# Patient Record
Sex: Male | Born: 1940
Health system: Southern US, Community
[De-identification: ages and names within clinical notes are randomized; demographics above are authoritative.]

## PROBLEM LIST (undated history)

## (undated) DIAGNOSIS — T8859XA Other complications of anesthesia, initial encounter: Secondary | ICD-10-CM

## (undated) DIAGNOSIS — H269 Unspecified cataract: Secondary | ICD-10-CM

## (undated) DIAGNOSIS — H409 Unspecified glaucoma: Secondary | ICD-10-CM

## (undated) DIAGNOSIS — M2540 Effusion, unspecified joint: Secondary | ICD-10-CM

## (undated) DIAGNOSIS — M549 Dorsalgia, unspecified: Secondary | ICD-10-CM

## (undated) DIAGNOSIS — E785 Hyperlipidemia, unspecified: Secondary | ICD-10-CM

## (undated) DIAGNOSIS — M1990 Unspecified osteoarthritis, unspecified site: Secondary | ICD-10-CM

## (undated) DIAGNOSIS — Z860100 Personal history of colon polyps, unspecified: Secondary | ICD-10-CM

## (undated) DIAGNOSIS — Z8639 Personal history of other endocrine, nutritional and metabolic disease: Secondary | ICD-10-CM

## (undated) DIAGNOSIS — R3915 Urgency of urination: Secondary | ICD-10-CM

## (undated) DIAGNOSIS — R350 Frequency of micturition: Secondary | ICD-10-CM

## (undated) DIAGNOSIS — K219 Gastro-esophageal reflux disease without esophagitis: Secondary | ICD-10-CM

## (undated) DIAGNOSIS — Z8614 Personal history of Methicillin resistant Staphylococcus aureus infection: Secondary | ICD-10-CM

## (undated) DIAGNOSIS — I2699 Other pulmonary embolism without acute cor pulmonale: Secondary | ICD-10-CM

## (undated) DIAGNOSIS — G5600 Carpal tunnel syndrome, unspecified upper limb: Secondary | ICD-10-CM

## (undated) DIAGNOSIS — I499 Cardiac arrhythmia, unspecified: Secondary | ICD-10-CM

## (undated) DIAGNOSIS — R351 Nocturia: Secondary | ICD-10-CM

## (undated) DIAGNOSIS — I82409 Acute embolism and thrombosis of unspecified deep veins of unspecified lower extremity: Secondary | ICD-10-CM

## (undated) DIAGNOSIS — K5900 Constipation, unspecified: Secondary | ICD-10-CM

## (undated) DIAGNOSIS — I480 Paroxysmal atrial fibrillation: Secondary | ICD-10-CM

## (undated) DIAGNOSIS — G8929 Other chronic pain: Secondary | ICD-10-CM

## (undated) DIAGNOSIS — C801 Malignant (primary) neoplasm, unspecified: Secondary | ICD-10-CM

## (undated) DIAGNOSIS — I10 Essential (primary) hypertension: Secondary | ICD-10-CM

## (undated) DIAGNOSIS — T4145XA Adverse effect of unspecified anesthetic, initial encounter: Secondary | ICD-10-CM

## (undated) DIAGNOSIS — M2550 Pain in unspecified joint: Secondary | ICD-10-CM

## (undated) DIAGNOSIS — J189 Pneumonia, unspecified organism: Secondary | ICD-10-CM

## (undated) HISTORY — DX: Gastro-esophageal reflux disease without esophagitis: K21.9

## (undated) HISTORY — PX: BACK SURGERY: SHX140

## (undated) HISTORY — DX: Dorsalgia, unspecified: M54.9

## (undated) HISTORY — DX: Essential (primary) hypertension: I10

## (undated) HISTORY — DX: Other chronic pain: G89.29

## (undated) HISTORY — DX: Personal history of other endocrine, nutritional and metabolic disease: Z86.39

## (undated) HISTORY — DX: Paroxysmal atrial fibrillation: I48.0

## (undated) HISTORY — DX: Hyperlipidemia, unspecified: E78.5

## (undated) HISTORY — PX: CARPAL TUNNEL RELEASE: SHX101

## (undated) HISTORY — DX: Unspecified glaucoma: H40.9

## (undated) HISTORY — DX: Acute embolism and thrombosis of unspecified deep veins of unspecified lower extremity: I82.409

## (undated) HISTORY — DX: Carpal tunnel syndrome, unspecified upper limb: G56.00

## (undated) HISTORY — DX: Other pulmonary embolism without acute cor pulmonale: I26.99

## (undated) HISTORY — PX: JOINT REPLACEMENT: SHX530

## (undated) HISTORY — PX: HEMORRHOIDECTOMY WITH HEMORRHOID BANDING: SHX5633

## (undated) HISTORY — PX: LASIK: SHX215

## (undated) HISTORY — PX: KNEE SURGERY: SHX244

## (undated) HISTORY — PX: EYE SURGERY: SHX253

## (undated) HISTORY — PX: KNEE ARTHROSCOPY: SUR90

## (undated) SURGERY — EGD (ESOPHAGOGASTRODUODENOSCOPY)
Anesthesia: Moderate Sedation

---

## 1990-05-18 DIAGNOSIS — Z8614 Personal history of Methicillin resistant Staphylococcus aureus infection: Secondary | ICD-10-CM

## 1990-05-18 HISTORY — DX: Personal history of Methicillin resistant Staphylococcus aureus infection: Z86.14

## 1998-02-18 ENCOUNTER — Encounter: Payer: Self-pay | Admitting: *Deleted

## 1998-02-18 ENCOUNTER — Emergency Department (HOSPITAL_COMMUNITY): Admission: EM | Admit: 1998-02-18 | Discharge: 1998-02-18 | Payer: Self-pay | Admitting: Family Medicine

## 1998-02-20 ENCOUNTER — Ambulatory Visit (HOSPITAL_COMMUNITY): Admission: RE | Admit: 1998-02-20 | Discharge: 1998-02-20 | Payer: Self-pay | Admitting: Neurological Surgery

## 1998-03-06 ENCOUNTER — Encounter: Payer: Self-pay | Admitting: Neurological Surgery

## 1998-03-07 ENCOUNTER — Encounter: Payer: Self-pay | Admitting: Neurological Surgery

## 1998-03-07 ENCOUNTER — Inpatient Hospital Stay (HOSPITAL_COMMUNITY): Admission: RE | Admit: 1998-03-07 | Discharge: 1998-03-08 | Payer: Self-pay | Admitting: Neurological Surgery

## 1999-09-23 ENCOUNTER — Encounter: Payer: Self-pay | Admitting: Neurological Surgery

## 1999-09-25 ENCOUNTER — Encounter: Payer: Self-pay | Admitting: Neurological Surgery

## 1999-09-25 ENCOUNTER — Inpatient Hospital Stay (HOSPITAL_COMMUNITY): Admission: RE | Admit: 1999-09-25 | Discharge: 1999-09-26 | Payer: Self-pay | Admitting: Neurological Surgery

## 1999-10-17 ENCOUNTER — Encounter: Admission: RE | Admit: 1999-10-17 | Discharge: 1999-10-17 | Payer: Self-pay | Admitting: Neurological Surgery

## 1999-10-17 ENCOUNTER — Encounter: Payer: Self-pay | Admitting: Neurological Surgery

## 1999-11-26 ENCOUNTER — Encounter: Payer: Self-pay | Admitting: Neurological Surgery

## 1999-11-26 ENCOUNTER — Encounter: Admission: RE | Admit: 1999-11-26 | Discharge: 1999-11-26 | Payer: Self-pay | Admitting: Neurological Surgery

## 1999-12-31 ENCOUNTER — Encounter: Payer: Self-pay | Admitting: Neurological Surgery

## 1999-12-31 ENCOUNTER — Encounter: Admission: RE | Admit: 1999-12-31 | Discharge: 1999-12-31 | Payer: Self-pay | Admitting: Neurological Surgery

## 2000-07-07 ENCOUNTER — Encounter: Admission: RE | Admit: 2000-07-07 | Discharge: 2000-07-07 | Payer: Self-pay | Admitting: Neurological Surgery

## 2000-07-07 ENCOUNTER — Encounter: Payer: Self-pay | Admitting: Neurological Surgery

## 2000-10-01 ENCOUNTER — Ambulatory Visit (HOSPITAL_COMMUNITY): Admission: RE | Admit: 2000-10-01 | Discharge: 2000-10-01 | Payer: Self-pay | Admitting: Internal Medicine

## 2001-01-07 ENCOUNTER — Ambulatory Visit (HOSPITAL_COMMUNITY): Admission: RE | Admit: 2001-01-07 | Discharge: 2001-01-07 | Payer: Self-pay | Admitting: Family Medicine

## 2001-01-07 ENCOUNTER — Encounter: Payer: Self-pay | Admitting: Family Medicine

## 2001-01-21 ENCOUNTER — Encounter: Payer: Self-pay | Admitting: Family Medicine

## 2001-01-21 ENCOUNTER — Ambulatory Visit (HOSPITAL_COMMUNITY): Admission: RE | Admit: 2001-01-21 | Discharge: 2001-01-21 | Payer: Self-pay | Admitting: Family Medicine

## 2001-01-24 ENCOUNTER — Encounter: Payer: Self-pay | Admitting: Family Medicine

## 2001-01-24 ENCOUNTER — Ambulatory Visit (HOSPITAL_COMMUNITY): Admission: RE | Admit: 2001-01-24 | Discharge: 2001-01-24 | Payer: Self-pay | Admitting: Family Medicine

## 2001-02-17 ENCOUNTER — Encounter: Payer: Self-pay | Admitting: Neurological Surgery

## 2001-02-17 ENCOUNTER — Ambulatory Visit (HOSPITAL_COMMUNITY): Admission: RE | Admit: 2001-02-17 | Discharge: 2001-02-17 | Payer: Self-pay | Admitting: Neurological Surgery

## 2001-08-10 ENCOUNTER — Encounter: Payer: Self-pay | Admitting: Neurological Surgery

## 2001-08-10 ENCOUNTER — Ambulatory Visit (HOSPITAL_COMMUNITY): Admission: RE | Admit: 2001-08-10 | Discharge: 2001-08-10 | Payer: Self-pay | Admitting: Neurological Surgery

## 2001-08-16 ENCOUNTER — Encounter: Payer: Self-pay | Admitting: Neurological Surgery

## 2001-08-16 ENCOUNTER — Ambulatory Visit (HOSPITAL_COMMUNITY): Admission: RE | Admit: 2001-08-16 | Discharge: 2001-08-17 | Payer: Self-pay | Admitting: Neurological Surgery

## 2002-10-18 ENCOUNTER — Other Ambulatory Visit: Admission: RE | Admit: 2002-10-18 | Discharge: 2002-10-18 | Payer: Self-pay | Admitting: Dermatology

## 2003-03-27 ENCOUNTER — Encounter (HOSPITAL_COMMUNITY): Admission: RE | Admit: 2003-03-27 | Discharge: 2003-04-26 | Payer: Self-pay | Admitting: Neurological Surgery

## 2003-07-05 ENCOUNTER — Other Ambulatory Visit: Admission: RE | Admit: 2003-07-05 | Discharge: 2003-07-05 | Payer: Self-pay | Admitting: Dermatology

## 2003-12-20 ENCOUNTER — Other Ambulatory Visit: Admission: RE | Admit: 2003-12-20 | Discharge: 2003-12-20 | Payer: Self-pay | Admitting: Dermatology

## 2004-04-27 ENCOUNTER — Emergency Department (HOSPITAL_COMMUNITY): Admission: EM | Admit: 2004-04-27 | Discharge: 2004-04-28 | Payer: Self-pay | Admitting: Emergency Medicine

## 2004-05-07 ENCOUNTER — Ambulatory Visit: Payer: Self-pay | Admitting: Cardiology

## 2004-05-08 ENCOUNTER — Encounter: Payer: Self-pay | Admitting: Internal Medicine

## 2004-05-08 ENCOUNTER — Ambulatory Visit (HOSPITAL_COMMUNITY): Admission: RE | Admit: 2004-05-08 | Discharge: 2004-05-08 | Payer: Self-pay | Admitting: Cardiology

## 2004-05-08 ENCOUNTER — Ambulatory Visit: Payer: Self-pay | Admitting: Cardiology

## 2004-06-11 ENCOUNTER — Ambulatory Visit: Payer: Self-pay | Admitting: Cardiology

## 2004-10-18 ENCOUNTER — Observation Stay (HOSPITAL_COMMUNITY): Admission: EM | Admit: 2004-10-18 | Discharge: 2004-10-19 | Payer: Self-pay | Admitting: Emergency Medicine

## 2005-01-29 ENCOUNTER — Inpatient Hospital Stay (HOSPITAL_COMMUNITY): Admission: RE | Admit: 2005-01-29 | Discharge: 2005-01-30 | Payer: Self-pay | Admitting: Neurological Surgery

## 2005-05-22 ENCOUNTER — Ambulatory Visit (HOSPITAL_COMMUNITY): Admission: RE | Admit: 2005-05-22 | Discharge: 2005-05-22 | Payer: Self-pay | Admitting: Family Medicine

## 2005-09-01 ENCOUNTER — Inpatient Hospital Stay (HOSPITAL_COMMUNITY): Admission: RE | Admit: 2005-09-01 | Discharge: 2005-09-03 | Payer: Self-pay | Admitting: Neurological Surgery

## 2005-09-16 ENCOUNTER — Encounter: Payer: Self-pay | Admitting: Neurological Surgery

## 2006-04-16 ENCOUNTER — Ambulatory Visit (HOSPITAL_COMMUNITY): Admission: RE | Admit: 2006-04-16 | Discharge: 2006-04-16 | Payer: Self-pay | Admitting: Neurological Surgery

## 2006-05-18 DIAGNOSIS — I2699 Other pulmonary embolism without acute cor pulmonale: Secondary | ICD-10-CM

## 2006-05-18 HISTORY — PX: CARDIAC CATHETERIZATION: SHX172

## 2006-05-18 HISTORY — DX: Other pulmonary embolism without acute cor pulmonale: I26.99

## 2006-05-25 ENCOUNTER — Ambulatory Visit: Payer: Self-pay | Admitting: Cardiology

## 2006-05-25 ENCOUNTER — Inpatient Hospital Stay (HOSPITAL_COMMUNITY): Admission: RE | Admit: 2006-05-25 | Discharge: 2006-05-31 | Payer: Self-pay | Admitting: Neurological Surgery

## 2006-05-25 ENCOUNTER — Ambulatory Visit: Payer: Self-pay | Admitting: Emergency Medicine

## 2006-06-14 ENCOUNTER — Ambulatory Visit: Payer: Self-pay | Admitting: Cardiology

## 2006-09-17 ENCOUNTER — Ambulatory Visit: Payer: Self-pay | Admitting: Cardiology

## 2006-12-06 ENCOUNTER — Ambulatory Visit: Payer: Self-pay | Admitting: Cardiology

## 2006-12-14 ENCOUNTER — Ambulatory Visit: Payer: Self-pay | Admitting: Cardiology

## 2007-02-09 ENCOUNTER — Ambulatory Visit: Payer: Self-pay | Admitting: Cardiology

## 2007-03-19 DIAGNOSIS — I82409 Acute embolism and thrombosis of unspecified deep veins of unspecified lower extremity: Secondary | ICD-10-CM

## 2007-03-19 HISTORY — DX: Acute embolism and thrombosis of unspecified deep veins of unspecified lower extremity: I82.409

## 2007-04-05 ENCOUNTER — Inpatient Hospital Stay (HOSPITAL_COMMUNITY): Admission: EM | Admit: 2007-04-05 | Discharge: 2007-04-08 | Payer: Self-pay | Admitting: Emergency Medicine

## 2007-04-05 ENCOUNTER — Ambulatory Visit: Payer: Self-pay | Admitting: Cardiology

## 2007-04-06 ENCOUNTER — Encounter: Payer: Self-pay | Admitting: Internal Medicine

## 2007-05-25 ENCOUNTER — Encounter: Admission: RE | Admit: 2007-05-25 | Discharge: 2007-05-25 | Payer: Self-pay | Admitting: Neurological Surgery

## 2007-07-18 ENCOUNTER — Emergency Department (HOSPITAL_COMMUNITY): Admission: EM | Admit: 2007-07-18 | Discharge: 2007-07-18 | Payer: Self-pay | Admitting: Emergency Medicine

## 2007-10-20 ENCOUNTER — Ambulatory Visit (HOSPITAL_COMMUNITY): Admission: RE | Admit: 2007-10-20 | Discharge: 2007-10-20 | Payer: Self-pay | Admitting: Family Medicine

## 2009-05-29 ENCOUNTER — Encounter: Payer: Self-pay | Admitting: Internal Medicine

## 2009-06-18 ENCOUNTER — Encounter: Payer: Self-pay | Admitting: Internal Medicine

## 2009-06-20 ENCOUNTER — Encounter: Payer: Self-pay | Admitting: Internal Medicine

## 2009-06-28 ENCOUNTER — Encounter: Payer: Self-pay | Admitting: Internal Medicine

## 2009-11-01 ENCOUNTER — Encounter: Payer: Self-pay | Admitting: Internal Medicine

## 2009-11-26 ENCOUNTER — Ambulatory Visit (HOSPITAL_COMMUNITY): Admission: RE | Admit: 2009-11-26 | Discharge: 2009-11-26 | Payer: Self-pay | Admitting: Family Medicine

## 2009-11-28 ENCOUNTER — Encounter: Payer: Self-pay | Admitting: Internal Medicine

## 2009-12-06 ENCOUNTER — Ambulatory Visit: Payer: Self-pay | Admitting: Internal Medicine

## 2009-12-06 DIAGNOSIS — I4891 Unspecified atrial fibrillation: Secondary | ICD-10-CM | POA: Insufficient documentation

## 2009-12-06 DIAGNOSIS — I10 Essential (primary) hypertension: Secondary | ICD-10-CM | POA: Insufficient documentation

## 2009-12-06 DIAGNOSIS — I4819 Other persistent atrial fibrillation: Secondary | ICD-10-CM | POA: Insufficient documentation

## 2009-12-17 ENCOUNTER — Encounter: Payer: Self-pay | Admitting: Internal Medicine

## 2009-12-23 ENCOUNTER — Encounter: Payer: Self-pay | Admitting: Internal Medicine

## 2009-12-26 ENCOUNTER — Encounter: Payer: Self-pay | Admitting: Internal Medicine

## 2010-01-03 ENCOUNTER — Encounter: Payer: Self-pay | Admitting: Internal Medicine

## 2010-01-10 ENCOUNTER — Encounter: Payer: Self-pay | Admitting: Internal Medicine

## 2010-01-15 ENCOUNTER — Encounter: Payer: Self-pay | Admitting: Internal Medicine

## 2010-01-16 ENCOUNTER — Encounter: Payer: Self-pay | Admitting: Internal Medicine

## 2010-01-23 ENCOUNTER — Encounter: Payer: Self-pay | Admitting: Cardiology

## 2010-01-23 ENCOUNTER — Ambulatory Visit: Payer: Self-pay | Admitting: Cardiology

## 2010-01-23 ENCOUNTER — Ambulatory Visit (HOSPITAL_COMMUNITY): Admission: RE | Admit: 2010-01-23 | Discharge: 2010-01-23 | Payer: Self-pay | Admitting: Cardiology

## 2010-01-24 ENCOUNTER — Ambulatory Visit: Payer: Self-pay | Admitting: Internal Medicine

## 2010-01-24 ENCOUNTER — Ambulatory Visit (HOSPITAL_COMMUNITY): Admission: RE | Admit: 2010-01-24 | Discharge: 2010-01-25 | Payer: Self-pay | Admitting: Internal Medicine

## 2010-01-24 HISTORY — PX: OTHER SURGICAL HISTORY: SHX169

## 2010-03-27 ENCOUNTER — Ambulatory Visit: Payer: Self-pay | Admitting: Internal Medicine

## 2010-04-17 ENCOUNTER — Encounter: Payer: Self-pay | Admitting: Internal Medicine

## 2010-06-10 ENCOUNTER — Ambulatory Visit (HOSPITAL_COMMUNITY)
Admission: RE | Admit: 2010-06-10 | Discharge: 2010-06-10 | Payer: Self-pay | Source: Home / Self Care | Attending: Family Medicine | Admitting: Family Medicine

## 2010-06-17 ENCOUNTER — Ambulatory Visit (HOSPITAL_COMMUNITY)
Admission: RE | Admit: 2010-06-17 | Discharge: 2010-06-17 | Payer: Self-pay | Source: Home / Self Care | Attending: Family Medicine | Admitting: Family Medicine

## 2010-06-19 NOTE — Medication Information (Signed)
Summary: [facility] Medicine Physician Orders/INR Results   Physician Orders/INR Results   Imported By: Roderic Ovens 01/03/2010 10:41:11  _____________________________________________________________________  External Attachment:    Type:   Image     Comment:   External Document

## 2010-06-19 NOTE — Letter (Signed)
Summary: [facility]   [facility]   Imported By: Roderic Ovens 12/24/2009 15:33:30  _____________________________________________________________________  External Attachment:    Type:   Image     Comment:   External Document

## 2010-06-19 NOTE — Assessment & Plan Note (Signed)
Summary: eph/s/p rfca in 8 weeks/mt   Visit Type:  Follow-up Referring Provider:  Dr Caprice Kluver Primary Provider:  Dr Lilyan Punt   History of Present Illness: The patient presents today for routine electrophysiology followup. He reports doing very well since his afib ablation.  He denies any further episodes of symptomatic afib.  He is unaware of postprocedure complications.  The patient denies symptoms of palpitations, chest pain, shortness of breath, orthopnea, PND, lower extremity edema, dizziness, presyncope, syncope, or neurologic sequela.  He has occasional "nervousness" which he attributes to caffeine.  The patient is tolerating medications without difficulties and is otherwise without complaint today.   Current Medications (verified): 1)  Ramipril 10 Mg Caps (Ramipril) .... Take One Capsule By Mouth Two Times A Day 2)  Warfarin Sodium 5 Mg Tabs (Warfarin Sodium) .... Use As Directed 3)  Sotalol Hcl 80 Mg Tabs (Sotalol Hcl) .... Take 1  Tablet By Mouth Twice A Day 4)  Indapamide 1.25 Mg Tabs (Indapamide) .... Take One Tablet By Mouth Once Daily. 5)  Oxycodone Hcl 10 Mg Tabs (Oxycodone Hcl) .... Uad  Allergies (verified): 1)  ! Sulfa  Past History:  Past Medical History:  1. Paroxysmal atrial fibrillation s/p PVI 9/11  2. He also has a history of hyperglycemia.   3. Hyperlipidemia.   4. Hypertension.   5. Chronic back pain with numerous back surgeries.   6. GERD  7. Anxiety  8. DVT 03/2007 and PTE  Past Surgical History: cervical and lumbar surgery L knee surgery S/p PVI 9/11  Social History: Reviewed history from 12/06/2009 and no changes required. Pt lives in [place] with spouse.  He does not smoke; he quit over 30 years ago. He has a h/o heavy ETOH but now rarely drinks.  He is married.  He does have children.  He is retired.      Vital Signs:  Patient profile:   70 year old male Height:      66 inches Weight:      197 pounds BMI:     31.91 Pulse rate:    56 / minute BP sitting:   140 / 90  (left arm)  Vitals Entered By: Laurance Flatten CMA (March 27, 2010 12:02 PM)  Physical Exam  General:  Well developed, well nourished, in no acute distress. Head:  normocephalic and atraumatic Eyes:  PERRLA/EOM intact; conjunctiva and lids normal. Mouth:  Teeth, gums and palate normal. Oral mucosa normal. Neck:  Neck supple, no JVD. No masses, thyromegaly or abnormal cervical nodes. Lungs:  Clear bilaterally to auscultation and percussion. Heart:  Non-displaced PMI, chest non-tender; regular rate and rhythm, S1, S2 without murmurs, rubs or gallops. Carotid upstroke normal, no bruit. Normal abdominal aortic size, no bruits. Femorals normal pulses, no bruits. Pedals normal pulses. No edema, no varicosities. Abdomen:  Bowel sounds positive; abdomen soft and non-tender without masses, organomegaly, or hernias noted. No hepatosplenomegaly. Msk:  Back normal, normal gait. Muscle strength and tone normal. Pulses:  pulses normal in all 4 extremities Extremities:  No clubbing or cyanosis. Neurologic:  Alert and oriented x 3.   EKG  Procedure date:  03/27/2010  Findings:      sinus bradycardia 56 bpm, PR 172., QTc 424   Impression & Recommendations:  Problem # 1:  ATRIAL FIBRILLATION, PAROXYSMAL (ICD-427.31) doing very well s/p afib ablation without recurrence or procedure related problems.  He is pleased with results thus far. The importance of avoidance of ETOH and significant caffeine were stressed  today. Continue coumadin and sotalol for now.  We will consider stopping sotalol if he has no further afib upon return.  Problem # 2:  HYPERTENSION, BENIGN (ICD-401.1) stable no changes today  Patient Instructions: 1)  Your physician recommends that you schedule a follow-up appointment in: 3 months with Dr Johney Frame 2)  Your physician recommends that you continue on your current medications as directed. Please refer to the Current Medication list given to  you today.

## 2010-06-19 NOTE — Assessment & Plan Note (Signed)
Summary: nep/afib/jml   Visit Type:  Initial Consult Referring Provider:  Dr Caprice Kluver Primary Provider:  Dr Lilyan Punt  CC:  afib.  History of Present Illness: [name] is a pleasant 70 yo WM with a h/o paroxysmal atrial fibrillation who presents today for EP consultation.  He reports initially being diagnosed with atrial fibrillation 2008 after back surgery (but feels that he has likely had afib for months before).  He states at that time that he would have episodes of tachycardia related to ETOH use.  He has not quit drinking heavy ETOH.  Despite ETOH cessation, he reports increasing frequency and duration of atrial fibrillation.  He has been followed by Dr Clarene Duke and has been treated with coumadin.  He takes Sotalol 80mg  two times a day.  He has previously tried metoprolol.  Despite medical therapy, he reports episodes of afib  1-2times per week.  Episodes typically last less than 24 hours but may last several days.  He has not previously required cardioversion.  During afib, he reports tachy palpitations, head "pounding", fatigue, and decreased exercise tolerance. The patient denies symptoms of chest pain, shortness of breath, orthopnea, PND, lower extremity edema, dizziness, presyncope, syncope, or neurologic sequela. The patient is tolerating medications without difficulties and is otherwise without complaint today.    Allergies (verified): No Known Drug Allergies  Past History:  Family History: Last updated: 12/06/2009   Hypertension and heart disease, but not of  hypercoagulability  Social History: Last updated: 12/06/2009 Pt lives in [place] with spouse.  He does not smoke; he quit over 30 years ago. He has a h/o heavy ETOH but now rarely drinks.  He is married.  He does have children.  He is retired.      Past Medical History:  1. Paroxysmal atrial fibrillation.   2. He also has a history of hyperglycemia.   3. Hyperlipidemia.   4. Hypertension.   5. Chronic back pain  with numerous back surgeries.   6. GERD  7. Anxiety  8. DVT 03/2007 and PTE  Past Surgical History: cervical and lumbar surgery L knee surgery  Family History:   Hypertension and heart disease, but not of  hypercoagulability  Social History: Pt lives in [place] with spouse.  He does not smoke; he quit over 30 years ago. He has a h/o heavy ETOH but now rarely drinks.  He is married.  He does have children.  He is retired.      Review of Systems       All systems are reviewed and negative except as listed in the HPI.   Vital Signs:  Patient profile:   70 year old male Height:      66 inches Weight:      196 pounds BMI:     31.75 Pulse rate:   55 / minute BP sitting:   152 / 80  (left arm)  Vitals Entered By: Laurance Flatten CMA (December 06, 2009 11:40 AM)  Physical Exam  General:  Well developed, well nourished, in no acute distress. Head:  normocephalic and atraumatic Eyes:  PERRLA/EOM intact; conjunctiva and lids normal. Mouth:  Teeth, gums and palate normal. Oral mucosa normal. Neck:  Neck supple, no JVD. No masses, thyromegaly or abnormal cervical nodes. Lungs:  Clear bilaterally to auscultation and percussion. Heart:  Non-displaced PMI, chest non-tender; regular rate and rhythm, S1, S2 without murmurs, rubs or gallops. Carotid upstroke normal, no bruit. Normal abdominal aortic size, no bruits. Femorals normal pulses, no  bruits. Pedals normal pulses. No edema, no varicosities. Abdomen:  Bowel sounds positive; abdomen soft and non-tender without masses, organomegaly, or hernias noted. No hepatosplenomegaly. Msk:  Back normal, normal gait. Muscle strength and tone normal. Pulses:  pulses normal in all 4 extremities Extremities:  No clubbing or cyanosis. Neurologic:  Alert and oriented x 3. Skin:  Intact without lesions or rashes. Cervical Nodes:  no significant adenopathy Psych:  Normal affect.   EKG  Procedure date:  12/06/2009  Findings:      sinus bradycardia 55  bpm,k PR 178, Qtc 380, during rhythm strip, the patient has salvos of afib and nonsustained atria tachycardia as well as a more regular tachycardia which interestingly ends in an 'A' not 'V'.   Echocardiogram  Procedure date:  11/28/2009  Findings:      LVEF >55%, nl LV wall motion,  LA size 41mm, nl RA size, mild to moderate [name], mild TR  Event Monitor  Procedure date:  12/05/2009  Findings:      predominant rhythm is sinus nonsustained afib and atrial tachycardia are observed  Comments:      monitor placed by Dr Clarene Duke 7/14-7/21/11   Nuclear Study  Procedure date:  06/20/2009  Findings:      Persantine Myoview performed by Dr Mindi Junker Little  EF 62%,  diaphragmatic attenuation, low risk study  Impression & Recommendations:  Problem # 1:  ATRIAL FIBRILLATION, PAROXYSMAL (ICD-427.31) The patient has very symptomatic paroxysmal atrial fibrillation.  He is having salvos of atrial tachycardia and nonsustained afib in the office today.  He has failed medical therapy with both metoprolol and sotalol.  He is chronically anticoagulated with coumadin.  His CHADS2 score is 1, however, he also has a h/o DVT and PTE in 2008. Therapeutic strategies for afib including medicine and ablation were discussed in detail with the patient today. Risk, benefits, and alternatives to EP study and radiofrequency ablation for afib were also discussed in detail today. These risks include but are not limited to stroke, bleeding, vascular damage, tamponade, perforation, damage to the esophagus, lungs, and other structures, pulmonary vein stenosis, worsening renal function, and death. The patient understands these risk and wishes to proceed.  We will schedule afib ablation at the next available time.  No medicine changes are made today.  Problem # 2:  HYPERTENSION, BENIGN (ICD-401.1) stable no changes today

## 2010-06-19 NOTE — Letter (Signed)
Summary: ELectrophysiology/Ablation Procedure Instructions  [facility]  [address]   [place], [place] [address]   Phone: [phone]  Fax: [phone]     Electrophysiology/Ablation Procedure Instructions    You are scheduled for a(n) afib ablation on 01/24/10 at 7:30am with Dr. Johney Frame.  1.  Please come to the Short Stay Center at [facility] at 5:30am on the day of your procedure.  2.  Come prepared to stay overnight.   Please bring your insurance cards and a list of your medications.  3.  Come to the Dr Fletcher Anon office on 01/16/10 for lab work.    You do not have to be fasting.  4.  Do not have anything to eat or drink after midnight the night before your procedure.  5.    All of your remaining medications may be taken with a small amount of water.  6.  Educational material received:   Ablation   * Occasionally, EP studies and ablations can become lengthy.  Please make your family aware of this before your procedure starts.  Average time ranges from 2-8 hours for EP studies/ablations.  Your physician will locate your family after the procedure with the results.  * If you have any questions after you get home, please call the office at [phone]. [name]   TEE on 01/23/10 with Dr Jens Som Be at hospital at 10:00 at [facility] Nothing to eat or drink after midnight.  TEE is at 11:00

## 2010-06-19 NOTE — Progress Notes (Signed)
Summary: [facility] Note   [facility]   Imported By: Roderic Ovens 12/24/2009 15:28:09  _____________________________________________________________________  External Attachment:    Type:   Image     Comment:   External Document

## 2010-06-20 NOTE — Letter (Signed)
Summary: [facility] Note   [facility] Note   Imported By: Roderic Ovens 02/07/2010 14:36:27  _____________________________________________________________________  External Attachment:    Type:   Image     Comment:   External Document

## 2010-06-20 NOTE — Letter (Signed)
Summary: [facility] Note   [facility] Note   Imported By: Roderic Ovens 12/24/2009 15:29:58  _____________________________________________________________________  External Attachment:    Type:   Image     Comment:   External Document

## 2010-06-20 NOTE — Letter (Signed)
Summary: [facility] Note   [facility] Note   Imported By: Roderic Ovens 12/24/2009 15:30:17  _____________________________________________________________________  External Attachment:    Type:   Image     Comment:   External Document

## 2010-06-25 ENCOUNTER — Encounter: Payer: Self-pay | Admitting: Internal Medicine

## 2010-06-25 ENCOUNTER — Ambulatory Visit (INDEPENDENT_AMBULATORY_CARE_PROVIDER_SITE_OTHER): Payer: Medicare Other | Admitting: Internal Medicine

## 2010-06-25 DIAGNOSIS — I4891 Unspecified atrial fibrillation: Secondary | ICD-10-CM

## 2010-06-25 DIAGNOSIS — I10 Essential (primary) hypertension: Secondary | ICD-10-CM

## 2010-06-25 NOTE — Letter (Signed)
Summary: [facility] Office Note   [facility] Office Note   Imported By: Roderic Ovens 06/19/2010 12:07:15  _____________________________________________________________________  External Attachment:    Type:   Image     Comment:   External Document

## 2010-07-03 NOTE — Assessment & Plan Note (Signed)
Summary: f50m/rov./sl/jml   Visit Type:  Follow-up Referring Provider:  Dr Caprice Kluver Primary Provider:  Dr Lilyan Punt   History of Present Illness: The patient presents today for routine electrophysiology followup. He reports doing very well since his afib ablation.  He denies any further episodes of symptomatic afib.  The patient denies symptoms of palpitations, shortness of breath, orthopnea, PND, lower extremity edema, dizziness, presyncope, syncope, or neurologic sequela.   He reports that in mid December that he had a "forceful" heart beat with chest pain lasting only 20 seconds.  He has had no further episodes and has done very well since.   The patient is tolerating medications without difficulties and is otherwise without complaint today.   Current Medications (verified): 1)  Ramipril 10 Mg Caps (Ramipril) .... Take One Capsule By Mouth Two Times A Day 2)  Warfarin Sodium 5 Mg Tabs (Warfarin Sodium) .... Use As Directed 3)  Sotalol Hcl 80 Mg Tabs (Sotalol Hcl) .... Take 1  Tablet By Mouth Twice A Day 4)  Indapamide 1.25 Mg Tabs (Indapamide) .... Take One Tablet By Mouth Once Daily. 5)  Oxycodone Hcl 10 Mg Tabs (Oxycodone Hcl) .... Uad 6)  Pravastatin Sodium 20 Mg Tabs (Pravastatin Sodium) .... Take One Tablet By Mouth Daily At Bedtime  Allergies: 1)  ! Sulfa  Past History:  Past Medical History: Reviewed history from 03/27/2010 and no changes required.  1. Paroxysmal atrial fibrillation s/p PVI 9/11  2. He also has a history of hyperglycemia.   3. Hyperlipidemia.   4. Hypertension.   5. Chronic back pain with numerous back surgeries.   6. GERD  7. Anxiety  8. DVT 03/2007 and PTE  Past Surgical History: Reviewed history from 03/27/2010 and no changes required. cervical and lumbar surgery L knee surgery S/p PVI 9/11  Social History: Reviewed history from 12/06/2009 and no changes required. Pt lives in [place] with spouse.  He does not smoke; he quit over 30 years  ago. He has a h/o heavy ETOH but now rarely drinks.  He is married.  He does have children.  He is retired.      Review of Systems       All systems are reviewed and negative except as listed in the HPI.   Vital Signs:  Patient profile:   70 year old male Height:      66 inches Weight:      201 pounds BMI:     32.56 Pulse rate:   63 / minute BP sitting:   150 / 102  (left arm)  Vitals Entered By: Laurance Flatten CMA (June 25, 2010 11:36 AM)  Physical Exam  General:  Well developed, well nourished, in no acute distress. Head:  normocephalic and atraumatic Eyes:  PERRLA/EOM intact; conjunctiva and lids normal. Mouth:  Teeth, gums and palate normal. Oral mucosa normal. Neck:  supple Lungs:  Clear bilaterally to auscultation and percussion. Heart:  Non-displaced PMI, chest non-tender; regular rate and rhythm, S1, S2 without murmurs, rubs or gallops. Carotid upstroke normal, no bruit. Normal abdominal aortic size, no bruits. Femorals normal pulses, no bruits. Pedals normal pulses. No edema, no varicosities. Abdomen:  Bowel sounds positive; abdomen soft and non-tender without masses, organomegaly, or hernias noted. No hepatosplenomegaly. Msk:  Back normal, normal gait. Muscle strength and tone normal. Extremities:  No clubbing or cyanosis. Neurologic:  Alert and oriented x 3. Skin:  Intact without lesions or rashes. Psych:  Normal affect.   EKG  Procedure  date:  06/25/2010  Findings:      sinus rhythm 63 bpm, otherwise normal ekg  Impression & Recommendations:  Problem # 1:  ATRIAL FIBRILLATION, PAROXYSMAL (ICD-427.31) doing very well s/p ablation without recurrence or procedure related problems.   The importance of avoidance of ETOH and significant caffeine were stressed today. We will stop sotalol at this time. We also had a long discussion about anticoagulation.  His CHADS2 score is 1 (HTN) however he also has a h/o DVT/PTE.  I would therefore advised that he remain on  anticoagulation longterm with either coumadin or pradaxa.  He will investigate costs of pradaxa.  He could probably stop coumadin for back surgery/ procedures as needed without bridging, but should return to coumadin/ pradaxa afterwards.  Problem # 2:  HYPERTENSION, BENIGN (ICD-401.1) above goal salt restriction follow-up with Dr Clarene Duke  Patient Instructions: 1)  Your physician recommends that you schedule a follow-up appointment in: 3 months. 2)  Your physician has recommended you make the following change in your medication: stop  Sotalol Hcl 80 Mg Tabs.

## 2010-07-08 ENCOUNTER — Telehealth: Payer: Self-pay | Admitting: Internal Medicine

## 2010-07-15 NOTE — Progress Notes (Signed)
Summary: pharmacy having a hard time getting an answer- praxad  Phone Note Call from Patient Call back at Home Phone [phone]   Caller: [phone] Reason for Call: Talk to Nurse Summary of Call: per pt calling, was told to have pharmacy to called over the praxada. pharmacy is telling pt he/she is having a hard time getting an answer.  Initial call taken by: Lorne Skeens,  July 08, 2010 9:15 AM    New/Updated Medications: PRADAXA 150 MG CAPS (DABIGATRAN ETEXILATE MESYLATE) 1 capsule two times a day Prescriptions: PRADAXA 150 MG CAPS (DABIGATRAN ETEXILATE MESYLATE) 1 capsule two times a day  #60 x 6   Entered by:   Laurance Flatten CMA   Authorized by:   Hillis Range, MD   Signed by:   Laurance Flatten CMA on 07/08/2010   Method used:   Electronically to        [facility]. [phone]* (retail)       [address]       [place], [place]  [address]       Ph: [phone]       Fax: [phone]   RxID:   [id number]

## 2010-07-18 ENCOUNTER — Other Ambulatory Visit (HOSPITAL_COMMUNITY): Payer: Self-pay | Admitting: Neurological Surgery

## 2010-07-18 DIAGNOSIS — M545 Low back pain: Secondary | ICD-10-CM

## 2010-07-18 DIAGNOSIS — M546 Pain in thoracic spine: Secondary | ICD-10-CM

## 2010-07-18 DIAGNOSIS — M542 Cervicalgia: Secondary | ICD-10-CM

## 2010-07-28 ENCOUNTER — Telehealth: Payer: Self-pay | Admitting: Internal Medicine

## 2010-07-31 LAB — PROTIME-INR
INR: 2.46 — ABNORMAL HIGH (ref 0.00–1.49)
INR: 2.68 — ABNORMAL HIGH (ref 0.00–1.49)
INR: 2.81 — ABNORMAL HIGH (ref 0.00–1.49)
Prothrombin Time: 26.8 seconds — ABNORMAL HIGH (ref 11.6–15.2)
Prothrombin Time: 28.6 seconds — ABNORMAL HIGH (ref 11.6–15.2)
Prothrombin Time: 29.7 seconds — ABNORMAL HIGH (ref 11.6–15.2)

## 2010-07-31 LAB — APTT: aPTT: 38 seconds — ABNORMAL HIGH (ref 24–37)

## 2010-07-31 LAB — MRSA PCR SCREENING: MRSA by PCR: NEGATIVE

## 2010-08-06 ENCOUNTER — Telehealth: Payer: Self-pay | Admitting: Internal Medicine

## 2010-08-06 NOTE — Telephone Encounter (Signed)
Spoke with pt and pt's wife They have gotten a letter from Dr Danielle Dess  They want him to stop Pradaxa for 7 days prior to myelogram.  Dr Johney Frame had approved coming off for 5 days.  Is due to have the procedure done on 08/20/10 at 9:00am.  Suppose to stop Pradaxa 3/28 per Dr Verlee Rossetti request. Dr Johney Frame wanted him to take through 08/15/10 and restart on 08/21/10. I will call patient and let her know if Dr Ladona Ridgel thinks this will be ok.  Dr Johney Frame is not in the office

## 2010-08-12 NOTE — Telephone Encounter (Signed)
Discussed with Dr Johney Frame okay to hold for 7 days then restart 36 hours after procedure 08/21/10 in the pm  Pt has concerns because he does not want to have a stroke.  I told him he would have to make the decision and if he did not want to do it then he will have to cancel the procedure

## 2010-08-12 NOTE — Telephone Encounter (Signed)
Pt is calling re pradaxa pt would like to speak with a nurse. PT# [phone]

## 2010-08-14 NOTE — Progress Notes (Signed)
Summary: question re meds  Phone Note Call from Patient Call back at Home Phone [phone]   Caller: Patient Reason for Call: Talk to Nurse Summary of Call: pt is having procedure done on 08/20/10 pt has question re meds. Initial call taken by: Roe Coombs,  July 28, 2010 11:59 AM  Follow-up for Phone Call        lmom for pt to call me back Dennis Bast, RN, BSN  July 28, 2010 3:28 PM Dr Theophilus Bones is going to look into back on 08/20/10 using dye  Needs to be off Pradaxa for 4 days prior BP 175/95  141/80  75-90  He hurts in his back all the time.  What can we do to help his BP and is it okay to stop for 4 days prior/ Dennis Bast, RN, BSN  July 28, 2010 3:51 PM [phone] call back on call Follow-up by: Hillis Range, MD,  July 30, 2010 8:54 PM  Additional Follow-up for Phone Call Additional follow up Details #1::        stop pradaxa 4 days prior to the procedure and restart 36 hours after the procedure.  He should discuss BP with Dr Clarene Duke. Additional Follow-up by: Hillis Range, MD,  July 30, 2010 8:54 PM    Additional Follow-up for Phone Call Additional follow up Details #2::    pt aware about the Pradaxa and to call Dr Clarene Duke about his BP Dennis Bast, RN, BSN  August 04, 2010 4:52 PM  ..[Prescriptions]

## 2010-08-20 ENCOUNTER — Ambulatory Visit
Admission: RE | Admit: 2010-08-20 | Discharge: 2010-08-20 | Disposition: A | Discharge status: Home / Self Care | Payer: Medicare Other | Source: Ambulatory Visit | Attending: Neurological Surgery | Admitting: Neurological Surgery

## 2010-08-20 ENCOUNTER — Ambulatory Visit (HOSPITAL_COMMUNITY)
Admission: RE | Admit: 2010-08-20 | Discharge: 2010-08-20 | Disposition: A | Discharge status: Home / Self Care | Payer: Medicare Other | Source: Ambulatory Visit | Attending: Neurological Surgery | Admitting: Neurological Surgery

## 2010-08-20 ENCOUNTER — Ambulatory Visit (HOSPITAL_COMMUNITY): Admission: RE | Admit: 2010-08-20 | Payer: Medicare Other | Source: Ambulatory Visit

## 2010-08-20 ENCOUNTER — Other Ambulatory Visit (HOSPITAL_COMMUNITY): Payer: Medicare Other

## 2010-08-20 ENCOUNTER — Ambulatory Visit (HOSPITAL_COMMUNITY): Payer: Medicare Other

## 2010-08-20 ENCOUNTER — Other Ambulatory Visit (HOSPITAL_COMMUNITY): Payer: Self-pay | Admitting: Neurological Surgery

## 2010-08-20 DIAGNOSIS — M545 Low back pain, unspecified: Secondary | ICD-10-CM

## 2010-08-20 DIAGNOSIS — M542 Cervicalgia: Secondary | ICD-10-CM

## 2010-08-20 DIAGNOSIS — M48061 Spinal stenosis, lumbar region without neurogenic claudication: Secondary | ICD-10-CM | POA: Insufficient documentation

## 2010-08-20 DIAGNOSIS — M546 Pain in thoracic spine: Secondary | ICD-10-CM

## 2010-08-20 DIAGNOSIS — M4000 Postural kyphosis, site unspecified: Secondary | ICD-10-CM | POA: Insufficient documentation

## 2010-08-20 DIAGNOSIS — M4804 Spinal stenosis, thoracic region: Secondary | ICD-10-CM | POA: Insufficient documentation

## 2010-08-20 MED ORDER — IOHEXOL 300 MG/ML  SOLN
10.0000 mL | Freq: Once | INTRAMUSCULAR | Status: AC | PRN
  Administered 2010-08-20: 10 mL via INTRATHECAL

## 2010-09-02 ENCOUNTER — Telehealth: Payer: Self-pay | Admitting: Internal Medicine

## 2010-09-02 NOTE — Telephone Encounter (Signed)
Pt calling stating he is to have back surgery with dr Danielle Dess on 09/18/10--no one has called him about going off his meds pre-op in preparation for surgery,nor does he have a surgical clearance from us--pt has appoint with dr Danielle Dess on 4/26 for discussion of procedure, and this would be a good day to see him for us--pt also taking pradaxa and needs to be stopped for surgery--nt

## 2010-09-02 NOTE — Telephone Encounter (Signed)
Pt would to talk to [facility] re his back surgery Sep 18, 2010. Pt has question re his meds as well.

## 2010-09-04 NOTE — Telephone Encounter (Signed)
Once pt sees surgeon they will send [place] a note to be cleared and we will clear him  Wife aware

## 2010-09-11 ENCOUNTER — Encounter (HOSPITAL_COMMUNITY)
Admission: RE | Admit: 2010-09-11 | Discharge: 2010-09-11 | Disposition: A | Discharge status: Home / Self Care | Payer: Medicare Other | Source: Ambulatory Visit | Attending: Neurological Surgery | Admitting: Neurological Surgery

## 2010-09-11 LAB — CBC
HCT: 45.4 % (ref 39.0–52.0)
Hemoglobin: 15.3 g/dL (ref 13.0–17.0)
MCH: 30.5 pg (ref 26.0–34.0)
MCHC: 33.7 g/dL (ref 30.0–36.0)
MCV: 90.4 fL (ref 78.0–100.0)
Platelets: 235 10*3/uL (ref 150–400)
RBC: 5.02 MIL/uL (ref 4.22–5.81)
RDW: 13 % (ref 11.5–15.5)
WBC: 6.8 10*3/uL (ref 4.0–10.5)

## 2010-09-11 LAB — BASIC METABOLIC PANEL
BUN: 13 mg/dL (ref 6–23)
CO2: 30 mEq/L (ref 19–32)
Calcium: 10 mg/dL (ref 8.4–10.5)
Chloride: 101 mEq/L (ref 96–112)
Creatinine, Ser: 1.01 mg/dL (ref 0.4–1.5)
GFR calc Af Amer: >60 mL/min (ref >60)
GFR calc non Af Amer: >60 mL/min (ref >60)
Glucose, Bld: 89 mg/dL (ref 70–99)
Potassium: 4.5 mEq/L (ref 3.5–5.1)
Sodium: 138 mEq/L (ref 135–145)

## 2010-09-11 LAB — SURGICAL PCR SCREEN
MRSA, PCR: NEGATIVE
Staphylococcus aureus: POSITIVE — AB

## 2010-09-18 ENCOUNTER — Inpatient Hospital Stay (HOSPITAL_COMMUNITY)
Admission: RE | Admit: 2010-09-18 | Discharge: 2010-09-21 | DRG: 460 | Disposition: A | Discharge status: Home / Self Care | Payer: Medicare Other | Source: Ambulatory Visit | Attending: Neurological Surgery | Admitting: Neurological Surgery

## 2010-09-18 ENCOUNTER — Inpatient Hospital Stay (HOSPITAL_COMMUNITY): Payer: Medicare Other

## 2010-09-18 DIAGNOSIS — M5126 Other intervertebral disc displacement, lumbar region: Secondary | ICD-10-CM | POA: Diagnosis present

## 2010-09-18 DIAGNOSIS — M47817 Spondylosis without myelopathy or radiculopathy, lumbosacral region: Principal | ICD-10-CM | POA: Diagnosis present

## 2010-09-18 DIAGNOSIS — I10 Essential (primary) hypertension: Secondary | ICD-10-CM | POA: Diagnosis present

## 2010-09-18 DIAGNOSIS — Z01812 Encounter for preprocedural laboratory examination: Secondary | ICD-10-CM

## 2010-09-18 LAB — APTT: aPTT: 29 seconds (ref 24–37)

## 2010-09-18 LAB — PROTIME-INR
INR: 1.12 (ref 0.00–1.49)
Prothrombin Time: 14.6 seconds (ref 11.6–15.2)

## 2010-09-18 LAB — TYPE AND SCREEN
ABO/RH(D): A POS
Antibody Screen: NEGATIVE

## 2010-09-19 LAB — CBC
HCT: 37.9 % — ABNORMAL LOW (ref 39.0–52.0)
Hemoglobin: 12.7 g/dL — ABNORMAL LOW (ref 13.0–17.0)
MCH: 30.5 pg (ref 26.0–34.0)
MCHC: 33.5 g/dL (ref 30.0–36.0)
MCV: 90.9 fL (ref 78.0–100.0)
Platelets: 188 10*3/uL (ref 150–400)
RBC: 4.17 MIL/uL — ABNORMAL LOW (ref 4.22–5.81)
RDW: 13 % (ref 11.5–15.5)
WBC: 9.8 10*3/uL (ref 4.0–10.5)

## 2010-09-19 LAB — BASIC METABOLIC PANEL
BUN: 15 mg/dL (ref 6–23)
CO2: 34 mEq/L — ABNORMAL HIGH (ref 19–32)
Calcium: 8.9 mg/dL (ref 8.4–10.5)
Chloride: 100 mEq/L (ref 96–112)
Creatinine, Ser: 1.09 mg/dL (ref 0.4–1.5)
GFR calc Af Amer: >60 mL/min (ref >60)
GFR calc non Af Amer: >60 mL/min (ref >60)
Glucose, Bld: 156 mg/dL — ABNORMAL HIGH (ref 70–99)
Potassium: 4.2 mEq/L (ref 3.5–5.1)
Sodium: 138 mEq/L (ref 135–145)

## 2010-09-25 ENCOUNTER — Encounter: Payer: Self-pay | Admitting: Internal Medicine

## 2010-09-29 ENCOUNTER — Ambulatory Visit (INDEPENDENT_AMBULATORY_CARE_PROVIDER_SITE_OTHER): Payer: Medicare Other | Admitting: Internal Medicine

## 2010-09-29 ENCOUNTER — Encounter: Payer: Self-pay | Admitting: Internal Medicine

## 2010-09-29 DIAGNOSIS — I10 Essential (primary) hypertension: Secondary | ICD-10-CM

## 2010-09-29 DIAGNOSIS — I4891 Unspecified atrial fibrillation: Secondary | ICD-10-CM

## 2010-09-29 NOTE — Progress Notes (Signed)
The patient presents today for routine electrophysiology followup.  Since last being seen in our clinic, the patient reports doing very well.  He is unaware of any further atrial fibrillation.  He recently underwent back surgery and continues to make a slow recovery from this.  He is tolerating pradaxa without difficulty. Today, he denies symptoms of palpitations, chest pain, shortness of breath, orthopnea, PND, lower extremity edema, dizziness, presyncope, syncope, or neurologic sequela.  The patient feels that he is tolerating medications without difficulties and is otherwise without complaint today.   Past Medical History  Diagnosis Date  . Paroxysmal atrial fibrillation     s/p afib ablation 01/24/10  . History of hyperglycemia   . Hyperlipidemia   . HTN (hypertension)   . Chronic back pain     multiple back surgeries  . GERD (gastroesophageal reflux disease)   . Anxiety   . DVT (deep venous thrombosis) 03/2007    and PTE   Past Surgical History  Procedure Date  . Cervical and lumbar surgery   . Knee surgery     left  . Pvi 01/24/10    afib ablation    Current Outpatient Prescriptions  Medication Sig Dispense Refill  . dabigatran (PRADAXA) 150 MG CAPS Take 150 mg by mouth 2 (two) times daily.        . diazepam (VALIUM) 5 MG tablet Take 5 mg by mouth as directed.        . indapamide (LOZOL) 1.25 MG tablet Take 1.25 mg by mouth daily.        . Oxycodone HCl 10 MG TABS Take by mouth. UAD       . pravastatin (PRAVACHOL) 20 MG tablet Take 20 mg by mouth at bedtime.        . ramipril (ALTACE) 10 MG capsule Take 10 mg by mouth 2 (two) times daily.        Marland Kitchen DISCONTD: warfarin (COUMADIN) 5 MG tablet UAD         Allergies  Allergen Reactions  . Sulfonamide Derivatives     History   Social History  . Marital Status: Married    Spouse Name: N/A    Number of Children: N/A  . Years of Education: N/A   Occupational History  . retired    Social History Main Topics  . Smoking  status: Former Games developer  . Smokeless tobacco: Not on file   Comment: quit over 30 years ago  . Alcohol Use: Yes     has a h/o heavy ETOH but now rarely drinks  . Drug Use: No  . Sexually Active: Not on file   Other Topics Concern  . Not on file   Social History Narrative  . No narrative on file    Family History  Problem Relation Age of Onset  . Hypertension    . Heart disease     Physical Exam: Filed Vitals:   09/29/10 1118  BP: 130/80  Pulse: 77  Height: 5\' 6"  (1.676 m)  Weight: 195 lb (88.451 kg)    GEN- The patient is well appearing, alert and oriented x 3 today.   Head- normocephalic, atraumatic Eyes-  Sclera clear, conjunctiva pink Ears- hearing intact Oropharynx- clear Neck- supple, no JVP Lymph- no cervical lymphadenopathy Lungs- Clear to ausculation bilaterally, normal work of breathing Heart- Regular rate and rhythm, no murmurs, rubs or gallops, PMI not laterally displaced GI- soft, NT, ND, + BS Extremities- no clubbing, cyanosis, or edema MS- back brace in place,  walks slowly today with a cane Skin- no rash or lesion Psych- euthymic mood, full affect Neuro- strength and sensation are intact  Assessment and Plan:

## 2010-09-29 NOTE — Patient Instructions (Signed)
Your physician wants you to follow-up in:  6 months. You will receive a reminder letter in the mail two months in advance. If you don't receive a letter, please call our office to schedule the follow-up appointment.   

## 2010-09-29 NOTE — Assessment & Plan Note (Signed)
Stable No change required today  

## 2010-09-29 NOTE — Assessment & Plan Note (Signed)
Doing very since his afib ablation, without recurrence off of sotalol. Continue pradaxa at this time. He has a h/o DVT/PTE and therefore may require longterm anticoagulation.

## 2010-09-30 NOTE — Letter (Signed)
December 06, 2006    [name] A. Gerda Diss, MD  [address], [address]  [place], [place] [address]   RE:  [name], [name]  MRN:  [id number]  /  DOB:  09-16-1940   Dear [name]:   [name] was see in the office today at his request.  In recent months,  he has been experiencing fairly frequent episodes of palpitations  associated with increased blood pressure and headache.  Typically, his  heart rate has not been terribly high as determined by his blood  pressure monitor.  Systolic blood pressures have neared 200 with  diastolics around 100/120.  He took an extra metoprolol on one occasion  without benefit.  He typically rests and things improve over the course  of an hour or so.   He recently had lisinopril 20 mg daily substituted for Ramipril 10 mg  b.i.d.  He does not feel that this is controlling blood pressure as  well.  His other medications are unchanged.   PHYSICAL EXAMINATION:  GENERAL:  Pleasant gentleman who is mildly  agitated but in no real distress.  VITAL SIGNS:  The blood pressure is initially 200/100; subsequently  160/90, heart rate 75 and irregular, respirations 14.  NECK:  No jugular venous distention; normal carotid upstrokes without  bruits.  LUNGS:  Clear  CARDIAC:  Normal first and second heart sounds;  No fourth heart sound  present.  ABDOMEN:  Soft and nontender; no masses.  EXTREMITIES:  No edema.   STUDIES:  Rhythm strip:  Sinus rhythm with very frequent PACs.   IMPRESSION:  [name] is experiencing benign arrhythmia associated with  hypertension and headache.  This raises the question, albeit a very  unlikely one, a pheochromocytoma; a 24-hour urine collection will be  obtained.  We will try to substitute Sectral for metoprolol to determine  if this has a more salutary effect on his arrhythmia.  I suggested he  increase Lisinopril to 40 mg daily.  We will add chlorthalidone 12.5 mg  for better blood pressure control.  Chemistry profile will be checked in  one  month.  He will follow blood pressures at home and return to see me  in two months.    Sincerely,      Gerrit Friends. Dietrich Pates, MD, [facility]  Electronically Signed    RMR/MedQ  DD: 12/06/2006  DT: 12/07/2006  Job #: [id number]

## 2010-09-30 NOTE — H&P (Signed)
NAMECHANDLER, SWIDERSKI                ACCOUNT NO.:  [id number]   MEDICAL RECORD NO.:  [id number]          PATIENT TYPE:  INP   LOCATION:  A219                          FACILITY:  [facility]   PHYSICIAN:  [name], MD    DATE OF BIRTH:  12-31-1940   DATE OF ADMISSION:  04/05/2007  DATE OF DISCHARGE:  LH                              HISTORY & PHYSICAL   CHIEF COMPLAINT:  Chest pain, shortness of breath.   HISTORY OF PRESENT ILLNESS:  This is a 70 year old white male who  presents with a complaint of chest pain.  He states he was sleeping and  all of a sudden the pain just hit him in the middle of the night on the  right side.  There was no radiation.  There was no central pressure with  it.  It felt very pleuritic, hurt with a deep breath.  It was very sharp  and became significantly worse.  He found it worse to lie down and he  started finding himself feeling short of breath.  He states a slight  cough, but dry.  He denied any vomiting, diarrhea, abdominal discomfort,  sweats or chills.  He states he was not coughing up any blood and denies  any angina symptoms over the past several weeks with activity.  He also  relates that he had injured his right leg about a week ago and he also  takes some trips to the beach, but he does state he gets out and walks  around.   PAST MEDICAL HISTORY:  1. The patient has a history of intermittent atrial fibrillation.  2. He also has a history of hyperglycemia.  3. Hyperlipidemia.  4. Hypertension.  5. Chronic back pain with numerous back surgeries.   FAMILY HISTORY:  Hypertension and heart disease, but not of blood clots.   ALLERGIES:  SULFA.   SOCIAL HISTORY:  He does not smoke; he quit over 30 years ago.  He is  married.  He does have children.  He is retired.   CURRENT MEDICATION:  1. Lisinopril 10 mg three daily.  2. Acebutolol 200 mg one b.i.d.  3. Hydrochlorothiazide 25 mg one-half q.a.m.  4. Simvastatin 40 mg one daily.  5. Aspirin  81 mg daily.  6. Multivitamin once daily.   PHYSICAL EXAM:  HEENT: TMs NL, teeth NL.  NECK:  No masses.  CHEST:  CTA.  No crackles.  No rub.  HEART:  Regular.  No murmurs.  ABDOMEN:  Soft.  No guarding or rebound tenderness.  EXTREMITIES:  No edema.  MUSCULOSKELETAL:  Chest wall nontender.  He does have bruising of the  right thigh.   LABORATORY DATA:  Laboratory shows a normal white count and a normal  BMET with the exception of slight elevation of BUN to 28 and creatinine  of 1.47 and a D-dimer which was elevated at 2.24 and also a baseline PT,  which is 1, PTT, which is 34, CBC, which was 13.3 hemoglobin and  hematocrit 39.9.   IMAGING STUDY:  CT scan showed acute pulmonary emboli involving  interlobar  pulmonary artery on the right extending and several right  lower lobe pulmonary artery branches.   ASSESSMENT AND PLAN:  Acute pulmonary embolus -- needs to be on Lovenox.  Because of his age and severity of symptoms, needs to be admitted, also  on Coumadin as well and need to be monitored closely and go forward from  there.      [name], MD  Electronically Signed     SAL/MEDQ  D:  04/08/2007  T:  04/08/2007  Job:  [id number]

## 2010-09-30 NOTE — Letter (Signed)
February 09, 2007    [name], MD  [address]  [place], [place] [address]   RE:  ALEKS, NAWROT  MRN:  [id number]  /  DOB:  1940-06-27   Dear Lorin Picket:   Mr. Greulich returns to the office for continued assessment and treatment  of hypertension and paroxysmal atrial fibrillation. Since the last  visit, he has improved. He reports rare episodes of palpitations, but  some that last up to two hours. He has had no light headedness and not  much in the way of headaches. He took chlorthalidone for one month, but  stopped it when his prescription ran out. He is temporarily off of  Lipitor at the request of the dermatologist during a course of Cipro.   His other medications include:  1. Lisinopril 40 mg daily.  2. Septra 400 mg b.i.d.  3. Aspirin 81 mg daily.  4. Nexium 40 mg daily p.r.n.   Testing for pheochromocytoma was negative.   PHYSICAL EXAMINATION:  GENERAL:  Pleasant gentleman in no acute  distress.  VITAL SIGNS:  Weight 188, 4 pounds more than in May of this year. Blood  pressure 140/85, heart rate 58 and regular, respirations 16.  NECK:  No jugular distension; no carotid bruits.  LUNGS:  Clear.  CARDIAC:  Normal first and second heart sounds; fourth heart sound  present.  ABDOMEN:  Soft and nontender.  EXTREMITIES:  Trace edema.   Rhythm strip: Sinus bradycardia; IVCD.   IMPRESSION:  Mr. Burruss is doing well with his current therapy. He would  benefit from additional blood pressure lowering. Chlorthalidone will be  restarted with a chemistry profile to be obtained in one month. I will  see this nice gentleman again in six months.    Sincerely,      Gerrit Friends. Dietrich Pates, MD, [facility]  Electronically Signed    RMR/MedQ  DD: 02/09/2007  DT: 02/10/2007  Job #: [id number]

## 2010-09-30 NOTE — Procedures (Signed)
NAMEMCLEAN, MOYA                ACCOUNT NO.:  [id number]   MEDICAL RECORD NO.:  [id number]          PATIENT TYPE:  INP   LOCATION:  A219                          FACILITY:  [facility]   PHYSICIAN:  Gerrit Friends. Dietrich Pates, MD, FACCDATE OF BIRTH:  18-Dec-1940   DATE OF PROCEDURE:  04/06/2007  DATE OF DISCHARGE:                                ECHOCARDIOGRAM   REFERRING PHYSICIAN:  [name], M.D. and Gerrit Friends. Dietrich Pates,  M.D., [facility]   CLINICAL DATA:  A 70 year old gentleman with pulmonary embolism.   M-mode:  Aorta 3.3, left atrium 4.6, septum 1.2, posterior wall 1.4, LV  diastole 4.4, LV systole 3.2.   1. Technically adequate echocardiographic study.  2. Left atrial size at the upper limit of normal; normal right atrium      and right ventricle.  3. Mild-to-moderate sclerosis of the aortic valve with mild impairment      in leaflet separation; no stenosis nor insufficiency present.  4. The proximal ascending aorta is normal in diameter; moderate      calcification of the wall and annulus is present.  5. Normal mitral valve with mild annular calcification; normal      tricuspid valve.  6. Normal pulmonic valve and proximal pulmonary artery.  7. Normal left ventricular size; mild concentric hypertrophy; normal      regional and global LV systolic function; estimated ejection      fraction 0.65.      Gerrit Friends. Dietrich Pates, MD, [facility]  Electronically Signed     RMR/MEDQ  D:  04/07/2007  T:  04/07/2007  Job:  [id number]
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
## 2010-10-03 NOTE — Discharge Summary (Signed)
[name], [name]                ACCOUNT NO.:  [id number]   MEDICAL RECORD NO.:  [id number]          PATIENT TYPE:  INP   LOCATION:  A219                          FACILITY:  [facility]   PHYSICIAN:  [name], MD    DATE OF BIRTH:  Oct 31, 1940   DATE OF ADMISSION:  04/05/2007  DATE OF DISCHARGE:  11/21/2008LH                               DISCHARGE SUMMARY   DISCHARGE DIAGNOSES:  1. Pulmonary embolism.  2. Hypertension.  3. Anxiety.  4. Hyperlipidemia.  5. Chronic back pain.   HOSPITAL COURSE:  This patient was admitted in with onset of shortness  of breath, also started having pain in the middle and right side of his  chest, became progressive, hurt when he took a deep breath.  He  presented to the emergency department.  They did the appropriate workup  and a CT scan showed acute pulmonary emboli.  He was started on Lovenox  as well as Coumadin.  It should be noted while in the hospital he did  have atrial fibrillation with rapid ventricular response.  He was  treated with metoprolol 25 mg b.i.d. and Diltiazem orally.  The  patient's heart rate did well.  He did not have any arrhythmia problems.  He was actually stable for discharged on 21st on home Lovenox.   DISCHARGE MEDICATIONS:  He was discharged to home in good condition with  home Lovenox, Coumadin.  He had been given Coumadin orders as well and  he was also instructed on what to watch out for.  He is going to do the  Coumadin 5 mg, 0.5 tablets daily, Lovenox twice daily.  No aspirin,  Motrin or ibuprofen.  He will continue the metoprolol and then also will  be using Diltiazem if continues to have atrial fibrillation problems but  otherwise he is to followup in the office within about a week's time.  He was counseled on warns signs or bleeding and he seems to understand  these all very well.      [name], MD  Electronically Signed     SAL/MEDQ  D:  04/28/2007  T:  04/28/2007  Job:  [id number]

## 2010-10-03 NOTE — Discharge Summary (Signed)
[name], [name]                ACCOUNT NO.:  [id number]   MEDICAL RECORD NO.:  [id number]          PATIENT TYPE:  INP   LOCATION:  3018                         FACILITY:  [facility]   PHYSICIAN:  Stefani Dama, M.D.  DATE OF BIRTH:  04-09-1941   DATE OF ADMISSION:  09/01/2005  DATE OF DISCHARGE:  09/03/2005                                 DISCHARGE SUMMARY   ADMITTING DIAGNOSIS:  Cervical spondylosis with cervical radiculopathy C3-4  and C7-T1 status post arthrodesis C4-C7.   DISCHARGE AND FINAL DIAGNOSES:  Cervical spondylosis with cervical  radiculopathy C3-4 and C7-T1 status post arthrodesis C4-C7.   MAJOR OPERATION:  Posterior decompression C3-4 with segmental fixation C2-C5  and posterior arthrodesis C3-4 and C7-T1, decompression of C8 nerve root via  laminotomy and foraminotomy on the right.   CONDITION ON DISCHARGE:  Improving.   HOSPITAL COURSE:  [name] is a 70 year old individual who had  significant neck pain, shoulder pain, and weakness in the right arm. He has  evidence of spondylitic disease at C7-T1.  He underwent previous anterior  decompression arthrodesis in August of 2006.  This was at C6-7 and C7-T1  levels.  Patient has previously had surgical arthrodesis at C4-5 at C5-6 and  C6-7.  He had a pseudoarthrosis at the C6-C7 level.  The patient had severe  complaints of neck pain and was found to have severe spondylitic  degeneration at C3-4 and failed efforts at conservative management and  because the pain was severe and unrelenting he had been advised regarding  posterior decompression arthrodesis at C3-4 and decompression at C7-T1.  This was undertaken on September 01, 2005.  Postoperatively the patient  tolerated procedure well.  He had some moderate problems with controlled  neck pain initially.  This was brought under easy control with the use of  some oral analgesics and at the time of discharge is discharged home with  Percocet #60 without refills for  pain, Valium 5 mg #40 q.6-8h. as needed for  muscle spasms.  His incision is clean and dry.  He is ambulatory.  He is  voiding without difficulty and at the current time the patient will be  followed up in approximately three weeks' time.   CONDITION ON DISCHARGE:  Improved.      Stefani Dama, M.D.  Electronically Signed     HJE/MEDQ  D:  09/03/2005  T:  09/04/2005  Job:  [id number]

## 2010-10-03 NOTE — H&P (Signed)
NAMETARRIN, LEBOW                ACCOUNT NO.:  [id number]   MEDICAL RECORD NO.:  [id number]          PATIENT TYPE:  INP   LOCATION:  A323                          FACILITY:  [facility]   PHYSICIAN:  Donna Bernard, M.D.DATE OF BIRTH:  [birth year]   DATE OF ADMISSION:  10/18/2004  DATE OF DISCHARGE:  LH                                HISTORY & PHYSICAL   CHIEF COMPLAINT:  Abdominal pain, diarrhea, nausea, nearly passing out.   SUBJECTIVE:  This patient is a 70 year old white male with a history of  chronic back pain, hypertension, hyperlipidemia, and glucose intolerance,  and reflux who presents to the emergency room the day of admission with  acute complaints.  [place] lots of freshly caught clams and crabs prepared in various ways.  He  had some leftover crabs which he ate approximately three days after cooking  them.  This occurred one day prior to his symptoms.  The day of admission,  the patient began to develop difficulties with severe diarrhea.  This was  accompanied by a cramping sensation in the abdomen.  The patient felt light-  headed with profuse diarrhea.  He also had significant nausea.  He states  that he felt like he needed to throw up, but he could not.  The patient was  out in the shop walking back up to his house and experienced extreme  lightheadedness where he had to basically go down to the ground before  passing out.  He never completely passed out.  The patient had no  significant chest pain or shortness of breath.   FAMILY HISTORY:  Noncontributory.   PAST SURGICAL HISTORY:  See chart.   PAST MEDICAL HISTORY:  Chronic problems as noted above.   ALLERGIES:  None listed.   SOCIAL HISTORY:  The patient is married, works at [employer].  He does admit to  alcohol use a couple of beers a day.  He does not smoke.   REVIEW OF SYSTEMS:  Otherwise negative.   PHYSICAL EXAMINATION:  VITAL SIGNS:  Temperature 101.9,  pulse 107,  respiratory rate 20, blood pressure 105/60.  GENERAL:  The patient is alert, middle-aged.  HEENT:  Normal.  NECK:  Supple.  LUNGS:  Clear.  HEART:  Regular rate and rhythm.  ABDOMEN:  Bowel sounds present, mild tenderness diffusely.  EXTREMITIES:  Normal.  NEUROLOGIC:  Intact.   LABORATORY DATA:  CBC:  WBC 12,000, with 86% neutrophils.  Hemoglobin 15.3.  Urinalysis normal.  Met-7:  Glucose up slightly at 115, potassium 4.1.   IMPRESSION:  1.  Gastroenteritis with complicating factors including recent shellfish      ingestion and medications for hypertension.  I think that the patient's      Altace increase is likely to have his symptoms of near syncope secondary      to probably volume contraction along with blood pressure medications on      board.  With the recent shellfish ingestion, may have to be concerned      about bacteria  such as ____________, and we will certainly cover this.      He could just possibly have a stomach virus, but the severity of his      diarrhea and his other symptoms along with the recent shellfish      ingestion raises the probability of food poisoning.  2.  The patient had significant heel pain for the past month and would like      a x-ray.  His right heel is indeed tender, and we will press on doing a      x-ray per his request.  3.  Hyperlipidemia.  4.  Hypertension.  5.  Reflux.  6.  Chronic back pain.   PLAN:  Per orders.       WSL/MEDQ  D:  10/19/2004  T:  10/19/2004  Job:  [id number]

## 2010-10-03 NOTE — Letter (Signed)
Sep 17, 2006    [name], MD  [address], [address]  [place], [place] [address]   RE:  [name], [name]  MRN:  [id number]  /  DOB:  [birth year]   Dear [name]:   [name] is seen in the office today at his request due to recurrent  episodes of tachy palpitations.  Early in April, he noted the onset of  rapid heartbeat.  He experiences a sense of anxiety with this.  There is  no dyspnea, light-headedness, syncope, nor chest discomfort.  Symptoms  resolved after approximately 1 hour.  He had a recurrent episode  yesterday.  Blood pressure was markedly elevated, but his blood pressure  cuff was not following his heart rhythm very well.  Heart rates as low  as 37 were recorded, and blood pressures as high as 200/100.  Again,  symptoms resolved spontaneously after approximately 1 hour.   [name] drinks a fair amount of caffeinated coffee and other caffeine-  containing beverages.  He has experienced substantial anxiety of late.  He relates this to a beach house that his wife does not enjoy spending  time at and that he would like to sell, but the market does not permit  that.  It sounds as if he may be having some marital issues as well.  He  related these symptoms to Dr. Danielle Dess at his last visit, and was given a  prescription for Xanax, but he has rarely used this.  When he does, he  feels better.   Except for episodes of palpitations, blood pressure control has been  good, as assessed by the patient.  Systolics are typically less than 130  and diastolics less than 80.   CURRENT MEDICATIONS:  1. Ramipril 10 mg b.i.d.  2. Metoprolol 50 mg b.i.d.  3. Nexium 40 mg daily p.r.n.  4. Atorvastatin 40 mg daily.  5. Aspirin 81 mg daily.   EXAM:  Pleasant trim gentleman in no acute distress.  The weight is 184, 10 pounds more than in January.  Blood pressure  158/90, heart rate 65 and regular, respirations 16.  NECK:  No jugular venous distension; no carotid bruits.  LUNGS:  Clear.  CARDIAC:  Normal first and second heart sounds; fourth heart sound  present.  EXTREMITIES:  No edema.   EKG:  Normal sinus rhythm; Nondiagnostic inferior Q waves, and delayed R  wave progression, both of which are new since a prior tracing of  May 07, 2004.   IMPRESSION:  [name] is doing fairly well from a cardiac standpoint.  I am not inclined to start antiarrhythmic therapy for fairly infrequent  episodes that are not terribly aversive for him.  I have suggested  instead that he take an extra metoprolol with the onset of symptoms and  call the office that we can document his rhythm.   He does appear to have significant anxiety and would likely benefit from  some counseling.  I have suggested that he discuss this with you at his  next visit.  He will continue to monitor blood pressure.  He will  attempt to switch to drinks containing no or minimal caffeine.  I will  leave his followup as previously scheduled for early next year, but he  will call if he has additional problems in the interim.    Sincerely,      Gerrit Friends. Dietrich Pates, MD, [facility]  Electronically Signed    RMR/MedQ  DD: 09/17/2006  DT: 09/17/2006  Job #: [id number]

## 2010-10-03 NOTE — Procedures (Signed)
[name], [name]                ACCOUNT NO.:  [id number]   MEDICAL RECORD NO.:  [id number]          PATIENT TYPE:  OUT   LOCATION:  RAD                           FACILITY:  [facility]   PHYSICIAN:  [place] Bing, M.D.  DATE OF BIRTH:  [birth year]   DATE OF PROCEDURE:  05/08/2004  DATE OF DISCHARGE:                                  ECHOCARDIOGRAM   REFERRING PHYSICIAN:  Drs. Luking and Mali.   CLINICAL DATA:  A 70 year old gentleman with atrial fibrillation,   M-MODE TRACINGS:  1.  Aorta 2.6.  2.  Left atrium 4.1.  3.  Septum 1.6.  4.  Posterior wall 1.1.  5.  LV diastole 4.2.  6.  LV systole 2.6.   IMPRESSION:  1.  Technically adequate echocardiographic study.  2.  Left atrial size at the upper limit of normal; slight right atrial      enlargement.  Normal right ventricular size and function.  3.  Normal mitral valve; mild annular calcifications; trivial regurgitation.  4.  Very mild aortic sclerosis in a trileaflet valve that functions      normally.  5.  Normal tricuspid valve with physiologic regurgitation and normal      estimated RV systolic pressure; normal pulmonic valve.  6.  Normal left ventricle size; mild hypertrophy with prominent proximal      septal thickening.  Normal regional and global function.  7.  IVC not well seen -- probably not dilated.     [place]   RR/MEDQ  D:  05/09/2004  T:  05/09/2004  Job:  [id number]

## 2010-10-03 NOTE — Procedures (Signed)
NAMEMICHOEL, KUNIN                ACCOUNT NO.:  [id number]   MEDICAL RECORD NO.:  [id number]          PATIENT TYPE:  EMS   LOCATION:  ED                            FACILITY:  [facility]   PHYSICIAN:  Edward L. Juanetta Gosling, M.D.DATE OF BIRTH:  1940/11/18   DATE OF PROCEDURE:  04/02/2004  DATE OF DISCHARGE:  04/28/2004                                EKG INTERPRETATION   FINDINGS:  The rhythm is sinus rhythm with a rate in the 90's.  There are ST-  T wave abnormalities, most markedly T wave inversion inferiorly which is  nonspecific.   IMPRESSION:  Abnormal electrocardiogram.     [place]   ELH/MEDQ  D:  05/02/2004  T:  05/02/2004  Job:  [id number]

## 2010-10-03 NOTE — Discharge Summary (Signed)
[name], [name]                ACCOUNT NO.:  [id number]   MEDICAL RECORD NO.:  [id number]          PATIENT TYPE:  INP   LOCATION:  3103                         FACILITY:  [facility]   PHYSICIAN:  Stefani Dama, M.D.  DATE OF BIRTH:  Jul 02, 1940   DATE OF ADMISSION:  05/25/2006  DATE OF DISCHARGE:  05/31/2006                               DISCHARGE SUMMARY   ADMITTING DIAGNOSIS:  1. Lumbar spondylosis and stenosis L2-3, L3-4, and L4-5.  2. Previous diskectomy L4-5, L3-4.  3. Scoliosis, spondylolisthesis L3-4 with lumbar radiculopathy.   DISCHARGE AND FINAL DIAGNOSES:  1. Lumbar spondylosis and stenosis L2-3, L3-4, and L4-5.  2. Previous diskectomy L4-5, L3-4.  3. Scoliosis, spondylolisthesis L3-4 with lumbar radiculopathy.  4. Hypertension.  5. Hyperlipidemia.  6. Recurrent paroxysmal atrial fibrillation  7. Postoperative renal insufficiency.  8. Acute blood loss anemia, postoperatively.   CONDITION ON DISCHARGE:  Improving.   HOSPITAL COURSE:  [name] is a 70 year old individual who has had a  history of significant spondylitic disease in the cervical spine, having  undergone a fusion from C2-T1.  He also has a significant history of  lumbar spondylosis with stenosis, spondylolisthesis at the L3-4 level,  bilateral radiculopathies.  He has been putting of significant surgery  for some time.  He has been having weakness and his pain has become  intolerable.  He was taken to the operating room on the 8th, where he  underwent decompression arthrodesis from L1-L5, with arthrodesis  posteriorly.  The procedure was tolerated well.  The patient had acute  blood loss anemia initially postoperatively and this was treated with a  transfusion of 2 units packed red cells.  He tolerated this quite well.  However, during his second and third postoperative days, he developed  bouts of paroxysmal atrial fibrillation.  Initially, this responded to  some beta blockers, however, on the  fourth hospital day it recurred  substantially and it required an IV Cardizem drip.  He was seen by Dr.  Charlies Constable in cardiac consultation, and he was put on Cardizem 30 mg  q.6 h in addition to metoprolol.  This seemed to control his heart rate  nicely.  He was seen by the critical care service also as he had some  evidence of acute renal insufficiency during the postoperative phase,  which resolved spontaneously with some fluids.  At the current time, he  is doing quite well.   His medications have been adjusted, and he is currently only requiring  some Percocet for pain control.   He is discharged home at this time.   He will have followup with the cardiologist.   1. He is on Diltiazem 240 mg daily.  2. His Altace will be left off.  3. Digoxin has been discontinued.   He will be seen in followup by the cardiologist in two weeks' time.  He  was seen by Dr. Dietrich Pates here in office.   CONDITION ON DISCHARGE:  Improved.      Stefani Dama, M.D.  Electronically Signed     HJE/MEDQ  D:  05/31/2006  T:  05/31/2006  Job:  [id number]

## 2010-10-03 NOTE — H&P (Signed)
[place]  Patient:    [name], [name]                  MRN: [id number] Adm. Date:  09/25/99 Attending:  Stefani Dama, M.D.                         History and Physical  ADMITTING DIAGNOSIS: Cervical spondylosis with both left and right-sided radiculopathy at C4-5, C5-6, and C6-7.  HISTORY OF PRESENT ILLNESS: [name] is a 70 year old individual who has been seen and treated by me for what initially was noted to be an osteomyelitis of the C2 vertebra in 1990.  He resolved that and was able to return to normal work and function.  He has had some intermittent problems with some occasional low back pain but on March 02, 1999 the patient reported a work-related injury where he sustained pain at the base of his neck and in his left shoulder, with numbness and tingling in the right hand and weakness in the right arm, and pain in the right shoulder.  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  He felt sudden pain and a stinging sensation in the back of his neck, which he describes as like a hornet stink.  Subsequently he noted right arm pain and he started to have tingling and pain in the right arm and shoulder.  He was seen by the factory physician and ultimately an MRI of the cervical spine was performed on June 04, 1999.  He had had ongoing symptoms of numbness and tingling in the right upper extremity, particularly worse when he was using any tool or machine that caused vibration in the right upper arm.  He continued to have pain in the back of the neck and base of the neck, worse with any type of physical activity.  He described pain in the region of the right shoulder.  He had not had any work-up for the right shoulder discretely. He was seen by me in March for difficulties with the process and I reviewed his  films.  He is now being admitted to undergo surgical decompression at three levels in the cervical spine, C4-5, C5-6, and C6-7.  PAST MEDICAL HISTORY: His general health has been good.  He did have a history of osteomyelitis that involved both the neck and region of the ankle.  This was treated conservatively in 1990 and he gradually resolved and improved.  He has been seen intermittently for complaints of chronic back pain.  SOCIAL HISTORY: He is married.  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  ALLERGIES: No known drug allergies.  CURRENT MEDICATIONS: At the current time the patient is not taking any medications regularly.  REVIEW OF SYSTEMS: Negative except for the items noted in the History of Present Illness and Past Medical History, with history of osteomyelitis.  PHYSICAL EXAMINATION:  GENERAL: He is an alert and oriented, cooperative individual in no overt distress.  NEUROLOGIC: Range of motion of his neck reveals that he turns to the right about 70 degrees and turns to the left 60 degrees.  He extends and flexs normally.  Axial compression reproduces no pain at this time.  Motor strength in  the upper extremities reveals the deltoids are 5/5 bilaterally, biceps on the right side weak, 4/5.  Triceps strength is 5/5 bilaterally.  Grip strength is decreased on the right side, 4/5 compared to the left.  Tone and bulk are normal in all muscle groups tested.  Deep tendon reflexes are absent in the right biceps, 1+ in the left biceps, 1+ in both triceps, 2+ in the patella, 1+ in the Achilles.  Babinski reflexes are downgoing.  Sensation is diminished to vibration and pinprick in the right upper extremity, particularly in the C7 and C6 distribution; that is on the radial aspect of the hand.  No masses are palpable in the neck.  There is some modest tenderness to palpation over the shoulder area on the  right, particularly in the supraspinatus muscle region. There is also some tenderness in the bicipital muscle insertion area on the right side anteriorly.  There is tenderness to palpation over the posterior aspect of the neck at the base of the neck between C7 and T1.  Cranial nerve examination reveals the pupils are 3 mm and briskly reactive to light and accommodation.  Extraocular movements are full.  The face is symmetric to grimace.  Tongue and uvula are in the midline.  HEENT: Sclerae and conjunctivae clear.  NECK: Carotid has normal upstrokes and no bruits are heard.  HEART: Regular rate and rhythm, no murmurs heard.  LUNGS: Clear to auscultation.  ABDOMEN: Soft, bowel sounds positive.  No masses palpable.  EXTREMITIES: No clubbing, cyanosis, or edema.  IMPRESSION: The patient has spondylitic degeneration at C4-5, C5-6, and C6-7. There is a disk herniation at C5-6 eccentric to the right side causing marked foraminal stenosis and there is foraminal stenosis bilaterally at C6-7 with left-sided foraminal stenosis at C4-5.  Main symptoms at the current time are right C6 and C7; however, in advising surgical intervention I advised that the patient undergo three level anterior diskectomy and arthrodesis, and he is now being admitted to undergo this procedure. DD:  09/25/99 TD:  09/25/99 Job: [id number]

## 2010-10-03 NOTE — H&P (Signed)
[name], [name]                ACCOUNT NO.:  [id number]   MEDICAL RECORD NO.:  [id number]          PATIENT TYPE:  INP   LOCATION:  3018                         FACILITY:  [facility]   PHYSICIAN:  Stefani Dama, M.D.  DATE OF BIRTH:  [birth year]   DATE OF ADMISSION:  09/01/2005  DATE OF DISCHARGE:  09/03/2005                                HISTORY & PHYSICAL   ADMISSION DIAGNOSES:  Cervical spondylosis C3-4 with radiculopathy,  myelopathy, cervical spondylosis with radiculopathy C8 on the right  secondary to spondylosis C7-T1, ?Pseudoarthrosis C7-T1.   HISTORY OF PRESENT ILLNESS:  [name] is a 70 year old individual whom I  have known since early 81.  At that time, he suffered with a spontaneous  osteomyelitis of C2-3.  He underwent spontaneous arthrodesis of the C2-3  vertebra.  In the mid 90's he developed spondylitic degeneration of C4-5, C5-  6, and C6-7.  He underwent an anterior decompression arthrodesis.  He  tolerated this procedure well, but he subsequently had pseudoarthrosis of C6-  7.  In August of 2006, he underwent revision of the pseudoarthrosis with  anterior decompression at the C6-7 level and also at C7-T1 where he was  developing increasing spondylitic changes.  He was noted to have some  spondylosis at the C3-4 level, however, it was felt that this was likely  asymptomatic.  After his decompression, the patient gradually developed  weakness in the right upper extremity.  This was characterized in the C8  distribution with numbness and tingling down the ulnar aspect of his right  hand.  He did not respond well to conservative efforts.  Gradually his  strength seemed to improve.  This was in January and February, but he still  complained bitterly to pain in the right hand, pain in the shoulder on both  sides, worse on the left side than on the right.  A repeat MRI was performed  earlier this year and this demonstrated advancement of the spondylitic  process at  the C3-4 level, spondylitic changes in the right foramen at the  C7-T1 with some foraminal narrowing at that level.  After careful  consideration, he was advised regarding surgical decompression via posterior  route at C3-4 and at the C7-T1 level.  He is now being admitted to the  hospital for this procedure.   PAST MEDICAL HISTORY:  He has generally had fairly good health.  He has had  severe progressive spondylitic disease as noted.   PAST SURGICAL HISTORY:  Cervical spine surgeries noted.  He has also had  diskectomy in the lower lumbar spine.   CURRENT MEDICATIONS:  1.  Ibuprofen 800 mg t.i.d.  2.  Aspirin a day.  3.  Altace 10 mg b.i.d.  4.  Lipitor 20 mg daily.  5.  Nexium p.r.n.  6.  Doxazosin.  7.  Metoprolol 50 mg b.i.d.   SOCIAL HISTORY:  He is recently retired.  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   REVIEW OF SYSTEMS:  Notable for some previous  maxillofacial injury secondary  to being kicked by a cow. His other health has been good.  He reports no  other surgical interventions.   PHYSICAL EXAMINATION:  GENERAL:  He is an alert, oriented individual.  He  has weakness in the right C8 distribution characterized by decreased grip,  decreased intrinsic function.  There is no evidence of any overt atrophy.  His motor strength is weak also in the biceps and grip on the right side.  The triceps strength is also modestly weak to 4/5 on the right side compared  to the left.  Sensation appears diminished in that right C8 distribution.  His other strength in the left upper extremity is good with tone and bulk  being good in the upper extremity.  NEUROLOGY:  He has cranial nerve function and notes that his pupils are 4 mm  and briskly reactive to light and accommodation.  The extraocular movements  are full.  Face is symmetric to grimace.  Tongue and uvula are in the  midline.  Sclerae and conjunctivae are clear.   Facial sensation is intact  bilaterally.  No masses are noted in the neck.  He has a well-healed, left-  sided anterior neck scar.  LUNGS:  Clear to auscultation.  HEART:  Regular rate and rhythm with no murmurs heard.  ABDOMEN:  Soft and protuberant.  Bowel sounds are positive.  No masses are  palpable.  BACK:  Moderate tenderness in the mid lumbar spine to palpation.  There is a  well-healed midline scar.  His motor strength in the lower extremities  reveals the iliopsoas, quads, tibialis anterior, and gastrocnemius have 3/5  strength.  His sensation is intact in the distal lower extremities.   IMPRESSION:  The patient has evidence of spondylitic disease in the lower  lumbar spine.  He also has severe spondylosis in the cervical spine, worse  at the C3-4 and the C7-T1 levels.  He is now to be admitted to undergo  surgical decompression and arthrodesis.  This will be done via posterior  route.      Stefani Dama, M.D.  Electronically Signed     HJE/MEDQ  D:  09/03/2005  T:  09/03/2005  Job:  [id number]

## 2010-10-03 NOTE — Discharge Summary (Signed)
[place]. [facility]  Patient:    [name], [name]                       MRN: [id number] Adm. Date:  [id number] Disc. Date: [id number] Attending:  Jonne Ply                           Discharge Summary  ADMITTING DIAGNOSIS:  Cervical spondylosis with left cervical radiculopathy and herniated nucleus pulposus at C5-6, spondylosis C4-5 and C6-7.  DISCHARGE DIAGNOSIS:  Cervical spondylosis with left cervical radiculopathy and herniated nucleus pulposus at C5-6, spondylosis C4-5 and C6-7.  OPERATION:  Anterior cervical diskectomy and arthrodesis C4-5, C5-6, and C6-7.  CONDITION ON DISCHARGE:  Improving.  HOSPITAL COURSE:  The patient is a 70 year old individual who has had significant neck, shoulder, and arm pain, with a herniated nucleus pulposus and spondylitic disease at multiple levels.  He was advised regarding surgery, and underwent anterior diskectomy and arthrodesis at C4-5, C5-6, and C6-7. His incision has been clean and dry.  He is ambulatory.  He is having less numbness and pain in his upper extremities, and he is pleased with his result. At this time we will discharge him home with a prescription for Vicodin #40 without refills, Xanax 0.5 mg #20 without refills.  He has been advised that he will be seen in the office in three weeks time.  He has been advised also as to his postoperative activities. DD:  09/26/99 TD:  09/27/99 Job: [id number]

## 2010-10-03 NOTE — Op Note (Signed)
NAMEJERRET, [name]                ACCOUNT NO.:  [id number]   MEDICAL RECORD NO.:  [id number]          PATIENT TYPE:  INP   LOCATION:  3018                         FACILITY:  [facility]   PHYSICIAN:  Stefani Dama, M.D.  DATE OF BIRTH:  [birth year]   DATE OF PROCEDURE:  09/01/2005  DATE OF DISCHARGE:  04/23/2005                                 OPERATIVE REPORT   PREOPERATIVE DIAGNOSIS:  Cervical spondylosis with instability C3-C4,  cervical radiculopathy, right C8.   POSTOPERATIVE DIAGNOSIS:  Cervical spondylosis with instability C3-C4,  cervical radiculopathy, right C8.   PROCEDURE:  1.  Posterior decompression C3-C4 via bilateral laminotomies, foraminotomies      and decompression of right C8 nerve root via laminotomy and foraminotomy      with the use of operating microscope, microdissection technique.  2.  Segmental fixation C2, C3, C4, C5 with [employer] instrumentation.  3.  Posterolateral arthrodesis C3-C4 and C7-T1 with allograft and autograft.   SURGEON:  Dr. Danielle Dess.   FIRST ASSISTANT:  Dr. Maeola Harman   ANESTHESIA:  General endotracheal.   INDICATIONS:  [name] is a 70 year old individual who has had extensive  problems with his neck since 1990.  At that time he had osteomyelitis at C2-  C3 and was treated conservatively and he spontaneously fused that joint.  He  did well until the mid 90s when he started having problems with cervical  radiculopathy in the C5 and C6 distribution, underwent anterior  decompression of C4-5, C5-6 and C6-7.  He developed a pseudoarthrosis at C6  and C7 and then had increasing spondylitic disease at C7-T1 having some mild  symptoms of right cervical radiculopathy.  He was advised regarding surgical  decompression arthrodesis with revision of his anterior surgery.  This was  performed back in August 2006.  Postoperatively, the patient had continued  problems with severe, unrelenting neck pain.  He also had a severe right C8  radicular  syndrome characterized by a weakness in the right arm.  EMG nerve  conduction study showed minimal amounts of changes of any denervation.  His  MRI however demonstrated spondylitic degeneration with some modest foraminal  narrowing at C7-T1.  He was noted to have a severe cystic change causing  foraminal stenosis on the right side at C3-C4.  He also was noted to have  about 3 mm of anterolisthesis.  For that reason it was decided that he  should undergo posterior decompression arthrodesis at the C3-4 level and  decompression on the right side at C7-T1.   PROCEDURE:  The patient was brought to the operating room supine on the  stretcher.  After smooth induction of general endotracheal anesthesia he had  a Foley catheter placed and appropriate monitoring lines were placed and he  was then carefully placed in three-point headrest and turned to the prone  position.  Care was taken to make sure that alignment of cervical spine was  quite anatomic.  This was done with fluoroscopy.  The bony prominences were  then carefully padded and protected.  Care was taken to make sure that his  chin and his airway were in good position and once all was secured, the back  of his neck was then shaved, prepped with DuraPrep and draped in sterile  fashion.  A vertical incision was made in the neck after localizing the C3-  C4 space and dissection was carried down to the cervical dorsal fascia which  was opened on either side of midline and the interlaminar space at C3-C4 was  exposed.  Self-retaining retractors were placed deep into the wound and this  exposed the interlaminar space and the facette processes at the C3-C4 level.  They were noted to be significantly loose and then by taking up the soft  tissues in the anterior laminar space the common dural tube was exposed  first on the left side.  On the left side also there was noted be a severe  degenerative process within the facette joint characterized by  overgrowth of  the synovium and extrusion of the synovium both dorsally and ventrally.  Ventrally it was clearly compressing the C4 nerve root.  A. provisional  foraminotomy was created also on the right side.  The microscope was draped  and brought into the field and then using microdissection technique, the  left C4 nerve root was cleared of a significant synovial cyst that was  encasing the passage of the nerve root itself.  This was first done on the  left side and then a similar procedure was carried out on the right side.  At this point it was noted that arthrodesis with stabilization of C3-C4  would need to be undertaken and this would require placement of fixation  above and below; however, it was felt that due to the degree of motion two  levels of purchase would need to be obtained and that would involve  placement of hardware at the C2 level and the C5 level.  Therefore facette  screws were then placed using a standard Magerl technique at C2, C3, C4 and  C5 bilaterally.  14 mm screws were used at all levels except for C3 where 16  mm screws were placed.  Placement of individual screws was checked carefully  with fluoroscopic guidance.  No cutout of the screws was noted and no  significant bleeding from within the bone was noted when the pilot holes  were drilled to the appropriate depth.  Once the screws were placed, then  rods were fashioned to fit between the screw heads.  Rods were contoured and  fitted appropriately and provisionally tightened.  At this point  decompression of C7-T1 was undertaken by extending the incision inferiorly  exposing the C6 and C7 spaces and ultimately C7-T1 on both sides was  exposed.  There was noted be a very minimal amount of motion between C7 and  T1; however, this did suggest that there was not a solid arthrodesis at this  level from the anterior fusion that had been performed some months ago.  No gross mobility was noted and the laminotomy  and foraminotomy was created on  the right side using the operating microscope and microdissection technique  to decompress the upgoing and exiting C8 nerve root.  Once the decompression  was assured and hemostasis from the epidural bleeding veins was controlled  with bipolar cautery and some small pledgets of Gelfoam. Then the rest of  the interlaminar space in the interfacette joint was decorticated using a  high-speed drill and 2.8-mm dissecting tool.  [place] bone graft was then  placed over the site. OsteoCel  allograft bone matrix material was used over  the C7-T1 and C3-C4 complex along with two small pieces of Vitoss bone  matrix were placed in the interlaminar space on either side.  This was done  after it was assured that both the C4 nerve roots and the C8 nerve root were  well decompressed.  Hemostasis in the soft tissues was obtained nicely and  the grafting sponge was soaked with the patient's own blood from the wound  itself.  Once this was completed then the retractors were removed.  The  cervical dorsal fascia was closed primarily with #1 Vicryl, thus completing  the arthrodesis the internal fixation.  It should be noted that all the  screws were torqued down to the requisite 30 inch-pounds of torque using the  torque wrench.  This was from C2 down to C5.  The wound was then closed with  three layers of suture and surgical staples were used on the skin.  Dry  dressing was applied to the back of neck and the patient was then awoken in  the operating room after being released from the three-point headrest and  placed in the supine position.  The patient tolerated procedure well.  Blood  loss estimated 400 mL.      Stefani Dama, M.D.  Electronically Signed     HJE/MEDQ  D:  09/01/2005  T:  09/02/2005  Job:  [id number]

## 2010-10-03 NOTE — Op Note (Signed)
[place]  Patient:    [name], [name]                       MRN: [id number] Proc. Date: 09/25/99 Adm. Date:  [id number] Disc. Date: [id number] Attending:  Jonne Ply                           Operative Report  PREOPERATIVE DIAGNOSIS:  Cervical spondylosis and herniated nucleus pulposus with right cervical radiculopathy C4-5, C5-6 and C6-7.  POSTOPERATIVE DIAGNOSIS:  Cervical spondylosis and herniated nucleus pulposus with right cervical radiculopathy C4-5, C5-6 and C6-7.  PROCEDURE:  Anterior cervical diskectomy and arthrodesis with structural allograft and Synthes fixation C4-5, C5-6, and C6-7.  SURGEON:  Stefani Dama, M.D.  ASSISTANT:  Tanya Nones. Jeral Fruit, M.D.  ANESTHESIA:  General endotracheal.  INDICATIONS:  The patient is a 70 year old individual, who has had significant neck, shoulder and right arm pain.  He has a herniated nucleus pulposus of the C5-6 level on the left, in addition, to significant spondylitic disease at C6-7 with bilateral foraminal stenosis worse on the right than on the left and also foraminal stenosis on the right side at C4-5.  He has been advised regarding surgery and he was taken to the operating room now to undergo surgical decompression for significant symptoms of pain in the neck and left shoulder, tingling in the right hand and weakness in the right arm at C5-6 and C6-7.  PROCEDURE:  The patient was brought to the operating room, placed on table in supine position.  After smooth induction of general endotracheal anesthesia, he was placed in 5 pounds of Holter traction.  Neck was shaved, prepped with Duraprep and draped in sterile fashion.  A transverse incision was made in the neck and carried down through the platysma.  The plane between the sternocleidomastoid and the strap muscles was dissected bluntly until the prevertebral space was reached.  First identifiable disk space on a  radiograph was identified as C4-5.  Dissection was then carried through the longus coli muscle down to C5-6 and C6-7.  The ventral aspect of the disk space were cleared.  Ventral osteophytes were cleared, particularly off of C5-6 and then a diskectomy was first performed at C4-5.  Here, there was significant right-sided foraminal stenosis secondary to osteophytic overgrowth.  This was drilled down with Midas Rex and A2 bur.  Posterior longitudinal ligament was lifted and opened from side to side and a good decompression was encountered here.  Hemostasis from some epidural veins was easily maintained with some bipolar cautery and pledgets of Gelfoam, which were later removed.  The interspace was then filled with a round, 7 mm fibular graft, which had some of the resected bone packed into the cancellous cavity.  Attention was turned to C5-6, where a similar procedure was carried out here.  At the posterior longitudinal ligament level, there was a small paracentral disk herniation on the left side at C5-6 and the foramen was markedly stenotic secondary to spondylitic overgrowth.  Uncinate process hypertrophy was also encountered and long uncinate spur was drilled off on both sides at C5-6.  Once this area was decompressed in a similar fashion, a round 7 mm graft was placed into the interspace.  Again, packed with bone in the cancellous marrow cavity. Attention was then turned to C6-7, where here, spondylitic stenosis was encountered, particularly in the lateral recesses of the foramen. Uncinate  spurs were again drilled off at this level.  A 7 mm round fibular graft was placed into this interspace after the posterior longitudinal ligament was completely resected from side to side.  Traction was then removed from the neck and the neck was placed in slight flexion.  A 60 mm Synthes plate was then placed over the ventral aspect of the vertebral bodies and secured with eight locking 4 x 16 mm  screws.  Placement of the graft and the construct was checked radiographically and found to be quite adequate.  The area was copiously irrigated with antibiotic irrigating solution.  Hemostasis from the soft tissue was doubly checked and then the platysma was closed with 3-0 Vicryl interrupted fashion and 3-0 Vicryl was used subcuticularly to close the skin.  Clear plastic dressing was placed on the skin.  The patient tolerated the procedure well.  Blood loss was estimated at 150 cc. DD:  09/25/99 TD:  09/27/99 Job: [id number]

## 2010-10-03 NOTE — Op Note (Signed)
. [facility]  Patient:    [name], BOER Visit Number: [id number] MRN: [id number]          Service Type: OUT Location: RAD Attending Physician:  Jonne Ply Dictated by:   Stefani Dama, M.D. Proc. Date: 08/16/01 Admit Date:  08/10/2001 Discharge Date: 08/10/2001                             Operative Report  PREOPERATIVE DIAGNOSIS:  Herniated nucleus pulposus, L2-3 left with left lumbar radiculopathy.  POSTOPERATIVE DIAGNOSIS:  Herniated nucleus pulposus, L2-3 left with left lumbar radiculopathy.  OPERATION PERFORMED:  Lumbar microendoscopic diskectomy, L2-3 left with operating microscope and microdissection technique.  SURGEON:  Stefani Dama, M.D.  ASSISTANT:  Payton Doughty, M.D.  ANESTHESIA:  General endotracheal.  INDICATIONS FOR PROCEDURE:  The patient is a 70 year old individual who has had significant back and left lower extremity pain.  He has had a large herniated disk at the L2-3 level on the left side and he is advised regarding surgical decompression.  DESCRIPTION OF PROCEDURE:  The patient was brought to the operating room and placed on the table in supine position.  After smooth induction of general endotracheal anesthesia he was turned  prone.  The back was shaved, prepped with DuraPrep and draped in a sterile fashion.  Using guidance, the L2-3 interspace was localized and a K-wire was passed to this level on one side using a wanding technique and incision was increased to allow passage of an 18 mm diameter by 5 cm deep endoscopic cannula.  The cannula was then affixed to the operating table with a clamp.  Microscope was draped and brought into the field and then using microdissection technique, laminotomy of L2 and L3 was performed on the left side.  The inferior portion down to L3 was exposed and the area alongside the pedicle of L3 was exposed.  This yielded a significant quantity of disk on that left side  after dissecting in a microdissection fashion, the common dural tube, the epidural veins in this region.  With the dura retracted medially, the disk was resected from this region.  Epidural veins were controlled for hemostasis.  Caution was taken to gently retract the nerve root and make sure that its function would be preserved.  The disk was evacuated from this region in a piecemeal fashion. However, ultimately, the entire disk fragment was removed and the ligament was inspected.  There was noted to be a small rent on the inferior aspect of the ligament.  The disk space itself had a small bulge but this was not open.  In the end, the common dural tube and the take off of the L3 nerve root was noted to be free and clear.  Hemostasis was achieved in this area with bipolar cautery and some microdissection technique.  The area was copiously irrigated with antibiotic irrigating solution and the endoscopic cannula was removed. Fascia was closed with 3-0 Vicryl and the subcuticular tissue was closed with 3-0 Vicryl suture.  The patient tolerated the procedure well and was returned to recovery in stable condition. Dictated by:   Stefani Dama, M.D. Attending Physician:  Jonne Ply DD:  08/16/01 TD:  08/17/01 Job: [id number]

## 2010-10-03 NOTE — Op Note (Signed)
[name]                ACCOUNT NO.:  [id number]   MEDICAL RECORD NO.:  [id number]          PATIENT TYPE:  INP   LOCATION:  3027                         FACILITY:  [facility]   PHYSICIAN:  Stefani Dama, M.D.  DATE OF BIRTH:  07/31/1940   DATE OF PROCEDURE:  DATE OF DISCHARGE:                                 OPERATIVE REPORT   PREOPERATIVE DIAGNOSIS:  Cervical spondylosis pseudoarthrosis C4-5, C5-6,  cervical radiculopathy C6-C7.   POSTOPERATIVE DIAGNOSIS:  Cervical spondylosis pseudoarthrosis C4-5, C5-6,  cervical radiculopathy C6-C7.   PROCEDURES:  1.  Anterior cervical decompression and arthrodesis with structural      allograft plate fixation.  2.  Removal of previous plate from Z6-X0.   SURGEON:  Stefani Dama, M.D.   FIRST ASSISTANT:  Clydene Fake, M.D.   SECOND ASSISTANT:  Susie L. Burns, RNFA.   INDICATIONS:  [name] is a 70 year old individual who has had  significant neck, shoulder, and arm pain with a severe exacerbation of  chronic neck pain in the recent past.  He has had pain now for 3 weeks that  has been quite unrelenting and not responsive to conservative measures.  He  had a previous anterior decompression arthrodesis from C3-C6. He has had  previous decompression arthrodesis of C2-C3 secondary to an osteomyelitis in  early 1990s. He did have graft collapse at the level of C5-6 and there was  concern that he needed surgical decompression arthrodesis or revision of  such, now, because he was having substantial cervical radicular pain; and he  has been advised regarding decompression and revision of his neck surgery.   DESCRIPTION OF PROCEDURE:  The patient brought to the operating room, placed  on table in the supine position.  After the smooth induction of general  endotracheal anesthesia, he was placed in 5 pounds of open traction. The  neck was prepped with DuraPrep and draped in sterile fashion. A transverse  incision was created  through the old scar. Dissection was carried down  through the platysma and then the plane between the sternocleidomastoid and  the strap muscles were dissected bluntly until the prevertebral space was  reached.  This scar was noted to cover the previous plate; and this was  dissected from the midportion cephalad and inferiorly.  Gradually each of  the screws were uncovered and then locking screws, followed by the inserted  screws were removed.  In the lower two levels, the four screws were noted to  be rather loose in each of the holes placed.  However, there was noted be  substantial bony overgrowth over the front of the plate from the level of C5-  C6.  Counting in his neck was somewhat difficult as he already had a solid  arthrodesis at C2-3 with a very collapsed disk space; and, thus, the  assimilation of C2-3 was counted by most individuals a C2. Using this  counting technique the plate was removed and C3-C6 and then inspection of  the individual interspaces was undertaken.  At C3-4 fusion was noted be  solid.  At C4-5 there was  gross motion which was unexpected as it was felt  that most of the pseudoarthrosis was at the C5-C6 level. At C5-C6 no motion  could be instilled despite prodding with a number of devices including a tap  that was placed into the screws at C5. At C6-C7 there was noted be large  ventral osteophyte that had overgrowth the anterior portions; and this was  taken down using a combination of curettes and rongeurs.   The procedure was then continued by stripping back the longus coli muscle to  either side so as to allow exposure of the interspace, particularly at the  C4-C5 level. Here the drill was used to enter this disk space and a  significant quantity of severely degenerated material was removed. The area  was decompressed so as to allow exposure towards the posterior longitudinal  ligament.  The ligament with was actually a pseudoarthrotic capsule and this   was opened down to the level of the dura. Care was taken to decompress out  to either side so that the lateral recesses in the foramen were well  decompressed. The interspace was then shaved smooth and ultimately an 8-mm  transgraft was shaved to the appropriate size and configuration; filled with  the patient's autologous bone, that was harvested from the osteophyte  overgrowth, that had occurred at C5-C6; and this was admixed with some alpha  graft material that filled the void completely.  The interspace was then  packed with this bone graft.  At the C5-C6 level there was no motion that  was noted. Drilling of the ventral aspect to see if there was a crevice in  the disk space did not reveal any, however, a central portion was drilled  out with a channel being formed; and this was packed with the patient's  autologous bone and also some of the alpha graft.   At C6-C7 the diskectomy was then undertaken removing a large quantity of  severely degenerated disk material until the posterior longitudinal ligament  was reached. This was opened, the epidural space was then cleared out to  either side so as to decompress the foraminal areas at the C6-C7 level. Once  this was completed the similarly sized 8-mm transgraft was fashioned to fit  into the disk space. This was filled with the patient's autologous bone, in  addition to the alpha graft material.  This was tamped and countersunk. Then  a 60-mm standard size alpha tech plate was fitted with locking 4.5 x 16 mm  screws. These were fixed angle screws at C4; and at C7 these were variable  angle screws drilled into the vertebral body of C7.  At C5 and C6, 16 mm x 4  mm fixed angle screws were placed. Final localizing radiograph identified  good position of surgical construct.  Interbody grafts were well positioned.  The alignment was quite anatomic.  Then hemostasis was obtained in the prevertebral tissues; and care was taken, then, to  reapproximate the  platysma with 3-0 Vicryl suture; and 3-0 Vicryl was used in the subcuticular  tissues. Dermabond was placed on the skin. The patient tolerated procedure  well. Blood loss estimated 100 mL.      Stefani Dama, M.D.  Electronically Signed     HJE/MEDQ  D:  01/29/2005  T:  01/29/2005  Job:  [id number]

## 2010-10-03 NOTE — Discharge Summary (Signed)
[name], [name]                ACCOUNT NO.:  [id number]   MEDICAL RECORD NO.:  [id number]          PATIENT TYPE:  INP   LOCATION:  A323                          FACILITY:  [facility]   PHYSICIAN:  Donna Bernard, M.D.DATE OF BIRTH:  28-Jan-1941   DATE OF ADMISSION:  10/18/2004  DATE OF DISCHARGE:  06/04/2006LH                                 DISCHARGE SUMMARY   FINAL DIAGNOSES:  1.  Acute gastroenteritis with hypotension.  2.  Hypertension.  3.  Hyperlipidemia.  4.  Glucose intolerance.  5.  Reflux.   FINAL DISPOSITION:  1.  Patient discharged home.  2.  Discharge meds:  Cipro 500 mg one b.i.d. for 5 days.  3.  Maintain usual diet.  4.  Follow up with Dr. __________ with already scheduled visit.   INITIAL HISTORY AND PHYSICAL:  Please see H&P as dictated.   HOSPITAL COURSE:  Patient 69 year old white male with a number of chronic  medical problems as noted above who presented to the emergency room day of  admission with abdominal cramps associated with severe bouts of diarrhea.  He also had an episode of near syncope where he pretty much collapsed to the  ground and almost passed out.  On further history he had been eating a lot  of clams and crabs that he had caught down at [employer] and some of them he  ate a number of days after preparing.  Patient did notice fever and chills.  When he first came in he had a 101.9 temperature, his white blood count was  12,000, his abdomen was slightly tender.  We had to consider the possibility  of various marine species such as Vibrio so the patient was covered with  Cipro IV.  Today patient states he feels much better, he is raring to go  home, his white blood count is down to 11,000, his abdominal exam stable, he  has not had any diarrhea since yesterday.  We will cover him with Cipro due  to his potential exposure.  Patient is discharged home with diagnosis and  disposition as noted above.       WSL/MEDQ  D:  10/19/2004  T:   10/19/2004  Job:  [id number]

## 2010-10-03 NOTE — Cardiovascular Report (Signed)
[name], [name]                ACCOUNT NO.:  [id number]   MEDICAL RECORD NO.:  [id number]          PATIENT TYPE:  INP   LOCATION:  3103                         FACILITY:  [facility]   PHYSICIAN:  Everardo Beals. Juanda Chance, MD, FACCDATE OF BIRTH:  1940-08-03   DATE OF PROCEDURE:  DATE OF DISCHARGE:                            CARDIAC CATHETERIZATION   REFERRING PHYSICIAN:  Dr. Barnett Abu.   REASON FOR REFERAL:  Paroxysmal atrial fibrillation postoperatively.   CLINICAL HISTORY:  [name] is 70 years old and has a history of  hypertension and a history of paroxysmal atrial fibrillation.  He had an  episode of atrial fibrillation 2 years ago and was evaluated by Dr.  Dietrich Pates with an echocardiogram, which showed mild LVH.  He was treated  with aspirin.  His risk for thromboembolism was thought to be relatively  low, and it was treated with aspirin only.   He was admitted to the hospital on January 8th for a lumbosacral spine  surgery and had a laminectomy and fusion performed by Dr. Danielle Dess.  This  morning, he developed the onset of rapid atrial fibrillation, with  symptoms of palpitations but no chest pain or shortness of breath.  He  was treated with a Cardizem drip by Dr. Craige Cotta from [facility]  with improvement in his ventricular rate.  He is currently not having  any symptoms of palpitations.   PAST MEDICAL HISTORY:  Significant for:  1. Hypertension.  2. Hyperlipidemia.  3. GERD.  4. He had a history of a Myoview in 2000, which was negative for      ischemia.   HOME MEDICATIONS:  Include:  1. Altace 10 mg b.i.d.  2. Lipitor 40 mg daily.  3. Nexium 40 mg p.r.n.  4. Metoprolol 50 mg 2 tablets daily.  5. Ibuprofen 800 mg t.i.d.  6. Aspirin 81 mg daily.  7. Metamucil.   SOCIAL HISTORY:  Lives in [place] with his wife and his 2 children.  He is retired and does not smoke.   FAMILY HISTORY:  Positive in that his mother had a stroke in her 38s and  his father died of  a heart attack.   REVIEW OF SYSTEMS:  Positive for recent back pain related to his  lumbosacral spine disease.   PHYSICAL EXAMINATION:  VITAL SIGNS:  On examination today, his blood  pressure 115/65.  His pulse was 153 and improved to 102 with IV  Cardizem.  His O2 sat was 95%.  NECK:  There was no venous distension.  The carotid pulses were full,  without bruits.  CHEST:  Clear laterally.  CARDIAC:  Rhythm was irregular.  No murmurs or gallops.  Heart sounds  were normal.  ABDOMEN:  Soft, with normal bowel sounds.  No hepatosplenomegaly.  EXTREMITIES:  Peripheral pulse were full, and there was no peripheral  edema.  MUSCULOSKELETAL:  No deformities.  SKIN:  Warm and dry.  NEUROLOGIC:  No major neurological deficits.   His electrocardiogram showed atrial fibrillation with a rapid response  and minimal ST-T changes.   IMPRESSION:  1. Recurrent paroxysmal atrial  fibrillation, now better after IV      Cardizem, with conversion to sinus rhythm.  2. Hypertension.  3. Status post lumbar laminectomy and fusion.  4. Hyperlipidemia.  5. Renal insufficiency, resolved.   RECOMMENDATIONS:  [name] has now converted to sinus rhythm.  We will  switch his Cardizem to 30 mg p.o. q.6 hours in addition to his  metoprolol 50 mg twice a day.  If he remains stable, I do not think he  will need any further evaluation or treatment, but we will switch his  Cardizem to long-acting Cardizem and probably plan discharge on  Cardizem.  We will plan an outpatient echocardiogram and defer the  decision about long-term anticoagulation and consideration for long-term  rhythm control to Dr. Dietrich Pates.  Patient does indicate that he has had a  moderate amount of symptoms, with about 5 episodes of recurrence over  the last year, lasting about an hour each.      Bruce Elvera Lennox Juanda Chance, MD, [facility]  Electronically Signed     BRB/MEDQ  D:  05/28/2006  T:  05/28/2006  Job:  [id number]   cc:   Stefani Dama, M.D.   [name], MD  Gerrit Friends. Dietrich Pates, MD, [facility]

## 2010-10-03 NOTE — Letter (Signed)
June 14, 2006    [name], MD  [address]  [place], [place] [address]   RE:  [name], [name]  MRN:  [id number]  /  DOB:  [birth year]   Dear Lorin Picket:   [name] returns to the office after a recent hospital admission to  [facility] for extensive lumbosacral spine surgery. Postoperatively he  developed atrial fibrillation. This was a minor perioperative  complication that was easily managed with medication. He reverted to  sinus rhythm and has felt well since from a cardiac standpoint. He also  has achieved excellent relief of pain.   CURRENT MEDICATIONS:  1. Ramipril 10 mg daily.  2. Metoprolol 50 mg b.i.d.  3. Nexium 40 mg daily p.r.n.  4. Atorvastatin 40 mg daily.  5. Aspirin 81 mg daily.   PHYSICAL EXAMINATION:  GENERAL:  A pleasant gentleman in a corset in no  acute distress.  VITAL SIGNS:  Weight 174 18 pounds less than in January 2006, blood  pressure 135/75, heart rate 70 and regular, respirations 16.  NECK:  No jugular venous distention; normal carotid upstrokes without  bruits.  LUNGS:  Clear.  CARDIAC:  Normal first and second heart sounds.  ABDOMEN:  Soft and nontender.   Rhythm strip:  Normal sinus rhythm; normal intervals.   IMPRESSION:  [name] is doing generally well. Medical therapy is  appropriate. His dose of atorvastatin was increased in the hospital. You  may wish to obtain a followup lipid profile. I will plan to see this  nice gentleman again in one year or earlier should atrial fibrillation  recur.    Sincerely,      Gerrit Friends. Dietrich Pates, MD, [facility]  Electronically Signed    RMR/MedQ  DD: 06/14/2006  DT: 06/14/2006  Job #: [id number]

## 2010-10-03 NOTE — H&P (Signed)
[name], [name]                ACCOUNT NO.:  [id number]   MEDICAL RECORD NO.:  [id number]          PATIENT TYPE:  INP   LOCATION:  2550                         FACILITY:  [facility]   PHYSICIAN:  Stefani Dama, M.D.  DATE OF BIRTH:  12-23-1940   DATE OF ADMISSION:  05/25/2006  DATE OF DISCHARGE:                              HISTORY & PHYSICAL   PREOPERATIVE DIAGNOSES:  1. Spondylosis and stenosis at L2-3, L3-4, L4-5.  2. Previous diskectomy at L4-5 and L3-4.  3. Scoliosis, spondylolisthesis, L3-L4, with lumbar radiculopathy.   POSTOPERATIVE DIAGNOSES:  1. Spondylosis and stenosis at L2-3, L3-4, L4-5.  2. Previous diskectomy at L4-5 and L3-4.  3. Scoliosis, spondylolisthesis, L3-L4, with lumbar radiculopathy.   PROCEDURES:  1. L2, L3-L4 laminectomy.  2. Decompression of L2, L3, L4, and L5 nerve roots bilaterally.  3. Bilateral total diskectomy at L2-3, L3-4, and L4-5.  4. Posterior lumbar interbody arthrodesis with PEEK spacers, local      autograft, and allograft.  5. Segmental fixation L1-L5, with pedicle screws.  6. Posterolateral arthrodesis with local autograft and allograft.   SURGEON:  Stefani Dama, M.D.   FIRST ASSISTANT:  Danae Orleans. Venetia Maxon, M.D.   ANESTHESIA:  General endotracheal.   INDICATIONS:  [name] is a 70 year old individual who has had  significant problems with lumbar spondylosis in the past.  He underwent  two diskectomies for disc herniation at L3-L4 in the extraforaminal  space and at L4-L5 centrally. He has had progressive difficulties with  back pain and bilateral lower extremity pain, with severe radiculopathy,  including weakness in lower extremities.  He has had a history of severe  cervical spondylosis in the past also. After careful consideration of  his options, a recent myelogram demonstrated that he had a high-grade  stenosis, with features of spondylolisthesis at L3-4, along with  scoliosis in the lower lumbar spine. He was advised  regarding surgical  decompression and stabilization from L1-L5.  He is taken to the  operating room now for that procedure.   PROCEDURE:  The patient was brought to the operating room supine on the  stretcher. After smooth induction of general endotracheal anesthesia and  placement of appropriate arterial and venous monitoring lines and Foley  catheter, the back was prepped with DuraPrep and dressed sterilely. The  midline of the back was opened using an elliptical incision around his  previous scar in the lower lumbar spine.  This was excised. Dissection  was carried down to the lumbodorsal fascia, and the first spinous  processes identified were noted be that of L3. Further localization  identified from L1 down to the L4 spinous processes, and then a  subperiosteal dissection was performed and carried out over the facette  joints. Then, after decorticating much of the spinous processes, the  spinous processes and laminar arches were removed using a Luhr-Stille  rongeur.  The spinous processes and laminar bone was saved for later use  as autograft. Laminectomy was then uncovered from L4, and the superior  margin of L5 was also removed to expose the interspace at L4-L5. The  dissection was carried out laterally, and laminectomy was then performed  superiorly to remove the laminar arches of L4, L3, and ultimately L2 in  total.  Facette overgrowth was noted particularly on the left side at  the L3-L4 space.  This was taken up to decompress the common dural tube.  Markedly thickened redundant yellow ligament was also encountered in  this area contributing to the stenosis. Care was taken to do the  decompression very carefully. Adherence to the dura in the lateral  recesses was identified, and this was carefully released.  No spinal  fluid leaks occurred during this entire dissection of each of the nerve  roots.  Decompressed them sequentially from L2 down to L5. The disc  spaces were noted  be severely overgrown, and there was a  spondylolisthesis that needed to be released at the L3-L4 level.  This  was performed by performing radical diskectomies at L2, L3-4, and L4-5,  which facilitated the decompression and also allowed for decompression  of lateral recesses, particularly at L3-L4, where the spondylolisthesis  was aggravating this condition. PEEK spacers were then fashioned and  placed into the interspaces after the bony endplates were adequately  decorticated using a combination of curettes and rongeurs to perform a  total and complete diskectomy at each one of these spaces.  Once the end  plates were decorticated, 10 mm spacers were then fitted in L3-4 and L4-  5.  A combination of autograft and allograft was packed into the  interspaces.  Bone was tamped into the interspaces.  This was performed  at each of the levels as noted. Then, pedicle entry sites were chosen  from L1-L5.  These were sequentially placed with the use of fluoroscopic  guidance. Pedicle probes were placed, each into the pedicles, first on  the left side from L1-L5, and then on the right side from L5-L1.  Fluoroscopic guidance was used to place each of the probes, and then  each of the probes was tapped individually with a 6.5 mm tap.  The  cortex was checked for cutout, and no cutout was identified along any of  the pads of the nerve roots. Visual inspection of the cortical surfaces  was also undertaken from L2 down to L5.  L1 could not be visualized, as  a laminectomy at L1-2 had not been completed.  6.5 x 50 mm screws were  placed in L1.  L2 had a 6.5 x 45 mm screws all the way through L4.  L5  had 7.5 x 45 mm sacral screws placed in that bone.  With the screws  being placed, final localizing radiographs were performed, and then 120  mm pre-contoured rods were placed between each of the screws. This was  secured, and derotation was performed to reduce the scoliosis that the patient had. This  allowed for a straighter construct.  Ultimately, the  system was locked into position. Posterolateral bone graft was placed in  the lateral gutters, which were decorticated prior to placing all the  hardware. The facette joints at the L1-L2 space had been completely  decorticated, and they were packed with a combination of autograft and  allograft.  The posterolateral bone graft was placed. The interspaces  were packed with the graft and allograft, and hemostasis was obtained in  the soft tissues. The large Hemovac drain was left in the wounds, and  the lumbodorsal fascia was closed over this. At the end of the  procedure, entire blood loss was approximately  2500 cc; 1050 cc of Cell  Saver blood were returned to the patient. He received crystalloid and  colloid fluid support.  The patient will be maintained in the intensive  care unit during the postoperative phases. He seemed to tolerate the  procedure well and was returned to the recovery room in stable  condition.      Stefani Dama, M.D.  Electronically Signed     HJE/MEDQ  D:  05/25/2006  T:  05/26/2006  Job:  [id number]

## 2010-11-10 ENCOUNTER — Ambulatory Visit: Payer: Medicare Other | Admitting: Gastroenterology

## 2010-11-20 NOTE — Op Note (Signed)
[name], [name]                ACCOUNT NO.:  [id number]  MEDICAL RECORD NO.:  [id number]           PATIENT TYPE:  I  LOCATION:  3103                         FACILITY:  [facility]  PHYSICIAN:  Stefani Dama, M.D.  DATE OF BIRTH:  1940-12-27  DATE OF PROCEDURE:  09/18/2010 DATE OF DISCHARGE:                              OPERATIVE REPORT   PREOPERATIVE DIAGNOSES:  Lumbar spondylosis and herniated nucleus pulposus with left lumbar radiculopathy L5-S1 status post decompression and fusion L2-L5.  POSTOPERATIVE DIAGNOSES:  Lumbar spondylosis and herniated nucleus pulposus with left lumbar radiculopathy L5-S1 status post decompression and fusion L2-L5.  PROCEDURES:  Decompression of L5-S1 with bilateral laminotomies, foraminotomies and diskectomies with greater than necessary for simple diskectomy, posterior lumbar interbody arthrodesis using PEEK spacers, local autograft and allograft.  Pedicle screw fixation from previous arthrodesis from L2-L5 to include the sacrum.  Posterolateral arthrodesis with local autograft and allograft.  SURGEON:  Stefani Dama, MD  FIRST ASSISTANT:  Dr. Coletta Memos.  ANESTHESIA:  General endotracheal.  INDICATIONS:  [name] is a 70 year old individual who has had significant problems with back pain and bilateral lower extremity pain. He has had a previous decompression and fusion from L2-L5 and now has advanced degenerative changes at L5-S1 with a large extruded fragment of disk and vacuum disk phenomenon on the left side at L5-S1 in addition to advanced spondylosis in that lower lumbar segment.  The patient was advised regarding a need for surgical decompression and stabilization and is now taken to the operating room for this procedure.  PROCEDURE IN DETAILS:  The patient was brought to the operating room supine on the stretcher.  After smooth induction of general endotracheal anesthesia, he had a Foley catheter placed and was then  carefully turned to the prone position.  The back was prepped with alcohol and DuraPrep and draped in a sterile fashion.  Previously made lower portion lumbar incision was reopened and dissection was carried little bit inferiorly. Lumbodorsal fascia was then opened off either side of midline to expose the spinous process at L5 and dissection was carried out laterally to expose the hardware at L5 and L4.  Ultimately I was able to expose the laminar arch of L5 and this was then carefully dissected and the spinous process and lamina was removed in a piecemeal fashion with a high-speed bur.  As the bur was used to thin the bone above the dura, a small dural incursion occurred.  This was easily controlled with a singular Prolene stitch.  The dissection was then further carried out to expose and decompress the dura centrally and into the lateral recesses.  The L5 nerve root on the left side was particularly noted to be squashed by a significant mass underneath it.  This could be carefully dissected free from the dura and was found be a markedly degenerated disk with gaseous material within it.  This was carefully dissected and pieces of disk material in addition to this fibrous capsule was removed.  There was a little bit of fluid within the capsule suggesting that this was similar to a synovial cyst, however, came  from the region of the disk space itself.  Ultimately the L5 nerve root could be decompressed way out into the foramen.  Disk space was then entered and there was noted to be severely sclerotic series of disk shavers were used to remove endplate material from L5 and S1 both medially and laterally and ultimately the disk space was decompressed.  A sizer was used to size disk space to 8 mm size.  Interbody graft was then placed after decorticating first from the left side and then from the right side and also using a sizer to increase the size of the interspace; 9 mm PEEK pacers were  then filled with combination of Vitoss mixed with a stem cell prep and this was packed into the cages and then packed into the interspace.  Interspace was similarly filled with additional Vitoss.  Once the spacers were placed, fluoroscopy was brought into view so we could place pedicle screws into the sacrum.  These were 6.5 x 55 mm pedicle screws that were tapped through both cortices dorsal and ventral.  Once the screws were placed, cantilever extension from the rod between L4 and L5 was placed to allow fixation of a separate rod to the sacral screws.  These were placed and then secured and torqued to the appropriate torque.  The final x-rays were then obtained in AP and lateral projections.  Lateral gutters which had been previously decorticated were then packed with additional Vitoss and once this was accomplished, the wound was checked for hemostasis.  The pads of the exiting nerve roots both at L5 and S1 were checked for patency and when this was verified, hemostasis in the wound was obtained and the lumbodorsal fascia was closed with #1 Vicryl interrupted fashion, 2-0 Vicryl using subcutaneous tissues and 3-0 Vicryl subcuticularly.  Blood loss estimated 300 mL.  The patient tolerated the procedure well; 150 mL of Cell Saver blood was returned. The patient was then returned to recovery room in stable condition.     Stefani Dama, M.D.     [name]  D:  09/18/2010  T:  09/19/2010  Job:  [id number]  Electronically Signed by Barnett Abu M.D. on 11/20/2010 04:56:15 PM

## 2010-11-20 NOTE — Discharge Summary (Signed)
  [name], [name]                ACCOUNT NO.:  [id number]  MEDICAL RECORD NO.:  [id number]  LOCATION:  3017                         FACILITY:  [facility]  PHYSICIAN:  Stefani Dama, M.D.  DATE OF BIRTH:  1941/04/04  DATE OF ADMISSION:  09/18/2010 DATE OF DISCHARGE:  09/21/2010                              DISCHARGE SUMMARY   ADMITTING DIAGNOSES: 1. Lumbar spondylosis. 2. Herniated nucleus pulposus. 3. Left lumbar radiculopathy, L5-S1. 4. Status post decompression and fusion, L2-L5.  DISCHARGE DIAGNOSES: 1. Lumbar spondylosis. 2. Herniated nucleus pulposus. 3. Left lumbar radiculopathy, L5-S1. 4. Status post decompression and fusion, L2-L5.  OPERATIONS AND PROCEDURES:  Decompression L5-S1 bilateral laminotomies, foraminotomies, and revision pedicle screw fixation L2 to sacrum with posterolateral arthrodesis.  BRIEF HISTORY AND HOSPITAL COURSE:  [name] is a 70 year old gentleman, has significant problems of back pain, bilateral lower extremity pain, previous decompression L2-L5, and now has advanced degenerative changes at L5-S1 with a large extruded disk fragment, vacuum disk, left lower extremity radiculopathy, and centralized low back pain with advanced spondylosis.  The patient was advised on surgical decompression and stabilization, tolerated the procedure well on Sep 18, 2010.  Postoperatively, he was placed in the neurosurgical ICU.  He was placed on a PCA Dilaudid pump first day postoperatively. The patient was neurologically intact.  Motor sensation within normal limits.  This wound was benign.  Second day postoperatively, he was taking p.o. well.  Foley catheter was discontinued.  He was transferred to 3000.  PCA was discontinued.  IV fluids were hep-locked.  He is ready for discharge home on Saturday.  He was ambulating safely with Physical Therapy, worked with Physical Therapy, Occupational Therapy prior to discharge home.  He was afebrile, vital signs  stable, neurovascularly intact, eating well, voiding well, and ready for discharge home on Saturday, Sep 21, 2010.  Given prescription for Percocet 10/325, 1-2 p.o. q.4 hours p.r.n. pain.  Given prescription for Valium 5 mg 1 p.o. q.6 hours p.r.n. muscle spasm.  He is to follow up in our office in 3-4 weeks.  Continue back precautions.  Use Aspen QuickDraw brace for ambulation when he is up.  Contact our office prior to follow up if he has any question or concerns.  He may shower.  Regular diet.  HOME MEDICATIONS: 1. Pradaxa 150 mg b.i.d. 2. Ramipril 10 mg b.i.d. 3. Pravastatin 20 mg q.p.m. 4. Oxycodone 10 mg 4 times a day p.r.n. pain. 5. Indapamide 1.25 mg p.o. daily in the morning. 6. Nexium 40 mg p.o. daily. 7. Metamucil daily p.r.n. 8. Valium and Percocet prescription were given.  Follow up with Dr. Danielle Dess in 3 weeks.  All questions were encouraged, answered, and addressed.  DISCHARGE CONDITION:  Stable, improved.     Aura Fey [facility].   ______________________________ Stefani Dama, M.D.    SCI/MEDQ  D:  10/29/2010  T:  10/30/2010  Job:  [id number]  Electronically Signed by Orlin Hilding P.A. on 11/03/2010 02:48:43 PM Electronically Signed by Barnett Abu M.D. on 11/20/2010 04:56:29 PM

## 2010-11-27 ENCOUNTER — Ambulatory Visit: Payer: Self-pay | Admitting: Gastroenterology

## 2010-12-31 ENCOUNTER — Telehealth: Payer: Self-pay | Admitting: Internal Medicine

## 2010-12-31 NOTE — Telephone Encounter (Signed)
Pt has question regarding upcoming ct scan - medication coumadin /  praxada.

## 2010-12-31 NOTE — Telephone Encounter (Signed)
Called and lmom for pt to call me back on Fri  He should not be on both Pradaxa and Coumadin

## 2011-01-01 ENCOUNTER — Other Ambulatory Visit: Payer: Self-pay | Admitting: Neurological Surgery

## 2011-01-01 DIAGNOSIS — M5126 Other intervertebral disc displacement, lumbar region: Secondary | ICD-10-CM

## 2011-01-06 ENCOUNTER — Ambulatory Visit
Admission: RE | Admit: 2011-01-06 | Discharge: 2011-01-06 | Disposition: A | Discharge status: Home / Self Care | Payer: Medicare Other | Source: Ambulatory Visit | Attending: Neurological Surgery | Admitting: Neurological Surgery

## 2011-01-06 DIAGNOSIS — M5126 Other intervertebral disc displacement, lumbar region: Secondary | ICD-10-CM

## 2011-01-06 NOTE — Telephone Encounter (Signed)
lmom for pt to call me back will try on cell  Called pt on cell phone Called patient and let him know he does not have stop Pradaxa for CT scan  He is aware and had the CT scan done today

## 2011-02-02 ENCOUNTER — Telehealth: Payer: Self-pay | Admitting: Internal Medicine

## 2011-02-02 NOTE — Telephone Encounter (Signed)
Called requesting a Pradaxa card. We have one available. Advised pt to pick up but he needs to activate card so he can get RX for 1 yr.

## 2011-02-02 NOTE — Telephone Encounter (Signed)
Pt calling needing another card to help him order and/or pay for pradaxa. Please return pt call to discuss further.

## 2011-02-05 ENCOUNTER — Other Ambulatory Visit: Payer: Self-pay | Admitting: Internal Medicine

## 2011-02-09 LAB — DIFFERENTIAL
Band Neutrophils: 0
Basophils Relative: 1
Blasts: 0
Eosinophils Relative: 0
Lymphocytes Relative: 16
Metamyelocytes Relative: 0
Monocytes Relative: 4
Myelocytes: 0
Neutrophils Relative %: 79 — ABNORMAL HIGH
Promyelocytes Absolute: 0
Smear Review: ADEQUATE
nRBC: 0

## 2011-02-09 LAB — BASIC METABOLIC PANEL
BUN: 22
CO2: 27
Calcium: 8.7
Chloride: 100
Creatinine, Ser: 0.91
GFR calc Af Amer: >60
GFR calc non Af Amer: >60
Glucose, Bld: 135 — ABNORMAL HIGH
Potassium: 3.5
Sodium: 136

## 2011-02-09 LAB — CBC
HCT: 45.6
Hemoglobin: 15.5
MCHC: 34
MCV: 91
Platelets: ADEQUATE
RBC: 5.01
RDW: 13.6
WBC: 5.7

## 2011-02-09 LAB — AMYLASE: Amylase: 66

## 2011-02-09 LAB — LIPASE, BLOOD: Lipase: 27

## 2011-02-12 ENCOUNTER — Ambulatory Visit (HOSPITAL_COMMUNITY)
Admission: RE | Admit: 2011-02-12 | Discharge: 2011-02-12 | Disposition: A | Discharge status: Home / Self Care | Payer: Medicare Other | Source: Ambulatory Visit | Attending: Family Medicine | Admitting: Family Medicine

## 2011-02-12 ENCOUNTER — Other Ambulatory Visit: Payer: Self-pay | Admitting: Family Medicine

## 2011-02-12 DIAGNOSIS — M25549 Pain in joints of unspecified hand: Secondary | ICD-10-CM | POA: Insufficient documentation

## 2011-02-12 DIAGNOSIS — T1490XA Injury, unspecified, initial encounter: Secondary | ICD-10-CM

## 2011-02-12 DIAGNOSIS — W230XXA Caught, crushed, jammed, or pinched between moving objects, initial encounter: Secondary | ICD-10-CM | POA: Insufficient documentation

## 2011-02-12 DIAGNOSIS — S6990XA Unspecified injury of unspecified wrist, hand and finger(s), initial encounter: Secondary | ICD-10-CM | POA: Insufficient documentation

## 2011-02-24 LAB — PROTIME-INR
INR: 1
INR: 1.1
INR: 1.2
INR: 1.4
Prothrombin Time: 13.9
Prothrombin Time: 14.9
Prothrombin Time: 15.5 — ABNORMAL HIGH
Prothrombin Time: 17.7 — ABNORMAL HIGH

## 2011-02-24 LAB — BASIC METABOLIC PANEL
BUN: 28 — ABNORMAL HIGH
CO2: 28
Calcium: 9.1
Chloride: 105
Creatinine, Ser: 1.47
GFR calc Af Amer: 58 — ABNORMAL LOW
GFR calc non Af Amer: 48 — ABNORMAL LOW
Glucose, Bld: 106 — ABNORMAL HIGH
Potassium: 3.9
Sodium: 139

## 2011-02-24 LAB — DIFFERENTIAL
Basophils Absolute: 0
Basophils Absolute: 0.1
Basophils Absolute: 0.1
Basophils Absolute: 0.1
Basophils Relative: 0
Basophils Relative: 1
Basophils Relative: 1
Basophils Relative: 1
Eosinophils Absolute: 0.1 — ABNORMAL LOW
Eosinophils Absolute: 0.2
Eosinophils Absolute: 0.2
Eosinophils Absolute: 0.3
Eosinophils Relative: 1
Eosinophils Relative: 3
Eosinophils Relative: 3
Eosinophils Relative: 4
Lymphocytes Relative: 17
Lymphocytes Relative: 18
Lymphocytes Relative: 20
Lymphocytes Relative: 24
Lymphs Abs: 1.6
Lymphs Abs: 1.7
Lymphs Abs: 1.7
Lymphs Abs: 1.8
Monocytes Absolute: 0.8
Monocytes Absolute: 1.1 — ABNORMAL HIGH
Monocytes Absolute: 1.3 — ABNORMAL HIGH
Monocytes Absolute: 1.5 — ABNORMAL HIGH
Monocytes Relative: 12
Monocytes Relative: 13 — ABNORMAL HIGH
Monocytes Relative: 14 — ABNORMAL HIGH
Monocytes Relative: 15 — ABNORMAL HIGH
Neutro Abs: 4.1
Neutro Abs: 4.9
Neutro Abs: 6.5
Neutro Abs: 6.6
Neutrophils Relative %: 60
Neutrophils Relative %: 62
Neutrophils Relative %: 66
Neutrophils Relative %: 66

## 2011-02-24 LAB — D-DIMER, QUANTITATIVE: D-Dimer, Quant: 2.24 — ABNORMAL HIGH

## 2011-02-24 LAB — CBC
HCT: 37.3 — ABNORMAL LOW
HCT: 39.5
HCT: 39.5
HCT: 41.5
Hemoglobin: 12.7 — ABNORMAL LOW
Hemoglobin: 13.3
Hemoglobin: 13.4
Hemoglobin: 14
MCHC: 33.7
MCHC: 33.7
MCHC: 34
MCHC: 34.1
MCV: 89.5
MCV: 90
MCV: 90.1
MCV: 91.2
Platelets: 259
Platelets: 277
Platelets: 295
Platelets: 299
RBC: 4.17 — ABNORMAL LOW
RBC: 4.33
RBC: 4.38
RBC: 4.61
RDW: 13.3
RDW: 13.4
RDW: 13.5
RDW: 13.7
WBC: 10.1
WBC: 6.9
WBC: 8
WBC: 9.9

## 2011-02-24 LAB — APTT: aPTT: 34

## 2011-04-08 ENCOUNTER — Telehealth: Payer: Self-pay | Admitting: Internal Medicine

## 2011-04-08 NOTE — Telephone Encounter (Signed)
Will ask Dr Johney Frame what to change him too

## 2011-04-08 NOTE — Telephone Encounter (Signed)
New problem Pt said pradaxa is too expensive. He wants to change to another med. Please call

## 2011-04-09 NOTE — Telephone Encounter (Signed)
We could try Xarelto to see if that is cheaper. Given his h/o PTE, this would be a good option. Otherwise, he should probably take coumadin.

## 2011-04-13 ENCOUNTER — Other Ambulatory Visit (INDEPENDENT_AMBULATORY_CARE_PROVIDER_SITE_OTHER): Payer: Self-pay | Admitting: *Deleted

## 2011-04-13 ENCOUNTER — Ambulatory Visit (INDEPENDENT_AMBULATORY_CARE_PROVIDER_SITE_OTHER): Payer: Medicare Other | Admitting: Internal Medicine

## 2011-04-13 ENCOUNTER — Encounter (INDEPENDENT_AMBULATORY_CARE_PROVIDER_SITE_OTHER): Payer: Self-pay | Admitting: *Deleted

## 2011-04-13 ENCOUNTER — Encounter (INDEPENDENT_AMBULATORY_CARE_PROVIDER_SITE_OTHER): Payer: Self-pay | Admitting: Internal Medicine

## 2011-04-13 VITALS — BP 120/74 | HR 78 | Temp 98.6°F | Resp 16 | Ht 69.0 in | Wt 197.0 lb

## 2011-04-13 DIAGNOSIS — Z800 Family history of malignant neoplasm of digestive organs: Secondary | ICD-10-CM

## 2011-04-13 DIAGNOSIS — Z1211 Encounter for screening for malignant neoplasm of colon: Secondary | ICD-10-CM

## 2011-04-13 NOTE — Patient Instructions (Signed)
Colonoscopy to be scheduled. No Pradaxa day before and day of procedure.

## 2011-04-13 NOTE — H&P (Signed)
[id number]

## 2011-04-14 NOTE — Telephone Encounter (Signed)
Lmom for patient to call me back in regards to the medication and it's expense

## 2011-04-14 NOTE — H&P (Signed)
[name], [name]                ACCOUNT NO.:  [id number]  MEDICAL RECORD NO.:  [id number]  LOCATION:                                 FACILITY:  PHYSICIAN:  Lionel December, M.D.    DATE OF BIRTH:  [birth year]  DATE OF ADMISSION:  04/13/2011 DATE OF DISCHARGE:  LH                             HISTORY & PHYSICAL   PRESENTING COMPLAINT:  The patient is here to discuss the procedure with colonoscopy.  HISTORY OF PRESENT ILLNESS:  The patient is a 70 year old Caucasian male who was last seen in [place] office in April 2011 regarding colonoscopy. However at that time, he was anticoagulated and Lovenox bridge was advised by his specialist.  The patient could not afford the cost of Lovenox bridging, therefore this procedure was not schedule.  He is now on Pradaxa and he tells me that he can come off this medication for few days.  He recently had back surgery and was often for 7 days, had no problems.  He denies abdominal pain, melena, or rectal bleeding.  He is on Metamucil and he believes that regulates his bowels, he has a BM every day.  He also denies anorexia or weight loss.  He rarely experiences heartburn when eats [place] food, easily controlled with OTC PPI.  He may take it once a month or less frequently.  He denies dysphagia, nausea, or vomiting.  He has chronic low back pain.  He states since his surgery in May this year and fact that he is using support and spinal stimulator, he feels a lot better.  REVIEW OF THE SYSTEMS:  Positive for increased urinary frequency.  He is going to the bathroom every hour or so, and at least 5 times each night. He has an appointment with the urologist within the next 2 weeks.  CURRENT MEDICATIONS: 1. Lozol 1.25 mg p.o. q.a.m. 2. Oxycodone 10 mg p.o. q.i.d. p.r.n. 3. Pradaxa 150 mg p.o. b.i.d. 4. Pravastatin 40 mg p.o. q.a.m. 5. Ramipril 10 mg p.o. q.a.m. 6. Metamucil 1 tablespoonful daily.  PAST MEDICAL HISTORY: 1. History of paroxysmal  atrial fibrillation, status post ablation     therapy 3 or 4 years ago with good results. 2. History of left DVT complicated by pulmonary embolism in 2009. 3. Hypertension. 4. Hyperlipidemia. 5. He had lumbar spine surgery in May 2012 and he now has hardware in     the lumbar region.  All in all, he has had 5 surgeries on his low     back and he had neck surgery on 3 different occasions. 6. He had right knee arthroscopy over 20 years ago. 7. The patient had flexible sigmoidoscopy in 1995 and had proctitis. His last colonoscopy was in May 2002 and was a normal exam.  ALLERGIES:  SULFA.  FAMILY HISTORY:  Father died of MI in his 2s and mother died of CVA at age 60.  He had a brother age 88 who died last month, he was brittle diabetic.  One sister died of MI at age 1.  She also was treated for colon and breast carcinoma.  SOCIAL HISTORY:  He is married.  He has 2 daughters.  One  of his daughters had permanent pacemaker rhythm problems when she was in her 30s.  She is now in the mid 40s and doing fine.  The patient has never smoked cigarettes and he has not had any alcohol in the last 3 years and prior to that he used to smoke occasionally.  He worked at [employer] for 28 years prior to his retirement.  OBJECTIVE:  VITAL SIGNS:  Weight 197 pounds, he is 69 inches tall, pulse 78 per minute, blood pressure 120/74, temp is 98.6.  Repeat pulse 78 per minute, blood pressure 120/74, respirations 16, and temp is 98.6. HEENT:  Conjunctivae are pink.  Sclerae nonicteric.  Oropharyngeal mucosa is normal. NECK:  No neck masses or thyromegaly noted. CARDIAC:  Regular rhythm.  Normal S1 and S2.  No murmur or gallop noted. LUNGS:  Clear to auscultation. ABDOMEN:  Symmetrical.  Bowel sounds are normal.  No bruits noted.  On palpation, soft and nontender abdomen without organomegaly or masses. RECTAL:  Deferred. EXTREMITIES:  No peripheral edema or clubbing noted.  ASSESSMENT:  The patient  is a 70 year old Caucasian male who is interested in having colonoscopy primarily for screening purposes.  His last exam was in May 2002.  Family history is positive for colon carcinoma in his sister who died of MI at age 6.  Exact age at the time of diagnosis of CRC is unknown.  The patient's last exam was over 10 years ago and he is due for 1 now.  RECOMMENDATIONS:  We will schedule him for colonoscopy in near future. The patient will stop Pradaxa 1 day prior to the procedure.  I have reviewed the procedure risks with the patient and he is agreeable.                                           ______________________________ Lionel December, M.D.     NR/MEDQ  D:  04/13/2011  T:  04/14/2011  Job:  [id number]  cc:   Lorin Picket A. Gerda Diss, MD Fax: [phone]

## 2011-04-17 NOTE — Progress Notes (Signed)
PRESENTING COMPLAINT: The patient is here to discuss the procedure with  colonoscopy.  HISTORY OF PRESENT ILLNESS: The patient is a 70 year old Caucasian male  who was last seen in [place] office in April 2011 regarding colonoscopy.  However at that time, he was anticoagulated and Lovenox bridge was  advised by his specialist. The patient could not afford the cost of  Lovenox bridging, therefore this procedure was not schedule. He is now  on Pradaxa and he tells me that he can come off this medication for few  days. He recently had back surgery and was often for 7 days, had no  problems.  He denies abdominal pain, melena, or rectal bleeding. He is on  Metamucil and he believes that regulates his bowels, he has a BM every  day. He also denies anorexia or weight loss. He rarely experiences  heartburn when eats [place] food, easily controlled with OTC PPI. He  may take it once a month or less frequently. He denies dysphagia,  nausea, or vomiting. He has chronic low back pain. He states since his  surgery in May this year and fact that he is using support and spinal  stimulator, he feels a lot better.  REVIEW OF THE SYSTEMS: Positive for increased urinary frequency. He is  going to the bathroom every hour or so, and at least 5 times each night.  He has an appointment with the urologist within the next 2 weeks.  CURRENT MEDICATIONS:  1. Lozol 1.25 mg p.o. q.a.m.  2. Oxycodone 10 mg p.o. q.i.d. p.r.n.  3. Pradaxa 150 mg p.o. b.i.d.  4. Pravastatin 40 mg p.o. q.a.m.  5. Ramipril 10 mg p.o. q.a.m.  6. Metamucil 1 tablespoonful daily.  PAST MEDICAL HISTORY:  1. History of paroxysmal atrial fibrillation, status post ablation  therapy 3 or 4 years ago with good results.  2. History of left DVT complicated by pulmonary embolism in 2009.  3. Hypertension.  4. Hyperlipidemia.  5. He had lumbar spine surgery in May 2012 and he now has hardware in  the lumbar region. All in all, he has had 5  surgeries on his low  back and he had neck surgery on 3 different occasions.  6. He had right knee arthroscopy over 20 years ago.  7. The patient had flexible sigmoidoscopy in 1995 and had proctitis.  His last colonoscopy was in May 2002 and was a normal exam.  ALLERGIES: SULFA.  FAMILY HISTORY: Father died of MI in his 70s and mother died of CVA at  age 70. He had a brother age 43 who died last month, he was brittle  diabetic. One sister died of MI at age 91. She also was treated for  colon and breast carcinoma.  SOCIAL HISTORY: He is married. He has 2 daughters. One of his  daughters had permanent pacemaker rhythm problems when she was in her  30s. She is now in the mid 40s and doing fine. The patient has never  smoked cigarettes and he has not had any alcohol in the last 3 years and  prior to that he used to smoke occasionally. He worked at [employer] for 28 years prior to his retirement.  OBJECTIVE: VITAL SIGNS: Weight 197 pounds, he is 69 inches tall, pulse  78 per minute, blood pressure 120/74, temp is 98.6. Repeat pulse 78 per  minute, blood pressure 120/74, respirations 16, and temp is 98.6.  HEENT: Conjunctivae are pink. Sclerae nonicteric. Oropharyngeal  mucosa is normal.  NECK:  No neck masses or thyromegaly noted.  CARDIAC: Regular rhythm. Normal S1 and S2. No murmur or gallop noted.  LUNGS: Clear to auscultation.  ABDOMEN: Symmetrical. Bowel sounds are normal. No bruits noted. On  palpation, soft and nontender abdomen without organomegaly or masses.  RECTAL: Deferred.  EXTREMITIES: No peripheral edema or clubbing noted.  ASSESSMENT: The patient is a 70 year old Caucasian male who is  interested in having colonoscopy primarily for screening purposes. His  last exam was in May 2002. Family history is positive for colon  carcinoma in his sister who died of MI at age 79. Exact age at the time  of diagnosis of CRC is unknown. The patient's last exam was over 10  years  ago and he is due for 1 now.  RECOMMENDATIONS: We will schedule him for colonoscopy in near future.  The patient will stop Pradaxa 1 day prior to the procedure.  I have reviewed the procedure risks with the patient and he is  agreeable.

## 2011-04-21 NOTE — Telephone Encounter (Signed)
Spoke with patient and he is in the doughnut whole and got a little upset that he is having to pay full price for his medications  I have let him know I can get him some samples to get him over the hump until the first of year

## 2011-04-22 ENCOUNTER — Encounter (HOSPITAL_COMMUNITY): Payer: Self-pay | Admitting: Pharmacy Technician

## 2011-04-23 MED ORDER — SODIUM CHLORIDE 0.45 % IV SOLN
Freq: Once | INTRAVENOUS | Status: AC
  Administered 2011-04-24: 11:00:00 via INTRAVENOUS

## 2011-04-24 ENCOUNTER — Encounter (HOSPITAL_COMMUNITY)
Admission: RE | Disposition: A | Discharge status: Home / Self Care | Payer: Self-pay | Source: Ambulatory Visit | Attending: Internal Medicine

## 2011-04-24 ENCOUNTER — Other Ambulatory Visit (INDEPENDENT_AMBULATORY_CARE_PROVIDER_SITE_OTHER): Payer: Self-pay | Admitting: Internal Medicine

## 2011-04-24 ENCOUNTER — Encounter (HOSPITAL_COMMUNITY): Payer: Self-pay | Admitting: *Deleted

## 2011-04-24 ENCOUNTER — Ambulatory Visit (HOSPITAL_COMMUNITY)
Admission: RE | Admit: 2011-04-24 | Discharge: 2011-04-24 | Disposition: A | Discharge status: Home / Self Care | Payer: Medicare Other | Source: Ambulatory Visit | Attending: Internal Medicine | Admitting: Internal Medicine

## 2011-04-24 DIAGNOSIS — K5730 Diverticulosis of large intestine without perforation or abscess without bleeding: Secondary | ICD-10-CM | POA: Insufficient documentation

## 2011-04-24 DIAGNOSIS — D126 Benign neoplasm of colon, unspecified: Secondary | ICD-10-CM

## 2011-04-24 DIAGNOSIS — Z800 Family history of malignant neoplasm of digestive organs: Secondary | ICD-10-CM

## 2011-04-24 DIAGNOSIS — Z79899 Other long term (current) drug therapy: Secondary | ICD-10-CM | POA: Insufficient documentation

## 2011-04-24 DIAGNOSIS — Z1211 Encounter for screening for malignant neoplasm of colon: Secondary | ICD-10-CM

## 2011-04-24 DIAGNOSIS — I10 Essential (primary) hypertension: Secondary | ICD-10-CM | POA: Insufficient documentation

## 2011-04-24 DIAGNOSIS — E785 Hyperlipidemia, unspecified: Secondary | ICD-10-CM | POA: Insufficient documentation

## 2011-04-24 HISTORY — PX: COLONOSCOPY: SHX5424

## 2011-04-24 SURGERY — COLONOSCOPY
Anesthesia: Moderate Sedation

## 2011-04-24 MED ORDER — MIDAZOLAM HCL 5 MG/5ML IJ SOLN
INTRAMUSCULAR | Status: DC | PRN
  Administered 2011-04-24: 2 mg via INTRAVENOUS

## 2011-04-24 MED ORDER — MEPERIDINE HCL 50 MG/ML IJ SOLN
INTRAMUSCULAR | Status: DC | PRN
  Administered 2011-04-24: 25 mg via INTRAVENOUS

## 2011-04-24 MED ORDER — STERILE WATER FOR IRRIGATION IR SOLN
Status: DC | PRN
  Administered 2011-04-24: 12:00:00

## 2011-04-24 MED ORDER — SODIUM CHLORIDE 0.9 % IJ SOLN
INTRAMUSCULAR | Status: AC
  Filled 2011-04-24: qty 10

## 2011-04-24 MED ORDER — MEPERIDINE HCL 50 MG/ML IJ SOLN
INTRAMUSCULAR | Status: AC
  Filled 2011-04-24: qty 1

## 2011-04-24 MED ORDER — MIDAZOLAM HCL 5 MG/5ML IJ SOLN
INTRAMUSCULAR | Status: AC
  Filled 2011-04-24: qty 10

## 2011-04-24 NOTE — H&P (Signed)
This is an update to history and physical from 04/13/2011. Has been no change in patient's condition or medications. He is here for screening colonoscopy.

## 2011-04-24 NOTE — H&P (Signed)
COLONOSCOPY PROCEDURE REPORT  PATIENT:  [name]  MR#:  [id number] Birthdate:  [birth year], 70 y.o., male Endoscopist:  Dr. Malissa Hippo, MD Referred By:  Dr. Lilyan Punt, MD Procedure Date: 04/24/2011  Procedure:   Colonoscopy  Indications: Patient is 70 year old Caucasian male who is here for screening colonoscopy. Family  history is positive for colon carcinoma and a sister.  Informed Consent: Procedure and risks were reviewed with the patient and informed consent was obtained. Medications:  Demerol 25 mg IV Versed 2 mg IV  Description of procedure:  After a digital rectal exam was performed, that colonoscope was advanced from the anus through the rectum and colon to the area of the cecum, ileocecal valve and appendiceal orifice. The cecum was deeply intubated. These structures were well-seen and photographed for the record. From the level of the cecum and ileocecal valve, the scope was slowly and cautiously withdrawn. The mucosal surfaces were carefully surveyed utilizing scope tip to flexion to facilitate fold flattening as needed. The scope was pulled down into the rectum where a thorough exam including retroflexion was performed.  Findings:   Prep excellent. Small polyp ablated via cold biopsy from transverse colon. Two small polyps ablated via cold biopsy from splenic flexure and submitted in one container. Few small diverticula at sigmoid colon.  Therapeutic/Diagnostic Maneuvers Performed:  See above  Complications:  None  Cecal Withdrawal Time:  15 minutes  Impression:  Examination performed to cecum. Three small polyps ablated via cold biopsy and two of these were submitted in one container. Few small diverticula at sigmoid colon.  Recommendations:  Standard instructions given. I will be contacting patient with results of biopsy and further recommendations.  Tadao Emig U  04/24/2011 12:50 PM  CC: Dr. Lilyan Punt, MD, MD & Dr. Bonnetta Barry ref. provider  found

## 2011-05-01 ENCOUNTER — Ambulatory Visit (INDEPENDENT_AMBULATORY_CARE_PROVIDER_SITE_OTHER): Payer: Medicare Other | Admitting: Urology

## 2011-05-01 DIAGNOSIS — R351 Nocturia: Secondary | ICD-10-CM

## 2011-05-01 DIAGNOSIS — R339 Retention of urine, unspecified: Secondary | ICD-10-CM

## 2011-05-01 DIAGNOSIS — R3915 Urgency of urination: Secondary | ICD-10-CM

## 2011-05-01 DIAGNOSIS — R350 Frequency of micturition: Secondary | ICD-10-CM

## 2011-05-06 ENCOUNTER — Encounter (HOSPITAL_COMMUNITY): Payer: Self-pay | Admitting: Internal Medicine

## 2011-05-20 ENCOUNTER — Encounter (INDEPENDENT_AMBULATORY_CARE_PROVIDER_SITE_OTHER): Payer: Self-pay | Admitting: *Deleted

## 2011-06-03 DIAGNOSIS — I4891 Unspecified atrial fibrillation: Secondary | ICD-10-CM | POA: Diagnosis not present

## 2011-06-03 DIAGNOSIS — I10 Essential (primary) hypertension: Secondary | ICD-10-CM | POA: Diagnosis not present

## 2011-06-05 ENCOUNTER — Ambulatory Visit (INDEPENDENT_AMBULATORY_CARE_PROVIDER_SITE_OTHER): Payer: Medicare Other | Admitting: Urology

## 2011-06-05 DIAGNOSIS — R351 Nocturia: Secondary | ICD-10-CM

## 2011-06-05 DIAGNOSIS — R339 Retention of urine, unspecified: Secondary | ICD-10-CM

## 2011-06-05 DIAGNOSIS — N401 Enlarged prostate with lower urinary tract symptoms: Secondary | ICD-10-CM | POA: Diagnosis not present

## 2011-06-05 DIAGNOSIS — R3915 Urgency of urination: Secondary | ICD-10-CM | POA: Diagnosis not present

## 2011-06-15 DIAGNOSIS — K219 Gastro-esophageal reflux disease without esophagitis: Secondary | ICD-10-CM | POA: Diagnosis not present

## 2011-06-15 DIAGNOSIS — H539 Unspecified visual disturbance: Secondary | ICD-10-CM | POA: Diagnosis not present

## 2011-06-17 DIAGNOSIS — H25019 Cortical age-related cataract, unspecified eye: Secondary | ICD-10-CM | POA: Diagnosis not present

## 2011-06-17 DIAGNOSIS — H43399 Other vitreous opacities, unspecified eye: Secondary | ICD-10-CM | POA: Diagnosis not present

## 2011-06-17 DIAGNOSIS — H40019 Open angle with borderline findings, low risk, unspecified eye: Secondary | ICD-10-CM | POA: Diagnosis not present

## 2011-06-22 DIAGNOSIS — H25019 Cortical age-related cataract, unspecified eye: Secondary | ICD-10-CM | POA: Diagnosis not present

## 2011-06-22 DIAGNOSIS — H40019 Open angle with borderline findings, low risk, unspecified eye: Secondary | ICD-10-CM | POA: Diagnosis not present

## 2011-06-25 DIAGNOSIS — J322 Chronic ethmoidal sinusitis: Secondary | ICD-10-CM | POA: Diagnosis not present

## 2011-07-17 DIAGNOSIS — IMO0002 Reserved for concepts with insufficient information to code with codable children: Secondary | ICD-10-CM | POA: Diagnosis not present

## 2011-07-17 DIAGNOSIS — H2510 Age-related nuclear cataract, unspecified eye: Secondary | ICD-10-CM | POA: Diagnosis not present

## 2011-08-06 DIAGNOSIS — M5126 Other intervertebral disc displacement, lumbar region: Secondary | ICD-10-CM | POA: Diagnosis not present

## 2011-08-06 DIAGNOSIS — IMO0002 Reserved for concepts with insufficient information to code with codable children: Secondary | ICD-10-CM | POA: Diagnosis not present

## 2011-08-11 DIAGNOSIS — I10 Essential (primary) hypertension: Secondary | ICD-10-CM | POA: Diagnosis not present

## 2011-08-11 DIAGNOSIS — I4891 Unspecified atrial fibrillation: Secondary | ICD-10-CM | POA: Diagnosis not present

## 2011-08-24 DIAGNOSIS — G8929 Other chronic pain: Secondary | ICD-10-CM | POA: Diagnosis not present

## 2011-08-24 DIAGNOSIS — M129 Arthropathy, unspecified: Secondary | ICD-10-CM | POA: Diagnosis not present

## 2011-09-03 ENCOUNTER — Other Ambulatory Visit: Payer: Self-pay | Admitting: *Deleted

## 2011-09-03 MED ORDER — DABIGATRAN ETEXILATE MESYLATE 150 MG PO CAPS: 150.0000 mg | ORAL_CAPSULE | Freq: Two times a day (BID) | ORAL | Status: DC

## 2011-10-26 ENCOUNTER — Telehealth: Payer: Self-pay | Admitting: Internal Medicine

## 2011-10-26 NOTE — Telephone Encounter (Signed)
New Problem:    Patient called in wanting to know if we have any samples of dabigatran (PRADAXA) 150 MG CAPS available for him to have.  Please call back, patient understands that you are out of the office when this message was taken.

## 2011-10-27 NOTE — Telephone Encounter (Signed)
Samples out front.  Patient aware 

## 2011-11-03 ENCOUNTER — Other Ambulatory Visit: Payer: Self-pay | Admitting: Neurological Surgery

## 2011-11-03 DIAGNOSIS — R531 Weakness: Secondary | ICD-10-CM

## 2011-11-11 DIAGNOSIS — H179 Unspecified corneal scar and opacity: Secondary | ICD-10-CM | POA: Diagnosis not present

## 2011-11-11 DIAGNOSIS — H43399 Other vitreous opacities, unspecified eye: Secondary | ICD-10-CM | POA: Diagnosis not present

## 2011-12-03 DIAGNOSIS — H179 Unspecified corneal scar and opacity: Secondary | ICD-10-CM | POA: Diagnosis not present

## 2011-12-03 DIAGNOSIS — H4011X0 Primary open-angle glaucoma, stage unspecified: Secondary | ICD-10-CM | POA: Diagnosis not present

## 2011-12-03 DIAGNOSIS — H43399 Other vitreous opacities, unspecified eye: Secondary | ICD-10-CM | POA: Diagnosis not present

## 2011-12-15 ENCOUNTER — Other Ambulatory Visit (HOSPITAL_COMMUNITY): Payer: Self-pay

## 2011-12-15 ENCOUNTER — Other Ambulatory Visit: Payer: Self-pay

## 2011-12-15 ENCOUNTER — Other Ambulatory Visit: Payer: Self-pay | Admitting: *Deleted

## 2011-12-15 ENCOUNTER — Telehealth (HOSPITAL_COMMUNITY): Payer: Self-pay

## 2011-12-15 DIAGNOSIS — I4891 Unspecified atrial fibrillation: Secondary | ICD-10-CM

## 2011-12-15 MED ORDER — DABIGATRAN ETEXILATE MESYLATE 150 MG PO CAPS: 150.0000 mg | ORAL_CAPSULE | Freq: Two times a day (BID) | ORAL | Status: DC

## 2011-12-15 NOTE — Telephone Encounter (Signed)
..   Requested Prescriptions   Signed Prescriptions Disp Refills  . dabigatran (PRADAXA) 150 MG CAPS 60 capsule 2    Sig: Take 1 capsule (150 mg total) by mouth every 12 (twelve) hours.    Authorizing Provider: Hillis Range    Ordering User: Lacie Scotts   Pt is aware of appointment.

## 2011-12-15 NOTE — Telephone Encounter (Signed)
called [facility] to let them know not to refill Pradaxa 150 mg rx.

## 2011-12-15 NOTE — Telephone Encounter (Signed)
..   Requested Prescriptions   Signed Prescriptions Disp Refills  . dabigatran (PRADAXA) 150 MG CAPS 60 capsule 2    Sig: Take 1 capsule (150 mg total) by mouth every 12 (twelve) hours.    Authorizing Provider: Hillis Range    Ordering User: Christella Hartigan, Caelan Atchley Judie Petit

## 2011-12-15 NOTE — Telephone Encounter (Signed)
Called patient about making appointment

## 2011-12-15 NOTE — Telephone Encounter (Signed)
..   Requested Prescriptions   Signed Prescriptions Disp Refills  . dabigatran (PRADAXA) 150 MG CAPS 60 capsule 2    Sig: Take 1 capsule (150 mg total) by mouth every 12 (twelve) hours.    Authorizing Provider: Rollene Rotunda    Ordering User: Christella Hartigan, Aanya Haynes Loran Senters patient about making appointment

## 2011-12-15 NOTE — Telephone Encounter (Signed)
Called patient to pick up samples

## 2011-12-23 DIAGNOSIS — M129 Arthropathy, unspecified: Secondary | ICD-10-CM | POA: Diagnosis not present

## 2011-12-23 DIAGNOSIS — E785 Hyperlipidemia, unspecified: Secondary | ICD-10-CM | POA: Diagnosis not present

## 2011-12-23 DIAGNOSIS — IMO0002 Reserved for concepts with insufficient information to code with codable children: Secondary | ICD-10-CM | POA: Diagnosis not present

## 2011-12-23 DIAGNOSIS — I10 Essential (primary) hypertension: Secondary | ICD-10-CM | POA: Diagnosis not present

## 2011-12-24 ENCOUNTER — Encounter: Payer: Self-pay | Admitting: Internal Medicine

## 2011-12-24 DIAGNOSIS — M2550 Pain in unspecified joint: Secondary | ICD-10-CM | POA: Diagnosis not present

## 2011-12-24 DIAGNOSIS — R5383 Other fatigue: Secondary | ICD-10-CM | POA: Diagnosis not present

## 2011-12-24 DIAGNOSIS — Z79899 Other long term (current) drug therapy: Secondary | ICD-10-CM | POA: Diagnosis not present

## 2011-12-24 DIAGNOSIS — R5381 Other malaise: Secondary | ICD-10-CM | POA: Diagnosis not present

## 2011-12-24 DIAGNOSIS — E782 Mixed hyperlipidemia: Secondary | ICD-10-CM | POA: Diagnosis not present

## 2011-12-24 DIAGNOSIS — Z125 Encounter for screening for malignant neoplasm of prostate: Secondary | ICD-10-CM | POA: Diagnosis not present

## 2012-01-06 DIAGNOSIS — H2510 Age-related nuclear cataract, unspecified eye: Secondary | ICD-10-CM | POA: Diagnosis not present

## 2012-01-06 DIAGNOSIS — H4011X0 Primary open-angle glaucoma, stage unspecified: Secondary | ICD-10-CM | POA: Diagnosis not present

## 2012-01-21 ENCOUNTER — Ambulatory Visit (INDEPENDENT_AMBULATORY_CARE_PROVIDER_SITE_OTHER): Payer: Medicare Other | Admitting: Internal Medicine

## 2012-01-21 ENCOUNTER — Encounter: Payer: Self-pay | Admitting: Internal Medicine

## 2012-01-21 VITALS — BP 126/76 | HR 68 | Ht 66.0 in | Wt 198.0 lb

## 2012-01-21 DIAGNOSIS — I4891 Unspecified atrial fibrillation: Secondary | ICD-10-CM | POA: Diagnosis not present

## 2012-01-21 DIAGNOSIS — E785 Hyperlipidemia, unspecified: Secondary | ICD-10-CM | POA: Insufficient documentation

## 2012-01-21 DIAGNOSIS — I10 Essential (primary) hypertension: Secondary | ICD-10-CM

## 2012-01-21 NOTE — Assessment & Plan Note (Signed)
Stable No change required today  

## 2012-01-21 NOTE — Progress Notes (Signed)
[name]:GEXBMW,UXLKG, MD Primary Cardiologist:  Formerly Dr Caprice Kluver  The patient presents today for routine electrophysiology followup.  Since last being seen in our clinic, the patient reports doing very well.  He is unaware of any further atrial fibrillation. His primary concern is with arthritis and tingling in his hands.  He also reports diffuse arthralgias which improved off of pravastatin in the past.  He is now trying pravastatin every other day.  Today, he denies symptoms of palpitations, chest pain, shortness of breath, orthopnea, PND, lower extremity edema, dizziness, presyncope, syncope, or neurologic sequela.    Past Medical History  Diagnosis Date  . Paroxysmal atrial fibrillation     s/p afib ablation 01/24/10  . History of hyperglycemia   . Hyperlipidemia   . HTN (hypertension)   . Chronic back pain     multiple back surgeries  . GERD (gastroesophageal reflux disease)   . Anxiety   . DVT (deep venous thrombosis) 03/2007    and PTE   Past Surgical History  Procedure Date  . Cervical and lumbar surgery   . Knee surgery     left  . Pvi 01/24/10    afib ablation  . Colonoscopy 04/24/2011    Procedure: COLONOSCOPY;  Surgeon: Malissa Hippo, MD;  Location: [facility];  Service: Endoscopy;  Laterality: N/A;  1200    Current Outpatient Prescriptions  Medication Sig Dispense Refill  . dabigatran (PRADAXA) 150 MG CAPS Take 1 capsule (150 mg total) by mouth every 12 (twelve) hours.  60 capsule  2  . HYDROcodone-acetaminophen (NORCO) 10-325 MG per tablet Take 1 tablet by mouth every 6 (six) hours as needed. For pain       . indapamide (LOZOL) 1.25 MG tablet Take 1.25 mg by mouth daily.        . Oxycodone HCl 10 MG TABS Twice daily.      . pravastatin (PRAVACHOL) 20 MG tablet Take 40 mg by mouth at bedtime. Taking every other night      . ramipril (ALTACE) 10 MG capsule Take 10 mg by mouth 2 (two) times daily.          Allergies  Allergen Reactions  . Sulfonamide Derivatives  Other (See Comments)    Runs blood pressure up    History   Social History  . Marital Status: Married    Spouse Name: N/A    Number of Children: N/A  . Years of Education: N/A   Occupational History  . retired    Social History Main Topics  . Smoking status: Never Smoker   . Smokeless tobacco: Never Used   Comment: quit over 30 years ago  . Alcohol Use: Yes     has a h/o heavy ETOH but now rarely drinks  . Drug Use: No  . Sexually Active: Not on file   Other Topics Concern  . Not on file   Social History Narrative  . No narrative on file    Family History  Problem Relation Age of Onset  . Hypertension    . Heart disease    . Hypertension Mother   . Transient ischemic attack Mother   . Heart disease Father   . Diabetes Brother   . Healthy Daughter   . Colon cancer Sister    Physical Exam: Filed Vitals:   01/21/12 1036  BP: 126/76  Pulse: 68  Height: 5\' 6"  (1.676 m)  Weight: 198 lb (89.812 kg)    GEN- The patient is well  appearing, alert and oriented x 3 today.   Head- normocephalic, atraumatic Eyes-  Sclera clear, conjunctiva pink Ears- hearing intact Oropharynx- clear Neck- supple, no JVP Lymph- no cervical lymphadenopathy Lungs- Clear to ausculation bilaterally, normal work of breathing Heart- Regular rate and rhythm, no murmurs, rubs or gallops, PMI not laterally displaced GI- soft, NT, ND, + BS Extremities- no clubbing, cyanosis, or edema MS- mild sweling in the MCP/PCP joints of both hands with tenderness Skin- no rash or lesion Psych- euthymic mood, full affect Neuro- strength and sensation are intact  ekg today reveals sinus rhythm 68 bpm, otherwise normal ekg  Assessment and Plan:

## 2012-01-21 NOTE — Patient Instructions (Addendum)
Your physician wants you to follow-up in: 6 months with Lori Gerhardt, NP and 12 months with Dr Allred You will receive a reminder letter in the mail two months in advance. If you don't receive a letter, please call our office to schedule the follow-up appointment.  

## 2012-01-21 NOTE — Assessment & Plan Note (Signed)
Presently trying pravachol every other day He will follow up with Dr Gerda Diss for repeat lipids in 4-6 weeks An alternative would be zetia or even red yeast rice.  I will ask Dr Gerda Diss to forward labs to my office for review.   As Dr Clarene Duke has retired, the patient asks that I follow his afib going forward.  I am happy to do so. I will have him see Norma Fredrickson in 6 months and I will see him again in 1 year

## 2012-01-21 NOTE — Assessment & Plan Note (Signed)
Doing well s/p ablation without recurrence off of AAD Continue long term pradaxa given afib + h/o DVT/PTE

## 2012-02-01 DIAGNOSIS — G8929 Other chronic pain: Secondary | ICD-10-CM | POA: Diagnosis not present

## 2012-02-01 DIAGNOSIS — Z23 Encounter for immunization: Secondary | ICD-10-CM | POA: Diagnosis not present

## 2012-02-01 DIAGNOSIS — E785 Hyperlipidemia, unspecified: Secondary | ICD-10-CM | POA: Diagnosis not present

## 2012-02-01 DIAGNOSIS — G589 Mononeuropathy, unspecified: Secondary | ICD-10-CM | POA: Diagnosis not present

## 2012-02-04 DIAGNOSIS — H4011X0 Primary open-angle glaucoma, stage unspecified: Secondary | ICD-10-CM | POA: Diagnosis not present

## 2012-02-15 DIAGNOSIS — G5600 Carpal tunnel syndrome, unspecified upper limb: Secondary | ICD-10-CM | POA: Diagnosis not present

## 2012-02-22 ENCOUNTER — Telehealth: Payer: Self-pay | Admitting: Internal Medicine

## 2012-02-22 ENCOUNTER — Encounter (INDEPENDENT_AMBULATORY_CARE_PROVIDER_SITE_OTHER): Payer: Self-pay | Admitting: *Deleted

## 2012-02-22 ENCOUNTER — Ambulatory Visit (INDEPENDENT_AMBULATORY_CARE_PROVIDER_SITE_OTHER): Payer: Medicare Other | Admitting: Internal Medicine

## 2012-02-22 ENCOUNTER — Encounter (INDEPENDENT_AMBULATORY_CARE_PROVIDER_SITE_OTHER): Payer: Self-pay | Admitting: Internal Medicine

## 2012-02-22 ENCOUNTER — Other Ambulatory Visit (INDEPENDENT_AMBULATORY_CARE_PROVIDER_SITE_OTHER): Payer: Self-pay | Admitting: *Deleted

## 2012-02-22 VITALS — BP 126/58 | HR 72 | Temp 98.1°F | Ht 66.0 in | Wt 198.9 lb

## 2012-02-22 DIAGNOSIS — J329 Chronic sinusitis, unspecified: Secondary | ICD-10-CM | POA: Insufficient documentation

## 2012-02-22 DIAGNOSIS — K219 Gastro-esophageal reflux disease without esophagitis: Secondary | ICD-10-CM | POA: Diagnosis not present

## 2012-02-22 MED ORDER — AZITHROMYCIN 250 MG PO TABS: ORAL_TABLET | ORAL | Status: DC

## 2012-02-22 NOTE — Patient Instructions (Addendum)
EGD with Dr. Rehman. The risks and benefits such as perforation, bleeding, and infection were reviewed with the patient and is agreeable. 

## 2012-02-22 NOTE — Telephone Encounter (Signed)
Hold pradaxa for 48 hours prior to the procedure and restart 24 hours after the procedure

## 2012-02-22 NOTE — Progress Notes (Signed)
Subjective:     Patient ID: [name], male   DOB: [birth year], 71 y.o.   MRN: [id number]  HPI Referred to our office by Dr. Gerda Diss for bad acid reflux, belching for over a year. He was on Nexium which helped.  Presently on Pepcid and Mylanta. He has a bad taste in his mouth.  He allso says he has been hoarse.   Appetite is good. No weight loss. Epigastric burning/discomfort. No melena or bright red rectal bleeding.  C/o sinus drainage.  Denies fever. States he looses his voice with the sinus drainage.  Review of Systems   see hpi    Family Status  Relation Status Death Age  . Mother Deceased 52  . Father Deceased   . Brother Deceased 46  . Daughter Alive   . Sister Alive     multiple health problems  . Daughter Alive     Visual merchandiser in place   Past Surgical History  Procedure Date  . Cervical and lumbar surgery   . Knee surgery     left  . Pvi 01/24/10    afib ablation  . Colonoscopy 04/24/2011    Procedure: COLONOSCOPY;  Surgeon: Malissa Hippo, MD;  Location: [facility];  Service: Endoscopy;  Laterality: N/A;  1200   History   Social History  . Marital Status: Married    Spouse Name: N/A    Number of Children: N/A  . Years of Education: N/A   Occupational History  . retired    Social History Main Topics  . Smoking status: Never Smoker   . Smokeless tobacco: Never Used   Comment: quit over 30 years ago  . Alcohol Use: Yes     has a h/o heavy ETOH but now rarely drinks  . Drug Use: No  . Sexually Active: Not on file   Other Topics Concern  . Not on file   Social History Narrative  . No narrative on file   Allergies  Allergen Reactions  . Sulfonamide Derivatives Other (See Comments)    Runs blood pressure up    Objective:   Physical Exam   Filed Vitals:   02/22/12 1458  BP: 126/58  Pulse: 72  Temp: 98.1 F (36.7 C)  Height: 5\' 6"  (1.676 m)  Weight: 198 lb 14.4 oz (90.22 kg)   Alert and oriented. Skin warm and dry. Oral mucosa is moist.    . Sclera anicteric, conjunctivae is pink. Thyroid not enlarged. No cervical lymphadenopathy. Lungs clear. Heart regular rate and rhythm.  Abdomen is soft. Bowel sounds are positive. No hepatomegaly. No abdominal masses felt. No tenderness.  No edema to lower extremities.       Assessment:    GERD, uncontrolled at this time.  SSinusitis with drainage.     Plan:    EGD with DR. Rehman.    The risks and benefits such as perforation, bleeding, and infection were reviewed with the patient and is agreeable.  Will eprescribe an Rx for Zpac to [facility] in [place].

## 2012-02-22 NOTE — Telephone Encounter (Signed)
plz return call to [facility]- Dr. Karilyn Cota  [phone] regarding Pradaxa hold, surgery scheduled for 03/10/12

## 2012-02-29 ENCOUNTER — Encounter (INDEPENDENT_AMBULATORY_CARE_PROVIDER_SITE_OTHER): Payer: Self-pay

## 2012-03-03 DIAGNOSIS — H2510 Age-related nuclear cataract, unspecified eye: Secondary | ICD-10-CM | POA: Diagnosis not present

## 2012-03-03 DIAGNOSIS — H4011X0 Primary open-angle glaucoma, stage unspecified: Secondary | ICD-10-CM | POA: Diagnosis not present

## 2012-03-03 DIAGNOSIS — H43399 Other vitreous opacities, unspecified eye: Secondary | ICD-10-CM | POA: Diagnosis not present

## 2012-03-07 ENCOUNTER — Encounter (HOSPITAL_COMMUNITY): Payer: Self-pay

## 2012-03-09 MED ORDER — SODIUM CHLORIDE 0.45 % IV SOLN
INTRAVENOUS | Status: DC
  Administered 2012-03-10: 14:00:00 via INTRAVENOUS

## 2012-03-10 ENCOUNTER — Encounter (HOSPITAL_COMMUNITY)
Admission: RE | Disposition: A | Discharge status: Home Health | Payer: Self-pay | Source: Ambulatory Visit | Attending: Internal Medicine

## 2012-03-10 ENCOUNTER — Encounter (HOSPITAL_COMMUNITY): Payer: Self-pay | Admitting: *Deleted

## 2012-03-10 ENCOUNTER — Ambulatory Visit (HOSPITAL_COMMUNITY)
Admission: RE | Admit: 2012-03-10 | Discharge: 2012-03-10 | Disposition: A | Discharge status: Home Health | Payer: Medicare Other | Source: Ambulatory Visit | Attending: Internal Medicine | Admitting: Internal Medicine

## 2012-03-10 DIAGNOSIS — K449 Diaphragmatic hernia without obstruction or gangrene: Secondary | ICD-10-CM

## 2012-03-10 DIAGNOSIS — R6889 Other general symptoms and signs: Secondary | ICD-10-CM

## 2012-03-10 DIAGNOSIS — K219 Gastro-esophageal reflux disease without esophagitis: Secondary | ICD-10-CM | POA: Diagnosis not present

## 2012-03-10 DIAGNOSIS — I10 Essential (primary) hypertension: Secondary | ICD-10-CM | POA: Diagnosis not present

## 2012-03-10 DIAGNOSIS — R141 Gas pain: Secondary | ICD-10-CM | POA: Diagnosis not present

## 2012-03-10 DIAGNOSIS — K228 Other specified diseases of esophagus: Secondary | ICD-10-CM | POA: Diagnosis not present

## 2012-03-10 DIAGNOSIS — K208 Other esophagitis: Secondary | ICD-10-CM

## 2012-03-10 DIAGNOSIS — R143 Flatulence: Secondary | ICD-10-CM

## 2012-03-10 DIAGNOSIS — R12 Heartburn: Secondary | ICD-10-CM

## 2012-03-10 DIAGNOSIS — R142 Eructation: Secondary | ICD-10-CM

## 2012-03-10 HISTORY — PX: ESOPHAGOGASTRODUODENOSCOPY: SHX5428

## 2012-03-10 SURGERY — EGD (ESOPHAGOGASTRODUODENOSCOPY)
Anesthesia: Moderate Sedation

## 2012-03-10 MED ORDER — STERILE WATER FOR IRRIGATION IR SOLN
Status: DC | PRN
  Administered 2012-03-10: 14:00:00

## 2012-03-10 MED ORDER — MEPERIDINE HCL 50 MG/ML IJ SOLN
INTRAMUSCULAR | Status: AC
  Filled 2012-03-10: qty 1

## 2012-03-10 MED ORDER — MIDAZOLAM HCL 5 MG/5ML IJ SOLN
INTRAMUSCULAR | Status: DC | PRN
  Administered 2012-03-10 (×3): 2 mg via INTRAVENOUS

## 2012-03-10 MED ORDER — PANTOPRAZOLE SODIUM 40 MG PO TBEC: 40.0000 mg | DELAYED_RELEASE_TABLET | Freq: Two times a day (BID) | ORAL | Status: DC

## 2012-03-10 MED ORDER — MEPERIDINE HCL 25 MG/ML IJ SOLN
INTRAMUSCULAR | Status: DC | PRN
  Administered 2012-03-10 (×2): 25 mg via INTRAVENOUS

## 2012-03-10 MED ORDER — BUTAMBEN-TETRACAINE-BENZOCAINE 2-2-14 % EX AERO
INHALATION_SPRAY | CUTANEOUS | Status: DC | PRN
  Administered 2012-03-10: 2 via TOPICAL

## 2012-03-10 MED ORDER — MIDAZOLAM HCL 5 MG/5ML IJ SOLN
INTRAMUSCULAR | Status: AC
  Filled 2012-03-10: qty 10

## 2012-03-10 NOTE — H&P (Addendum)
[name] is an 71 y.o. male.   Chief Complaint: Patient is here for diagnostic EGD. HPI: Patient is 72 year old Caucasian male who presents with recurrent belching and what he patches his odor to it. He also gives history of nocturnal regurgitation which has improved since he's been eating early. He is having intermittent heartburn despite taking medications. He denies nausea vomiting melena or rectal bleeding. He wants to swallow saliva all the time. He states he eats too much and he also eats the foods that he should not be eating. He also gives history of post nasal discharge and was recently treated for sinusitis and feels better.  Past Medical History  Diagnosis Date  . Paroxysmal atrial fibrillation     s/p afib ablation 01/24/10  . History of hyperglycemia   . Hyperlipidemia   . HTN (hypertension)   . Chronic back pain     multiple back surgeries  . GERD (gastroesophageal reflux disease)   . Anxiety   . DVT (deep venous thrombosis) 03/2007    and PTE    Past Surgical History  Procedure Date  . Cervical and lumbar surgery   . Knee surgery     left  . Pvi 01/24/10    afib ablation  . Colonoscopy 04/24/2011    Procedure: COLONOSCOPY;  Surgeon: Malissa Hippo, MD;  Location: [facility];  Service: Endoscopy;  Laterality: N/A;  1200    Family History  Problem Relation Age of Onset  . Hypertension    . Heart disease    . Hypertension Mother   . Transient ischemic attack Mother   . Heart disease Father   . Diabetes Brother   . Healthy Daughter   . Colon cancer Sister    Social History:  reports that he has never smoked. He has never used smokeless tobacco. He reports that he drinks alcohol. He reports that he does not use illicit drugs.  Allergies:  Allergies  Allergen Reactions  . Sulfonamide Derivatives Other (See Comments)    Runs blood pressure up    Medications Prior to Admission  Medication Sig Dispense Refill  . alum & mag hydroxide-simeth  (MAALOX/MYLANTA) 200-200-20 MG/5ML suspension Take 30 mLs by mouth every 6 (six) hours as needed. For indigestion      . cimetidine (TAGAMET) 200 MG tablet Take 200 mg by mouth 2 (two) times daily as needed. For indigestion      . esomeprazole (NEXIUM) 40 MG capsule Take 40 mg by mouth daily before breakfast.      . famotidine (PEPCID AC) 10 MG chewable tablet Chew 10 mg by mouth 2 (two) times daily as needed. For indigestion      . indapamide (LOZOL) 1.25 MG tablet Take 1.25 mg by mouth daily.        . Oxycodone HCl 10 MG TABS Take 10 mg by mouth every 6 (six) hours as needed. For pain      . pravastatin (PRAVACHOL) 40 MG tablet Take 40 mg by mouth every Monday, Wednesday, and Friday.      . ramipril (ALTACE) 10 MG capsule Take 10 mg by mouth 2 (two) times daily.        . dabigatran (PRADAXA) 150 MG CAPS Take 1 capsule (150 mg total) by mouth every 12 (twelve) hours.  60 capsule  2    No results found for this or any previous visit (from the past 48 hour(s)). No results found.  ROS  Blood pressure 184/98, pulse 66, temperature  98 F (36.7 C), temperature source Oral, resp. rate 18, height 5\' 6"  (1.676 m), weight 198 lb (89.812 kg), SpO2 97.00%. Physical Exam  Constitutional: He appears well-developed and well-nourished.  HENT:  Mouth/Throat: Oropharynx is clear and moist.  Eyes: Conjunctivae normal are normal. No scleral icterus.  Neck: No thyromegaly present.  Cardiovascular: Normal rate, regular rhythm and normal heart sounds.   No murmur heard. Respiratory: Effort normal and breath sounds normal.  GI: Soft. He exhibits no distension and no mass. There is no tenderness.  Musculoskeletal: He exhibits no edema.  Lymphadenopathy:    He has no cervical adenopathy.  Neurological: He is alert.  Skin: Skin is warm and dry.     Assessment/Plan Typical and atypical symptoms of GERD not responding to therapy. Diagnostic EGD.  REHMAN,NAJEEB U 03/10/2012, 2:24 PM

## 2012-03-10 NOTE — Progress Notes (Signed)
Called Dr. Karilyn Cota at [phone]-Asked if patient could start Pradaxa back. Dr. Karilyn Cota stated patient could start Pradaxa back.

## 2012-03-10 NOTE — Op Note (Signed)
EGD PROCEDURE REPORT  PATIENT:  [name]  MR#:  [id number] Birthdate:  [birth year], 71 y.o., male Endoscopist:  Dr. Malissa Hippo, MD Referred By:  Dr. Lilyan Punt, MD Procedure Date: 03/10/2012  Procedure:   EGD  Indications:  Patient is 71 year old Caucasian male who presents with pyrosis belching with odor, intermittent heartburn frequent need to swallow, hoarseness regurgitation. He is felt to have GERD but not responding to therapy.            Informed Consent:  The risks, benefits, alternatives & imponderables which include, but are not limited to, bleeding, infection, perforation, drug reaction and potential missed lesion have been reviewed.  The potential for biopsy, lesion removal, esophageal dilation, etc. have also been discussed.  Questions have been answered.  All parties agreeable.  Please see history & physical in medical record for more information.  Medications:  Demerol 50 mg IV Versed 6 mg IV Cetacaine spray topically for oropharyngeal anesthesia  Description of procedure:  The endoscope was introduced through the mouth and advanced to the second portion of the duodenum without difficulty or limitations. The mucosal surfaces were surveyed very carefully during advancement of the scope and upon withdrawal.  Findings:  Esophagus:  Mucosa of the proximal and middle third was normal. There was focal erythema to mucosa at GE junction which was serrated. Biopsy taken to rule out short segment Barretts. GEJ:  38 cm Hiatus:  40 cm Stomach:  Stomach was empty and distended very well with insufflation. Folds in the proximal stomach were normal. Examination of mucosa at body and antrum was normal. There was  erythema and edema to mucosa at pyloric channel  But it was patent. Angularis, fundus and cardia were examined by retroflexing the scope and were normal. Duodenum:  Normal bulbar and post bulbar mucosa.  Therapeutic/Diagnostic Maneuvers Performed:  Biopsy taken from GE  junction as above.  Complications:  None  Impression: Mild changes of reflux esophagitis limited to GE junction which is serrated. Biopsy taken to rule out short segment Barrett's esophagus. Small sliding hiatal hernia. Pyloric  channel inflammation without stricture or ulcer.  Recommendations:  Anti-reflux measures reinforced. Discontinue Nexium and Pepcid. Pantoprazole 40 mg by mouth twice a day. H. Pylori serology. I would be contacting patient with results of biopsy and blood test and further recommendations.  REHMAN,NAJEEB U  03/10/2012  2:54 PM  CC: Dr. Lilyan Punt, MD & Dr. Bonnetta Barry ref. provider found

## 2012-03-11 LAB — H. PYLORI ANTIBODY, IGG: H Pylori IgG: <0.4 {ISR}

## 2012-03-14 DIAGNOSIS — J019 Acute sinusitis, unspecified: Secondary | ICD-10-CM | POA: Diagnosis not present

## 2012-03-15 ENCOUNTER — Emergency Department (HOSPITAL_COMMUNITY): Payer: Medicare Other

## 2012-03-15 ENCOUNTER — Encounter (HOSPITAL_COMMUNITY): Payer: Self-pay | Admitting: *Deleted

## 2012-03-15 ENCOUNTER — Emergency Department (HOSPITAL_COMMUNITY)
Admission: EM | Admit: 2012-03-15 | Discharge: 2012-03-15 | Disposition: A | Discharge status: Home / Self Care | Payer: Medicare Other | Attending: Emergency Medicine | Admitting: Emergency Medicine

## 2012-03-15 DIAGNOSIS — R7309 Other abnormal glucose: Secondary | ICD-10-CM | POA: Diagnosis not present

## 2012-03-15 DIAGNOSIS — Z79899 Other long term (current) drug therapy: Secondary | ICD-10-CM | POA: Insufficient documentation

## 2012-03-15 DIAGNOSIS — M549 Dorsalgia, unspecified: Secondary | ICD-10-CM | POA: Insufficient documentation

## 2012-03-15 DIAGNOSIS — G8929 Other chronic pain: Secondary | ICD-10-CM | POA: Diagnosis not present

## 2012-03-15 DIAGNOSIS — I82409 Acute embolism and thrombosis of unspecified deep veins of unspecified lower extremity: Secondary | ICD-10-CM | POA: Diagnosis not present

## 2012-03-15 DIAGNOSIS — I10 Essential (primary) hypertension: Secondary | ICD-10-CM | POA: Insufficient documentation

## 2012-03-15 DIAGNOSIS — E785 Hyperlipidemia, unspecified: Secondary | ICD-10-CM | POA: Insufficient documentation

## 2012-03-15 DIAGNOSIS — M25559 Pain in unspecified hip: Secondary | ICD-10-CM | POA: Diagnosis not present

## 2012-03-15 DIAGNOSIS — Z8659 Personal history of other mental and behavioral disorders: Secondary | ICD-10-CM | POA: Insufficient documentation

## 2012-03-15 DIAGNOSIS — I4891 Unspecified atrial fibrillation: Secondary | ICD-10-CM | POA: Diagnosis not present

## 2012-03-15 DIAGNOSIS — K219 Gastro-esophageal reflux disease without esophagitis: Secondary | ICD-10-CM | POA: Insufficient documentation

## 2012-03-15 MED ORDER — DIAZEPAM 5 MG PO TABS
5.0000 mg | ORAL_TABLET | Freq: Once | ORAL | Status: AC
  Administered 2012-03-15: 5 mg via ORAL
  Filled 2012-03-15: qty 1

## 2012-03-15 MED ORDER — HYDROMORPHONE HCL PF 1 MG/ML IJ SOLN
1.0000 mg | Freq: Once | INTRAMUSCULAR | Status: AC
  Administered 2012-03-15: 1 mg via INTRAVENOUS

## 2012-03-15 MED ORDER — ONDANSETRON HCL 4 MG/2ML IJ SOLN
4.0000 mg | Freq: Once | INTRAMUSCULAR | Status: AC
  Administered 2012-03-15: 4 mg via INTRAVENOUS
  Filled 2012-03-15: qty 2

## 2012-03-15 MED ORDER — HYDROMORPHONE HCL PF 1 MG/ML IJ SOLN
1.0000 mg | Freq: Once | INTRAMUSCULAR | Status: AC
  Administered 2012-03-15: 1 mg via INTRAVENOUS
  Filled 2012-03-15: qty 1

## 2012-03-15 MED ORDER — ACETAMINOPHEN 500 MG PO TABS
1000.0000 mg | ORAL_TABLET | Freq: Once | ORAL | Status: AC
  Administered 2012-03-15: 1000 mg via ORAL
  Filled 2012-03-15: qty 2

## 2012-03-15 MED ORDER — HYDROMORPHONE HCL PF 1 MG/ML IJ SOLN
INTRAMUSCULAR | Status: AC
  Administered 2012-03-15: 1 mg via INTRAVENOUS
  Filled 2012-03-15: qty 1

## 2012-03-15 MED ORDER — SODIUM CHLORIDE 0.9 % IV SOLN
Freq: Once | INTRAVENOUS | Status: AC
  Administered 2012-03-15: 01:00:00 via INTRAVENOUS

## 2012-03-15 MED ORDER — HYDROMORPHONE HCL PF 1 MG/ML IJ SOLN
0.5000 mg | Freq: Once | INTRAMUSCULAR | Status: AC
  Administered 2012-03-15: 0.5 mg via INTRAVENOUS
  Filled 2012-03-15: qty 1

## 2012-03-15 NOTE — ED Notes (Signed)
Pt c/o left hip pain. Pt states he has been seen by his md and treated for this and has had no relief.

## 2012-03-15 NOTE — ED Provider Notes (Signed)
History     CSN: [id number]  Arrival date & time 03/15/12  0048   First MD Initiated Contact with Patient 03/15/12 0050      Chief Complaint  Patient presents with  . Hip Pain    (Consider location/radiation/quality/duration/timing/severity/associated sxs/prior treatment) HPI   [name] is a 71 y.o. male with a h/o back surgery with hardware in place, who presents to the Emergency Department complaining of sudden onset severe left hip pain at 2230 that began while getting ready for bed. No known injury. Sudden grabbing pain that would not relieve with walking or rubbing. He had taken an percocet without relief. He is under treatment for a sinus infection with antibiotics. He received a levaquin injection today in the other hip.  PCP Luking    Past Medical History  Diagnosis Date  . Paroxysmal atrial fibrillation     s/p afib ablation 01/24/10  . History of hyperglycemia   . Hyperlipidemia   . HTN (hypertension)   . Chronic back pain     multiple back surgeries  . GERD (gastroesophageal reflux disease)   . Anxiety   . DVT (deep venous thrombosis) 03/2007    and PTE    Past Surgical History  Procedure Date  . Cervical and lumbar surgery   . Knee surgery     left  . Pvi 01/24/10    afib ablation  . Colonoscopy 04/24/2011    Procedure: COLONOSCOPY;  Surgeon: Malissa Hippo, MD;  Location: [facility];  Service: Endoscopy;  Laterality: N/A;  1200    Family History  Problem Relation Age of Onset  . Hypertension    . Heart disease    . Hypertension Mother   . Transient ischemic attack Mother   . Heart disease Father   . Diabetes Brother   . Healthy Daughter   . Colon cancer Sister     History  Substance Use Topics  . Smoking status: Never Smoker   . Smokeless tobacco: Never Used  . Alcohol Use: Yes     has a h/o heavy ETOH but now rarely drinks      Review of Systems  Constitutional: Negative for fever.       10 Systems reviewed and are negative  for acute change except as noted in the HPI.  HENT: Negative for congestion.   Eyes: Negative for discharge and redness.  Respiratory: Negative for cough and shortness of breath.   Cardiovascular: Negative for chest pain.  Gastrointestinal: Negative for vomiting and abdominal pain.  Musculoskeletal: Negative for back pain.       Left hip pain  Skin: Negative for rash.  Neurological: Negative for syncope, numbness and headaches.  Psychiatric/Behavioral:       No behavior change.    Allergies  Sulfonamide derivatives  Home Medications   Current Outpatient Rx  Name Route Sig Dispense Refill  . ALUM & MAG HYDROXIDE-SIMETH 200-200-20 MG/5ML PO SUSP Oral Take 30 mLs by mouth every 6 (six) hours as needed. For indigestion    . CIMETIDINE 200 MG PO TABS Oral Take 200 mg by mouth 2 (two) times daily as needed. For indigestion    . DABIGATRAN ETEXILATE MESYLATE 150 MG PO CAPS Oral Take 1 capsule (150 mg total) by mouth every 12 (twelve) hours. 60 capsule 2    Patient has appointment Sept 5 @ 1030  . INDAPAMIDE 1.25 MG PO TABS Oral Take 1.25 mg by mouth daily.      . OXYCODONE  HCL 10 MG PO TABS Oral Take 10 mg by mouth every 6 (six) hours as needed. For pain    . PANTOPRAZOLE SODIUM 40 MG PO TBEC Oral Take 1 tablet (40 mg total) by mouth 2 (two) times daily before a meal. 60 tablet 5  . PRAVASTATIN SODIUM 40 MG PO TABS Oral Take 40 mg by mouth every Monday, Wednesday, and Friday.    Marland Kitchen RAMIPRIL 10 MG PO CAPS Oral Take 10 mg by mouth 2 (two) times daily.        BP 204/94  Pulse 74  Temp 98 F (36.7 C) (Oral)  Resp 22  Ht 5\' 6"  (1.676 m)  Wt 197 lb (89.359 kg)  BMI 31.80 kg/m2  SpO2 100%  Physical Exam  Nursing note and vitals reviewed. Constitutional: He is oriented to person, place, and time. He appears well-developed and well-nourished.       Awake, alert, nontoxic appearance.  HENT:  Head: Atraumatic.  Eyes: Right eye exhibits no discharge. Left eye exhibits no discharge.    Neck: Neck supple.  Cardiovascular: Normal heart sounds.   Pulmonary/Chest: Effort normal and breath sounds normal. He exhibits no tenderness.  Abdominal: Soft. There is no tenderness. There is no rebound.  Musculoskeletal: He exhibits no tenderness.       Baseline ROM, no obvious new focal weakness. Left hip with FROM, pain is located at the iliac crest just superior to the hip.   Neurological: He is alert and oriented to person, place, and time.       Mental status and motor strength appears baseline for patient and situation.  Skin: No rash noted.  Psychiatric: He has a normal mood and affect.    ED Course  Procedures (including critical care time)  Dg Hip Complete Left  03/15/2012  *RADIOLOGY REPORT*  Clinical Data: Left hip pain.  No known injury.  LEFT HIP - COMPLETE 2+ VIEW  Comparison: Plain films 11/26/2009.  Findings: Both hips are located with only mild degenerative change present.  Symphysis pubis and sacroiliac joints are unremarkable. Postoperative change lower lumbar spine is noted.  IMPRESSION: No acute finding.  Mild bilateral hip degenerative change.  Stable compared to prior exam.   Original Report Authenticated By: Bernadene Bell. D'ALESSIO, M.D.     [phone] Pain has eased some. Still grabbing. Ordered additional analgesic. 0315 Patient feeling better. Able to sit up without pain. He remembers that he was moving a refrigerator on Saturday and felt a twinge in his back.States his pain was so severe earlier he forgot about Saturday. 9604 Patient ambulated without difficulty. He remains tentative. He has received additional analgesic and valium. He feels able to go home and follow up with his doctor..  MDM  Patient with h/o back surgery here with sudden onset pain to left iliac crest/hip. Xrays without acute findings. Given several doses of analgesics before affecting pain. Pain improved and patient able to ambulate. He will follow up with his doctor. Pt feels improved after  observation and/or treatment in ED.Pt stable in ED with no significant deterioration in condition.The patient appears reasonably screened and/or stabilized for discharge and I doubt any other medical condition or other [facility] requiring further screening, evaluation, or treatment in the ED at this time prior to discharge.  MDM Reviewed: nursing note and vitals Interpretation: x-ray           Nicoletta Dress. Colon Branch, MD 03/15/12 [phone]

## 2012-03-16 ENCOUNTER — Encounter (HOSPITAL_COMMUNITY): Payer: Self-pay | Admitting: Internal Medicine

## 2012-03-21 ENCOUNTER — Telehealth (INDEPENDENT_AMBULATORY_CARE_PROVIDER_SITE_OTHER): Payer: Self-pay | Admitting: *Deleted

## 2012-03-21 ENCOUNTER — Encounter (INDEPENDENT_AMBULATORY_CARE_PROVIDER_SITE_OTHER): Payer: Self-pay | Admitting: *Deleted

## 2012-03-21 DIAGNOSIS — IMO0002 Reserved for concepts with insufficient information to code with codable children: Secondary | ICD-10-CM | POA: Diagnosis not present

## 2012-03-21 NOTE — Telephone Encounter (Signed)
Still having problems with losing voice, seen his PCP last week and was given antibiotic for sinus infection, patient states taking TUMS or Rolaids or something cold sooths his throat and voice, states meds you prescribed at time of his EGD is helping his acid reflux, patient states he had been drinking limeade for the last year but has stopped...could this be the culprit...please advise as to next step.. Patient cell# [phone]

## 2012-03-22 ENCOUNTER — Other Ambulatory Visit (INDEPENDENT_AMBULATORY_CARE_PROVIDER_SITE_OTHER): Payer: Self-pay | Admitting: Internal Medicine

## 2012-03-22 ENCOUNTER — Other Ambulatory Visit: Payer: Self-pay | Admitting: Neurological Surgery

## 2012-03-22 ENCOUNTER — Other Ambulatory Visit (HOSPITAL_COMMUNITY): Payer: Self-pay | Admitting: Neurological Surgery

## 2012-03-22 DIAGNOSIS — R12 Heartburn: Secondary | ICD-10-CM

## 2012-03-22 DIAGNOSIS — IMO0002 Reserved for concepts with insufficient information to code with codable children: Secondary | ICD-10-CM

## 2012-03-22 DIAGNOSIS — R142 Eructation: Secondary | ICD-10-CM

## 2012-03-22 NOTE — Telephone Encounter (Signed)
Per Dr Karilyn Cota patient need GES, this is sch'd 03/29/12 @ 800 (745), npo after midnight, patient aware

## 2012-03-23 ENCOUNTER — Encounter (HOSPITAL_COMMUNITY): Payer: Self-pay | Admitting: Pharmacy Technician

## 2012-03-29 ENCOUNTER — Encounter (HOSPITAL_COMMUNITY)
Admission: RE | Admit: 2012-03-29 | Discharge: 2012-03-29 | Disposition: A | Discharge status: Home / Self Care | Payer: Medicare Other | Source: Ambulatory Visit | Attending: Internal Medicine | Admitting: Internal Medicine

## 2012-03-29 ENCOUNTER — Encounter (HOSPITAL_COMMUNITY): Payer: Self-pay

## 2012-03-29 DIAGNOSIS — R109 Unspecified abdominal pain: Secondary | ICD-10-CM | POA: Diagnosis not present

## 2012-03-29 DIAGNOSIS — R12 Heartburn: Secondary | ICD-10-CM | POA: Insufficient documentation

## 2012-03-29 DIAGNOSIS — R142 Eructation: Secondary | ICD-10-CM | POA: Insufficient documentation

## 2012-03-29 DIAGNOSIS — R141 Gas pain: Secondary | ICD-10-CM | POA: Diagnosis not present

## 2012-03-29 HISTORY — DX: Malignant (primary) neoplasm, unspecified: C80.1

## 2012-03-29 MED ORDER — TECHNETIUM TC 99M SULFUR COLLOID
2.0000 | Freq: Once | INTRAVENOUS | Status: AC | PRN
  Administered 2012-03-29: 2.1 via ORAL

## 2012-03-31 ENCOUNTER — Ambulatory Visit (HOSPITAL_COMMUNITY)
Admission: RE | Admit: 2012-03-31 | Discharge: 2012-03-31 | Disposition: A | Discharge status: Home / Self Care | Payer: Medicare Other | Source: Ambulatory Visit | Attending: Neurological Surgery | Admitting: Neurological Surgery

## 2012-03-31 DIAGNOSIS — M47817 Spondylosis without myelopathy or radiculopathy, lumbosacral region: Secondary | ICD-10-CM | POA: Diagnosis not present

## 2012-03-31 DIAGNOSIS — M5126 Other intervertebral disc displacement, lumbar region: Secondary | ICD-10-CM | POA: Diagnosis not present

## 2012-03-31 DIAGNOSIS — IMO0002 Reserved for concepts with insufficient information to code with codable children: Secondary | ICD-10-CM

## 2012-03-31 DIAGNOSIS — Z981 Arthrodesis status: Secondary | ICD-10-CM | POA: Insufficient documentation

## 2012-03-31 MED ORDER — DIAZEPAM 5 MG PO TABS
ORAL_TABLET | ORAL | Status: AC
  Administered 2012-03-31: 10 mg via ORAL
  Filled 2012-03-31: qty 2

## 2012-03-31 MED ORDER — ONDANSETRON HCL 4 MG/2ML IJ SOLN: 4.0000 mg | Freq: Four times a day (QID) | INTRAMUSCULAR | Status: DC | PRN

## 2012-03-31 MED ORDER — DIAZEPAM 5 MG PO TABS
10.0000 mg | ORAL_TABLET | Freq: Once | ORAL | Status: AC
  Administered 2012-03-31: 10 mg via ORAL

## 2012-03-31 MED ORDER — OXYCODONE-ACETAMINOPHEN 5-325 MG PO TABS
1.0000 | ORAL_TABLET | ORAL | Status: DC | PRN
Start: 2012-03-31 — End: 2012-04-01

## 2012-03-31 MED ORDER — IOHEXOL 180 MG/ML  SOLN
20.0000 mL | Freq: Once | INTRAMUSCULAR | Status: AC | PRN
  Administered 2012-03-31: 20 mL via INTRATHECAL

## 2012-03-31 NOTE — Progress Notes (Signed)
1200 myelogram discharge and medication discharge instructions given to patient and family.  All voice understanding

## 2012-03-31 NOTE — Procedures (Signed)
Selwyn returns to the office today.  It seems that he had an exacerbation of left hip pain that was quite acute and severe.  He was seen in the emergency department.  They did some x-rays of his hip, which did not show any acute injuries to the hip itself.  Kashius had called the office last week and in my absence he was given a steroid dosepak.  He notes that this has made a positive impact, but he is still having pain in the region of the left hip.  He finds that when he first gets up in the morning, if he attempts to stretch, he will get the acute reproduction of pain in the left hip.  It does not radiate into the left leg.  He has not had any weakness in the leg or foot.   Today in the office we obtained an AP and lateral radiograph of the lumbar spine.  It appears that his alignment is quite anatomic.  He does have the slight degenerative scoliosis.  However, what is new is that there is a fracture of the left S1 screw that was not present previously.  A year ago we did a CT scan, which demonstrated that [facility] had halos around the screws, and I was concerned that he was evolving a pseudoarthrosis and, at that time, we placed him in an external fusion stimulator.  This has since run its course and he notes that he feels that the external stimulator had improved his pain considerably.  This new occurrence of pain is quite bothersome to him.  I noted to Bland that in an effort to see whether he has a solid arthrodesis and whether there is any complicating features that could be giving him this pain in the hip related to his lumbar spine, a myelogram would need to be performed.  Jewel's case is, indeed, complicated having had a number of surgeries in the lumbar spine with an extensive fusion from L1 down to the sacrum.  In an effort to get the best information, I believe a myelogram and postmyelogram CAT scan would be most beneficial.    Pre op Dx: Status post arthrodesis L1-S1, pseudoarthrosis Post op Dx:  Status post arthrodesis L1-S1, pseudoarthrosis Procedure: Lumbar myelogram Surgeon: Avondre Richens Puncture level: L3-L4 Fluid color: Clear colorless Injection: 16 cc iohexol 180 Findings: Questionable disc T10-T11

## 2012-04-06 DIAGNOSIS — IMO0002 Reserved for concepts with insufficient information to code with codable children: Secondary | ICD-10-CM | POA: Diagnosis not present

## 2012-04-08 ENCOUNTER — Telehealth: Payer: Self-pay | Admitting: Internal Medicine

## 2012-04-18 DIAGNOSIS — I10 Essential (primary) hypertension: Secondary | ICD-10-CM | POA: Diagnosis not present

## 2012-04-18 DIAGNOSIS — G5600 Carpal tunnel syndrome, unspecified upper limb: Secondary | ICD-10-CM | POA: Diagnosis not present

## 2012-04-18 DIAGNOSIS — E785 Hyperlipidemia, unspecified: Secondary | ICD-10-CM | POA: Diagnosis not present

## 2012-04-18 NOTE — Telephone Encounter (Signed)
New Problem:    Patient called in wanting to see if we have any samples of her dabigatran (PRADAXA) 150 MG CAPS available for him to have.  Also wanted to speak with Tresa Endo when she comes back into the office about something non-medical related.  Please call back.

## 2012-04-18 NOTE — Telephone Encounter (Signed)
Pt was notified that samples were left at the front desk for him.  Will forward this message to Tresa Endo, Dr Allred's nurse so that she can call him per his request.

## 2012-05-09 DIAGNOSIS — R1084 Generalized abdominal pain: Secondary | ICD-10-CM | POA: Diagnosis not present

## 2012-05-20 DIAGNOSIS — G5600 Carpal tunnel syndrome, unspecified upper limb: Secondary | ICD-10-CM | POA: Diagnosis not present

## 2012-05-26 DIAGNOSIS — E782 Mixed hyperlipidemia: Secondary | ICD-10-CM | POA: Diagnosis not present

## 2012-05-26 DIAGNOSIS — R5383 Other fatigue: Secondary | ICD-10-CM | POA: Diagnosis not present

## 2012-05-26 DIAGNOSIS — Z79899 Other long term (current) drug therapy: Secondary | ICD-10-CM | POA: Diagnosis not present

## 2012-05-26 DIAGNOSIS — R5381 Other malaise: Secondary | ICD-10-CM | POA: Diagnosis not present

## 2012-07-13 DIAGNOSIS — H4011X0 Primary open-angle glaucoma, stage unspecified: Secondary | ICD-10-CM | POA: Diagnosis not present

## 2012-07-13 DIAGNOSIS — H2510 Age-related nuclear cataract, unspecified eye: Secondary | ICD-10-CM | POA: Diagnosis not present

## 2012-07-28 ENCOUNTER — Other Ambulatory Visit: Payer: Self-pay | Admitting: Physician Assistant

## 2012-08-18 DIAGNOSIS — L821 Other seborrheic keratosis: Secondary | ICD-10-CM | POA: Diagnosis not present

## 2012-08-18 DIAGNOSIS — C44319 Basal cell carcinoma of skin of other parts of face: Secondary | ICD-10-CM | POA: Diagnosis not present

## 2012-08-18 DIAGNOSIS — D235 Other benign neoplasm of skin of trunk: Secondary | ICD-10-CM | POA: Diagnosis not present

## 2012-08-18 DIAGNOSIS — L570 Actinic keratosis: Secondary | ICD-10-CM | POA: Diagnosis not present

## 2012-08-18 DIAGNOSIS — L819 Disorder of pigmentation, unspecified: Secondary | ICD-10-CM | POA: Diagnosis not present

## 2012-08-18 DIAGNOSIS — D2360 Other benign neoplasm of skin of unspecified upper limb, including shoulder: Secondary | ICD-10-CM | POA: Diagnosis not present

## 2012-08-19 ENCOUNTER — Telehealth: Payer: Self-pay | Admitting: Internal Medicine

## 2012-08-19 ENCOUNTER — Telehealth: Payer: Self-pay | Admitting: *Deleted

## 2012-08-19 MED ORDER — DABIGATRAN ETEXILATE MESYLATE 150 MG PO CAPS: 150.0000 mg | ORAL_CAPSULE | Freq: Two times a day (BID) | ORAL | Status: DC

## 2012-08-19 NOTE — Telephone Encounter (Signed)
New problem   Pt want to get some free samples of Pradaxa 150mg . Please call pt concerning this matter.

## 2012-08-19 NOTE — Telephone Encounter (Signed)
Patient aware will pick up samples this afternoon

## 2012-08-19 NOTE — Telephone Encounter (Signed)
Will get samples and ut out front.  Patient aware

## 2012-08-31 ENCOUNTER — Other Ambulatory Visit (HOSPITAL_COMMUNITY): Payer: Self-pay | Admitting: Internal Medicine

## 2012-09-08 ENCOUNTER — Telehealth: Payer: Self-pay | Admitting: Family Medicine

## 2012-09-08 NOTE — Telephone Encounter (Signed)
Prescription for oxycodone 10 mg. 1 4 times a day when necessary. #120.this was written. Patient does need to schedule followup office visit.

## 2012-09-08 NOTE — Telephone Encounter (Signed)
Nurse: Pharm: [facility] Medication: Prescription request for pain/refill ** per patient he only has 4 pills left

## 2012-09-09 NOTE — Telephone Encounter (Signed)
Notified patient that RX is ready for pickup at front desk. Patient verbalized understanding.

## 2012-09-14 ENCOUNTER — Encounter: Payer: Self-pay | Admitting: *Deleted

## 2012-09-15 ENCOUNTER — Ambulatory Visit (INDEPENDENT_AMBULATORY_CARE_PROVIDER_SITE_OTHER): Payer: Medicare Other | Admitting: Family Medicine

## 2012-09-15 ENCOUNTER — Encounter: Payer: Self-pay | Admitting: Family Medicine

## 2012-09-15 VITALS — BP 130/86 | Temp 98.6°F | Ht 68.0 in | Wt 204.5 lb

## 2012-09-15 DIAGNOSIS — I82402 Acute embolism and thrombosis of unspecified deep veins of left lower extremity: Secondary | ICD-10-CM

## 2012-09-15 DIAGNOSIS — I82409 Acute embolism and thrombosis of unspecified deep veins of unspecified lower extremity: Secondary | ICD-10-CM

## 2012-09-15 DIAGNOSIS — M1990 Unspecified osteoarthritis, unspecified site: Secondary | ICD-10-CM

## 2012-09-15 DIAGNOSIS — M129 Arthropathy, unspecified: Secondary | ICD-10-CM

## 2012-09-15 DIAGNOSIS — G609 Hereditary and idiopathic neuropathy, unspecified: Secondary | ICD-10-CM

## 2012-09-15 MED ORDER — GABAPENTIN 100 MG PO CAPS: 100.0000 mg | ORAL_CAPSULE | Freq: Three times a day (TID) | ORAL | Status: DC

## 2012-09-15 NOTE — Progress Notes (Signed)
  Subjective:    Patient ID: [name], male    DOB: 1940/09/25, 72 y.o.   MRN: [id number]  Arthritis Presents for initial visit. The disease course has been worsening. He complains of pain and joint warmth. Affected locations include the left foot and right foot (hands).  he relates that the oxycodone he takes in the morning and in the evening does seem to help but he does not want to take more than twice a day. He states recently he figured out that her neighbor was coming to his house to visit his wife been stealing his medicine he is now secured the medicine.  This patient is frustrated in actually feeling down because of having to take blood thinner on a regular basis. He would like to be able to take Naprosyn for his arthritis. But he cannot because he is on a blood thinner. He states the cardiologist is told him that he no longer has atrial fib. He had an ablation done several years back and since then has not had any atrial fib. He states that occasional palpitation but he states he feels his heart is never out of rhythm. He relates that the cardiologist told him the reason he needs to take a blood thinner is because he had a blood clot in his leg that resulted in a pulmonary embolism. This was back in 2008.  Patient also has a history of hypertension sinusitis reflux hyperlipidemia. He tries keep this under control by good diet. He does not smoke or drink. Denies being depressed.    Review of Systems  Musculoskeletal: Positive for arthritis.  Negative for chest pressure swelling in the legs rash rectal bleeding. Negative for sore throat or any bleeding issues.     Objective:   Physical Exam Eardrums normal neck no masses. Lungs are clear no crackles respiratory rate normal heart regular. No murmurs heard abdomen soft no guarding rebound or tenderness extremities no edema arthritis noted in the hands and in the legs.       Assessment & Plan:  #1 arthritis-continue oxycodone  twice a day he does not want to go up on this 10 mg twice a day we cannot use anti-inflammatories because the risk of bleeding.  #2 hyperlipidemia recent lab work looked good I don't recommend increasing medication  #3 patient does complain of burning in his feet that I think is related to his severe back issues I would recommend Neurontin he will gradually titrate up to 13 times a day where see him back in 6 weeks to see how this is doing  #4 I sympathize with the patient regarding blood thinner. I think it is reasonable to see the hematologist to have evaluation on whether or not lifelong anticoagulation as necessary.

## 2012-09-15 NOTE — Patient Instructions (Addendum)
Gabapentin 100 mg  - start with one at night for 7 days then one 2 times a day for 2 weeks. If not better by end of month then increase to 3 times a day  - this is safe to use but if not feeling well with this then notify us  We will set up appointment with Dr.Njiestrom regarding blood clots and whether or not long term use of Pradaxa is needed  Recheck here in 6 weeks

## 2012-09-26 ENCOUNTER — Ambulatory Visit (HOSPITAL_COMMUNITY): Payer: Medicare Other | Admitting: Oncology

## 2012-09-26 NOTE — Progress Notes (Signed)
No show,  Letter sent

## 2012-09-28 ENCOUNTER — Encounter (INDEPENDENT_AMBULATORY_CARE_PROVIDER_SITE_OTHER): Payer: Self-pay | Admitting: *Deleted

## 2012-09-30 ENCOUNTER — Telehealth: Payer: Self-pay | Admitting: Family Medicine

## 2012-09-30 NOTE — Telephone Encounter (Signed)
Dr. Thornton Papas office called to say that patient missed his appointment with them 09/26/12.  They were able to get him rescheduled for 11/07/12.  They wanted you to be aware in case you wanted patient to reschedule his 10/27/12 appointment here with you until after he sees Dr. Mariel Sleet.  If his appointment here needs to be rescheduled please forward to administrative pool so someone can handle that

## 2012-10-06 ENCOUNTER — Ambulatory Visit (INDEPENDENT_AMBULATORY_CARE_PROVIDER_SITE_OTHER): Payer: Medicare Other | Admitting: Internal Medicine

## 2012-10-06 ENCOUNTER — Encounter (INDEPENDENT_AMBULATORY_CARE_PROVIDER_SITE_OTHER): Payer: Self-pay | Admitting: Internal Medicine

## 2012-10-06 VITALS — BP 124/62 | HR 64 | Ht 66.0 in | Wt 202.5 lb

## 2012-10-06 DIAGNOSIS — K219 Gastro-esophageal reflux disease without esophagitis: Secondary | ICD-10-CM

## 2012-10-06 NOTE — Progress Notes (Signed)
Subjective:     Patient ID: [name], male   DOB: Apr 30, 1941, 72 y.o.   MRN: [id number]  HPI   Here today for f/u of his GERD. He is taking the Protonix on a prn basis. He tells me he is not having any acid reflux.  Appetite is good. No weight loss. No abdominal pain.  His BMs are normal. He is not exercising. He has trouble with his back and neck.  He is also concerned about his Pradaxa. It is expensive. Hx of DVT and paroxysmal atrial fib.   03/10/2012 EGD: Impression:  Mild changes of reflux esophagitis limited to GE junction which is serrated. Biopsy taken to rule out short segment Barrett's esophagus.  Small sliding hiatal hernia.  Pyloric channel inflammation without stricture or ulcer.  Biopsy:  Notes Recorded by Malissa Hippo, MD on 03/21/2012 at 2:04 PM H. Pylori serology is negative. Patient not doing any better with pantoprazole twice a day according to his wife. Will proceed with solid phase GES Report to PCP  04/07/2012: Gastric emptying normal.    Review of Systems see hpi Current Outpatient Prescriptions  Medication Sig Dispense Refill  . benazepril (LOTENSIN) 20 MG tablet Take 20 mg by mouth daily.      . dabigatran (PRADAXA) 150 MG CAPS Take 1 capsule (150 mg total) by mouth every 12 (twelve) hours.  60 capsule  11  . gabapentin (NEURONTIN) 100 MG capsule Take 1 capsule (100 mg total) by mouth 3 (three) times daily.  90 capsule  3  . indapamide (LOZOL) 1.25 MG tablet Take 1.25 mg by mouth daily.        . pantoprazole (PROTONIX) 40 MG tablet Take 40 mg by mouth daily.      . pravastatin (PRAVACHOL) 40 MG tablet Take 40 mg by mouth every Monday, Wednesday, and Friday.      . Oxycodone HCl 10 MG TABS Take 10 mg by mouth every 6 (six) hours as needed. For pain       No current facility-administered medications for this visit.   Past Medical History  Diagnosis Date  . Paroxysmal atrial fibrillation     s/p afib ablation 01/24/10  . History of hyperglycemia   .  Hyperlipidemia   . HTN (hypertension)   . Chronic back pain     multiple back surgeries  . GERD (gastroesophageal reflux disease)   . Anxiety   . DVT (deep venous thrombosis) 03/2007    and PTE  . Cancer   . Glaucoma   . Carpal tunnel syndrome   . Pulmonary embolism 2008   Past Surgical History  Procedure Laterality Date  . Cervical and lumbar surgery    . Knee surgery      left  . Pvi  01/24/10    afib ablation  . Colonoscopy  04/24/2011    Procedure: COLONOSCOPY;  Surgeon: Malissa Hippo, MD;  Location: [facility];  Service: Endoscopy;  Laterality: N/A;  1200  . Esophagogastroduodenoscopy  03/10/2012    Procedure: ESOPHAGOGASTRODUODENOSCOPY (EGD);  Surgeon: Malissa Hippo, MD;  Location: [facility];  Service: Endoscopy;  Laterality: N/A;  325   Allergies  Allergen Reactions  . Lipitor (Atorvastatin)   . Sectral (Acebutolol Hcl)     Side effects  . Sulfonamide Derivatives Other (See Comments)    Runs blood pressure up  . Zocor (Simvastatin)     Joint pain        Objective:   Physical Exam  Filed Vitals:   10/06/12 1410  BP: 124/62  Pulse: 64  Height: 5\' 6"  (1.676 m)  Weight: 202 lb 8 oz (91.853 kg)        Assessment:   GERD. No problem at this time. No acid reflux. He is taking Protonix on a prn basis.    Plan:    OV in one year unless he has a problem.

## 2012-10-06 NOTE — Patient Instructions (Addendum)
OV in one year. If any problem, call our office.

## 2012-10-18 ENCOUNTER — Other Ambulatory Visit: Payer: Self-pay | Admitting: Physician Assistant

## 2012-10-27 ENCOUNTER — Ambulatory Visit: Payer: Medicare Other | Admitting: Family Medicine

## 2012-10-31 ENCOUNTER — Ambulatory Visit (INDEPENDENT_AMBULATORY_CARE_PROVIDER_SITE_OTHER): Payer: Medicare Other | Admitting: Family Medicine

## 2012-10-31 ENCOUNTER — Encounter: Payer: Self-pay | Admitting: Family Medicine

## 2012-10-31 VITALS — BP 148/88 | HR 70 | Wt 205.0 lb

## 2012-10-31 DIAGNOSIS — M549 Dorsalgia, unspecified: Secondary | ICD-10-CM | POA: Diagnosis not present

## 2012-10-31 DIAGNOSIS — G8929 Other chronic pain: Secondary | ICD-10-CM | POA: Diagnosis not present

## 2012-10-31 DIAGNOSIS — I10 Essential (primary) hypertension: Secondary | ICD-10-CM

## 2012-10-31 MED ORDER — RAMIPRIL 10 MG PO CAPS: 10.0000 mg | ORAL_CAPSULE | Freq: Every day | ORAL | Status: DC

## 2012-10-31 NOTE — Progress Notes (Signed)
  Subjective:    Patient ID: [name], male    DOB: 1940/11/04, 72 y.o.   MRN: [id number]  HPI Pt here for a follow up on new meds neurotin and benazapril. Pt states he wants to switch back to ramapril bc of side effects from the new bp med. Patient relates arthralgias in the hands. He states any times he grips things he did has moderate pain and discomfort no numbness into the hands. No weakness. In addition to this takes his pain medicine on regular basis denies abusing it. Denies any side effects with it. Bowel movements okay. Patient feels his blood pressure medicine is not where it needs to be he would like to change this. PMH chronic back pain, hypertension, heart disease, chronic blood thinner Family history noncontributory social does not smoke Review of Systems  Constitutional: Negative for fever, activity change and appetite change.  HENT: Negative for congestion, rhinorrhea and neck pain.   Eyes: Negative for discharge.  Respiratory: Negative for cough and wheezing.   Cardiovascular: Negative for chest pain.  Gastrointestinal: Negative for vomiting, abdominal pain and blood in stool.  Genitourinary: Negative for frequency and difficulty urinating.  Skin: Negative for rash.  Allergic/Immunologic: Negative for environmental allergies and food allergies.  Neurological: Negative for weakness and headaches.  Psychiatric/Behavioral: Negative for agitation.      Objective:   Physical Exam  Constitutional: He appears well-developed and well-nourished.  HENT:  Head: Normocephalic and atraumatic.  Eyes: EOM are normal. Pupils are equal, round, and reactive to light.  Neck: Normal range of motion. Neck supple. No thyromegaly present.  Cardiovascular: Normal rate, regular rhythm and normal heart sounds.   No murmur heard. Pulmonary/Chest: Effort normal and breath sounds normal. No respiratory distress. He has no wheezes.  Abdominal: Bowel sounds are normal.  Musculoskeletal:  Normal range of motion. He exhibits no edema.  Lymphadenopathy:    He has no cervical adenopathy.  Neurological: He is alert. He exhibits normal muscle tone.  Skin: Skin is warm and dry. No erythema.  Psychiatric: He has a normal mood and affect. His behavior is normal. Judgment normal.          Assessment & Plan:  Chronic back pain-I discussed with the patient house medication maybe use anywhere from 2-4 times a day. 2 separate prescriptions for oxycodone 10 mg were given. #60 on each prescription.  Hypertension changed back to ran a drill per patient's request no need for lab work currently Hyperlipidemia continue current medications. Tolerating Pravachol well. Check lab work in the fall time with followup visit in 3-4 months Patient states he will be following up with cardiology somewhere in the next 6 months. 25 minutes spent with patient Arthralgias in the hands Tylenol when necessary no NSAIDS

## 2012-11-07 ENCOUNTER — Ambulatory Visit (HOSPITAL_COMMUNITY): Payer: Medicare Other | Admitting: Oncology

## 2012-11-17 ENCOUNTER — Encounter (HOSPITAL_COMMUNITY): Payer: Self-pay | Admitting: Oncology

## 2012-11-28 ENCOUNTER — Telehealth: Payer: Self-pay | Admitting: Family Medicine

## 2012-11-28 ENCOUNTER — Other Ambulatory Visit: Payer: Self-pay | Admitting: *Deleted

## 2012-11-28 MED ORDER — RAMIPRIL 10 MG PO CAPS: ORAL_CAPSULE | ORAL | Status: DC

## 2012-11-28 NOTE — Telephone Encounter (Signed)
Pt's BP pills were issued for daily not BID, pt needs this called into [facility] for a 30 supply at BID and then a hand written script for 90 days BID to be sent off to mail order. Please and Thank You

## 2012-11-28 NOTE — Telephone Encounter (Signed)
[facility] day supply [facility] and 90 day script with 3 refills ready for pick up for mailorder. Pt notified on his voicemail

## 2012-12-07 DIAGNOSIS — H2510 Age-related nuclear cataract, unspecified eye: Secondary | ICD-10-CM | POA: Diagnosis not present

## 2012-12-07 DIAGNOSIS — H26499 Other secondary cataract, unspecified eye: Secondary | ICD-10-CM | POA: Diagnosis not present

## 2012-12-07 DIAGNOSIS — H4011X0 Primary open-angle glaucoma, stage unspecified: Secondary | ICD-10-CM | POA: Diagnosis not present

## 2012-12-20 ENCOUNTER — Encounter: Payer: Self-pay | Admitting: *Deleted

## 2012-12-21 ENCOUNTER — Ambulatory Visit (INDEPENDENT_AMBULATORY_CARE_PROVIDER_SITE_OTHER): Payer: Medicare Other | Admitting: Internal Medicine

## 2012-12-21 ENCOUNTER — Encounter: Payer: Self-pay | Admitting: Internal Medicine

## 2012-12-21 VITALS — BP 142/80 | HR 68 | Ht 66.0 in | Wt 203.2 lb

## 2012-12-21 DIAGNOSIS — R5381 Other malaise: Secondary | ICD-10-CM

## 2012-12-21 DIAGNOSIS — R0989 Other specified symptoms and signs involving the circulatory and respiratory systems: Secondary | ICD-10-CM

## 2012-12-21 DIAGNOSIS — I4891 Unspecified atrial fibrillation: Secondary | ICD-10-CM | POA: Diagnosis not present

## 2012-12-21 DIAGNOSIS — R0609 Other forms of dyspnea: Secondary | ICD-10-CM

## 2012-12-21 DIAGNOSIS — R5383 Other fatigue: Secondary | ICD-10-CM | POA: Insufficient documentation

## 2012-12-21 DIAGNOSIS — R0683 Snoring: Secondary | ICD-10-CM | POA: Insufficient documentation

## 2012-12-21 NOTE — Patient Instructions (Signed)
Your physician wants you to follow-up in: 6 months with Dr Jacquiline Doe will receive a reminder letter in the mail two months in advance. If you don't receive a letter, please call our office to schedule the follow-up appointment.    Your physician has requested that you have an echocardiogram. Echocardiography is a painless test that uses sound waves to create images of your heart. It provides your doctor with information about the size and shape of your heart and how well your heart's chambers and valves are working. This procedure takes approximately one hour. There are no restrictions for this procedure.   Your physician has recommended that you have a sleep study. This test records several body functions during sleep, including: brain activity, eye movement, oxygen and carbon dioxide blood levels, heart rate and rhythm, breathing rate and rhythm, the flow of air through your mouth and nose, snoring, body muscle movements, and chest and belly movement.

## 2012-12-21 NOTE — Progress Notes (Signed)
[name],MWUXL, MD  The patient presents today for routine electrophysiology followup.  Since last being seen in our clinic, the patient reports doing very well.  He is unaware of any atrial fibrillation. His primary concern remains with arthritis and tingling in his hands.  He has frequent fatigue, snores, and does not feel well rested in the morning.  Today, he denies symptoms of palpitations, chest pain, shortness of breath, orthopnea, PND, lower extremity edema, dizziness, presyncope, syncope, or neurologic sequela.    Past Medical History  Diagnosis Date  . Paroxysmal atrial fibrillation     s/p afib ablation 01/24/10  . History of hyperglycemia   . Hyperlipidemia   . HTN (hypertension)   . Chronic back pain     multiple back surgeries  . GERD (gastroesophageal reflux disease)   . Anxiety   . DVT (deep venous thrombosis) 03/2007    and PTE  . Cancer   . Glaucoma   . Carpal tunnel syndrome   . Pulmonary embolism 2008   Past Surgical History  Procedure Laterality Date  . Cervical and lumbar surgery    . Knee surgery      left  . Pvi  01/24/10    afib ablation  . Colonoscopy  04/24/2011    Procedure: COLONOSCOPY;  Surgeon: Malissa Hippo, MD;  Location: [facility];  Service: Endoscopy;  Laterality: N/A;  1200  . Esophagogastroduodenoscopy  03/10/2012    Procedure: ESOPHAGOGASTRODUODENOSCOPY (EGD);  Surgeon: Malissa Hippo, MD;  Location: [facility];  Service: Endoscopy;  Laterality: N/A;  325    Current Outpatient Prescriptions  Medication Sig Dispense Refill  . dabigatran (PRADAXA) 150 MG CAPS Take 1 capsule (150 mg total) by mouth every 12 (twelve) hours.  60 capsule  11  . indapamide (LOZOL) 1.25 MG tablet Take 1.25 mg by mouth daily.        . Oxycodone HCl 10 MG TABS Take 10 mg by mouth every 6 (six) hours as needed. For pain      . pantoprazole (PROTONIX) 40 MG tablet Take 40 mg by mouth daily.      . pravastatin (PRAVACHOL) 40 MG tablet Take 40 mg by mouth every  Monday, Wednesday, and Friday.      . ramipril (ALTACE) 10 MG capsule Take on tablet BID  60 capsule  0   No current facility-administered medications for this visit.    Allergies  Allergen Reactions  . Lipitor (Atorvastatin)   . Sectral (Acebutolol Hcl)     Side effects  . Sulfonamide Derivatives Other (See Comments)    Runs blood pressure up  . Zocor (Simvastatin)     Joint pain    History   Social History  . Marital Status: Married    Spouse Name: N/A    Number of Children: N/A  . Years of Education: N/A   Occupational History  . retired    Social History Main Topics  . Smoking status: Never Smoker   . Smokeless tobacco: Never Used  . Alcohol Use: Yes     Comment: has a h/o heavy ETOH but now rarely drinks  . Drug Use: No  . Sexually Active: Not on file   Other Topics Concern  . Not on file   Social History Narrative  . No narrative on file    Family History  Problem Relation Age of Onset  . Hypertension    . Heart disease    . Hypertension Mother   . Transient ischemic  attack Mother   . Heart disease Father   . Diabetes Brother   . Healthy Daughter   . Colon cancer Sister    Physical Exam: Filed Vitals:   12/21/12 0901  BP: 142/80  Pulse: 68  Height: 5\' 6"  (1.676 m)  Weight: 203 lb 3.2 oz (92.171 kg)    GEN- The patient is well appearing, alert and oriented x 3 today.   Head- normocephalic, atraumatic Eyes-  Sclera clear, conjunctiva pink Ears- hearing intact Oropharynx- clear Neck- supple, no JVP Lymph- no cervical lymphadenopathy Lungs- Clear to ausculation bilaterally, normal work of breathing Heart- Regular rate and rhythm, no murmurs, rubs or gallops, PMI not laterally displaced GI- soft, NT, ND, + BS Extremities- no clubbing, cyanosis, or edema MS- mild sweling in the MCP/PCP joints of both hands with tenderness Skin- no rash or lesion Psych- euthymic mood, full affect Neuro- strength and sensation are intact  ekg today reveals  sinus rhythm 63 bpm, otherwise normal ekg  Assessment and Plan:   1. afib Well controlled Continue lifelong anticoagulation  2. Fatigue/ decreased exercise tolerance/ snoring Sleep study Reports TFTs and testosterone have been checked by Dr Gerda Diss and were OK Echo to evaluate for structural changes  Return in 6 months

## 2012-12-29 ENCOUNTER — Ambulatory Visit (HOSPITAL_COMMUNITY): Payer: Medicare Other | Attending: Cardiovascular Disease

## 2012-12-29 DIAGNOSIS — I4891 Unspecified atrial fibrillation: Secondary | ICD-10-CM | POA: Diagnosis not present

## 2012-12-29 DIAGNOSIS — R5383 Other fatigue: Secondary | ICD-10-CM | POA: Insufficient documentation

## 2012-12-29 DIAGNOSIS — E785 Hyperlipidemia, unspecified: Secondary | ICD-10-CM | POA: Diagnosis not present

## 2012-12-29 DIAGNOSIS — Z86718 Personal history of other venous thrombosis and embolism: Secondary | ICD-10-CM | POA: Insufficient documentation

## 2012-12-29 DIAGNOSIS — R5381 Other malaise: Secondary | ICD-10-CM | POA: Diagnosis not present

## 2012-12-29 DIAGNOSIS — I10 Essential (primary) hypertension: Secondary | ICD-10-CM | POA: Insufficient documentation

## 2012-12-29 NOTE — Progress Notes (Signed)
Echocardiogram performed.  

## 2013-01-22 ENCOUNTER — Other Ambulatory Visit: Payer: Self-pay | Admitting: Family Medicine

## 2013-01-23 ENCOUNTER — Other Ambulatory Visit: Payer: Self-pay | Admitting: *Deleted

## 2013-01-23 ENCOUNTER — Encounter (HOSPITAL_BASED_OUTPATIENT_CLINIC_OR_DEPARTMENT_OTHER): Payer: Medicare Other

## 2013-01-23 MED ORDER — RAMIPRIL 10 MG PO CAPS: ORAL_CAPSULE | ORAL | Status: DC

## 2013-01-26 DIAGNOSIS — L570 Actinic keratosis: Secondary | ICD-10-CM | POA: Diagnosis not present

## 2013-01-26 DIAGNOSIS — C44319 Basal cell carcinoma of skin of other parts of face: Secondary | ICD-10-CM | POA: Diagnosis not present

## 2013-02-13 ENCOUNTER — Telehealth: Payer: Self-pay | Admitting: Family Medicine

## 2013-02-13 DIAGNOSIS — E782 Mixed hyperlipidemia: Secondary | ICD-10-CM

## 2013-02-13 DIAGNOSIS — Z125 Encounter for screening for malignant neoplasm of prostate: Secondary | ICD-10-CM

## 2013-02-13 DIAGNOSIS — Z79899 Other long term (current) drug therapy: Secondary | ICD-10-CM

## 2013-02-13 NOTE — Telephone Encounter (Signed)
Per protocol- lipid, liver, met 7 and psa any other labs rec

## 2013-02-13 NOTE — Telephone Encounter (Signed)
What is written is good

## 2013-02-13 NOTE — Telephone Encounter (Signed)
Patient needs BW paperwork-He would like to have this within the next few days.

## 2013-02-13 NOTE — Telephone Encounter (Signed)
Blood work ordered in Epic. Patient notified. 

## 2013-02-16 DIAGNOSIS — L259 Unspecified contact dermatitis, unspecified cause: Secondary | ICD-10-CM | POA: Diagnosis not present

## 2013-02-16 DIAGNOSIS — D485 Neoplasm of uncertain behavior of skin: Secondary | ICD-10-CM | POA: Diagnosis not present

## 2013-02-16 DIAGNOSIS — L905 Scar conditions and fibrosis of skin: Secondary | ICD-10-CM | POA: Diagnosis not present

## 2013-02-16 DIAGNOSIS — L570 Actinic keratosis: Secondary | ICD-10-CM | POA: Diagnosis not present

## 2013-02-17 DIAGNOSIS — E782 Mixed hyperlipidemia: Secondary | ICD-10-CM | POA: Diagnosis not present

## 2013-02-17 DIAGNOSIS — Z125 Encounter for screening for malignant neoplasm of prostate: Secondary | ICD-10-CM | POA: Diagnosis not present

## 2013-02-17 DIAGNOSIS — Z79899 Other long term (current) drug therapy: Secondary | ICD-10-CM | POA: Diagnosis not present

## 2013-02-17 LAB — BASIC METABOLIC PANEL WITH GFR
BUN: 22 mg/dL (ref 6–23)
CO2: 29 meq/L (ref 19–32)
Calcium: 9.5 mg/dL (ref 8.4–10.5)
Chloride: 104 meq/L (ref 96–112)
Creat: 1.02 mg/dL (ref 0.50–1.35)
Glucose, Bld: 108 mg/dL — ABNORMAL HIGH (ref 70–99)
Potassium: 4.2 meq/L (ref 3.5–5.3)
Sodium: 141 meq/L (ref 135–145)

## 2013-02-17 LAB — HEPATIC FUNCTION PANEL
ALT: 25 U/L (ref 0–53)
AST: 21 U/L (ref 0–37)
Albumin: 4.3 g/dL (ref 3.5–5.2)
Alkaline Phosphatase: 65 U/L (ref 39–117)
Bilirubin, Direct: 0.1 mg/dL (ref 0.0–0.3)
Indirect Bilirubin: 0.3 mg/dL (ref 0.0–0.9)
Total Bilirubin: 0.4 mg/dL (ref 0.3–1.2)
Total Protein: 6.7 g/dL (ref 6.0–8.3)

## 2013-02-17 LAB — LIPID PANEL
Cholesterol: 170 mg/dL (ref 0–200)
HDL: 39 mg/dL — ABNORMAL LOW (ref >39)
LDL Cholesterol: 111 mg/dL — ABNORMAL HIGH (ref 0–99)
Total CHOL/HDL Ratio: 4.4 Ratio
Triglycerides: 98 mg/dL (ref <150)
VLDL: 20 mg/dL (ref 0–40)

## 2013-02-17 LAB — PSA, MEDICARE: PSA: 2.29 ng/mL (ref <=4.00)

## 2013-02-20 ENCOUNTER — Ambulatory Visit (INDEPENDENT_AMBULATORY_CARE_PROVIDER_SITE_OTHER): Payer: Medicare Other | Admitting: Family Medicine

## 2013-02-20 ENCOUNTER — Encounter: Payer: Self-pay | Admitting: Family Medicine

## 2013-02-20 VITALS — BP 132/82 | Ht 65.0 in | Wt 207.0 lb

## 2013-02-20 DIAGNOSIS — I10 Essential (primary) hypertension: Secondary | ICD-10-CM

## 2013-02-20 DIAGNOSIS — R739 Hyperglycemia, unspecified: Secondary | ICD-10-CM

## 2013-02-20 DIAGNOSIS — G8929 Other chronic pain: Secondary | ICD-10-CM | POA: Diagnosis not present

## 2013-02-20 DIAGNOSIS — R7309 Other abnormal glucose: Secondary | ICD-10-CM | POA: Diagnosis not present

## 2013-02-20 DIAGNOSIS — J019 Acute sinusitis, unspecified: Secondary | ICD-10-CM

## 2013-02-20 DIAGNOSIS — M549 Dorsalgia, unspecified: Secondary | ICD-10-CM

## 2013-02-20 DIAGNOSIS — E785 Hyperlipidemia, unspecified: Secondary | ICD-10-CM

## 2013-02-20 LAB — POCT GLYCOSYLATED HEMOGLOBIN (HGB A1C): Hemoglobin A1C: 5.8

## 2013-02-20 MED ORDER — LEVOFLOXACIN 500 MG PO TABS: 500.0000 mg | ORAL_TABLET | Freq: Every day | ORAL | Status: AC

## 2013-02-20 MED ORDER — OXYCODONE HCL 10 MG PO TABS: 10.0000 mg | ORAL_TABLET | Freq: Four times a day (QID) | ORAL | Status: DC | PRN

## 2013-02-20 NOTE — Patient Instructions (Addendum)
DASH Diet  The DASH diet stands for "Dietary Approaches to Stop Hypertension." It is a healthy eating plan that has been shown to reduce high blood pressure (hypertension) in as little as 14 days, while also possibly providing other significant health benefits. These other health benefits include reducing the risk of breast cancer after menopause and reducing the risk of type 2 diabetes, heart disease, colon cancer, and stroke. Health benefits also include weight loss and slowing kidney failure in patients with chronic kidney disease.   DIET GUIDELINES  · Limit salt (sodium). Your diet should contain less than 1500 mg of sodium daily.  · Limit refined or processed carbohydrates. Your diet should include mostly whole grains. Desserts and added sugars should be used sparingly.  · Include small amounts of heart-healthy fats. These types of fats include nuts, oils, and tub margarine. Limit saturated and trans fats. These fats have been shown to be harmful in the body.  CHOOSING FOODS   The following food groups are based on a 2000 calorie diet. See your Registered Dietitian for individual calorie needs.  Grains and Grain Products (6 to 8 servings daily)  · Eat More Often: Whole-wheat bread, brown rice, whole-grain or wheat pasta, quinoa, popcorn without added fat or salt (air popped).  · Eat Less Often: White bread, white pasta, white rice, cornbread.  Vegetables (4 to 5 servings daily)  · Eat More Often: Fresh, frozen, and canned vegetables. Vegetables may be raw, steamed, roasted, or grilled with a minimal amount of fat.  · Eat Less Often/Avoid: Creamed or fried vegetables. Vegetables in a cheese sauce.  Fruit (4 to 5 servings daily)  · Eat More Often: All fresh, canned (in natural juice), or frozen fruits. Dried fruits without added sugar. One hundred percent fruit juice (½ cup [237 mL] daily).  · Eat Less Often: Dried fruits with added sugar. Canned fruit in light or heavy syrup.  Lean Meats, Fish, and Poultry (2  servings or less daily. One serving is 3 to 4 oz [85-114 g]).  · Eat More Often: Ninety percent or leaner ground beef, tenderloin, sirloin. Round cuts of beef, chicken breast, turkey breast. All fish. Grill, bake, or broil your meat. Nothing should be fried.  · Eat Less Often/Avoid: Fatty cuts of meat, turkey, or chicken leg, thigh, or wing. Fried cuts of meat or fish.  Dairy (2 to 3 servings)  · Eat More Often: Low-fat or fat-free milk, low-fat plain or light yogurt, reduced-fat or part-skim cheese.  · Eat Less Often/Avoid: Milk (whole, 2%). Whole milk yogurt. Full-fat cheeses.  Nuts, Seeds, and Legumes (4 to 5 servings per week)  · Eat More Often: All without added salt.  · Eat Less Often/Avoid: Salted nuts and seeds, canned beans with added salt.  Fats and Sweets (limited)  · Eat More Often: Vegetable oils, tub margarines without trans fats, sugar-free gelatin. Mayonnaise and salad dressings.  · Eat Less Often/Avoid: Coconut oils, palm oils, butter, stick margarine, cream, half and half, cookies, candy, pie.  FOR MORE INFORMATION  The Dash Diet Eating Plan: www.dashdiet.org  Document Released: 04/23/2011 Document Revised: 07/27/2011 Document Reviewed: 04/23/2011  [employer]® Patient Information ©2014 [employer].

## 2013-02-20 NOTE — Progress Notes (Signed)
  Subjective:    Patient ID: [name], male    DOB: [birth year], 72 y.o.   MRN: [id number]  HPI Patient arrives to follow up on chronic pain and discuss recent lab results. Uses oxycodone This patient was seen today for chronic pain  Eating healthy diet, trying to stay active  The medication list was reviewed and updated.  Discussion was held with the patient regarding compliance with pain medication. The patient was advised the importance of maintaining medication and not using illegal substances with these. The patient was educated that we can provide 3 monthly scripts for their medication, it is their responsibility to follow the instructions. Discussion was held with the patient to make sure they're not having significant side effects. Patient is aware that pain medications are meant to minimize the severity of the pain to allow their pain levels to improve to allow for better function. They are aware of that pain medications cannot totally remove their pain.  This patient also wanted to go over his lab work including a PSA lipid profile metabolic 7. All this was discussed in detail. His glucose slightly elevated. We did check a hemoglobin A1c that came back at 5.8 looked good. I talked with him at length about prediabetes and the importance of watching his sugars. He does relate chronic back pain and discomfort. He does not want to have anymore surgery. He does his best to try to manage with all of this. Past medical history hypertension atrial fib chronic back pain hyperlipidemia hypertension Has history chronic back pain  Pradaxa- cost, no bleeding issues except with scratches Review of Systems Denies any chest tightness pressure pain shortness breath nausea vomiting diarrhea. Relates significant lower back pain and joint pains    Objective:   Physical Exam Lungs are clear heart irregular rate controlled abdomen soft extremities no edema osteoarthritis of the knees and arthritis  of the lower back noted Prostate exam normal      Assessment & Plan:  Pain- oxycodone max use 2 to 4 per day, prescriptions written. Followup in 3 months. Hyperlipidemia decent control continue current measures Hypertension good control continue current measures DASH diet followup if problems Hyperglycemia A1c looks good low starch diet

## 2013-03-22 ENCOUNTER — Ambulatory Visit (INDEPENDENT_AMBULATORY_CARE_PROVIDER_SITE_OTHER): Payer: Medicare Other | Admitting: *Deleted

## 2013-03-22 DIAGNOSIS — Z23 Encounter for immunization: Secondary | ICD-10-CM

## 2013-06-12 DIAGNOSIS — H26499 Other secondary cataract, unspecified eye: Secondary | ICD-10-CM | POA: Diagnosis not present

## 2013-06-12 DIAGNOSIS — H2510 Age-related nuclear cataract, unspecified eye: Secondary | ICD-10-CM | POA: Diagnosis not present

## 2013-06-12 DIAGNOSIS — H4011X0 Primary open-angle glaucoma, stage unspecified: Secondary | ICD-10-CM | POA: Diagnosis not present

## 2013-06-29 ENCOUNTER — Encounter: Payer: Self-pay | Admitting: Internal Medicine

## 2013-06-29 ENCOUNTER — Ambulatory Visit (INDEPENDENT_AMBULATORY_CARE_PROVIDER_SITE_OTHER): Payer: Medicare Other | Admitting: Internal Medicine

## 2013-06-29 VITALS — BP 138/84 | HR 76 | Ht 65.0 in | Wt 195.0 lb

## 2013-06-29 DIAGNOSIS — R5381 Other malaise: Secondary | ICD-10-CM

## 2013-06-29 DIAGNOSIS — I4891 Unspecified atrial fibrillation: Secondary | ICD-10-CM

## 2013-06-29 DIAGNOSIS — R5383 Other fatigue: Secondary | ICD-10-CM

## 2013-06-29 DIAGNOSIS — E785 Hyperlipidemia, unspecified: Secondary | ICD-10-CM

## 2013-06-29 DIAGNOSIS — I10 Essential (primary) hypertension: Secondary | ICD-10-CM | POA: Diagnosis not present

## 2013-06-29 MED ORDER — DABIGATRAN ETEXILATE MESYLATE 150 MG PO CAPS: 150.0000 mg | ORAL_CAPSULE | Freq: Two times a day (BID) | ORAL | Status: DC

## 2013-06-29 NOTE — Progress Notes (Signed)
NGE:XBMWUX,LKGMW, MD  The patient presents today for routine electrophysiology followup.  Since last being seen in our clinic, the patient reports doing very well.  He is unaware of any atrial fibrillation. His primary concern remains back pain.  He has some fatigue but did not have his sleep study as I previously recommended.  Today, he denies symptoms of palpitations, chest pain, shortness of breath, orthopnea, PND, lower extremity edema, dizziness, presyncope, syncope, or neurologic sequela.    Past Medical History  Diagnosis Date  . Paroxysmal atrial fibrillation     s/p afib ablation 01/24/10  . History of hyperglycemia   . Hyperlipidemia   . HTN (hypertension)   . Chronic back pain     multiple back surgeries  . GERD (gastroesophageal reflux disease)   . Anxiety   . DVT (deep venous thrombosis) 03/2007    and PTE  . Cancer   . Glaucoma   . Carpal tunnel syndrome   . Pulmonary embolism 2008   Past Surgical History  Procedure Laterality Date  . Cervical and lumbar surgery    . Knee surgery      left  . Pvi  01/24/10    afib ablation  . Colonoscopy  04/24/2011    Procedure: COLONOSCOPY;  Surgeon: Rogene Houston, MD;  Location: [facility];  Service: Endoscopy;  Laterality: N/A;  1200  . Esophagogastroduodenoscopy  03/10/2012    Procedure: ESOPHAGOGASTRODUODENOSCOPY (EGD);  Surgeon: Rogene Houston, MD;  Location: [facility];  Service: Endoscopy;  Laterality: N/A;  325    Current Outpatient Prescriptions  Medication Sig Dispense Refill  . dabigatran (PRADAXA) 150 MG CAPS Take 1 capsule (150 mg total) by mouth every 12 (twelve) hours.  60 capsule  11  . indapamide (LOZOL) 1.25 MG tablet Take 1.25 mg by mouth daily.        . Oxycodone HCl 10 MG TABS Take 1 tablet (10 mg total) by mouth every 6 (six) hours as needed. For pain  120 tablet  0  . pantoprazole (PROTONIX) 40 MG tablet Take 40 mg by mouth daily.      . pravastatin (PRAVACHOL) 40 MG tablet Take 40 mg by mouth every  Monday, Wednesday, and Friday.      . ramipril (ALTACE) 10 MG capsule Take 10 mg by mouth 2 (two) times daily.       No current facility-administered medications for this visit.    Allergies  Allergen Reactions  . Lipitor [Atorvastatin]   . Sectral [Acebutolol Hcl]     Side effects  . Sulfonamide Derivatives Other (See Comments)    Runs blood pressure up  . Zocor [Simvastatin]     Joint pain    History   Social History  . Marital Status: Married    Spouse Name: N/A    Number of Children: N/A  . Years of Education: N/A   Occupational History  . retired    Social History Main Topics  . Smoking status: Never Smoker   . Smokeless tobacco: Never Used  . Alcohol Use: Yes     Comment: has a h/o heavy ETOH but now rarely drinks  . Drug Use: No  . Sexual Activity: Not on file   Other Topics Concern  . Not on file   Social History Narrative  . No narrative on file    Family History  Problem Relation Age of Onset  . Hypertension    . Heart disease    . Hypertension Mother   .  Transient ischemic attack Mother   . Heart disease Father   . Diabetes Brother   . Healthy Daughter   . Colon cancer Sister    Physical Exam: Filed Vitals:   06/29/13 1103  BP: 138/84  Pulse: 76  Height: 5\' 5"  (1.651 m)  Weight: 195 lb (88.451 kg)    GEN- The patient is well appearing, alert and oriented x 3 today.   Head- normocephalic, atraumatic Eyes-  Sclera clear, conjunctiva pink Ears- hearing intact Oropharynx- clear Neck- supple, no JVP Lymph- no cervical lymphadenopathy Lungs- Clear to ausculation bilaterally, normal work of breathing Heart- Regular rate and rhythm, no murmurs, rubs or gallops, PMI not laterally displaced GI- soft, NT, ND, + BS Extremities- no clubbing, cyanosis, or edema MS- mild sweling in the MCP/PCP joints of both hands with tenderness Skin- no rash or lesion Psych- euthymic mood, full affect Neuro- strength and sensation are intact  ekg today  reveals sinus rhythm, otherwise normal ekg Echo 2014 reviewed with patient  Assessment and Plan:   1. afib Well controlled chads2vasc score is at least 2 Continue lifelong anticoagulation  10/14 labs are reviewed  2. Fatigue/ decreased exercise tolerance/ snoring He declines sleep study  3. HL Stable No change required today  4. HTN Stable No change required today    Return in 12 months

## 2013-06-29 NOTE — Patient Instructions (Signed)
Your physician wants you to follow-up in: 12 months with Dr Allred You will receive a reminder letter in the mail two months in advance. If you don't receive a letter, please call our office to schedule the follow-up appointment.  

## 2013-07-02 ENCOUNTER — Other Ambulatory Visit (HOSPITAL_COMMUNITY): Payer: Self-pay | Admitting: Internal Medicine

## 2013-07-18 ENCOUNTER — Ambulatory Visit (INDEPENDENT_AMBULATORY_CARE_PROVIDER_SITE_OTHER): Payer: Medicare Other | Admitting: Family Medicine

## 2013-07-18 ENCOUNTER — Encounter: Payer: Self-pay | Admitting: Family Medicine

## 2013-07-18 VITALS — BP 122/72 | Ht 66.0 in | Wt 201.0 lb

## 2013-07-18 DIAGNOSIS — I10 Essential (primary) hypertension: Secondary | ICD-10-CM

## 2013-07-18 DIAGNOSIS — R358 Other polyuria: Secondary | ICD-10-CM

## 2013-07-18 DIAGNOSIS — M549 Dorsalgia, unspecified: Secondary | ICD-10-CM | POA: Diagnosis not present

## 2013-07-18 DIAGNOSIS — R3589 Other polyuria: Secondary | ICD-10-CM | POA: Diagnosis not present

## 2013-07-18 DIAGNOSIS — G8929 Other chronic pain: Secondary | ICD-10-CM | POA: Diagnosis not present

## 2013-07-18 LAB — POCT URINALYSIS DIPSTICK
Spec Grav, UA: <=1.005
pH, UA: 5

## 2013-07-18 MED ORDER — OXYCODONE HCL 10 MG PO TABS: 10.0000 mg | ORAL_TABLET | Freq: Four times a day (QID) | ORAL | Status: DC | PRN

## 2013-07-18 NOTE — Progress Notes (Signed)
   Subjective:    Patient ID: [name], male    DOB: 1940/09/28, 73 y.o.   MRN: [id number]  Hyperlipidemia This is a chronic problem. The current episode started more than 1 year ago.   he does state he takes his medicine on a regular basis. Tries to eat somewhat healthy.  He denies any reflux problems takes his medicine denies heartburn. Patient states no concerns today. Needs refills on meds.  Patient does state that he has chronic pain worse in his hands worse in his back and his neck. He doesn't not want to take pain medicine more than 2-4 times per day he states it helps what the function. He denies misusing it. In addition to this he does relate he would like to take arthritis medicine I explained to him that increases her risk for accidental bleeding that could be deadly.  Review of Systems He denies chest pain shortness of breath rectal bleeding hematuria denies headaches numbness tingling    Objective:   Physical Exam Heart is irregular rate is controlled lungs no crackles he does have significant subjective discomfort in his back his neck his hands. Abdomen soft      Assessment & Plan:  #1 chronic pain-refill on medications given he may use oxycodone up for times per day typically he takes anywhere between 2 and 4 per day it does help him function he denies abusing it Stay off of all NSAIDs.   #2 atrial fibrillation under good control continue current measures he is on a blood thinner he  #3 arthritis in his hands elbows and back offered referral to rheumatology but I really don't think there is anything they can offer him patient defers currently he cannot take NSAIDs because of the blood thinner he is on.  #4 HTN under good control  Followup in 3-4 months  Because of his back pain he was worried about kidney cancer he wanted to give a urine specimen it looked good there was no signs of blood cells in it I do not feel that this patient needs ultrasound of the  kidneys. Followup again in 3-4 months.

## 2013-07-25 ENCOUNTER — Encounter (INDEPENDENT_AMBULATORY_CARE_PROVIDER_SITE_OTHER): Payer: Self-pay | Admitting: *Deleted

## 2013-07-26 DIAGNOSIS — Z6832 Body mass index (BMI) 32.0-32.9, adult: Secondary | ICD-10-CM | POA: Diagnosis not present

## 2013-07-26 DIAGNOSIS — M479 Spondylosis, unspecified: Secondary | ICD-10-CM | POA: Diagnosis not present

## 2013-07-26 DIAGNOSIS — I10 Essential (primary) hypertension: Secondary | ICD-10-CM | POA: Diagnosis not present

## 2013-07-26 DIAGNOSIS — R0789 Other chest pain: Secondary | ICD-10-CM | POA: Diagnosis not present

## 2013-09-22 ENCOUNTER — Telehealth: Payer: Self-pay | Admitting: *Deleted

## 2013-09-22 NOTE — Telephone Encounter (Signed)
1 mo Pradaxa 150mg  samples left at front desk for pt pick up (per pt request)

## 2013-10-05 ENCOUNTER — Telehealth: Payer: Self-pay | Admitting: Family Medicine

## 2013-10-05 DIAGNOSIS — R7301 Impaired fasting glucose: Secondary | ICD-10-CM

## 2013-10-05 DIAGNOSIS — I10 Essential (primary) hypertension: Secondary | ICD-10-CM | POA: Diagnosis not present

## 2013-10-05 DIAGNOSIS — Z6832 Body mass index (BMI) 32.0-32.9, adult: Secondary | ICD-10-CM | POA: Diagnosis not present

## 2013-10-05 DIAGNOSIS — Z79899 Other long term (current) drug therapy: Secondary | ICD-10-CM

## 2013-10-05 DIAGNOSIS — E782 Mixed hyperlipidemia: Secondary | ICD-10-CM

## 2013-10-05 DIAGNOSIS — Z981 Arthrodesis status: Secondary | ICD-10-CM | POA: Diagnosis not present

## 2013-10-05 NOTE — Telephone Encounter (Signed)
Last bloodwork 02/17/2013 lipid, liver, bmo, psa and last A1C 02/20/13

## 2013-10-05 NOTE — Telephone Encounter (Signed)
Patient needs order for blood work. °

## 2013-10-06 ENCOUNTER — Other Ambulatory Visit (HOSPITAL_COMMUNITY): Payer: Self-pay | Admitting: Neurological Surgery

## 2013-10-06 DIAGNOSIS — M479 Spondylosis, unspecified: Secondary | ICD-10-CM

## 2013-10-06 NOTE — Telephone Encounter (Signed)
Bloodwork ordered. Left message on voicemail notifying patient.  

## 2013-10-06 NOTE — Telephone Encounter (Signed)
Lipid, liver, metabolic 7, hemoglobin A1c 

## 2013-10-10 DIAGNOSIS — E782 Mixed hyperlipidemia: Secondary | ICD-10-CM | POA: Diagnosis not present

## 2013-10-10 DIAGNOSIS — R7301 Impaired fasting glucose: Secondary | ICD-10-CM | POA: Diagnosis not present

## 2013-10-10 DIAGNOSIS — Z79899 Other long term (current) drug therapy: Secondary | ICD-10-CM | POA: Diagnosis not present

## 2013-10-10 LAB — BASIC METABOLIC PANEL
BUN: 19 mg/dL (ref 6–23)
CO2: 30 mEq/L (ref 19–32)
Calcium: 9.4 mg/dL (ref 8.4–10.5)
Chloride: 102 mEq/L (ref 96–112)
Creat: 1.06 mg/dL (ref 0.50–1.35)
Glucose, Bld: 120 mg/dL — ABNORMAL HIGH (ref 70–99)
Potassium: 4.5 mEq/L (ref 3.5–5.3)
Sodium: 138 mEq/L (ref 135–145)

## 2013-10-10 LAB — LIPID PANEL
Cholesterol: 156 mg/dL (ref 0–200)
HDL: 40 mg/dL (ref >39)
LDL Cholesterol: 95 mg/dL (ref 0–99)
Total CHOL/HDL Ratio: 3.9 Ratio
Triglycerides: 105 mg/dL (ref <150)
VLDL: 21 mg/dL (ref 0–40)

## 2013-10-10 LAB — HEMOGLOBIN A1C
Hgb A1c MFr Bld: 6.3 % — ABNORMAL HIGH (ref <5.7)
Mean Plasma Glucose: 134 mg/dL — ABNORMAL HIGH (ref <117)

## 2013-10-10 LAB — HEPATIC FUNCTION PANEL
ALT: 23 U/L (ref 0–53)
AST: 22 U/L (ref 0–37)
Albumin: 4.5 g/dL (ref 3.5–5.2)
Alkaline Phosphatase: 62 U/L (ref 39–117)
Bilirubin, Direct: 0.1 mg/dL (ref 0.0–0.3)
Indirect Bilirubin: 0.5 mg/dL (ref 0.2–1.2)
Total Bilirubin: 0.6 mg/dL (ref 0.2–1.2)
Total Protein: 6.7 g/dL (ref 6.0–8.3)

## 2013-10-11 ENCOUNTER — Ambulatory Visit (HOSPITAL_COMMUNITY)
Admission: RE | Admit: 2013-10-11 | Discharge: 2013-10-11 | Disposition: A | Discharge status: Home / Self Care | Payer: Medicare Other | Source: Ambulatory Visit | Attending: Neurological Surgery | Admitting: Neurological Surgery

## 2013-10-11 DIAGNOSIS — M5124 Other intervertebral disc displacement, thoracic region: Secondary | ICD-10-CM | POA: Diagnosis not present

## 2013-10-11 DIAGNOSIS — M545 Low back pain, unspecified: Secondary | ICD-10-CM | POA: Diagnosis not present

## 2013-10-11 DIAGNOSIS — R5383 Other fatigue: Secondary | ICD-10-CM

## 2013-10-11 DIAGNOSIS — R5381 Other malaise: Secondary | ICD-10-CM | POA: Diagnosis not present

## 2013-10-11 DIAGNOSIS — T84498A Other mechanical complication of other internal orthopedic devices, implants and grafts, initial encounter: Secondary | ICD-10-CM | POA: Diagnosis not present

## 2013-10-11 DIAGNOSIS — M5137 Other intervertebral disc degeneration, lumbosacral region: Secondary | ICD-10-CM | POA: Diagnosis not present

## 2013-10-11 DIAGNOSIS — M47817 Spondylosis without myelopathy or radiculopathy, lumbosacral region: Secondary | ICD-10-CM | POA: Diagnosis not present

## 2013-10-11 DIAGNOSIS — Z981 Arthrodesis status: Secondary | ICD-10-CM | POA: Insufficient documentation

## 2013-10-11 DIAGNOSIS — I700 Atherosclerosis of aorta: Secondary | ICD-10-CM | POA: Diagnosis not present

## 2013-10-11 DIAGNOSIS — M479 Spondylosis, unspecified: Secondary | ICD-10-CM

## 2013-10-11 DIAGNOSIS — Z9181 History of falling: Secondary | ICD-10-CM | POA: Insufficient documentation

## 2013-10-11 DIAGNOSIS — M4120 Other idiopathic scoliosis, site unspecified: Secondary | ICD-10-CM | POA: Insufficient documentation

## 2013-10-11 DIAGNOSIS — Y831 Surgical operation with implant of artificial internal device as the cause of abnormal reaction of the patient, or of later complication, without mention of misadventure at the time of the procedure: Secondary | ICD-10-CM | POA: Insufficient documentation

## 2013-10-11 DIAGNOSIS — I708 Atherosclerosis of other arteries: Secondary | ICD-10-CM | POA: Diagnosis not present

## 2013-10-11 DIAGNOSIS — Q762 Congenital spondylolisthesis: Secondary | ICD-10-CM | POA: Insufficient documentation

## 2013-10-11 NOTE — Telephone Encounter (Signed)
Patient notified and verbalized understanding of the test results. No further questions. 

## 2013-10-12 ENCOUNTER — Ambulatory Visit (INDEPENDENT_AMBULATORY_CARE_PROVIDER_SITE_OTHER): Payer: Medicare Other | Admitting: Family Medicine

## 2013-10-12 ENCOUNTER — Encounter: Payer: Self-pay | Admitting: Family Medicine

## 2013-10-12 VITALS — BP 148/80 | Ht 66.0 in | Wt 202.0 lb

## 2013-10-12 DIAGNOSIS — M79604 Pain in right leg: Secondary | ICD-10-CM

## 2013-10-12 DIAGNOSIS — I10 Essential (primary) hypertension: Secondary | ICD-10-CM | POA: Diagnosis not present

## 2013-10-12 DIAGNOSIS — G8929 Other chronic pain: Secondary | ICD-10-CM

## 2013-10-12 DIAGNOSIS — R7303 Prediabetes: Secondary | ICD-10-CM

## 2013-10-12 DIAGNOSIS — R7309 Other abnormal glucose: Secondary | ICD-10-CM

## 2013-10-12 DIAGNOSIS — M549 Dorsalgia, unspecified: Secondary | ICD-10-CM | POA: Diagnosis not present

## 2013-10-12 DIAGNOSIS — M79605 Pain in left leg: Secondary | ICD-10-CM

## 2013-10-12 DIAGNOSIS — M79609 Pain in unspecified limb: Secondary | ICD-10-CM | POA: Diagnosis not present

## 2013-10-12 MED ORDER — RAMIPRIL 10 MG PO CAPS: 10.0000 mg | ORAL_CAPSULE | Freq: Two times a day (BID) | ORAL | Status: DC

## 2013-10-12 MED ORDER — OXYCODONE HCL 10 MG PO TABS: 10.0000 mg | ORAL_TABLET | Freq: Four times a day (QID) | ORAL | Status: DC | PRN

## 2013-10-12 MED ORDER — INDAPAMIDE 1.25 MG PO TABS: 1.2500 mg | ORAL_TABLET | Freq: Every day | ORAL | Status: DC

## 2013-10-12 MED ORDER — PRAVASTATIN SODIUM 40 MG PO TABS: 40.0000 mg | ORAL_TABLET | Freq: Every day | ORAL | Status: DC

## 2013-10-12 MED ORDER — PRAVASTATIN SODIUM 40 MG PO TABS: 40.0000 mg | ORAL_TABLET | ORAL | Status: DC

## 2013-10-12 NOTE — Progress Notes (Signed)
   Subjective:    Patient ID: [name], male    DOB: Jul 28, 1940, 73 y.o.   MRN: [id number]  HPIHypertension check up. Blood pressure has been running high. Had blood work on 5/26.  Back pain reviewed over his CAT scan he sees a specialist next week pain medicine as necessary  Glucose/diet long discussion regarding healthy diet  fatigue-he relates this to not being as active as he used to be-we had a long discussion regarding healthy diet.  Review of Systems  Constitutional: Negative for activity change, appetite change and fatigue.  HENT: Negative for congestion.   Respiratory: Negative for cough.   Cardiovascular: Negative for chest pain.  Gastrointestinal: Negative for abdominal pain.  Endocrine: Negative for polydipsia and polyphagia.  Neurological: Negative for weakness.  Psychiatric/Behavioral: Negative for confusion.       Objective:   Physical Exam  Vitals reviewed. Constitutional: He appears well-nourished. No distress.  Cardiovascular: Normal rate, regular rhythm and normal heart sounds.   No murmur heard. Pulmonary/Chest: Effort normal and breath sounds normal. No respiratory distress.  Musculoskeletal: He exhibits no edema.  Lymphadenopathy:    He has no cervical adenopathy.  Neurological: He is alert.  Psychiatric: His behavior is normal.          Assessment & Plan:  1. Prediabetes He is to work hard on his diet try to get this corrected without adding more medicines recheck A1c in approximately 3-4 months  2. Leg pain, bilateral Patient complains of bilateral leg pain worse when he stands, walks he has risk factors for PVD and decreased pulses in his feet we will go ahead and check study. Blood pressure very good - US Arterial Seg Multiple  3. HYPERTENSION, BENIGN Blood pressure very good today continue current measures  4. Chronic back pain greater than 3 months duration Continue pain medicine up to 4 times a day as needed  25 minutes spent  with patient

## 2013-10-12 NOTE — Patient Instructions (Addendum)
Diabetes Meal Planning Guide The diabetes meal planning guide is a tool to help you plan your meals and snacks. It is important for people with diabetes to manage their blood glucose (sugar) levels. Choosing the right foods and the right amounts throughout your day will help control your blood glucose. Eating right can even help you improve your blood pressure and reach or maintain a healthy weight. CARBOHYDRATE COUNTING MADE EASY When you eat carbohydrates, they turn to sugar. This raises your blood glucose level. Counting carbohydrates can help you control this level so you feel better. When you plan your meals by counting carbohydrates, you can have more flexibility in what you eat and balance your medicine with your food intake. Carbohydrate counting simply means adding up the total amount of carbohydrate grams in your meals and snacks. Try to eat about the same amount at each meal. Foods with carbohydrates are listed below. Each portion below is 1 carbohydrate serving or 15 grams of carbohydrates. Ask your dietician how many grams of carbohydrates you should eat at each meal or snack. Grains and Starches  1 slice bread.   English muffin or hotdog/hamburger bun.   cup cold cereal (unsweetened).   cup cooked pasta or rice.   cup starchy vegetables (corn, potatoes, peas, beans, winter squash).  1 tortilla (6 inches).   bagel.  1 waffle or pancake (size of a CD).   cup cooked cereal.  4 to 6 small crackers. *Whole grain is recommended. Fruit  1 cup fresh unsweetened berries, melon, papaya, pineapple.  1 small fresh fruit.   banana or mango.   cup fruit juice (4 oz unsweetened).   cup canned fruit in natural juice or water.  2 tbs dried fruit.  12 to 15 grapes or cherries. Milk and Yogurt  1 cup fat-free or 1% milk.  1 cup soy milk.  6 oz light yogurt with sugar-free sweetener.  6 oz low-fat soy yogurt.  6 oz plain yogurt. Vegetables  1 cup raw or  cup  cooked is counted as 0 carbohydrates or a "free" food.  If you eat 3 or more servings at 1 meal, count them as 1 carbohydrate serving. Other Carbohydrates   oz chips or pretzels.   cup ice cream or frozen yogurt.   cup sherbet or sorbet.  2 inch square cake, no frosting.  1 tbs honey, sugar, jam, jelly, or syrup.  2 small cookies.  3 squares of graham crackers.  3 cups popcorn.  6 crackers.  1 cup broth-based soup.  Count 1 cup casserole or other mixed foods as 2 carbohydrate servings.  Foods with less than 20 calories in a serving may be counted as 0 carbohydrates or a "free" food. You may want to purchase a book or computer software that lists the carbohydrate gram counts of different foods. In addition, the nutrition facts panel on the labels of the foods you eat are a good source of this information. The label will tell you how big the serving size is and the total number of carbohydrate grams you will be eating per serving. Divide this number by 15 to obtain the number of carbohydrate servings in a portion. Remember, 1 carbohydrate serving equals 15 grams of carbohydrate. SERVING SIZES Measuring foods and serving sizes helps you make sure you are getting the right amount of food. The list below tells how big or small some common serving sizes are.  1 oz.........4 stacked dice.  3 oz.........Deck of cards.  1 tsp........Tip   of little finger.  1 tbs........Thumb.  2 tbs........Golf ball.   cup.......Half of a fist.  1 cup........A fist. SAMPLE DIABETES MEAL PLAN Below is a sample meal plan that includes foods from the grain and starches, dairy, vegetable, fruit, and meat groups. A dietician can individualize a meal plan to fit your calorie needs and tell you the number of servings needed from each food group. However, controlling the total amount of carbohydrates in your meal or snack is more important than making sure you include all of the food groups at every  meal. You may interchange carbohydrate containing foods (dairy, starches, and fruits). The meal plan below is an example of a 2000 calorie diet using carbohydrate counting. This meal plan has 17 carbohydrate servings. Breakfast  1 cup oatmeal (2 carb servings).   cup light yogurt (1 carb serving).  1 cup blueberries (1 carb serving).   cup almonds. Snack  1 large apple (2 carb servings).  1 low-fat string cheese stick. Lunch  Chicken breast salad.  1 cup spinach.   cup chopped tomatoes.  2 oz chicken breast, sliced.  2 tbs low-fat Italian dressing.  12 whole-wheat crackers (2 carb servings).  12 to 15 grapes (1 carb serving).  1 cup low-fat milk (1 carb serving). Snack  1 cup carrots.   cup hummus (1 carb serving). Dinner  3 oz broiled salmon.  1 cup brown rice (3 carb servings). Snack  1  cups steamed broccoli (1 carb serving) drizzled with 1 tsp olive oil and lemon juice.  1 cup light pudding (2 carb servings). DIABETES MEAL PLANNING WORKSHEET Your dietician can use this worksheet to help you decide how many servings of foods and what types of foods are right for you.  BREAKFAST Food Group and Servings / Carb Servings Grain/Starches __________________________________ Dairy __________________________________________ Vegetable ______________________________________ Fruit ___________________________________________ Meat __________________________________________ Fat ____________________________________________ LUNCH Food Group and Servings / Carb Servings Grain/Starches ___________________________________ Dairy ___________________________________________ Fruit ____________________________________________ Meat ___________________________________________ Fat _____________________________________________ DINNER Food Group and Servings / Carb Servings Grain/Starches ___________________________________ Dairy  ___________________________________________ Fruit ____________________________________________ Meat ___________________________________________ Fat _____________________________________________ SNACKS Food Group and Servings / Carb Servings Grain/Starches ___________________________________ Dairy ___________________________________________ Vegetable _______________________________________ Fruit ____________________________________________ Meat ___________________________________________ Fat _____________________________________________ DAILY TOTALS Starches _________________________ Vegetable ________________________ Fruit ____________________________ Dairy ____________________________ Meat ____________________________ Fat ______________________________ Document Released: 01/29/2005 Document Revised: 07/27/2011 Document Reviewed: 12/10/2008 Exit[employer] Patient Information 2014 [employer] DASH Diet The DASH diet stands for "Dietary Approaches to Stop Hypertension." It is a healthy eating plan that has been shown to reduce high blood pressure (hypertension) in as little as 14 days, while also possibly providing other significant health benefits. These other health benefits include reducing the risk of breast cancer after menopause and reducing the risk of type 2 diabetes, heart disease, colon cancer, and stroke. Health benefits also include weight loss and slowing kidney failure in patients with chronic kidney disease.  DIET GUIDELINES  Limit salt (sodium). Your diet should contain less than 1500 mg of sodium daily.  Limit refined or processed carbohydrates. Your diet should include mostly whole grains. Desserts and added sugars should be used sparingly.  Include small amounts of heart-healthy fats. These types of fats include nuts, oils, and tub margarine. Limit saturated and trans fats. These fats have been shown to be harmful in the body. CHOOSING FOODS  The following food groups  are based on a 2000 calorie diet. See your Registered Dietitian for individual calorie needs. Grains and Grain Products (6 to 8 servings daily)  Eat More Often:   Whole-wheat bread, brown rice, whole-grain or wheat pasta, quinoa, popcorn without added fat or salt (air popped).  Eat Less Often: White bread, white pasta, white rice, cornbread. Vegetables (4 to 5 servings daily)  Eat More Often: Fresh, frozen, and canned vegetables. Vegetables may be raw, steamed, roasted, or grilled with a minimal amount of fat.  Eat Less Often/Avoid: Creamed or fried vegetables. Vegetables in a cheese sauce. Fruit (4 to 5 servings daily)  Eat More Often: All fresh, canned (in natural juice), or frozen fruits. Dried fruits without added sugar. One hundred percent fruit juice ( cup [237 mL] daily).  Eat Less Often: Dried fruits with added sugar. Canned fruit in light or heavy syrup. Lean Meats, Fish, and Poultry (2 servings or less daily. One serving is 3 to 4 oz [85-114 g]).  Eat More Often: Ninety percent or leaner ground beef, tenderloin, sirloin. Round cuts of beef, chicken breast, turkey breast. All fish. Grill, bake, or broil your meat. Nothing should be fried.  Eat Less Often/Avoid: Fatty cuts of meat, turkey, or chicken leg, thigh, or wing. Fried cuts of meat or fish. Dairy (2 to 3 servings)  Eat More Often: Low-fat or fat-free milk, low-fat plain or light yogurt, reduced-fat or part-skim cheese.  Eat Less Often/Avoid: Milk (whole, 2%).Whole milk yogurt. Full-fat cheeses. Nuts, Seeds, and Legumes (4 to 5 servings per week)  Eat More Often: All without added salt.  Eat Less Often/Avoid: Salted nuts and seeds, canned beans with added salt. Fats and Sweets (limited)  Eat More Often: Vegetable oils, tub margarines without trans fats, sugar-free gelatin. Mayonnaise and salad dressings.  Eat Less Often/Avoid: Coconut oils, palm oils, butter, stick margarine, cream, half and half, cookies, candy,  pie. FOR MORE INFORMATION The Dash Diet Eating Plan: www.dashdiet.org Document Released: 04/23/2011 Document Revised: 07/27/2011 Document Reviewed: 04/23/2011 Exit[employer] Patient Information 2014 [employer].  

## 2013-10-17 ENCOUNTER — Ambulatory Visit (HOSPITAL_COMMUNITY)
Admission: RE | Admit: 2013-10-17 | Discharge: 2013-10-17 | Disposition: A | Discharge status: Home / Self Care | Payer: Medicare Other | Source: Ambulatory Visit | Attending: Family Medicine | Admitting: Family Medicine

## 2013-10-17 DIAGNOSIS — R209 Unspecified disturbances of skin sensation: Secondary | ICD-10-CM | POA: Insufficient documentation

## 2013-10-17 DIAGNOSIS — E7800 Pure hypercholesterolemia, unspecified: Secondary | ICD-10-CM | POA: Insufficient documentation

## 2013-10-17 DIAGNOSIS — Z87891 Personal history of nicotine dependence: Secondary | ICD-10-CM | POA: Insufficient documentation

## 2013-10-17 DIAGNOSIS — I10 Essential (primary) hypertension: Secondary | ICD-10-CM | POA: Diagnosis not present

## 2013-10-18 NOTE — Progress Notes (Signed)
Pt notified and verbalized understanding.

## 2013-11-21 ENCOUNTER — Ambulatory Visit (INDEPENDENT_AMBULATORY_CARE_PROVIDER_SITE_OTHER): Payer: Medicare Other | Admitting: Internal Medicine

## 2013-11-21 ENCOUNTER — Encounter (INDEPENDENT_AMBULATORY_CARE_PROVIDER_SITE_OTHER): Payer: Self-pay | Admitting: Internal Medicine

## 2013-11-21 VITALS — BP 118/74 | HR 66 | Temp 98.1°F | Resp 18 | Ht 66.0 in | Wt 188.8 lb

## 2013-11-21 DIAGNOSIS — K219 Gastro-esophageal reflux disease without esophagitis: Secondary | ICD-10-CM | POA: Diagnosis not present

## 2013-11-21 NOTE — Progress Notes (Signed)
Presenting complaint;  Followup for GERD.  Subjective:  Patient is 73 year old Caucasian male who has chronic GERD and is in for yearly visit. He states he is doing well. He has lost 14 pounds since his last visit 14 months ago. He states he lost this weight this year when he found out he was prediabetic. He has changed his eating habits. He is not taking pantoprazole on an as-needed basis. He denies dysphagia nausea vomiting abdominal pain melena or rectal bleeding.  Last EGD was in October 2013 revealing mild changes of reflux esophagitis limited to GE junction which was corrected. Biopsy was negative for Barrett's. He also small sliding hernia and pyloric channel inflammation without ulcer. H. pylori serology was negative.       Current Medications: Outpatient Encounter Prescriptions as of 11/21/2013  Medication Sig  . acetaminophen (TYLENOL) 500 MG tablet Take 500 mg by mouth every 8 (eight) hours as needed.  . dabigatran (PRADAXA) 150 MG CAPS capsule Take 1 capsule (150 mg total) by mouth every 12 (twelve) hours.  . indapamide (LOZOL) 1.25 MG tablet Take 1 tablet (1.25 mg total) by mouth daily.  . Oxycodone HCl 10 MG TABS Take 1 tablet (10 mg total) by mouth every 6 (six) hours as needed. For pain  . pantoprazole (PROTONIX) 40 MG tablet Take 1 tablet (40 mg total) by mouth daily.  . pravastatin (PRAVACHOL) 40 MG tablet Take 1 tablet (40 mg total) by mouth daily.  . ramipril (ALTACE) 10 MG capsule Take 1 capsule (10 mg total) by mouth 2 (two) times daily.     Objective: Blood pressure 118/74, pulse 66, temperature 98.1 F (36.7 C), temperature source Oral, resp. rate 18, height 5\' 6"  (1.676 m), weight 188 lb 12.8 oz (85.639 kg). Patient is alert and in no acute distress. Conjunctiva is pink. Sclera is nonicteric Oropharyngeal mucosa is normal. No neck masses or thyromegaly noted. Abdomen is full but soft and nontender without organomegaly or masses. No LE edema or clubbing  noted.   Assessment:  #1. Chronic GERD. Symptom control appears to be satisfactory with dietary changes and when necessary use of PPI. EGD in October 2013 was negative for Barrett's. He therefore can use PPI on demand. #2. History of colonic adenoma and family history of colon carcinoma and one sister. Last colonoscopy was in December 2012.   Plan:  Continue antireflux measures and pantoprazole 40 mg by mouth on an as-needed basis. If symptoms progress patient will call otherwise return for office visit in one year. Next colonoscopy in December, 2017.

## 2013-11-21 NOTE — Patient Instructions (Signed)
Call if medication quirts working

## 2013-11-22 DIAGNOSIS — Z86010 Personal history of colonic polyps: Secondary | ICD-10-CM | POA: Insufficient documentation

## 2013-11-30 DIAGNOSIS — Z981 Arthrodesis status: Secondary | ICD-10-CM | POA: Diagnosis not present

## 2013-11-30 DIAGNOSIS — I10 Essential (primary) hypertension: Secondary | ICD-10-CM | POA: Diagnosis not present

## 2013-11-30 DIAGNOSIS — M479 Spondylosis, unspecified: Secondary | ICD-10-CM | POA: Diagnosis not present

## 2013-11-30 DIAGNOSIS — Z6829 Body mass index (BMI) 29.0-29.9, adult: Secondary | ICD-10-CM | POA: Diagnosis not present

## 2013-12-11 DIAGNOSIS — H4011X0 Primary open-angle glaucoma, stage unspecified: Secondary | ICD-10-CM | POA: Diagnosis not present

## 2013-12-11 DIAGNOSIS — H26499 Other secondary cataract, unspecified eye: Secondary | ICD-10-CM | POA: Diagnosis not present

## 2013-12-11 DIAGNOSIS — H43399 Other vitreous opacities, unspecified eye: Secondary | ICD-10-CM | POA: Diagnosis not present

## 2013-12-11 DIAGNOSIS — H2510 Age-related nuclear cataract, unspecified eye: Secondary | ICD-10-CM | POA: Diagnosis not present

## 2013-12-27 ENCOUNTER — Telehealth: Payer: Self-pay | Admitting: *Deleted

## 2013-12-27 DIAGNOSIS — M479 Spondylosis, unspecified: Secondary | ICD-10-CM | POA: Diagnosis not present

## 2013-12-27 NOTE — Telephone Encounter (Signed)
Patient walked into the office for pradaxa samples. Samples provided.

## 2014-01-25 DIAGNOSIS — Z6828 Body mass index (BMI) 28.0-28.9, adult: Secondary | ICD-10-CM | POA: Diagnosis not present

## 2014-01-25 DIAGNOSIS — I10 Essential (primary) hypertension: Secondary | ICD-10-CM | POA: Diagnosis not present

## 2014-01-25 DIAGNOSIS — M479 Spondylosis, unspecified: Secondary | ICD-10-CM | POA: Diagnosis not present

## 2014-02-05 DIAGNOSIS — T1500XA Foreign body in cornea, unspecified eye, initial encounter: Secondary | ICD-10-CM | POA: Diagnosis not present

## 2014-02-14 DIAGNOSIS — T1590XA Foreign body on external eye, part unspecified, unspecified eye, initial encounter: Secondary | ICD-10-CM | POA: Diagnosis not present

## 2014-02-14 DIAGNOSIS — H179 Unspecified corneal scar and opacity: Secondary | ICD-10-CM | POA: Diagnosis not present

## 2014-02-14 DIAGNOSIS — H18419 Arcus senilis, unspecified eye: Secondary | ICD-10-CM | POA: Diagnosis not present

## 2014-02-28 DIAGNOSIS — H2512 Age-related nuclear cataract, left eye: Secondary | ICD-10-CM | POA: Diagnosis not present

## 2014-03-14 ENCOUNTER — Telehealth: Payer: Self-pay | Admitting: Family Medicine

## 2014-03-14 DIAGNOSIS — R7303 Prediabetes: Secondary | ICD-10-CM

## 2014-03-14 DIAGNOSIS — R5383 Other fatigue: Secondary | ICD-10-CM

## 2014-03-14 DIAGNOSIS — E785 Hyperlipidemia, unspecified: Secondary | ICD-10-CM

## 2014-03-14 DIAGNOSIS — Z79899 Other long term (current) drug therapy: Secondary | ICD-10-CM

## 2014-03-14 DIAGNOSIS — Z125 Encounter for screening for malignant neoplasm of prostate: Secondary | ICD-10-CM

## 2014-03-14 NOTE — Telephone Encounter (Signed)
Last labs 10/10/13. Lipid, liver, bmp, a1c

## 2014-03-14 NOTE — Telephone Encounter (Signed)
Patient needs order for BW. °

## 2014-03-15 NOTE — Telephone Encounter (Signed)
Lipid, liver, metabolic 7, hemoglobin M9P, PSA, CBC

## 2014-03-16 ENCOUNTER — Encounter: Payer: Self-pay | Admitting: Family Medicine

## 2014-03-16 ENCOUNTER — Ambulatory Visit (INDEPENDENT_AMBULATORY_CARE_PROVIDER_SITE_OTHER): Payer: Medicare Other | Admitting: Family Medicine

## 2014-03-16 VITALS — BP 128/82 | Ht 66.0 in | Wt 180.0 lb

## 2014-03-16 DIAGNOSIS — G8929 Other chronic pain: Secondary | ICD-10-CM

## 2014-03-16 DIAGNOSIS — M549 Dorsalgia, unspecified: Secondary | ICD-10-CM

## 2014-03-16 DIAGNOSIS — Z23 Encounter for immunization: Secondary | ICD-10-CM | POA: Diagnosis not present

## 2014-03-16 DIAGNOSIS — Z125 Encounter for screening for malignant neoplasm of prostate: Secondary | ICD-10-CM | POA: Diagnosis not present

## 2014-03-16 DIAGNOSIS — I10 Essential (primary) hypertension: Secondary | ICD-10-CM

## 2014-03-16 DIAGNOSIS — Z79899 Other long term (current) drug therapy: Secondary | ICD-10-CM | POA: Diagnosis not present

## 2014-03-16 DIAGNOSIS — R5383 Other fatigue: Secondary | ICD-10-CM | POA: Diagnosis not present

## 2014-03-16 DIAGNOSIS — E785 Hyperlipidemia, unspecified: Secondary | ICD-10-CM

## 2014-03-16 DIAGNOSIS — R7309 Other abnormal glucose: Secondary | ICD-10-CM | POA: Diagnosis not present

## 2014-03-16 LAB — LIPID PANEL
Cholesterol: 150 mg/dL (ref 0–200)
HDL: 52 mg/dL (ref >39)
LDL Cholesterol: 86 mg/dL (ref 0–99)
Total CHOL/HDL Ratio: 2.9 Ratio
Triglycerides: 59 mg/dL (ref <150)
VLDL: 12 mg/dL (ref 0–40)

## 2014-03-16 LAB — HEPATIC FUNCTION PANEL
ALT: 20 U/L (ref 0–53)
AST: 23 U/L (ref 0–37)
Albumin: 4.3 g/dL (ref 3.5–5.2)
Alkaline Phosphatase: 65 U/L (ref 39–117)
Bilirubin, Direct: 0.1 mg/dL (ref 0.0–0.3)
Indirect Bilirubin: 0.5 mg/dL (ref 0.2–1.2)
Total Bilirubin: 0.6 mg/dL (ref 0.2–1.2)
Total Protein: 6.7 g/dL (ref 6.0–8.3)

## 2014-03-16 LAB — BASIC METABOLIC PANEL
BUN: 20 mg/dL (ref 6–23)
CO2: 28 mEq/L (ref 19–32)
Calcium: 9.1 mg/dL (ref 8.4–10.5)
Chloride: 102 mEq/L (ref 96–112)
Creat: 0.95 mg/dL (ref 0.50–1.35)
Glucose, Bld: 96 mg/dL (ref 70–99)
Potassium: 4.2 mEq/L (ref 3.5–5.3)
Sodium: 136 mEq/L (ref 135–145)

## 2014-03-16 MED ORDER — OXYCODONE HCL 10 MG PO TABS: 10.0000 mg | ORAL_TABLET | Freq: Four times a day (QID) | ORAL | Status: DC | PRN

## 2014-03-16 NOTE — Progress Notes (Signed)
   Subjective:    Patient ID: [name], male    DOB: 01-02-1941, 73 y.o.   MRN: [id number]  Hypertension This is a chronic problem. The current episode started more than 1 year ago. Pertinent negatives include no chest pain. Risk factors for coronary artery disease include dyslipidemia and male gender. Treatments tried: lozol, indapamide. There are no compliance problems.    Patient going to have labs done after his office visit. Chronic pain-patient having severe chronic low back pain uses oxycodone up to 4 times per day as necessary needs a refill denies any drowsiness or side effects of medicine  Patient also with sugar issues he's tried to lose weight through exercise watching his diet. He is getting his lab work checked since  Has hyperlipidemia does not tolerate statins getting lab work later today await results  Review of Systems  Constitutional: Negative for activity change, appetite change and fatigue.  HENT: Negative for congestion.   Respiratory: Negative for cough.   Cardiovascular: Negative for chest pain.  Gastrointestinal: Negative for abdominal pain.  Endocrine: Negative for polydipsia and polyphagia.  Neurological: Negative for weakness.  Psychiatric/Behavioral: Negative for confusion.       Objective:   Physical Exam  Vitals reviewed. Constitutional: He appears well-nourished. No distress.  Cardiovascular: Normal rate, regular rhythm and normal heart sounds.   No murmur heard. Pulmonary/Chest: Effort normal and breath sounds normal. No respiratory distress.  Musculoskeletal: He exhibits no edema.  Lymphadenopathy:    He has no cervical adenopathy.  Neurological: He is alert.  Psychiatric: His behavior is normal.          Assessment & Plan:  Hyperlipidemia-check lab work cannot tolerate statins Hyperglycemia check A1c await results watch diet stay physically active keep weight down HTN good control today continue current measures Chronic pain  refill her medication was given. Follow-up again in 4 months.

## 2014-03-16 NOTE — Telephone Encounter (Signed)
bloodwork orders ready. Pt notified.  

## 2014-03-17 ENCOUNTER — Encounter: Payer: Self-pay | Admitting: Family Medicine

## 2014-03-17 LAB — CBC WITH DIFFERENTIAL/PLATELET
Basophils Absolute: 0 10*3/uL (ref 0.0–0.1)
Basophils Relative: 1 % (ref 0–1)
Eosinophils Absolute: 0.1 10*3/uL (ref 0.0–0.7)
Eosinophils Relative: 3 % (ref 0–5)
HCT: 44 % (ref 39.0–52.0)
Hemoglobin: 14.5 g/dL (ref 13.0–17.0)
Lymphocytes Relative: 34 % (ref 12–46)
Lymphs Abs: 1.5 10*3/uL (ref 0.7–4.0)
MCH: 30.5 pg (ref 26.0–34.0)
MCHC: 33 g/dL (ref 30.0–36.0)
MCV: 92.6 fL (ref 78.0–100.0)
Monocytes Absolute: 0.5 10*3/uL (ref 0.1–1.0)
Monocytes Relative: 11 % (ref 3–12)
Neutro Abs: 2.2 10*3/uL (ref 1.7–7.7)
Neutrophils Relative %: 51 % (ref 43–77)
Platelets: 230 10*3/uL (ref 150–400)
RBC: 4.75 MIL/uL (ref 4.22–5.81)
RDW: 13.3 % (ref 11.5–15.5)
WBC: 4.4 10*3/uL (ref 4.0–10.5)

## 2014-03-17 LAB — PSA, MEDICARE: PSA: 1.9 ng/mL (ref <=4.00)

## 2014-03-17 LAB — HEMOGLOBIN A1C
Hgb A1c MFr Bld: 6.2 % — ABNORMAL HIGH (ref <5.7)
Mean Plasma Glucose: 131 mg/dL — ABNORMAL HIGH (ref <117)

## 2014-03-29 ENCOUNTER — Other Ambulatory Visit: Payer: Self-pay | Admitting: Neurological Surgery

## 2014-03-29 DIAGNOSIS — I10 Essential (primary) hypertension: Secondary | ICD-10-CM | POA: Diagnosis not present

## 2014-03-29 DIAGNOSIS — S32009K Unspecified fracture of unspecified lumbar vertebra, subsequent encounter for fracture with nonunion: Secondary | ICD-10-CM | POA: Diagnosis not present

## 2014-03-29 DIAGNOSIS — Z981 Arthrodesis status: Secondary | ICD-10-CM | POA: Diagnosis not present

## 2014-03-29 DIAGNOSIS — Z6828 Body mass index (BMI) 28.0-28.9, adult: Secondary | ICD-10-CM | POA: Diagnosis not present

## 2014-04-03 ENCOUNTER — Telehealth: Payer: Self-pay | Admitting: Internal Medicine

## 2014-04-04 NOTE — Telephone Encounter (Signed)
Received request from Nurse fax box:   To: [facility] Fax number: [phone]

## 2014-04-16 ENCOUNTER — Other Ambulatory Visit: Payer: Self-pay | Admitting: Family Medicine

## 2014-04-16 MED ORDER — OXYCODONE HCL 10 MG PO TABS: 10.0000 mg | ORAL_TABLET | Freq: Four times a day (QID) | ORAL | Status: DC | PRN

## 2014-04-16 NOTE — Progress Notes (Signed)
Wife requests his scripts. Due for f/u in Feb

## 2014-04-19 ENCOUNTER — Encounter (HOSPITAL_COMMUNITY)
Admission: RE | Admit: 2014-04-19 | Discharge: 2014-04-19 | Disposition: A | Discharge status: Home / Self Care | Payer: Medicare Other | Source: Ambulatory Visit | Attending: Neurological Surgery | Admitting: Neurological Surgery

## 2014-04-19 ENCOUNTER — Ambulatory Visit (HOSPITAL_COMMUNITY)
Admission: RE | Admit: 2014-04-19 | Discharge: 2014-04-19 | Disposition: A | Discharge status: Home / Self Care | Payer: Medicare Other | Source: Ambulatory Visit | Attending: Anesthesiology | Admitting: Anesthesiology

## 2014-04-19 ENCOUNTER — Encounter (HOSPITAL_COMMUNITY): Payer: Self-pay

## 2014-04-19 DIAGNOSIS — Y838 Other surgical procedures as the cause of abnormal reaction of the patient, or of later complication, without mention of misadventure at the time of the procedure: Secondary | ICD-10-CM | POA: Diagnosis not present

## 2014-04-19 DIAGNOSIS — Z882 Allergy status to sulfonamides status: Secondary | ICD-10-CM | POA: Diagnosis not present

## 2014-04-19 DIAGNOSIS — K219 Gastro-esophageal reflux disease without esophagitis: Secondary | ICD-10-CM | POA: Diagnosis not present

## 2014-04-19 DIAGNOSIS — I10 Essential (primary) hypertension: Secondary | ICD-10-CM | POA: Diagnosis not present

## 2014-04-19 DIAGNOSIS — Z01818 Encounter for other preprocedural examination: Secondary | ICD-10-CM | POA: Diagnosis not present

## 2014-04-19 DIAGNOSIS — I480 Paroxysmal atrial fibrillation: Secondary | ICD-10-CM | POA: Diagnosis not present

## 2014-04-19 DIAGNOSIS — Z79899 Other long term (current) drug therapy: Secondary | ICD-10-CM | POA: Diagnosis not present

## 2014-04-19 DIAGNOSIS — R05 Cough: Secondary | ICD-10-CM

## 2014-04-19 DIAGNOSIS — Z981 Arthrodesis status: Secondary | ICD-10-CM | POA: Diagnosis not present

## 2014-04-19 DIAGNOSIS — Z888 Allergy status to other drugs, medicaments and biological substances status: Secondary | ICD-10-CM | POA: Diagnosis not present

## 2014-04-19 DIAGNOSIS — Z79891 Long term (current) use of opiate analgesic: Secondary | ICD-10-CM | POA: Diagnosis not present

## 2014-04-19 DIAGNOSIS — T84216A Breakdown (mechanical) of internal fixation device of vertebrae, initial encounter: Secondary | ICD-10-CM | POA: Diagnosis not present

## 2014-04-19 DIAGNOSIS — R059 Cough, unspecified: Secondary | ICD-10-CM

## 2014-04-19 DIAGNOSIS — Z86718 Personal history of other venous thrombosis and embolism: Secondary | ICD-10-CM | POA: Diagnosis not present

## 2014-04-19 DIAGNOSIS — Y793 Surgical instruments, materials and orthopedic devices (including sutures) associated with adverse incidents: Secondary | ICD-10-CM | POA: Diagnosis not present

## 2014-04-19 DIAGNOSIS — M47896 Other spondylosis, lumbar region: Secondary | ICD-10-CM | POA: Diagnosis not present

## 2014-04-19 DIAGNOSIS — M479 Spondylosis, unspecified: Secondary | ICD-10-CM | POA: Diagnosis not present

## 2014-04-19 DIAGNOSIS — M960 Pseudarthrosis after fusion or arthrodesis: Secondary | ICD-10-CM | POA: Diagnosis not present

## 2014-04-19 DIAGNOSIS — R351 Nocturia: Secondary | ICD-10-CM | POA: Diagnosis not present

## 2014-04-19 DIAGNOSIS — Z8614 Personal history of Methicillin resistant Staphylococcus aureus infection: Secondary | ICD-10-CM | POA: Diagnosis not present

## 2014-04-19 DIAGNOSIS — Z7901 Long term (current) use of anticoagulants: Secondary | ICD-10-CM | POA: Diagnosis not present

## 2014-04-19 DIAGNOSIS — E785 Hyperlipidemia, unspecified: Secondary | ICD-10-CM | POA: Diagnosis not present

## 2014-04-19 DIAGNOSIS — Z86010 Personal history of colonic polyps: Secondary | ICD-10-CM | POA: Diagnosis not present

## 2014-04-19 HISTORY — DX: Pneumonia, unspecified organism: J18.9

## 2014-04-19 HISTORY — DX: Constipation, unspecified: K59.00

## 2014-04-19 HISTORY — DX: Other complications of anesthesia, initial encounter: T88.59XA

## 2014-04-19 HISTORY — DX: Unspecified cataract: H26.9

## 2014-04-19 HISTORY — DX: Urgency of urination: R39.15

## 2014-04-19 HISTORY — DX: Nocturia: R35.1

## 2014-04-19 HISTORY — DX: Personal history of Methicillin resistant Staphylococcus aureus infection: Z86.14

## 2014-04-19 HISTORY — DX: Unspecified osteoarthritis, unspecified site: M19.90

## 2014-04-19 HISTORY — DX: Personal history of colon polyps, unspecified: Z86.0100

## 2014-04-19 HISTORY — DX: Frequency of micturition: R35.0

## 2014-04-19 HISTORY — DX: Pain in unspecified joint: M25.50

## 2014-04-19 HISTORY — DX: Personal history of colonic polyps: Z86.010

## 2014-04-19 HISTORY — DX: Adverse effect of unspecified anesthetic, initial encounter: T41.45XA

## 2014-04-19 HISTORY — DX: Effusion, unspecified joint: M25.40

## 2014-04-19 LAB — SURGICAL PCR SCREEN
MRSA, PCR: NEGATIVE
Staphylococcus aureus: NEGATIVE

## 2014-04-19 LAB — CBC
HCT: 43.1 % (ref 39.0–52.0)
Hemoglobin: 14.5 g/dL (ref 13.0–17.0)
MCH: 31 pg (ref 26.0–34.0)
MCHC: 33.6 g/dL (ref 30.0–36.0)
MCV: 92.3 fL (ref 78.0–100.0)
Platelets: 256 10*3/uL (ref 150–400)
RBC: 4.67 MIL/uL (ref 4.22–5.81)
RDW: 13.3 % (ref 11.5–15.5)
WBC: 5.5 10*3/uL (ref 4.0–10.5)

## 2014-04-19 LAB — BASIC METABOLIC PANEL
Anion gap: 13 (ref 5–15)
BUN: 27 mg/dL — ABNORMAL HIGH (ref 6–23)
CO2: 25 mEq/L (ref 19–32)
Calcium: 9.3 mg/dL (ref 8.4–10.5)
Chloride: 100 mEq/L (ref 96–112)
Creatinine, Ser: 1.18 mg/dL (ref 0.50–1.35)
GFR calc Af Amer: 69 mL/min — ABNORMAL LOW (ref >90)
GFR calc non Af Amer: 59 mL/min — ABNORMAL LOW (ref >90)
Glucose, Bld: 97 mg/dL (ref 70–99)
Potassium: 4.6 mEq/L (ref 3.7–5.3)
Sodium: 138 mEq/L (ref 137–147)

## 2014-04-19 NOTE — Progress Notes (Addendum)
Medical MD is Dr.Scott Luking  Multiple echo reports in epic with most recent in epic from 2014  Heart cath in epic from 2008  EKG in epic from 06-29-13  Cardiologist is Dr.Allred with last visit from 06-2013 in epic and will see again Feb 2016  Stress test done 2008   Denies CXR in past yr

## 2014-04-19 NOTE — Pre-Procedure Instructions (Signed)
[name]  04/19/2014   Your procedure is scheduled on:  Tues, Dec 8 @ 11:35 AM  Report to [facility]  at 8:30 AM.  Call this number if you have problems the morning of surgery: [phone]   Remember:   Do not eat food or drink liquids after midnight.   Take these medicines the morning of surgery with A SIP OF WATER: Pain Pill(if needed) and Protonix(Pantoprazole)               Stop taking your Pradaxa as you have been instructed. No Goody's,BC's,Aleve,Aspirin,Ibuprofen,Fish Oil,or any Herbal Medications   Do not wear jewelry  Do not wear lotions, powders, or colognes. You may wear deodorant.  Men may shave face and neck.  Do not bring valuables to the hospital.  [facility] is not responsible                  for any belongings or valuables.               Contacts, dentures or bridgework may not be worn into surgery.  Leave suitcase in the car. After surgery it may be brought to your room.  For patients admitted to the hospital, discharge time is determined by your                treatment team.               Patients discharged the day of surgery will not be allowed to drive  home.    Special Instructions:  [facility] - Preparing for Surgery  Before surgery, you can play an important role.  Because skin is not sterile, your skin needs to be as free of germs as possible.  You can reduce the number of germs on you skin by washing with CHG (chlorahexidine gluconate) soap before surgery.  CHG is an antiseptic cleaner which kills germs and bonds with the skin to continue killing germs even after washing.  Please DO NOT use if you have an allergy to CHG or antibacterial soaps.  If your skin becomes reddened/irritated stop using the CHG and inform your nurse when you arrive at Short Stay.  Do not shave (including legs and underarms) for at least 48 hours prior to the first CHG shower.  You may shave your face.  Please follow these instructions carefully:   1.  Shower  with CHG Soap the night before surgery and the                                morning of Surgery.  2.  If you choose to wash your hair, wash your hair first as usual with your       normal shampoo.  3.  After you shampoo, rinse your hair and body thoroughly to remove the                      Shampoo.  4.  Use CHG as you would any other liquid soap.  You can apply chg directly       to the skin and wash gently with scrungie or a clean washcloth.  5.  Apply the CHG Soap to your body ONLY FROM THE NECK DOWN.        Do not use on open wounds or open sores.  Avoid contact with your eyes,       ears,  mouth and genitals (private parts).  Wash genitals (private parts)       with your normal soap.  6.  Wash thoroughly, paying special attention to the area where your surgery        will be performed.  7.  Thoroughly rinse your body with warm water from the neck down.  8.  DO NOT shower/wash with your normal soap after using and rinsing off       the CHG Soap.  9.  Pat yourself dry with a clean towel.            10.  Wear clean pajamas.            11.  Place clean sheets on your bed the night of your first shower and do not        sleep with pets.  Day of Surgery  Do not apply any lotions/deoderants the morning of surgery.  Please wear clean clothes to the hospital/surgery center.     Please read over the following fact sheets that you were given: Pain Booklet, Coughing and Deep Breathing, MRSA Information and Surgical Site Infection Prevention

## 2014-04-23 MED ORDER — CEFAZOLIN SODIUM-DEXTROSE 2-3 GM-% IV SOLR
2.0000 g | INTRAVENOUS | Status: AC
  Administered 2014-04-24: 2 g via INTRAVENOUS
  Filled 2014-04-23: qty 50

## 2014-04-24 ENCOUNTER — Inpatient Hospital Stay (HOSPITAL_COMMUNITY): Payer: Medicare Other | Admitting: Anesthesiology

## 2014-04-24 ENCOUNTER — Encounter (HOSPITAL_COMMUNITY): Payer: Self-pay | Admitting: Anesthesiology

## 2014-04-24 ENCOUNTER — Inpatient Hospital Stay (HOSPITAL_COMMUNITY): Payer: Medicare Other

## 2014-04-24 ENCOUNTER — Encounter (HOSPITAL_COMMUNITY)
Admission: RE | Disposition: A | Discharge status: Home / Self Care | Payer: Self-pay | Source: Ambulatory Visit | Attending: Neurological Surgery

## 2014-04-24 ENCOUNTER — Inpatient Hospital Stay (HOSPITAL_COMMUNITY): Payer: Medicare Other | Admitting: Vascular Surgery

## 2014-04-24 ENCOUNTER — Ambulatory Visit (HOSPITAL_COMMUNITY)
Admission: RE | Admit: 2014-04-24 | Discharge: 2014-04-24 | Disposition: A | Discharge status: Home / Self Care | Payer: Medicare Other | Source: Ambulatory Visit | Attending: Neurological Surgery | Admitting: Neurological Surgery

## 2014-04-24 DIAGNOSIS — M47896 Other spondylosis, lumbar region: Secondary | ICD-10-CM | POA: Insufficient documentation

## 2014-04-24 DIAGNOSIS — E785 Hyperlipidemia, unspecified: Secondary | ICD-10-CM | POA: Insufficient documentation

## 2014-04-24 DIAGNOSIS — I480 Paroxysmal atrial fibrillation: Secondary | ICD-10-CM | POA: Diagnosis not present

## 2014-04-24 DIAGNOSIS — Y793 Surgical instruments, materials and orthopedic devices (including sutures) associated with adverse incidents: Secondary | ICD-10-CM | POA: Insufficient documentation

## 2014-04-24 DIAGNOSIS — S32009K Unspecified fracture of unspecified lumbar vertebra, subsequent encounter for fracture with nonunion: Secondary | ICD-10-CM

## 2014-04-24 DIAGNOSIS — Z79891 Long term (current) use of opiate analgesic: Secondary | ICD-10-CM | POA: Insufficient documentation

## 2014-04-24 DIAGNOSIS — M1990 Unspecified osteoarthritis, unspecified site: Secondary | ICD-10-CM | POA: Diagnosis not present

## 2014-04-24 DIAGNOSIS — Z7901 Long term (current) use of anticoagulants: Secondary | ICD-10-CM | POA: Insufficient documentation

## 2014-04-24 DIAGNOSIS — R351 Nocturia: Secondary | ICD-10-CM | POA: Insufficient documentation

## 2014-04-24 DIAGNOSIS — M479 Spondylosis, unspecified: Secondary | ICD-10-CM | POA: Insufficient documentation

## 2014-04-24 DIAGNOSIS — Y838 Other surgical procedures as the cause of abnormal reaction of the patient, or of later complication, without mention of misadventure at the time of the procedure: Secondary | ICD-10-CM | POA: Insufficient documentation

## 2014-04-24 DIAGNOSIS — I10 Essential (primary) hypertension: Secondary | ICD-10-CM | POA: Insufficient documentation

## 2014-04-24 DIAGNOSIS — K219 Gastro-esophageal reflux disease without esophagitis: Secondary | ICD-10-CM | POA: Insufficient documentation

## 2014-04-24 DIAGNOSIS — Z888 Allergy status to other drugs, medicaments and biological substances status: Secondary | ICD-10-CM | POA: Insufficient documentation

## 2014-04-24 DIAGNOSIS — Z882 Allergy status to sulfonamides status: Secondary | ICD-10-CM | POA: Insufficient documentation

## 2014-04-24 DIAGNOSIS — M960 Pseudarthrosis after fusion or arthrodesis: Principal | ICD-10-CM | POA: Insufficient documentation

## 2014-04-24 DIAGNOSIS — Z981 Arthrodesis status: Secondary | ICD-10-CM | POA: Insufficient documentation

## 2014-04-24 DIAGNOSIS — Z79899 Other long term (current) drug therapy: Secondary | ICD-10-CM | POA: Insufficient documentation

## 2014-04-24 DIAGNOSIS — Z4789 Encounter for other orthopedic aftercare: Secondary | ICD-10-CM | POA: Diagnosis not present

## 2014-04-24 DIAGNOSIS — T84216A Breakdown (mechanical) of internal fixation device of vertebrae, initial encounter: Secondary | ICD-10-CM | POA: Insufficient documentation

## 2014-04-24 DIAGNOSIS — Z8614 Personal history of Methicillin resistant Staphylococcus aureus infection: Secondary | ICD-10-CM | POA: Insufficient documentation

## 2014-04-24 DIAGNOSIS — Z86010 Personal history of colonic polyps: Secondary | ICD-10-CM | POA: Insufficient documentation

## 2014-04-24 DIAGNOSIS — Z86718 Personal history of other venous thrombosis and embolism: Secondary | ICD-10-CM | POA: Insufficient documentation

## 2014-04-24 HISTORY — PX: HARDWARE REMOVAL: SHX979

## 2014-04-24 SURGERY — REMOVAL, HARDWARE
Anesthesia: General | Site: Spine Lumbar

## 2014-04-24 MED ORDER — SODIUM CHLORIDE 0.9 % IJ SOLN
INTRAMUSCULAR | Status: AC
Start: 2014-04-24 — End: 2014-04-24
  Filled 2014-04-24: qty 10

## 2014-04-24 MED ORDER — MIDAZOLAM HCL 2 MG/2ML IJ SOLN
INTRAMUSCULAR | Status: AC
  Filled 2014-04-24: qty 2

## 2014-04-24 MED ORDER — FENTANYL CITRATE 0.05 MG/ML IJ SOLN
INTRAMUSCULAR | Status: DC | PRN
  Administered 2014-04-24: 100 ug via INTRAVENOUS
  Administered 2014-04-24: 50 ug via INTRAVENOUS

## 2014-04-24 MED ORDER — PANTOPRAZOLE SODIUM 40 MG PO TBEC: 40.0000 mg | DELAYED_RELEASE_TABLET | Freq: Every day | ORAL | Status: DC | PRN

## 2014-04-24 MED ORDER — BUPIVACAINE HCL (PF) 0.5 % IJ SOLN
INTRAMUSCULAR | Status: DC | PRN
  Administered 2014-04-24: 25 mL
  Administered 2014-04-24: 5 mL

## 2014-04-24 MED ORDER — PHENOL 1.4 % MT LIQD: 1.0000 | OROMUCOSAL | Status: DC | PRN

## 2014-04-24 MED ORDER — INDAPAMIDE 1.25 MG PO TABS
1.2500 mg | ORAL_TABLET | Freq: Every day | ORAL | Status: DC
  Filled 2014-04-24: qty 1

## 2014-04-24 MED ORDER — 0.9 % SODIUM CHLORIDE (POUR BTL) OPTIME
TOPICAL | Status: DC | PRN
  Administered 2014-04-24: 1000 mL

## 2014-04-24 MED ORDER — OXYCODONE HCL 5 MG PO TABS: 10.0000 mg | ORAL_TABLET | Freq: Four times a day (QID) | ORAL | Status: DC | PRN

## 2014-04-24 MED ORDER — LIDOCAINE HCL (CARDIAC) 20 MG/ML IV SOLN
INTRAVENOUS | Status: AC
  Filled 2014-04-24: qty 5

## 2014-04-24 MED ORDER — HYDROMORPHONE HCL 1 MG/ML IJ SOLN: 0.2500 mg | INTRAMUSCULAR | Status: DC | PRN

## 2014-04-24 MED ORDER — METHOCARBAMOL 500 MG PO TABS: 500.0000 mg | ORAL_TABLET | Freq: Four times a day (QID) | ORAL | Status: DC | PRN

## 2014-04-24 MED ORDER — DEXTROSE 5 % IV SOLN: 500.0000 mg | Freq: Four times a day (QID) | INTRAVENOUS | Status: DC | PRN

## 2014-04-24 MED ORDER — ACETAMINOPHEN 325 MG PO TABS
650.0000 mg | ORAL_TABLET | ORAL | Status: DC | PRN
  Administered 2014-04-24: 650 mg via ORAL
  Filled 2014-04-24: qty 2

## 2014-04-24 MED ORDER — DEXAMETHASONE SODIUM PHOSPHATE 10 MG/ML IJ SOLN
INTRAMUSCULAR | Status: AC
  Filled 2014-04-24: qty 1

## 2014-04-24 MED ORDER — MENTHOL 3 MG MT LOZG: 1.0000 | LOZENGE | OROMUCOSAL | Status: DC | PRN

## 2014-04-24 MED ORDER — LIDOCAINE-EPINEPHRINE 1 %-1:100000 IJ SOLN
INTRAMUSCULAR | Status: DC | PRN
Start: 2014-04-24 — End: 2014-04-24
  Administered 2014-04-24: 5 mL

## 2014-04-24 MED ORDER — PRAVASTATIN SODIUM 40 MG PO TABS
40.0000 mg | ORAL_TABLET | Freq: Every day | ORAL | Status: DC
  Filled 2014-04-24: qty 1

## 2014-04-24 MED ORDER — GLYCOPYRROLATE 0.2 MG/ML IJ SOLN
INTRAMUSCULAR | Status: DC | PRN
  Administered 2014-04-24: .5 mg via INTRAVENOUS

## 2014-04-24 MED ORDER — SODIUM CHLORIDE 0.9 % IJ SOLN
3.0000 mL | Freq: Two times a day (BID) | INTRAMUSCULAR | Status: DC
  Administered 2014-04-24: 3 mL via INTRAVENOUS

## 2014-04-24 MED ORDER — ONDANSETRON HCL 4 MG/2ML IJ SOLN
INTRAMUSCULAR | Status: DC | PRN
  Administered 2014-04-24: 4 mg via INTRAVENOUS

## 2014-04-24 MED ORDER — PHENYLEPHRINE 40 MCG/ML (10ML) SYRINGE FOR IV PUSH (FOR BLOOD PRESSURE SUPPORT)
PREFILLED_SYRINGE | INTRAVENOUS | Status: AC
  Filled 2014-04-24: qty 10

## 2014-04-24 MED ORDER — NEOSTIGMINE METHYLSULFATE 10 MG/10ML IV SOLN
INTRAVENOUS | Status: AC
  Filled 2014-04-24: qty 1

## 2014-04-24 MED ORDER — FENTANYL CITRATE 0.05 MG/ML IJ SOLN
INTRAMUSCULAR | Status: AC
  Filled 2014-04-24: qty 5

## 2014-04-24 MED ORDER — HEMOSTATIC AGENTS (NO CHARGE) OPTIME
TOPICAL | Status: DC | PRN
  Administered 2014-04-24: 1 via TOPICAL

## 2014-04-24 MED ORDER — ACETAMINOPHEN 650 MG RE SUPP: 650.0000 mg | RECTAL | Status: DC | PRN

## 2014-04-24 MED ORDER — LACTATED RINGERS IV SOLN
INTRAVENOUS | Status: DC | PRN
  Administered 2014-04-24 (×2): via INTRAVENOUS

## 2014-04-24 MED ORDER — ARTIFICIAL TEARS OP OINT
TOPICAL_OINTMENT | OPHTHALMIC | Status: DC | PRN
  Administered 2014-04-24: 1 via OPHTHALMIC

## 2014-04-24 MED ORDER — PROPOFOL 10 MG/ML IV BOLUS
INTRAVENOUS | Status: DC | PRN
  Administered 2014-04-24: 190 mg via INTRAVENOUS

## 2014-04-24 MED ORDER — DEXAMETHASONE SODIUM PHOSPHATE 10 MG/ML IJ SOLN
INTRAMUSCULAR | Status: DC | PRN
  Administered 2014-04-24: 10 mg via INTRAVENOUS

## 2014-04-24 MED ORDER — RAMIPRIL 10 MG PO CAPS
10.0000 mg | ORAL_CAPSULE | Freq: Two times a day (BID) | ORAL | Status: DC
  Administered 2014-04-24: 10 mg via ORAL
  Filled 2014-04-24: qty 1

## 2014-04-24 MED ORDER — OXYCODONE-ACETAMINOPHEN 5-325 MG PO TABS: 1.0000 | ORAL_TABLET | ORAL | Status: DC | PRN

## 2014-04-24 MED ORDER — GLYCOPYRROLATE 0.2 MG/ML IJ SOLN
INTRAMUSCULAR | Status: AC
  Filled 2014-04-24: qty 3

## 2014-04-24 MED ORDER — LACTATED RINGERS IV SOLN
INTRAVENOUS | Status: DC
  Administered 2014-04-24: 10:00:00 via INTRAVENOUS

## 2014-04-24 MED ORDER — ROCURONIUM BROMIDE 100 MG/10ML IV SOLN
INTRAVENOUS | Status: DC | PRN
  Administered 2014-04-24: 10 mg via INTRAVENOUS
  Administered 2014-04-24: 20 mg via INTRAVENOUS

## 2014-04-24 MED ORDER — SUCCINYLCHOLINE CHLORIDE 20 MG/ML IJ SOLN
INTRAMUSCULAR | Status: AC
  Filled 2014-04-24: qty 1

## 2014-04-24 MED ORDER — ROCURONIUM BROMIDE 50 MG/5ML IV SOLN
INTRAVENOUS | Status: AC
  Filled 2014-04-24: qty 1

## 2014-04-24 MED ORDER — ONDANSETRON HCL 4 MG/2ML IJ SOLN
INTRAMUSCULAR | Status: AC
  Filled 2014-04-24: qty 2

## 2014-04-24 MED ORDER — GLYCOPYRROLATE 0.2 MG/ML IJ SOLN
INTRAMUSCULAR | Status: AC
  Filled 2014-04-24: qty 1

## 2014-04-24 MED ORDER — ALUM & MAG HYDROXIDE-SIMETH 200-200-20 MG/5ML PO SUSP: 30.0000 mL | Freq: Four times a day (QID) | ORAL | Status: DC | PRN

## 2014-04-24 MED ORDER — NEOSTIGMINE METHYLSULFATE 10 MG/10ML IV SOLN
INTRAVENOUS | Status: DC | PRN
  Administered 2014-04-24: 4 mg via INTRAVENOUS

## 2014-04-24 MED ORDER — EPHEDRINE SULFATE 50 MG/ML IJ SOLN
INTRAMUSCULAR | Status: AC
  Filled 2014-04-24: qty 1

## 2014-04-24 MED ORDER — DIAZEPAM 5 MG PO TABS: 5.0000 mg | ORAL_TABLET | Freq: Four times a day (QID) | ORAL | Status: DC | PRN

## 2014-04-24 MED ORDER — OXYCODONE HCL 10 MG PO TABS: 10.0000 mg | ORAL_TABLET | Freq: Four times a day (QID) | ORAL | Status: DC | PRN

## 2014-04-24 MED ORDER — SODIUM CHLORIDE 0.9 % IJ SOLN: 3.0000 mL | INTRAMUSCULAR | Status: DC | PRN

## 2014-04-24 MED ORDER — THROMBIN 5000 UNITS EX SOLR
CUTANEOUS | Status: DC | PRN
  Administered 2014-04-24 (×2): 5000 [IU] via TOPICAL

## 2014-04-24 MED ORDER — SODIUM CHLORIDE 0.9 % IR SOLN
Status: DC | PRN
  Administered 2014-04-24: 500 mL

## 2014-04-24 MED ORDER — MORPHINE SULFATE 2 MG/ML IJ SOLN: 1.0000 mg | INTRAMUSCULAR | Status: DC | PRN

## 2014-04-24 MED ORDER — ARTIFICIAL TEARS OP OINT
TOPICAL_OINTMENT | OPHTHALMIC | Status: AC
  Filled 2014-04-24: qty 3.5

## 2014-04-24 MED ORDER — ONDANSETRON HCL 4 MG/2ML IJ SOLN: 4.0000 mg | INTRAMUSCULAR | Status: DC | PRN

## 2014-04-24 MED ORDER — PROPOFOL 10 MG/ML IV BOLUS
INTRAVENOUS | Status: AC
  Filled 2014-04-24: qty 20

## 2014-04-24 MED ORDER — KETOROLAC TROMETHAMINE 15 MG/ML IJ SOLN
INTRAMUSCULAR | Status: DC | PRN
  Administered 2014-04-24: 15 mg via INTRAVENOUS

## 2014-04-24 MED ORDER — SUCCINYLCHOLINE CHLORIDE 20 MG/ML IJ SOLN
INTRAMUSCULAR | Status: DC | PRN
  Administered 2014-04-24: 120 mg via INTRAVENOUS

## 2014-04-24 MED ORDER — MIDAZOLAM HCL 5 MG/5ML IJ SOLN
INTRAMUSCULAR | Status: DC | PRN
  Administered 2014-04-24: 1 mg via INTRAVENOUS

## 2014-04-24 MED ORDER — CEFAZOLIN SODIUM 1-5 GM-% IV SOLN
1.0000 g | Freq: Three times a day (TID) | INTRAVENOUS | Status: DC
  Administered 2014-04-24: 1 g via INTRAVENOUS
  Filled 2014-04-24 (×2): qty 50

## 2014-04-24 MED ORDER — LIDOCAINE HCL (CARDIAC) 20 MG/ML IV SOLN
INTRAVENOUS | Status: DC | PRN
  Administered 2014-04-24: 40 mg via INTRAVENOUS

## 2014-04-24 SURGICAL SUPPLY — 61 items
3.2MMX18.3MM HELIOCOIDAL RASP LONG ×2 IMPLANT
APL SKNCLS STERI-STRIP NONHPOA (GAUZE/BANDAGES/DRESSINGS)
BAG DECANTER FOR FLEXI CONT (MISCELLANEOUS) ×2 IMPLANT
BENZOIN TINCTURE PRP APPL 2/3 (GAUZE/BANDAGES/DRESSINGS) IMPLANT
BLADE CLIPPER SURG (BLADE) ×1 IMPLANT
BLADE SURG 10 STRL SS (BLADE) ×2 IMPLANT
BUR 2.5 MTCH HD 16 (BUR) ×1 IMPLANT
BUR MATCHSTICK NEURO 3.0X3.8 (BURR) ×1 IMPLANT
CANISTER SUCT 3000ML (MISCELLANEOUS) ×2 IMPLANT
CONT SPEC 4OZ CLIKSEAL STRL BL (MISCELLANEOUS) ×2 IMPLANT
CORDS BIPOLAR (ELECTRODE) ×1 IMPLANT
DECANTER SPIKE VIAL GLASS SM (MISCELLANEOUS) ×2 IMPLANT
DRAPE LAPAROTOMY 100X72 PEDS (DRAPES) ×1 IMPLANT
DRAPE LAPAROTOMY 100X72X124 (DRAPES) IMPLANT
DRAPE POUCH INSTRU U-SHP 10X18 (DRAPES) ×2 IMPLANT
DRSG TELFA 3X8 NADH (GAUZE/BANDAGES/DRESSINGS) IMPLANT
DURAPREP 26ML APPLICATOR (WOUND CARE) ×2 IMPLANT
ELECT BLADE 6.5 EXT (BLADE) ×1 IMPLANT
ELECT REM PT RETURN 9FT ADLT (ELECTROSURGICAL) ×2
ELECTRODE REM PT RTRN 9FT ADLT (ELECTROSURGICAL) ×1 IMPLANT
GAUZE SPONGE 4X4 12PLY STRL (GAUZE/BANDAGES/DRESSINGS) ×1 IMPLANT
GAUZE SPONGE 4X4 16PLY XRAY LF (GAUZE/BANDAGES/DRESSINGS) IMPLANT
GLOVE BIOGEL PI IND STRL 7.5 (GLOVE) IMPLANT
GLOVE BIOGEL PI IND STRL 8.5 (GLOVE) ×1 IMPLANT
GLOVE BIOGEL PI INDICATOR 7.5 (GLOVE) ×2
GLOVE BIOGEL PI INDICATOR 8.5 (GLOVE) ×1
GLOVE ECLIPSE 8.5 STRL (GLOVE) ×2 IMPLANT
GLOVE EXAM NITRILE LRG STRL (GLOVE) IMPLANT
GLOVE EXAM NITRILE MD LF STRL (GLOVE) IMPLANT
GLOVE EXAM NITRILE XL STR (GLOVE) IMPLANT
GLOVE EXAM NITRILE XS STR PU (GLOVE) IMPLANT
GLOVE SURG SS PI 7.5 STRL IVOR (GLOVE) ×1 IMPLANT
GOWN STRL REUS W/ TWL LRG LVL3 (GOWN DISPOSABLE) ×1 IMPLANT
GOWN STRL REUS W/ TWL XL LVL3 (GOWN DISPOSABLE) ×1 IMPLANT
GOWN STRL REUS W/TWL 2XL LVL3 (GOWN DISPOSABLE) ×2 IMPLANT
GOWN STRL REUS W/TWL LRG LVL3 (GOWN DISPOSABLE)
GOWN STRL REUS W/TWL XL LVL3 (GOWN DISPOSABLE) ×2
KIT BASIN OR (CUSTOM PROCEDURE TRAY) ×2 IMPLANT
KIT ROOM TURNOVER OR (KITS) ×2 IMPLANT
LIQUID BAND (GAUZE/BANDAGES/DRESSINGS) ×1 IMPLANT
MARKER SKIN DUAL TIP RULER LAB (MISCELLANEOUS) ×1 IMPLANT
NEEDLE HYPO 22GX1.5 SAFETY (NEEDLE) ×2 IMPLANT
NS IRRIG 1000ML POUR BTL (IV SOLUTION) ×2 IMPLANT
PACK LAMINECTOMY NEURO (CUSTOM PROCEDURE TRAY) ×2 IMPLANT
PAD ARMBOARD 7.5X6 YLW CONV (MISCELLANEOUS) ×6 IMPLANT
PAD DRESSING TELFA 3X8 NADH (GAUZE/BANDAGES/DRESSINGS) ×1 IMPLANT
SPONGE LAP 4X18 X RAY DECT (DISPOSABLE) IMPLANT
SPONGE SURGIFOAM ABS GEL SZ50 (HEMOSTASIS) ×2 IMPLANT
STAPLER SKIN PROX WIDE 3.9 (STAPLE) IMPLANT
STRIP CLOSURE SKIN 1/2X4 (GAUZE/BANDAGES/DRESSINGS) IMPLANT
SUT VIC AB 0 CT1 18XCR BRD8 (SUTURE) ×1 IMPLANT
SUT VIC AB 0 CT1 8-18 (SUTURE) ×2
SUT VIC AB 2-0 CP2 18 (SUTURE) ×2 IMPLANT
SUT VIC AB 3-0 SH 8-18 (SUTURE) ×4 IMPLANT
SWAB COLLECTION DEVICE MRSA (MISCELLANEOUS) IMPLANT
SYR 20ML ECCENTRIC (SYRINGE) ×2 IMPLANT
SYR CONTROL 10ML LL (SYRINGE) ×1 IMPLANT
TOWEL OR 17X24 6PK STRL BLUE (TOWEL DISPOSABLE) ×2 IMPLANT
TOWEL OR 17X26 10 PK STRL BLUE (TOWEL DISPOSABLE) ×2 IMPLANT
TUBE ANAEROBIC SPECIMEN COL (MISCELLANEOUS) IMPLANT
WATER STERILE IRR 1000ML POUR (IV SOLUTION) ×2 IMPLANT

## 2014-04-24 NOTE — Transfer of Care (Signed)
Immediate Anesthesia Transfer of Care Note  Patient: [name]  Procedure(s) Performed: Procedure(s): REMOVAL OF SACRAL INSTRUMENTATION WITH [place] (N/A)  Patient Location: PACU  Anesthesia Type:General  Level of Consciousness: awake, alert , oriented and patient cooperative  Airway & Oxygen Therapy: Patient Spontanous Breathing and Patient connected to nasal cannula oxygen  Post-op Assessment: Report given to PACU RN and Post -op Vital signs reviewed and stable  Post vital signs: Reviewed  Complications: No apparent anesthesia complications

## 2014-04-24 NOTE — Progress Notes (Signed)
PT. UP AD LIB, TOLERATING DIET AND PO MEDICATION, AND VOIDING WITHOUT DIFFICULTIES.  V/S STABLE AND PT. AND FAMILY VERBALIZING UNDERSTANDING OF DISCHARGE ORDERS.  PT. NOW BEING D/C TO HOME WITH STAFF AND FAMILY ASSISTING PT TO CAR.  Starr RN

## 2014-04-24 NOTE — Anesthesia Procedure Notes (Signed)
Procedure Name: Intubation Date/Time: 04/24/2014 11:28 AM Performed by: Jenne Campus Pre-anesthesia Checklist: Patient identified, Emergency Drugs available, Suction available, Patient being monitored and Timeout performed Patient Re-evaluated:Patient Re-evaluated prior to inductionOxygen Delivery Method: Circle system utilized Preoxygenation: Pre-oxygenation with 100% oxygen Intubation Type: IV induction Ventilation: Mask ventilation without difficulty and Oral airway inserted - appropriate to patient size Grade View: Grade I Tube type: Oral Tube size: 7.5 mm Number of attempts: 1 Airway Equipment and Method: Stylet and Video-laryngoscopy Placement Confirmation: positive ETCO2,  CO2 detector and breath sounds checked- equal and bilateral Secured at: 21 cm Tube secured with: Tape Dental Injury: Teeth and Oropharynx as per pre-operative assessment  Difficulty Due To: Difficulty was anticipated, Difficult Airway- due to limited oral opening and Difficult Airway- due to reduced neck mobility Comments: Elective video-glide. Patient with neck hardware from previous neck surgery. Minimal neck mobility. Permanent upper right bridge loose. Small oral aperture .Smooth IV induction. EZ mask with oral airway. DL x 1 video-glide. Grade I view. Atraum OI. +ETCO2. BBSe.

## 2014-04-24 NOTE — Plan of Care (Signed)
Problem: Consults Goal: Spinal Surgery Patient Education See Patient Education Module for education specifics. Outcome: Completed/Met Date Met:  04/24/14     

## 2014-04-24 NOTE — Anesthesia Postprocedure Evaluation (Signed)
  Anesthesia Post-op Note  Patient: [name]  Procedure(s) Performed: Procedure(s): REMOVAL OF SACRAL INSTRUMENTATION WITH METREX (N/A)  Patient Location: PACU  Anesthesia Type:General  Level of Consciousness: awake  Airway and Oxygen Therapy: Patient Spontanous Breathing  Post-op Pain: mild  Post-op Assessment: Post-op Vital signs reviewed  Post-op Vital Signs: Reviewed  Last Vitals:  Filed Vitals:   04/24/14 1725  BP: 159/68  Pulse: 74  Temp: 36.8 C  Resp: 20    Complications: No apparent anesthesia complications

## 2014-04-24 NOTE — Op Note (Signed)
Date of surgery: 04/24/2014 Preoperative diagnosis: Pseudoarthrosis L5-S1 with broken hardware and loose screw in S1. Postoperative diagnosis: Pseudoarthrosis L5-S1 with broken hardware and loose screw in S1 Procedure: Removal of fixation from L2 to sacrum fusion with removal of S1 screws and transverse rod connectors using Metrix retractor. Surgeon: Kristeen Miss Anesthesia: Gen. endotracheal Indications: The patient is a 73 year old individual who had undergone fixation and fusion from L5 to the sacrum. Unfortunately is developed a pseudoarthrosis with loose hardware and broken screw in S1. Is advised regarding the need for removal of the hardware to see if this will lessen the degree of pain that he is experiencing in his low back.  Procedure: The patient was brought to the operating room supine on a stretcher. After the smooth induction of general endotracheal anesthesia, he was turned prone. Back was prepped with alcohol DuraPrep. Fluoroscopic guidance was then used to localize the AP projection the entry sites of the screws at S1 and the transverse connectors used to attach to a previously placed fusion from L2-L5. Then the skin was infiltrated with 10 mL of lidocaine 1% mixed 50-50 with half percent Marcaine 1-100,000 epinephrine.  Using lateral fluoroscopic guidance then the K wire was passed to the top of the hardware in the first sacral screw on the left side. A series of dilators was used after small incision was made in this region and is was dilated to 22 mm diameter. This grew Was identified with cautery being used to separate the soft tissues above the screw. The locking screw was removed. Then through a separate incision transverse connector screws were removed. The rod was loosened and was removed through the inferior opening over the S1 screw. Then the sacral screw was removed. This was the broken screw and only superior portion of the screw was removed. Then an 18 mm diameter dilator was  used over the channel of the screw to allow placement ultimately of a 22 mm 2 in the direction of the screw. Through this aperture the soft tissue was cleared. A high-speed drill with a 2 mm matchstick was then used to enlarge the hole down the broken screw shaft. The outer area of the screw head was then enlarged with a met take bur. An extraction tool was then used to extract broken study of the screw. Attention was then turned to the right-sided hardware were the same procedure was repeated. Here there was no broken screw in the sacrum. The screw was removed without too much difficulty. The rod was removed also however the transverse connector was found to have a set screw that was stripped and this remained clean to the original hardware. This piece was left intact. The wounds were irrigated with antibiotic irrigating solution. An additional 20 mL of half percent Marcaine was injected into these areas. The fascia was then closed with 2-0 Vicryl then the subcuticular tissue was closed with 3-0 Vicryl. Dermabond was placed on the skin. The patient tolerated the procedure well.

## 2014-04-24 NOTE — Anesthesia Preprocedure Evaluation (Addendum)
Anesthesia Evaluation  Patient identified by MRN, date of birth, ID band Patient awake    Reviewed: Allergy & Precautions, H&P , NPO status , Patient's Chart, lab work & pertinent test results  History of Anesthesia Complications Negative for: history of anesthetic complications  Airway Mallampati: III  TM Distance: >3 FB Neck ROM: Limited    Dental  (+) Teeth Intact, Caps, Dental Advisory Given,    Pulmonary pneumonia -,  breath sounds clear to auscultation        Cardiovascular hypertension, Pt. on medications + dysrhythmias (Afib ablation ~4 yrs ago) Rhythm:Regular Rate:Normal     Neuro/Psych    GI/Hepatic Neg liver ROS, GERD-  Medicated and Controlled,  Endo/Other  negative endocrine ROS  Renal/GU negative Renal ROS     Musculoskeletal   Abdominal   Peds  Hematology   Anesthesia Other Findings   Reproductive/Obstetrics negative OB ROS                           Anesthesia Physical Anesthesia Plan  ASA: III  Anesthesia Plan: General   Post-op Pain Management:    Induction: Intravenous  Airway Management Planned: Oral ETT  Additional Equipment:   Intra-op Plan:   Post-operative Plan: Extubation in OR  Informed Consent: I have reviewed the patients History and Physical, chart, labs and discussed the procedure including the risks, benefits and alternatives for the proposed anesthesia with the patient or authorized representative who has indicated his/her understanding and acceptance.     Plan Discussed with: CRNA, Anesthesiologist and Surgeon  Anesthesia Plan Comments:         Anesthesia Quick Evaluation

## 2014-04-24 NOTE — H&P (Signed)
[name] is an 73 y.o. male.   Chief Complaint: Intractable back pain HPI: [name] is a 73 year old individual who over the years has had a number of problems with spondylosis in the lower lumbar spine. He ultimately had undergone fusion at various stages including L3-4 and L4-5 then L2-3 then ultimately L5-S1. It seems that the lowest level of fusion was augmented by posterior supplemental fixation however he is failed diffuse at the L5-S1 level. He has evidence of loosening of the S1 screws. His been advised of the screws be removed to see if this lessens the severity of his localized back pain. His motor function lower extremities has been intact. [name] also has extensive cervical spondylosis with a fusion from C3 down to T1.  Past Medical History  Diagnosis Date  . Paroxysmal atrial fibrillation     takes Pradaxa daily  . History of hyperglycemia   . Hyperlipidemia     takes Pravastatin 3 times a week  . Chronic back pain     multiple back surgeries  . DVT (deep venous thrombosis) 03/2007    left leg   . Cancer   . Glaucoma   . Carpal tunnel syndrome   . Pulmonary embolism 2008  . Complication of anesthesia     pt states woke up during hemorroid surgery  . HTN (hypertension)     takes  Ramipril daily  . Pneumonia     many yrs ago  . Arthritis   . Joint pain   . Joint swelling   . Constipation     Metamucil every mornind  . GERD (gastroesophageal reflux disease)     takes Protonix daily  . History of colon polyps   . Urinary frequency   . Urinary urgency   . Nocturia   . Cataract     left eye  . History of MRSA infection 1992    Past Surgical History  Procedure Laterality Date  . Cervical and lumbar surgery    . Knee surgery      left  . Pvi  01/24/10    afib ablation  . Colonoscopy  04/24/2011    Procedure: COLONOSCOPY;  Surgeon: Rogene Houston, MD;  Location: [facility];  Service: Endoscopy;  Laterality: N/A;  1200  . Esophagogastroduodenoscopy   03/10/2012    Procedure: ESOPHAGOGASTRODUODENOSCOPY (EGD);  Surgeon: Rogene Houston, MD;  Location: [facility];  Service: Endoscopy;  Laterality: N/A;  325  . Cardiac catheterization  2008  . Cataract surgery Right   . Knee arthroscopy Right   . Hemorrhoidectomy with hemorrhoid banding    . Carpal tunnel release Right   . Lasik      Family History  Problem Relation Age of Onset  . Hypertension    . Heart disease    . Hypertension Mother   . Transient ischemic attack Mother   . Heart disease Father   . Diabetes Brother   . Healthy Daughter   . Colon cancer Sister    Social History:  reports that he has never smoked. He has never used smokeless tobacco. He reports that he drinks alcohol. He reports that he does not use illicit drugs.  Allergies:  Allergies  Allergen Reactions  . Lipitor [Atorvastatin]   . Sectral [Acebutolol Hcl]     Side effects  . Sulfonamide Derivatives Other (See Comments)    Runs blood pressure up  . Zocor [Simvastatin]     Joint pain    Medications  Prior to Admission  Medication Sig Dispense Refill  . acetaminophen (TYLENOL) 500 MG tablet Take 500 mg by mouth every 8 (eight) hours as needed for headache (pain).     . dabigatran (PRADAXA) 150 MG CAPS capsule Take 1 capsule (150 mg total) by mouth every 12 (twelve) hours. 60 capsule 11  . indapamide (LOZOL) 1.25 MG tablet Take 1 tablet (1.25 mg total) by mouth daily. 90 tablet 1  . Oxycodone HCl 10 MG TABS Take 1 tablet (10 mg total) by mouth every 6 (six) hours as needed. For pain (Patient taking differently: Take 10 mg by mouth every 6 (six) hours as needed (pain). ) 120 tablet 0  . pantoprazole (PROTONIX) 40 MG tablet Take 1 tablet (40 mg total) by mouth daily. (Patient taking differently: Take 40 mg by mouth daily as needed (gastric ulcer). ) 90 tablet 3  . pravastatin (PRAVACHOL) 40 MG tablet Take 1 tablet (40 mg total) by mouth daily. 90 tablet 1  . ramipril (ALTACE) 10 MG capsule Take 1 capsule  (10 mg total) by mouth 2 (two) times daily. 180 capsule 1    No results found for this or any previous visit (from the past 48 hour(s)). No results found.  Review of Systems  HENT: Negative.   Eyes: Negative.   Respiratory: Negative.   Cardiovascular: Negative.   Gastrointestinal: Negative.   Genitourinary: Negative.   Musculoskeletal: Positive for back pain.  Skin: Negative.   Neurological: Positive for weakness.  Endo/Heme/Allergies: Negative.   Psychiatric/Behavioral: Negative.     Blood pressure 159/83, pulse 59, temperature 98.3 F (36.8 C), resp. rate 20, height 5\' 6"  (1.676 m), weight 82.101 kg (181 lb), SpO2 99 %. Physical Exam  Constitutional: He is oriented to person, place, and time. He appears well-developed and well-nourished.  HENT:  Head: Normocephalic and atraumatic.  Eyes: Conjunctivae and EOM are normal. Pupils are equal, round, and reactive to light.  Neck: Normal range of motion. Neck supple.  Cardiovascular: Normal rate, regular rhythm and normal heart sounds.   Respiratory: Breath sounds normal.  GI: Soft. Bowel sounds are normal.  Musculoskeletal:  Markedly forward stoop posture. Patient notes accentuation of pain when he attempts to straighten into the erect position. Motor function lower extremities reveals good strength in the iliopsoas the quads tibialis anterior and gastrocs. Deep tendon reflexes are absent in the patellae and Achilles both.  Neurological: He is alert and oriented to person, place, and time. He displays abnormal reflex. No cranial nerve deficit. He exhibits normal muscle tone. Coordination normal.  Skin: Skin is warm and dry.  Psychiatric: He has a normal mood and affect. His behavior is normal. Judgment and thought content normal.     Assessment/Plan Spondylosis and pseudoarthrosis L5-S1 with loosening of hardware at S1.  Plan removal of hardware from S1 sacral screws which are loose.  Tallula Grindle J 04/24/2014, 10:54 AM

## 2014-04-24 NOTE — Discharge Summary (Signed)
Physician Discharge Summary  Patient ID: [name] MRN: [id number] DOB/AGE: [birth year] 73 y.o.  Admit date: 04/24/2014 Discharge date: 04/24/2014  Admission Diagnoses: Pseudoarthrosis L5-S1 with loose hardware  Discharge Diagnoses: Pseudoarthrosis L5-S1 with loose hardware Active Problems:   Pseudoarthrosis of lumbar spine   Discharged Condition: good  Hospital Course: Patient tolerated surgery well  Consults: None  Significant Diagnostic Studies: None  Treatments: surgery: Removal of hardware  S1 with Metrix technique.  Discharge Exam: Blood pressure 159/68, pulse 74, temperature 98.2 F (36.8 C), resp. rate 20, height 5\' 6"  (1.676 m), weight 82.101 kg (181 lb), SpO2 96 %. Incisions are clean dry. Motor function is intact in lower extremities.  Disposition: 01-Home or Self Care  Discharge Instructions    Call MD for:  redness, tenderness, or signs of infection (pain, swelling, redness, odor or green/yellow discharge around incision site)    Complete by:  As directed      Call MD for:  severe uncontrolled pain    Complete by:  As directed      Call MD for:  temperature >100.4    Complete by:  As directed      Diet - low sodium heart healthy    Complete by:  As directed      Discharge instructions    Complete by:  As directed   Okay to shower. Do not apply salves or appointments to incision. No heavy lifting with the upper extremities greater than 15 pounds. May resume driving when not requiring pain medication and patient feels comfortable with doing so.     Increase activity slowly    Complete by:  As directed             Medication List    TAKE these medications        acetaminophen 500 MG tablet  Commonly known as:  TYLENOL  Take 500 mg by mouth every 8 (eight) hours as needed for headache (pain).     dabigatran 150 MG Caps capsule  Commonly known as:  PRADAXA  Take 1 capsule (150 mg total) by mouth every 12 (twelve) hours.     diazepam 5 MG tablet   Commonly known as:  VALIUM  Take 1 tablet (5 mg total) by mouth every 6 (six) hours as needed for muscle spasms.     indapamide 1.25 MG tablet  Commonly known as:  LOZOL  Take 1 tablet (1.25 mg total) by mouth daily.     Oxycodone HCl 10 MG Tabs  Take 1 tablet (10 mg total) by mouth every 6 (six) hours as needed. For pain     oxyCODONE-acetaminophen 5-325 MG per tablet  Commonly known as:  PERCOCET/ROXICET  Take 1-2 tablets by mouth every 4 (four) hours as needed for moderate pain.     pantoprazole 40 MG tablet  Commonly known as:  PROTONIX  Take 1 tablet (40 mg total) by mouth daily.     pravastatin 40 MG tablet  Commonly known as:  PRAVACHOL  Take 1 tablet (40 mg total) by mouth daily.     ramipril 10 MG capsule  Commonly known as:  ALTACE  Take 1 capsule (10 mg total) by mouth 2 (two) times daily.         SignedEarleen Newport 04/24/2014, 8:11 PM

## 2014-04-24 NOTE — Plan of Care (Signed)
Problem: Consults Goal: Diagnosis - Spinal Surgery Outcome: Completed/Met Date Met:  04/24/14 REMOVAL OF SACRAL INSTRUMENTATION WITH METREX

## 2014-04-27 ENCOUNTER — Telehealth: Payer: Self-pay | Admitting: Family Medicine

## 2014-04-27 MED ORDER — CEFPROZIL 500 MG PO TABS: 500.0000 mg | ORAL_TABLET | Freq: Two times a day (BID) | ORAL | Status: DC

## 2014-04-27 NOTE — Telephone Encounter (Signed)
Patient cannot come in the office due to recent back surgery. He is having sinus drainage, coughing up phlegm. He describes it as a bad cold or sinus infection. Since he cannot come in the office to be seen, he wants to know if we can call in medicine for him? [facility]

## 2014-04-27 NOTE — Telephone Encounter (Signed)
Pt does not have any red flags ( dyspnea, high fever), just cough with congestion. Told him to f/u if ongoing. Pt notified and verbalized understanding.

## 2014-04-27 NOTE — Telephone Encounter (Signed)
1- nurses call pt 2- verify and document Sx make sure no red flags ( dyspnea, high fever) 3- may send in Cefzil 500 one bid 10 days, f/u if ongoing

## 2014-04-30 ENCOUNTER — Encounter (HOSPITAL_COMMUNITY): Payer: Self-pay | Admitting: Neurological Surgery

## 2014-06-21 DIAGNOSIS — Z981 Arthrodesis status: Secondary | ICD-10-CM | POA: Diagnosis not present

## 2014-06-21 DIAGNOSIS — Z6829 Body mass index (BMI) 29.0-29.9, adult: Secondary | ICD-10-CM | POA: Diagnosis not present

## 2014-06-21 DIAGNOSIS — S32110A Nondisplaced Zone I fracture of sacrum, initial encounter for closed fracture: Secondary | ICD-10-CM | POA: Diagnosis not present

## 2014-07-11 ENCOUNTER — Encounter: Payer: Self-pay | Admitting: Internal Medicine

## 2014-07-11 ENCOUNTER — Ambulatory Visit (INDEPENDENT_AMBULATORY_CARE_PROVIDER_SITE_OTHER): Payer: Medicare Other | Admitting: Internal Medicine

## 2014-07-11 VITALS — BP 160/80 | HR 62 | Ht 66.0 in | Wt 184.4 lb

## 2014-07-11 DIAGNOSIS — I10 Essential (primary) hypertension: Secondary | ICD-10-CM

## 2014-07-11 DIAGNOSIS — I4891 Unspecified atrial fibrillation: Secondary | ICD-10-CM | POA: Diagnosis not present

## 2014-07-11 NOTE — Patient Instructions (Signed)
Your physician wants you to follow-up in: 12 months with [name] Palau, NP You will receive a reminder letter in the mail two months in advance. If you don't receive a letter, please call our office to schedule the follow-up appointment.

## 2014-07-11 NOTE — Progress Notes (Signed)
TSV:[id number],ZESPQ, MD  The patient presents today for routine electrophysiology followup.  Since last being seen in our clinic, the patient reports doing very well from a heart standpoint.Marland Kitchen  He is unaware of any atrial fibrillation. His primary concern remains back pain, he had back surgery in January after being hit by a 1000 lb hay bale and then a few weeks ago fell on the ice and broke his tail bone, still in pain from this and he contributes his BP being up today due to the pain. Continues on xarelto for a chads2vasc score of 2 and h/o PE/DVT, without bleeding issues.   Today, he denies symptoms of palpitations, chest pain, shortness of breath, orthopnea, PND, lower extremity edema, dizziness, presyncope, syncope, or neurologic sequela.    Past Medical History  Diagnosis Date  . Paroxysmal atrial fibrillation     takes Pradaxa daily  . History of hyperglycemia   . Hyperlipidemia     takes Pravastatin 3 times a week  . Chronic back pain     multiple back surgeries  . DVT (deep venous thrombosis) 03/2007    left leg   . Cancer   . Glaucoma   . Carpal tunnel syndrome   . Pulmonary embolism 2008  . Complication of anesthesia     pt states woke up during hemorroid surgery  . HTN (hypertension)     takes  Ramipril daily  . Pneumonia     many yrs ago  . Arthritis   . Joint pain   . Joint swelling   . Constipation     Metamucil every mornind  . GERD (gastroesophageal reflux disease)     takes Protonix daily  . History of colon polyps   . Urinary frequency   . Urinary urgency   . Nocturia   . Cataract     left eye  . History of MRSA infection 1992   Past Surgical History  Procedure Laterality Date  . Cervical and lumbar surgery    . Knee surgery      left  . Pvi  01/24/10    afib ablation  . Colonoscopy  04/24/2011    Procedure: COLONOSCOPY;  Surgeon: Rogene Houston, MD;  Location: [facility];  Service: Endoscopy;  Laterality: N/A;  1200  . Esophagogastroduodenoscopy   03/10/2012    Procedure: ESOPHAGOGASTRODUODENOSCOPY (EGD);  Surgeon: Rogene Houston, MD;  Location: [facility];  Service: Endoscopy;  Laterality: N/A;  325  . Cardiac catheterization  2008  . Cataract surgery Right   . Knee arthroscopy Right   . Hemorrhoidectomy with hemorrhoid banding    . Carpal tunnel release Right   . Lasik    . Hardware removal N/A 04/24/2014    Procedure: REMOVAL OF SACRAL INSTRUMENTATION WITH METREX;  Surgeon: Kristeen Miss, MD;  Location: [place] [facility];  Service: Neurosurgery;  Laterality: N/A;    Current Outpatient Prescriptions  Medication Sig Dispense Refill  . acetaminophen (TYLENOL) 500 MG tablet Take 500 mg by mouth every 8 (eight) hours as needed for headache (pain).     . dabigatran (PRADAXA) 150 MG CAPS capsule Take 1 capsule (150 mg total) by mouth every 12 (twelve) hours. 60 capsule 11  . diazepam (VALIUM) 5 MG tablet Take 1 tablet (5 mg total) by mouth every 6 (six) hours as needed for muscle spasms. 30 tablet 0  . indapamide (LOZOL) 1.25 MG tablet Take 1 tablet (1.25 mg total) by mouth daily. 90 tablet 1  . Oxycodone HCl  10 MG TABS Take 1 tablet (10 mg total) by mouth every 6 (six) hours as needed. For pain (Patient taking differently: Take 10 mg by mouth every 6 (six) hours as needed (pain). ) 120 tablet 0  . pantoprazole (PROTONIX) 40 MG tablet Take 1 tablet (40 mg total) by mouth daily. (Patient taking differently: Take 40 mg by mouth daily as needed (gastric ulcer). ) 90 tablet 3  . pravastatin (PRAVACHOL) 40 MG tablet Take 1 tablet (40 mg total) by mouth daily. 90 tablet 1  . ramipril (ALTACE) 10 MG capsule Take 1 capsule (10 mg total) by mouth 2 (two) times daily. 180 capsule 1   No current facility-administered medications for this visit.    Allergies  Allergen Reactions  . Lipitor [Atorvastatin]     Severe joint pain  . Sectral [Acebutolol Hcl]     Side effects  . Sulfonamide Derivatives Other (See Comments)    Runs blood pressure up  .  Zocor [Simvastatin]     Joint pain    History   Social History  . Marital Status: Married    Spouse Name: N/A  . Number of Children: N/A  . Years of Education: N/A   Occupational History  . retired    Social History Main Topics  . Smoking status: Never Smoker   . Smokeless tobacco: Never Used  . Alcohol Use: Yes     Comment: wine nightly  . Drug Use: No  . Sexual Activity: Yes   Other Topics Concern  . Not on file   Social History Narrative    Family History  Problem Relation Age of Onset  . Hypertension    . Heart disease    . Hypertension Mother   . Transient ischemic attack Mother   . Heart disease Father   . Diabetes Brother   . Healthy Daughter   . Colon cancer Sister    Physical Exam: Filed Vitals:   07/11/14 1234 07/11/14 1302  BP: 160/84 160/80  Pulse: 62   Height: 5\' 6"  (1.676 m)   Weight: 184 lb 6.4 oz (83.643 kg)     GEN- The patient is well appearing, alert and oriented x 3 today.   Head- normocephalic, atraumatic Eyes-  Sclera clear, conjunctiva pink Ears- hearing intact Oropharynx- clear Neck- supple, no JVP Lymph- no cervical lymphadenopathy Lungs- Clear to ausculation bilaterally, normal work of breathing Heart- Regular rate and rhythm, no murmurs, rubs or gallops, PMI not laterally displaced GI- soft, NT, ND, + BS Extremities- no clubbing, cyanosis, or edema MS- mild sweling in the MCP/PCP joints of both hands with tenderness Skin- no rash or lesion Psych- euthymic mood, full affect Neuro- strength and sensation are intact  ekg today reveals sinus rhythm, otherwise normal ekg Echo 2014 reviewed with patient  Assessment and Plan:   1. afib Well controlled chads2vasc score is at least 2 Continue lifelong anticoagulation 12/3 Bmet reviewed with Creat 1.18   2. Fatigue/ decreased exercise tolerance/ snoring He declines sleep study  3. HL Stable No change required today  4. HTN Elevated today, pt contributes to pain  from fx of coccyx.  Not sure if took BP pill today Not avoiding salt. Pt asked to take med on regular basis and avoid salt.  F/u in afib clinic in one year.    Return in 12 months

## 2014-07-12 ENCOUNTER — Other Ambulatory Visit (INDEPENDENT_AMBULATORY_CARE_PROVIDER_SITE_OTHER): Payer: Self-pay | Admitting: Internal Medicine

## 2014-07-19 ENCOUNTER — Other Ambulatory Visit: Payer: Self-pay | Admitting: Internal Medicine

## 2014-09-05 ENCOUNTER — Encounter (INDEPENDENT_AMBULATORY_CARE_PROVIDER_SITE_OTHER): Payer: Self-pay | Admitting: *Deleted

## 2014-11-16 LAB — HM DIABETES EYE EXAM

## 2014-11-22 ENCOUNTER — Encounter (INDEPENDENT_AMBULATORY_CARE_PROVIDER_SITE_OTHER): Payer: Self-pay | Admitting: Internal Medicine

## 2014-11-22 ENCOUNTER — Ambulatory Visit (INDEPENDENT_AMBULATORY_CARE_PROVIDER_SITE_OTHER): Payer: Medicare Other | Admitting: Internal Medicine

## 2014-11-22 VITALS — BP 112/60 | HR 64 | Temp 97.0°F | Ht 66.0 in | Wt 184.2 lb

## 2014-11-22 DIAGNOSIS — K219 Gastro-esophageal reflux disease without esophagitis: Secondary | ICD-10-CM

## 2014-11-22 NOTE — Progress Notes (Signed)
   Subjective:    Patient ID: [name], male    DOB: 1940-12-06, 74 y.o.   MRN: [id number]  HPI Here today for f/u of his GERD. He is doing good. He has lost weight. He has changed his diet. Last weight in May was 202. Today his weight 184. He stopped eating chocolates, sweets, and breads. He is doing good. He hurts all the times from his arthritis. He takes Tylenol twice a day for pain. His appetite has remained good.  Usually has BM daily. No melena or BRRB.       03/10/2012 EGD: Impression:  Mild changes of reflux esophagitis limited to GE junction which is serrated. Biopsy taken to rule out short segment Barrett's esophagus.  Small sliding hiatal hernia.  Pyloric channel inflammation without stricture or ulcer.  Biopsy:  Notes Recorded by Rogene Houston, MD on 03/21/2012 at 2:04 PM H. Pylori serology is negative. Patient not doing any better with pantoprazole twice a day according to his wife. Will proceed with solid phase GES Report to PCP  04/07/2012: Gastric emptying normal.   Review of Systems Alert and oriented. Skin warm and dry. Oral mucosa is moist.   . Sclera anicteric, conjunctivae is pink. Thyroid not enlarged. No cervical lymphadenopathy. Lungs clear. Heart regular rate and rhythm.  Abdomen is soft. Bowel sounds are positive. No hepatomegaly. No abdominal masses felt. No tenderness.  No edema to lower extremities. Patient is alert and oriented.    Objective:   Physical ExamBlood pressure 112/60, pulse 64, temperature 97 F (36.1 C), height 5\' 6"  (1.676 m), weight 184 lb 3.2 oz (83.553 kg).  Alert and oriented. Skin warm and dry. Oral mucosa is moist.   . Sclera anicteric, conjunctivae is pink. Thyroid not enlarged. No cervical lymphadenopathy. Lungs clear. Heart regular rate and rhythm.  Abdomen is soft. Bowel sounds are positive. No hepatomegaly. No abdominal masses felt. No tenderness.  No edema to lower extremities.        Assessment & Plan:  GERD  controlled for the most part with Protonix. If he knows if he eats something spicy, he will have acid reflux. OV in 1 year.

## 2014-11-22 NOTE — Patient Instructions (Signed)
OV in 1 year.  

## 2014-11-30 ENCOUNTER — Ambulatory Visit (INDEPENDENT_AMBULATORY_CARE_PROVIDER_SITE_OTHER): Payer: Medicare Other | Admitting: Family Medicine

## 2014-11-30 ENCOUNTER — Encounter: Payer: Self-pay | Admitting: Family Medicine

## 2014-11-30 VITALS — BP 122/82 | Ht 66.0 in | Wt 185.0 lb

## 2014-11-30 DIAGNOSIS — R05 Cough: Secondary | ICD-10-CM

## 2014-11-30 DIAGNOSIS — R059 Cough, unspecified: Secondary | ICD-10-CM

## 2014-11-30 MED ORDER — OXYCODONE HCL 10 MG PO TABS: 10.0000 mg | ORAL_TABLET | Freq: Four times a day (QID) | ORAL | Status: DC | PRN

## 2014-11-30 NOTE — Progress Notes (Signed)
   Subjective:    Patient ID: [name], male    DOB: 1941/03/09, 74 y.o.   MRN: [id number]  HPI  Patient arrives to discuss problems with congestion in the am. Patient states he has woke up every morning for 6 months with a major amount of phlegm and would like a chest xray. Denies fever, no wheeze, clear phlegm in the am   he denies any chest tightness pressure pain he denies shortness of breath he denies wheezing.  Patient also had a bump on chest that itches. He was done go see the dermatologist but then he put it off  Review of Systems  Constitutional: Negative for fever and fatigue.  HENT: Positive for congestion and rhinorrhea. Negative for sneezing.   Respiratory: Positive for cough. Negative for shortness of breath and wheezing.   Cardiovascular: Negative for chest pain.       Objective:   Physical Exam  Constitutional: He appears well-developed.  HENT:  Head: Normocephalic.  Mouth/Throat: Oropharynx is clear and moist. No oropharyngeal exudate.  Neck: Normal range of motion.  Cardiovascular: Normal rate, regular rhythm and normal heart sounds.   No murmur heard. Pulmonary/Chest: Effort normal and breath sounds normal. He has no wheezes.  Lymphadenopathy:    He has no cervical adenopathy.  Neurological: He exhibits normal muscle tone.  Skin: Skin is warm and dry.  Nursing note and vitals reviewed.         Assessment & Plan:   scar tissue versus what appears to be a fibroma on the chest wall I don't find any evidence of any type underlying issue. I would not recommend any type of intervention currently but should see the dermatologist by September  Chronic pain discomfort in the lower back uses oxycodone intermittently prescription given    increased a.m. Congestion I recommend chest x-ray. I believe is probably related to postnasal drainage. If progressive troubles or worse follow-up

## 2014-11-30 NOTE — Patient Instructions (Addendum)
Can try generic Allegra ( fexofenadine 180 mg) when allergies/ sinuses bother you  Do the chest xray, we will call you with the result

## 2014-12-03 ENCOUNTER — Ambulatory Visit (HOSPITAL_COMMUNITY)
Admission: RE | Admit: 2014-12-03 | Discharge: 2014-12-03 | Disposition: A | Discharge status: Home / Self Care | Payer: Medicare Other | Source: Ambulatory Visit | Attending: Family Medicine | Admitting: Family Medicine

## 2014-12-03 DIAGNOSIS — R05 Cough: Secondary | ICD-10-CM | POA: Diagnosis not present

## 2014-12-18 ENCOUNTER — Emergency Department (HOSPITAL_COMMUNITY)
Admission: EM | Admit: 2014-12-18 | Discharge: 2014-12-18 | Disposition: A | Discharge status: Home / Self Care | Payer: Medicare Other | Attending: Emergency Medicine | Admitting: Emergency Medicine

## 2014-12-18 ENCOUNTER — Encounter (HOSPITAL_COMMUNITY): Payer: Self-pay | Admitting: Emergency Medicine

## 2014-12-18 ENCOUNTER — Telehealth: Payer: Self-pay | Admitting: *Deleted

## 2014-12-18 ENCOUNTER — Emergency Department (HOSPITAL_COMMUNITY): Payer: Medicare Other

## 2014-12-18 DIAGNOSIS — Y998 Other external cause status: Secondary | ICD-10-CM | POA: Insufficient documentation

## 2014-12-18 DIAGNOSIS — Y9389 Activity, other specified: Secondary | ICD-10-CM | POA: Diagnosis not present

## 2014-12-18 DIAGNOSIS — Z79899 Other long term (current) drug therapy: Secondary | ICD-10-CM | POA: Diagnosis not present

## 2014-12-18 DIAGNOSIS — Z8701 Personal history of pneumonia (recurrent): Secondary | ICD-10-CM | POA: Insufficient documentation

## 2014-12-18 DIAGNOSIS — Z86711 Personal history of pulmonary embolism: Secondary | ICD-10-CM | POA: Insufficient documentation

## 2014-12-18 DIAGNOSIS — W2209XA Striking against other stationary object, initial encounter: Secondary | ICD-10-CM | POA: Insufficient documentation

## 2014-12-18 DIAGNOSIS — Z8614 Personal history of Methicillin resistant Staphylococcus aureus infection: Secondary | ICD-10-CM | POA: Insufficient documentation

## 2014-12-18 DIAGNOSIS — K5900 Constipation, unspecified: Secondary | ICD-10-CM | POA: Insufficient documentation

## 2014-12-18 DIAGNOSIS — I10 Essential (primary) hypertension: Secondary | ICD-10-CM | POA: Diagnosis not present

## 2014-12-18 DIAGNOSIS — Z9889 Other specified postprocedural states: Secondary | ICD-10-CM | POA: Diagnosis not present

## 2014-12-18 DIAGNOSIS — Z859 Personal history of malignant neoplasm, unspecified: Secondary | ICD-10-CM | POA: Diagnosis not present

## 2014-12-18 DIAGNOSIS — S0502XA Injury of conjunctiva and corneal abrasion without foreign body, left eye, initial encounter: Secondary | ICD-10-CM | POA: Insufficient documentation

## 2014-12-18 DIAGNOSIS — H43812 Vitreous degeneration, left eye: Secondary | ICD-10-CM | POA: Diagnosis not present

## 2014-12-18 DIAGNOSIS — S0990XA Unspecified injury of head, initial encounter: Secondary | ICD-10-CM | POA: Insufficient documentation

## 2014-12-18 DIAGNOSIS — G8929 Other chronic pain: Secondary | ICD-10-CM | POA: Diagnosis not present

## 2014-12-18 DIAGNOSIS — Z86010 Personal history of colonic polyps: Secondary | ICD-10-CM | POA: Insufficient documentation

## 2014-12-18 DIAGNOSIS — K219 Gastro-esophageal reflux disease without esophagitis: Secondary | ICD-10-CM | POA: Diagnosis not present

## 2014-12-18 DIAGNOSIS — I480 Paroxysmal atrial fibrillation: Secondary | ICD-10-CM | POA: Insufficient documentation

## 2014-12-18 DIAGNOSIS — Z86718 Personal history of other venous thrombosis and embolism: Secondary | ICD-10-CM | POA: Insufficient documentation

## 2014-12-18 DIAGNOSIS — H3322 Serous retinal detachment, left eye: Secondary | ICD-10-CM | POA: Diagnosis not present

## 2014-12-18 DIAGNOSIS — E785 Hyperlipidemia, unspecified: Secondary | ICD-10-CM | POA: Insufficient documentation

## 2014-12-18 DIAGNOSIS — Y9289 Other specified places as the place of occurrence of the external cause: Secondary | ICD-10-CM | POA: Insufficient documentation

## 2014-12-18 LAB — CBC WITH DIFFERENTIAL/PLATELET
Basophils Absolute: 0 10*3/uL (ref 0.0–0.1)
Basophils Relative: 1 % (ref 0–1)
Eosinophils Absolute: 0.2 10*3/uL (ref 0.0–0.7)
Eosinophils Relative: 3 % (ref 0–5)
HCT: 42.3 % (ref 39.0–52.0)
Hemoglobin: 14.4 g/dL (ref 13.0–17.0)
Lymphocytes Relative: 26 % (ref 12–46)
Lymphs Abs: 1.2 10*3/uL (ref 0.7–4.0)
MCH: 30.8 pg (ref 26.0–34.0)
MCHC: 34 g/dL (ref 30.0–36.0)
MCV: 90.6 fL (ref 78.0–100.0)
Monocytes Absolute: 0.5 10*3/uL (ref 0.1–1.0)
Monocytes Relative: 10 % (ref 3–12)
Neutro Abs: 2.9 10*3/uL (ref 1.7–7.7)
Neutrophils Relative %: 60 % (ref 43–77)
Platelets: 231 10*3/uL (ref 150–400)
RBC: 4.67 MIL/uL (ref 4.22–5.81)
RDW: 13.2 % (ref 11.5–15.5)
WBC: 4.8 10*3/uL (ref 4.0–10.5)

## 2014-12-18 LAB — COMPREHENSIVE METABOLIC PANEL
ALT: 21 U/L (ref 17–63)
AST: 26 U/L (ref 15–41)
Albumin: 4 g/dL (ref 3.5–5.0)
Alkaline Phosphatase: 65 U/L (ref 38–126)
Anion gap: 9 (ref 5–15)
BUN: 24 mg/dL — ABNORMAL HIGH (ref 6–20)
CO2: 26 mmol/L (ref 22–32)
Calcium: 9.2 mg/dL (ref 8.9–10.3)
Chloride: 102 mmol/L (ref 101–111)
Creatinine, Ser: 0.95 mg/dL (ref 0.61–1.24)
GFR calc Af Amer: >60 mL/min (ref >60)
GFR calc non Af Amer: >60 mL/min (ref >60)
Glucose, Bld: 128 mg/dL — ABNORMAL HIGH (ref 65–99)
Potassium: 3.8 mmol/L (ref 3.5–5.1)
Sodium: 137 mmol/L (ref 135–145)
Total Bilirubin: 0.7 mg/dL (ref 0.3–1.2)
Total Protein: 6.9 g/dL (ref 6.5–8.1)

## 2014-12-18 MED ORDER — ERYTHROMYCIN 5 MG/GM OP OINT: TOPICAL_OINTMENT | OPHTHALMIC | Status: DC

## 2014-12-18 MED ORDER — FLUORESCEIN SODIUM 1 MG OP STRP
1.0000 | ORAL_STRIP | Freq: Once | OPHTHALMIC | Status: AC
  Administered 2014-12-18: 1 via OPHTHALMIC
  Filled 2014-12-18: qty 1

## 2014-12-18 NOTE — Telephone Encounter (Signed)
Pt called hit his eye on Saturday and has been seeing flashing lights since then. Called eye dr and he was out of office. Consult with Dr.Scott pt advised to have someone take him directly to [facility]. Pt advised he could have possible detached retina.  Pt states his wife will take him to [facility]. Triage nurse at [facility] called and notified he is on his way.

## 2014-12-18 NOTE — ED Provider Notes (Signed)
CSN: [id number]     Arrival date & time 12/18/14  1050 History   First MD Initiated Contact with Patient 12/18/14 1056     Chief Complaint  Patient presents with  . Head Injury  . Eye Pain     (Consider location/radiation/quality/duration/timing/severity/associated sxs/prior Treatment) The history is provided by the patient.  [name] is a 74 y.o. male hx of paroxysmal afib, HL, DVT on pradaxa here with spots in left eye, headaches, head injury. 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 Had some bleeding that stopped. That night, he noticed seeing spots that appears like bugs flying around his L eye. Noticed that spots go away during the day but he sees them in the evening. No blurry vision. Has some headaches but no vomiting. Called ophtho today, and his ophthalmologist is unavailable so he was sent for eval.    Past Medical History  Diagnosis Date  . Paroxysmal atrial fibrillation     takes Pradaxa daily, Chads2vasc score 2(07/11/14)  . History of hyperglycemia   . Hyperlipidemia     takes Pravastatin 3 times a week  . Chronic back pain     multiple back surgeries  . DVT (deep venous thrombosis) 03/2007    left leg   . Cancer   . Glaucoma   . Carpal tunnel syndrome   . Pulmonary embolism 2008  . Complication of anesthesia     pt states woke up during hemorroid surgery  . HTN (hypertension)     takes  Ramipril daily  . Pneumonia     many yrs ago  . Arthritis   . Joint pain   . Joint swelling   . Constipation     Metamucil every mornind  . GERD (gastroesophageal reflux disease)     takes Protonix daily  . History of colon polyps   . Urinary frequency   . Urinary urgency   . Nocturia   . Cataract     left eye  . History of MRSA infection 1992   Past Surgical History  Procedure Laterality Date  . Cervical and lumbar surgery    . Knee surgery      left  . Pvi  01/24/10    afib ablation  . Colonoscopy   04/24/2011    Procedure: COLONOSCOPY;  Surgeon: Rogene Houston, MD;  Location: [facility];  Service: Endoscopy;  Laterality: N/A;  1200  . Esophagogastroduodenoscopy  03/10/2012    Procedure: ESOPHAGOGASTRODUODENOSCOPY (EGD);  Surgeon: Rogene Houston, MD;  Location: [facility];  Service: Endoscopy;  Laterality: N/A;  325  . Cardiac catheterization  2008  . Cataract surgery Right   . Knee arthroscopy Right   . Hemorrhoidectomy with hemorrhoid banding    . Carpal tunnel release Right   . Lasik    . Hardware removal N/A 04/24/2014    Procedure: REMOVAL OF SACRAL INSTRUMENTATION WITH METREX;  Surgeon: Kristeen Miss, MD;  Location: [facility];  Service: Neurosurgery;  Laterality: N/A;   Family History  Problem Relation Age of Onset  . Hypertension    . Heart disease    . Hypertension Mother   . Transient ischemic attack Mother   . Heart disease Father   . Diabetes Brother   . Healthy Daughter   . Colon cancer Sister    History  Substance Use Topics  . Smoking status: Never Smoker   . Smokeless tobacco: Never  Used  . Alcohol Use: Yes     Comment: wine nightly    Review of Systems  Eyes: Positive for pain.  Neurological: Positive for headaches.  All other systems reviewed and are negative.     Allergies  Lipitor; Sectral; Sulfonamide derivatives; and Zocor  Home Medications   Prior to Admission medications   Medication Sig Start Date End Date Taking? Authorizing Provider  acetaminophen (TYLENOL) 500 MG tablet Take 500 mg by mouth every 8 (eight) hours as needed for headache (pain).     Historical Provider, MD  diazepam (VALIUM) 5 MG tablet Take 1 tablet (5 mg total) by mouth every 6 (six) hours as needed for muscle spasms. Patient not taking: Reported on 11/22/2014 04/24/14   Kristeen Miss, MD  indapamide (LOZOL) 1.25 MG tablet Take 1 tablet (1.25 mg total) by mouth daily. 10/12/13   Kathyrn Drown, MD  Oxycodone HCl 10 MG TABS Take 1 tablet (10 mg total) by mouth every  6 (six) hours as needed. For pain 11/30/14   Kathyrn Drown, MD  pantoprazole (PROTONIX) 40 MG tablet TAKE 1 TABLET (40 MG TOTAL) BY MOUTH DAILY. Patient not taking: Reported on 11/22/2014 07/12/14   Rogene Houston, MD  PRADAXA 150 MG CAPS capsule TAKE ONE CAPSULE BY MOUTH EVERY 12 HOURS 07/20/14   Thompson Grayer, MD  pravastatin (PRAVACHOL) 40 MG tablet Take 1 tablet (40 mg total) by mouth daily. 10/12/13   Kathyrn Drown, MD  ramipril (ALTACE) 10 MG capsule Take 1 capsule (10 mg total) by mouth 2 (two) times daily. 10/12/13   Kathyrn Drown, MD   BP 125/60 mmHg  Pulse 56  Temp(Src) 98.2 F (36.8 C) (Oral)  Resp 20  Ht 5\' 6"  (1.676 m)  Wt 185 lb (83.915 kg)  BMI 29.87 kg/m2  SpO2 94% Physical Exam  Constitutional: He is oriented to person, place, and time. He appears well-developed and well-nourished.  HENT:  Head: Normocephalic.  Mouth/Throat: Oropharynx is clear and moist.  L forehead with laceration that was healed over, no active bleeding   Eyes: Conjunctivae are normal. Pupils are equal, round, and reactive to light.  Minimal redness L conjunctiva. No obvious hyphema or subconjunctival hemorrhage. Under flourescein, small corneal abrasions medial aspect. No foreign body under the eyelid   Neck: Normal range of motion. Neck supple.  Cardiovascular: Normal rate, regular rhythm and normal heart sounds.   Pulmonary/Chest: Effort normal and breath sounds normal. No respiratory distress. He has no wheezes. He has no rales.  Abdominal: Soft. Bowel sounds are normal. He exhibits no distension. There is no tenderness. There is no rebound.  Musculoskeletal: Normal range of motion. He exhibits no edema or tenderness.  Neurological: He is alert and oriented to person, place, and time. No cranial nerve deficit. Coordination normal.  CN 2-12 intact. Nl strength throughout   Skin: Skin is warm and dry.  Psychiatric: He has a normal mood and affect. His behavior is normal. Judgment and thought content  normal.  Nursing note and vitals reviewed.   ED Course  Procedures (including critical care time)  EMERGENCY DEPARTMENT [place] OCULAR EXAM "Study: Limited Ultrasound of Orbit "  INDICATIONS: Floaters/Flashes  Linear probe utilized to obtain images in both long and short axis of the orbit having the patient look left and right if possible.  PERFORMED BY: Myself  IMAGES ARCHIVED?: Yes  LIMITATIONS: none  VIEWS USED: Left orbit  INTERPRETATION: Retinal detachment present. Small retinal detachment, no obvious vitreous hemorrhage.  Labs Review Labs Reviewed  COMPREHENSIVE METABOLIC PANEL - Abnormal; Notable for the following:    Glucose, Bld 128 (*)    BUN 24 (*)    All other components within normal limits  CBC WITH DIFFERENTIAL/PLATELET    Imaging Review Ct Head Wo Contrast  12/18/2014   CLINICAL DATA:  74 year old male hit left eye 3 days ago. Flashing light sensation since. History of glaucoma, left cataract and hypertension. Initial encounter.  EXAM: CT HEAD WITHOUT CONTRAST  TECHNIQUE: Contiguous axial images were obtained from the base of the skull through the vertex without intravenous contrast.  COMPARISON:  05/22/2005 MR.  FINDINGS: Left globe appears to be grossly intact with dense lens in normal position. Post right lens replacement.  No skull fracture or intracranial hemorrhage.  Mucosal thickening ethmoid sinus air cells and right maxillary sinus.  Small vessel disease type changes without CT evidence of large acute infarct.  No intracranial mass lesion noted on this unenhanced exam.  No hydrocephalus.  Vascular calcifications.  IMPRESSION: Left globe appears to be grossly intact with dense lens in normal position. Post right lens replacement.  No skull fracture or intracranial hemorrhage.  Mucosal thickening ethmoid sinus air cells and right maxillary sinus.  Small vessel disease type changes without CT evidence of large acute infarct.   Electronically Signed   By: Genia Del M.D.   On: 12/18/2014 12:43     EKG Interpretation None      MDM   Final diagnoses:  None    [name] is a 74 y.o. male here with head injury, spots L eye. Consider head bleed vs vitreous hemorrhage. No signs of ruptured globe. Will get labs, CT head. Will stain the eye with flourescein and will do eye [place].   1:14 PM CT head showed no bleed. There is corneal abrasion on L eye so will prescribe erythromycin ointment. There is small retinal detachment. His ophthalmologist is out for the week. Will refer him to see Dr. Ellie Lunch, on call eye doctor, this week.     Wandra Arthurs, MD 12/18/14 [phone]

## 2014-12-18 NOTE — ED Notes (Signed)
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   This was 3 days ago, is on pradaxa.  Grip strengths equal.

## 2014-12-18 NOTE — Discharge Instructions (Signed)
Use erythromycin ointment to left eye twice daily for a week.   Use eye protection in the future.   Please see Dr. Ellie Lunch in 1-2 days if your symptoms are not better or are getting worse.   Return to ER if you have blurry vision, severe eye pain, worse floaters.

## 2014-12-26 DIAGNOSIS — H18413 Arcus senilis, bilateral: Secondary | ICD-10-CM | POA: Diagnosis not present

## 2014-12-26 DIAGNOSIS — H2512 Age-related nuclear cataract, left eye: Secondary | ICD-10-CM | POA: Diagnosis not present

## 2014-12-26 DIAGNOSIS — H179 Unspecified corneal scar and opacity: Secondary | ICD-10-CM | POA: Diagnosis not present

## 2014-12-26 DIAGNOSIS — T1590XA Foreign body on external eye, part unspecified, unspecified eye, initial encounter: Secondary | ICD-10-CM | POA: Diagnosis not present

## 2014-12-31 DIAGNOSIS — Z1283 Encounter for screening for malignant neoplasm of skin: Secondary | ICD-10-CM | POA: Diagnosis not present

## 2014-12-31 DIAGNOSIS — X32XXXD Exposure to sunlight, subsequent encounter: Secondary | ICD-10-CM | POA: Diagnosis not present

## 2014-12-31 DIAGNOSIS — L570 Actinic keratosis: Secondary | ICD-10-CM | POA: Diagnosis not present

## 2014-12-31 DIAGNOSIS — D225 Melanocytic nevi of trunk: Secondary | ICD-10-CM | POA: Diagnosis not present

## 2014-12-31 DIAGNOSIS — C44719 Basal cell carcinoma of skin of left lower limb, including hip: Secondary | ICD-10-CM | POA: Diagnosis not present

## 2015-01-14 DIAGNOSIS — H4010X0 Unspecified open-angle glaucoma, stage unspecified: Secondary | ICD-10-CM | POA: Diagnosis not present

## 2015-01-14 DIAGNOSIS — Z9889 Other specified postprocedural states: Secondary | ICD-10-CM | POA: Diagnosis not present

## 2015-01-14 DIAGNOSIS — H25012 Cortical age-related cataract, left eye: Secondary | ICD-10-CM | POA: Diagnosis not present

## 2015-01-14 DIAGNOSIS — H179 Unspecified corneal scar and opacity: Secondary | ICD-10-CM | POA: Diagnosis not present

## 2015-01-14 DIAGNOSIS — H2512 Age-related nuclear cataract, left eye: Secondary | ICD-10-CM | POA: Diagnosis not present

## 2015-01-19 IMAGING — RF DG LUMBAR SPINE 2-3V
1 series · 2 of 2 positions shown · non-contrast
Comparison: Correlation with recently performed lumbar spine CT

CLINICAL DATA: 73-year-old evaluate sacral hardware removal

EXAM:
LUMBAR SPINE - 2-3 VIEW

[Series 1: run · 2 of 2 slices shown]
[im 1/2]
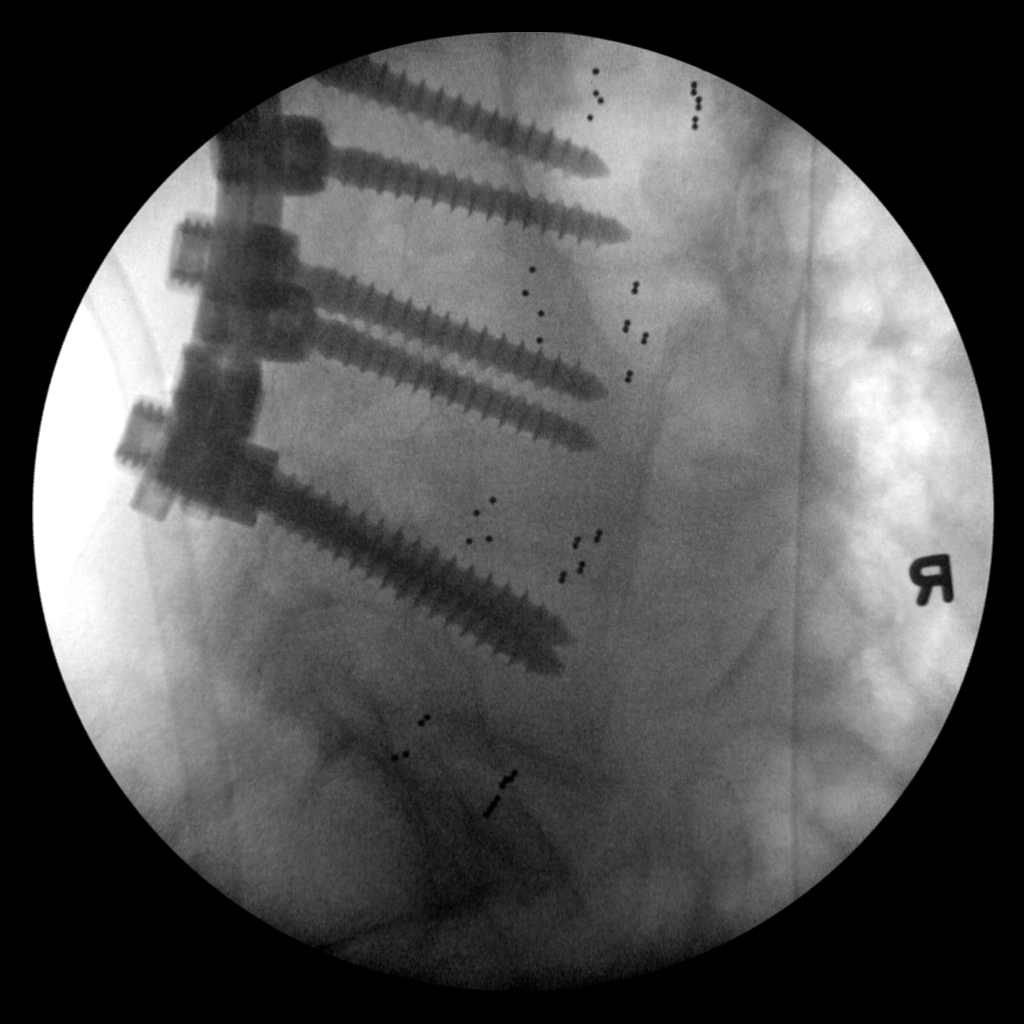
[im 2/2]
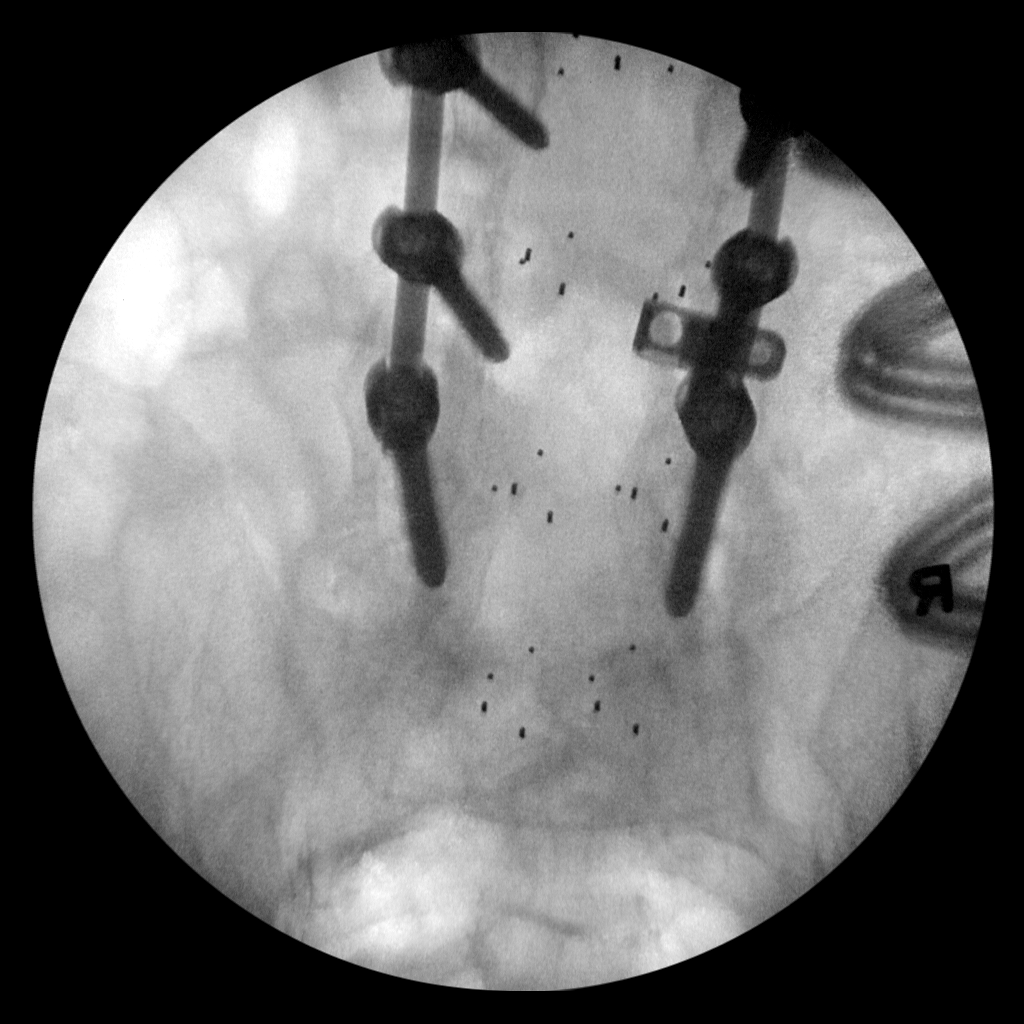

[2 of 2 positions shown; findings below may reference images not displayed]

FINDINGS: Spot fluoroscopic images demonstrate the presence of screw and rod
lumbar sacral hardware. Associated laminectomy defects and
degenerative changes are present.
IMPRESSION: Fluoroscopic images from sacral hardware removal.

## 2015-01-28 DIAGNOSIS — Z08 Encounter for follow-up examination after completed treatment for malignant neoplasm: Secondary | ICD-10-CM | POA: Diagnosis not present

## 2015-01-28 DIAGNOSIS — Z85828 Personal history of other malignant neoplasm of skin: Secondary | ICD-10-CM | POA: Diagnosis not present

## 2015-01-28 DIAGNOSIS — X32XXXD Exposure to sunlight, subsequent encounter: Secondary | ICD-10-CM | POA: Diagnosis not present

## 2015-01-28 DIAGNOSIS — L570 Actinic keratosis: Secondary | ICD-10-CM | POA: Diagnosis not present

## 2015-02-14 DIAGNOSIS — L0100 Impetigo, unspecified: Secondary | ICD-10-CM | POA: Diagnosis not present

## 2015-02-14 DIAGNOSIS — X32XXXD Exposure to sunlight, subsequent encounter: Secondary | ICD-10-CM | POA: Diagnosis not present

## 2015-02-14 DIAGNOSIS — L570 Actinic keratosis: Secondary | ICD-10-CM | POA: Diagnosis not present

## 2015-02-19 ENCOUNTER — Ambulatory Visit (INDEPENDENT_AMBULATORY_CARE_PROVIDER_SITE_OTHER): Payer: Medicare Other | Admitting: Family Medicine

## 2015-02-19 ENCOUNTER — Encounter: Payer: Self-pay | Admitting: Family Medicine

## 2015-02-19 VITALS — BP 128/82 | Ht 66.0 in | Wt 190.0 lb

## 2015-02-19 DIAGNOSIS — I10 Essential (primary) hypertension: Secondary | ICD-10-CM | POA: Diagnosis not present

## 2015-02-19 DIAGNOSIS — N400 Enlarged prostate without lower urinary tract symptoms: Secondary | ICD-10-CM

## 2015-02-19 DIAGNOSIS — M549 Dorsalgia, unspecified: Secondary | ICD-10-CM | POA: Diagnosis not present

## 2015-02-19 DIAGNOSIS — I480 Paroxysmal atrial fibrillation: Secondary | ICD-10-CM

## 2015-02-19 DIAGNOSIS — Z23 Encounter for immunization: Secondary | ICD-10-CM

## 2015-02-19 DIAGNOSIS — E785 Hyperlipidemia, unspecified: Secondary | ICD-10-CM | POA: Diagnosis not present

## 2015-02-19 DIAGNOSIS — R7303 Prediabetes: Secondary | ICD-10-CM

## 2015-02-19 DIAGNOSIS — G8929 Other chronic pain: Secondary | ICD-10-CM

## 2015-02-19 MED ORDER — INDAPAMIDE 2.5 MG PO TABS: 1.2500 mg | ORAL_TABLET | Freq: Every day | ORAL | Status: DC

## 2015-02-19 MED ORDER — OXYCODONE HCL 10 MG PO TABS: 10.0000 mg | ORAL_TABLET | Freq: Four times a day (QID) | ORAL | Status: DC | PRN

## 2015-02-19 MED ORDER — TAMSULOSIN HCL 0.4 MG PO CAPS: 0.4000 mg | ORAL_CAPSULE | Freq: Every day | ORAL | Status: DC

## 2015-02-19 MED ORDER — INDAPAMIDE 2.5 MG PO TABS
2.5000 mg | ORAL_TABLET | Freq: Every day | ORAL | Status: DC
Start: 2015-02-19 — End: 2015-06-07

## 2015-02-19 NOTE — Progress Notes (Signed)
   Subjective:    Patient ID: [name], male    DOB: 06/28/1940, 74 y.o.   MRN: [id number]  Hypertension This is a chronic problem. The current episode started more than 1 year ago. Pertinent negatives include no chest pain. Risk factors for coronary artery disease include dyslipidemia and male gender. Treatments tried: altace. There are no compliance problems.    Patient does have intermittent atrial fibrillation denies any tachycardia with it. Takes his blood thinner. Does not have any stroke symptoms. No bleeding issues. Patient with history of prediabetes he does try to watch starches in his diet is able to stay somewhat physically active denies excessive thirst urination Chronic low back pain radiates into both legs inoperable. His doctor numerous surgeries. Pain medicine does help but he denies abusing it. Hyperlipidemia does try to watch his diet. He does take his medicine on a regular basis. Denies any problems with this. Does well. Has history of enlarged prostate. Now P frequently at night he states he saw urologist few years ago they recommended a medication similar to Flomax I went ahead and gave her prescription for Flomax today. This should help him with his urination if any problems he is let us know. I don't feel the patient needs have any type of x-rays lab work on this currently.   Review of Systems  Constitutional: Negative for activity change, appetite change and fatigue.  HENT: Negative for congestion.   Respiratory: Negative for cough.   Cardiovascular: Negative for chest pain.  Gastrointestinal: Negative for abdominal pain.  Endocrine: Negative for polydipsia and polyphagia.  Musculoskeletal: Positive for back pain and arthralgias.  Neurological: Negative for weakness.  Psychiatric/Behavioral: Negative for confusion.       Objective:   Physical Exam  Constitutional: He appears well-nourished. No distress.  Cardiovascular: Normal rate, regular rhythm and  normal heart sounds.   No murmur heard. Pulmonary/Chest: Effort normal and breath sounds normal. No respiratory distress.  Musculoskeletal: He exhibits no edema.  Lymphadenopathy:    He has no cervical adenopathy.  Neurological: He is alert.  Psychiatric: His behavior is normal.  Vitals reviewed.   Diabetic foot exam completed today no problems. No neuropathy.      Assessment & Plan:  1. HYPERTENSION, BENIGN Blood pressure subpar control I recommend the importance of increasing Lozol. Used to 0.5 every morning. Watch diet stay physically active. - Hepatic function panel  2. Paroxysmal atrial fibrillation (HCC) Continue pradaxa, if any bleeding problems let us know  3. Prediabetes Check A1c watch starches in diet stay physically active - Basic metabolic panel - Hemoglobin A1c - Microalbumin / creatinine urine ratio  4. Chronic back pain greater than 3 months duration Pain medication was prescribed. He may use up to 4 times per day caution drowsiness if any problems with the medication he is to report to [place] to prescriptions were given. Patient states this medicine does not cause drowsiness he denies abusing it  5. Hyperlipidemia History hyperlipidemia check lipid profile watch diet closely - Lipid panel  6. BPH (benign prostatic hyperplasia) Urinating frequently at night we will try Flomax each night see if that helps if any problems he will notify us. - PSA  7. Encounter for immunization Flu shot today

## 2015-03-20 DIAGNOSIS — R7303 Prediabetes: Secondary | ICD-10-CM | POA: Diagnosis not present

## 2015-03-20 DIAGNOSIS — E785 Hyperlipidemia, unspecified: Secondary | ICD-10-CM | POA: Diagnosis not present

## 2015-03-20 DIAGNOSIS — N400 Enlarged prostate without lower urinary tract symptoms: Secondary | ICD-10-CM | POA: Diagnosis not present

## 2015-03-20 DIAGNOSIS — I10 Essential (primary) hypertension: Secondary | ICD-10-CM | POA: Diagnosis not present

## 2015-03-20 DIAGNOSIS — R7301 Impaired fasting glucose: Secondary | ICD-10-CM | POA: Diagnosis not present

## 2015-03-21 ENCOUNTER — Encounter: Payer: Self-pay | Admitting: Family Medicine

## 2015-03-21 LAB — BASIC METABOLIC PANEL
BUN/Creatinine Ratio: 23 — ABNORMAL HIGH (ref 10–22)
BUN: 24 mg/dL (ref 8–27)
CO2: 25 mmol/L (ref 18–29)
Calcium: 9.7 mg/dL (ref 8.6–10.2)
Chloride: 100 mmol/L (ref 97–106)
Creatinine, Ser: 1.04 mg/dL (ref 0.76–1.27)
GFR calc Af Amer: 81 mL/min/{1.73_m2} (ref >59)
GFR calc non Af Amer: 70 mL/min/{1.73_m2} (ref >59)
Glucose: 115 mg/dL — ABNORMAL HIGH (ref 65–99)
Potassium: 4.5 mmol/L (ref 3.5–5.2)
Sodium: 141 mmol/L (ref 136–144)

## 2015-03-21 LAB — LIPID PANEL
Chol/HDL Ratio: 3.8 ratio units (ref 0.0–5.0)
Cholesterol, Total: 165 mg/dL (ref 100–199)
HDL: 44 mg/dL (ref >39)
LDL Calculated: 101 mg/dL — ABNORMAL HIGH (ref 0–99)
Triglycerides: 100 mg/dL (ref 0–149)
VLDL Cholesterol Cal: 20 mg/dL (ref 5–40)

## 2015-03-21 LAB — HEPATIC FUNCTION PANEL
ALT: 23 IU/L (ref 0–44)
AST: 29 IU/L (ref 0–40)
Albumin: 4.4 g/dL (ref 3.5–4.8)
Alkaline Phosphatase: 70 IU/L (ref 39–117)
Bilirubin Total: 0.5 mg/dL (ref 0.0–1.2)
Bilirubin, Direct: 0.14 mg/dL (ref 0.00–0.40)
Total Protein: 7 g/dL (ref 6.0–8.5)

## 2015-03-21 LAB — PSA: Prostate Specific Ag, Serum: 2.2 ng/mL (ref 0.0–4.0)

## 2015-03-21 LAB — MICROALBUMIN / CREATININE URINE RATIO
Creatinine, Urine: 91.1 mg/dL
MICROALB/CREAT RATIO: 15 mg/g creat (ref 0.0–30.0)
Microalbumin, Urine: 13.7 ug/mL

## 2015-03-21 LAB — HEMOGLOBIN A1C
Est. average glucose Bld gHb Est-mCnc: 128 mg/dL
Hgb A1c MFr Bld: 6.1 % — ABNORMAL HIGH (ref 4.8–5.6)

## 2015-05-16 ENCOUNTER — Encounter: Payer: Self-pay | Admitting: Family Medicine

## 2015-05-16 ENCOUNTER — Ambulatory Visit (INDEPENDENT_AMBULATORY_CARE_PROVIDER_SITE_OTHER): Payer: Medicare Other | Admitting: Family Medicine

## 2015-05-16 VITALS — BP 152/90 | Ht 66.0 in | Wt 195.8 lb

## 2015-05-16 DIAGNOSIS — I10 Essential (primary) hypertension: Secondary | ICD-10-CM

## 2015-05-16 MED ORDER — AMLODIPINE BESYLATE 5 MG PO TABS: 5.0000 mg | ORAL_TABLET | Freq: Every day | ORAL | Status: DC

## 2015-05-16 MED ORDER — OXYCODONE HCL 10 MG PO TABS: 10.0000 mg | ORAL_TABLET | Freq: Four times a day (QID) | ORAL | Status: DC | PRN

## 2015-05-16 NOTE — Patient Instructions (Addendum)
Andoni-   Amlodipine 5 mg, start 1/2 daily for the first week, the goal is 130/80 , if blood pressure not coming down enough with 1/2 tablet then increase to 1 tablet daily  Follow up here in 3 to 4 weeks to recheck blood pressure

## 2015-05-16 NOTE — Progress Notes (Signed)
   Subjective:    Patient ID: [name], male    DOB: [birth year], 74 y.o.   MRN: [id number]  HPI Patient arrives with c/o elevated blood pressure for a few weeks. Patient states that he's had some intermittent headaches over the past few days is also noticed blood pressure 150/90 range. He denies any chest tightness pressure pain or shortness of breath he relates moderate back pain joint pains also having some stress because his sister passed away weeks ago. Patient states that he uses oxycodone intermittently will need a refill.  Review of Systems  Constitutional: Negative for activity change, appetite change and fatigue.  HENT: Negative for congestion.   Respiratory: Negative for cough.   Cardiovascular: Negative for chest pain.  Gastrointestinal: Negative for abdominal pain.  Endocrine: Negative for polydipsia and polyphagia.  Neurological: Negative for weakness.  Psychiatric/Behavioral: Negative for confusion.       Objective:   Physical Exam  Constitutional: He appears well-nourished. No distress.  Cardiovascular: Normal rate, regular rhythm and normal heart sounds.   No murmur heard. Pulmonary/Chest: Effort normal and breath sounds normal. No respiratory distress.  Musculoskeletal: He exhibits no edema.  Lymphadenopathy:    He has no cervical adenopathy.  Neurological: He is alert.  Psychiatric: His behavior is normal.  Vitals reviewed.         Assessment & Plan:  Elevated blood pressure start amlodipine. 5 mg tablet. Use half tablet daily for the first 1-2 weeks if blood pressure is not at the 130/80 range increase it to a whole tablet. Follow-up in 3-4 weeks to recheck blood pressure follow-up sooner problems  Chronic back pain discomfort refill of oxycodone given patient uses it sparingly.

## 2015-05-30 DIAGNOSIS — R0602 Shortness of breath: Secondary | ICD-10-CM | POA: Diagnosis not present

## 2015-05-30 DIAGNOSIS — S32009K Unspecified fracture of unspecified lumbar vertebra, subsequent encounter for fracture with nonunion: Secondary | ICD-10-CM | POA: Diagnosis not present

## 2015-05-30 DIAGNOSIS — Z981 Arthrodesis status: Secondary | ICD-10-CM | POA: Diagnosis not present

## 2015-05-30 DIAGNOSIS — Z6831 Body mass index (BMI) 31.0-31.9, adult: Secondary | ICD-10-CM | POA: Diagnosis not present

## 2015-06-07 ENCOUNTER — Encounter: Payer: Self-pay | Admitting: Family Medicine

## 2015-06-07 ENCOUNTER — Ambulatory Visit (INDEPENDENT_AMBULATORY_CARE_PROVIDER_SITE_OTHER): Payer: PPO | Admitting: Family Medicine

## 2015-06-07 VITALS — BP 138/78 | Ht 66.0 in | Wt 197.5 lb

## 2015-06-07 DIAGNOSIS — I10 Essential (primary) hypertension: Secondary | ICD-10-CM

## 2015-06-07 MED ORDER — RAMIPRIL 10 MG PO CAPS: 10.0000 mg | ORAL_CAPSULE | Freq: Two times a day (BID) | ORAL | Status: DC

## 2015-06-07 MED ORDER — INDAPAMIDE 2.5 MG PO TABS: 2.5000 mg | ORAL_TABLET | Freq: Every day | ORAL | Status: DC

## 2015-06-07 MED ORDER — PRAVASTATIN SODIUM 40 MG PO TABS: 40.0000 mg | ORAL_TABLET | Freq: Every day | ORAL | Status: DC

## 2015-06-07 MED ORDER — PANTOPRAZOLE SODIUM 40 MG PO TBEC: DELAYED_RELEASE_TABLET | ORAL | Status: DC

## 2015-06-07 MED ORDER — DIAZEPAM 5 MG PO TABS
5.0000 mg | ORAL_TABLET | Freq: Two times a day (BID) | ORAL | Status: DC | PRN
Start: 2015-06-07 — End: 2016-08-13

## 2015-06-07 NOTE — Progress Notes (Signed)
   Subjective:    Patient ID: [name], male    DOB: [birth year], 75 y.o.   MRN: [id number]  Hypertension This is a chronic problem. The current episode started more than 1 year ago. Pertinent negatives include no chest pain. There are no compliance problems.    he tries to eat healthy and denies any chest pressure tightness or pain he states he's been under a tremendous amount of stress especially during the holidays his sister passed away plus also had some other stressors. He uses Valium occasionally to help him in the evening time when he is stressed he states he does not take that with the oxycodone he states he is taking the oxycodone occasionally  Patient in today for a 3 week follow up for hypertension. Patient states no concerns this visit. Stressed  Review of Systems  Constitutional: Negative for activity change, appetite change and fatigue.  HENT: Negative for congestion.   Respiratory: Negative for cough.   Cardiovascular: Negative for chest pain.  Gastrointestinal: Negative for abdominal pain.  Endocrine: Negative for polydipsia and polyphagia.  Neurological: Negative for weakness.  Psychiatric/Behavioral: Negative for confusion.       Objective:   Physical Exam  Constitutional: He appears well-nourished. No distress.  Cardiovascular: Normal rate, regular rhythm and normal heart sounds.   No murmur heard. Pulmonary/Chest: Effort normal and breath sounds normal. No respiratory distress.  Musculoskeletal: He exhibits no edema.  Lymphadenopathy:    He has no cervical adenopathy.  Neurological: He is alert.  Psychiatric: His behavior is normal.  Vitals reviewed.         Assessment & Plan:  Blood pressure-borderline control continue medications. Importance of the patient to walk as much as he can tolerate as well as eating healthy on entering his weight if high fevers chest congestion or other symptoms occur let us know. Continue current medications follow-up in 3  months

## 2015-06-10 DIAGNOSIS — H401130 Primary open-angle glaucoma, bilateral, stage unspecified: Secondary | ICD-10-CM | POA: Diagnosis not present

## 2015-06-10 DIAGNOSIS — H25012 Cortical age-related cataract, left eye: Secondary | ICD-10-CM | POA: Diagnosis not present

## 2015-06-10 DIAGNOSIS — H2512 Age-related nuclear cataract, left eye: Secondary | ICD-10-CM | POA: Diagnosis not present

## 2015-07-15 ENCOUNTER — Ambulatory Visit (HOSPITAL_COMMUNITY)
Admission: RE | Admit: 2015-07-15 | Discharge: 2015-07-15 | Disposition: A | Discharge status: Home / Self Care | Payer: PPO | Source: Ambulatory Visit | Attending: Nurse Practitioner | Admitting: Nurse Practitioner

## 2015-07-15 ENCOUNTER — Encounter (HOSPITAL_COMMUNITY): Payer: Self-pay | Admitting: Nurse Practitioner

## 2015-07-15 VITALS — BP 142/84 | HR 65 | Ht 66.0 in | Wt 194.4 lb

## 2015-07-15 DIAGNOSIS — Z7902 Long term (current) use of antithrombotics/antiplatelets: Secondary | ICD-10-CM | POA: Diagnosis not present

## 2015-07-15 DIAGNOSIS — Z86718 Personal history of other venous thrombosis and embolism: Secondary | ICD-10-CM | POA: Insufficient documentation

## 2015-07-15 DIAGNOSIS — K219 Gastro-esophageal reflux disease without esophagitis: Secondary | ICD-10-CM | POA: Insufficient documentation

## 2015-07-15 DIAGNOSIS — M549 Dorsalgia, unspecified: Secondary | ICD-10-CM | POA: Insufficient documentation

## 2015-07-15 DIAGNOSIS — I10 Essential (primary) hypertension: Secondary | ICD-10-CM | POA: Diagnosis not present

## 2015-07-15 DIAGNOSIS — G8929 Other chronic pain: Secondary | ICD-10-CM | POA: Insufficient documentation

## 2015-07-15 DIAGNOSIS — Z888 Allergy status to other drugs, medicaments and biological substances status: Secondary | ICD-10-CM | POA: Diagnosis not present

## 2015-07-15 DIAGNOSIS — Z86711 Personal history of pulmonary embolism: Secondary | ICD-10-CM | POA: Diagnosis not present

## 2015-07-15 DIAGNOSIS — Z833 Family history of diabetes mellitus: Secondary | ICD-10-CM | POA: Diagnosis not present

## 2015-07-15 DIAGNOSIS — I4891 Unspecified atrial fibrillation: Secondary | ICD-10-CM | POA: Diagnosis not present

## 2015-07-15 DIAGNOSIS — I480 Paroxysmal atrial fibrillation: Secondary | ICD-10-CM | POA: Insufficient documentation

## 2015-07-15 DIAGNOSIS — Z8249 Family history of ischemic heart disease and other diseases of the circulatory system: Secondary | ICD-10-CM | POA: Diagnosis not present

## 2015-07-15 DIAGNOSIS — Z79899 Other long term (current) drug therapy: Secondary | ICD-10-CM | POA: Diagnosis not present

## 2015-07-15 DIAGNOSIS — Z882 Allergy status to sulfonamides status: Secondary | ICD-10-CM | POA: Insufficient documentation

## 2015-07-15 DIAGNOSIS — E785 Hyperlipidemia, unspecified: Secondary | ICD-10-CM | POA: Insufficient documentation

## 2015-07-15 MED ORDER — DABIGATRAN ETEXILATE MESYLATE 150 MG PO CAPS: ORAL_CAPSULE | ORAL | Status: DC

## 2015-07-15 MED ORDER — METOPROLOL TARTRATE 25 MG PO TABS: ORAL_TABLET | ORAL | Status: DC

## 2015-07-15 NOTE — Progress Notes (Signed)
Patient ID: [name], male   DOB: 1940-06-28, 75 y.o.   MRN: [id number]     Primary Care Physician: [name], MD Referring Physician: [name] [name] is a 75 y.o. male with a h/o PAF. He is in the afib clinc for f/u. No afib other than around 12 hours on Christmas Day after hearing about the death of his sister. He continues to take pradaxa on a  regular basis.  Today, he denies symptoms of palpitations, chest pain, shortness of breath, orthopnea, PND, lower extremity edema, dizziness, presyncope, syncope, or neurologic sequela. The patient is tolerating medications without difficulties and is otherwise without complaint today.   Past Medical History  Diagnosis Date  . Paroxysmal atrial fibrillation (HCC)     takes Pradaxa daily, Chads2vasc score 2(07/11/14)  . History of hyperglycemia   . Hyperlipidemia     takes Pravastatin 3 times a week  . Chronic back pain     multiple back surgeries  . DVT (deep venous thrombosis) ([place]) 03/2007    left leg   . Cancer ([place])   . Glaucoma   . Carpal tunnel syndrome   . Pulmonary embolism ([place]) 2008  . Complication of anesthesia     pt states woke up during hemorroid surgery  . HTN (hypertension)     takes  Ramipril daily  . Pneumonia     many yrs ago  . Arthritis   . Joint pain   . Joint swelling   . Constipation     Metamucil every mornind  . GERD (gastroesophageal reflux disease)     takes Protonix daily  . History of colon polyps   . Urinary frequency   . Urinary urgency   . Nocturia   . Cataract     left eye  . History of MRSA infection 1992   Past Surgical History  Procedure Laterality Date  . Cervical and lumbar surgery    . Knee surgery      left  . Pvi  01/24/10    afib ablation  . Colonoscopy  04/24/2011    Procedure: COLONOSCOPY;  Surgeon: Rogene Houston, MD;  [facility];  Service: Endoscopy;  Laterality: N/A;  1200  . Esophagogastroduodenoscopy  03/10/2012    Procedure:  ESOPHAGOGASTRODUODENOSCOPY (EGD);  Surgeon: Rogene Houston, MD;  [facility]: [facility];  Service: Endoscopy;  Laterality: N/A;  325  . Cardiac catheterization  2008  . Cataract surgery Right   . Knee arthroscopy Right   . Hemorrhoidectomy with hemorrhoid banding    . Carpal tunnel release Right   . Lasik    . Hardware removal N/A 04/24/2014    Procedure: REMOVAL OF SACRAL INSTRUMENTATION WITH METREX;  Surgeon: Kristeen Miss, MD;  Location: [facility];  Service: Neurosurgery;  Laterality: N/A;    Current Outpatient Prescriptions  Medication Sig Dispense Refill  . acetaminophen (TYLENOL) 500 MG tablet Take 500 mg by mouth every 8 (eight) hours as needed for headache (pain). Reported on 06/07/2015    . amLODipine (NORVASC) 5 MG tablet Take 1 tablet (5 mg total) by mouth daily. 30 tablet 11  . dabigatran (PRADAXA) 150 MG CAPS capsule TAKE ONE CAPSULE BY MOUTH EVERY 12 HOURS 60 capsule 11  . indapamide (LOZOL) 2.5 MG tablet Take 1 tablet (2.5 mg total) by mouth daily. 90 tablet 3  . pantoprazole (PROTONIX) 40 MG tablet TAKE 1 TABLET (40 MG TOTAL) BY MOUTH DAILY. 90 tablet 5  .  pravastatin (PRAVACHOL) 40 MG tablet Take 1 tablet (40 mg total) by mouth daily. 90 tablet 3  . ramipril (ALTACE) 10 MG capsule Take 1 capsule (10 mg total) by mouth 2 (two) times daily. 180 capsule 2  . diazepam (VALIUM) 5 MG tablet Take 1 tablet (5 mg total) by mouth every 12 (twelve) hours as needed for anxiety. (Patient not taking: Reported on 07/15/2015) 30 tablet 1  . erythromycin ophthalmic ointment Place a 1/2 inch ribbon of ointment into the lower eyelid twice daily (Patient not taking: Reported on 07/15/2015) 3.5 g 0  . metoprolol tartrate (LOPRESSOR) 25 MG tablet Take 1 tablet as needed for onset of atrial fibrillation 6 tablet 1  . Oxycodone HCl 10 MG TABS Take 1 tablet (10 mg total) by mouth every 6 (six) hours as needed. For pain (Patient not taking: Reported on 07/15/2015) 120 tablet 0  . tamsulosin (FLOMAX)  0.4 MG CAPS capsule Take 1 capsule (0.4 mg total) by mouth daily. (Patient not taking: Reported on 06/07/2015) 90 capsule 1   No current facility-administered medications for this encounter.    Allergies  Allergen Reactions  . Lipitor [Atorvastatin]     Severe joint pain  . Sectral [Acebutolol Hcl]     Side effects  . Sulfonamide Derivatives Other (See Comments)    Runs blood pressure up  . Zocor [Simvastatin]     Joint pain    Social History   Social History  . Marital Status: Married    Spouse Name: N/A  . Number of Children: N/A  . Years of Education: N/A   Occupational History  . retired    Social History Main Topics  . Smoking status: Never Smoker   . Smokeless tobacco: Never Used  . Alcohol Use: Yes     Comment: wine nightly  . Drug Use: No  . Sexual Activity: Yes   Other Topics Concern  . Not on file   Social History Narrative    Family History  Problem Relation Age of Onset  . Hypertension    . Heart disease    . Hypertension Mother   . Transient ischemic attack Mother   . Heart disease Father   . Diabetes Brother   . Healthy Daughter   . Colon cancer Sister     ROS- All systems are reviewed and negative except as per the HPI above  Physical Exam: Filed Vitals:   07/15/15 1113  BP: 142/84  Pulse: 65  Height: 5\' 6"  (1.676 m)  Weight: 194 lb 6.4 oz (88.179 kg)    GEN- The patient is well appearing, alert and oriented x 3 today.   Head- normocephalic, atraumatic Eyes-  Sclera clear, conjunctiva pink Ears- hearing intact Oropharynx- clear Neck- supple, no JVP Lymph- no cervical lymphadenopathy Lungs- Clear to ausculation bilaterally, normal work of breathing Heart- Regular rate and rhythm, no murmurs, rubs or gallops, PMI not laterally displaced GI- soft, NT, ND, + BS Extremities- no clubbing, cyanosis, or edema MS- no significant deformity or atrophy Skin- no rash or lesion Psych- euthymic mood, full affect Neuro- strength and  sensation are intact  EKG- NSR with normal EKG, pr int 172 bpm, qrs int 90 ms, qtc 410 426 ms. Epic records reviewed  Assessment and Plan: 1. Afib  Very low burden  Will give rx for metoprolol tartrate 25 mg as needed for elevated heart rate Continue pradaxa 150 mg bid.   F/u in one year, sooner if needed  Butch Penny C. Kayleen Memos, ANP-C Afib  [facility] [address] [place], [place] [address] [phone]

## 2015-07-15 NOTE — Patient Instructions (Signed)
Your physician has recommended you make the following change in your medication:  1)Metoprolol 25mg  -- take 1 tablet as needed for afib

## 2015-07-29 ENCOUNTER — Encounter: Payer: Self-pay | Admitting: *Deleted

## 2015-09-10 ENCOUNTER — Ambulatory Visit (INDEPENDENT_AMBULATORY_CARE_PROVIDER_SITE_OTHER): Payer: PPO | Admitting: Family Medicine

## 2015-09-10 ENCOUNTER — Encounter: Payer: Self-pay | Admitting: Family Medicine

## 2015-09-10 VITALS — BP 134/74 | Ht 66.0 in | Wt 195.0 lb

## 2015-09-10 DIAGNOSIS — G8929 Other chronic pain: Secondary | ICD-10-CM | POA: Diagnosis not present

## 2015-09-10 DIAGNOSIS — M549 Dorsalgia, unspecified: Secondary | ICD-10-CM | POA: Diagnosis not present

## 2015-09-10 DIAGNOSIS — I480 Paroxysmal atrial fibrillation: Secondary | ICD-10-CM

## 2015-09-10 MED ORDER — OXYCODONE HCL 10 MG PO TABS: 10.0000 mg | ORAL_TABLET | Freq: Four times a day (QID) | ORAL | Status: DC | PRN

## 2015-09-10 NOTE — Progress Notes (Signed)
   Subjective:    Patient ID: [name], male    DOB: 01-Mar-1941, 75 y.o.   MRN: [id number]  HPIBAck pain and right shoulder pain. Ongoing. Taking oxycodone one bid and about 4 tylenol a day. Wants to know if something can be done for pain besides med.  Patient once to be on anti-inflammatories but he is also on a blood thinner because of history of atrial fibrillation. No high fevers no vomiting no wheezing or difficulty breathing recently PMH chronic back troubles in arthritis with severe pain does not abuse pain medication. Patient relates his atrial fib flared up right at the start of the year but his been in rhythm lately  Review of Systems Denies any constipation issues no chest tightness pressure pain shortness breath    Objective:   Physical Exam  Subjective discomfort in the neck and upper back lungs clear hearts regular no murmurs heard pulse normal      Assessment & Plan:  Comprehensive lab work when the patient follows up for wellness exam and follow-up on pain management in 3-4 months  Chronic pain-patient cannot take anti-inflammatories because of being on anticoagulant Oxycodone new use no greater than 4 per day most of time he only uses 2 per day 2 prescriptions were given this should last him a long time he is to keep medication and safe spot He is to follow-up if ongoing troubles otherwise see him in the summer

## 2015-09-16 ENCOUNTER — Encounter (INDEPENDENT_AMBULATORY_CARE_PROVIDER_SITE_OTHER): Payer: Self-pay | Admitting: *Deleted

## 2015-10-01 ENCOUNTER — Ambulatory Visit: Payer: PPO | Admitting: Family Medicine

## 2015-10-16 ENCOUNTER — Ambulatory Visit: Payer: PPO | Admitting: Family Medicine

## 2015-10-23 ENCOUNTER — Ambulatory Visit: Payer: PPO | Admitting: Family Medicine

## 2015-11-06 DIAGNOSIS — D485 Neoplasm of uncertain behavior of skin: Secondary | ICD-10-CM | POA: Diagnosis not present

## 2015-11-06 DIAGNOSIS — D225 Melanocytic nevi of trunk: Secondary | ICD-10-CM | POA: Diagnosis not present

## 2015-11-06 DIAGNOSIS — L814 Other melanin hyperpigmentation: Secondary | ICD-10-CM | POA: Diagnosis not present

## 2015-11-06 DIAGNOSIS — C44519 Basal cell carcinoma of skin of other part of trunk: Secondary | ICD-10-CM | POA: Diagnosis not present

## 2015-11-06 DIAGNOSIS — X32XXXD Exposure to sunlight, subsequent encounter: Secondary | ICD-10-CM | POA: Diagnosis not present

## 2015-11-06 DIAGNOSIS — L570 Actinic keratosis: Secondary | ICD-10-CM | POA: Diagnosis not present

## 2015-12-05 ENCOUNTER — Ambulatory Visit (INDEPENDENT_AMBULATORY_CARE_PROVIDER_SITE_OTHER): Payer: PPO | Admitting: Internal Medicine

## 2015-12-05 ENCOUNTER — Encounter (INDEPENDENT_AMBULATORY_CARE_PROVIDER_SITE_OTHER): Payer: Self-pay | Admitting: Internal Medicine

## 2015-12-05 VITALS — BP 120/65 | HR 64 | Temp 98.1°F | Ht 66.0 in | Wt 193.1 lb

## 2015-12-05 DIAGNOSIS — K2100 Gastro-esophageal reflux disease with esophagitis, without bleeding: Secondary | ICD-10-CM

## 2015-12-05 MED ORDER — PANTOPRAZOLE SODIUM 40 MG PO TBEC
DELAYED_RELEASE_TABLET | ORAL | Status: DC
Start: 2015-12-05 — End: 2017-02-05

## 2015-12-05 NOTE — Patient Instructions (Addendum)
Continue Protonix daily. GERD diet given to diet.

## 2015-12-05 NOTE — Progress Notes (Signed)
Subjective:    Patient ID: [name], male    DOB: 05/17/1941, 75 y.o.   MRN: [id number]  HPI  HPI Here today for f/u of his GERD. Weight in July of 2016 was 184. Today his weight is 193.9. He has gained 10 pounds. He tells me if he eats fried food it will wake him up at night. He has been taking the Protonix as needed. His appetite has remained good. [place]  Hx of atrial fib and maintained on Pradaxa. He c/o left ankle pain. Hx of osteomyelitis.  Family hx of colon cancer in a sister. Due for colonoscopy in December.    04/24/2011 Colonoscopy  Indications: Patient is 75 year old Caucasian male who is here for screening colonoscopy. Family history is positive for colon carcinoma and a sister.     Findings:  Prep excellent. Small polyp ablated via cold biopsy from transverse colon. Two small polyps ablated via cold biopsy from splenic flexure and submitted in one container. Few small diverticula at sigmoid colon.  One polyp was tubular adenoma, 2 others non-adenomatous. Next colonoscopy in 5 years.   03/10/2012 EGD: Impression:  Mild changes of reflux esophagitis limited to GE junction which is serrated. Biopsy taken to rule out short segment Barrett's esophagus.  Small sliding hiatal hernia.  Pyloric channel inflammation without stricture or ulcer.  Biopsy:  Notes Recorded by Rogene Houston, MD on 03/21/2012 at 2:04 PM H. Pylori serology is negative. Patient not doing any better with pantoprazole twice a day according to his wife. Will proceed with solid phase GES Report to PCP  04/07/2012: Gastric emptying normal.  Conclusion: No evidence suggesting gastric paresis or gastric outlet obstruction.       Review of Systems Past Medical History  Diagnosis Date  . Paroxysmal atrial fibrillation (HCC)     takes Pradaxa daily, Chads2vasc score 2(07/11/14)  . History of hyperglycemia   . Hyperlipidemia     takes  Pravastatin 3 times a week  . Chronic back pain     multiple back surgeries  . DVT (deep venous thrombosis) ([place]) 03/2007    left leg   . Cancer ([place])   . Glaucoma   . Carpal tunnel syndrome   . Pulmonary embolism ([place]) 2008  . Complication of anesthesia     pt states woke up during hemorroid surgery  . HTN (hypertension)     takes  Ramipril daily  . Pneumonia     many yrs ago  . Arthritis   . Joint pain   . Joint swelling   . Constipation     Metamucil every mornind  . GERD (gastroesophageal reflux disease)     takes Protonix daily  . History of colon polyps   . Urinary frequency   . Urinary urgency   . Nocturia   . Cataract     left eye  . History of MRSA infection 1992    Past Surgical History  Procedure Laterality Date  . Cervical and lumbar surgery    . Knee surgery      left  . Pvi  01/24/10    afib ablation  . Colonoscopy  04/24/2011    Procedure: COLONOSCOPY;  Surgeon: Rogene Houston, MD;  [facility];  Service: Endoscopy;  Laterality: N/A;  1200  . Esophagogastroduodenoscopy  03/10/2012    Procedure: ESOPHAGOGASTRODUODENOSCOPY (EGD);  Surgeon: Rogene Houston, MD;  [facility];  Service: Endoscopy;  Laterality:  N/A;  325  . Cardiac catheterization  2008  . Cataract surgery Right   . Knee arthroscopy Right   . Hemorrhoidectomy with hemorrhoid banding    . Carpal tunnel release Right   . Lasik    . Hardware removal N/A 04/24/2014    Procedure: REMOVAL OF SACRAL INSTRUMENTATION WITH METREX;  Surgeon: Kristeen Miss, MD;  Location: [facility];  Service: Neurosurgery;  Laterality: N/A;    Allergies  Allergen Reactions  . Lipitor [Atorvastatin]     Severe joint pain  . Sectral [Acebutolol Hcl]     Side effects  . Sulfonamide Derivatives Other (See Comments)    Runs blood pressure up  . Zocor [Simvastatin]     Joint pain    Current Outpatient Prescriptions on File Prior to Visit  Medication Sig Dispense Refill  . acetaminophen  (TYLENOL) 500 MG tablet Take 500 mg by mouth every 8 (eight) hours as needed for headache (pain). Reported on 06/07/2015    . amLODipine (NORVASC) 5 MG tablet Take 1 tablet (5 mg total) by mouth daily. 30 tablet 11  . dabigatran (PRADAXA) 150 MG CAPS capsule TAKE ONE CAPSULE BY MOUTH EVERY 12 HOURS 60 capsule 11  . indapamide (LOZOL) 2.5 MG tablet Take 1 tablet (2.5 mg total) by mouth daily. 90 tablet 3  . Oxycodone HCl 10 MG TABS Take 1 tablet (10 mg total) by mouth every 6 (six) hours as needed. For pain 120 tablet 0  . pantoprazole (PROTONIX) 40 MG tablet TAKE 1 TABLET (40 MG TOTAL) BY MOUTH DAILY. 90 tablet 5  . pravastatin (PRAVACHOL) 40 MG tablet Take 1 tablet (40 mg total) by mouth daily. 90 tablet 3  . ramipril (ALTACE) 10 MG capsule Take 1 capsule (10 mg total) by mouth 2 (two) times daily. 180 capsule 2  . diazepam (VALIUM) 5 MG tablet Take 1 tablet (5 mg total) by mouth every 12 (twelve) hours as needed for anxiety. (Patient not taking: Reported on 12/05/2015) 30 tablet 1  . erythromycin ophthalmic ointment Place a 1/2 inch ribbon of ointment into the lower eyelid twice daily (Patient not taking: Reported on 12/05/2015) 3.5 g 0  . metoprolol tartrate (LOPRESSOR) 25 MG tablet Take 1 tablet as needed for onset of atrial fibrillation (Patient not taking: Reported on 12/05/2015) 6 tablet 1   No current facility-administered medications on file prior to visit.        Objective:   Physical ExamBlood pressure 120/65, pulse 64, temperature 98.1 F (36.7 C), height 5\' 6"  (1.676 m), weight 193 lb 1.6 oz (87.59 kg).  Alert and oriented. Skin warm and dry. Oral mucosa is moist.   . Sclera anicteric, conjunctivae is pink. Thyroid not enlarged. No cervical lymphadenopathy. Lungs clear. Heart regular rate and rhythm.  Abdomen is soft. Bowel sounds are positive. No hepatomegaly. No abdominal masses felt. No tenderness.  No edema to lower extremities. Patient is alert and oriented.       Assessment &  Plan:  GERD. He will continue the Protonix daily.  OV in 1 year. Family hx of colon cancer in a sister. Recall colonoscopy in December.

## 2015-12-09 DIAGNOSIS — L814 Other melanin hyperpigmentation: Secondary | ICD-10-CM | POA: Diagnosis not present

## 2015-12-09 DIAGNOSIS — D485 Neoplasm of uncertain behavior of skin: Secondary | ICD-10-CM | POA: Diagnosis not present

## 2015-12-09 DIAGNOSIS — Z08 Encounter for follow-up examination after completed treatment for malignant neoplasm: Secondary | ICD-10-CM | POA: Diagnosis not present

## 2015-12-09 DIAGNOSIS — Z85828 Personal history of other malignant neoplasm of skin: Secondary | ICD-10-CM | POA: Diagnosis not present

## 2015-12-12 DIAGNOSIS — H179 Unspecified corneal scar and opacity: Secondary | ICD-10-CM | POA: Diagnosis not present

## 2015-12-12 DIAGNOSIS — H02839 Dermatochalasis of unspecified eye, unspecified eyelid: Secondary | ICD-10-CM | POA: Diagnosis not present

## 2015-12-12 DIAGNOSIS — H401130 Primary open-angle glaucoma, bilateral, stage unspecified: Secondary | ICD-10-CM | POA: Diagnosis not present

## 2015-12-12 DIAGNOSIS — H25012 Cortical age-related cataract, left eye: Secondary | ICD-10-CM | POA: Diagnosis not present

## 2015-12-26 DIAGNOSIS — C44319 Basal cell carcinoma of skin of other parts of face: Secondary | ICD-10-CM | POA: Diagnosis not present

## 2015-12-26 DIAGNOSIS — L111 Transient acantholytic dermatosis [Grover]: Secondary | ICD-10-CM | POA: Diagnosis not present

## 2016-01-06 ENCOUNTER — Telehealth: Payer: Self-pay | Admitting: Family Medicine

## 2016-01-06 NOTE — Telephone Encounter (Signed)
Pt is requesting a refill on his Oxycodone HCl 10 MG TABS

## 2016-01-07 ENCOUNTER — Other Ambulatory Visit: Payer: Self-pay | Admitting: Family Medicine

## 2016-01-07 MED ORDER — OXYCODONE HCL 10 MG PO TABS: 10.0000 mg | ORAL_TABLET | Freq: Four times a day (QID) | ORAL | 0 refills | Status: DC | PRN

## 2016-01-07 NOTE — Progress Notes (Signed)
Pt needed refill- partial rx given needs follow up ov- wife will inform him

## 2016-02-11 ENCOUNTER — Telehealth: Payer: Self-pay | Admitting: Family Medicine

## 2016-02-11 NOTE — Telephone Encounter (Signed)
I can address the arthritic discomfort he is having Thursday 4 PM

## 2016-02-11 NOTE — Telephone Encounter (Signed)
Appt made, patient notified

## 2016-02-11 NOTE — Telephone Encounter (Signed)
Patient is having terrible arthritis in his joints.  [place] the [place], but he is hoping he could be worked in to Dr. Bary Leriche schedule on Thursday this week after 3 pm.

## 2016-02-13 ENCOUNTER — Encounter: Payer: Self-pay | Admitting: Family Medicine

## 2016-02-13 ENCOUNTER — Ambulatory Visit (INDEPENDENT_AMBULATORY_CARE_PROVIDER_SITE_OTHER): Payer: PPO | Admitting: Family Medicine

## 2016-02-13 VITALS — BP 120/70 | Ht 66.0 in | Wt 193.4 lb

## 2016-02-13 DIAGNOSIS — M1990 Unspecified osteoarthritis, unspecified site: Secondary | ICD-10-CM

## 2016-02-13 DIAGNOSIS — Z23 Encounter for immunization: Secondary | ICD-10-CM | POA: Diagnosis not present

## 2016-02-13 DIAGNOSIS — R7303 Prediabetes: Secondary | ICD-10-CM

## 2016-02-13 MED ORDER — OXYCODONE HCL 10 MG PO TABS: ORAL_TABLET | ORAL | 0 refills | Status: DC

## 2016-02-13 NOTE — Progress Notes (Signed)
   Subjective:    Patient ID: [name], male    DOB: [birth year], 75 y.o.   MRN: [id number]  HPI Patient in today for arthritic pain to back.  Significant arthritis in the hands knees elbows back causes a lot of pain uses oxycodone twice a day but states he could use it 3 or more times per day does not want to get hooked on it but at the same time he does not have any symptoms of feeling affected by other than helping with his pain and function he is using Tylenol some States no other concerns this visit.    Review of Systems See above no redness no swelling. No fever chills    Objective:   Physical Exam Lungs clear hearts regular subjected discomfort in the elbows wrists hands knees and ankles low back       Assessment & Plan:  Significant osteoarthritis do lab tests to rule out the possibility of inflammatory arthritis.  Chronic pain oxycodone may use up to 4 times per day more than likely patient will use 2 or 3 times per day 3 prescriptions given follow-up in approximately 3-4 months sooner problems

## 2016-02-14 LAB — ANA: Anti Nuclear Antibody(ANA): POSITIVE — AB

## 2016-02-14 LAB — RHEUMATOID FACTOR: Rhuematoid fact SerPl-aCnc: <10 IU/mL (ref 0.0–13.9)

## 2016-02-14 LAB — HEMOGLOBIN A1C
Est. average glucose Bld gHb Est-mCnc: 126 mg/dL
Hgb A1c MFr Bld: 6 % — ABNORMAL HIGH (ref 4.8–5.6)

## 2016-02-14 LAB — C-REACTIVE PROTEIN: CRP: 0.9 mg/L (ref 0.0–4.9)

## 2016-02-14 LAB — SEDIMENTATION RATE: Sed Rate: 2 mm/hr (ref 0–30)

## 2016-02-26 ENCOUNTER — Other Ambulatory Visit: Payer: Self-pay | Admitting: Family Medicine

## 2016-03-30 DIAGNOSIS — L905 Scar conditions and fibrosis of skin: Secondary | ICD-10-CM | POA: Diagnosis not present

## 2016-03-30 DIAGNOSIS — Z08 Encounter for follow-up examination after completed treatment for malignant neoplasm: Secondary | ICD-10-CM | POA: Diagnosis not present

## 2016-03-30 DIAGNOSIS — X32XXXD Exposure to sunlight, subsequent encounter: Secondary | ICD-10-CM | POA: Diagnosis not present

## 2016-03-30 DIAGNOSIS — Z85828 Personal history of other malignant neoplasm of skin: Secondary | ICD-10-CM | POA: Diagnosis not present

## 2016-03-30 DIAGNOSIS — L570 Actinic keratosis: Secondary | ICD-10-CM | POA: Diagnosis not present

## 2016-04-14 ENCOUNTER — Ambulatory Visit (INDEPENDENT_AMBULATORY_CARE_PROVIDER_SITE_OTHER): Payer: PPO | Admitting: Family Medicine

## 2016-04-14 ENCOUNTER — Encounter: Payer: Self-pay | Admitting: Family Medicine

## 2016-04-14 VITALS — BP 142/92 | Ht 66.0 in | Wt 190.4 lb

## 2016-04-14 DIAGNOSIS — S46211A Strain of muscle, fascia and tendon of other parts of biceps, right arm, initial encounter: Secondary | ICD-10-CM | POA: Diagnosis not present

## 2016-04-14 MED ORDER — PRAVASTATIN SODIUM 40 MG PO TABS: 40.0000 mg | ORAL_TABLET | Freq: Every day | ORAL | 3 refills | Status: DC

## 2016-04-14 MED ORDER — AMLODIPINE BESYLATE 5 MG PO TABS: 5.0000 mg | ORAL_TABLET | Freq: Every day | ORAL | 3 refills | Status: DC

## 2016-04-14 MED ORDER — AMLODIPINE BESYLATE 5 MG PO TABS: 5.0000 mg | ORAL_TABLET | Freq: Every day | ORAL | 1 refills | Status: DC

## 2016-04-14 MED ORDER — RAMIPRIL 10 MG PO CAPS: 10.0000 mg | ORAL_CAPSULE | Freq: Two times a day (BID) | ORAL | 1 refills | Status: DC

## 2016-04-14 MED ORDER — INDAPAMIDE 2.5 MG PO TABS: 2.5000 mg | ORAL_TABLET | Freq: Every day | ORAL | 3 refills | Status: DC

## 2016-04-14 NOTE — Progress Notes (Signed)
   Subjective:    Patient ID: [name], male    DOB: [birth year], 75 y.o.   MRN: [id number]  HPI Patient with c/o right shoulder pain for a few weeks. Patient states it was getting better but he threw a rock and it felt like something ripped-got large knot in deltoid area. Patient relates that he is lifted several different items over the past few weeks it caused shoulder pain then some arm pain and stiffness and bruising in the biceps region denies any hand numbness tingling or weakness.  Review of Systems See above.    Objective:   Physical Exam  On exam neck is normal clavicle normal trapezius normal shoulder mild rotator strain noted I doubt rotator rupture patient does have a large bicep toward the distal end of which indicates a proximal biceps tendon partial tear/rupture I believe one head of the biceps is ruptured the other is intact. Patient able to flex okay. We did discuss in detail how at a much younger age surgery would be a recommended pathway but at his current age physical therapy and strengthening exercises would be best.      Assessment & Plan:  Biceps rupture-conservative measures stretching exercises strengthening excises if progressive troubles referral to orthopedics I doubt that they would do surgery at his age. If he starts having trouble he will let us know we'll set up referral.

## 2016-05-22 ENCOUNTER — Encounter (INDEPENDENT_AMBULATORY_CARE_PROVIDER_SITE_OTHER): Payer: Self-pay | Admitting: *Deleted

## 2016-05-25 ENCOUNTER — Telehealth: Payer: Self-pay | Admitting: Family Medicine

## 2016-05-25 NOTE — Telephone Encounter (Signed)
Patient has had shoulder pain for a month and now has a knot and painful wanting something this week if possible but not on thursday or Friday.

## 2016-05-25 NOTE — Telephone Encounter (Signed)
Patient c/o right shoulder injury approx 1 month ago.  He states he still has "baseball size" knot, no worse, just not better.  He states he can lift coffee cup if bending arm, but no more.  He states he can lift 5 gal bucket if arm is straight.  Patient reports pain under control with APAP.  He states if you plan on sending to specialist he would like an appointment with you before that is done.  I advised to con't APAP and use ice/heat for 10 minutes Q2H.  He voiced understanding and agreed with plan.  Please advise further recommendations for patient.

## 2016-05-25 NOTE — Telephone Encounter (Signed)
Discussed with pt. Pt verbalized understanding. Transferred to front to schedule office visit.

## 2016-05-25 NOTE — Telephone Encounter (Signed)
Please have the patient follow-up if he feels like it is terrible I can see him later this week otherwise within the next 10 days there is a strong possibility we will be referring him to a specialist

## 2016-06-02 ENCOUNTER — Ambulatory Visit (INDEPENDENT_AMBULATORY_CARE_PROVIDER_SITE_OTHER): Payer: PPO | Admitting: Family Medicine

## 2016-06-02 VITALS — BP 122/82 | Wt 189.2 lb

## 2016-06-02 DIAGNOSIS — G8929 Other chronic pain: Secondary | ICD-10-CM | POA: Diagnosis not present

## 2016-06-02 DIAGNOSIS — M549 Dorsalgia, unspecified: Secondary | ICD-10-CM | POA: Diagnosis not present

## 2016-06-02 DIAGNOSIS — I10 Essential (primary) hypertension: Secondary | ICD-10-CM

## 2016-06-02 DIAGNOSIS — M7581 Other shoulder lesions, right shoulder: Secondary | ICD-10-CM

## 2016-06-02 DIAGNOSIS — R7303 Prediabetes: Secondary | ICD-10-CM

## 2016-06-02 DIAGNOSIS — E784 Other hyperlipidemia: Secondary | ICD-10-CM

## 2016-06-02 DIAGNOSIS — E7849 Other hyperlipidemia: Secondary | ICD-10-CM

## 2016-06-02 DIAGNOSIS — S46211D Strain of muscle, fascia and tendon of other parts of biceps, right arm, subsequent encounter: Secondary | ICD-10-CM | POA: Diagnosis not present

## 2016-06-02 MED ORDER — OXYCODONE HCL 10 MG PO TABS: ORAL_TABLET | ORAL | 0 refills | Status: DC

## 2016-06-02 NOTE — Progress Notes (Signed)
   Subjective:    Patient ID: [name], male    DOB: 15-Jul-1940, 76 y.o.   MRN: [id number]  HPI  Patient in office today c/o right shoulder pain.  He states he is having pain with raising arm and trouble driving. He c/o pain at night, position change and paine medication help some.  He states he is concerned that "this" will turn to something more serious. Blood pressure medicine takes on a regular basis does good job with watching diet Cholesterol medicine takes on a regular basis as a good job with this Chronic pain takes pain medicine anywhere from 3-4 times per day sometimes twice a day needs prescriptions on his pain medicine. He relates a lot of pain in the shoulder region decreased range of motion of it. Denies any new injury. Headed biceps tendon rupture he has had ongoing shoulder pain though for years and it's got worse over the past several months. Chronic low back pain pain medicine does good job for this Review of Systems    denies any chest pressure tightness pain shortness breath Objective:   Physical Exam  Limited range of motion of the right shoulder lungs are clear hearts the rate is controlled patient has atrial fibrillation blood pressure good  Patient is due blood work because of HTN hyperlipidemia and screening. Because of his low back pain we will check PSA to make sure that that is not going up    Assessment & Plan:  HTN good control Atrial fib on Pradaxa via cardiology Biceps ruptured tendon along with probable rotator cuff in the right shoulder referral to orthopedics  Chronic pain and discomfort 3 prescriptions are given he takes pain medicine anywhere from 2-3 times a day sometimes four denies abusing it.

## 2016-06-03 LAB — BASIC METABOLIC PANEL
BUN/Creatinine Ratio: 16 (ref 10–24)
BUN: 18 mg/dL (ref 8–27)
CO2: 23 mmol/L (ref 18–29)
Calcium: 10 mg/dL (ref 8.6–10.2)
Chloride: 98 mmol/L (ref 96–106)
Creatinine, Ser: 1.1 mg/dL (ref 0.76–1.27)
GFR calc Af Amer: 76 mL/min/{1.73_m2} (ref >59)
GFR calc non Af Amer: 65 mL/min/{1.73_m2} (ref >59)
Glucose: 111 mg/dL — ABNORMAL HIGH (ref 65–99)
Potassium: 4.2 mmol/L (ref 3.5–5.2)
Sodium: 142 mmol/L (ref 134–144)

## 2016-06-03 LAB — LIPID PANEL
Chol/HDL Ratio: 4.2 ratio units (ref 0.0–5.0)
Cholesterol, Total: 180 mg/dL (ref 100–199)
HDL: 43 mg/dL (ref >39)
LDL Calculated: 104 mg/dL — ABNORMAL HIGH (ref 0–99)
Triglycerides: 167 mg/dL — ABNORMAL HIGH (ref 0–149)
VLDL Cholesterol Cal: 33 mg/dL (ref 5–40)

## 2016-06-03 LAB — HEPATIC FUNCTION PANEL
ALT: 27 IU/L (ref 0–44)
AST: 42 IU/L — ABNORMAL HIGH (ref 0–40)
Albumin: 5 g/dL — ABNORMAL HIGH (ref 3.5–4.8)
Alkaline Phosphatase: 71 IU/L (ref 39–117)
Bilirubin Total: 0.6 mg/dL (ref 0.0–1.2)
Bilirubin, Direct: 0.16 mg/dL (ref 0.00–0.40)
Total Protein: 7.9 g/dL (ref 6.0–8.5)

## 2016-06-03 LAB — PSA: Prostate Specific Ag, Serum: 2.3 ng/mL (ref 0.0–4.0)

## 2016-06-05 ENCOUNTER — Telehealth: Payer: Self-pay | Admitting: Pediatrics

## 2016-06-05 DIAGNOSIS — E7849 Other hyperlipidemia: Secondary | ICD-10-CM

## 2016-06-05 MED ORDER — PRAVASTATIN SODIUM 80 MG PO TABS: 80.0000 mg | ORAL_TABLET | Freq: Every day | ORAL | 2 refills | Status: DC

## 2016-06-05 NOTE — Telephone Encounter (Signed)
-----   Message from Kathyrn Drown, MD sent at 06/03/2016  5:06 PM EST ----- Lab work shows good kidney functions. Also shows slight elevation of sugar watch diet closely stay active Bad cholesterol is mildly elevated ideally we would like to see the LDL near 70. The patient currently is taking pravastatin 40 mg daily. He seems to be tolerating it well. I recommend bumping up the dose to 80 mg daily he may have 90 day supply with 2 refills repeat lipid liver profile in 8-10 weeks. Let the patient know that if he does not tolerate the higher dose of pravastatin he can simply let us know and we will go back to the previous dose.

## 2016-06-05 NOTE — Telephone Encounter (Signed)
See result note. Increased dose of pravastatin sent to pharmacy. I advised patient ok to take 2 40 mg tabs until gone.  I entered labs. Pt aware to have drawn in 8-10 weeks. He voiced understanding of instructions and recommendations.

## 2016-06-15 ENCOUNTER — Encounter (INDEPENDENT_AMBULATORY_CARE_PROVIDER_SITE_OTHER): Payer: Self-pay | Admitting: *Deleted

## 2016-06-15 ENCOUNTER — Telehealth (INDEPENDENT_AMBULATORY_CARE_PROVIDER_SITE_OTHER): Payer: Self-pay | Admitting: *Deleted

## 2016-06-15 DIAGNOSIS — S46211A Strain of muscle, fascia and tendon of other parts of biceps, right arm, initial encounter: Secondary | ICD-10-CM | POA: Diagnosis not present

## 2016-06-15 MED ORDER — PEG 3350-KCL-NA BICARB-NACL 420 G PO SOLR: 4000.0000 mL | Freq: Once | ORAL | 0 refills | Status: AC

## 2016-06-15 NOTE — Telephone Encounter (Signed)
Patient needs trilyte 

## 2016-06-16 ENCOUNTER — Other Ambulatory Visit (INDEPENDENT_AMBULATORY_CARE_PROVIDER_SITE_OTHER): Payer: Self-pay | Admitting: *Deleted

## 2016-06-16 DIAGNOSIS — Z86010 Personal history of colonic polyps: Secondary | ICD-10-CM

## 2016-06-22 ENCOUNTER — Telehealth: Payer: Self-pay | Admitting: Internal Medicine

## 2016-06-22 NOTE — Telephone Encounter (Signed)
New Message  Request for surgical clearance:  1. What type of surgery is being performed? colonoscopy  2. When is this surgery scheduled? March 29  3. Are there any medications that need to be held prior to surgery and how long? Pradaxa 150mg  2 days Prior   4. Name of physician performing surgery? Dr. Laural Golden  5. What is your office phone and fax number? [phone] fax [phone]

## 2016-06-23 NOTE — Telephone Encounter (Signed)
Per protocol ok to hold Pradaxa for 2 days prior to procedure for Afib with Crcl >42mL/min, CHADS <4, and no hx stroke/DVT.

## 2016-06-23 NOTE — Telephone Encounter (Signed)
Faxed to [facility] @ [phone].

## 2016-07-08 DIAGNOSIS — L905 Scar conditions and fibrosis of skin: Secondary | ICD-10-CM | POA: Diagnosis not present

## 2016-07-08 DIAGNOSIS — X32XXXD Exposure to sunlight, subsequent encounter: Secondary | ICD-10-CM | POA: Diagnosis not present

## 2016-07-08 DIAGNOSIS — L570 Actinic keratosis: Secondary | ICD-10-CM | POA: Diagnosis not present

## 2016-07-13 DIAGNOSIS — S46211D Strain of muscle, fascia and tendon of other parts of biceps, right arm, subsequent encounter: Secondary | ICD-10-CM | POA: Diagnosis not present

## 2016-07-15 ENCOUNTER — Telehealth (INDEPENDENT_AMBULATORY_CARE_PROVIDER_SITE_OTHER): Payer: Self-pay | Admitting: *Deleted

## 2016-07-15 ENCOUNTER — Encounter (HOSPITAL_COMMUNITY): Payer: Self-pay | Admitting: Nurse Practitioner

## 2016-07-15 ENCOUNTER — Ambulatory Visit (HOSPITAL_COMMUNITY)
Admission: RE | Admit: 2016-07-15 | Discharge: 2016-07-15 | Disposition: A | Discharge status: Home / Self Care | Payer: PPO | Source: Ambulatory Visit | Attending: Nurse Practitioner | Admitting: Nurse Practitioner

## 2016-07-15 VITALS — BP 132/70 | HR 63 | Ht 66.0 in | Wt 191.6 lb

## 2016-07-15 DIAGNOSIS — I480 Paroxysmal atrial fibrillation: Secondary | ICD-10-CM | POA: Diagnosis not present

## 2016-07-15 DIAGNOSIS — Z8249 Family history of ischemic heart disease and other diseases of the circulatory system: Secondary | ICD-10-CM | POA: Diagnosis not present

## 2016-07-15 DIAGNOSIS — Z882 Allergy status to sulfonamides status: Secondary | ICD-10-CM | POA: Insufficient documentation

## 2016-07-15 DIAGNOSIS — Z7901 Long term (current) use of anticoagulants: Secondary | ICD-10-CM | POA: Insufficient documentation

## 2016-07-15 DIAGNOSIS — I10 Essential (primary) hypertension: Secondary | ICD-10-CM | POA: Insufficient documentation

## 2016-07-15 DIAGNOSIS — Z833 Family history of diabetes mellitus: Secondary | ICD-10-CM | POA: Diagnosis not present

## 2016-07-15 DIAGNOSIS — Z79899 Other long term (current) drug therapy: Secondary | ICD-10-CM | POA: Diagnosis not present

## 2016-07-15 DIAGNOSIS — Z9889 Other specified postprocedural states: Secondary | ICD-10-CM | POA: Insufficient documentation

## 2016-07-15 DIAGNOSIS — Z8614 Personal history of Methicillin resistant Staphylococcus aureus infection: Secondary | ICD-10-CM | POA: Diagnosis not present

## 2016-07-15 DIAGNOSIS — I4891 Unspecified atrial fibrillation: Secondary | ICD-10-CM | POA: Diagnosis not present

## 2016-07-15 MED ORDER — DABIGATRAN ETEXILATE MESYLATE 150 MG PO CAPS: ORAL_CAPSULE | ORAL | 11 refills | Status: DC

## 2016-07-15 NOTE — Telephone Encounter (Signed)
agree

## 2016-07-15 NOTE — Progress Notes (Signed)
Patient ID: [name], male   DOB: Apr 20, 1941, 76 y.o.   MRN: [id number]     Primary Care Physician: [name] Lange, MD Referring Physician: [name] [name] is a 76 y.o. male with a h/o PAF. He is in the afib clinc for f/u. No afib other than around 12 hours on Christmas Day after hearing about the death of his sister. He continues to take pradaxa on a  regular basis.  F/u 07/15/16. He reports just a few flutters now and then. He remains very active and actually had a rupture of his bicep tendon last September after digging up and pitching a rock about the size of a "coconut.". Surgery was not recommended due to his age and it has repaired to some degree with time. He continues on pradaxa with last bmet drawn 06/02/16 with creatinine of 1.10. No bleeding issues.   Today, he denies symptoms of palpitations, chest pain, shortness of breath, orthopnea, PND, lower extremity edema, dizziness, presyncope, syncope, or neurologic sequela. The patient is tolerating medications without difficulties and is otherwise without complaint today.   Past Medical History:  Diagnosis Date  . Arthritis   . Cancer ([place])   . Carpal tunnel syndrome   . Cataract    left eye  . Chronic back pain    multiple back surgeries  . Complication of anesthesia    pt states woke up during hemorroid surgery  . Constipation    Metamucil every mornind  . DVT (deep venous thrombosis) ([place]) 03/2007   left leg   . GERD (gastroesophageal reflux disease)    takes Protonix daily  . Glaucoma   . History of colon polyps   . History of hyperglycemia   . History of MRSA infection 1992  . HTN (hypertension)    takes  Ramipril daily  . Hyperlipidemia    takes Pravastatin 3 times a week  . Joint pain   . Joint swelling   . Nocturia   . Paroxysmal atrial fibrillation (H[facility])    takes Pradaxa daily, Chads2vasc score 2(07/11/14)  . Pneumonia    many yrs ago  . Pulmonary embolism ([place]) 2008  . Urinary frequency   .  Urinary urgency    Past Surgical History:  Procedure Laterality Date  . CARDIAC CATHETERIZATION  2008  . CARPAL TUNNEL RELEASE Right   . cataract surgery Right   . cervical and lumbar surgery    . COLONOSCOPY  04/24/2011   Procedure: COLONOSCOPY;  Surgeon: [name] Houston, MD;  [facility];  Service: Endoscopy;  Laterality: [name];  1200  . ESOPHAGOGASTRODUODENOSCOPY  03/10/2012   Procedure: ESOPHAGOGASTRODUODENOSCOPY (EGD);  Surgeon: [name] Houston, MD;  [facility];  Service: Endoscopy;  Laterality: [name];  325  . HARDWARE REMOVAL [name] 04/24/2014   Procedure: REMOVAL OF SACRAL INSTRUMENTATION WITH [place];  Surgeon: [name] Miss, MD;  Location: [facility];  Service: Neurosurgery;  Laterality: [name];  . HEMORRHOIDECTOMY WITH HEMORRHOID BANDING    . KNEE ARTHROSCOPY Right   . KNEE SURGERY     left  . LASIK    . PVI  01/24/10   afib ablation    Current Outpatient Prescriptions  Medication Sig Dispense Refill  . acetaminophen (TYLENOL) 500 MG tablet Take 500 mg by mouth every 8 (eight) hours as needed for headache (pain). Reported on 06/07/2015    . amLODipine (NORVASC) 5 MG tablet Take 1 tablet (5 mg total) by mouth daily. 90 tablet 1  .  dabigatran (PRADAXA) 150 MG CAPS capsule TAKE ONE CAPSULE BY MOUTH EVERY 12 HOURS 60 capsule 11  . diazepam (VALIUM) 5 MG tablet Take 1 tablet (5 mg total) by mouth every 12 (twelve) hours as needed for anxiety. 30 tablet 1  . indapamide (LOZOL) 2.5 MG tablet Take 1 tablet (2.5 mg total) by mouth daily. 90 tablet 3  . latanoprost (XALATAN) 0.005 % ophthalmic solution Place 1 drop into both eyes at bedtime.    . Oxycodone HCl 10 MG TABS One 4 times a day prn pain 120 tablet 0  . pantoprazole (PROTONIX) 40 MG tablet TAKE 1 TABLET (40 MG TOTAL) BY MOUTH DAILY. 90 tablet 5  . pravastatin (PRAVACHOL) 80 MG tablet Take 1 tablet (80 mg total) by mouth daily. 90 tablet 2  . ramipril (ALTACE) 10 MG capsule Take 1 capsule (10 mg total) by mouth 2  (two) times daily. 180 capsule 1  . metoprolol tartrate (LOPRESSOR) 25 MG tablet Take 1 tablet as needed for onset of atrial fibrillation (Patient not taking: Reported on 07/15/2016) 6 tablet 1   No current facility-administered medications for this encounter.     Allergies  Allergen Reactions  . Lipitor [Atorvastatin]     Severe joint pain  . Sectral [Acebutolol Hcl]     Side effects  . Sulfonamide Derivatives Other (See Comments)    Runs blood pressure up  . Zocor [Simvastatin]     Joint pain    Social History   Social History  . Marital status: Married    Spouse name: [name]  . Number of children: [name]  . Years of education: [name]   Occupational History  . retired    Social History Main Topics  . Smoking status: Never Smoker  . Smokeless tobacco: Never Used  . Alcohol use Yes     Comment: wine nightly  . Drug use: No  . Sexual activity: Yes   Other Topics Concern  . Not on file   Social History Narrative  . No narrative on file    Family History  Problem Relation Age of Onset  . Hypertension    . Heart disease    . Hypertension Mother   . Transient ischemic attack Mother   . Heart disease Father   . Diabetes Brother   . Healthy Daughter   . Colon cancer Sister     ROS- All systems are reviewed and negative except as per the HPI above  Physical Exam: Vitals:   07/15/16 1320  BP: 132/70  Pulse: 63  Weight: 191 lb 9.6 oz (86.9 kg)  Height: 5\' 6"  (1.676 m)    GEN- The patient is well appearing, alert and oriented x 3 today.   Head- normocephalic, atraumatic Eyes-  Sclera clear, conjunctiva pink Ears- hearing intact Oropharynx- clear Neck- supple, no JVP Lymph- no cervical lymphadenopathy Lungs- Clear to ausculation bilaterally, normal work of breathing Heart- Regular rate and rhythm, no murmurs, rubs or gallops, PMI not laterally displaced GI- soft, NT, ND, + BS Extremities- no clubbing, cyanosis, or edema MS- no significant deformity or  atrophy Skin- no rash or lesion Psych- euthymic mood, full affect Neuro- strength and sensation are intact  EKG- NSR with normal EKG, v rate 63 bpm, pr int 182  bpm, qrs int 88 ms, qtc 427  ms. Epic records reviewed  Assessment and Plan: 1. Afib  No specific instances of prolonged afib reported, a few momentary flutters Has metoprolol tartrate 25 mg as needed for elevated heart  rate Continue pradaxa 150 mg bid, creatinine recently checked and stable   F/u in one year, sooner if needed  Butch Penny C. Tawona Filsinger, [facility] [address] [name], [place] [address] [phone]

## 2016-07-15 NOTE — Telephone Encounter (Signed)
Referring MD/PCP: scott luking   Procedure: tcs  Reason/Indication:  Hx polyps  Has patient had this procedure before?  Yes, 2012  If so, when, by whom and where?    Is there a family history of colon cancer?  no  Who?  What age when diagnosed?    Is patient diabetic?   no      Does patient have prosthetic heart valve or mechanical valve?  no  Do you have a pacemaker?  no  Has patient ever had endocarditis? no  Has patient had joint replacement within last 12 months?  no  Does patient tend to be constipated or take laxatives? Don't have to but he does  Does patient have a history of alcohol/drug use?  no  Is patient on Coumadin, Plavix and/or Aspirin? tes  Medications: see epic  Allergies: nkda  Medication Adjustment per Dr Laural Golden: pradaxa 2 days   Procedure date & time: 08/13/16 at 1200

## 2016-07-20 ENCOUNTER — Other Ambulatory Visit (HOSPITAL_COMMUNITY): Payer: Self-pay | Admitting: Nurse Practitioner

## 2016-08-11 DIAGNOSIS — H401121 Primary open-angle glaucoma, left eye, mild stage: Secondary | ICD-10-CM | POA: Diagnosis not present

## 2016-08-11 DIAGNOSIS — H401112 Primary open-angle glaucoma, right eye, moderate stage: Secondary | ICD-10-CM | POA: Diagnosis not present

## 2016-08-13 ENCOUNTER — Encounter (HOSPITAL_COMMUNITY)
Admission: RE | Disposition: A | Discharge status: Home / Self Care | Payer: Self-pay | Source: Ambulatory Visit | Attending: Internal Medicine

## 2016-08-13 ENCOUNTER — Ambulatory Visit (HOSPITAL_COMMUNITY)
Admission: RE | Admit: 2016-08-13 | Discharge: 2016-08-13 | Disposition: A | Discharge status: Home / Self Care | Payer: PPO | Source: Ambulatory Visit | Attending: Internal Medicine | Admitting: Internal Medicine

## 2016-08-13 ENCOUNTER — Encounter (HOSPITAL_COMMUNITY): Payer: Self-pay | Admitting: *Deleted

## 2016-08-13 DIAGNOSIS — Z860100 Personal history of colon polyps, unspecified: Secondary | ICD-10-CM | POA: Insufficient documentation

## 2016-08-13 DIAGNOSIS — Z09 Encounter for follow-up examination after completed treatment for conditions other than malignant neoplasm: Secondary | ICD-10-CM | POA: Diagnosis not present

## 2016-08-13 DIAGNOSIS — Z8249 Family history of ischemic heart disease and other diseases of the circulatory system: Secondary | ICD-10-CM | POA: Insufficient documentation

## 2016-08-13 DIAGNOSIS — G8929 Other chronic pain: Secondary | ICD-10-CM | POA: Insufficient documentation

## 2016-08-13 DIAGNOSIS — D123 Benign neoplasm of transverse colon: Secondary | ICD-10-CM | POA: Insufficient documentation

## 2016-08-13 DIAGNOSIS — I480 Paroxysmal atrial fibrillation: Secondary | ICD-10-CM | POA: Insufficient documentation

## 2016-08-13 DIAGNOSIS — K5730 Diverticulosis of large intestine without perforation or abscess without bleeding: Secondary | ICD-10-CM | POA: Insufficient documentation

## 2016-08-13 DIAGNOSIS — Z85828 Personal history of other malignant neoplasm of skin: Secondary | ICD-10-CM | POA: Diagnosis not present

## 2016-08-13 DIAGNOSIS — Z9842 Cataract extraction status, left eye: Secondary | ICD-10-CM | POA: Insufficient documentation

## 2016-08-13 DIAGNOSIS — K648 Other hemorrhoids: Secondary | ICD-10-CM | POA: Insufficient documentation

## 2016-08-13 DIAGNOSIS — Z8614 Personal history of Methicillin resistant Staphylococcus aureus infection: Secondary | ICD-10-CM | POA: Diagnosis not present

## 2016-08-13 DIAGNOSIS — Z86718 Personal history of other venous thrombosis and embolism: Secondary | ICD-10-CM | POA: Insufficient documentation

## 2016-08-13 DIAGNOSIS — I10 Essential (primary) hypertension: Secondary | ICD-10-CM | POA: Insufficient documentation

## 2016-08-13 DIAGNOSIS — M549 Dorsalgia, unspecified: Secondary | ICD-10-CM | POA: Insufficient documentation

## 2016-08-13 DIAGNOSIS — Z79899 Other long term (current) drug therapy: Secondary | ICD-10-CM | POA: Diagnosis not present

## 2016-08-13 DIAGNOSIS — Z882 Allergy status to sulfonamides status: Secondary | ICD-10-CM | POA: Diagnosis not present

## 2016-08-13 DIAGNOSIS — Z1211 Encounter for screening for malignant neoplasm of colon: Secondary | ICD-10-CM | POA: Insufficient documentation

## 2016-08-13 DIAGNOSIS — H409 Unspecified glaucoma: Secondary | ICD-10-CM | POA: Diagnosis not present

## 2016-08-13 DIAGNOSIS — K219 Gastro-esophageal reflux disease without esophagitis: Secondary | ICD-10-CM | POA: Insufficient documentation

## 2016-08-13 DIAGNOSIS — M1990 Unspecified osteoarthritis, unspecified site: Secondary | ICD-10-CM | POA: Diagnosis not present

## 2016-08-13 DIAGNOSIS — E785 Hyperlipidemia, unspecified: Secondary | ICD-10-CM | POA: Diagnosis not present

## 2016-08-13 DIAGNOSIS — Z7901 Long term (current) use of anticoagulants: Secondary | ICD-10-CM | POA: Insufficient documentation

## 2016-08-13 HISTORY — PX: POLYPECTOMY: SHX5525

## 2016-08-13 HISTORY — PX: COLONOSCOPY: SHX5424

## 2016-08-13 SURGERY — COLONOSCOPY
Anesthesia: Moderate Sedation

## 2016-08-13 MED ORDER — MEPERIDINE HCL 50 MG/ML IJ SOLN
INTRAMUSCULAR | Status: DC | PRN
  Administered 2016-08-13 (×2): 25 mg via INTRAVENOUS

## 2016-08-13 MED ORDER — SODIUM CHLORIDE 0.9 % IV SOLN
INTRAVENOUS | Status: DC
  Administered 2016-08-13: 12:00:00 via INTRAVENOUS

## 2016-08-13 MED ORDER — MEPERIDINE HCL 50 MG/ML IJ SOLN
INTRAMUSCULAR | Status: AC
  Filled 2016-08-13: qty 1

## 2016-08-13 MED ORDER — MIDAZOLAM HCL 5 MG/5ML IJ SOLN
INTRAMUSCULAR | Status: AC
  Filled 2016-08-13: qty 10

## 2016-08-13 MED ORDER — STERILE WATER FOR IRRIGATION IR SOLN
Status: DC | PRN
  Administered 2016-08-13: 13:00:00

## 2016-08-13 MED ORDER — MIDAZOLAM HCL 5 MG/5ML IJ SOLN
INTRAMUSCULAR | Status: DC | PRN
  Administered 2016-08-13: 2 mg via INTRAVENOUS
  Administered 2016-08-13: 1 mg via INTRAVENOUS
  Administered 2016-08-13: 2 mg via INTRAVENOUS
  Administered 2016-08-13 (×2): 1 mg via INTRAVENOUS

## 2016-08-13 NOTE — Discharge Instructions (Addendum)
High-Fiber Diet Fiber, also called dietary fiber, is a type of carbohydrate found in fruits, vegetables, whole grains, and beans. A high-fiber diet can have many health benefits. Your health care provider may recommend a high-fiber diet to help:  Prevent constipation. Fiber can make your bowel movements more regular.  Lower your cholesterol.  Relieve hemorrhoids, uncomplicated diverticulosis, or irritable bowel syndrome.  Prevent overeating as part of a weight-loss plan.  Prevent heart disease, type 2 diabetes, and certain cancers. What is my plan? The recommended daily intake of fiber includes:  38 grams for men under age 6.  64 grams for men over age 17.  69 grams for women under age 52.  31 grams for women over age 10. You can get the recommended daily intake of dietary fiber by eating a variety of fruits, vegetables, grains, and beans. Your health care provider may also recommend a fiber supplement if it is not possible to get enough fiber through your diet. What do I need to know about a high-fiber diet?  Fiber supplements have not been widely studied for their effectiveness, so it is better to get fiber through food sources.  Always check the fiber content on thenutrition facts label of any prepackaged food. Look for foods that contain at least 5 grams of fiber per serving.  Ask your dietitian if you have questions about specific foods that are related to your condition, especially if those foods are not listed in the following section.  Increase your daily fiber consumption gradually. Increasing your intake of dietary fiber too quickly may cause bloating, cramping, or gas.  Drink plenty of water. Water helps you to digest fiber. What foods can I eat? Grains  Whole-grain breads. Multigrain cereal. Oats and oatmeal. Brown rice. Barley. Bulgur wheat. [place]. Bran muffins. Popcorn. Rye wafer crackers. Vegetables  Sweet potatoes. Spinach. Kale. Artichokes. Cabbage. Broccoli.  Green peas. Carrots. Squash. Fruits  Berries. Pears. Apples. Oranges. Avocados. Prunes and raisins. Dried figs. Meats and Other Protein Sources  [place], kidney, pinto, and soy beans. Split peas. Lentils. Nuts and seeds. Dairy  Fiber-fortified yogurt. Beverages  Fiber-fortified soy milk. Fiber-fortified orange juice. Other  Fiber bars. The items listed above may not be a complete list of recommended foods or beverages. Contact your dietitian for more options.  What foods are not recommended? Grains  White bread. Pasta made with refined flour. White rice. Vegetables  Fried potatoes. Canned vegetables. Well-cooked vegetables. Fruits  Fruit juice. Cooked, strained fruit. Meats and Other Protein Sources  Fatty cuts of meat. Fried Sales executive or fried fish. Dairy  Milk. Yogurt. Cream cheese. Sour cream. Beverages  Soft drinks. Other  Cakes and pastries. Butter and oils. The items listed above may not be a complete list of foods and beverages to avoid. Contact your dietitian for more information.  What are some tips for including high-fiber foods in my diet?  Eat a wide variety of high-fiber foods.  Make sure that half of all grains consumed each day are whole grains.  Replace breads and cereals made from refined flour or white flour with whole-grain breads and cereals.  Replace white rice with brown rice, bulgur wheat, or millet.  Start the day with a breakfast that is high in fiber, such as a cereal that contains at least 5 grams of fiber per serving.  Use beans in place of meat in soups, salads, or pasta.  Eat high-fiber snacks, such as berries, raw vegetables, nuts, or popcorn. This information is not intended to replace  advice given to you by your health care provider. Make sure you discuss any questions you have with your health care provider. Document Released: 05/04/2005 Document Revised: 10/10/2015 Document Reviewed: 10/17/2013 Elsevier Interactive Patient Education  2017  [place].   Colonoscopy, Adult, Care After This sheet gives you information about how to care for yourself after your procedure. Your health care provider may also give you more specific instructions. If you have problems or questions, contact your health care provider. What can I expect after the procedure? After the procedure, it is common to have:  A small amount of blood in your stool for 24 hours after the procedure.  Some gas.  Mild abdominal cramping or bloating. Follow these instructions at home: General instructions    For the first 24 hours after the procedure:  Do not drive or use machinery.  Do not sign important documents.  Do not drink alcohol.  Do your regular daily activities at a slower pace than normal.  Eat soft, easy-to-digest foods.  Rest often.  Take over-the-counter or prescription medicines only as told by your health care provider.  It is up to you to get the results of your procedure. Ask your health care provider, or the department performing the procedure, when your results will be ready. Relieving cramping and bloating   Try walking around when you have cramps or feel bloated.  Apply heat to your abdomen as told by your health care provider. Use a heat source that your health care provider recommends, such as a moist heat pack or a heating pad.  Place a towel between your skin and the heat source.  Leave the heat on for 20-30 minutes.  Remove the heat if your skin turns bright red. This is especially important if you are unable to feel pain, heat, or cold. You may have a greater risk of getting burned. Eating and drinking   Drink enough fluid to keep your urine clear or pale yellow.  Resume your normal diet as instructed by your health care provider. Avoid heavy or fried foods that are hard to digest.  Avoid drinking alcohol for as long as instructed by your health care provider. Contact a health care provider if:  You have blood  in your stool 2-3 days after the procedure. Get help right away if:  You have more than a small spotting of blood in your stool.  You pass large blood clots in your stool.  Your abdomen is swollen.  You have nausea or vomiting.  You have a fever.  You have increasing abdominal pain that is not relieved with medicine. This information is not intended to replace advice given to you by your health care provider. Make sure you discuss any questions you have with your health care provider. Document Released: 12/17/2003 Document Revised: 01/27/2016 Document Reviewed: 07/16/2015 Elsevier Interactive Patient Education  2017 Elsevier Inc. Resume Pradaxa on 08/14/2016.     Resume other medications as before. High fiber diet. No driving for 24 hours. Physician will call with biopsy results.

## 2016-08-13 NOTE — H&P (Signed)
[name] is an 76 y.o. male.   Chief Complaint: Patient is here for colonoscopy. HPI: Patient is 76 year old Caucasian male who was history of colonic polyps and is here for surveillance colonoscopy. Last exam was over 5 years ago. He denies abdominal pain change in bowel habits or rectal bleeding. Family history significant for CRC in a sister at late onset. She was in her 27s at the time of diagnosis. Patient has been off Pradaxa for 2 days.  Past Medical History:  Diagnosis Date  . Arthritis   . Skin cancer.    . Carpal tunnel syndrome   . Cataract    left eye  . Chronic back pain    multiple back surgeries  .       Marland Kitchen Constipation    Metamucil every mornind  . DVT (deep venous thrombosis) ([place]) 03/2007   left leg   . GERD (gastroesophageal reflux disease)    takes Protonix daily  . Glaucoma   . History of colon polyps       . History of MRSA infection 1992  . HTN (hypertension)    takes  Ramipril daily  . Hyperlipidemia    takes Pravastatin 3 times a week  . Joint pain   . Joint swelling   . Nocturia   . Paroxysmal atrial fibrillation ([facility])    takes Pradaxa daily, Chads2vasc score 2(07/11/14)  . Pneumonia    many yrs ago  .  2008  .    Marland Kitchen      Past Surgical History:  Procedure Laterality Date  . CARDIAC CATHETERIZATION  2008  . CARPAL TUNNEL RELEASE Right   . cataract surgery Right   . cervical and lumbar surgery    . COLONOSCOPY  04/24/2011   Procedure: COLONOSCOPY;  Surgeon: Rogene Houston, MD;  [facility];  Service: Endoscopy;  Laterality: N/A;  1200  . ESOPHAGOGASTRODUODENOSCOPY  03/10/2012   Procedure: ESOPHAGOGASTRODUODENOSCOPY (EGD);  Surgeon: Rogene Houston, MD;  [facility];  Service: Endoscopy;  Laterality: N/A;  325  . HARDWARE REMOVAL N/A 04/24/2014   Procedure: REMOVAL OF SACRAL INSTRUMENTATION WITH [place];  Surgeon: Kristeen Miss, MD;  Location: [facility];  Service: Neurosurgery;  Laterality: N/A;  .  HEMORRHOIDECTOMY WITH HEMORRHOID BANDING    . KNEE ARTHROSCOPY Right   . KNEE SURGERY     left  . LASIK    . PVI  01/24/10   afib ablation    Family History  Problem Relation Age of Onset  . Hypertension Mother   . Transient ischemic attack Mother   . Heart disease Father   . Diabetes Brother   . Healthy Daughter   . Colon cancer Sister   . Hypertension    . Heart disease     Social History:  reports that he has never smoked. He has never used smokeless tobacco. He reports that he drinks alcohol. He reports that he does not use drugs.  Allergies:  Allergies  Allergen Reactions  . Sectral [Acebutolol Hcl]     Side effects  . Sulfonamide Derivatives Other (See Comments)    Runs blood pressure up  . Zocor [Simvastatin]     Joint pain    Medications Prior to Admission  Medication Sig Dispense Refill  . acetaminophen (TYLENOL) 500 MG tablet Take 1,000 mg by mouth every 8 (eight) hours as needed for headache (pain). Reported on 06/07/2015    . amLODipine (NORVASC) 5 MG tablet Take 1 tablet (  5 mg total) by mouth daily. 90 tablet 1  . dabigatran (PRADAXA) 150 MG CAPS capsule TAKE ONE CAPSULE BY MOUTH EVERY 12 HOURS 60 capsule 11  . indapamide (LOZOL) 2.5 MG tablet Take 1 tablet (2.5 mg total) by mouth daily. 90 tablet 3  . latanoprost (XALATAN) 0.005 % ophthalmic solution Place 1 drop into both eyes at bedtime.    . metoprolol tartrate (LOPRESSOR) 25 MG tablet Take 1 tablet as needed for onset of atrial fibrillation 6 tablet 1  . Oxycodone HCl 10 MG TABS One 4 times a day prn pain (Patient taking differently: Take 10 mg by mouth 4 (four) times daily as needed (pain). ) 120 tablet 0  . pantoprazole (PROTONIX) 40 MG tablet TAKE 1 TABLET (40 MG TOTAL) BY MOUTH DAILY. (Patient taking differently: Take 40 mg by mouth daily as needed (indigestion). ) 90 tablet 5  . pravastatin (PRAVACHOL) 80 MG tablet Take 1 tablet (80 mg total) by mouth daily. (Patient taking differently: Take 80 mg by  mouth every Monday, Wednesday, and Friday. ) 90 tablet 2  . ramipril (ALTACE) 10 MG capsule Take 1 capsule (10 mg total) by mouth 2 (two) times daily. 180 capsule 1  . diazepam (VALIUM) 5 MG tablet Take 1 tablet (5 mg total) by mouth every 12 (twelve) hours as needed for anxiety. (Patient not taking: Reported on 08/10/2016) 30 tablet 1    No results found for this or any previous visit (from the past 48 hour(s)). No results found.  ROS  Blood pressure (!) 163/83, pulse 68, temperature 98.1 F (36.7 C), temperature source Oral, resp. rate 18, height 5\' 6"  (1.676 m), weight 190 lb (86.2 kg), SpO2 95 %. Physical Exam  Constitutional: He appears well-developed and well-nourished.  HENT:  Mouth/Throat: Oropharynx is clear and moist.  Eyes: Conjunctivae are normal.  Neck: No thyromegaly present.  Cardiovascular: Normal rate, regular rhythm and normal heart sounds.   No murmur heard. Respiratory: Effort normal and breath sounds normal.  GI: Soft. He exhibits no distension and no mass. There is no tenderness.  Musculoskeletal: He exhibits no edema.  Lymphadenopathy:    He has no cervical adenopathy.  Neurological: He is alert.  Skin: Skin is warm and dry.     Assessment/Plan History of colonic adenomas. Family history of CRC in sister at late onset. Surveillance colonoscopy.  Hildred Laser, MD 08/13/2016, 1:07 PM

## 2016-08-13 NOTE — Op Note (Signed)
[facility] Patient Name: [name] Procedure Date: 08/13/2016 12:48 PM MRN: [id number] Date of Birth: 1940-07-31 Attending MD: Hildred Laser , MD CSN: [id number] Age: 76 Admit Type: Outpatient Procedure:                Colonoscopy Indications:              High risk colon cancer surveillance: Personal                            history of colonic polyps Providers:                Hildred Laser, MD, Otis Peak B. Sharon Seller, RN, Aram Candela Referring MD:             Elayne Snare. Wolfgang Phoenix, MD Medicines:                Meperidine 50 mg IV, Midazolam 7 mg IV Complications:            No immediate complications. Estimated Blood Loss:     Estimated blood loss was minimal. Procedure:                Pre-Anesthesia Assessment:                           - Prior to the procedure, a History and Physical                            was performed, and patient medications and                            allergies were reviewed. The patient's tolerance of                            previous anesthesia was also reviewed. The risks                            and benefits of the procedure and the sedation                            options and risks were discussed with the patient.                            All questions were answered, and informed consent                            was obtained. Prior Anticoagulants: The patient                            last took Pradaxa (dabigatran) 2 days prior to the                            procedure. ASA Grade Assessment: III - A patient  with severe systemic disease. After reviewing the                            risks and benefits, the patient was deemed in                            satisfactory condition to undergo the procedure.                           After obtaining informed consent, the colonoscope                            was passed under direct vision. Throughout the   procedure, the patient's blood pressure, pulse, and                            oxygen saturations were monitored continuously. The                            EC-[id number] ([id number]) scope was introduced through                            the anus and advanced to the the cecum, identified                            by appendiceal orifice and ileocecal valve. The                            colonoscopy was performed without difficulty. The                            patient tolerated the procedure well. The quality                            of the bowel preparation was good. The ileocecal                            valve, appendiceal orifice, and rectum were                            photographed. Scope In: 1:22:26 PM Scope Out: 1:53:25 PM Scope Withdrawal Time: 0 hours 23 minutes 24 seconds  Total Procedure Duration: 0 hours 30 minutes 59 seconds  Findings:      The perianal and digital rectal examinations were normal.      A 5 mm polyp was found in the hepatic flexure. The polyp was sessile.       The polyp was removed with a cold snare. Resection and retrieval were       complete. The pathology specimen was placed into Bottle Number 1.      Two sessile polyps were found in the transverse colon. The polyps were       small in size. These polyps were removed with a cold snare. Resection       and retrieval were complete. The pathology specimen was placed into  Bottle Number 1.      Two sessile polyps were found in the transverse colon. The polyps were       small in size. These were biopsied with a cold forceps for histology.       The pathology specimen was placed into Bottle Number 1.      Scattered medium-mouthed diverticula were found in the sigmoid colon.      Internal hemorrhoids were found during retroflexion. The hemorrhoids       were medium-sized. Impression:               - One 5 mm polyp at the hepatic flexure, removed                            with a cold snare. Resected  and retrieved.                           - Two small polyps in the transverse colon, removed                            with a cold snare. Resected and retrieved.                           - Two small polyps in the transverse colon.                            Biopsied.                           - Diverticulosis in the sigmoid colon.                           - Internal hemorrhoids. Moderate Sedation:      Moderate (conscious) sedation was administered by the endoscopy nurse       and supervised by the endoscopist. The following parameters were       monitored: oxygen saturation, heart rate, blood pressure, CO2       capnography and response to care. Total physician intraservice time was       39 minutes. Recommendation:           - Patient has a contact number available for                            emergencies. The signs and symptoms of potential                            delayed complications were discussed with the                            patient. Return to normal activities tomorrow.                            Written discharge instructions were provided to the                            patient.                           -  High fiber diet today.                           - Continue present medications.                           - Resume Pradaxa (dabigatran) at prior dose                            tomorrow.                           - Await pathology results.                           - Repeat colonoscopy in 5 years for surveillance. Procedure Code(s):        --- Professional ---                           [phone], Colonoscopy, flexible; with removal of                            tumor(s), polyp(s), or other lesion(s) by snare                            technique                           45380, 59, Colonoscopy, flexible; with biopsy,                            single or multiple                           99152, Moderate sedation services provided by the                             same physician or other qualified health care                            professional performing the diagnostic or                            therapeutic service that the sedation supports,                            requiring the presence of an independent trained                            observer to assist in the monitoring of the                            patient's level of consciousness and physiological                            status; initial 15 minutes of intraservice time,  patient age 86 years or older                           [phone], Moderate sedation services; each additional                            15 minutes intraservice time                           [phone], Moderate sedation services; each additional                            15 minutes intraservice time Diagnosis Code(s):        --- Professional ---                           D12.3, Benign neoplasm of transverse colon (hepatic                            flexure or splenic flexure)                           Z86.010, Personal history of colonic polyps                           K64.8, Other hemorrhoids                           K57.30, Diverticulosis of large intestine without                            perforation or abscess without bleeding CPT copyright 2016 [employer]. All rights reserved. The codes documented in this report are preliminary and upon coder review may  be revised to meet current compliance requirements. Hildred Laser, MD Hildred Laser, MD 08/13/2016 2:01:35 PM This report has been signed electronically. Number of Addenda: 0

## 2016-08-20 ENCOUNTER — Encounter (HOSPITAL_COMMUNITY): Payer: Self-pay | Admitting: Internal Medicine

## 2016-09-10 DIAGNOSIS — X32XXXD Exposure to sunlight, subsequent encounter: Secondary | ICD-10-CM | POA: Diagnosis not present

## 2016-09-10 DIAGNOSIS — L905 Scar conditions and fibrosis of skin: Secondary | ICD-10-CM | POA: Diagnosis not present

## 2016-09-10 DIAGNOSIS — L570 Actinic keratosis: Secondary | ICD-10-CM | POA: Diagnosis not present

## 2016-10-08 DIAGNOSIS — Z961 Presence of intraocular lens: Secondary | ICD-10-CM | POA: Diagnosis not present

## 2016-10-08 DIAGNOSIS — H401131 Primary open-angle glaucoma, bilateral, mild stage: Secondary | ICD-10-CM | POA: Diagnosis not present

## 2016-10-08 DIAGNOSIS — H02839 Dermatochalasis of unspecified eye, unspecified eyelid: Secondary | ICD-10-CM | POA: Diagnosis not present

## 2016-10-08 DIAGNOSIS — H2512 Age-related nuclear cataract, left eye: Secondary | ICD-10-CM | POA: Diagnosis not present

## 2016-10-14 DIAGNOSIS — S46211D Strain of muscle, fascia and tendon of other parts of biceps, right arm, subsequent encounter: Secondary | ICD-10-CM | POA: Diagnosis not present

## 2016-10-27 ENCOUNTER — Ambulatory Visit (INDEPENDENT_AMBULATORY_CARE_PROVIDER_SITE_OTHER): Payer: PPO | Admitting: Family Medicine

## 2016-10-27 ENCOUNTER — Encounter: Payer: Self-pay | Admitting: Family Medicine

## 2016-10-27 VITALS — BP 132/68 | Temp 98.1°F | Ht 66.0 in | Wt 188.1 lb

## 2016-10-27 DIAGNOSIS — E784 Other hyperlipidemia: Secondary | ICD-10-CM

## 2016-10-27 DIAGNOSIS — I10 Essential (primary) hypertension: Secondary | ICD-10-CM | POA: Diagnosis not present

## 2016-10-27 DIAGNOSIS — E7849 Other hyperlipidemia: Secondary | ICD-10-CM

## 2016-10-27 DIAGNOSIS — R319 Hematuria, unspecified: Secondary | ICD-10-CM

## 2016-10-27 LAB — POCT URINALYSIS DIPSTICK
Blood, UA: NEGATIVE
Leukocytes, UA: NEGATIVE
Spec Grav, UA: 1.025 (ref 1.010–1.025)
pH, UA: 5 (ref 5.0–8.0)

## 2016-10-27 MED ORDER — OXYCODONE HCL 10 MG PO TABS: ORAL_TABLET | ORAL | 0 refills | Status: DC

## 2016-10-27 NOTE — Progress Notes (Signed)
   Subjective:    Patient ID: [name], male    DOB: 08/14/1940, 76 y.o.   MRN: [id number]  Flank Pain  This is a new problem. The current episode started in the past 7 days. The quality of the pain is described as burning and aching. (Blood in urine)   Patient states that he has had blood in urine a few weeks ago. Blood has resolved.  Patient relates he was urinating blood intermittently a couple weeks ago he is also noticed left flank pain over the past few weeks does not radiate into the abdomen denies vomiting diarrhea denies fever chills sweats denies dysuria  This patient was seen today for chronic pain  The medication list was reviewed and updated.   -Compliance with medication: Patient relates he takes anywhere between 2 and 4 tablets a day  - Number patient states they take daily: 2-4 per day  -when was the last dose patient took? Earlier today, denies any side effects  The patient was advised the importance of maintaining medication and not using illegal substances with these.  Refills needed: Needs refills on medicine  The patient was educated that we can provide 3 monthly scripts for their medication, it is their responsibility to follow the instructions.  Side effects or complications from medications: Denies any side effects denies drowsiness  Patient is aware that pain medications are meant to minimize the severity of the pain to allow their pain levels to improve to allow for better function. They are aware of that pain medications cannot totally remove their pain.  Due for UDT ( at least once per year) : On next visit       Results for orders placed or performed in visit on 10/27/16  POCT urinalysis dipstick  Result Value Ref Range   Color, UA Yellow    Clarity, UA Clear    Glucose, UA     Bilirubin, UA     Ketones, UA     Spec Grav, UA 1.025 1.010 - 1.025   Blood, UA Negative    pH, UA 5.0 5.0 - 8.0   Protein, UA     Urobilinogen, UA  0.2 or 1.0  E.U./dL   Nitrite, UA     Leukocytes, UA Negative Negative    Review of Systems  Genitourinary: Positive for flank pain.  Positive for hematuria negative for abdominal pain negative for nausea vomiting diarrhea fever chills sweats negative for chest pain     Objective:   Physical Exam  Lungs clear heart rate is controlled has subjective discomfort left flank nontender noticed percussion abdomen soft extremities no edema      Assessment & Plan:  Flank pain with hematuria and possibly kidney stone cannot rule out the possibility of something more serious will go ahead with CT scan and also lab work.  The patient was seen today as part of a comprehensive visit regarding pain control. Patient's compliance with the medication as well as discussion regarding effectiveness was completed. Prescriptions were written. Patient was advised to follow-up in 3 months. The patient was assessed for any signs of severe side effects. The patient was advised to take the medicine as directed and to report to [place] if any side effect issues. Patient was also seen for pain medication 3 prescriptions are given drug registry was checked pain medicine helps him function better  -Blood pressure good control continue current measures  Hyperlipidemia watch diet previous labs reviewed

## 2016-10-28 ENCOUNTER — Telehealth: Payer: Self-pay | Admitting: Family Medicine

## 2016-10-28 LAB — CBC WITH DIFFERENTIAL/PLATELET
Basophils Absolute: 0.1 10*3/uL (ref 0.0–0.2)
Basos: 1 %
EOS (ABSOLUTE): 0.2 10*3/uL (ref 0.0–0.4)
Eos: 4 %
Hematocrit: 45.2 % (ref 37.5–51.0)
Hemoglobin: 15.1 g/dL (ref 13.0–17.7)
Immature Grans (Abs): 0 10*3/uL (ref 0.0–0.1)
Immature Granulocytes: 0 %
Lymphocytes Absolute: 1.7 10*3/uL (ref 0.7–3.1)
Lymphs: 31 %
MCH: 31 pg (ref 26.6–33.0)
MCHC: 33.4 g/dL (ref 31.5–35.7)
MCV: 93 fL (ref 79–97)
Monocytes Absolute: 0.8 10*3/uL (ref 0.1–0.9)
Monocytes: 15 %
Neutrophils Absolute: 2.7 10*3/uL (ref 1.4–7.0)
Neutrophils: 49 %
Platelets: 256 10*3/uL (ref 150–379)
RBC: 4.87 x10E6/uL (ref 4.14–5.80)
RDW: 13.6 % (ref 12.3–15.4)
WBC: 5.5 10*3/uL (ref 3.4–10.8)

## 2016-10-28 LAB — BASIC METABOLIC PANEL
BUN/Creatinine Ratio: 23 (ref 10–24)
BUN: 22 mg/dL (ref 8–27)
CO2: 26 mmol/L (ref 20–29)
Calcium: 9.5 mg/dL (ref 8.6–10.2)
Chloride: 98 mmol/L (ref 96–106)
Creatinine, Ser: 0.97 mg/dL (ref 0.76–1.27)
GFR calc Af Amer: 88 mL/min/{1.73_m2} (ref >59)
GFR calc non Af Amer: 76 mL/min/{1.73_m2} (ref >59)
Glucose: 74 mg/dL (ref 65–99)
Potassium: 4.4 mmol/L (ref 3.5–5.2)
Sodium: 139 mmol/L (ref 134–144)

## 2016-10-28 LAB — HEPATIC FUNCTION PANEL
ALT: 20 IU/L (ref 0–44)
AST: 29 IU/L (ref 0–40)
Albumin: 4.8 g/dL (ref 3.5–4.8)
Alkaline Phosphatase: 73 IU/L (ref 39–117)
Bilirubin Total: 0.5 mg/dL (ref 0.0–1.2)
Bilirubin, Direct: 0.15 mg/dL (ref 0.00–0.40)
Total Protein: 7.2 g/dL (ref 6.0–8.5)

## 2016-10-28 NOTE — Telephone Encounter (Signed)
CT prior auth submitted through [employer] due to pt's [employer] coverage  Await decision

## 2016-10-29 DIAGNOSIS — L570 Actinic keratosis: Secondary | ICD-10-CM | POA: Diagnosis not present

## 2016-10-29 DIAGNOSIS — X32XXXD Exposure to sunlight, subsequent encounter: Secondary | ICD-10-CM | POA: Diagnosis not present

## 2016-10-29 NOTE — Telephone Encounter (Signed)
Checked status on [employer] - still suspended, no decision yet

## 2016-11-02 NOTE — Telephone Encounter (Signed)
Called [facility] to check status, was told it would be looked in to & someone would call me back  Waited a couple hours with no return call, so checked [employer] online to find it was approved  Auth# [id number], valid 11/04/16-02/02/17 for CT abd/pelv at [facility]  Send approval to be scanned in to the chart & noted in referral in Epic

## 2016-11-04 ENCOUNTER — Ambulatory Visit (HOSPITAL_COMMUNITY)
Admission: RE | Admit: 2016-11-04 | Discharge: 2016-11-04 | Disposition: A | Discharge status: Home / Self Care | Payer: PPO | Source: Ambulatory Visit | Attending: Family Medicine | Admitting: Family Medicine

## 2016-11-04 DIAGNOSIS — R319 Hematuria, unspecified: Secondary | ICD-10-CM | POA: Insufficient documentation

## 2016-11-04 DIAGNOSIS — K5730 Diverticulosis of large intestine without perforation or abscess without bleeding: Secondary | ICD-10-CM | POA: Insufficient documentation

## 2016-11-04 DIAGNOSIS — I700 Atherosclerosis of aorta: Secondary | ICD-10-CM | POA: Insufficient documentation

## 2016-11-05 ENCOUNTER — Encounter: Payer: Self-pay | Admitting: Family Medicine

## 2016-11-05 DIAGNOSIS — I700 Atherosclerosis of aorta: Secondary | ICD-10-CM | POA: Insufficient documentation

## 2016-11-06 NOTE — Addendum Note (Signed)
Addended by: Launa Grill on: 11/06/2016 04:28 PM   Modules accepted: Orders

## 2016-11-10 ENCOUNTER — Encounter (INDEPENDENT_AMBULATORY_CARE_PROVIDER_SITE_OTHER): Payer: Self-pay

## 2016-11-10 ENCOUNTER — Encounter (INDEPENDENT_AMBULATORY_CARE_PROVIDER_SITE_OTHER): Payer: Self-pay | Admitting: Internal Medicine

## 2016-11-10 ENCOUNTER — Encounter: Payer: Self-pay | Admitting: Family Medicine

## 2016-11-30 ENCOUNTER — Ambulatory Visit: Payer: PPO | Admitting: Family Medicine

## 2016-12-04 ENCOUNTER — Ambulatory Visit (INDEPENDENT_AMBULATORY_CARE_PROVIDER_SITE_OTHER): Payer: PPO | Admitting: Internal Medicine

## 2016-12-07 ENCOUNTER — Ambulatory Visit (INDEPENDENT_AMBULATORY_CARE_PROVIDER_SITE_OTHER): Payer: PPO | Admitting: Internal Medicine

## 2016-12-07 ENCOUNTER — Encounter (INDEPENDENT_AMBULATORY_CARE_PROVIDER_SITE_OTHER): Payer: Self-pay | Admitting: Internal Medicine

## 2016-12-07 VITALS — BP 140/66 | HR 64 | Temp 97.8°F | Ht 66.0 in | Wt 185.2 lb

## 2016-12-07 DIAGNOSIS — K219 Gastro-esophageal reflux disease without esophagitis: Secondary | ICD-10-CM | POA: Diagnosis not present

## 2016-12-07 NOTE — Patient Instructions (Signed)
Continue the Protonix. OV in 1 year.  

## 2016-12-07 NOTE — Progress Notes (Signed)
Subjective:    Patient ID: [name], male    DOB: October 16, 1940, 76 y.o.   MRN: [id number]  HPI Here today for f/u. Last seen in July of 2017. Hx of chronic GERD.  States he is doing good. His appetite is good. No weight loss. States his acid reflux is good. Has lost about 8 pounds since his last visit. States weight loss due to the weather. Has a BM day. No melena or BRRB.  Injured his rt upper arm from throwing a rock.  He ruptured a ligament in his upper arm.   Last colonoscopy in March of this year.     08/13/2016 Colonoscopy: person hx of colon polyps.  Impression:               - One 5 mm polyp at the hepatic flexure, removed                            with a cold snare. Resected and retrieved.                           - Two small polyps in the transverse colon, removed                            with a cold snare. Resected and retrieved.                           - Two small polyps in the transverse colon.                            Biopsied.                           - Diverticulosis in the sigmoid colon.                           - Internal hemorrhoids. Biopsy: all polyps are tubular adenoma. Next colonoscopy in 5 yrs.   03/10/2012 EGD: Impression:  Mild changes of reflux esophagitis limited to GE junction which is serrated. Biopsy taken to rule out short segment Barrett's esophagus.  Small sliding hiatal hernia.  Pyloric channel inflammation without stricture or ulcer.  Biopsy:  Notes Recorded by Rogene Houston, MD on 03/21/2012 at 2:04 PM H. Pylori serology is negative. Patient not doing any better with pantoprazole twice a day according to his wife. Will proceed with solid phase GES Report to PCP  04/07/2012: Gastric emptying normal.  Conclusion: No evidence suggesting gastric paresis or gastric outlet obstruction.   Review of Systems Past Medical History:  Diagnosis Date  . Arthritis   . Cancer ([place])   . Carpal tunnel syndrome   . Cataract    left eye  . Chronic back pain    multiple back surgeries  . Complication of anesthesia    pt states woke up during hemorroid surgery  . Constipation    Metamucil every mornind  . DVT (deep venous thrombosis) ([place]) 03/2007   left leg   . GERD (gastroesophageal reflux disease)    takes Protonix daily  . Glaucoma   . History of colon polyps   . History of hyperglycemia   . History of MRSA infection 1992  .  HTN (hypertension)    takes  Ramipril daily  . Hyperlipidemia    takes Pravastatin 3 times a week  . Joint pain   . Joint swelling   . Nocturia   . Paroxysmal atrial fibrillation ([facility])    takes Pradaxa daily, Chads2vasc score 2(07/11/14)  . Pneumonia    many yrs ago  . Pulmonary embolism ([place]) 2008  . Urinary frequency   . Urinary urgency     Past Surgical History:  Procedure Laterality Date  . CARDIAC CATHETERIZATION  2008  . CARPAL TUNNEL RELEASE Right   . cataract surgery Right   . cervical and lumbar surgery    . COLONOSCOPY  04/24/2011   Procedure: COLONOSCOPY;  Surgeon: Rogene Houston, MD;  [facility];  Service: Endoscopy;  Laterality: N/A;  1200  . COLONOSCOPY N/A 08/13/2016   Procedure: COLONOSCOPY;  Surgeon: Rogene Houston, MD;  Location: [facility];  Service: Endoscopy;  Laterality: N/A;  1200  . ESOPHAGOGASTRODUODENOSCOPY  03/10/2012   Procedure: ESOPHAGOGASTRODUODENOSCOPY (EGD);  Surgeon: Rogene Houston, MD;  Location: [facility];  Service: Endoscopy;  Laterality: N/A;  325  . HARDWARE REMOVAL N/A 04/24/2014   Procedure: REMOVAL OF SACRAL INSTRUMENTATION WITH [place];  Surgeon: Kristeen Miss, MD;  Location: [place] [facility];  Service: Neurosurgery;  Laterality: N/A;  . HEMORRHOIDECTOMY WITH HEMORRHOID BANDING    . KNEE ARTHROSCOPY Right   . KNEE SURGERY     left  . LASIK    . POLYPECTOMY  08/13/2016   Procedure: POLYPECTOMY;  Surgeon: Rogene Houston, MD;  Location: [facility];  Service: Endoscopy;;  colon  . PVI  01/24/10   afib ablation     Allergies  Allergen Reactions  . Sectral [Acebutolol Hcl]     Side effects  . Sulfonamide Derivatives Other (See Comments)    Runs blood pressure up  . Zocor [Simvastatin]     Joint pain    Current Outpatient Prescriptions on File Prior to Visit  Medication Sig Dispense Refill  . acetaminophen (TYLENOL) 500 MG tablet Take 1,000 mg by mouth every 8 (eight) hours as needed for headache (pain). Reported on 06/07/2015    . amLODipine (NORVASC) 5 MG tablet Take 1 tablet (5 mg total) by mouth daily. 90 tablet 1  . dabigatran (PRADAXA) 150 MG CAPS capsule TAKE ONE CAPSULE BY MOUTH EVERY 12 HOURS 60 capsule 11  . indapamide (LOZOL) 2.5 MG tablet Take 1 tablet (2.5 mg total) by mouth daily. 90 tablet 3  . latanoprost (XALATAN) 0.005 % ophthalmic solution Place 1 drop into both eyes at bedtime.    . metoprolol tartrate (LOPRESSOR) 25 MG tablet Take 1 tablet as needed for onset of atrial fibrillation (Patient not taking: Reported on 10/27/2016) 6 tablet 1  . Oxycodone HCl 10 MG TABS One 4 times a day prn pain 120 tablet 0  . pantoprazole (PROTONIX) 40 MG tablet TAKE 1 TABLET (40 MG TOTAL) BY MOUTH DAILY. (Patient taking differently: Take 40 mg by mouth daily as needed (indigestion). ) 90 tablet 5  . pravastatin (PRAVACHOL) 80 MG tablet Take 1 tablet (80 mg total) by mouth daily. (Patient taking differently: Take 80 mg by mouth every Monday, Wednesday, and Friday. ) 90 tablet 2  . ramipril (ALTACE) 10 MG capsule Take 1 capsule (10 mg total) by mouth 2 (two) times daily. 180 capsule 1   No current facility-administered medications on file prior to visit.  Objective:   Physical Exam Blood pressure 140/66, pulse 64, temperature 97.8 F (36.6 C), height 5\' 6"  (1.676 m), weight 185 lb 3.2 oz (84 kg). Alert and oriented. Skin warm and dry. Oral mucosa is moist.   . Sclera anicteric, conjunctivae is pink. Thyroid not enlarged. No cervical lymphadenopathy. Lungs clear. Heart regular rate and  rhythm.  Abdomen is soft. Bowel sounds are positive. No hepatomegaly. No abdominal masses felt. No tenderness.  No edema to lower extremities.  Gross deformity to rt upper arm from injury last year.        Assessment & Plan:  Jerrye Bushy. He will continue the Protonix. OV in 1 year.

## 2016-12-21 ENCOUNTER — Emergency Department (HOSPITAL_COMMUNITY)
Admission: EM | Admit: 2016-12-21 | Discharge: 2016-12-21 | Disposition: A | Discharge status: Home / Self Care | Payer: PPO | Attending: Emergency Medicine | Admitting: Emergency Medicine

## 2016-12-21 ENCOUNTER — Encounter (HOSPITAL_COMMUNITY): Payer: Self-pay | Admitting: Emergency Medicine

## 2016-12-21 DIAGNOSIS — Z79899 Other long term (current) drug therapy: Secondary | ICD-10-CM | POA: Insufficient documentation

## 2016-12-21 DIAGNOSIS — I10 Essential (primary) hypertension: Secondary | ICD-10-CM | POA: Diagnosis not present

## 2016-12-21 DIAGNOSIS — M545 Low back pain, unspecified: Secondary | ICD-10-CM

## 2016-12-21 DIAGNOSIS — Z859 Personal history of malignant neoplasm, unspecified: Secondary | ICD-10-CM | POA: Diagnosis not present

## 2016-12-21 LAB — COMPREHENSIVE METABOLIC PANEL
ALT: 22 U/L (ref 17–63)
AST: 27 U/L (ref 15–41)
Albumin: 4.6 g/dL (ref 3.5–5.0)
Alkaline Phosphatase: 67 U/L (ref 38–126)
Anion gap: 9 (ref 5–15)
BUN: 17 mg/dL (ref 6–20)
CO2: 29 mmol/L (ref 22–32)
Calcium: 9.5 mg/dL (ref 8.9–10.3)
Chloride: 99 mmol/L — ABNORMAL LOW (ref 101–111)
Creatinine, Ser: 1.04 mg/dL (ref 0.61–1.24)
GFR calc Af Amer: >60 mL/min (ref >60)
GFR calc non Af Amer: >60 mL/min (ref >60)
Glucose, Bld: 130 mg/dL — ABNORMAL HIGH (ref 65–99)
Potassium: 3.9 mmol/L (ref 3.5–5.1)
Sodium: 137 mmol/L (ref 135–145)
Total Bilirubin: 1 mg/dL (ref 0.3–1.2)
Total Protein: 8 g/dL (ref 6.5–8.1)

## 2016-12-21 LAB — CBC WITH DIFFERENTIAL/PLATELET
Basophils Absolute: 0 10*3/uL (ref 0.0–0.1)
Basophils Relative: 1 %
Eosinophils Absolute: 0.1 10*3/uL (ref 0.0–0.7)
Eosinophils Relative: 2 %
HCT: 46.9 % (ref 39.0–52.0)
Hemoglobin: 15.9 g/dL (ref 13.0–17.0)
Lymphocytes Relative: 22 %
Lymphs Abs: 1.1 10*3/uL (ref 0.7–4.0)
MCH: 31.3 pg (ref 26.0–34.0)
MCHC: 33.9 g/dL (ref 30.0–36.0)
MCV: 92.3 fL (ref 78.0–100.0)
Monocytes Absolute: 0.5 10*3/uL (ref 0.1–1.0)
Monocytes Relative: 9 %
Neutro Abs: 3.2 10*3/uL (ref 1.7–7.7)
Neutrophils Relative %: 66 %
Platelets: 247 10*3/uL (ref 150–400)
RBC: 5.08 MIL/uL (ref 4.22–5.81)
RDW: 13 % (ref 11.5–15.5)
WBC: 4.9 10*3/uL (ref 4.0–10.5)

## 2016-12-21 LAB — URINALYSIS, ROUTINE W REFLEX MICROSCOPIC
Bilirubin Urine: NEGATIVE
Glucose, UA: NEGATIVE mg/dL
Hgb urine dipstick: NEGATIVE
Ketones, ur: NEGATIVE mg/dL
Leukocytes, UA: NEGATIVE
Nitrite: NEGATIVE
Protein, ur: NEGATIVE mg/dL
Specific Gravity, Urine: 1.016 (ref 1.005–1.030)
pH: 7 (ref 5.0–8.0)

## 2016-12-21 MED ORDER — ONDANSETRON HCL 4 MG/2ML IJ SOLN
4.0000 mg | Freq: Once | INTRAMUSCULAR | Status: AC
  Administered 2016-12-21: 4 mg via INTRAVENOUS
  Filled 2016-12-21: qty 2

## 2016-12-21 MED ORDER — LORAZEPAM 2 MG/ML IJ SOLN
2.0000 mg | Freq: Once | INTRAMUSCULAR | Status: AC
  Administered 2016-12-21: 2 mg via INTRAVENOUS
  Filled 2016-12-21: qty 1

## 2016-12-21 MED ORDER — KETOROLAC TROMETHAMINE 30 MG/ML IJ SOLN
30.0000 mg | Freq: Once | INTRAMUSCULAR | Status: AC
  Administered 2016-12-21: 30 mg via INTRAVENOUS
  Filled 2016-12-21: qty 1

## 2016-12-21 MED ORDER — HYDROMORPHONE HCL 1 MG/ML IJ SOLN
1.0000 mg | Freq: Once | INTRAMUSCULAR | Status: AC
  Administered 2016-12-21: 1 mg via INTRAVENOUS
  Filled 2016-12-21: qty 1

## 2016-12-21 MED ORDER — MORPHINE SULFATE (PF) 4 MG/ML IV SOLN
4.0000 mg | Freq: Once | INTRAVENOUS | Status: AC
  Administered 2016-12-21: 4 mg via INTRAVENOUS
  Filled 2016-12-21: qty 1

## 2016-12-21 MED ORDER — DIAZEPAM 5 MG PO TABS: 5.0000 mg | ORAL_TABLET | Freq: Two times a day (BID) | ORAL | 0 refills | Status: DC

## 2016-12-21 MED ORDER — HYDROMORPHONE HCL 2 MG/ML IJ SOLN
2.0000 mg | Freq: Once | INTRAMUSCULAR | Status: AC
  Administered 2016-12-21: 2 mg via INTRAVENOUS
  Filled 2016-12-21: qty 1

## 2016-12-21 MED ORDER — DEXAMETHASONE SODIUM PHOSPHATE 4 MG/ML IJ SOLN
10.0000 mg | Freq: Once | INTRAMUSCULAR | Status: AC
  Administered 2016-12-21: 10 mg via INTRAVENOUS
  Filled 2016-12-21: qty 3

## 2016-12-21 MED ORDER — LORAZEPAM 2 MG/ML IJ SOLN
1.0000 mg | Freq: Once | INTRAMUSCULAR | Status: AC
  Administered 2016-12-21: 1 mg via INTRAVENOUS
  Filled 2016-12-21: qty 1

## 2016-12-21 MED ORDER — PREDNISONE 10 MG (21) PO TBPK: ORAL_TABLET | ORAL | 10 refills | Status: DC

## 2016-12-21 NOTE — ED Provider Notes (Signed)
[facility] DEPT Provider Note   CSN: [id number] Arrival date & time: 12/21/16  1034     History   Chief Complaint Chief Complaint  Patient presents with  . Back Pain    HPI [name] is a 76 y.o. male.  Pt presents to the ED today with back pain.  He said it started yesterday.  He is unable to lie flat or sit.  He does have a hx of chronic back pain for which he takes oxycodone 10 mg.  That did not help.  The pt denies any pain going down his leg or into his abdomen.  No new trauma.      Past Medical History:  Diagnosis Date  . Arthritis   . Cancer ([place])   . Carpal tunnel syndrome   . Cataract    left eye  . Chronic back pain    multiple back surgeries  . Complication of anesthesia    pt states woke up during hemorroid surgery  . Constipation    Metamucil every mornind  . DVT (deep venous thrombosis) ([place]) 03/2007   left leg   . GERD (gastroesophageal reflux disease)    takes Protonix daily  . Glaucoma   . History of colon polyps   . History of hyperglycemia   . History of MRSA infection 1992  . HTN (hypertension)    takes  Ramipril daily  . Hyperlipidemia    takes Pravastatin 3 times a week  . Joint pain   . Joint swelling   . Nocturia   . Paroxysmal atrial fibrillation ([facility])    takes Pradaxa daily, Chads2vasc score 2(07/11/14)  . Pneumonia    many yrs ago  . Pulmonary embolism ([place]) 2008  . Urinary frequency   . Urinary urgency     Patient Active Problem List   Diagnosis Date Noted  . Aortic atherosclerosis ([place]) 11/05/2016  . History of colonic polyps 06/16/2016  . Pseudoarthrosis of lumbar spine 04/24/2014  . Hx of adenomatous colonic polyps 11/22/2013  . Prediabetes 10/12/2013  . Fatigue 12/21/2012  . Snoring 12/21/2012  . Chronic back pain greater than 3 months duration 10/31/2012  . Sinusitis 02/22/2012  . GERD (gastroesophageal reflux disease) 02/22/2012  . Hyperlipidemia 01/21/2012  . HYPERTENSION, BENIGN 12/06/2009  . ATRIAL  FIBRILLATION, PAROXYSMAL 12/06/2009    Past Surgical History:  Procedure Laterality Date  . CARDIAC CATHETERIZATION  2008  . CARPAL TUNNEL RELEASE Right   . cataract surgery Right   . cervical and lumbar surgery    . COLONOSCOPY  04/24/2011   Procedure: COLONOSCOPY;  Surgeon: Rogene Houston, MD;  Location: [facility];  Service: Endoscopy;  Laterality: N/A;  1200  . COLONOSCOPY N/A 08/13/2016   Procedure: COLONOSCOPY;  Surgeon: Rogene Houston, MD;  Location: [facility];  Service: Endoscopy;  Laterality: N/A;  1200  . ESOPHAGOGASTRODUODENOSCOPY  03/10/2012   Procedure: ESOPHAGOGASTRODUODENOSCOPY (EGD);  Surgeon: Rogene Houston, MD;  Location: [facility];  Service: Endoscopy;  Laterality: N/A;  325  . HARDWARE REMOVAL N/A 04/24/2014   Procedure: REMOVAL OF SACRAL INSTRUMENTATION WITH [place];  Surgeon: Kristeen Miss, MD;  Location: [facility];  Service: Neurosurgery;  Laterality: N/A;  . HEMORRHOIDECTOMY WITH HEMORRHOID BANDING    . KNEE ARTHROSCOPY Right   . KNEE SURGERY     left  . LASIK    . POLYPECTOMY  08/13/2016   Procedure: POLYPECTOMY;  Surgeon: Rogene Houston, MD;  Location: [facility];  Service: Endoscopy;;  colon  . PVI  01/24/10   afib ablation       Home Medications    Prior to Admission medications   Medication Sig Start Date End Date Taking? Authorizing Provider  acetaminophen (TYLENOL) 500 MG tablet Take 1,000 mg by mouth every 8 (eight) hours as needed for headache (pain). Reported on 06/07/2015   Yes [provider]  amLODipine (NORVASC) 5 MG tablet Take 1 tablet (5 mg total) by mouth daily. 04/14/16  Yes Kathyrn Drown, MD  dabigatran (PRADAXA) 150 MG CAPS capsule TAKE ONE CAPSULE BY MOUTH EVERY 12 HOURS 08/14/16  Yes Rehman, Najeeb U, MD  indapamide (LOZOL) 2.5 MG tablet Take 1 tablet (2.5 mg total) by mouth daily. 04/14/16  Yes Luking, Scott A, MD  latanoprost (XALATAN) 0.005 % ophthalmic solution Place 1 drop into both eyes at bedtime.  03/24/16  Yes [provider]  Oxycodone HCl 10 MG TABS One 4 times a day prn pain Patient taking differently: Twice a day 10/27/16  Yes Luking, Scott A, MD  pantoprazole (PROTONIX) 40 MG tablet TAKE 1 TABLET (40 MG TOTAL) BY MOUTH DAILY. Patient taking differently: Take 40 mg by mouth daily.  12/05/15  Yes Setzer, Terri L, NP  pravastatin (PRAVACHOL) 80 MG tablet Take 1 tablet (80 mg total) by mouth daily. Patient taking differently: Take 80 mg by mouth every evening.  06/05/16  Yes Kathyrn Drown, MD  ramipril (ALTACE) 10 MG capsule Take 1 capsule (10 mg total) by mouth 2 (two) times daily. 04/14/16  Yes Luking, Elayne Snare, MD  diazepam (VALIUM) 5 MG tablet Take 1 tablet (5 mg total) by mouth 2 (two) times daily. 12/21/16   Isla Pence, MD  predniSONE (STERAPRED UNI-PAK 21 TAB) 10 MG (21) TBPK tablet Take 6 tabs by mouth daily  for 2 days, then 5 tabs for 2 days, then 4 tabs for 2 days, then 3 tabs for 2 days, 2 tabs for 2 days, then 1 tab by mouth daily for 2 days 12/21/16   Isla Pence, MD    Family History Family History  Problem Relation Age of Onset  . Hypertension Mother   . Transient ischemic attack Mother   . Heart disease Father   . Diabetes Brother   . Healthy Daughter   . Colon cancer Sister   . Hypertension Unknown   . Heart disease Unknown     Social History Social History  Substance Use Topics  . Smoking status: Never Smoker  . Smokeless tobacco: Never Used  . Alcohol use Yes     Comment: wine nightly     Allergies   Sectral [acebutolol hcl]; Sulfonamide derivatives; and Zocor [simvastatin]   Review of Systems Review of Systems  Musculoskeletal: Positive for back pain.  All other systems reviewed and are negative.    Physical Exam Updated Vital Signs Pulse 60   Temp 97.8 F (36.6 C) (Oral)   Resp 16   Ht 5\' 6"  (1.676 m)   Wt 83.9 kg (185 lb)   SpO2 100%   BMI 29.86 kg/m   Physical Exam  Constitutional: He is oriented to person, place,  and time. He appears well-developed. He appears distressed.  HENT:  Head: Normocephalic and atraumatic.  Right Ear: External ear normal.  Left Ear: External ear normal.  Nose: Nose normal.  Mouth/Throat: Oropharynx is clear and moist.  Eyes: Pupils are equal, round, and reactive to light. Conjunctivae and EOM are normal.  Neck: Normal range of motion. Neck  supple.  Cardiovascular: Normal rate, regular rhythm, normal heart sounds and intact distal pulses.   Pulmonary/Chest: Effort normal and breath sounds normal.  Abdominal: Soft. Bowel sounds are normal.  Musculoskeletal:       Lumbar back: He exhibits tenderness.  Neurological: He is alert and oriented to person, place, and time.  Skin: Skin is warm.  Psychiatric: He has a normal mood and affect. His behavior is normal. Judgment and thought content normal.  Nursing note and vitals reviewed.    ED Treatments / Results  Labs (all labs ordered are listed, but only abnormal results are displayed) Labs Reviewed  COMPREHENSIVE METABOLIC PANEL - Abnormal; Notable for the following:       Result Value   Chloride 99 (*)    Glucose, Bld 130 (*)    All other components within normal limits  CBC WITH DIFFERENTIAL/PLATELET  URINALYSIS, ROUTINE W REFLEX MICROSCOPIC    EKG  EKG Interpretation None       Radiology No results found.  Procedures Procedures (including critical care time)  Medications Ordered in ED Medications  morphine 4 MG/ML injection 4 mg (4 mg Intravenous Given 12/21/16 1115)  ondansetron (ZOFRAN) injection 4 mg (4 mg Intravenous Given 12/21/16 1116)  HYDROmorphone (DILAUDID) injection 1 mg (1 mg Intravenous Given 12/21/16 1205)  ketorolac (TORADOL) 30 MG/ML injection 30 mg (30 mg Intravenous Given 12/21/16 1302)  LORazepam (ATIVAN) injection 1 mg (1 mg Intravenous Given 12/21/16 1302)  dexamethasone (DECADRON) injection 10 mg (10 mg Intravenous Given 12/21/16 1302)  LORazepam (ATIVAN) injection 2 mg (2 mg Intravenous  Given 12/21/16 1432)  HYDROmorphone (DILAUDID) injection 2 mg (2 mg Intravenous Given 12/21/16 1505)     Initial Impression / Assessment and Plan / ED Course  I have reviewed the triage vital signs and the nursing notes.  Pertinent labs & imaging results that were available during my care of the patient were reviewed by me and considered in my medical decision making (see chart for details).     Pt's back is still hurting quite a bit despite the IV pain meds and ativan.  The pt is offered admission, but does not want to stay.  He said he would feel better at home.  The pt said he's had this same problem in the past and it gets better after rest.  The pt knows to return at any time if sx worsen.  Final Clinical Impressions(s) / ED Diagnoses   Final diagnoses:  Acute left-sided low back pain without sciatica    New Prescriptions New Prescriptions   DIAZEPAM (VALIUM) 5 MG TABLET    Take 1 tablet (5 mg total) by mouth 2 (two) times daily.   PREDNISONE (STERAPRED UNI-PAK 21 TAB) 10 MG (21) TBPK TABLET    Take 6 tabs by mouth daily  for 2 days, then 5 tabs for 2 days, then 4 tabs for 2 days, then 3 tabs for 2 days, 2 tabs for 2 days, then 1 tab by mouth daily for 2 days     Isla Pence, MD 12/21/16 1554

## 2016-12-21 NOTE — ED Triage Notes (Signed)
Patient complains of lower back pain starting yesterday. States unable to lie flat or sit.

## 2016-12-30 DIAGNOSIS — L570 Actinic keratosis: Secondary | ICD-10-CM | POA: Diagnosis not present

## 2016-12-30 DIAGNOSIS — X32XXXD Exposure to sunlight, subsequent encounter: Secondary | ICD-10-CM | POA: Diagnosis not present

## 2017-01-14 DIAGNOSIS — I10 Essential (primary) hypertension: Secondary | ICD-10-CM | POA: Diagnosis not present

## 2017-01-14 DIAGNOSIS — R109 Unspecified abdominal pain: Secondary | ICD-10-CM | POA: Diagnosis not present

## 2017-01-14 DIAGNOSIS — Z6830 Body mass index (BMI) 30.0-30.9, adult: Secondary | ICD-10-CM | POA: Diagnosis not present

## 2017-01-14 DIAGNOSIS — Z981 Arthrodesis status: Secondary | ICD-10-CM | POA: Diagnosis not present

## 2017-01-22 ENCOUNTER — Ambulatory Visit (INDEPENDENT_AMBULATORY_CARE_PROVIDER_SITE_OTHER): Payer: PPO | Admitting: Urology

## 2017-01-22 DIAGNOSIS — R310 Gross hematuria: Secondary | ICD-10-CM

## 2017-01-22 DIAGNOSIS — R3915 Urgency of urination: Secondary | ICD-10-CM

## 2017-01-22 DIAGNOSIS — N401 Enlarged prostate with lower urinary tract symptoms: Secondary | ICD-10-CM

## 2017-02-05 ENCOUNTER — Other Ambulatory Visit (INDEPENDENT_AMBULATORY_CARE_PROVIDER_SITE_OTHER): Payer: Self-pay | Admitting: Internal Medicine

## 2017-02-16 ENCOUNTER — Ambulatory Visit (HOSPITAL_COMMUNITY)
Admission: RE | Admit: 2017-02-16 | Discharge: 2017-02-16 | Disposition: A | Discharge status: Home / Self Care | Payer: PPO | Source: Ambulatory Visit | Attending: Family Medicine | Admitting: Family Medicine

## 2017-02-16 ENCOUNTER — Ambulatory Visit (INDEPENDENT_AMBULATORY_CARE_PROVIDER_SITE_OTHER): Payer: PPO | Admitting: Family Medicine

## 2017-02-16 VITALS — BP 122/86 | Ht 66.0 in | Wt 187.8 lb

## 2017-02-16 DIAGNOSIS — I10 Essential (primary) hypertension: Secondary | ICD-10-CM | POA: Diagnosis not present

## 2017-02-16 DIAGNOSIS — E7849 Other hyperlipidemia: Secondary | ICD-10-CM | POA: Diagnosis not present

## 2017-02-16 DIAGNOSIS — R0781 Pleurodynia: Secondary | ICD-10-CM | POA: Diagnosis not present

## 2017-02-16 DIAGNOSIS — Z23 Encounter for immunization: Secondary | ICD-10-CM

## 2017-02-16 MED ORDER — OXYCODONE HCL 10 MG PO TABS: ORAL_TABLET | ORAL | 0 refills | Status: DC

## 2017-02-16 NOTE — Progress Notes (Signed)
   Subjective:    Patient ID: [name], male    DOB: Feb 28, 1941, 76 y.o.   MRN: [id number]  HPI This patient states he hurt his left side while working on top he felt a sharp pain with a and made him worried that he developed a blood clot he states that he had pain like this before when he had a blood clot he is currently on anticoagulant is heard you can still have a blood clot while on anticoagulant. Also states recently his pain has been getting better also states the pain was worse because of his tenderness certain spot certain movements of hurt. Patient hurt his left side while on top of a roof installing a light last Wednesday. Review of Systems Denies using shortness of breath denies nausea vomiting denies hemoptysis no chest tightness pressure pain denies rectal bleeding or abnormal bruising    Objective:   Physical Exam Neck no abnormal masses are felt lungs are clear no crackles respiratory rate normal heart rate is controlled no murmurs extremities no edema patient no distress       Assessment & Plan:  Left rib pain discomfort x-rays ordered I doubt blood clot based on symptomatologyGiven is sharp pain and pain with movement If not better by next week notify us may need CT scan Pain registry was checked 2 prescriptions given Lab work ordered Follow-up in 8 weeks for standard medical visit chronic pain visit. If worse follow-up sooner

## 2017-02-24 ENCOUNTER — Other Ambulatory Visit: Payer: Self-pay | Admitting: Family Medicine

## 2017-02-26 DIAGNOSIS — E7849 Other hyperlipidemia: Secondary | ICD-10-CM | POA: Diagnosis not present

## 2017-02-26 DIAGNOSIS — I10 Essential (primary) hypertension: Secondary | ICD-10-CM | POA: Diagnosis not present

## 2017-02-28 ENCOUNTER — Encounter: Payer: Self-pay | Admitting: Family Medicine

## 2017-02-28 LAB — LIPID PANEL
Chol/HDL Ratio: 3.7 ratio (ref 0.0–5.0)
Cholesterol, Total: 167 mg/dL (ref 100–199)
HDL: 45 mg/dL (ref >39)
LDL Calculated: 99 mg/dL (ref 0–99)
Triglycerides: 115 mg/dL (ref 0–149)
VLDL Cholesterol Cal: 23 mg/dL (ref 5–40)

## 2017-02-28 LAB — HEPATIC FUNCTION PANEL
ALT: 17 IU/L (ref 0–44)
AST: 23 IU/L (ref 0–40)
Albumin: 4.8 g/dL (ref 3.5–4.8)
Alkaline Phosphatase: 72 IU/L (ref 39–117)
Bilirubin Total: 0.6 mg/dL (ref 0.0–1.2)
Bilirubin, Direct: 0.18 mg/dL (ref 0.00–0.40)
Total Protein: 6.6 g/dL (ref 6.0–8.5)

## 2017-03-03 ENCOUNTER — Other Ambulatory Visit (HOSPITAL_COMMUNITY): Payer: Self-pay | Admitting: *Deleted

## 2017-03-03 MED ORDER — DABIGATRAN ETEXILATE MESYLATE 150 MG PO CAPS: ORAL_CAPSULE | ORAL | 2 refills | Status: DC

## 2017-04-15 ENCOUNTER — Other Ambulatory Visit: Payer: Self-pay

## 2017-04-15 MED ORDER — AMLODIPINE BESYLATE 5 MG PO TABS: 5.0000 mg | ORAL_TABLET | Freq: Every day | ORAL | 1 refills | Status: DC

## 2017-04-16 DIAGNOSIS — H401132 Primary open-angle glaucoma, bilateral, moderate stage: Secondary | ICD-10-CM | POA: Diagnosis not present

## 2017-04-28 ENCOUNTER — Ambulatory Visit: Payer: PPO | Admitting: Family Medicine

## 2017-04-30 ENCOUNTER — Ambulatory Visit: Payer: PPO | Admitting: Urology

## 2017-04-30 DIAGNOSIS — R310 Gross hematuria: Secondary | ICD-10-CM

## 2017-04-30 DIAGNOSIS — R3915 Urgency of urination: Secondary | ICD-10-CM

## 2017-04-30 DIAGNOSIS — R351 Nocturia: Secondary | ICD-10-CM

## 2017-04-30 DIAGNOSIS — N401 Enlarged prostate with lower urinary tract symptoms: Secondary | ICD-10-CM

## 2017-05-06 ENCOUNTER — Other Ambulatory Visit: Payer: Self-pay | Admitting: Family Medicine

## 2017-05-17 ENCOUNTER — Other Ambulatory Visit: Payer: Self-pay | Admitting: Family Medicine

## 2017-05-17 ENCOUNTER — Ambulatory Visit: Payer: PPO | Admitting: Family Medicine

## 2017-05-17 ENCOUNTER — Encounter: Payer: Self-pay | Admitting: Family Medicine

## 2017-05-17 VITALS — BP 128/86 | Ht 66.0 in | Wt 188.2 lb

## 2017-05-17 DIAGNOSIS — Z125 Encounter for screening for malignant neoplasm of prostate: Secondary | ICD-10-CM | POA: Diagnosis not present

## 2017-05-17 DIAGNOSIS — M549 Dorsalgia, unspecified: Secondary | ICD-10-CM | POA: Diagnosis not present

## 2017-05-17 DIAGNOSIS — R7303 Prediabetes: Secondary | ICD-10-CM

## 2017-05-17 DIAGNOSIS — E7849 Other hyperlipidemia: Secondary | ICD-10-CM

## 2017-05-17 DIAGNOSIS — I10 Essential (primary) hypertension: Secondary | ICD-10-CM

## 2017-05-17 DIAGNOSIS — G8929 Other chronic pain: Secondary | ICD-10-CM | POA: Diagnosis not present

## 2017-05-17 MED ORDER — OXYCODONE HCL 10 MG PO TABS: ORAL_TABLET | ORAL | 0 refills | Status: DC

## 2017-05-17 NOTE — Progress Notes (Signed)
Subjective:    Patient ID: [name], male    DOB: Feb 01, 1941, 76 y.o.   MRN: [id number]   Hypertension   This is a chronic problem. The current episode started more than 1 year ago. Pertinent negatives include no chest pain, headaches or shortness of breath. Risk factors for coronary artery disease include male gender and dyslipidemia. Treatments tried: altace, norvasc, metoprolol.  There are no compliance problems.     Patient with c/o pain and numbness in hands-worse in the left hand-affects the index middle and thumb  Patient had an episode of itching on hands Friday but now better- lasted a couple days Got better after a shower  Severe sx for past few months  Worse at night and with activity  This patient was seen today for chronic pain  The medication list was reviewed and updated.   -Compliance with medication: Patient takes anywhere between 2 and 4/day  - Number patient states they take daily: 2-4/day  -when was the last dose patient took?  Earlier today  The patient was advised the importance of maintaining medication and not using illegal substances with these.  Refills needed: Does need refills  The patient was educated that we can provide 3 monthly scripts for their medication, it is their responsibility to follow the instructions.  Side effects or complications from medications: Denies side effects or problems  Patient is aware that pain medications are meant to minimize the severity of the pain to allow their pain levels to improve to allow for better function. They are aware of that pain medications cannot totally remove their pain.  Due for UDT ( at least once per year) : On a yearly basis  Pain medications allows his back pain and arthralgia to minimize to the point where he is more functional denies abuse or addiction  Patient states he occasionally share some with his wife who has severe back pain I encouraged him not to do this and that it is  necessary for her to come and be seen for her own issues and prescribed medicine if indicated      Review of Systems  Constitutional: Negative for activity change, fatigue and fever.  Respiratory: Negative for cough and shortness of breath.   Cardiovascular: Negative for chest pain and leg swelling.  Musculoskeletal: Positive for arthralgias.  Neurological: Negative for headaches.       Objective:   Physical Exam  Constitutional: He appears well-nourished.  Cardiovascular: Normal rate, regular rhythm and normal heart sounds.  No murmur heard. Pulmonary/Chest: Effort normal and breath sounds normal.  Musculoskeletal: He exhibits no edema.  Lymphadenopathy:    He has no cervical adenopathy.  Neurological: He is alert.  Psychiatric: His behavior is normal.  Vitals reviewed.         Assessment & Plan:  Possible carpal tunnel he is Artie had surgery on the right side but his symptomatology is worse on the left versus right he will use braces at nighttime in addition to this the patient does need nerve conduction studies we will send this information to his neurosurgeon who he has an appointment with on January 9-hopefully patient would benefit from having nerve conduction studies I believe the specialist already has someone within their group who can do this  HTN- Patient was seen today as part of a visit regarding hypertension. The importance of healthy diet and regular physical activity was discussed. The importance of compliance with medications discussed. Ideal goal is to keep blood  pressure low elevated levels certainly below 426/83 when possible. The patient was counseled that keeping blood pressure under control lessen his risk of heart attack, stroke, kidney failure, and early death. The importance of regular follow-ups was discussed with the patient. Low-salt diet such as DASH recommended. Regular physical activity was recommended as well. Patient was advised to keep regular  follow-ups.  The patient was seen today as part of an evaluation regarding hyperlipidemia. Recent lab work has been reviewed with the patient as well as the goals for good cholesterol care. In addition to this medications have been discussed the importance of compliance with diet and medications discussed as well. Patient has been informed of potential side effects of medications in the importance to notify us should any problems occur. Finally the patient is aware that poor control of cholesterol, noncompliance can dramatically increase her risk of heart attack strokes and premature death. The patient will keep regular office visits and the patient does agreed to periodic lab work.  The patient was seen today as part of a comprehensive visit regarding pain control. Patient's compliance with the medication as well as discussion regarding effectiveness was completed. Prescriptions were written. Patient was advised to follow-up in 3 months. The patient was assessed for any signs of severe side effects. The patient was advised to take the medicine as directed and to report to [place] if any side effect issues.  Prediabetes the importance of watching diet and exercising we will check A1c all this discussed  25 minutes was spent with the patient. Greater than half the time was spent in discussion and answering questions and counseling regarding the issues that the patient came in for today.

## 2017-05-24 DIAGNOSIS — Z125 Encounter for screening for malignant neoplasm of prostate: Secondary | ICD-10-CM | POA: Diagnosis not present

## 2017-05-24 DIAGNOSIS — R7303 Prediabetes: Secondary | ICD-10-CM | POA: Diagnosis not present

## 2017-05-24 DIAGNOSIS — I10 Essential (primary) hypertension: Secondary | ICD-10-CM | POA: Diagnosis not present

## 2017-05-25 ENCOUNTER — Encounter: Payer: Self-pay | Admitting: Family Medicine

## 2017-05-25 LAB — BASIC METABOLIC PANEL
BUN/Creatinine Ratio: 24 (ref 10–24)
BUN: 23 mg/dL (ref 8–27)
CO2: 24 mmol/L (ref 20–29)
Calcium: 9.7 mg/dL (ref 8.6–10.2)
Chloride: 102 mmol/L (ref 96–106)
Creatinine, Ser: 0.96 mg/dL (ref 0.76–1.27)
GFR calc Af Amer: 88 mL/min/{1.73_m2} (ref >59)
GFR calc non Af Amer: 76 mL/min/{1.73_m2} (ref >59)
Glucose: 120 mg/dL — ABNORMAL HIGH (ref 65–99)
Potassium: 4.5 mmol/L (ref 3.5–5.2)
Sodium: 143 mmol/L (ref 134–144)

## 2017-05-25 LAB — HEMOGLOBIN A1C
Est. average glucose Bld gHb Est-mCnc: 128 mg/dL
Hgb A1c MFr Bld: 6.1 % — ABNORMAL HIGH (ref 4.8–5.6)

## 2017-05-25 LAB — PSA: Prostate Specific Ag, Serum: 2 ng/mL (ref 0.0–4.0)

## 2017-05-26 DIAGNOSIS — G5602 Carpal tunnel syndrome, left upper limb: Secondary | ICD-10-CM | POA: Diagnosis not present

## 2017-06-05 ENCOUNTER — Other Ambulatory Visit: Payer: Self-pay | Admitting: Family Medicine

## 2017-08-13 ENCOUNTER — Ambulatory Visit: Payer: PPO | Admitting: Family Medicine

## 2017-08-13 ENCOUNTER — Ambulatory Visit (INDEPENDENT_AMBULATORY_CARE_PROVIDER_SITE_OTHER): Payer: PPO | Admitting: Family Medicine

## 2017-08-13 ENCOUNTER — Encounter: Payer: Self-pay | Admitting: Family Medicine

## 2017-08-13 VITALS — BP 136/76 | Ht 66.0 in | Wt 191.8 lb

## 2017-08-13 DIAGNOSIS — I10 Essential (primary) hypertension: Secondary | ICD-10-CM

## 2017-08-13 DIAGNOSIS — N23 Unspecified renal colic: Secondary | ICD-10-CM | POA: Diagnosis not present

## 2017-08-13 DIAGNOSIS — Z79891 Long term (current) use of opiate analgesic: Secondary | ICD-10-CM | POA: Diagnosis not present

## 2017-08-13 DIAGNOSIS — E7849 Other hyperlipidemia: Secondary | ICD-10-CM

## 2017-08-13 LAB — POCT URINALYSIS DIPSTICK
Spec Grav, UA: 1.01 (ref 1.010–1.025)
pH, UA: 7 (ref 5.0–8.0)

## 2017-08-13 MED ORDER — OXYCODONE HCL 10 MG PO TABS: ORAL_TABLET | ORAL | 0 refills | Status: DC

## 2017-08-13 MED ORDER — AMLODIPINE BESYLATE 5 MG PO TABS: 5.0000 mg | ORAL_TABLET | Freq: Every day | ORAL | 1 refills | Status: DC

## 2017-08-13 MED ORDER — PRAVASTATIN SODIUM 80 MG PO TABS: 80.0000 mg | ORAL_TABLET | Freq: Every day | ORAL | 1 refills | Status: DC

## 2017-08-13 MED ORDER — RAMIPRIL 10 MG PO CAPS: 10.0000 mg | ORAL_CAPSULE | Freq: Two times a day (BID) | ORAL | 1 refills | Status: DC

## 2017-08-13 NOTE — Progress Notes (Signed)
Subjective:    Patient ID: [name], male    DOB: July 13, 1940, 77 y.o.   MRN: [id number]  HPI This patient was seen today for chronic pain. Takes for arthritis pain.  The patient is tolerating the pain medicine well takes it several times per day denies feeling drowsy with it denies feeling drug.  Denies abusing it.  Keeps it in a safe spot.  Pain contract reviewed. The medication list was reviewed and updated.   -Compliance with medication: yes  - Number patient states they take daily: one four times a day  -when was the last dose patient took? today  The patient was advised the importance of maintaining medication and not using illegal substances with these.  Here for refills and follow up  The patient was educated that we can provide 3 monthly scripts for their medication, it is their responsibility to follow the instructions.  Side effects or complications from medications: none  Patient is aware that pain medications are meant to minimize the severity of the pain to allow their pain levels to improve to allow for better function. They are aware of that pain medications cannot totally remove their pain.  Due for UDT ( at least once per year) : due today  Was having pain in kidney area about one week ago. Pain went away but pt wants to check for infection since he is getting a urine sample for Urine drug screen.    Results for orders placed or performed in visit on 08/13/17  POCT urinalysis dipstick  Result Value Ref Range   Color, UA     Clarity, UA     Glucose, UA     Bilirubin, UA     Ketones, UA     Spec Grav, UA 1.010 1.010 - 1.025   Blood, UA     pH, UA 7.0 5.0 - 8.0   Protein, UA     Urobilinogen, UA  0.2 or 1.0 E.U./dL   Nitrite, UA     Leukocytes, UA  Negative   Appearance     Odor       Review of Systems  Constitutional: Negative for activity change.  HENT: Negative for congestion and rhinorrhea.   Respiratory: Negative for cough and shortness of  breath.   Cardiovascular: Negative for chest pain.  Gastrointestinal: Negative for abdominal pain, diarrhea, nausea and vomiting.  Genitourinary: Negative for dysuria and hematuria.  Musculoskeletal: Positive for arthralgias and back pain.  Neurological: Negative for weakness and headaches.  Psychiatric/Behavioral: Negative for confusion.       Objective:   Physical Exam  Constitutional: He appears well-nourished.  Cardiovascular: Normal rate, regular rhythm and normal heart sounds.  No murmur heard. Pulmonary/Chest: Effort normal and breath sounds normal.  Musculoskeletal: He exhibits no edema.  Lymphadenopathy:    He has no cervical adenopathy.  Neurological: He is alert.  Psychiatric: His behavior is normal.  Vitals reviewed.  His urinalysis was negative his abdominal exam was benign flank nontender I think this was more musculoskeletal should gradually get better if ongoing troubles or if worse follow-up       Assessment & Plan:  Significant arthritis issues Comprehensive lab work for cholesterol and metabolic 7 before next visit Continue current medication Because of anticoagulant for his A. fib he cannot be on any type of NSAIDs A. fib stable The patient was seen today as part of a comprehensive visit regarding pain control. Patient's compliance with the medication as well as discussion  regarding effectiveness was completed. Prescriptions were written. Patient was advised to follow-up in 3 months. The patient was assessed for any signs of severe side effects. The patient was advised to take the medicine as directed and to report to [place] if any side effect issues. Prescriptions were given drug registry checked follow-up 3 months

## 2017-08-19 LAB — TOXASSURE SELECT 13 (MW), URINE

## 2017-08-23 DIAGNOSIS — T1511XA Foreign body in conjunctival sac, right eye, initial encounter: Secondary | ICD-10-CM | POA: Diagnosis not present

## 2017-09-08 ENCOUNTER — Ambulatory Visit (HOSPITAL_COMMUNITY)
Admission: RE | Admit: 2017-09-08 | Discharge: 2017-09-08 | Disposition: A | Discharge status: Home / Self Care | Payer: PPO | Source: Ambulatory Visit | Attending: Nurse Practitioner | Admitting: Nurse Practitioner

## 2017-09-08 ENCOUNTER — Encounter (HOSPITAL_COMMUNITY): Payer: Self-pay | Admitting: Nurse Practitioner

## 2017-09-08 VITALS — BP 140/72 | HR 61 | Ht 66.0 in | Wt 192.0 lb

## 2017-09-08 DIAGNOSIS — Z7901 Long term (current) use of anticoagulants: Secondary | ICD-10-CM | POA: Diagnosis not present

## 2017-09-08 DIAGNOSIS — Z86718 Personal history of other venous thrombosis and embolism: Secondary | ICD-10-CM | POA: Diagnosis not present

## 2017-09-08 DIAGNOSIS — Z833 Family history of diabetes mellitus: Secondary | ICD-10-CM | POA: Insufficient documentation

## 2017-09-08 DIAGNOSIS — Z882 Allergy status to sulfonamides status: Secondary | ICD-10-CM | POA: Diagnosis not present

## 2017-09-08 DIAGNOSIS — M1990 Unspecified osteoarthritis, unspecified site: Secondary | ICD-10-CM | POA: Diagnosis not present

## 2017-09-08 DIAGNOSIS — Z8249 Family history of ischemic heart disease and other diseases of the circulatory system: Secondary | ICD-10-CM | POA: Diagnosis not present

## 2017-09-08 DIAGNOSIS — Z8614 Personal history of Methicillin resistant Staphylococcus aureus infection: Secondary | ICD-10-CM | POA: Insufficient documentation

## 2017-09-08 DIAGNOSIS — H409 Unspecified glaucoma: Secondary | ICD-10-CM | POA: Insufficient documentation

## 2017-09-08 DIAGNOSIS — Z86711 Personal history of pulmonary embolism: Secondary | ICD-10-CM | POA: Diagnosis not present

## 2017-09-08 DIAGNOSIS — Z9889 Other specified postprocedural states: Secondary | ICD-10-CM | POA: Diagnosis not present

## 2017-09-08 DIAGNOSIS — R5383 Other fatigue: Secondary | ICD-10-CM | POA: Insufficient documentation

## 2017-09-08 DIAGNOSIS — Z888 Allergy status to other drugs, medicaments and biological substances status: Secondary | ICD-10-CM | POA: Insufficient documentation

## 2017-09-08 DIAGNOSIS — E785 Hyperlipidemia, unspecified: Secondary | ICD-10-CM | POA: Insufficient documentation

## 2017-09-08 DIAGNOSIS — I10 Essential (primary) hypertension: Secondary | ICD-10-CM | POA: Insufficient documentation

## 2017-09-08 DIAGNOSIS — K219 Gastro-esophageal reflux disease without esophagitis: Secondary | ICD-10-CM | POA: Diagnosis not present

## 2017-09-08 DIAGNOSIS — G5600 Carpal tunnel syndrome, unspecified upper limb: Secondary | ICD-10-CM | POA: Diagnosis not present

## 2017-09-08 DIAGNOSIS — Z79899 Other long term (current) drug therapy: Secondary | ICD-10-CM | POA: Diagnosis not present

## 2017-09-08 DIAGNOSIS — Z86010 Personal history of colonic polyps: Secondary | ICD-10-CM | POA: Insufficient documentation

## 2017-09-08 DIAGNOSIS — I480 Paroxysmal atrial fibrillation: Secondary | ICD-10-CM | POA: Diagnosis not present

## 2017-09-08 NOTE — Progress Notes (Signed)
Patient ID: [name], male   DOB: [birth year], 77 y.o.   MRN: [id number]     Primary Care Physician: Kathyrn Drown, MD Referring Physician: [name] LORON [name] is a 77 y.o. male with a h/o PAF. Marland Kitchen No afib other than around 12 hours on Christmas Day, 2017, after hearing about the death of his sister. He continues to take pradaxa on a  regular basis.  F/u 07/15/16. He reports just a few flutters now and then. He remains very active and actually had a rupture of his bicep tendon last September after digging up and pitching a rock about the size of a "coconut.". Surgery was not recommended due to his age and it has repaired to some degree with time. He continues on pradaxa with last bmet drawn 06/02/16 with creatinine of 1.10. No bleeding issues.   F/u afib clinic, 09/08/17. No further afib since December 2017. No issues with anticoagulation. Last bmet with stable creatinine with at 0.96.his only complaint is that he can not do as much work outside as he was doing last Spring." I just give out and have to stop." No chest pain or shortness of breath. Has a freind that felt the same way and he was found to need a stent. His energy improved a lot after the procedure.  Today, he denies symptoms of palpitations, chest pain, shortness of breath, orthopnea, PND, lower extremity edema, dizziness, presyncope, syncope, or neurologic sequela. + for exertional fatigue. The patient is tolerating medications without difficulties and is otherwise without complaint today.   Past Medical History:  Diagnosis Date  . Arthritis   . Cancer ([place])   . Carpal tunnel syndrome   . Cataract    left eye  . Chronic back pain    multiple back surgeries  . Complication of anesthesia    pt states woke up during hemorroid surgery  . Constipation    Metamucil every mornind  . DVT (deep venous thrombosis) ([place]) 03/2007   left leg   . GERD (gastroesophageal reflux disease)    takes Protonix daily  . Glaucoma   .  History of colon polyps   . History of hyperglycemia   . History of MRSA infection 1992  . HTN (hypertension)    takes  Ramipril daily  . Hyperlipidemia    takes Pravastatin 3 times a week  . Joint pain   . Joint swelling   . Nocturia   . Paroxysmal atrial fibrillation (HCC)    takes Pradaxa daily, Chads2vasc score 2(07/11/14)  . Pneumonia    many yrs ago  . Pulmonary embolism ([place]) 2008  . Urinary frequency   . Urinary urgency    Past Surgical History:  Procedure Laterality Date  . CARDIAC CATHETERIZATION  2008  . CARPAL TUNNEL RELEASE Right   . cataract surgery Right   . cervical and lumbar surgery    . COLONOSCOPY  04/24/2011   Procedure: COLONOSCOPY;  Surgeon: Rogene Houston, MD;  [facility];  Service: Endoscopy;  Laterality: N/A;  1200  . COLONOSCOPY N/A 08/13/2016   Procedure: COLONOSCOPY;  Surgeon: Rogene Houston, MD;  [facility];  Service: Endoscopy;  Laterality: N/A;  1200  . ESOPHAGOGASTRODUODENOSCOPY  03/10/2012   Procedure: ESOPHAGOGASTRODUODENOSCOPY (EGD);  Surgeon: Rogene Houston, MD;  [facility];  Service: Endoscopy;  Laterality: N/A;  325  . HARDWARE REMOVAL N/A 04/24/2014   Procedure: REMOVAL OF SACRAL INSTRUMENTATION WITH [place];  Surgeon:  Kristeen Miss, MD;  Location: [facility];  Service: Neurosurgery;  Laterality: N/A;  . HEMORRHOIDECTOMY WITH HEMORRHOID BANDING    . KNEE ARTHROSCOPY Right   . KNEE SURGERY     left  . LASIK    . POLYPECTOMY  08/13/2016   Procedure: POLYPECTOMY;  Surgeon: Rogene Houston, MD;  [facility];  Service: Endoscopy;;  colon  . PVI  01/24/10   afib ablation    Current Outpatient Medications  Medication Sig Dispense Refill  . alfuzosin (UROXATRAL) 10 MG 24 hr tablet Take 10 mg by mouth at bedtime.    Marland Kitchen amLODipine (NORVASC) 5 MG tablet Take 1 tablet (5 mg total) by mouth daily. 90 tablet 1  . dabigatran (PRADAXA) 150 MG CAPS capsule TAKE ONE CAPSULE BY MOUTH EVERY 12 HOURS 180  capsule 2  . diphenhydrAMINE-APAP, sleep, (TYLENOL PM EXTRA STRENGTH PO) Take by mouth. One at night    . indapamide (LOZOL) 2.5 MG tablet TAKE 1 TABLET (2.5 MG TOTAL) BY MOUTH DAILY. 90 tablet 1  . latanoprost (XALATAN) 0.005 % ophthalmic solution Place 1 drop into both eyes at bedtime.    . Oxycodone HCl 10 MG TABS One 4 times a day prn pain 120 tablet 0  . pantoprazole (PROTONIX) 40 MG tablet TAKE 1 TABLET EVERY DAY 90 tablet 4  . pravastatin (PRAVACHOL) 80 MG tablet Take 1 tablet (80 mg total) by mouth daily. 90 tablet 1  . ramipril (ALTACE) 10 MG capsule Take 1 capsule (10 mg total) by mouth 2 (two) times daily. 180 capsule 1  . diazepam (VALIUM) 5 MG tablet Take 1 tablet (5 mg total) by mouth 2 (two) times daily. (Patient not taking: Reported on 09/08/2017) 10 tablet 0   No current facility-administered medications for this encounter.     Allergies  Allergen Reactions  . Sectral [Acebutolol Hcl]     Side effects  . Sulfonamide Derivatives Other (See Comments)    Runs blood pressure up  . Zocor [Simvastatin]     Joint pain    Social History   Socioeconomic History  . Marital status: Married    Spouse name: Not on file  . Number of children: Not on file  . Years of education: Not on file  . Highest education level: Not on file  Occupational History  . Occupation: retired  Scientific laboratory technician  . Financial resource strain: Not on file  . Food insecurity:    Worry: Not on file    Inability: Not on file  . Transportation needs:    Medical: Not on file    Non-medical: Not on file  Tobacco Use  . Smoking status: Never Smoker  . Smokeless tobacco: Never Used  Substance and Sexual Activity  . Alcohol use: Yes    Comment: wine nightly  . Drug use: No  . Sexual activity: Yes  Lifestyle  . Physical activity:    Days per week: Not on file    Minutes per session: Not on file  . Stress: Not on file  Relationships  . Social connections:    Talks on phone: Not on file    Gets  together: Not on file    Attends religious service: Not on file    Active member of club or organization: Not on file    Attends meetings of clubs or organizations: Not on file    Relationship status: Not on file  . Intimate partner violence:    Fear of current or ex  partner: Not on file    Emotionally abused: Not on file    Physically abused: Not on file    Forced sexual activity: Not on file  Other Topics Concern  . Not on file  Social History Narrative  . Not on file    Family History  Problem Relation Age of Onset  . Hypertension Mother   . Transient ischemic attack Mother   . Heart disease Father   . Diabetes Brother   . Healthy Daughter   . Colon cancer Sister   . Hypertension Unknown   . Heart disease Unknown     ROS- All systems are reviewed and negative except as per the HPI above  Physical Exam: Vitals:   09/08/17 1333  BP: 140/72  Pulse: 61  Weight: 192 lb (87.1 kg)  Height: 5\' 6"  (1.676 m)    GEN- The patient is well appearing, alert and oriented x 3 today.   Head- normocephalic, atraumatic Eyes-  Sclera clear, conjunctiva pink Ears- hearing intact Oropharynx- clear Neck- supple, no JVP Lymph- no cervical lymphadenopathy Lungs- Clear to ausculation bilaterally, normal work of breathing Heart- Regular rate and rhythm, no murmurs, rubs or gallops, PMI not laterally displaced GI- soft, NT, ND, + BS Extremities- no clubbing, cyanosis, or edema MS- no significant deformity or atrophy Skin- no rash or lesion Psych- euthymic mood, full affect Neuro- strength and sensation are intact  EKG- NSR with normal EKG  Epic records reviewed  Assessment and Plan: 1. Afib  No specific instances of  afib reported  Has metoprolol tartrate 25 mg as needed for elevated heart rate Continue pradaxa 150 mg bid, creatinine recently checked and stable, chadsvasc score of at least 3  2. Exertional fatigue Quite a change for pt Possible anginal equivalent Will order a  lexi myoview   F/u in one year, sooner if needed  Butch Penny C. Kamillah Didonato, [facility] [address] [place],  [address] [phone]

## 2017-09-09 ENCOUNTER — Telehealth (HOSPITAL_COMMUNITY): Payer: Self-pay | Admitting: Nurse Practitioner

## 2017-09-09 ENCOUNTER — Other Ambulatory Visit (HOSPITAL_COMMUNITY): Payer: Self-pay | Admitting: *Deleted

## 2017-09-09 DIAGNOSIS — X32XXXD Exposure to sunlight, subsequent encounter: Secondary | ICD-10-CM | POA: Diagnosis not present

## 2017-09-09 DIAGNOSIS — D225 Melanocytic nevi of trunk: Secondary | ICD-10-CM | POA: Diagnosis not present

## 2017-09-09 DIAGNOSIS — R0609 Other forms of dyspnea: Principal | ICD-10-CM

## 2017-09-09 DIAGNOSIS — L570 Actinic keratosis: Secondary | ICD-10-CM | POA: Diagnosis not present

## 2017-09-09 DIAGNOSIS — L218 Other seborrheic dermatitis: Secondary | ICD-10-CM | POA: Diagnosis not present

## 2017-09-13 ENCOUNTER — Telehealth (HOSPITAL_COMMUNITY): Payer: Self-pay | Admitting: *Deleted

## 2017-09-13 ENCOUNTER — Telehealth (HOSPITAL_COMMUNITY): Payer: Self-pay | Admitting: Nurse Practitioner

## 2017-09-13 NOTE — Telephone Encounter (Signed)
User: Cherie Dark A Date/time: 09/09/17 9:05 AM  Comment: Called pt and lmsg for him to CB to get scheduled for a myoview.   Context:  Outcome: Left Message  Phone number: [phone] Phone Type: Mobile  Comm. type: Telephone Call type: Outgoing  Contact: Theone Stanley T Relation to patient: Self

## 2017-09-13 NOTE — Telephone Encounter (Signed)
User: Cherie Dark A Date/time: 09/09/17 9:05 AM  Comment: Called pt and lmsg for him to CB to get scheduled for a myoview.   Context:  Outcome: Left Message  Phone number: [phone] Phone Type: Mobile  Comm. type: Telephone Call type: Outgoing  Contact: Theone Stanley T Relation to patient: Self

## 2017-09-13 NOTE — Telephone Encounter (Signed)
Patient given detailed instructions per Myocardial Perfusion Study Information Sheet for the test on 09/15/17 at 0730. Patient notified to arrive 15 minutes early and that it is imperative to arrive on time for appointment to keep from having the test rescheduled.  If you need to cancel or reschedule your appointment, please call the office within 24 hours of your appointment. . Patient verbalized understanding.Spruha Weight, Ranae Palms

## 2017-09-15 ENCOUNTER — Ambulatory Visit (HOSPITAL_COMMUNITY): Payer: PPO | Attending: Cardiology

## 2017-09-15 DIAGNOSIS — R0609 Other forms of dyspnea: Secondary | ICD-10-CM | POA: Diagnosis not present

## 2017-09-15 DIAGNOSIS — E785 Hyperlipidemia, unspecified: Secondary | ICD-10-CM | POA: Diagnosis not present

## 2017-09-15 DIAGNOSIS — I10 Essential (primary) hypertension: Secondary | ICD-10-CM | POA: Insufficient documentation

## 2017-09-15 DIAGNOSIS — I4891 Unspecified atrial fibrillation: Secondary | ICD-10-CM | POA: Insufficient documentation

## 2017-09-15 LAB — MYOCARDIAL PERFUSION IMAGING
LV dias vol: 133 mL (ref 62–150)
LV sys vol: 48 mL
Peak HR: 77 {beats}/min
RATE: 0.32
Rest HR: 56 {beats}/min
SDS: 0
SRS: 2
SSS: 2
TID: 1.02

## 2017-09-15 MED ORDER — REGADENOSON 0.4 MG/5ML IV SOLN
0.4000 mg | Freq: Once | INTRAVENOUS | Status: AC
  Administered 2017-09-15: 0.4 mg via INTRAVENOUS

## 2017-09-15 MED ORDER — TECHNETIUM TC 99M TETROFOSMIN IV KIT
10.5000 | PACK | Freq: Once | INTRAVENOUS | Status: AC | PRN
  Administered 2017-09-15: 10.5 via INTRAVENOUS
  Filled 2017-09-15: qty 11

## 2017-09-15 MED ORDER — TECHNETIUM TC 99M TETROFOSMIN IV KIT
30.2000 | PACK | Freq: Once | INTRAVENOUS | Status: AC | PRN
  Administered 2017-09-15: 30.2 via INTRAVENOUS
  Filled 2017-09-15: qty 31

## 2017-11-08 ENCOUNTER — Other Ambulatory Visit (HOSPITAL_COMMUNITY): Payer: Self-pay | Admitting: Nurse Practitioner

## 2017-11-10 DIAGNOSIS — E7849 Other hyperlipidemia: Secondary | ICD-10-CM | POA: Diagnosis not present

## 2017-11-10 DIAGNOSIS — I10 Essential (primary) hypertension: Secondary | ICD-10-CM | POA: Diagnosis not present

## 2017-11-10 DIAGNOSIS — Z79891 Long term (current) use of opiate analgesic: Secondary | ICD-10-CM | POA: Diagnosis not present

## 2017-11-11 LAB — BASIC METABOLIC PANEL
BUN/Creatinine Ratio: 21 (ref 10–24)
BUN: 20 mg/dL (ref 8–27)
CO2: 25 mmol/L (ref 20–29)
Calcium: 9.6 mg/dL (ref 8.6–10.2)
Chloride: 102 mmol/L (ref 96–106)
Creatinine, Ser: 0.96 mg/dL (ref 0.76–1.27)
GFR calc Af Amer: 88 mL/min/{1.73_m2} (ref >59)
GFR calc non Af Amer: 76 mL/min/{1.73_m2} (ref >59)
Glucose: 119 mg/dL — ABNORMAL HIGH (ref 65–99)
Potassium: 4.7 mmol/L (ref 3.5–5.2)
Sodium: 140 mmol/L (ref 134–144)

## 2017-11-11 LAB — LIPID PANEL
Chol/HDL Ratio: 3 ratio (ref 0.0–5.0)
Cholesterol, Total: 151 mg/dL (ref 100–199)
HDL: 51 mg/dL (ref >39)
LDL Calculated: 81 mg/dL (ref 0–99)
Triglycerides: 95 mg/dL (ref 0–149)
VLDL Cholesterol Cal: 19 mg/dL (ref 5–40)

## 2017-11-11 LAB — HEPATIC FUNCTION PANEL
ALT: 17 IU/L (ref 0–44)
AST: 22 IU/L (ref 0–40)
Albumin: 4.7 g/dL (ref 3.5–4.8)
Alkaline Phosphatase: 74 IU/L (ref 39–117)
Bilirubin Total: 0.4 mg/dL (ref 0.0–1.2)
Bilirubin, Direct: 0.15 mg/dL (ref 0.00–0.40)
Total Protein: 7.1 g/dL (ref 6.0–8.5)

## 2017-11-12 ENCOUNTER — Ambulatory Visit: Payer: PPO | Admitting: Family Medicine

## 2017-11-16 ENCOUNTER — Ambulatory Visit (INDEPENDENT_AMBULATORY_CARE_PROVIDER_SITE_OTHER): Payer: PPO | Admitting: Family Medicine

## 2017-11-16 ENCOUNTER — Encounter: Payer: Self-pay | Admitting: Family Medicine

## 2017-11-16 VITALS — BP 132/82 | Ht 66.0 in | Wt 189.6 lb

## 2017-11-16 DIAGNOSIS — I10 Essential (primary) hypertension: Secondary | ICD-10-CM

## 2017-11-16 DIAGNOSIS — E7849 Other hyperlipidemia: Secondary | ICD-10-CM | POA: Diagnosis not present

## 2017-11-16 DIAGNOSIS — Z79891 Long term (current) use of opiate analgesic: Secondary | ICD-10-CM | POA: Diagnosis not present

## 2017-11-16 DIAGNOSIS — R7303 Prediabetes: Secondary | ICD-10-CM | POA: Diagnosis not present

## 2017-11-16 MED ORDER — PANTOPRAZOLE SODIUM 40 MG PO TBEC: 40.0000 mg | DELAYED_RELEASE_TABLET | Freq: Every day | ORAL | 4 refills | Status: DC

## 2017-11-16 MED ORDER — AMLODIPINE BESYLATE 5 MG PO TABS: 5.0000 mg | ORAL_TABLET | Freq: Every day | ORAL | 1 refills | Status: DC

## 2017-11-16 MED ORDER — RAMIPRIL 10 MG PO CAPS: 10.0000 mg | ORAL_CAPSULE | Freq: Two times a day (BID) | ORAL | 1 refills | Status: DC

## 2017-11-16 MED ORDER — OXYCODONE HCL 10 MG PO TABS: ORAL_TABLET | ORAL | 0 refills | Status: DC

## 2017-11-16 MED ORDER — PRAVASTATIN SODIUM 80 MG PO TABS
80.0000 mg | ORAL_TABLET | Freq: Every day | ORAL | 1 refills | Status: DC
Start: 2017-11-16 — End: 2018-05-25

## 2017-11-16 MED ORDER — INDAPAMIDE 2.5 MG PO TABS: 2.5000 mg | ORAL_TABLET | Freq: Every day | ORAL | 1 refills | Status: DC

## 2017-11-16 NOTE — Progress Notes (Signed)
Subjective:    Patient ID: [name], male    DOB: Aug 23, 1940, 77 y.o.   MRN: [id number]  Hypertension  This is a chronic problem. The current episode started more than 1 year ago. Pertinent negatives include no chest pain, headaches or shortness of breath. Risk factors for coronary artery disease include dyslipidemia and male gender. Treatments tried: norvasc.    Patient here for follow-up regarding cholesterol.  The patient does have hyperlipidemia.  Patient does try to maintain a reasonable diet.  Patient does take the medication on a regular basis.  Denies missing a dose.  The patient denies any obvious side effects.  Prior blood work results reviewed with the patient.  The patient is aware of his cholesterol goals and the need to keep it under good control to lessen the risk of disease.  Prediabetes watch his diet tries to stay physically active does state he could do much better job of cutting back on starches  Patient does have chronic pain.  Patient does relate compliance with taking the medication as directed.  Patient denies negative side effects.  Patient states pain medicine does help with function.  Drug registry was checked.  Patient understands the importance of never driving if feeling sedated.  Patient understands the importance of notifying us if any problems with pain medicine.  Patient denies medication being used by anyone else.  Patient relates medication is kept in a safe spot.  Severe arthritis in hands and there is nothing he can take due to meds  relates a lot of stiffness is been going on for months spring getting worse had previous lab work done about a year ago overall it was not not a sign of any underlying issue other than osteoarthritis Review of Systems  Constitutional: Negative for activity change, appetite change and fatigue.  HENT: Negative for congestion and rhinorrhea.   Respiratory: Negative for cough, chest tightness and shortness of breath.     Cardiovascular: Negative for chest pain and leg swelling.  Gastrointestinal: Negative for abdominal pain, diarrhea and nausea.  Endocrine: Negative for polydipsia and polyphagia.  Genitourinary: Negative for dysuria and hematuria.  Musculoskeletal: Positive for arthralgias and back pain.  Neurological: Negative for weakness and headaches.  Psychiatric/Behavioral: Negative for confusion and dysphoric mood.       Objective:   Physical Exam  Constitutional: He appears well-nourished. No distress.  HENT:  Head: Normocephalic and atraumatic.  Eyes: Right eye exhibits no discharge. Left eye exhibits no discharge.  Neck: No tracheal deviation present.  Cardiovascular: Normal rate and normal heart sounds.  No murmur heard. Pulmonary/Chest: Effort normal and breath sounds normal. No respiratory distress.  Abdominal: Soft. He exhibits no distension.  Musculoskeletal: He exhibits no edema.  Lymphadenopathy:    He has no cervical adenopathy.  Neurological: He is alert.  Psychiatric: His behavior is normal.  Vitals reviewed.         Assessment & Plan:  The patient was seen in followup for chronic pain. A review over at their current pain status was discussed. Drug registry was checked. Prescriptions were given. Discussion was held regarding the importance of compliance with medication as well as pain medication contract.  Time for questions regarding pain management plan occurred. Importance of regular followup visits was discussed. Patient was informed that medication may cause drowsiness and should not be combined  with other medications/alcohol or street drugs. Patient was cautioned that medication could cause drowsiness. If the patient feels medication is causing altered alertness  then do not drive or operate dangerous equipment.  Atrial fib-heart rate under good control continue current measures  Arthritis of the hands debilitating recent sed rate and rheumatoid factors were  negative I do not recommend repeating these again use Tylenol when necessary  Hyperlipidemia previous labs reviewed continue current medications  Hypertension good control continue current measures  Reflux under good control medication continue current measures  Prediabetes the importance of minimizing starches in the diet were discussed in detail the importance of exercise bring weight down discussed  25 minutes was spent with the patient.  This statement verifies that 25 minutes was indeed spent with the patient.  More than 50% of this visit-total duration of the visit-was spent in counseling and coordination of care. The issues that the patient came in for today as reflected in the diagnosis (s) please refer to documentation for further details.

## 2017-12-07 ENCOUNTER — Ambulatory Visit (INDEPENDENT_AMBULATORY_CARE_PROVIDER_SITE_OTHER): Payer: PPO | Admitting: Internal Medicine

## 2017-12-07 ENCOUNTER — Encounter (INDEPENDENT_AMBULATORY_CARE_PROVIDER_SITE_OTHER): Payer: Self-pay | Admitting: Internal Medicine

## 2017-12-07 VITALS — BP 162/82 | HR 64 | Temp 97.9°F | Ht 66.0 in | Wt 192.2 lb

## 2017-12-07 DIAGNOSIS — K219 Gastro-esophageal reflux disease without esophagitis: Secondary | ICD-10-CM

## 2017-12-07 NOTE — Patient Instructions (Signed)
OV in 1 year.  

## 2017-12-07 NOTE — Progress Notes (Signed)
Subjective:    Patient ID: [name], male    DOB: 03-03-1941, 77 y.o.   MRN: [id number] PCP Dr. Rudi Heap.  HPI Here today for f/u. Last seen in July of 2017. Wt in July 2018 185.  States he has arthritis really bad in his hands. He runs cold water over his hands.  He says he does not have any problems with GERD. Maintained on Protonix.  His last colonoscopy was in 2018.  He says her stools are loose.  He is having a BM x 2 a day. His appetite is good. He has gained from 185 to 192.2lb.   Hx of atrial fib and maintained on Pradaxa.    Review of Systems Past Medical History:  Diagnosis Date  . Arthritis   . Cancer ([place])   . Carpal tunnel syndrome   . Cataract    left eye  . Chronic back pain    multiple back surgeries  . Complication of anesthesia    pt states woke up during hemorroid surgery  . Constipation    Metamucil every mornind  . DVT (deep venous thrombosis) ([place]) 03/2007   left leg   . GERD (gastroesophageal reflux disease)    takes Protonix daily  . Glaucoma   . History of colon polyps   . History of hyperglycemia   . History of MRSA infection 1992  . HTN (hypertension)    takes  Ramipril daily  . Hyperlipidemia    takes Pravastatin 3 times a week  . Joint pain   . Joint swelling   . Nocturia   . Paroxysmal atrial fibrillation (H[facility])    takes Pradaxa daily, Chads2vasc score 2(07/11/14)  . Pneumonia    many yrs ago  . Pulmonary embolism ([place]) 2008  . Urinary frequency   . Urinary urgency     Past Surgical History:  Procedure Laterality Date  . CARDIAC CATHETERIZATION  2008  . CARPAL TUNNEL RELEASE Right   . cataract surgery Right   . cervical and lumbar surgery    . COLONOSCOPY  04/24/2011   Procedure: COLONOSCOPY;  Surgeon: Rogene Houston, MD;  [facility];  Service: Endoscopy;  Laterality: N/A;  1200  . COLONOSCOPY N/A 08/13/2016   Procedure: COLONOSCOPY;  Surgeon: Rogene Houston, MD;  [facility];  Service:  Endoscopy;  Laterality: N/A;  1200  . ESOPHAGOGASTRODUODENOSCOPY  03/10/2012   Procedure: ESOPHAGOGASTRODUODENOSCOPY (EGD);  Surgeon: Rogene Houston, MD;  [facility];  Service: Endoscopy;  Laterality: N/A;  325  . HARDWARE REMOVAL N/A 04/24/2014   Procedure: REMOVAL OF SACRAL INSTRUMENTATION WITH [place];  Surgeon: Kristeen Miss, MD;  Location: [facility];  Service: Neurosurgery;  Laterality: N/A;  . HEMORRHOIDECTOMY WITH HEMORRHOID BANDING    . KNEE ARTHROSCOPY Right   . KNEE SURGERY     left  . LASIK    . POLYPECTOMY  08/13/2016   Procedure: POLYPECTOMY;  Surgeon: Rogene Houston, MD;  [facility];  Service: Endoscopy;;  colon  . PVI  01/24/10   afib ablation    Allergies  Allergen Reactions  . Sectral [Acebutolol Hcl]     Side effects  . Sulfonamide Derivatives Other (See Comments)    Runs blood pressure up  . Zocor [Simvastatin]     Joint pain    Current Outpatient Medications on File Prior to Visit  Medication Sig Dispense Refill  . amLODipine (NORVASC) 5 MG tablet Take 1 tablet (5  mg total) by mouth daily. 90 tablet 1  . dabigatran (PRADAXA) 150 MG CAPS capsule TAKE ONE CAPSULE BY MOUTH EVERY 12 HOURS 180 capsule 2  . diazepam (VALIUM) 5 MG tablet Take 1 tablet (5 mg total) by mouth 2 (two) times daily. 10 tablet 0  . diphenhydrAMINE-APAP, sleep, (TYLENOL PM EXTRA STRENGTH PO) Take by mouth. One at night    . indapamide (LOZOL) 2.5 MG tablet Take 1 tablet (2.5 mg total) by mouth daily. 90 tablet 1  . latanoprost (XALATAN) 0.005 % ophthalmic solution Place 1 drop into both eyes at bedtime.    . Oxycodone HCl 10 MG TABS One 4 times a day prn pain 120 tablet 0  . pantoprazole (PROTONIX) 40 MG tablet Take 1 tablet (40 mg total) by mouth daily. 90 tablet 4  . pravastatin (PRAVACHOL) 80 MG tablet Take 1 tablet (80 mg total) by mouth daily. 90 tablet 1  . ramipril (ALTACE) 10 MG capsule Take 1 capsule (10 mg total) by mouth 2 (two) times daily. 180 capsule 1     No current facility-administered medications on file prior to visit.         Objective:   Physical Exam Blood pressure (!) 162/82, pulse 64, temperature 97.9 F (36.6 C), height 5\' 6"  (1.676 m), weight 192 lb 3.2 oz (87.2 kg). Alert and oriented. Skin warm and dry. Oral mucosa is moist.   . Sclera anicteric, conjunctivae is pink. Thyroid not enlarged. No cervical lymphadenopathy. Lungs clear. Heart regular rate and rhythm.  Abdomen is soft. Bowel sounds are positive. No hepatomegaly. No abdominal masses felt. No tenderness.  No edema to lower extremities.          Assessment & Plan:  GERD well controlled with Protonix.  Continue the Protonix. OV in 1 year.

## 2018-01-03 ENCOUNTER — Encounter: Payer: Self-pay | Admitting: Family Medicine

## 2018-01-03 ENCOUNTER — Ambulatory Visit (INDEPENDENT_AMBULATORY_CARE_PROVIDER_SITE_OTHER): Payer: PPO | Admitting: Family Medicine

## 2018-01-03 VITALS — BP 120/72 | Temp 98.5°F | Ht 66.0 in | Wt 189.1 lb

## 2018-01-03 DIAGNOSIS — R1013 Epigastric pain: Secondary | ICD-10-CM | POA: Diagnosis not present

## 2018-01-03 MED ORDER — SUCRALFATE 1 G PO TABS: 1.0000 g | ORAL_TABLET | Freq: Three times a day (TID) | ORAL | 4 refills | Status: DC

## 2018-01-03 NOTE — Progress Notes (Signed)
   Subjective:    Patient ID: [name], male    DOB: 1940/06/20, 77 y.o.   MRN: [id number]  HPI  Patient is here today with complaints of stomach pain for three weeks. He thinks it is possibly an ulcer.He takes Protonix 40 mg daily. He picked up pecid ac and mylanta he states it has calmed down some with taking those. Significant gastritis symptoms with epigastric pain discomfort intermittent sometimes at night no weight loss with it no change in bowel habits no bleeding took Pepcid and Mylanta is doing much better now it is gone he does take Protonix on a regular basis.  He did see gastroenterology regarding all of this as well Review of Systems  Constitutional: Negative for activity change.  HENT: Negative for congestion and rhinorrhea.   Respiratory: Negative for cough and shortness of breath.   Cardiovascular: Negative for chest pain.  Gastrointestinal: Positive for abdominal pain. Negative for constipation, diarrhea, nausea and vomiting.  Genitourinary: Negative for dysuria and hematuria.  Neurological: Negative for weakness and headaches.  Psychiatric/Behavioral: Negative for behavioral problems and confusion.       Objective:   Physical Exam  Constitutional: He appears well-developed and well-nourished.  HENT:  Head: Normocephalic and atraumatic.  Cardiovascular: Normal rate, regular rhythm and normal heart sounds.  No murmur heard. Pulmonary/Chest: Effort normal and breath sounds normal.  Abdominal: Soft. There is no tenderness. There is no rebound.  Musculoskeletal: He exhibits no edema.  Lymphadenopathy:    He has no cervical adenopathy.  Neurological: He is alert.  Psychiatric: His behavior is normal.  Vitals reviewed.         Assessment & Plan:  Gastritis Use Carafate in place some Mylanta Diarrhea should go away if does not go away with stopping Mylanta then let us know If pain returns he will need further work-up including possible labs and  scan Continue other chronic medicines but stay away from all anti-inflammatories Follow-up for regular pain visits

## 2018-01-31 ENCOUNTER — Encounter: Payer: Self-pay | Admitting: Family Medicine

## 2018-01-31 ENCOUNTER — Ambulatory Visit (INDEPENDENT_AMBULATORY_CARE_PROVIDER_SITE_OTHER): Payer: PPO | Admitting: Family Medicine

## 2018-01-31 ENCOUNTER — Ambulatory Visit (HOSPITAL_COMMUNITY)
Admission: RE | Admit: 2018-01-31 | Discharge: 2018-01-31 | Disposition: A | Discharge status: Home / Self Care | Payer: PPO | Source: Ambulatory Visit | Attending: Family Medicine | Admitting: Family Medicine

## 2018-01-31 VITALS — BP 130/70 | Temp 98.2°F | Ht 66.0 in | Wt 191.0 lb

## 2018-01-31 DIAGNOSIS — M25512 Pain in left shoulder: Secondary | ICD-10-CM | POA: Insufficient documentation

## 2018-01-31 DIAGNOSIS — Z23 Encounter for immunization: Secondary | ICD-10-CM

## 2018-01-31 DIAGNOSIS — S4992XA Unspecified injury of left shoulder and upper arm, initial encounter: Secondary | ICD-10-CM | POA: Diagnosis not present

## 2018-01-31 NOTE — Progress Notes (Signed)
   Subjective:    Patient ID: [name], male    DOB: 1940/09/27, 77 y.o.   MRN: [id number]  HPI Significant pain discomfort in the left shoulder hurts with movement.  Decreased range of motion.  Fell from a truck.  Denies any other trouble. Patient is here today with left shoulder pain. Thinks he tore rotator cuff [place]. He states if it is torn he will not have surgery on it. He states he can move it. He states he has been taking his oxycodone 10 mg. Review of Systems Please see above relates neck pain shoulder pain denies chest tightness pressure pain shortness breath nausea vomiting denies unilateral weakness except for difficulty with moving the shoulder and arm    Objective:   Physical Exam Lungs clear respiratory rate normal heart rate is controlled irregular skin warm dry neurologic grossly normal no unilateral numbness patient does have decreased range of motion of the left shoulder with some tenderness in the left shoulder clavicle nontender       Assessment & Plan:  Shoulder injury X-ray indicated Exercises shown Hold off on any type of surgery Follow-up if progressive troubles Oxycodone as needed pain  Patient also relates intermittent abdominal pain relates black stool but states it does not seem as dark right now he is taking his Protonix on a daily basis but not taking his sucralfate   Rectal exam heme-negative abdomen soft he is to take his pantoprazole on a daily basis avoid all anti-inflammatories if it reoccurs he is let us know you saw Carafate 4 times daily for the next month

## 2018-01-31 NOTE — Patient Instructions (Signed)
Use pantoprazole one every day  Also use Sucralfate tablet one 4 times a day for the next 4 weeks

## 2018-02-16 ENCOUNTER — Encounter: Payer: Self-pay | Admitting: Family Medicine

## 2018-02-16 ENCOUNTER — Ambulatory Visit (INDEPENDENT_AMBULATORY_CARE_PROVIDER_SITE_OTHER): Payer: PPO | Admitting: Family Medicine

## 2018-02-16 VITALS — BP 138/78 | Ht 66.0 in | Wt 193.8 lb

## 2018-02-16 DIAGNOSIS — G8929 Other chronic pain: Secondary | ICD-10-CM | POA: Diagnosis not present

## 2018-02-16 DIAGNOSIS — M25512 Pain in left shoulder: Secondary | ICD-10-CM | POA: Diagnosis not present

## 2018-02-16 DIAGNOSIS — M549 Dorsalgia, unspecified: Secondary | ICD-10-CM

## 2018-02-16 DIAGNOSIS — M25542 Pain in joints of left hand: Secondary | ICD-10-CM | POA: Diagnosis not present

## 2018-02-16 DIAGNOSIS — K921 Melena: Secondary | ICD-10-CM | POA: Diagnosis not present

## 2018-02-16 MED ORDER — OXYCODONE HCL 10 MG PO TABS: ORAL_TABLET | ORAL | 0 refills | Status: DC

## 2018-02-16 NOTE — Progress Notes (Signed)
   Subjective:    Patient ID: [name], male    DOB: Oct 17, 1940, 77 y.o.   MRN: [id number]  HPI This patient was seen today for chronic pain  The medication list was reviewed and updated.   -Compliance with medication: yes  - Number patient states they take daily: 2-3  -when was the last dose patient took? 7:30 this morning  The patient was advised the importance of maintaining medication and not using illegal substances with these.  Here for refills and follow up  The patient was educated that we can provide 3 monthly scripts for their medication, it is their responsibility to follow the instructions.  Side effects or complications from medications: none  Patient is aware that pain medications are meant to minimize the severity of the pain to allow their pain levels to improve to allow for better function. They are aware of that pain medications cannot totally remove their pain.  Due for UDT ( at least once per year) : 08/13/17  Patient relates arthralgias in the hands relates worse in the left hand relates left shoulder pain discomfort stiffness  Patient takes pain medicine for his back he does not abuse it follows directions   Review of Systems  Constitutional: Negative for activity change.  HENT: Negative for congestion and rhinorrhea.   Respiratory: Negative for cough and shortness of breath.   Cardiovascular: Negative for chest pain.  Gastrointestinal: Negative for abdominal pain, diarrhea, nausea and vomiting.  Genitourinary: Negative for dysuria and hematuria.  Neurological: Negative for weakness and headaches.  Psychiatric/Behavioral: Negative for behavioral problems and confusion.       Objective:   Physical Exam  Constitutional: He appears well-nourished.  Cardiovascular: Normal rate, regular rhythm and normal heart sounds.  No murmur heard. Pulmonary/Chest: Effort normal and breath sounds normal.  Musculoskeletal: He exhibits no edema.    Lymphadenopathy:    He has no cervical adenopathy.  Neurological: He is alert.  Psychiatric: His behavior is normal.  Vitals reviewed.  Decreased range of motion in the left shoulder probable rotator cuff involvement       Assessment & Plan:  Recommend referral to orthopedics/rheumatology for further evaluation [facility] Patient on blood thinner cannot be on NSAIDs Patient thinks he had blood in his stool back in August we will check a stool test to make sure he is not having ongoing bleeding  The patient was seen in followup for chronic pain. A review over at their current pain status was discussed. Drug registry was checked. Prescriptions were given. Discussion was held regarding the importance of compliance with medication as well as pain medication contract.  Time for questions regarding pain management plan occurred. Importance of regular followup visits was discussed. Patient was informed that medication may cause drowsiness and should not be combined  with other medications/alcohol or street drugs. Patient was cautioned that medication could cause drowsiness. If the patient feels medication is causing altered alertness then do not drive or operate dangerous equipment.

## 2018-02-21 ENCOUNTER — Ambulatory Visit (INDEPENDENT_AMBULATORY_CARE_PROVIDER_SITE_OTHER): Payer: PPO | Admitting: Family Medicine

## 2018-02-21 ENCOUNTER — Ambulatory Visit (HOSPITAL_COMMUNITY)
Admission: RE | Admit: 2018-02-21 | Discharge: 2018-02-21 | Disposition: A | Discharge status: Home / Self Care | Payer: PPO | Source: Ambulatory Visit | Attending: Family Medicine | Admitting: Family Medicine

## 2018-02-21 ENCOUNTER — Encounter: Payer: Self-pay | Admitting: Family Medicine

## 2018-02-21 VITALS — BP 130/76 | Ht 66.0 in | Wt 194.0 lb

## 2018-02-21 DIAGNOSIS — S8002XA Contusion of left knee, initial encounter: Secondary | ICD-10-CM | POA: Diagnosis not present

## 2018-02-21 DIAGNOSIS — S82102A Unspecified fracture of upper end of left tibia, initial encounter for closed fracture: Secondary | ICD-10-CM | POA: Diagnosis not present

## 2018-02-21 DIAGNOSIS — M79605 Pain in left leg: Secondary | ICD-10-CM

## 2018-02-21 DIAGNOSIS — M25462 Effusion, left knee: Secondary | ICD-10-CM | POA: Insufficient documentation

## 2018-02-21 DIAGNOSIS — S82142A Displaced bicondylar fracture of left tibia, initial encounter for closed fracture: Secondary | ICD-10-CM | POA: Diagnosis not present

## 2018-02-21 DIAGNOSIS — W11XXXA Fall on and from ladder, initial encounter: Secondary | ICD-10-CM | POA: Diagnosis not present

## 2018-02-21 DIAGNOSIS — M25562 Pain in left knee: Secondary | ICD-10-CM | POA: Diagnosis not present

## 2018-02-21 NOTE — Progress Notes (Signed)
   Subjective:    Patient ID: [name], male    DOB: 1940/07/23, 77 y.o.   MRN: [id number]  HPIFell off of a ladder Oct 4th and hurt left knee. Takes oxycodone and tylenol for pain.   Patient fell sustaining a significant injury to his left knee because swelling and pain and discomfort took Tylenol oxycodone he is on blood thinners he denies any other injury.    Review of Systems  Constitutional: Negative for activity change.  HENT: Negative for congestion and rhinorrhea.   Respiratory: Negative for cough and shortness of breath.   Cardiovascular: Negative for chest pain.  Gastrointestinal: Negative for abdominal pain, diarrhea, nausea and vomiting.  Genitourinary: Negative for dysuria and hematuria.  Neurological: Negative for weakness and headaches.  Psychiatric/Behavioral: Negative for behavioral problems and confusion.       Objective:   Physical Exam  Constitutional: He appears well-nourished. No distress.  HENT:  Head: Normocephalic and atraumatic.  Eyes: Right eye exhibits no discharge. Left eye exhibits no discharge.  Neck: No tracheal deviation present.  Cardiovascular: Normal rate, regular rhythm and normal heart sounds.  No murmur heard. Pulmonary/Chest: Effort normal and breath sounds normal. No respiratory distress.  Musculoskeletal: He exhibits no edema.  Lymphadenopathy:    He has no cervical adenopathy.  Neurological: He is alert. Coordination normal.  Skin: Skin is warm and dry.  Psychiatric: He has a normal mood and affect. His behavior is normal.  Vitals reviewed.  Left knee swollen tender painful with movement       Assessment & Plan:  X-ray left knee shows a mild depression fracture of the tibia needs referral to orthopedics Patient was encouraged to stay off of it as much as possible until seen by orthopedics He is to stay off of ladders

## 2018-02-22 NOTE — Progress Notes (Signed)
Referral placed and pt is aware someone will be in contact with him to set up an appt.

## 2018-02-22 NOTE — Addendum Note (Signed)
Addended by: Karle Barr on: 02/22/2018 08:54 AM   Modules accepted: Orders

## 2018-02-23 ENCOUNTER — Ambulatory Visit (INDEPENDENT_AMBULATORY_CARE_PROVIDER_SITE_OTHER): Payer: PPO | Admitting: Orthopaedic Surgery

## 2018-02-23 ENCOUNTER — Encounter (INDEPENDENT_AMBULATORY_CARE_PROVIDER_SITE_OTHER): Payer: Self-pay | Admitting: Orthopaedic Surgery

## 2018-02-23 DIAGNOSIS — M25512 Pain in left shoulder: Secondary | ICD-10-CM

## 2018-02-23 DIAGNOSIS — G8929 Other chronic pain: Secondary | ICD-10-CM | POA: Diagnosis not present

## 2018-02-23 DIAGNOSIS — S82122A Displaced fracture of lateral condyle of left tibia, initial encounter for closed fracture: Secondary | ICD-10-CM | POA: Diagnosis not present

## 2018-02-23 MED ORDER — METHYLPREDNISOLONE ACETATE 40 MG/ML IJ SUSP
40.0000 mg | INTRAMUSCULAR | Status: AC | PRN
  Administered 2018-02-23: 40 mg via INTRA_ARTICULAR

## 2018-02-23 MED ORDER — LIDOCAINE HCL 1 % IJ SOLN
3.0000 mL | INTRAMUSCULAR | Status: AC | PRN
  Administered 2018-02-23: 3 mL

## 2018-02-23 MED ORDER — BUPIVACAINE HCL 0.5 % IJ SOLN
3.0000 mL | INTRAMUSCULAR | Status: AC | PRN
  Administered 2018-02-23: 3 mL via INTRA_ARTICULAR

## 2018-02-23 NOTE — Progress Notes (Signed)
Office Visit Note   Patient: [name]           Date of Birth: [birth year]           MRN: [id number] Visit Date: 02/23/2018              Requested by: Kathyrn Drown, MD [address] [place], [place] [address] PCP: Kathyrn Drown, MD   Assessment & Plan: Visit Diagnoses:  1. Closed fracture of lateral portion of left tibial plateau, initial encounter   2. Chronic left shoulder pain     Plan: Impression is #1 minimally depressed left tibial plateau fracture.  This should be amenable to nonoperative treatment.  I want him to offload the leg with a walker for a month.  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  In terms of left shoulder this seems to be bothering him quite a bit he has been doing home exercises.  I recommend a cortisone injection which we performed today.  Patient tolerates well.  Recheck in 2 weeks to see how he does from the shot and 2 view x-rays of left knee.  Follow-Up Instructions: Return in about 2 weeks (around 03/09/2018).   Orders:  Orders Placed This Encounter  Procedures  . Large Joint Inj: L subacromial bursa   No orders of the defined types were placed in this encounter.     Procedures: Large Joint Inj: L subacromial bursa on 02/23/2018 4:02 PM Indications: pain Details: 22 G needle  Arthrogram: No  Medications: 3 mL bupivacaine 0.5 %; 3 mL lidocaine 1 %; 40 mg methylPREDNISolone acetate 40 MG/ML Outcome: tolerated well, no immediate complications Patient was prepped and draped in the usual sterile fashion.       Clinical Data: No additional findings.   Subjective: Chief Complaint  Patient presents with  . Left Knee - Pain    [name] is a 77 year old gentleman who comes in with acute lateral tibial plateau fracture that he sustained last Friday from falling off of a ladder.  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  He is overall feeling better.  He is also complaining of left shoulder pain that has gotten  worse recently from a fall.  He originally was unable to raise his arm but now he is able to.  Denies any numbness and tingling.   Review of Systems  Constitutional: Negative.   All other systems reviewed and are negative.    Objective: Vital Signs: There were no vitals taken for this visit.  Physical Exam  Constitutional: He is oriented to person, place, and time. He appears well-developed and well-nourished.  HENT:  Head: Normocephalic and atraumatic.  Eyes: Pupils are equal, round, and reactive to light.  Neck: Neck supple.  Pulmonary/Chest: Effort normal.  Abdominal: Soft.  Musculoskeletal: Normal range of motion.  Neurological: He is alert and oriented to person, place, and time.  Skin: Skin is warm.  Psychiatric: He has a normal mood and affect. His behavior is normal. Judgment and thought content normal.  Nursing note and vitals reviewed.   Ortho Exam Left knee exam shows a small joint effusion.  Collaterals and cruciates are stable.  Lateral tibial plateau is mildly tender.  No malalignments of the leg.  Left shoulder exam shows mild weakness and discomfort with rotator cuff testing.  Mildly positive impingement signs. Specialty Comments:  No specialty comments available.  Imaging: No results found.   PMFS History: Patient Active Problem  List   Diagnosis Date Noted  . Aortic atherosclerosis ([place]) 11/05/2016  . History of colonic polyps 06/16/2016  . Pseudoarthrosis of lumbar spine 04/24/2014  . Hx of adenomatous colonic polyps 11/22/2013  . Prediabetes 10/12/2013  . Fatigue 12/21/2012  . Snoring 12/21/2012  . Chronic back pain greater than 3 months duration 10/31/2012  . Sinusitis 02/22/2012  . GERD (gastroesophageal reflux disease) 02/22/2012  . Hyperlipidemia 01/21/2012  . HYPERTENSION, BENIGN 12/06/2009  . ATRIAL FIBRILLATION, PAROXYSMAL 12/06/2009   Past Medical History:  Diagnosis Date  . Arthritis   . Cancer ([place])   . Carpal tunnel syndrome     . Cataract    left eye  . Chronic back pain    multiple back surgeries  . Complication of anesthesia    pt states woke up during hemorroid surgery  . Constipation    Metamucil every mornind  . DVT (deep venous thrombosis) ([place]) 03/2007   left leg   . GERD (gastroesophageal reflux disease)    takes Protonix daily  . Glaucoma   . History of colon polyps   . History of hyperglycemia   . History of MRSA infection 1992  . HTN (hypertension)    takes  Ramipril daily  . Hyperlipidemia    takes Pravastatin 3 times a week  . Joint pain   . Joint swelling   . Nocturia   . Paroxysmal atrial fibrillation (HCC)    takes Pradaxa daily, Chads2vasc score 2(07/11/14)  . Pneumonia    many yrs ago  . Pulmonary embolism ([place]) 2008  . Urinary frequency   . Urinary urgency     Family History  Problem Relation Age of Onset  . Hypertension Mother   . Transient ischemic attack Mother   . Heart disease Father   . Diabetes Brother   . Healthy Daughter   . Colon cancer Sister   . Hypertension Unknown   . Heart disease Unknown     Past Surgical History:  Procedure Laterality Date  . CARDIAC CATHETERIZATION  2008  . CARPAL TUNNEL RELEASE Right   . cataract surgery Right   . cervical and lumbar surgery    . COLONOSCOPY  04/24/2011   Procedure: COLONOSCOPY;  Surgeon: Rogene Houston, MD;  [facility];  Service: Endoscopy;  Laterality: N/A;  1200  . COLONOSCOPY N/A 08/13/2016   Procedure: COLONOSCOPY;  Surgeon: Rogene Houston, MD;  [facility];  Service: Endoscopy;  Laterality: N/A;  1200  . ESOPHAGOGASTRODUODENOSCOPY  03/10/2012   Procedure: ESOPHAGOGASTRODUODENOSCOPY (EGD);  Surgeon: Rogene Houston, MD;  [facility];  Service: Endoscopy;  Laterality: N/A;  325  . HARDWARE REMOVAL N/A 04/24/2014   Procedure: REMOVAL OF SACRAL INSTRUMENTATION WITH [place];  Surgeon: Kristeen Miss, MD;  Location: [facility];  Service: Neurosurgery;  Laterality: N/A;  .  HEMORRHOIDECTOMY WITH HEMORRHOID BANDING    . KNEE ARTHROSCOPY Right   . KNEE SURGERY     left  . LASIK    . POLYPECTOMY  08/13/2016   Procedure: POLYPECTOMY;  Surgeon: Rogene Houston, MD;  [facility];  Service: Endoscopy;;  colon  . PVI  01/24/10   afib ablation   Social History   Occupational History  . Occupation: retired  Tobacco Use  . Smoking status: Never Smoker  . Smokeless tobacco: Never Used  Substance and Sexual Activity  . Alcohol use: Yes    Comment: wine nightly  . Drug use: No  . Sexual  activity: Yes

## 2018-03-09 ENCOUNTER — Ambulatory Visit (INDEPENDENT_AMBULATORY_CARE_PROVIDER_SITE_OTHER): Payer: PPO

## 2018-03-09 ENCOUNTER — Encounter (INDEPENDENT_AMBULATORY_CARE_PROVIDER_SITE_OTHER): Payer: Self-pay | Admitting: Orthopaedic Surgery

## 2018-03-09 ENCOUNTER — Ambulatory Visit (INDEPENDENT_AMBULATORY_CARE_PROVIDER_SITE_OTHER): Payer: PPO | Admitting: Orthopaedic Surgery

## 2018-03-09 DIAGNOSIS — S82122A Displaced fracture of lateral condyle of left tibia, initial encounter for closed fracture: Secondary | ICD-10-CM

## 2018-03-09 NOTE — Progress Notes (Signed)
Office Visit Note   Patient: [name]           Date of Birth: 1940/09/12           MRN: [id number] Visit Date: 03/09/2018              Requested by: Kathyrn Drown, MD [address] [place], [place] [address] PCP: Kathyrn Drown, MD   Assessment & Plan: Visit Diagnoses:  1. Closed fracture of lateral portion of left tibial plateau, initial encounter     Plan: Impression is left knee depressed lateral tibial plateau fracture.  At this point, we will have the patient continue using a cane/walker for the next 2 weeks to protect his weightbearing.  He will follow-up with [place] at that point for repeat evaluation and x-ray.  Follow-Up Instructions: Return in about 2 weeks (around 03/23/2018).   Orders:  Orders Placed This Encounter  Procedures  . XR KNEE 3 VIEW LEFT   No orders of the defined types were placed in this encounter.     Procedures: No procedures performed   Clinical Data: No additional findings.   Subjective: Chief Complaint  Patient presents with  . Left Knee - Pain, Follow-up  . Left Shoulder - Pain, Follow-up    HPI patient is a pleasant 77 year old gentleman who presents to our clinic today approximately 2-1/2 weeks out depressed fracture lateral tibial plateau left knee, date of injury 02/18/2018.  He has been doing well.  His pain has markedly improved.  He has been using a cane with ambulation.  Overall, feeling much better.  He did have a subacromial cortisone injection to the left shoulder 2 weeks ago which has significantly improved his pain there.  Review of Systems as detailed in HPI.  All others reviewed and are negative.   Objective: Vital Signs: There were no vitals taken for this visit.  Physical Exam well-developed well-nourished gentleman no acute distress.  Alert and oriented x3.  Ortho Exam examination of the left knee reveals range of motion from 0 to 110 degrees.  Mild to moderate tenderness lateral joint line.   Neurovascularly intact distally.  Specialty Comments:  No specialty comments available.  Imaging: Xr Knee 3 View Left  Result Date: 03/09/2018 X-rays of the left knee demonstrate a stable depressed lateral tibial plateau fracture with healing    PMFS History: Patient Active Problem List   Diagnosis Date Noted  . Closed fracture of lateral portion of left tibial plateau 03/09/2018  . Aortic atherosclerosis ([place]) 11/05/2016  . History of colonic polyps 06/16/2016  . Pseudoarthrosis of lumbar spine 04/24/2014  . Hx of adenomatous colonic polyps 11/22/2013  . Prediabetes 10/12/2013  . Fatigue 12/21/2012  . Snoring 12/21/2012  . Chronic back pain greater than 3 months duration 10/31/2012  . Sinusitis 02/22/2012  . GERD (gastroesophageal reflux disease) 02/22/2012  . Hyperlipidemia 01/21/2012  . HYPERTENSION, BENIGN 12/06/2009  . ATRIAL FIBRILLATION, PAROXYSMAL 12/06/2009   Past Medical History:  Diagnosis Date  . Arthritis   . Cancer ([place])   . Carpal tunnel syndrome   . Cataract    left eye  . Chronic back pain    multiple back surgeries  . Complication of anesthesia    pt states woke up during hemorroid surgery  . Constipation    Metamucil every mornind  . DVT (deep venous thrombosis) ([place]) 03/2007   left leg   . GERD (gastroesophageal reflux disease)    takes Protonix daily  .  Glaucoma   . History of colon polyps   . History of hyperglycemia   . History of MRSA infection 1992  . HTN (hypertension)    takes  Ramipril daily  . Hyperlipidemia    takes Pravastatin 3 times a week  . Joint pain   . Joint swelling   . Nocturia   . Paroxysmal atrial fibrillation (HCC)    takes Pradaxa daily, Chads2vasc score 2(07/11/14)  . Pneumonia    many yrs ago  . Pulmonary embolism ([place]) 2008  . Urinary frequency   . Urinary urgency     Family History  Problem Relation Age of Onset  . Hypertension Mother   . Transient ischemic attack Mother   . Heart disease Father   .  Diabetes Brother   . Healthy Daughter   . Colon cancer Sister   . Hypertension Unknown   . Heart disease Unknown     Past Surgical History:  Procedure Laterality Date  . CARDIAC CATHETERIZATION  2008  . CARPAL TUNNEL RELEASE Right   . cataract surgery Right   . cervical and lumbar surgery    . COLONOSCOPY  04/24/2011   Procedure: COLONOSCOPY;  Surgeon: Rogene Houston, MD;  [facility];  Service: Endoscopy;  Laterality: N/A;  1200  . COLONOSCOPY N/A 08/13/2016   Procedure: COLONOSCOPY;  Surgeon: Rogene Houston, MD;  [facility];  Service: Endoscopy;  Laterality: N/A;  1200  . ESOPHAGOGASTRODUODENOSCOPY  03/10/2012   Procedure: ESOPHAGOGASTRODUODENOSCOPY (EGD);  Surgeon: Rogene Houston, MD;  [facility];  Service: Endoscopy;  Laterality: N/A;  325  . HARDWARE REMOVAL N/A 04/24/2014   Procedure: REMOVAL OF SACRAL INSTRUMENTATION WITH [place];  Surgeon: Kristeen Miss, MD;  Location: [facility];  Service: Neurosurgery;  Laterality: N/A;  . HEMORRHOIDECTOMY WITH HEMORRHOID BANDING    . KNEE ARTHROSCOPY Right   . KNEE SURGERY     left  . LASIK    . POLYPECTOMY  08/13/2016   Procedure: POLYPECTOMY;  Surgeon: Rogene Houston, MD;  [facility];  Service: Endoscopy;;  colon  . PVI  01/24/10   afib ablation   Social History   Occupational History  . Occupation: retired  Tobacco Use  . Smoking status: Never Smoker  . Smokeless tobacco: Never Used  Substance and Sexual Activity  . Alcohol use: Yes    Comment: wine nightly  . Drug use: No  . Sexual activity: Yes

## 2018-03-18 ENCOUNTER — Other Ambulatory Visit (INDEPENDENT_AMBULATORY_CARE_PROVIDER_SITE_OTHER): Payer: PPO

## 2018-03-18 DIAGNOSIS — M25542 Pain in joints of left hand: Secondary | ICD-10-CM | POA: Diagnosis not present

## 2018-03-18 DIAGNOSIS — K921 Melena: Secondary | ICD-10-CM

## 2018-03-18 LAB — IFOBT (OCCULT BLOOD): IFOBT: POSITIVE

## 2018-03-21 ENCOUNTER — Encounter: Payer: Self-pay | Admitting: Family Medicine

## 2018-03-23 ENCOUNTER — Ambulatory Visit (INDEPENDENT_AMBULATORY_CARE_PROVIDER_SITE_OTHER): Payer: PPO | Admitting: Orthopaedic Surgery

## 2018-03-23 ENCOUNTER — Encounter (INDEPENDENT_AMBULATORY_CARE_PROVIDER_SITE_OTHER): Payer: Self-pay | Admitting: Orthopaedic Surgery

## 2018-03-23 ENCOUNTER — Ambulatory Visit (INDEPENDENT_AMBULATORY_CARE_PROVIDER_SITE_OTHER): Payer: PPO

## 2018-03-23 DIAGNOSIS — S82122A Displaced fracture of lateral condyle of left tibia, initial encounter for closed fracture: Secondary | ICD-10-CM

## 2018-03-23 NOTE — Progress Notes (Signed)
Office Visit Note   Patient: [name]           Date of Birth: 26-Jan-1941           MRN: [id number] Visit Date: 03/23/2018              Requested by: Kathyrn Drown, MD [address] [place], [place] [address] PCP: Kathyrn Drown, MD   Assessment & Plan: Visit Diagnoses:  1. Closed fracture of lateral portion of left tibial plateau, initial encounter     Plan: Impression is 5 weeks status post slightly depressed lateral tibial plateau fracture, left knee.  Patient is doing well.  I have encouraged him to start physical therapy but he has politely declined.  He will continue to use his cane as needed but cannot fully bear weight.  He will work on range of motion exercises and strengthening exercises at home.  He will follow-up with [place] in 4 weeks time for repeat evaluation and x-ray.  Follow-Up Instructions: Return in about 4 weeks (around 04/20/2018).   Orders:  Orders Placed This Encounter  Procedures  . XR Knee 1-2 Views Left   No orders of the defined types were placed in this encounter.     Procedures: No procedures performed   Clinical Data: No additional findings.   Subjective: Chief Complaint  Patient presents with  . Left Knee - Pain    HPI patient is a pleasant 77 year old gentleman who presents to our clinic today approximately 5 weeks out slightly depressed lateral tibial plateau fracture, date of injury 02-18-18.  He has been doing well.  He occasionally has a sharp shooting pain when he pivots.  For this reason, he has been using his cane.  Otherwise no complaints.  He has been trying to regain strength going up and down the stairs at home.  Review of Systems as detailed in HPI.  All others reviewed and are negative.   Objective: Vital Signs: There were no vitals taken for this visit.  Physical Exam well-developed well-nourished gentleman in no acute distress.  Alert and oriented x3.  Ortho Exam examination of the left knee shows range  of motion from 0 to 120 degrees.  No joint line tenderness.  4 out of 5 strength with resisted straight leg raise.  Is neurovascular intact distally.  Specialty Comments:  No specialty comments available.  Imaging: Xr Knee 1-2 Views Left  Result Date: 03/23/2018 X-rays show stable alignment of the fracture with good callus formation    PMFS History: Patient Active Problem List   Diagnosis Date Noted  . Closed fracture of lateral portion of left tibial plateau 03/09/2018  . Aortic atherosclerosis ([place]) 11/05/2016  . History of colonic polyps 06/16/2016  . Pseudoarthrosis of lumbar spine 04/24/2014  . Hx of adenomatous colonic polyps 11/22/2013  . Prediabetes 10/12/2013  . Fatigue 12/21/2012  . Snoring 12/21/2012  . Chronic back pain greater than 3 months duration 10/31/2012  . Sinusitis 02/22/2012  . GERD (gastroesophageal reflux disease) 02/22/2012  . Hyperlipidemia 01/21/2012  . HYPERTENSION, BENIGN 12/06/2009  . ATRIAL FIBRILLATION, PAROXYSMAL 12/06/2009   Past Medical History:  Diagnosis Date  . Arthritis   . Cancer ([place])   . Carpal tunnel syndrome   . Cataract    left eye  . Chronic back pain    multiple back surgeries  . Complication of anesthesia    pt states woke up during hemorroid surgery  . Constipation    Metamucil every  mornind  . DVT (deep venous thrombosis) ([place]) 03/2007   left leg   . GERD (gastroesophageal reflux disease)    takes Protonix daily  . Glaucoma   . History of colon polyps   . History of hyperglycemia   . History of MRSA infection 1992  . HTN (hypertension)    takes  Ramipril daily  . Hyperlipidemia    takes Pravastatin 3 times a week  . Joint pain   . Joint swelling   . Nocturia   . Paroxysmal atrial fibrillation (HCC)    takes Pradaxa daily, Chads2vasc score 2(07/11/14)  . Pneumonia    many yrs ago  . Pulmonary embolism ([place]) 2008  . Urinary frequency   . Urinary urgency     Family History  Problem Relation Age of Onset    . Hypertension Mother   . Transient ischemic attack Mother   . Heart disease Father   . Diabetes Brother   . Healthy Daughter   . Colon cancer Sister   . Hypertension Unknown   . Heart disease Unknown     Past Surgical History:  Procedure Laterality Date  . CARDIAC CATHETERIZATION  2008  . CARPAL TUNNEL RELEASE Right   . cataract surgery Right   . cervical and lumbar surgery    . COLONOSCOPY  04/24/2011   Procedure: COLONOSCOPY;  Surgeon: Rogene Houston, MD;  [facility];  Service: Endoscopy;  Laterality: N/A;  1200  . COLONOSCOPY N/A 08/13/2016   Procedure: COLONOSCOPY;  Surgeon: Rogene Houston, MD;  [facility];  Service: Endoscopy;  Laterality: N/A;  1200  . ESOPHAGOGASTRODUODENOSCOPY  03/10/2012   Procedure: ESOPHAGOGASTRODUODENOSCOPY (EGD);  Surgeon: Rogene Houston, MD;  [facility];  Service: Endoscopy;  Laterality: N/A;  325  . HARDWARE REMOVAL N/A 04/24/2014   Procedure: REMOVAL OF SACRAL INSTRUMENTATION WITH [place];  Surgeon: Kristeen Miss, MD;  Location: [facility];  Service: Neurosurgery;  Laterality: N/A;  . HEMORRHOIDECTOMY WITH HEMORRHOID BANDING    . KNEE ARTHROSCOPY Right   . KNEE SURGERY     left  . LASIK    . POLYPECTOMY  08/13/2016   Procedure: POLYPECTOMY;  Surgeon: Rogene Houston, MD;  [facility];  Service: Endoscopy;;  colon  . PVI  01/24/10   afib ablation   Social History   Occupational History  . Occupation: retired  Tobacco Use  . Smoking status: Never Smoker  . Smokeless tobacco: Never Used  Substance and Sexual Activity  . Alcohol use: Yes    Comment: wine nightly  . Drug use: No  . Sexual activity: Yes

## 2018-03-28 ENCOUNTER — Ambulatory Visit (INDEPENDENT_AMBULATORY_CARE_PROVIDER_SITE_OTHER): Payer: PPO | Admitting: Internal Medicine

## 2018-03-28 ENCOUNTER — Encounter (INDEPENDENT_AMBULATORY_CARE_PROVIDER_SITE_OTHER): Payer: Self-pay | Admitting: *Deleted

## 2018-03-28 ENCOUNTER — Other Ambulatory Visit (INDEPENDENT_AMBULATORY_CARE_PROVIDER_SITE_OTHER): Payer: Self-pay | Admitting: Internal Medicine

## 2018-03-28 ENCOUNTER — Encounter (INDEPENDENT_AMBULATORY_CARE_PROVIDER_SITE_OTHER): Payer: Self-pay | Admitting: Internal Medicine

## 2018-03-28 VITALS — BP 170/80 | HR 64 | Temp 98.1°F | Ht 66.0 in | Wt 188.0 lb

## 2018-03-28 DIAGNOSIS — K921 Melena: Secondary | ICD-10-CM | POA: Insufficient documentation

## 2018-03-28 LAB — CBC
HCT: 41.7 % (ref 38.5–50.0)
Hemoglobin: 14.3 g/dL (ref 13.2–17.1)
MCH: 31.5 pg (ref 27.0–33.0)
MCHC: 34.3 g/dL (ref 32.0–36.0)
MCV: 91.9 fL (ref 80.0–100.0)
MPV: 9.7 fL (ref 7.5–12.5)
Platelets: 299 10*3/uL (ref 140–400)
RBC: 4.54 10*6/uL (ref 4.20–5.80)
RDW: 12.6 % (ref 11.0–15.0)
WBC: 6.4 10*3/uL (ref 3.8–10.8)

## 2018-03-28 NOTE — Patient Instructions (Signed)
EGD with propofol. The risks of bleeding, perforation and infection were reviewed with patient.

## 2018-03-28 NOTE — Progress Notes (Signed)
Subjective:    Patient ID: [name], male    DOB: 1941-05-16, 77 y.o.   MRN: [id number]  HPI Presents today with c/o dark stools.Last seen in July of this year. Hx of chronic GERD and maintained on Protonix. States he has had black stools 4 weeks ago. Black stools x 1 week. He says he stopped the Pradaxa x 5 days and is now back on.  Today he says his stools are back to normal. Stools are brown in color. Saw Dr. Wolfgang Phoenix and had a fecal occult blood test which was positive.  His last colonoscopy was in March of 2018 with polyps. All polyps were tubular adenomas.  GERD controlled with Protonix. Takes Pepcid at night. Appetite is good. Wt loss of 4 pounds since his last OV  Hx of atrial fib. Maintained on  Pradaxa   Review of Systems Past Medical History:  Diagnosis Date  . Arthritis   . Cancer ([place])   . Carpal tunnel syndrome   . Cataract    left eye  . Chronic back pain    multiple back surgeries  . Complication of anesthesia    pt states woke up during hemorroid surgery  . Constipation    Metamucil every mornind  . DVT (deep venous thrombosis) ([place]) 03/2007   left leg   . GERD (gastroesophageal reflux disease)    takes Protonix daily  . Glaucoma   . History of colon polyps   . History of hyperglycemia   . History of MRSA infection 1992  . HTN (hypertension)    takes  Ramipril daily  . Hyperlipidemia    takes Pravastatin 3 times a week  . Joint pain   . Joint swelling   . Nocturia   . Paroxysmal atrial fibrillation ([facility])    takes Pradaxa daily, Chads2vasc score 2(07/11/14)  . Pneumonia    many yrs ago  . Pulmonary embolism ([place]) 2008  . Urinary frequency   . Urinary urgency     Past Surgical History:  Procedure Laterality Date  . CARDIAC CATHETERIZATION  2008  . CARPAL TUNNEL RELEASE Right   . cataract surgery Right   . cervical and lumbar surgery    . COLONOSCOPY  04/24/2011   Procedure: COLONOSCOPY;  Surgeon: Rogene Houston, MD;  [facility];   Service: Endoscopy;  Laterality: N/A;  1200  . COLONOSCOPY N/A 08/13/2016   Procedure: COLONOSCOPY;  Surgeon: Rogene Houston, MD;  Location: [facility];  Service: Endoscopy;  Laterality: N/A;  1200  . ESOPHAGOGASTRODUODENOSCOPY  03/10/2012   Procedure: ESOPHAGOGASTRODUODENOSCOPY (EGD);  Surgeon: Rogene Houston, MD;  [facility];  Service: Endoscopy;  Laterality: N/A;  325  . HARDWARE REMOVAL N/A 04/24/2014   Procedure: REMOVAL OF SACRAL INSTRUMENTATION WITH [place];  Surgeon: Kristeen Miss, MD;  Location: [facility];  Service: Neurosurgery;  Laterality: N/A;  . HEMORRHOIDECTOMY WITH HEMORRHOID BANDING    . KNEE ARTHROSCOPY Right   . KNEE SURGERY     left  . LASIK    . POLYPECTOMY  08/13/2016   Procedure: POLYPECTOMY;  Surgeon: Rogene Houston, MD;  [facility]: [facility];  Service: Endoscopy;;  colon  . PVI  01/24/10   afib ablation    Allergies  Allergen Reactions  . Sectral [Acebutolol Hcl]     Side effects  . Sulfonamide Derivatives Other (See Comments)    Runs blood pressure up  . Zocor [Simvastatin]     Joint pain  Current Outpatient Medications on File Prior to Visit  Medication Sig Dispense Refill  . amLODipine (NORVASC) 5 MG tablet Take 1 tablet (5 mg total) by mouth daily. 90 tablet 1  . dabigatran (PRADAXA) 150 MG CAPS capsule TAKE ONE CAPSULE BY MOUTH EVERY 12 HOURS 180 capsule 2  . diazepam (VALIUM) 5 MG tablet Take 1 tablet (5 mg total) by mouth 2 (two) times daily. 10 tablet 0  . diphenhydrAMINE-APAP, sleep, (TYLENOL PM EXTRA STRENGTH PO) Take by mouth. One at night    . indapamide (LOZOL) 2.5 MG tablet Take 1 tablet (2.5 mg total) by mouth daily. 90 tablet 1  . latanoprost (XALATAN) 0.005 % ophthalmic solution Place 1 drop into both eyes at bedtime.    . Oxycodone HCl 10 MG TABS One 4 times a day prn pain 120 tablet 0  . Oxycodone HCl 10 MG TABS One qid prn pain 120 tablet 0  . Oxycodone HCl 10 MG TABS One qid prn pain 120 tablet 0  . pantoprazole  (PROTONIX) 40 MG tablet Take 1 tablet (40 mg total) by mouth daily. 90 tablet 4  . pravastatin (PRAVACHOL) 80 MG tablet Take 1 tablet (80 mg total) by mouth daily. 90 tablet 1  . ramipril (ALTACE) 10 MG capsule Take 1 capsule (10 mg total) by mouth 2 (two) times daily. 180 capsule 1   No current facility-administered medications on file prior to visit.         Objective:   Physical Exam Blood pressure (!) 170/80, pulse 64, temperature 98.1 F (36.7 C), height 5\' 6"  (1.676 m), weight 188 lb (85.3 kg). Alert and oriented. Skin warm and dry. Oral mucosa is moist.   . Sclera anicteric, conjunctivae is pink. Thyroid not enlarged. No cervical lymphadenopathy. Lungs clear. Heart regular rate and rhythm.  Abdomen is soft. Bowel sounds are positive. No hepatomegaly. No abdominal masses felt. No tenderness.  No edema to lower extremities.  Rectal exam:Stool brown and guaiac negative.       Assessment & Plan:  Dark stools.? Melena.Will set up for an EGD.Stool brown and guaiac negative.Marland Kitchen Hx of atrial fib. PUD needs to be ruled out.

## 2018-03-29 ENCOUNTER — Telehealth (INDEPENDENT_AMBULATORY_CARE_PROVIDER_SITE_OTHER): Payer: Self-pay | Admitting: *Deleted

## 2018-03-29 NOTE — Telephone Encounter (Signed)
Per Ezekiel Ina, NP - it is ok for patient to stp Pradaxa 2 days prior to procedure -- patient aware

## 2018-03-30 ENCOUNTER — Other Ambulatory Visit: Payer: Self-pay

## 2018-03-30 ENCOUNTER — Encounter (HOSPITAL_COMMUNITY)
Admission: RE | Admit: 2018-03-30 | Discharge: 2018-03-30 | Disposition: A | Discharge status: Home / Self Care | Payer: PPO | Source: Ambulatory Visit | Attending: Internal Medicine | Admitting: Internal Medicine

## 2018-03-30 ENCOUNTER — Encounter (HOSPITAL_COMMUNITY): Payer: Self-pay

## 2018-03-30 DIAGNOSIS — H409 Unspecified glaucoma: Secondary | ICD-10-CM | POA: Diagnosis not present

## 2018-03-30 DIAGNOSIS — K449 Diaphragmatic hernia without obstruction or gangrene: Secondary | ICD-10-CM | POA: Diagnosis not present

## 2018-03-30 DIAGNOSIS — E785 Hyperlipidemia, unspecified: Secondary | ICD-10-CM | POA: Diagnosis not present

## 2018-03-30 DIAGNOSIS — K317 Polyp of stomach and duodenum: Secondary | ICD-10-CM | POA: Diagnosis not present

## 2018-03-30 DIAGNOSIS — Z01812 Encounter for preprocedural laboratory examination: Secondary | ICD-10-CM | POA: Insufficient documentation

## 2018-03-30 DIAGNOSIS — Z7901 Long term (current) use of anticoagulants: Secondary | ICD-10-CM | POA: Diagnosis not present

## 2018-03-30 DIAGNOSIS — K921 Melena: Secondary | ICD-10-CM | POA: Diagnosis not present

## 2018-03-30 DIAGNOSIS — I480 Paroxysmal atrial fibrillation: Secondary | ICD-10-CM | POA: Diagnosis not present

## 2018-03-30 DIAGNOSIS — K5900 Constipation, unspecified: Secondary | ICD-10-CM | POA: Diagnosis not present

## 2018-03-30 DIAGNOSIS — M1990 Unspecified osteoarthritis, unspecified site: Secondary | ICD-10-CM | POA: Diagnosis not present

## 2018-03-30 DIAGNOSIS — Z8614 Personal history of Methicillin resistant Staphylococcus aureus infection: Secondary | ICD-10-CM | POA: Diagnosis not present

## 2018-03-30 DIAGNOSIS — Z86711 Personal history of pulmonary embolism: Secondary | ICD-10-CM | POA: Diagnosis not present

## 2018-03-30 DIAGNOSIS — Z87891 Personal history of nicotine dependence: Secondary | ICD-10-CM | POA: Diagnosis not present

## 2018-03-30 DIAGNOSIS — Z86718 Personal history of other venous thrombosis and embolism: Secondary | ICD-10-CM | POA: Diagnosis not present

## 2018-03-30 DIAGNOSIS — K219 Gastro-esophageal reflux disease without esophagitis: Secondary | ICD-10-CM | POA: Diagnosis not present

## 2018-03-30 DIAGNOSIS — K222 Esophageal obstruction: Secondary | ICD-10-CM | POA: Diagnosis not present

## 2018-03-30 DIAGNOSIS — Z79899 Other long term (current) drug therapy: Secondary | ICD-10-CM | POA: Diagnosis not present

## 2018-03-30 DIAGNOSIS — I10 Essential (primary) hypertension: Secondary | ICD-10-CM | POA: Diagnosis not present

## 2018-03-30 LAB — BASIC METABOLIC PANEL
Anion gap: 9 (ref 5–15)
BUN: 20 mg/dL (ref 8–23)
CO2: 25 mmol/L (ref 22–32)
Calcium: 8.9 mg/dL (ref 8.9–10.3)
Chloride: 106 mmol/L (ref 98–111)
Creatinine, Ser: 1.02 mg/dL (ref 0.61–1.24)
GFR calc Af Amer: >60 mL/min (ref >60)
GFR calc non Af Amer: >60 mL/min (ref >60)
Glucose, Bld: 97 mg/dL (ref 70–99)
Potassium: 3.6 mmol/L (ref 3.5–5.1)
Sodium: 140 mmol/L (ref 135–145)

## 2018-03-30 NOTE — Patient Instructions (Signed)
[name]  03/30/2018     @PREFPERIOPPHARMACY @   Your procedure is scheduled on  04/01/2018 .  Report to [facility] at  845   A.M.  Call this number if you have problems the morning of surgery:  [phone]   Remember:  Follow the diet and prep instructions given to you by Dr Olevia Perches office.                      Take these medicines the morning of surgery with A SIP OF WATER Norvasc, oxycodone( if needed), protonix, ramipril.    Do not wear jewelry, make-up or nail polish.  Do not wear lotions, powders, or perfumes, or deodorant.  Do not shave 48 hours prior to surgery.  Men may shave face and neck.  Do not bring valuables to the hospital.  [facility] is not responsible for any belongings or valuables.  Contacts, dentures or bridgework may not be worn into surgery.  Leave your suitcase in the car.  After surgery it may be brought to your room.  For patients admitted to the hospital, discharge time will be determined by your treatment team.  Patients discharged the day of surgery will not be allowed to drive home.   Name and phone number of your driver:   family Special instructions:  None  Please read over the following fact sheets that you were given. Anesthesia Post-op Instructions and Care and Recovery After Surgery       Esophagogastroduodenoscopy Esophagogastroduodenoscopy (EGD) is a procedure to examine the lining of the esophagus, stomach, and first part of the small intestine (duodenum). This procedure is done to check for problems such as inflammation, bleeding, ulcers, or growths. During this procedure, a long, flexible, lighted tube with a camera attached (endoscope) is inserted down the throat. Tell a health care provider about:  Any allergies you have.  All medicines you are taking, including vitamins, herbs, eye drops, creams, and over-the-counter medicines.  Any problems you or family members have had with anesthetic  medicines.  Any blood disorders you have.  Any surgeries you have had.  Any medical conditions you have.  Whether you are pregnant or may be pregnant. What are the risks? Generally, this is a safe procedure. However, problems may occur, including:  Infection.  Bleeding.  A tear (perforation) in the esophagus, stomach, or duodenum.  Trouble breathing.  Excessive sweating.  Spasms of the larynx.  A slowed heartbeat.  Low blood pressure.  What happens before the procedure?  Follow instructions from your health care provider about eating or drinking restrictions.  Ask your health care provider about: ? Changing or stopping your regular medicines. This is especially important if you are taking diabetes medicines or blood thinners. ? Taking medicines such as aspirin and ibuprofen. These medicines can thin your blood. Do not take these medicines before your procedure if your health care provider instructs you not to.  Plan to have someone take you home after the procedure.  If you wear dentures, be ready to remove them before the procedure. What happens during the procedure?  To reduce your risk of infection, your health care team will wash or sanitize their hands.  An IV tube will be put in a vein in your hand or arm. You will get medicines and fluids through this tube.  You will be given one or more of the following: ? A medicine to help  you relax (sedative). ? A medicine to numb the area (local anesthetic). This medicine may be sprayed into your throat. It will make you feel more comfortable and keep you from gagging or coughing during the procedure. ? A medicine for pain.  A mouth guard may be placed in your mouth to protect your teeth and to keep you from biting on the endoscope.  You will be asked to lie on your left side.  The endoscope will be lowered down your throat into your esophagus, stomach, and duodenum.  Air will be put into the endoscope. This will  help your health care provider see better.  The lining of your esophagus, stomach, and duodenum will be examined.  Your health care provider may: ? Take a tissue sample so it can be looked at in a lab (biopsy). ? Remove growths. ? Remove objects (foreign bodies) that are stuck. ? Treat any bleeding with medicines or other devices that stop tissue from bleeding. ? Widen (dilate) or stretch narrowed areas of your esophagus and stomach.  The endoscope will be taken out. The procedure may vary among health care providers and hospitals. What happens after the procedure?  Your blood pressure, heart rate, breathing rate, and blood oxygen level will be monitored often until the medicines you were given have worn off.  Do not eat or drink anything until the numbing medicine has worn off and your gag reflex has returned. This information is not intended to replace advice given to you by your health care provider. Make sure you discuss any questions you have with your health care provider. Document Released: 09/04/2004 Document Revised: 10/10/2015 Document Reviewed: 03/28/2015 Elsevier Interactive Patient Education  2018 [employer]. Esophagogastroduodenoscopy, Care After Refer to this sheet in the next few weeks. These instructions provide you with information about caring for yourself after your procedure. Your health care provider may also give you more specific instructions. Your treatment has been planned according to current medical practices, but problems sometimes occur. Call your health care provider if you have any problems or questions after your procedure. What can I expect after the procedure? After the procedure, it is common to have:  A sore throat.  Nausea.  Bloating.  Dizziness.  Fatigue.  Follow these instructions at home:  Do not eat or drink anything until the numbing medicine (local anesthetic) has worn off and your gag reflex has returned. You will know that the  local anesthetic has worn off when you can swallow comfortably.  Do not drive for 24 hours if you received a medicine to help you relax (sedative).  If your health care provider took a tissue sample for testing during the procedure, make sure to get your test results. This is your responsibility. Ask your health care provider or the department performing the test when your results will be ready.  Keep all follow-up visits as told by your health care provider. This is important. Contact a health care provider if:  You cannot stop coughing.  You are not urinating.  You are urinating less than usual. Get help right away if:  You have trouble swallowing.  You cannot eat or drink.  You have throat or chest pain that gets worse.  You are dizzy or light-headed.  You faint.  You have nausea or vomiting.  You have chills.  You have a fever.  You have severe abdominal pain.  You have black, tarry, or bloody stools. This information is not intended to replace advice given to  you by your health care provider. Make sure you discuss any questions you have with your health care provider. Document Released: 04/20/2012 Document Revised: 10/10/2015 Document Reviewed: 03/28/2015 Elsevier Interactive Patient Education  2018 [place] Anesthesia is a term that refers to techniques, procedures, and medicines that help a person stay safe and comfortable during a medical procedure. Monitored anesthesia care, or sedation, is one type of anesthesia. Your anesthesia specialist may recommend sedation if you will be having a procedure that does not require you to be unconscious, such as:  Cataract surgery.  A dental procedure.  A biopsy.  A colonoscopy.  During the procedure, you may receive a medicine to help you relax (sedative). There are three levels of sedation:  Mild sedation. At this level, you may feel awake and relaxed. You will be able to follow  directions.  Moderate sedation. At this level, you will be sleepy. You may not remember the procedure.  Deep sedation. At this level, you will be asleep. You will not remember the procedure.  The more medicine you are given, the deeper your level of sedation will be. Depending on how you respond to the procedure, the anesthesia specialist may change your level of sedation or the type of anesthesia to fit your needs. An anesthesia specialist will monitor you closely during the procedure. Let your health care provider know about:  Any allergies you have.  All medicines you are taking, including vitamins, herbs, eye drops, creams, and over-the-counter medicines.  Any use of steroids (by mouth or as a cream).  Any problems you or family members have had with sedatives and anesthetic medicines.  Any blood disorders you have.  Any surgeries you have had.  Any medical conditions you have, such as sleep apnea.  Whether you are pregnant or may be pregnant.  Any use of cigarettes, alcohol, or street drugs. What are the risks? Generally, this is a safe procedure. However, problems may occur, including:  Getting too much medicine (oversedation).  Nausea.  Allergic reaction to medicines.  Trouble breathing. If this happens, a breathing tube may be used to help with breathing. It will be removed when you are awake and breathing on your own.  Heart trouble.  Lung trouble.  Before the procedure Staying hydrated Follow instructions from your health care provider about hydration, which may include:  Up to 2 hours before the procedure - you may continue to drink clear liquids, such as water, clear fruit juice, black coffee, and plain tea.  Eating and drinking restrictions Follow instructions from your health care provider about eating and drinking, which may include:  8 hours before the procedure - stop eating heavy meals or foods such as meat, fried foods, or fatty foods.  6 hours  before the procedure - stop eating light meals or foods, such as toast or cereal.  6 hours before the procedure - stop drinking milk or drinks that contain milk.  2 hours before the procedure - stop drinking clear liquids.  Medicines Ask your health care provider about:  Changing or stopping your regular medicines. This is especially important if you are taking diabetes medicines or blood thinners.  Taking medicines such as aspirin and ibuprofen. These medicines can thin your blood. Do not take these medicines before your procedure if your health care provider instructs you not to.  Tests and exams  You will have a physical exam.  You may have blood tests done to show: ? How well your  kidneys and liver are working. ? How well your blood can clot.  General instructions  Plan to have someone take you home from the hospital or clinic.  If you will be going home right after the procedure, plan to have someone with you for 24 hours.  What happens during the procedure?  Your blood pressure, heart rate, breathing, level of pain and overall condition will be monitored.  An IV tube will be inserted into one of your veins.  Your anesthesia specialist will give you medicines as needed to keep you comfortable during the procedure. This may mean changing the level of sedation.  The procedure will be performed. After the procedure  Your blood pressure, heart rate, breathing rate, and blood oxygen level will be monitored until the medicines you were given have worn off.  Do not drive for 24 hours if you received a sedative.  You may: ? Feel sleepy, clumsy, or nauseous. ? Feel forgetful about what happened after the procedure. ? Have a sore throat if you had a breathing tube during the procedure. ? Vomit. This information is not intended to replace advice given to you by your health care provider. Make sure you discuss any questions you have with your health care provider. Document  Released: 01/28/2005 Document Revised: 10/11/2015 Document Reviewed: 08/25/2015 Elsevier Interactive Patient Education  2018 [place], Care After These instructions provide you with information about caring for yourself after your procedure. Your health care provider may also give you more specific instructions. Your treatment has been planned according to current medical practices, but problems sometimes occur. Call your health care provider if you have any problems or questions after your procedure. What can I expect after the procedure? After your procedure, it is common to:  Feel sleepy for several hours.  Feel clumsy and have poor balance for several hours.  Feel forgetful about what happened after the procedure.  Have poor judgment for several hours.  Feel nauseous or vomit.  Have a sore throat if you had a breathing tube during the procedure.  Follow these instructions at home: For at least 24 hours after the procedure:   Do not: ? Participate in activities in which you could fall or become injured. ? Drive. ? Use heavy machinery. ? Drink alcohol. ? Take sleeping pills or medicines that cause drowsiness. ? Make important decisions or sign legal documents. ? Take care of children on your own.  Rest. Eating and drinking  Follow the diet that is recommended by your health care provider.  If you vomit, drink water, juice, or soup when you can drink without vomiting.  Make sure you have little or no nausea before eating solid foods. General instructions  Have a responsible adult stay with you until you are awake and alert.  Take over-the-counter and prescription medicines only as told by your health care provider.  If you smoke, do not smoke without supervision.  Keep all follow-up visits as told by your health care provider. This is important. Contact a health care provider if:  You keep feeling nauseous or you keep  vomiting.  You feel light-headed.  You develop a rash.  You have a fever. Get help right away if:  You have trouble breathing. This information is not intended to replace advice given to you by your health care provider. Make sure you discuss any questions you have with your health care provider. Document Released: 08/25/2015 Document Revised: 12/25/2015 Document Reviewed: 08/25/2015 Elsevier Interactive Patient  Education  2018 [employer]  

## 2018-04-01 ENCOUNTER — Ambulatory Visit (HOSPITAL_COMMUNITY): Payer: PPO | Admitting: Anesthesiology

## 2018-04-01 ENCOUNTER — Ambulatory Visit (HOSPITAL_COMMUNITY)
Admission: RE | Admit: 2018-04-01 | Discharge: 2018-04-01 | Disposition: A | Discharge status: Home / Self Care | Payer: PPO | Source: Ambulatory Visit | Attending: Internal Medicine | Admitting: Internal Medicine

## 2018-04-01 ENCOUNTER — Encounter (HOSPITAL_COMMUNITY)
Admission: RE | Disposition: A | Discharge status: Home / Self Care | Payer: Self-pay | Source: Ambulatory Visit | Attending: Internal Medicine

## 2018-04-01 DIAGNOSIS — Z79899 Other long term (current) drug therapy: Secondary | ICD-10-CM | POA: Insufficient documentation

## 2018-04-01 DIAGNOSIS — Z86711 Personal history of pulmonary embolism: Secondary | ICD-10-CM | POA: Insufficient documentation

## 2018-04-01 DIAGNOSIS — K5900 Constipation, unspecified: Secondary | ICD-10-CM | POA: Insufficient documentation

## 2018-04-01 DIAGNOSIS — K219 Gastro-esophageal reflux disease without esophagitis: Secondary | ICD-10-CM | POA: Diagnosis not present

## 2018-04-01 DIAGNOSIS — I480 Paroxysmal atrial fibrillation: Secondary | ICD-10-CM | POA: Insufficient documentation

## 2018-04-01 DIAGNOSIS — H409 Unspecified glaucoma: Secondary | ICD-10-CM | POA: Diagnosis not present

## 2018-04-01 DIAGNOSIS — K317 Polyp of stomach and duodenum: Secondary | ICD-10-CM | POA: Diagnosis not present

## 2018-04-01 DIAGNOSIS — K222 Esophageal obstruction: Secondary | ICD-10-CM | POA: Insufficient documentation

## 2018-04-01 DIAGNOSIS — Z8614 Personal history of Methicillin resistant Staphylococcus aureus infection: Secondary | ICD-10-CM | POA: Diagnosis not present

## 2018-04-01 DIAGNOSIS — Z86718 Personal history of other venous thrombosis and embolism: Secondary | ICD-10-CM | POA: Insufficient documentation

## 2018-04-01 DIAGNOSIS — K921 Melena: Secondary | ICD-10-CM | POA: Insufficient documentation

## 2018-04-01 DIAGNOSIS — Z7901 Long term (current) use of anticoagulants: Secondary | ICD-10-CM | POA: Insufficient documentation

## 2018-04-01 DIAGNOSIS — K228 Other specified diseases of esophagus: Secondary | ICD-10-CM | POA: Diagnosis not present

## 2018-04-01 DIAGNOSIS — M1990 Unspecified osteoarthritis, unspecified site: Secondary | ICD-10-CM | POA: Insufficient documentation

## 2018-04-01 DIAGNOSIS — I10 Essential (primary) hypertension: Secondary | ICD-10-CM | POA: Diagnosis not present

## 2018-04-01 DIAGNOSIS — Z87891 Personal history of nicotine dependence: Secondary | ICD-10-CM | POA: Insufficient documentation

## 2018-04-01 DIAGNOSIS — E785 Hyperlipidemia, unspecified: Secondary | ICD-10-CM | POA: Insufficient documentation

## 2018-04-01 DIAGNOSIS — K449 Diaphragmatic hernia without obstruction or gangrene: Secondary | ICD-10-CM | POA: Insufficient documentation

## 2018-04-01 HISTORY — PX: BIOPSY: SHX5522

## 2018-04-01 HISTORY — PX: ESOPHAGOGASTRODUODENOSCOPY (EGD) WITH PROPOFOL: SHX5813

## 2018-04-01 SURGERY — ESOPHAGOGASTRODUODENOSCOPY (EGD) WITH PROPOFOL
Anesthesia: Monitor Anesthesia Care

## 2018-04-01 MED ORDER — PROPOFOL 500 MG/50ML IV EMUL
INTRAVENOUS | Status: DC | PRN
  Administered 2018-04-01: 150 ug/kg/min via INTRAVENOUS

## 2018-04-01 MED ORDER — LACTATED RINGERS IV SOLN
INTRAVENOUS | Status: DC
  Administered 2018-04-01: 09:00:00 via INTRAVENOUS

## 2018-04-01 MED ORDER — STERILE WATER FOR IRRIGATION IR SOLN
Status: DC | PRN
  Administered 2018-04-01: 1.5 mL

## 2018-04-01 MED ORDER — ESOMEPRAZOLE MAGNESIUM 40 MG PO CPDR: 40.0000 mg | DELAYED_RELEASE_CAPSULE | Freq: Every day | ORAL | 5 refills | Status: DC

## 2018-04-01 MED ORDER — PROPOFOL 10 MG/ML IV BOLUS
INTRAVENOUS | Status: DC | PRN
  Administered 2018-04-01: 80 mg via INTRAVENOUS
  Administered 2018-04-01: 20 mg via INTRAVENOUS
  Administered 2018-04-01: 10 mg via INTRAVENOUS

## 2018-04-01 NOTE — Op Note (Signed)
[facility] Patient Name: [name] Procedure Date: 04/01/2018 10:12 AM MRN: [id number] Date of Birth: [name] 29, 1940 Attending MD: Hildred Laser , MD CSN: [id number] Age: 77 Admit Type: Outpatient Procedure:                Upper GI endoscopy Indications:              Melena Providers:                Hildred Laser, MD, Lurline Del, RN, Gerome Sam,                            RN, Nelma Rothman, Technician Referring MD:             Elayne Snare Luking, MD Medicines:                Propofol per Anesthesia Complications:            No immediate complications. Estimated Blood Loss:     Estimated blood loss was minimal. Procedure:                Pre-Anesthesia Assessment:                           - Prior to the procedure, a History and Physical                            was performed, and patient medications and                            allergies were reviewed. The patient's tolerance of                            previous anesthesia was also reviewed. The risks                            and benefits of the procedure and the sedation                            options and risks were discussed with the patient.                            All questions were answered, and informed consent                            was obtained. Prior Anticoagulants: The patient                            last took Pradaxa (dabigatran) 3 days prior to the                            procedure. ASA Grade Assessment: III - A patient                            with severe systemic disease. After reviewing the  risks and benefits, the patient was deemed in                            satisfactory condition to undergo the procedure.                           After obtaining informed consent, the endoscope was                            passed under direct vision. Throughout the                            procedure, the patient's blood pressure, pulse, and   oxygen saturations were monitored continuously. The                            [id number]-[id number] ([id number]) was introduced through the and                            advanced to the second part of duodenum. The upper                            GI endoscopy was accomplished without difficulty.                            The patient tolerated the procedure well. Scope In: 10:28:37 AM Scope Out: 10:38:32 AM Total Procedure Duration: 0 hours 9 minutes 55 seconds  Findings:      The proximal esophagus was normal.      Diffuse mild mucosal changes characterized by longitudinal markings and       circumferential folds were found in the mid esophagus and in the distal       esophagus. Biopsies were taken with a cold forceps for histology. The       pathology specimen was placed into Bottle Number 2.      The Z-line was irregular and was found 38 cm from the incisors.      A 2 cm hiatal hernia was present.      Multiple small pedunculated and sessile polyps with no bleeding and no       stigmata of recent bleeding were found in the gastric fundus and in the       gastric body. Biopsies were taken with a cold forceps for histology. The       pathology specimen was placed into Bottle Number 1.      The cardia, gastric antrum and pylorus were normal.      The duodenal bulb and second portion of the duodenum were normal. Impression:               - Normal proximal esophagus.                           - Longitudinal lines and circumferential rings in                            mid and distal esophagus. Biopsied.                           -  Z-line irregular, 38 cm from the incisors.                           - 2 cm hiatal hernia.                           - Multiple gastric polyps. Biopsied.                           - Normal cardia, antrum and pylorus.                           - Normal duodenal bulb and second portion of the                            duodenum.                           Comment: no bleeding  lesion identified. Moderate Sedation:      Per Anesthesia Care Recommendation:           - Patient has a contact number available for                            emergencies. The signs and symptoms of potential                            delayed complications were discussed with the                            patient. Return to normal activities tomorrow.                            Written discharge instructions were provided to the                            patient.                           - Resume previous diet today.                           - Continue present medications but discontinue                            Pantoprazole.                           - Await pathology results.                           - Resume Pradaxa (dabigatran) at prior dose                            tomorrow.                           - Nexium (esomeprazole) 40 mg  PO daily. Procedure Code(s):        --- Professional ---                           [id number], Esophagogastroduodenoscopy, flexible,                            transoral; with biopsy, single or multiple Diagnosis Code(s):        --- Professional ---                           K22.8, Other specified diseases of esophagus                           K44.9, Diaphragmatic hernia without obstruction or                            gangrene                           K31.7, Polyp of stomach and duodenum                           K92.1, Melena (includes Hematochezia) CPT copyright 2018 American Medical Association. All rights reserved. The codes documented in this report are preliminary and upon coder review may  be revised to meet current compliance requirements. Hildred Laser, MD Hildred Laser, MD 04/01/2018 10:50:24 AM This report has been signed electronically. Number of Addenda: 0

## 2018-04-01 NOTE — Discharge Instructions (Signed)
Resume Pradaxa tomorrow afternoon. Discontinue pantoprazole. Resume other medications as before. Resume usual diet. No driving for 24 hours. Physician will call with biopsy results and further recommendation   Upper Endoscopy, Care After Refer to this sheet in the next few weeks. These instructions provide you with information about caring for yourself after your procedure. Your health care provider may also give you more specific instructions. Your treatment has been planned according to current medical practices, but problems sometimes occur. Call your health care provider if you have any problems or questions after your procedure. What can I expect after the procedure? After the procedure, it is common to have:  A sore throat.  Bloating.  Nausea.  Follow these instructions at home:  Follow instructions from your health care provider about what to eat or drink after your procedure.  Return to your normal activities as told by your health care provider. Ask your health care provider what activities are safe for you.  Take over-the-counter and prescription medicines only as told by your health care provider.  Do not drive for 24 hours if you received a sedative.  Keep all follow-up visits as told by your health care provider. This is important. Contact a health care provider if:  You have a sore throat that lasts longer than one day.  You have trouble swallowing. Get help right away if:  You have a fever.  You vomit blood or your vomit looks like coffee grounds.  You have bloody, black, or tarry stools.  You have a severe sore throat or you cannot swallow.  You have difficulty breathing.  You have severe pain in your chest or belly. This information is not intended to replace advice given to you by your health care provider. Make sure you discuss any questions you have with your health care provider. Document Released: 11/03/2011 Document Revised: 10/10/2015 Document  Reviewed: 02/14/2015 Elsevier Interactive Patient Education  2018 [place] Anesthesia is a term that refers to techniques, procedures, and medicines that help a person stay safe and comfortable during a medical procedure. Monitored anesthesia care, or sedation, is one type of anesthesia. Your anesthesia specialist may recommend sedation if you will be having a procedure that does not require you to be unconscious, such as: Cataract surgery. A dental procedure. A biopsy. A colonoscopy.  During the procedure, you may receive a medicine to help you relax (sedative). There are three levels of sedation: Mild sedation. At this level, you may feel awake and relaxed. You will be able to follow directions. Moderate sedation. At this level, you will be sleepy. You may not remember the procedure. Deep sedation. At this level, you will be asleep. You will not remember the procedure.  The more medicine you are given, the deeper your level of sedation will be. Depending on how you respond to the procedure, the anesthesia specialist may change your level of sedation or the type of anesthesia to fit your needs. An anesthesia specialist will monitor you closely during the procedure. Let your health care provider know about: Any allergies you have. All medicines you are taking, including vitamins, herbs, eye drops, creams, and over-the-counter medicines. Any use of steroids (by mouth or as a cream). Any problems you or family members have had with sedatives and anesthetic medicines. Any blood disorders you have. Any surgeries you have had. Any medical conditions you have, such as sleep apnea. Whether you are pregnant or may be pregnant. Any use of cigarettes,  alcohol, or street drugs. What are the risks? Generally, this is a safe procedure. However, problems may occur, including: Getting too much medicine (oversedation). Nausea. Allergic reaction to medicines. Trouble  breathing. If this happens, a breathing tube may be used to help with breathing. It will be removed when you are awake and breathing on your own. Heart trouble. Lung trouble.  Before the procedure Staying hydrated Follow instructions from your health care provider about hydration, which may include: Up to 2 hours before the procedure - you may continue to drink clear liquids, such as water, clear fruit juice, black coffee, and plain tea.  Eating and drinking restrictions Follow instructions from your health care provider about eating and drinking, which may include: 8 hours before the procedure - stop eating heavy meals or foods such as meat, fried foods, or fatty foods. 6 hours before the procedure - stop eating light meals or foods, such as toast or cereal. 6 hours before the procedure - stop drinking milk or drinks that contain milk. 2 hours before the procedure - stop drinking clear liquids.  Medicines Ask your health care provider about: Changing or stopping your regular medicines. This is especially important if you are taking diabetes medicines or blood thinners. Taking medicines such as aspirin and ibuprofen. These medicines can thin your blood. Do not take these medicines before your procedure if your health care provider instructs you not to.  Tests and exams You will have a physical exam. You may have blood tests done to show: How well your kidneys and liver are working. How well your blood can clot.  General instructions Plan to have someone take you home from the hospital or clinic. If you will be going home right after the procedure, plan to have someone with you for 24 hours.  What happens during the procedure? Your blood pressure, heart rate, breathing, level of pain and overall condition will be monitored. An IV tube will be inserted into one of your veins. Your anesthesia specialist will give you medicines as needed to keep you comfortable during the procedure.  This may mean changing the level of sedation. The procedure will be performed. After the procedure Your blood pressure, heart rate, breathing rate, and blood oxygen level will be monitored until the medicines you were given have worn off. Do not drive for 24 hours if you received a sedative. You may: Feel sleepy, clumsy, or nauseous. Feel forgetful about what happened after the procedure. Have a sore throat if you had a breathing tube during the procedure. Vomit. This information is not intended to replace advice given to you by your health care provider. Make sure you discuss any questions you have with your health care provider. Document Released: 01/28/2005 Document Revised: 10/11/2015 Document Reviewed: 08/25/2015 Elsevier Interactive Patient Education  [employer].

## 2018-04-01 NOTE — Anesthesia Procedure Notes (Signed)
Procedure Name: MAC Date/Time: 04/01/2018 10:22 AM Performed by: Vista Deck, CRNA Pre-anesthesia Checklist: Patient identified, Emergency Drugs available, Suction available, Timeout performed and Patient being monitored Patient Re-evaluated:Patient Re-evaluated prior to induction Oxygen Delivery Method: Nasal Cannula

## 2018-04-01 NOTE — H&P (Signed)
[name] is an 77 y.o. male.   Chief Complaint: Patient is here for EGD. HPI: Patient is 77 year old Caucasian male who experienced melena about 2 weeks ago.  He stopped Pradaxa.  He follow with Dr. [name].  His stool was heme positive.  His H&H was normal.  Patient is off Pradaxa for about 4 to 5 days and then is gone back on it.  He stopped it 3 days ago for this procedure. He denies nausea or vomiting but lately he has had frequent heartburn.  He feels pantoprazole is not working.  He is taking Pepcid AC at bedtime.  He states he ate to apples the day before melena started and he felt his esophagus was raw.  He also has been taking Mylanta on as-needed basis.  He denies dysphagia anorexia or weight loss.  He is up-to-date on CRC screening.  Colonoscopy was in March 2018 because he has history of colonic polyps. Does not take aspirin or other NSAIDs.  Past Medical History:  Diagnosis Date  . Arthritis   . Cancer ([place])    skin  . Carpal tunnel syndrome   . Cataract    left eye  . Chronic back pain    multiple back surgeries  . Complication of anesthesia    pt states woke up during hemorroid surgery  . Constipation    Metamucil every mornind  . DVT (deep venous thrombosis) ([place]) 03/2007   left leg   . GERD (gastroesophageal reflux disease)    takes Protonix daily  . Glaucoma   . History of colon polyps   . History of hyperglycemia   . History of MRSA infection 1992  . HTN (hypertension)    takes  Ramipril daily  . Hyperlipidemia    takes Pravastatin 3 times a week  . Joint pain   . Joint swelling   . Nocturia   . Paroxysmal atrial fibrillation ([facility])    takes Pradaxa daily, Chads2vasc score 2(07/11/14)  . Pneumonia    many yrs ago  . Pulmonary embolism ([place]) 2008  . Urinary frequency   . Urinary urgency     Past Surgical History:  Procedure Laterality Date  . CARDIAC CATHETERIZATION  2008  . CARPAL TUNNEL RELEASE Right   . cataract surgery Right   . cervical  and lumbar surgery    . COLONOSCOPY  04/24/2011   Procedure: COLONOSCOPY;  Surgeon: Rogene Houston, MD;  [facility];  Service: Endoscopy;  Laterality: N/A;  1200  . COLONOSCOPY N/A 08/13/2016   Procedure: COLONOSCOPY;  Surgeon: Rogene Houston, MD;  [facility];  Service: Endoscopy;  Laterality: N/A;  1200  . ESOPHAGOGASTRODUODENOSCOPY  03/10/2012   Procedure: ESOPHAGOGASTRODUODENOSCOPY (EGD);  Surgeon: Rogene Houston, MD;  [facility];  Service: Endoscopy;  Laterality: N/A;  325  . HARDWARE REMOVAL N/A 04/24/2014   Procedure: REMOVAL OF SACRAL INSTRUMENTATION WITH [place];  Surgeon: Kristeen Miss, MD;  Location: [facility];  Service: Neurosurgery;  Laterality: N/A;  . HEMORRHOIDECTOMY WITH HEMORRHOID BANDING    . KNEE ARTHROSCOPY Right   . KNEE SURGERY     left  . LASIK    . POLYPECTOMY  08/13/2016   Procedure: POLYPECTOMY;  Surgeon: Rogene Houston, MD;  [facility];  Service: Endoscopy;;  colon  . PVI  01/24/10   afib ablation    Family History  Problem Relation Age of Onset  . Hypertension Mother   . Transient  ischemic attack Mother   . Heart disease Father   . Diabetes Brother   . Healthy Daughter   . Colon cancer Sister   . Hypertension Unknown   . Heart disease Unknown    Social History:  reports that he quit smoking about 49 years ago. His smoking use included cigarettes. He has a 0.75 pack-year smoking history. He has never used smokeless tobacco. He reports that he drinks about 7.0 standard drinks of alcohol per week. He reports that he does not use drugs.  Allergies:  Allergies  Allergen Reactions  . Sectral [Acebutolol Hcl] Other (See Comments)    Side effects  . Sulfonamide Derivatives Other (See Comments)    Runs blood pressure up  . Zocor [Simvastatin] Other (See Comments)    Joint pain    Medications Prior to Admission  Medication Sig Dispense Refill  . amLODipine (NORVASC) 5 MG tablet Take 1 tablet (5 mg  total) by mouth daily. 90 tablet 1  . dabigatran (PRADAXA) 150 MG CAPS capsule TAKE ONE CAPSULE BY MOUTH EVERY 12 HOURS (Patient taking differently: Take 150 mg by mouth every 12 (twelve) hours. TAKE ONE CAPSULE BY MOUTH EVERY 12 HOURS) 180 capsule 2  . diphenhydrAMINE-APAP, sleep, (TYLENOL PM EXTRA STRENGTH PO) Take 1 tablet by mouth at bedtime as needed (sleep).     . indapamide (LOZOL) 2.5 MG tablet Take 1 tablet (2.5 mg total) by mouth daily. 90 tablet 1  . latanoprost (XALATAN) 0.005 % ophthalmic solution Place 1 drop into both eyes at bedtime.    . Oxycodone HCl 10 MG TABS One 4 times a day prn pain (Patient taking differently: Take 10 mg by mouth 2 (two) times daily. Twice a day) 120 tablet 0  . pantoprazole (PROTONIX) 40 MG tablet Take 1 tablet (40 mg total) by mouth daily. 90 tablet 4  . pravastatin (PRAVACHOL) 80 MG tablet Take 1 tablet (80 mg total) by mouth daily. 90 tablet 1  . ramipril (ALTACE) 10 MG capsule Take 1 capsule (10 mg total) by mouth 2 (two) times daily. 180 capsule 1  . diazepam (VALIUM) 5 MG tablet Take 1 tablet (5 mg total) by mouth 2 (two) times daily. (Patient not taking: Reported on 03/28/2018) 10 tablet 0    Results for orders placed or performed during the hospital encounter of 03/30/18 (from the past 48 hour(s))  Basic metabolic panel     Status: None   Collection Time: 03/30/18  2:44 PM  Result Value Ref Range   Sodium 140 135 - 145 mmol/L   Potassium 3.6 3.5 - 5.1 mmol/L   Chloride 106 98 - 111 mmol/L   CO2 25 22 - 32 mmol/L   Glucose, Bld 97 70 - 99 mg/dL   BUN 20 8 - 23 mg/dL   Creatinine, Ser 1.02 0.61 - 1.24 mg/dL   Calcium 8.9 8.9 - 10.3 mg/dL   GFR calc non Af Amer >60 >60 mL/min   GFR calc Af Amer >60 >60 mL/min    Comment: (NOTE) The eGFR has been calculated using the CKD EPI equation. This calculation has not been validated in all clinical situations. eGFR's persistently <60 mL/min signify possible Chronic Kidney Disease.    Anion gap 9 5  - 15    Comment: Performed at [facility], [address], [place], [place] [address]   No results found.  ROS  Blood pressure (!) 175/84, pulse 64, temperature 98.3 F (36.8 C), resp. rate 18, SpO2 95 %. Physical Exam  Constitutional: He appears well-developed and well-nourished.  HENT:  Mouth/Throat: Oropharynx is clear and moist.  Eyes: Conjunctivae are normal. No scleral icterus.  Neck: No thyromegaly present.  Cardiovascular: Normal rate, regular rhythm and normal heart sounds.  No murmur heard. Respiratory: Effort normal and breath sounds normal.  GI: Soft. He exhibits no distension and no mass. There is no tenderness.  Musculoskeletal: He exhibits no edema.  Neurological: He is alert.  Skin: Skin is warm.     Assessment/Plan History of melena in the setting of anticoagulation.  Chronic GERD.  Symptom control not satisfactory with current therapy. Diagnostic EGD.  Hildred Laser, MD 04/01/2018, 10:18 AM

## 2018-04-01 NOTE — Transfer of Care (Signed)
Immediate Anesthesia Transfer of Care Note  Patient: [name]  Procedure(s) Performed: ESOPHAGOGASTRODUODENOSCOPY (EGD) WITH PROPOFOL (N/A ) BIOPSY  Patient Location: Short Stay  Anesthesia Type:MAC  Level of Consciousness: awake, alert  and patient cooperative  Airway & Oxygen Therapy: Patient Spontanous Breathing  Post-op Assessment: Report given to RN and Post -op Vital signs reviewed and stable  Post vital signs: Reviewed and stable  Last Vitals:  Vitals Value Taken Time  BP    Temp    Pulse    Resp    SpO2      Last Pain:  Vitals:   04/01/18 0900  PainSc: 0-No pain         Complications: No apparent anesthesia complications

## 2018-04-01 NOTE — Anesthesia Preprocedure Evaluation (Signed)
Anesthesia Evaluation    History of Anesthesia Complications (+) history of anesthetic complications  Airway Mallampati: II       Dental  (+) Teeth Intact, Partial Upper   Pulmonary pneumonia, resolved, former smoker,    breath sounds clear to auscultation       Cardiovascular hypertension, On Medications  Rhythm:regular     Neuro/Psych  Neuromuscular disease    GI/Hepatic GERD  ,  Endo/Other    Renal/GU      Musculoskeletal   Abdominal   Peds  Hematology   Anesthesia Other Findings   Reproductive/Obstetrics                             Anesthesia Physical Anesthesia Plan  ASA: III  Anesthesia Plan: MAC   Post-op Pain Management:    Induction:   PONV Risk Score and Plan:   Airway Management Planned:   Additional Equipment:   Intra-op Plan:   Post-operative Plan:   Informed Consent:   Plan Discussed with: Anesthesiologist  Anesthesia Plan Comments:         Anesthesia Quick Evaluation

## 2018-04-01 NOTE — Anesthesia Postprocedure Evaluation (Signed)
Anesthesia Post Note  Patient: [name]  Procedure(s) Performed: ESOPHAGOGASTRODUODENOSCOPY (EGD) WITH PROPOFOL (N/A ) BIOPSY  Patient location during evaluation: Short Stay Anesthesia Type: MAC Level of consciousness: awake and alert and patient cooperative Pain management: satisfactory to patient Vital Signs Assessment: post-procedure vital signs reviewed and stable Respiratory status: spontaneous breathing Cardiovascular status: stable Postop Assessment: no apparent nausea or vomiting Anesthetic complications: no     Last Vitals:  Vitals:   04/01/18 0900  BP: (!) 175/84  Pulse: 64  Resp: 18  Temp: 36.8 C  SpO2: 95%    Last Pain:  Vitals:   04/01/18 0900  PainSc: 0-No pain                 Dreyson Mishkin

## 2018-04-06 ENCOUNTER — Other Ambulatory Visit (INDEPENDENT_AMBULATORY_CARE_PROVIDER_SITE_OTHER): Payer: Self-pay | Admitting: Internal Medicine

## 2018-04-06 MED ORDER — PANTOPRAZOLE SODIUM 40 MG PO TBEC: 40.0000 mg | DELAYED_RELEASE_TABLET | Freq: Every day | ORAL | 5 refills | Status: DC

## 2018-04-06 MED ORDER — FAMOTIDINE 10 MG PO TABS: 10.0000 mg | ORAL_TABLET | Freq: Every day | ORAL | Status: DC

## 2018-04-07 ENCOUNTER — Encounter (HOSPITAL_COMMUNITY): Payer: Self-pay | Admitting: Internal Medicine

## 2018-04-20 ENCOUNTER — Ambulatory Visit (INDEPENDENT_AMBULATORY_CARE_PROVIDER_SITE_OTHER): Payer: PPO | Admitting: Orthopaedic Surgery

## 2018-04-20 ENCOUNTER — Ambulatory Visit (INDEPENDENT_AMBULATORY_CARE_PROVIDER_SITE_OTHER): Payer: PPO

## 2018-04-20 DIAGNOSIS — S82122A Displaced fracture of lateral condyle of left tibia, initial encounter for closed fracture: Secondary | ICD-10-CM

## 2018-04-20 NOTE — Progress Notes (Signed)
Patient: [name]           Date of Birth: 1941-01-04           MRN: [id number] Visit Date: 04/20/2018 PCP: Kathyrn Drown, MD   Assessment & Plan:  Chief Complaint:  Chief Complaint  Patient presents with  . Left Knee - Pain, Follow-up   Visit Diagnoses:  1. Closed fracture of lateral portion of left tibial plateau, initial encounter     Plan: Valentine is 2 months status post minimally depressed lateral tibial plateau fracture.  He comes in today for follow-up.  He reports no pain at rest or with activity.  He has no swelling on exam.  He has no tenderness to palpation.  At this point he has demonstrated healing of the fracture.  X-rays were reviewed with the patient today.  At this point we will see him back as needed.  Activity as tolerated.  Follow-Up Instructions: Return if symptoms worsen or fail to improve.   Orders:  Orders Placed This Encounter  Procedures  . XR KNEE 3 VIEW LEFT   No orders of the defined types were placed in this encounter.   Imaging: Xr Knee 3 View Left  Result Date: 04/20/2018 Healed tibial plateau fracture   PMFS History: Patient Active Problem List   Diagnosis Date Noted  . Melena 03/28/2018  . Closed fracture of lateral portion of left tibial plateau 03/09/2018  . Aortic atherosclerosis ([place]) 11/05/2016  . History of colonic polyps 06/16/2016  . Pseudoarthrosis of lumbar spine 04/24/2014  . Hx of adenomatous colonic polyps 11/22/2013  . Prediabetes 10/12/2013  . Fatigue 12/21/2012  . Snoring 12/21/2012  . Chronic back pain greater than 3 months duration 10/31/2012  . Sinusitis 02/22/2012  . GERD (gastroesophageal reflux disease) 02/22/2012  . Hyperlipidemia 01/21/2012  . HYPERTENSION, BENIGN 12/06/2009  . ATRIAL FIBRILLATION, PAROXYSMAL 12/06/2009   Past Medical History:  Diagnosis Date  . Arthritis   . Cancer ([place])    skin  . Carpal tunnel syndrome   . Cataract    left eye  . Chronic back pain    multiple back  surgeries  . Complication of anesthesia    pt states woke up during hemorroid surgery  . Constipation    Metamucil every mornind  . DVT (deep venous thrombosis) ([place]) 03/2007   left leg   . GERD (gastroesophageal reflux disease)    takes Protonix daily  . Glaucoma   . History of colon polyps   . History of hyperglycemia   . History of MRSA infection 1992  . HTN (hypertension)    takes  Ramipril daily  . Hyperlipidemia    takes Pravastatin 3 times a week  . Joint pain   . Joint swelling   . Nocturia   . Paroxysmal atrial fibrillation (HCC)    takes Pradaxa daily, Chads2vasc score 2(07/11/14)  . Pneumonia    many yrs ago  . Pulmonary embolism ([place]) 2008  . Urinary frequency   . Urinary urgency     Family History  Problem Relation Age of Onset  . Hypertension Mother   . Transient ischemic attack Mother   . Heart disease Father   . Diabetes Brother   . Healthy Daughter   . Colon cancer Sister   . Hypertension Unknown   . Heart disease Unknown     Past Surgical History:  Procedure Laterality Date  . BIOPSY  04/01/2018   Procedure: BIOPSY;  Surgeon: Rogene Houston,  MD;  [facility];  Service: Endoscopy;;  gastric  . CARDIAC CATHETERIZATION  2008  . CARPAL TUNNEL RELEASE Right   . cataract surgery Right   . cervical and lumbar surgery    . COLONOSCOPY  04/24/2011   Procedure: COLONOSCOPY;  Surgeon: Rogene Houston, MD;  [facility];  Service: Endoscopy;  Laterality: N/A;  1200  . COLONOSCOPY N/A 08/13/2016   Procedure: COLONOSCOPY;  Surgeon: Rogene Houston, MD;  [facility];  Service: Endoscopy;  Laterality: N/A;  1200  . ESOPHAGOGASTRODUODENOSCOPY  03/10/2012   Procedure: ESOPHAGOGASTRODUODENOSCOPY (EGD);  Surgeon: Rogene Houston, MD;  [facility];  Service: Endoscopy;  Laterality: N/A;  325  . ESOPHAGOGASTRODUODENOSCOPY (EGD) WITH PROPOFOL N/A 04/01/2018   Procedure: ESOPHAGOGASTRODUODENOSCOPY (EGD) WITH PROPOFOL;   Surgeon: Rogene Houston, MD;  [facility];  Service: Endoscopy;  Laterality: N/A;  . HARDWARE REMOVAL N/A 04/24/2014   Procedure: REMOVAL OF SACRAL INSTRUMENTATION WITH [place];  Surgeon: Kristeen Miss, MD;  Location: [facility];  Service: Neurosurgery;  Laterality: N/A;  . HEMORRHOIDECTOMY WITH HEMORRHOID BANDING    . KNEE ARTHROSCOPY Right   . KNEE SURGERY     left  . LASIK    . POLYPECTOMY  08/13/2016   Procedure: POLYPECTOMY;  Surgeon: Rogene Houston, MD;  [facility];  Service: Endoscopy;;  colon  . PVI  01/24/10   afib ablation   Social History   Occupational History  . Occupation: retired  Tobacco Use  . Smoking status: Former Smoker    Packs/day: 0.25    Years: 3.00    Pack years: 0.75    Types: Cigarettes    Last attempt to quit: 03/30/1969    Years since quitting: 49.0  . Smokeless tobacco: Never Used  Substance and Sexual Activity  . Alcohol use: Yes    Alcohol/week: 7.0 standard drinks    Types: 7 Glasses of wine per week    Comment: wine nightly  . Drug use: No  . Sexual activity: Yes

## 2018-04-21 DIAGNOSIS — C4339 Malignant melanoma of other parts of face: Secondary | ICD-10-CM | POA: Diagnosis not present

## 2018-04-21 DIAGNOSIS — L570 Actinic keratosis: Secondary | ICD-10-CM | POA: Diagnosis not present

## 2018-04-21 DIAGNOSIS — L905 Scar conditions and fibrosis of skin: Secondary | ICD-10-CM | POA: Diagnosis not present

## 2018-04-21 DIAGNOSIS — X32XXXD Exposure to sunlight, subsequent encounter: Secondary | ICD-10-CM | POA: Diagnosis not present

## 2018-04-25 DIAGNOSIS — H401112 Primary open-angle glaucoma, right eye, moderate stage: Secondary | ICD-10-CM | POA: Diagnosis not present

## 2018-05-19 ENCOUNTER — Ambulatory Visit: Payer: PPO | Admitting: Family Medicine

## 2018-05-23 DIAGNOSIS — Z08 Encounter for follow-up examination after completed treatment for malignant neoplasm: Secondary | ICD-10-CM | POA: Diagnosis not present

## 2018-05-23 DIAGNOSIS — L570 Actinic keratosis: Secondary | ICD-10-CM | POA: Diagnosis not present

## 2018-05-23 DIAGNOSIS — X32XXXD Exposure to sunlight, subsequent encounter: Secondary | ICD-10-CM | POA: Diagnosis not present

## 2018-05-23 DIAGNOSIS — L905 Scar conditions and fibrosis of skin: Secondary | ICD-10-CM | POA: Diagnosis not present

## 2018-05-23 DIAGNOSIS — Z85828 Personal history of other malignant neoplasm of skin: Secondary | ICD-10-CM | POA: Diagnosis not present

## 2018-05-25 ENCOUNTER — Ambulatory Visit (INDEPENDENT_AMBULATORY_CARE_PROVIDER_SITE_OTHER): Payer: PPO | Admitting: Family Medicine

## 2018-05-25 ENCOUNTER — Other Ambulatory Visit: Payer: Self-pay | Admitting: Family Medicine

## 2018-05-25 ENCOUNTER — Encounter: Payer: Self-pay | Admitting: Family Medicine

## 2018-05-25 VITALS — BP 128/70 | Ht 66.0 in | Wt 195.0 lb

## 2018-05-25 DIAGNOSIS — M549 Dorsalgia, unspecified: Secondary | ICD-10-CM | POA: Diagnosis not present

## 2018-05-25 DIAGNOSIS — E7849 Other hyperlipidemia: Secondary | ICD-10-CM | POA: Diagnosis not present

## 2018-05-25 DIAGNOSIS — G8929 Other chronic pain: Secondary | ICD-10-CM

## 2018-05-25 DIAGNOSIS — I700 Atherosclerosis of aorta: Secondary | ICD-10-CM | POA: Diagnosis not present

## 2018-05-25 DIAGNOSIS — I480 Paroxysmal atrial fibrillation: Secondary | ICD-10-CM | POA: Diagnosis not present

## 2018-05-25 DIAGNOSIS — I10 Essential (primary) hypertension: Secondary | ICD-10-CM

## 2018-05-25 MED ORDER — DABIGATRAN ETEXILATE MESYLATE 150 MG PO CAPS: ORAL_CAPSULE | ORAL | 2 refills | Status: DC

## 2018-05-25 MED ORDER — PRAVASTATIN SODIUM 80 MG PO TABS: 80.0000 mg | ORAL_TABLET | Freq: Every day | ORAL | 1 refills | Status: DC

## 2018-05-25 MED ORDER — INDAPAMIDE 2.5 MG PO TABS: 2.5000 mg | ORAL_TABLET | Freq: Every day | ORAL | 1 refills | Status: DC

## 2018-05-25 MED ORDER — OXYCODONE HCL 10 MG PO TABS: ORAL_TABLET | ORAL | 0 refills | Status: DC

## 2018-05-25 MED ORDER — AMLODIPINE BESYLATE 5 MG PO TABS: 5.0000 mg | ORAL_TABLET | Freq: Every day | ORAL | 1 refills | Status: DC

## 2018-05-25 MED ORDER — RAMIPRIL 10 MG PO CAPS: 10.0000 mg | ORAL_CAPSULE | Freq: Two times a day (BID) | ORAL | 1 refills | Status: DC

## 2018-05-25 NOTE — Progress Notes (Signed)
Subjective:e    Patient ID: [name], male    DOB: Apr 19, 1941, 78 y.o.   MRN: [id number]  HPI  Patient is here today to follow up on his chronic health issues.  This patient was seen today for chronic pain  The medication list was reviewed and updated.   -Compliance with medication: Good compliance  - Number patient states they take daily: Takes 3 or 4/day  -when was the last dose patient took?  Earlier today  The patient was advised the importance of maintaining medication and not using illegal substances with these.  Here for refills and follow up  The patient was educated that we can provide 3 monthly scripts for their medication, it is their responsibility to follow the instructions.  Side effects or complications from medications: No side effects or complication it does help minimize the pain makes it more tolerable to be able to function  Patient is aware that pain medications are meant to minimize the severity of the pain to allow their pain levels to improve to allow for better function. They are aware of that pain medications cannot totally remove their pain.  Due for UDT ( at least once per year) : We do this on a yearly basis not due today drug registry was checked      He has a history of Hypertension ad takes Norvasc 5 mg one day, Ramipril 10 mg one bid  Patient for blood pressure check up.  The patient does have hypertension.  The patient is on medication.  Patient relates compliance with meds. Todays BP reviewed with the patient. Patient denies issues with medication. Patient relates reasonable diet. Patient tries to minimize salt. Patient aware of BP goals.   History of Jerrye Bushy and takes pantoprazole 40 mg, and famotidine 10 mg Qhs. Patient does have ongoing trouble with reflux.  Takes medication on a regular basis.  Tries to minimize foods as best they can.  They understand the importance of dietary compliance.  May also try to avoid eating a large meal  close to bedtime.  Patient denies any dysphagia denies hematochezia.  States medicine does a good job keeping the problem under good control.  Without the medication may certainly have issues.They desire to continue taking their medication.  He takes Pravastatin 80 mg one per day for Hyperlipidemia  Patient here for follow-up regarding cholesterol.  The patient does have hyperlipidemia.  Patient does try to maintain a reasonable diet.  Patient does take the medication on a regular basis.  Denies missing a dose.  The patient denies any obvious side effects.  Prior blood work results reviewed with the patient.  The patient is aware of his cholesterol goals and the need to keep it under good control to lessen the risk of disease.  Patient states he fell off a ladder,injuried knees.  He states last week he stepped out of his bobcat and thinks he tore something.  He eats healthy, and he exercises on the farm.   He sees Dr. Jeani Sow for his Knees next appt 05/31/2018.He see's Dr Corbin Ade and recently had endoscopy.  Review of Systems  Constitutional: Negative for activity change.  HENT: Negative for congestion and rhinorrhea.   Respiratory: Negative for cough and shortness of breath.   Cardiovascular: Negative for chest pain.  Gastrointestinal: Negative for abdominal pain, diarrhea, nausea and vomiting.  Genitourinary: Negative for dysuria and hematuria.  Musculoskeletal: Positive for arthralgias and back pain.  Neurological: Negative for weakness and headaches.  Psychiatric/Behavioral: Negative for behavioral problems and confusion.       Objective:   Physical Exam Vitals signs reviewed.  Constitutional:      General: He is not in acute distress. HENT:     Head: Normocephalic and atraumatic.  Eyes:     General:        Right eye: No discharge.        Left eye: No discharge.  Neck:     Trachea: No tracheal deviation.  Cardiovascular:     Rate and Rhythm: Normal rate and regular rhythm.      Heart sounds: Normal heart sounds. No murmur.  Pulmonary:     Effort: Pulmonary effort is normal. No respiratory distress.     Breath sounds: Normal breath sounds.  Lymphadenopathy:     Cervical: No cervical adenopathy.  Skin:    General: Skin is warm and dry.  Neurological:     Mental Status: He is alert.     Coordination: Coordination normal.  Psychiatric:        Behavior: Behavior normal.           Assessment & Plan:  HTN- Patient was seen today as part of a visit regarding hypertension. The importance of healthy diet and regular physical activity was discussed. The importance of compliance with medications discussed.  Ideal goal is to keep blood pressure low elevated levels certainly below 301/60 when possible.  The patient was counseled that keeping blood pressure under control lessen his risk of complications.  The importance of regular follow-ups was discussed with the patient.  Low-salt diet such as DASH recommended.  Regular physical activity was recommended as well.  Patient was advised to keep regular follow-ups.  Aortic atherosclerosis previous scan showed this best approach is to keep cholesterol blood pressure under good control  Atrial fib not in atrial fib currently but gets this frequently is on Pradaxa tolerating well continue this  The patient was seen today as part of an evaluation regarding hyperlipidemia.  Recent lab work has been reviewed with the patient as well as the goals for good cholesterol care.  In addition to this medications have been discussed the importance of compliance with diet and medications discussed as well.  Finally the patient is aware that poor control of cholesterol, noncompliance can dramatically increase the risk of complications. The patient will keep regular office visits and the patient does agreed to periodic lab work.  Chronic pain and discomfort The patient was seen in followup for chronic pain. A review over at their  current pain status was discussed. Drug registry was checked. Prescriptions were given. Discussion was held regarding the importance of compliance with medication as well as pain medication contract.  Time for questions regarding pain management plan occurred. Importance of regular followup visits was discussed. Patient was informed that medication may cause drowsiness and should not be combined  with other medications/alcohol or street drugs. Patient was cautioned that medication could cause drowsiness. If the patient feels medication is causing altered alertness then do not drive or operate dangerous equipment.  Drug registry checked 3 prescription sent in follow-up 3 months Previous labs reviewed no additional labs today 25 minutes was spent with the patient.  This statement verifies that 25 minutes was indeed spent with the patient.  More than 50% of this visit-total duration of the visit-was spent in counseling and coordination of care. The issues that the patient came in for today as reflected in the diagnosis (s) please refer to  documentation for further details.

## 2018-05-27 ENCOUNTER — Telehealth: Payer: Self-pay | Admitting: Family Medicine

## 2018-05-27 NOTE — Telephone Encounter (Signed)
Fax from pharmacy stating that a PA needs to be done to cover Pradaxa 150 mg capsule or change to a different medication. Fax states that Pradaxa is not on formulary. I have placed the fax in your office. Please advise. Thank you

## 2018-05-29 NOTE — Telephone Encounter (Signed)
It would be helpful to know what other medications is covered under his formulary other options would include Xarelto or Eliquis thank you  Please let me know which ones are covered

## 2018-05-30 NOTE — Telephone Encounter (Signed)
Pharmacy stated that eliquis is covered by patient's insurance.

## 2018-05-31 ENCOUNTER — Ambulatory Visit (INDEPENDENT_AMBULATORY_CARE_PROVIDER_SITE_OTHER): Payer: PPO | Admitting: Orthopaedic Surgery

## 2018-05-31 ENCOUNTER — Encounter (INDEPENDENT_AMBULATORY_CARE_PROVIDER_SITE_OTHER): Payer: Self-pay | Admitting: Orthopaedic Surgery

## 2018-05-31 ENCOUNTER — Ambulatory Visit (INDEPENDENT_AMBULATORY_CARE_PROVIDER_SITE_OTHER): Payer: PPO

## 2018-05-31 DIAGNOSIS — M25512 Pain in left shoulder: Secondary | ICD-10-CM

## 2018-05-31 DIAGNOSIS — M25561 Pain in right knee: Secondary | ICD-10-CM | POA: Diagnosis not present

## 2018-05-31 DIAGNOSIS — G8929 Other chronic pain: Secondary | ICD-10-CM

## 2018-05-31 MED ORDER — BUPIVACAINE HCL 0.5 % IJ SOLN
3.0000 mL | INTRAMUSCULAR | Status: AC | PRN
  Administered 2018-05-31: 3 mL via INTRA_ARTICULAR

## 2018-05-31 MED ORDER — LIDOCAINE HCL 1 % IJ SOLN
3.0000 mL | INTRAMUSCULAR | Status: AC | PRN
  Administered 2018-05-31: 3 mL

## 2018-05-31 MED ORDER — METHYLPREDNISOLONE ACETATE 40 MG/ML IJ SUSP
40.0000 mg | INTRAMUSCULAR | Status: AC | PRN
  Administered 2018-05-31: 40 mg via INTRA_ARTICULAR

## 2018-05-31 NOTE — Progress Notes (Signed)
Office Visit Note   Patient: [name]           Date of Birth: 11/16/1940           MRN: [id number] Visit Date: 05/31/2018              Requested by: Kathyrn Drown, MD [address], [place] [id number] PCP: Kathyrn Drown, MD   Assessment & Plan: Visit Diagnoses:  1. Right knee pain, unspecified chronicity   2. Chronic left shoulder pain     Plan: Impression is continued left shoulder pain and right knee probable degenerative medial meniscal tear.  For the left shoulder we repeated the subacromial injection today.  For the right knee patient feels that this is getting better and will continue to treat this with rest and some home exercises.  He will let us know if he continues to have problems at which point we would likely obtain MRI for those problems.  Follow-Up Instructions: Return if symptoms worsen or fail to improve.   Orders:  Orders Placed This Encounter  Procedures  . XR KNEE 3 VIEW RIGHT   No orders of the defined types were placed in this encounter.     Procedures: Large Joint Inj: L subacromial bursa on 05/31/2018 7:36 PM Indications: pain Details: 22 G needle  Arthrogram: No  Medications: 3 mL lidocaine 1 %; 3 mL bupivacaine 0.5 %; 40 mg methylPREDNISolone acetate 40 MG/ML Outcome: tolerated well, no immediate complications Patient was prepped and draped in the usual sterile fashion.       Clinical Data: No additional findings.   Subjective: Chief Complaint  Patient presents with  . Right Knee - Injury, Pain, Edema    Mr. Divita is a very pleasant 78 year old gentleman who comes in with 3 to 4-week history of right knee pain worse on the medial side.  He is also complaining of left shoulder pain that we have injected in the past with relief.  He states that the right knee is actually getting better but he was given a symptoms of popping catching that was worse with walking.  He felt like he may have twisted his right knee.   In terms of her left shoulder this continues to bother him with use and elevation of the arm.  Denies any numbness and tingling.   Review of Systems  Constitutional: Negative.   All other systems reviewed and are negative.    Objective: Vital Signs: There were no vitals taken for this visit.  Physical Exam Vitals signs and nursing note reviewed.  Constitutional:      Appearance: He is well-developed.  Pulmonary:     Effort: Pulmonary effort is normal.  Abdominal:     Palpations: Abdomen is soft.  Skin:    General: Skin is warm.  Neurological:     Mental Status: He is alert and oriented to person, place, and time.  Psychiatric:        Behavior: Behavior normal.        Thought Content: Thought content normal.        Judgment: Judgment normal.     Ortho Exam Left shoulder exam shows positive impingement and pain with empty can testing.  Overall strength is normal to manual muscle testing. Right knee exam shows a small joint effusion.  Medial joint line tenderness.  Negative [place]. Specialty Comments:  No specialty comments available.  Imaging: Xr Knee 3 View Right  Result Date: 05/31/2018 Mild to  moderate medial joint space narrowing.    PMFS History: Patient Active Problem List   Diagnosis Date Noted  . Melena 03/28/2018  . Closed fracture of lateral portion of left tibial plateau 03/09/2018  . Aortic atherosclerosis ([place]) 11/05/2016  . History of colonic polyps 06/16/2016  . Pseudoarthrosis of lumbar spine 04/24/2014  . Hx of adenomatous colonic polyps 11/22/2013  . Prediabetes 10/12/2013  . Fatigue 12/21/2012  . Snoring 12/21/2012  . Chronic back pain greater than 3 months duration 10/31/2012  . Sinusitis 02/22/2012  . GERD (gastroesophageal reflux disease) 02/22/2012  . Hyperlipidemia 01/21/2012  . HYPERTENSION, BENIGN 12/06/2009  . ATRIAL FIBRILLATION, PAROXYSMAL 12/06/2009   Past Medical History:  Diagnosis Date  . Arthritis   . Cancer ([place])     skin  . Carpal tunnel syndrome   . Cataract    left eye  . Chronic back pain    multiple back surgeries  . Complication of anesthesia    pt states woke up during hemorroid surgery  . Constipation    Metamucil every mornind  . DVT (deep venous thrombosis) ([place]) 03/2007   left leg   . GERD (gastroesophageal reflux disease)    takes Protonix daily  . Glaucoma   . History of colon polyps   . History of hyperglycemia   . History of MRSA infection 1992  . HTN (hypertension)    takes  Ramipril daily  . Hyperlipidemia    takes Pravastatin 3 times a week  . Joint pain   . Joint swelling   . Nocturia   . Paroxysmal atrial fibrillation (HCC)    takes Pradaxa daily, Chads2vasc score 2(07/11/14)  . Pneumonia    many yrs ago  . Pulmonary embolism ([place]) 2008  . Urinary frequency   . Urinary urgency     Family History  Problem Relation Age of Onset  . Hypertension Mother   . Transient ischemic attack Mother   . Heart disease Father   . Diabetes Brother   . Healthy Daughter   . Colon cancer Sister   . Hypertension Unknown   . Heart disease Unknown     Past Surgical History:  Procedure Laterality Date  . BIOPSY  04/01/2018   Procedure: BIOPSY;  Surgeon: Rogene Houston, MD;  [facility];  Service: Endoscopy;;  gastric  . CARDIAC CATHETERIZATION  2008  . CARPAL TUNNEL RELEASE Right   . cataract surgery Right   . cervical and lumbar surgery    . COLONOSCOPY  04/24/2011   Procedure: COLONOSCOPY;  Surgeon: Rogene Houston, MD;  [facility];  Service: Endoscopy;  Laterality: N/A;  1200  . COLONOSCOPY N/A 08/13/2016   Procedure: COLONOSCOPY;  Surgeon: Rogene Houston, MD;  [facility];  Service: Endoscopy;  Laterality: N/A;  1200  . ESOPHAGOGASTRODUODENOSCOPY  03/10/2012   Procedure: ESOPHAGOGASTRODUODENOSCOPY (EGD);  Surgeon: Rogene Houston, MD;  [facility];  Service: Endoscopy;  Laterality: N/A;  325  . ESOPHAGOGASTRODUODENOSCOPY  (EGD) WITH PROPOFOL N/A 04/01/2018   Procedure: ESOPHAGOGASTRODUODENOSCOPY (EGD) WITH PROPOFOL;  Surgeon: Rogene Houston, MD;  [facility];  Service: Endoscopy;  Laterality: N/A;  . HARDWARE REMOVAL N/A 04/24/2014   Procedure: REMOVAL OF SACRAL INSTRUMENTATION WITH [place];  Surgeon: Kristeen Miss, MD;  Location: [facility];  Service: Neurosurgery;  Laterality: N/A;  . HEMORRHOIDECTOMY WITH HEMORRHOID BANDING    . KNEE ARTHROSCOPY Right   . KNEE SURGERY     left  .  LASIK    . POLYPECTOMY  08/13/2016   Procedure: POLYPECTOMY;  Surgeon: Rogene Houston, MD;  [facility];  Service: Endoscopy;;  colon  . PVI  01/24/10   afib ablation   Social History   Occupational History  . Occupation: retired  Tobacco Use  . Smoking status: Former Smoker    Packs/day: 0.25    Years: 3.00    Pack years: 0.75    Types: Cigarettes    Last attempt to quit: 03/30/1969    Years since quitting: 49.2  . Smokeless tobacco: Never Used  Substance and Sexual Activity  . Alcohol use: Yes    Alcohol/week: 7.0 standard drinks    Types: 7 Glasses of wine per week    Comment: wine nightly  . Drug use: No  . Sexual activity: Yes

## 2018-06-01 ENCOUNTER — Other Ambulatory Visit: Payer: Self-pay | Admitting: Family Medicine

## 2018-06-01 MED ORDER — APIXABAN 5 MG PO TABS: ORAL_TABLET | ORAL | 1 refills | Status: DC

## 2018-06-01 NOTE — Telephone Encounter (Signed)
According to his insurance his Pradaxa will no longer be covered  According to the message Eliquis is covered  It is okay for the patient to finish up the Pradaxa he has But the next prescription to be sent and should be Eliquis 5 mg twice daily Talk with the patient make sure he understands that he is being switched over to Eliquis This medication does do a good job of preventing strokes It is very similar to what he is currently on We can send them 90-day supply with a refill In the note to the pharmacy please indicate that he is to stop the Pradaxa and start the Eliquis It is important for the patient to know and voiced understanding that he should not be taking both medicines Essentially when he finishes the Pradaxa he will start Eliquis twice daily If there is any questions regarding this please clarify with me

## 2018-06-01 NOTE — Telephone Encounter (Signed)
Pt wife returned call. Pt had to be called back due to phone had hung up for unknown reason. Nurse called wife back. Wife not on DPR but patient was beside of her and nurse received verbal consent. Informed wife to sign DPR in near future to be able to release information. Pt wife verbalized understanding. Informed patient wife of change of medication from Pradaxa to Eliquis. Informed wife for patient to take 2 eliquis twice daily after pradaxa is done. Made sure that wife verbally understood; wife verbally told me the instructions back. Eliquis was sent in with message to start when Pradaxa is done. Also informed patient wife that Pradaxa could be finished up but afterwards start Eliquis.

## 2018-06-01 NOTE — Telephone Encounter (Signed)
Contacted patient. Started speaking with patient and he stated that I needed to speak with wife. Wife came to phone. Wife states they were driving and were unable to hear me. Pt wife states she will call the office back when they get home so they can understand what nurse is saying.

## 2018-06-14 ENCOUNTER — Other Ambulatory Visit (INDEPENDENT_AMBULATORY_CARE_PROVIDER_SITE_OTHER): Payer: Self-pay

## 2018-06-14 ENCOUNTER — Telehealth (INDEPENDENT_AMBULATORY_CARE_PROVIDER_SITE_OTHER): Payer: Self-pay | Admitting: Orthopaedic Surgery

## 2018-06-14 DIAGNOSIS — G8929 Other chronic pain: Secondary | ICD-10-CM

## 2018-06-14 DIAGNOSIS — M25561 Pain in right knee: Principal | ICD-10-CM

## 2018-06-14 NOTE — Telephone Encounter (Signed)
Ok that's fine.  Thanks.

## 2018-06-14 NOTE — Telephone Encounter (Signed)
MRI RIGHT KNEE- WOULD PREFER MRI IN [place].

## 2018-06-14 NOTE — Telephone Encounter (Signed)
Just to confirm that it's MRI of the shoulder?

## 2018-06-14 NOTE — Telephone Encounter (Signed)
Patient called asked if he can be set up for an MRI. Patient said the brace is helping his knee. The number to contact patient is [phone]

## 2018-06-14 NOTE — Telephone Encounter (Signed)
Please advise 

## 2018-06-14 NOTE — Telephone Encounter (Signed)
Order made

## 2018-06-20 ENCOUNTER — Telehealth (INDEPENDENT_AMBULATORY_CARE_PROVIDER_SITE_OTHER): Payer: Self-pay | Admitting: *Deleted

## 2018-06-20 NOTE — Telephone Encounter (Signed)
Pt called and left vm stating he has not heard about getting scheduled for his MRI, I tried calling pt vm is full. Order has been sent to [facility] imaging and they should be giving him a call to schedule sometime this week.

## 2018-06-23 NOTE — Telephone Encounter (Signed)
Pt called back and states wants to go to [facility] for imaging because it is closer.

## 2018-06-23 NOTE — Telephone Encounter (Signed)
Order has been changed to [facility]

## 2018-06-24 ENCOUNTER — Other Ambulatory Visit: Payer: Self-pay | Admitting: Family Medicine

## 2018-06-30 ENCOUNTER — Other Ambulatory Visit: Payer: Self-pay

## 2018-06-30 NOTE — Telephone Encounter (Signed)
Tried to contact patient. Pt voicemail is full; unable to leave message 

## 2018-06-30 NOTE — Telephone Encounter (Signed)
My understanding was he was switching from Pradaxa over to Eliquis due to switching meds Please verify that with the patient If that is so then he may have 6 months refill on Eliquis 5 mg twice daily And discontinue Pradaxa from his epic medication list  If there is any confusion please let me know obviously the patient should not be on both Pradaxa and Eliquis

## 2018-07-01 ENCOUNTER — Ambulatory Visit (HOSPITAL_COMMUNITY): Payer: PPO

## 2018-07-01 ENCOUNTER — Encounter: Payer: Self-pay | Admitting: Family Medicine

## 2018-07-01 ENCOUNTER — Ambulatory Visit (INDEPENDENT_AMBULATORY_CARE_PROVIDER_SITE_OTHER): Payer: PPO | Admitting: Family Medicine

## 2018-07-01 VITALS — Ht 66.0 in | Wt 195.0 lb

## 2018-07-01 DIAGNOSIS — M79622 Pain in left upper arm: Secondary | ICD-10-CM

## 2018-07-01 DIAGNOSIS — S46212A Strain of muscle, fascia and tendon of other parts of biceps, left arm, initial encounter: Secondary | ICD-10-CM

## 2018-07-01 MED ORDER — PREDNISONE 20 MG PO TABS: ORAL_TABLET | ORAL | 0 refills | Status: DC

## 2018-07-01 NOTE — Progress Notes (Signed)
   Subjective:    Patient ID: [name], male    DOB: October 19, 1940, 78 y.o.   MRN: [id number]  HPI  Patient states he hurt his left arm today. Patient states he went to pull a palate that was stuck on bobcat and he felt the pop and burn in his arm when he pulled and now knotted up. He was doing some work outside felt a pop in the arm and felt a bulge in the arm he has had this before on the right side and now it is on the left side this does concern him but he denies any other particular troubles. Review of Systems  Constitutional: Negative for activity change, appetite change and fatigue.  HENT: Negative for congestion and rhinorrhea.   Respiratory: Negative for cough and shortness of breath.   Cardiovascular: Negative for chest pain and leg swelling.  Gastrointestinal: Negative for abdominal pain, nausea and vomiting.  Neurological: Negative for dizziness and headaches.  Psychiatric/Behavioral: Negative for agitation and behavioral problems.       Objective:   Physical Exam Vitals signs reviewed.  Cardiovascular:     Rate and Rhythm: Normal rate. Rhythm irregular.     Heart sounds: Normal heart sounds. No murmur.  Pulmonary:     Effort: Pulmonary effort is normal.     Breath sounds: Normal breath sounds.  Lymphadenopathy:     Cervical: No cervical adenopathy.  Neurological:     Mental Status: He is alert.  Psychiatric:        Behavior: Behavior normal.    Has classic appearance of a bicep tendon rupture.  Unfortunately not much 1 can do for this he has had this on the other arm and orthopedics told him he was too old to get it fixed       Assessment & Plan:  Patient has bicep tendon rupture-unfortunately not much can be done for this.  May use short course of prednisone to help with the discomfort Tylenol pain medicine otherwise No anti-inflammatories Warning signs regarding progressive troubles discussed No other intervention necessary currently.

## 2018-07-11 ENCOUNTER — Ambulatory Visit (HOSPITAL_COMMUNITY)
Admission: RE | Admit: 2018-07-11 | Discharge: 2018-07-11 | Disposition: A | Discharge status: Home / Self Care | Payer: PPO | Source: Ambulatory Visit | Attending: Orthopaedic Surgery | Admitting: Orthopaedic Surgery

## 2018-07-11 DIAGNOSIS — G8929 Other chronic pain: Secondary | ICD-10-CM | POA: Diagnosis not present

## 2018-07-11 DIAGNOSIS — M25561 Pain in right knee: Secondary | ICD-10-CM | POA: Diagnosis not present

## 2018-07-11 DIAGNOSIS — M1711 Unilateral primary osteoarthritis, right knee: Secondary | ICD-10-CM | POA: Diagnosis not present

## 2018-07-12 NOTE — Progress Notes (Signed)
Needs f/u appt 

## 2018-07-13 ENCOUNTER — Encounter (INDEPENDENT_AMBULATORY_CARE_PROVIDER_SITE_OTHER): Payer: Self-pay | Admitting: Orthopaedic Surgery

## 2018-07-13 ENCOUNTER — Ambulatory Visit (INDEPENDENT_AMBULATORY_CARE_PROVIDER_SITE_OTHER): Payer: PPO | Admitting: Orthopaedic Surgery

## 2018-07-13 DIAGNOSIS — S83241D Other tear of medial meniscus, current injury, right knee, subsequent encounter: Secondary | ICD-10-CM | POA: Diagnosis not present

## 2018-07-13 NOTE — Progress Notes (Signed)
Office Visit Note   Patient: [name]           Date of Birth: [birth year]           MRN: [id number] Visit Date: 07/13/2018              Requested by: Kathyrn Drown, MD [place], [place] [address] PCP: Kathyrn Drown, MD   Assessment & Plan: Visit Diagnoses:  1. Acute medial meniscus tear, right, subsequent encounter     Plan: Impression is symptomatic medial meniscal tear as well as degenerative joint disease.  Overall his symptoms are consistent with the meniscus tear rather than the degenerative arthritis.  We discussed cortisone injection versus knee arthroscopy and partial medial meniscectomy.  After discussion of risks and benefits patient would like to move forward with arthroscopic partial medial meniscectomy.  He understands that this may temporarily make his arthritis symptoms worse.  Questions encouraged and answered.  We will obtain preoperative medical clearance from his PCP Dr. Wolfgang Phoenix because he has a history of DVT and PE and he is on Eliquis.  Follow-Up Instructions: Return if symptoms worsen or fail to improve.   Orders:  No orders of the defined types were placed in this encounter.  No orders of the defined types were placed in this encounter.     Procedures: No procedures performed   Clinical Data: No additional findings.   Subjective: Chief Complaint  Patient presents with  . Right Knee - Follow-up    Mr. Beckmann is following up today for his right knee MRI.  He continues to endorse significant mechanical symptoms.  He states that he is never had any trouble with chronic arthritic pain.  He feels like the right knee has something stuck inside and it causes him to give way.   Review of Systems   Objective: Vital Signs: There were no vitals taken for this visit.  Physical Exam  Ortho Exam Right knee exam shows a joint effusion.  Medial joint line tenderness.  Positive [place]. Specialty Comments:  No specialty  comments available.  Imaging: No results found.   PMFS History: Patient Active Problem List   Diagnosis Date Noted  . Acute medial meniscus tear, right, subsequent encounter 07/13/2018  . Melena 03/28/2018  . Closed fracture of lateral portion of left tibial plateau 03/09/2018  . Aortic atherosclerosis ([place]) 11/05/2016  . History of colonic polyps 06/16/2016  . Pseudoarthrosis of lumbar spine 04/24/2014  . Hx of adenomatous colonic polyps 11/22/2013  . Prediabetes 10/12/2013  . Fatigue 12/21/2012  . Snoring 12/21/2012  . Chronic back pain greater than 3 months duration 10/31/2012  . Sinusitis 02/22/2012  . GERD (gastroesophageal reflux disease) 02/22/2012  . Hyperlipidemia 01/21/2012  . HYPERTENSION, BENIGN 12/06/2009  . ATRIAL FIBRILLATION, PAROXYSMAL 12/06/2009   Past Medical History:  Diagnosis Date  . Arthritis   . Cancer ([place])    skin  . Carpal tunnel syndrome   . Cataract    left eye  . Chronic back pain    multiple back surgeries  . Complication of anesthesia    pt states woke up during hemorroid surgery  . Constipation    Metamucil every mornind  . DVT (deep venous thrombosis) ([place]) 03/2007   left leg   . GERD (gastroesophageal reflux disease)    takes Protonix daily  . Glaucoma   . History of colon polyps   . History of hyperglycemia   . History of MRSA infection 1992  .  HTN (hypertension)    takes  Ramipril daily  . Hyperlipidemia    takes Pravastatin 3 times a week  . Joint pain   . Joint swelling   . Nocturia   . Paroxysmal atrial fibrillation (HCC)    takes Pradaxa daily, Chads2vasc score 2(07/11/14)  . Pneumonia    many yrs ago  . Pulmonary embolism ([place]) 2008  . Urinary frequency   . Urinary urgency     Family History  Problem Relation Age of Onset  . Hypertension Mother   . Transient ischemic attack Mother   . Heart disease Father   . Diabetes Brother   . Healthy Daughter   . Colon cancer Sister   . Hypertension Unknown   . Heart  disease Unknown     Past Surgical History:  Procedure Laterality Date  . BIOPSY  04/01/2018   Procedure: BIOPSY;  Surgeon: Rogene Houston, MD;  [facility];  Service: Endoscopy;;  gastric  . CARDIAC CATHETERIZATION  2008  . CARPAL TUNNEL RELEASE Right   . cataract surgery Right   . cervical and lumbar surgery    . COLONOSCOPY  04/24/2011   Procedure: COLONOSCOPY;  Surgeon: Rogene Houston, MD;  [facility];  Service: Endoscopy;  Laterality: N/A;  1200  . COLONOSCOPY N/A 08/13/2016   Procedure: COLONOSCOPY;  Surgeon: Rogene Houston, MD;  [facility];  Service: Endoscopy;  Laterality: N/A;  1200  . ESOPHAGOGASTRODUODENOSCOPY  03/10/2012   Procedure: ESOPHAGOGASTRODUODENOSCOPY (EGD);  Surgeon: Rogene Houston, MD;  [facility];  Service: Endoscopy;  Laterality: N/A;  325  . ESOPHAGOGASTRODUODENOSCOPY (EGD) WITH PROPOFOL N/A 04/01/2018   Procedure: ESOPHAGOGASTRODUODENOSCOPY (EGD) WITH PROPOFOL;  Surgeon: Rogene Houston, MD;  [facility];  Service: Endoscopy;  Laterality: N/A;  . HARDWARE REMOVAL N/A 04/24/2014   Procedure: REMOVAL OF SACRAL INSTRUMENTATION WITH [place];  Surgeon: Kristeen Miss, MD;  Location: [facility];  Service: Neurosurgery;  Laterality: N/A;  . HEMORRHOIDECTOMY WITH HEMORRHOID BANDING    . KNEE ARTHROSCOPY Right   . KNEE SURGERY     left  . LASIK    . POLYPECTOMY  08/13/2016   Procedure: POLYPECTOMY;  Surgeon: Rogene Houston, MD;  [facility];  Service: Endoscopy;;  colon  . PVI  01/24/10   afib ablation   Social History   Occupational History  . Occupation: retired  Tobacco Use  . Smoking status: Former Smoker    Packs/day: 0.25    Years: 3.00    Pack years: 0.75    Types: Cigarettes    Last attempt to quit: 03/30/1969    Years since quitting: 49.3  . Smokeless tobacco: Never Used  Substance and Sexual Activity  . Alcohol use: Yes    Alcohol/week: 7.0 standard drinks    Types: 7  Glasses of wine per week    Comment: wine nightly  . Drug use: No  . Sexual activity: Yes

## 2018-07-14 DIAGNOSIS — M549 Dorsalgia, unspecified: Secondary | ICD-10-CM | POA: Diagnosis not present

## 2018-07-14 DIAGNOSIS — Z981 Arthrodesis status: Secondary | ICD-10-CM | POA: Diagnosis not present

## 2018-07-24 ENCOUNTER — Telehealth: Payer: Self-pay | Admitting: Family Medicine

## 2018-07-24 NOTE — Telephone Encounter (Signed)
Nurses Please talk with patient We received a request from his orthopedist Dr.Xu  They are planning to do a knee arthroscopy. They needed guidance regarding his Eliquis  Eliquis is typically stopped 2 days before the procedure.  And it is restarted 1 day after the procedure.  #1 please find out from patient when is his procedure 2.  Please make sure patient is aware that he would have to stop his Eliquis and make sure that he is okay with that #3 we can send the preoperative consultation to his specialist once he confirms he is aware of the above  Please let me know

## 2018-07-25 NOTE — Telephone Encounter (Signed)
Contacted patient. Pt states he is unaware of when the surgery is. Pt states he is OK with stopping the Eliquis 2 days before procedure and starting back one day after procedure. Contacted [facility] to see if they knew when surgery was scheduled, receptionist states that she does she in chart where surgery has been ordered. Transferred to Dr.Xu surgery scheduler to see what was going on with surgery, left message for her to return call

## 2018-07-27 ENCOUNTER — Other Ambulatory Visit: Payer: Self-pay

## 2018-07-27 ENCOUNTER — Encounter (HOSPITAL_BASED_OUTPATIENT_CLINIC_OR_DEPARTMENT_OTHER): Payer: Self-pay | Admitting: *Deleted

## 2018-07-27 ENCOUNTER — Telehealth (INDEPENDENT_AMBULATORY_CARE_PROVIDER_SITE_OTHER): Payer: Self-pay | Admitting: Orthopaedic Surgery

## 2018-07-27 NOTE — Telephone Encounter (Signed)
Pt called asking about when he is going to have surgery on his knee.

## 2018-07-27 NOTE — Telephone Encounter (Signed)
Spoke with patient and scheduled surgery. °

## 2018-07-31 NOTE — Telephone Encounter (Signed)
Medical clearance was given for this patient to stop the Eliquis 2 days beforehand may resume 1 day later if no bleeding issues this was filled out on [facility] and will be faxed to them

## 2018-08-01 ENCOUNTER — Encounter (HOSPITAL_BASED_OUTPATIENT_CLINIC_OR_DEPARTMENT_OTHER): Payer: PPO

## 2018-08-01 ENCOUNTER — Encounter (HOSPITAL_BASED_OUTPATIENT_CLINIC_OR_DEPARTMENT_OTHER): Payer: Self-pay

## 2018-08-01 DIAGNOSIS — M94261 Chondromalacia, right knee: Secondary | ICD-10-CM | POA: Diagnosis not present

## 2018-08-01 DIAGNOSIS — M1990 Unspecified osteoarthritis, unspecified site: Secondary | ICD-10-CM | POA: Diagnosis not present

## 2018-08-01 DIAGNOSIS — Z79899 Other long term (current) drug therapy: Secondary | ICD-10-CM | POA: Diagnosis not present

## 2018-08-01 DIAGNOSIS — S83241A Other tear of medial meniscus, current injury, right knee, initial encounter: Secondary | ICD-10-CM | POA: Diagnosis not present

## 2018-08-01 DIAGNOSIS — Z888 Allergy status to other drugs, medicaments and biological substances status: Secondary | ICD-10-CM | POA: Diagnosis not present

## 2018-08-01 DIAGNOSIS — K219 Gastro-esophageal reflux disease without esophagitis: Secondary | ICD-10-CM | POA: Diagnosis not present

## 2018-08-01 DIAGNOSIS — Z882 Allergy status to sulfonamides status: Secondary | ICD-10-CM | POA: Diagnosis not present

## 2018-08-01 DIAGNOSIS — S83281A Other tear of lateral meniscus, current injury, right knee, initial encounter: Secondary | ICD-10-CM | POA: Diagnosis not present

## 2018-08-01 DIAGNOSIS — M659 Synovitis and tenosynovitis, unspecified: Secondary | ICD-10-CM | POA: Diagnosis not present

## 2018-08-01 DIAGNOSIS — X58XXXA Exposure to other specified factors, initial encounter: Secondary | ICD-10-CM | POA: Diagnosis not present

## 2018-08-01 DIAGNOSIS — I480 Paroxysmal atrial fibrillation: Secondary | ICD-10-CM | POA: Diagnosis not present

## 2018-08-01 DIAGNOSIS — I10 Essential (primary) hypertension: Secondary | ICD-10-CM | POA: Diagnosis not present

## 2018-08-01 LAB — BASIC METABOLIC PANEL
Anion gap: 11 (ref 5–15)
BUN: 27 mg/dL — ABNORMAL HIGH (ref 8–23)
CO2: 26 mmol/L (ref 22–32)
Calcium: 9.6 mg/dL (ref 8.9–10.3)
Chloride: 101 mmol/L (ref 98–111)
Creatinine, Ser: 1.1 mg/dL (ref 0.61–1.24)
GFR calc Af Amer: >60 mL/min (ref >60)
GFR calc non Af Amer: >60 mL/min (ref >60)
Glucose, Bld: 125 mg/dL — ABNORMAL HIGH (ref 70–99)
Potassium: 4.3 mmol/L (ref 3.5–5.1)
Sodium: 138 mmol/L (ref 135–145)

## 2018-08-01 NOTE — Progress Notes (Addendum)
Reviewed labs and history with Dr. Roanna Banning , Glucose 125 and BUN 27 - ok for surgery.

## 2018-08-03 ENCOUNTER — Ambulatory Visit (HOSPITAL_BASED_OUTPATIENT_CLINIC_OR_DEPARTMENT_OTHER): Payer: PPO | Admitting: Anesthesiology

## 2018-08-03 ENCOUNTER — Ambulatory Visit (HOSPITAL_BASED_OUTPATIENT_CLINIC_OR_DEPARTMENT_OTHER)
Admission: RE | Admit: 2018-08-03 | Discharge: 2018-08-03 | Disposition: A | Discharge status: Home / Self Care | Payer: PPO | Attending: Orthopaedic Surgery | Admitting: Orthopaedic Surgery

## 2018-08-03 ENCOUNTER — Other Ambulatory Visit: Payer: Self-pay

## 2018-08-03 ENCOUNTER — Telehealth (INDEPENDENT_AMBULATORY_CARE_PROVIDER_SITE_OTHER): Payer: Self-pay

## 2018-08-03 ENCOUNTER — Encounter (HOSPITAL_BASED_OUTPATIENT_CLINIC_OR_DEPARTMENT_OTHER): Payer: Self-pay | Admitting: *Deleted

## 2018-08-03 ENCOUNTER — Encounter (HOSPITAL_BASED_OUTPATIENT_CLINIC_OR_DEPARTMENT_OTHER)
Admission: RE | Disposition: A | Discharge status: Home / Self Care | Payer: Self-pay | Source: Home / Self Care | Attending: Orthopaedic Surgery

## 2018-08-03 ENCOUNTER — Encounter: Payer: Self-pay | Admitting: Orthopaedic Surgery

## 2018-08-03 DIAGNOSIS — I480 Paroxysmal atrial fibrillation: Secondary | ICD-10-CM | POA: Insufficient documentation

## 2018-08-03 DIAGNOSIS — S83241A Other tear of medial meniscus, current injury, right knee, initial encounter: Secondary | ICD-10-CM | POA: Insufficient documentation

## 2018-08-03 DIAGNOSIS — M659 Synovitis and tenosynovitis, unspecified: Secondary | ICD-10-CM | POA: Diagnosis not present

## 2018-08-03 DIAGNOSIS — I10 Essential (primary) hypertension: Secondary | ICD-10-CM | POA: Diagnosis not present

## 2018-08-03 DIAGNOSIS — M1990 Unspecified osteoarthritis, unspecified site: Secondary | ICD-10-CM | POA: Insufficient documentation

## 2018-08-03 DIAGNOSIS — S83241D Other tear of medial meniscus, current injury, right knee, subsequent encounter: Secondary | ICD-10-CM

## 2018-08-03 DIAGNOSIS — M94261 Chondromalacia, right knee: Secondary | ICD-10-CM | POA: Insufficient documentation

## 2018-08-03 DIAGNOSIS — S83281A Other tear of lateral meniscus, current injury, right knee, initial encounter: Secondary | ICD-10-CM

## 2018-08-03 DIAGNOSIS — K219 Gastro-esophageal reflux disease without esophagitis: Secondary | ICD-10-CM | POA: Insufficient documentation

## 2018-08-03 DIAGNOSIS — Z888 Allergy status to other drugs, medicaments and biological substances status: Secondary | ICD-10-CM | POA: Insufficient documentation

## 2018-08-03 DIAGNOSIS — Z882 Allergy status to sulfonamides status: Secondary | ICD-10-CM | POA: Diagnosis not present

## 2018-08-03 DIAGNOSIS — X58XXXA Exposure to other specified factors, initial encounter: Secondary | ICD-10-CM | POA: Insufficient documentation

## 2018-08-03 DIAGNOSIS — Z79899 Other long term (current) drug therapy: Secondary | ICD-10-CM | POA: Insufficient documentation

## 2018-08-03 HISTORY — PX: KNEE ARTHROSCOPY WITH MEDIAL MENISECTOMY: SHX5651

## 2018-08-03 SURGERY — ARTHROSCOPY, KNEE, WITH MEDIAL MENISCECTOMY
Anesthesia: General | Site: Knee | Laterality: Right

## 2018-08-03 MED ORDER — ACETAMINOPHEN 10 MG/ML IV SOLN
INTRAVENOUS | Status: AC
  Filled 2018-08-03: qty 100

## 2018-08-03 MED ORDER — OXYCODONE HCL 5 MG/5ML PO SOLN: 5.0000 mg | Freq: Once | ORAL | Status: DC | PRN

## 2018-08-03 MED ORDER — LIDOCAINE 2% (20 MG/ML) 5 ML SYRINGE
INTRAMUSCULAR | Status: AC
  Filled 2018-08-03: qty 5

## 2018-08-03 MED ORDER — PROPOFOL 10 MG/ML IV BOLUS
INTRAVENOUS | Status: DC | PRN
  Administered 2018-08-03: 160 mg via INTRAVENOUS

## 2018-08-03 MED ORDER — CEFAZOLIN SODIUM-DEXTROSE 2-3 GM-%(50ML) IV SOLR
INTRAVENOUS | Status: DC | PRN
  Administered 2018-08-03: 2 g via INTRAVENOUS

## 2018-08-03 MED ORDER — HYDROCODONE-ACETAMINOPHEN 5-325 MG PO TABS: 1.0000 | ORAL_TABLET | Freq: Four times a day (QID) | ORAL | 0 refills | Status: DC | PRN

## 2018-08-03 MED ORDER — ACETAMINOPHEN 325 MG PO TABS: 325.0000 mg | ORAL_TABLET | Freq: Once | ORAL | Status: DC

## 2018-08-03 MED ORDER — PROPOFOL 10 MG/ML IV BOLUS
INTRAVENOUS | Status: AC
  Filled 2018-08-03: qty 20

## 2018-08-03 MED ORDER — FENTANYL CITRATE (PF) 100 MCG/2ML IJ SOLN
INTRAMUSCULAR | Status: AC
  Filled 2018-08-03: qty 2

## 2018-08-03 MED ORDER — ACETAMINOPHEN 160 MG/5ML PO SOLN: 325.0000 mg | Freq: Once | ORAL | Status: DC

## 2018-08-03 MED ORDER — SCOPOLAMINE 1 MG/3DAYS TD PT72: 1.0000 | MEDICATED_PATCH | Freq: Once | TRANSDERMAL | Status: DC | PRN

## 2018-08-03 MED ORDER — DEXAMETHASONE SODIUM PHOSPHATE 10 MG/ML IJ SOLN
INTRAMUSCULAR | Status: AC
  Filled 2018-08-03: qty 1

## 2018-08-03 MED ORDER — MEPERIDINE HCL 25 MG/ML IJ SOLN: 6.2500 mg | INTRAMUSCULAR | Status: DC | PRN

## 2018-08-03 MED ORDER — DEXAMETHASONE SODIUM PHOSPHATE 10 MG/ML IJ SOLN
INTRAMUSCULAR | Status: DC | PRN
  Administered 2018-08-03: 10 mg via INTRAVENOUS

## 2018-08-03 MED ORDER — CEFAZOLIN SODIUM-DEXTROSE 2-4 GM/100ML-% IV SOLN: 2.0000 g | INTRAVENOUS | Status: DC

## 2018-08-03 MED ORDER — OXYCODONE HCL 5 MG PO TABS: 5.0000 mg | ORAL_TABLET | Freq: Once | ORAL | Status: DC | PRN

## 2018-08-03 MED ORDER — ACETAMINOPHEN 10 MG/ML IV SOLN
1000.0000 mg | Freq: Once | INTRAVENOUS | Status: DC | PRN
  Administered 2018-08-03: 1000 mg via INTRAVENOUS

## 2018-08-03 MED ORDER — LACTATED RINGERS IV SOLN: INTRAVENOUS | Status: DC

## 2018-08-03 MED ORDER — LIDOCAINE HCL (CARDIAC) PF 100 MG/5ML IV SOSY
PREFILLED_SYRINGE | INTRAVENOUS | Status: DC | PRN
  Administered 2018-08-03: 100 mg via INTRAVENOUS

## 2018-08-03 MED ORDER — LACTATED RINGERS IV SOLN
INTRAVENOUS | Status: DC
  Administered 2018-08-03: 10:00:00 via INTRAVENOUS

## 2018-08-03 MED ORDER — CEFAZOLIN SODIUM-DEXTROSE 2-4 GM/100ML-% IV SOLN
INTRAVENOUS | Status: AC
  Filled 2018-08-03: qty 100

## 2018-08-03 MED ORDER — ONDANSETRON HCL 4 MG/2ML IJ SOLN
INTRAMUSCULAR | Status: AC
  Filled 2018-08-03: qty 2

## 2018-08-03 MED ORDER — ONDANSETRON HCL 4 MG PO TABS: 4.0000 mg | ORAL_TABLET | Freq: Three times a day (TID) | ORAL | 0 refills | Status: DC | PRN

## 2018-08-03 MED ORDER — CHLORHEXIDINE GLUCONATE 4 % EX LIQD: 60.0000 mL | Freq: Once | CUTANEOUS | Status: DC

## 2018-08-03 MED ORDER — PROMETHAZINE HCL 25 MG/ML IJ SOLN: 6.2500 mg | INTRAMUSCULAR | Status: DC | PRN

## 2018-08-03 MED ORDER — FENTANYL CITRATE (PF) 100 MCG/2ML IJ SOLN: 25.0000 ug | INTRAMUSCULAR | Status: DC | PRN

## 2018-08-03 MED ORDER — MIDAZOLAM HCL 2 MG/2ML IJ SOLN: 1.0000 mg | INTRAMUSCULAR | Status: DC | PRN

## 2018-08-03 MED ORDER — FENTANYL CITRATE (PF) 100 MCG/2ML IJ SOLN
50.0000 ug | INTRAMUSCULAR | Status: AC | PRN
  Administered 2018-08-03 (×4): 50 ug via INTRAVENOUS

## 2018-08-03 SURGICAL SUPPLY — 42 items
BANDAGE ACE 6X5 VEL STRL LF (GAUZE/BANDAGES/DRESSINGS) ×6 IMPLANT
BANDAGE ESMARK 6X9 LF (GAUZE/BANDAGES/DRESSINGS) IMPLANT
BLADE 4.2CUDA (BLADE) IMPLANT
BLADE CUDA GRT WHITE 3.5 (BLADE) IMPLANT
BLADE CUDA SHAVER 3.5 (BLADE) IMPLANT
BLADE CUTTER GATOR 3.5 (BLADE) IMPLANT
BLADE GREAT WHITE 4.2 (BLADE) IMPLANT
BLADE GREAT WHITE 4.2MM (BLADE)
BLADE LANZA CVD 15 DEG (BLADE) IMPLANT
BNDG CMPR 9X6 STRL LF SNTH (GAUZE/BANDAGES/DRESSINGS)
BNDG ESMARK 6X9 LF (GAUZE/BANDAGES/DRESSINGS)
COVER WAND RF STERILE (DRAPES) IMPLANT
CUFF TOURN SGL QUICK 34 (TOURNIQUET CUFF) ×3
CUFF TRNQT CYL 34X4.125X (TOURNIQUET CUFF) ×1 IMPLANT
DRAPE ARTHROSCOPY W/POUCH 90 (DRAPES) ×3 IMPLANT
DRAPE IMP U-DRAPE 54X76 (DRAPES) ×3 IMPLANT
DRAPE U-SHAPE 47X51 STRL (DRAPES) ×3 IMPLANT
DURAPREP 26ML APPLICATOR (WOUND CARE) ×3 IMPLANT
GAUZE SPONGE 4X4 12PLY STRL (GAUZE/BANDAGES/DRESSINGS) ×3 IMPLANT
GAUZE XEROFORM 1X8 LF (GAUZE/BANDAGES/DRESSINGS) ×3 IMPLANT
GLOVE BIOGEL PI IND STRL 7.0 (GLOVE) ×1 IMPLANT
GLOVE BIOGEL PI INDICATOR 7.0 (GLOVE) ×2
GLOVE ECLIPSE 7.0 STRL STRAW (GLOVE) ×3 IMPLANT
GLOVE SKINSENSE NS SZ7.5 (GLOVE) ×2
GLOVE SKINSENSE STRL SZ7.5 (GLOVE) ×1 IMPLANT
GLOVE SURG SYN 7.5  E (GLOVE) ×2
GLOVE SURG SYN 7.5 E (GLOVE) ×1 IMPLANT
GLOVE SURG SYN 7.5 PF PI (GLOVE) ×1 IMPLANT
GOWN STRL REIN XL XLG (GOWN DISPOSABLE) ×3 IMPLANT
GOWN STRL REUS W/ TWL LRG LVL3 (GOWN DISPOSABLE) ×1 IMPLANT
GOWN STRL REUS W/ TWL XL LVL3 (GOWN DISPOSABLE) ×1 IMPLANT
GOWN STRL REUS W/TWL LRG LVL3 (GOWN DISPOSABLE) ×3
GOWN STRL REUS W/TWL XL LVL3 (GOWN DISPOSABLE) ×3
KNEE WRAP E Z 3 GEL PACK (MISCELLANEOUS) ×3 IMPLANT
MANIFOLD NEPTUNE II (INSTRUMENTS) ×3 IMPLANT
PACK ARTHROSCOPY DSU (CUSTOM PROCEDURE TRAY) ×3 IMPLANT
PACK BASIN DAY SURGERY FS (CUSTOM PROCEDURE TRAY) ×3 IMPLANT
RESECTOR FULL RADIUS 4.2MM (BLADE) IMPLANT
SHAVER 4.2 MM LANZA 9391A (BLADE) ×3 IMPLANT
SUT ETHILON 3 0 PS 1 (SUTURE) ×3 IMPLANT
TOWEL GREEN STERILE FF (TOWEL DISPOSABLE) ×3 IMPLANT
TUBING ARTHRO INFLOW-ONLY STRL (TUBING) ×3 IMPLANT

## 2018-08-03 NOTE — Op Note (Signed)
Surgery Date: 08/03/2018  Surgeon(s): Leandrew Koyanagi, MD  ASSIST: Madalyn Rob, [place]; necessary for the timely completion of procedure and due to complexity of procedure.  ANESTHESIA:  general  FLUIDS: Per anesthesia record.   ESTIMATED BLOOD LOSS: minimal  PREOPERATIVE DIAGNOSES:  1. Right knee medial and lateral meniscus tears 2. Right knee synovitis 3. Right knee chondromalacia  POSTOPERATIVE DIAGNOSES:  same  PROCEDURES PERFORMED:  1. Right knee arthroscopy with major synovectomy 2. Right knee arthroscopy with arthroscopic partial medial and lateral meniscectomy 3. Right knee arthroscopy with arthroscopic chondroplasty medial femoral condyle and femoral trochlea.  DESCRIPTION OF PROCEDURE: Mr. Pfost is a 78 y.o.-year-old male with right knee medial meniscus tear. Plans are to proceed with partial medial meniscectomy and diagnostic arthroscopy with debridement as indicated. Full discussion held regarding risks benefits alternatives and complications related surgical intervention. Conservative care options reviewed. All questions answered.  The patient was identified in the preoperative holding area and the operative extremity was marked. The patient was brought to the operating room and transferred to operating table in a supine position. Satisfactory general anesthesia was induced by anesthesiology.    Standard anterolateral, anteromedial arthroscopy portals were obtained. The anteromedial portal was obtained with a spinal needle for localization under direct visualization with subsequent diagnostic findings.   Incisions were made for knee arthroscopy portals.  Diagnostic knee arthroscopy was first performed.  Tricompartmental major synovectomy was then performed using oscillating shaver.  Once this was performed we then addressed the medial compartment which showed widespread grade III-IV chondromalacia on both the femoral and tibial side.  The knee was placed in a  valgus position to address the medial meniscus.  He had a large degenerative complex tear that extended from the mid body back to the posterior horn in the root.  Partial medial meniscectomy was performed using a meniscus basket and oscillating shaver back to stable border.  Given the extent of the tear he had a little remaining medial meniscus left after the partial meniscectomy.  Gentle chondroplasty was then performed for the medial femoral condyle and medial tibial plateau.  We then continued our arthroscopy into the lateral compartment.  With the leg in a figure 4 position we are able to identify a small degenerative tear of the lateral meniscus for which a partial lateral meniscectomy was performed using oscillating shaver.  We then placed the knee in full extension to address the patellofemoral compartment.  There was extensive synovitis in the suprapatellar pouch.  There were no loose bodies.  Again patellofemoral compartment exhibited grade IV chondromalacia.  Excess fluid was then removed from the knee joint.  Incisions were closed with interrupted nylon sutures.  Sterile dressings were applied.  Patient tolerated procedure well and immediate complications.  Suprapatellar pouch and gutters: severe synovitis or debris. Patella chondral surface: Grade 4 Trochlear chondral surface: Grade 4 Patellofemoral tracking: normal Medial meniscus: large complex degenerative tear.  Medial femoral condyle flexion bearing surface: Grade 4 Medial femoral condyle extension bearing surface: Grade 4 Medial tibial plateau: Grade 4 Anterior cruciate ligament:stable Posterior cruciate ligament:stable Lateral meniscus: small degenerative tear.   Lateral femoral condyle flexion bearing surface: Grade 2 Lateral femoral condyle extension bearing surface: Grade 2 Lateral tibial plateau: Grade 2  DISPOSITION: The patient was awakened from general anesthetic, extubated, taken to the recovery room in medically stable  condition, no apparent complications. The patient may be weightbearing as tolerated to the operative lower extremity.  Range of motion of right knee as tolerated.  Azucena Cecil, MD [place] 11:32 AM

## 2018-08-03 NOTE — Telephone Encounter (Signed)
[facility] called and wanted to let you know patient is also taking Oxycodone 10 mg 4 times a day, prescribed by Dr. Wolfgang Phoenix. He just picked up Rx on 07/31/2018.  Today he was prescribed Norco by Dr Erlinda Hong and [facility] wanted to know if you still wanted to fill RX-Norco?  Please advise.   CB [phone]

## 2018-08-03 NOTE — H&P (Signed)
PREOPERATIVE H&P  Chief Complaint: right knee medial meniscal tear  HPI: [name] is a 78 y.o. male who presents for surgical treatment of right knee medial meniscal tear.  He denies any changes in medical history.  Past Medical History:  Diagnosis Date  . Arthritis   . Cancer ([place])    skin  . Carpal tunnel syndrome   . Cataract    left eye  . Chronic back pain    multiple back surgeries  . Complication of anesthesia    pt states woke up during hemorroid surgery  . Constipation    Metamucil every mornind  . DVT (deep venous thrombosis) ([place]) 03/2007   left leg   . GERD (gastroesophageal reflux disease)    takes Protonix daily  . Glaucoma   . History of colon polyps   . History of hyperglycemia   . History of MRSA infection 1992  . HTN (hypertension)    takes  Ramipril daily  . Hyperlipidemia    takes Pravastatin 3 times a week  . Joint pain   . Joint swelling   . Nocturia   . Paroxysmal atrial fibrillation (HCC)    takes Pradaxa daily, Chads2vasc score 2(07/11/14)  . Pneumonia    many yrs ago  . Pulmonary embolism ([place]) 2008  . Urinary frequency   . Urinary urgency    Past Surgical History:  Procedure Laterality Date  . BIOPSY  04/01/2018   Procedure: BIOPSY;  Surgeon: Rogene Houston, MD;  [facility];  Service: Endoscopy;;  gastric  . CARDIAC CATHETERIZATION  2008  . CARPAL TUNNEL RELEASE Right   . cataract surgery Right   . cervical and lumbar surgery    . COLONOSCOPY  04/24/2011   Procedure: COLONOSCOPY;  Surgeon: Rogene Houston, MD;  [facility];  Service: Endoscopy;  Laterality: N/A;  1200  . COLONOSCOPY N/A 08/13/2016   Procedure: COLONOSCOPY;  Surgeon: Rogene Houston, MD;  [facility];  Service: Endoscopy;  Laterality: N/A;  1200  . ESOPHAGOGASTRODUODENOSCOPY  03/10/2012   Procedure: ESOPHAGOGASTRODUODENOSCOPY (EGD);  Surgeon: Rogene Houston, MD;  [facility];  Service: Endoscopy;   Laterality: N/A;  325  . ESOPHAGOGASTRODUODENOSCOPY (EGD) WITH PROPOFOL N/A 04/01/2018   Procedure: ESOPHAGOGASTRODUODENOSCOPY (EGD) WITH PROPOFOL;  Surgeon: Rogene Houston, MD;  [facility];  Service: Endoscopy;  Laterality: N/A;  . HARDWARE REMOVAL N/A 04/24/2014   Procedure: REMOVAL OF SACRAL INSTRUMENTATION WITH [place];  Surgeon: Kristeen Miss, MD;  Location: [facility];  Service: Neurosurgery;  Laterality: N/A;  . HEMORRHOIDECTOMY WITH HEMORRHOID BANDING    . KNEE ARTHROSCOPY Right   . KNEE SURGERY     left  . LASIK    . POLYPECTOMY  08/13/2016   Procedure: POLYPECTOMY;  Surgeon: Rogene Houston, MD;  [facility];  Service: Endoscopy;;  colon  . PVI  01/24/10   afib ablation   Social History   Socioeconomic History  . Marital status: Married    Spouse name: Not on file  . Number of children: Not on file  . Years of education: Not on file  . Highest education level: Not on file  Occupational History  . Occupation: retired  Scientific laboratory technician  . Financial resource strain: Not on file  . Food insecurity:    Worry: Not on file    Inability: Not on file  . Transportation needs:    Medical: Not on file  Non-medical: Not on file  Tobacco Use  . Smoking status: Former Smoker    Packs/day: 0.25    Years: 3.00    Pack years: 0.75    Types: Cigarettes    Last attempt to quit: 03/30/1969    Years since quitting: 49.3  . Smokeless tobacco: Never Used  Substance and Sexual Activity  . Alcohol use: Yes    Alcohol/week: 7.0 standard drinks    Types: 7 Glasses of wine per week    Comment: wine nightly  . Drug use: No  . Sexual activity: Yes  Lifestyle  . Physical activity:    Days per week: Not on file    Minutes per session: Not on file  . Stress: Not on file  Relationships  . Social connections:    Talks on phone: Not on file    Gets together: Not on file    Attends religious service: Not on file    Active member of club or organization: Not on file     Attends meetings of clubs or organizations: Not on file    Relationship status: Not on file  Other Topics Concern  . Not on file  Social History Narrative  . Not on file   Family History  Problem Relation Age of Onset  . Hypertension Mother   . Transient ischemic attack Mother   . Heart disease Father   . Diabetes Brother   . Healthy Daughter   . Colon cancer Sister   . Hypertension Other   . Heart disease Other    Allergies  Allergen Reactions  . Sectral [Acebutolol Hcl] Other (See Comments)    Side effects  . Sulfonamide Derivatives Other (See Comments)    Runs blood pressure up  . Zocor [Simvastatin] Other (See Comments)    Joint pain   Prior to Admission medications   Medication Sig Start Date End Date Taking? Authorizing Provider  amLODipine (NORVASC) 5 MG tablet Take 1 tablet (5 mg total) by mouth daily. 05/25/18  Yes Luking, Scott A, MD  diphenhydrAMINE-APAP, sleep, (TYLENOL PM EXTRA STRENGTH PO) Take 1 tablet by mouth at bedtime as needed (sleep).    Yes [provider]  ELIQUIS 5 MG TABS tablet TAKE 1 TABLET BY MOUTH TWICE A DAY 06/30/18  Yes Luking, Scott A, MD  famotidine (PEPCID AC) 10 MG tablet Take 1 tablet (10 mg total) by mouth at bedtime. 04/06/18  Yes Rehman, Mechele Dawley, MD  indapamide (LOZOL) 2.5 MG tablet Take 1 tablet (2.5 mg total) by mouth daily. 05/25/18  Yes Luking, Scott A, MD  latanoprost (XALATAN) 0.005 % ophthalmic solution Place 1 drop into both eyes at bedtime. 03/24/16  Yes [provider]  Oxycodone HCl 10 MG TABS One 4 times a day prn pain 05/25/18  Yes Luking, Scott A, MD  pantoprazole (PROTONIX) 40 MG tablet Take 1 tablet (40 mg total) by mouth daily before breakfast. 04/06/18  Yes Rehman, Mechele Dawley, MD  pravastatin (PRAVACHOL) 80 MG tablet Take 1 tablet (80 mg total) by mouth daily. 05/25/18  Yes Kathyrn Drown, MD  ramipril (ALTACE) 10 MG capsule Take 1 capsule (10 mg total) by mouth 2 (two) times daily. 05/25/18  Yes Kathyrn Drown, MD     Positive ROS: All other systems have been reviewed and were otherwise negative with the exception of those mentioned in the HPI and as above.  Physical Exam: General: Alert, no acute distress Cardiovascular: No pedal edema Respiratory: No cyanosis, no use  of accessory musculature GI: abdomen soft Skin: No lesions in the area of chief complaint Neurologic: Sensation intact distally Psychiatric: Patient is competent for consent with normal mood and affect Lymphatic: no lymphedema  MUSCULOSKELETAL: exam stable  Assessment: right knee medial meniscal tear  Plan: Plan for Procedure(s): RIGHT KNEE ARTHROSCOPY WITH PARTIAL MEDIAL MENISCECTOMY  The risks benefits and alternatives were discussed with the patient including but not limited to the risks of nonoperative treatment, versus surgical intervention including infection, bleeding, nerve injury,  blood clots, cardiopulmonary complications, morbidity, mortality, among others, and they were willing to proceed.   Eduard Roux, MD   08/03/2018 9:18 AM

## 2018-08-03 NOTE — Discharge Instructions (Signed)
NO TYLENOL BEFORE  6 PM TONIGHT!            Post-operative patient instructions  Knee Arthroscopy    Ice:  Place intermittent ice or cooler pack over your knee, 30 minutes on and 30 minutes off.  Continue this for the first 72 hours after surgery, then save ice for use after therapy sessions or on more active days.    Weight:  You may bear weight on your leg as your symptoms allow.  Crutches:  Use crutches (or walker) to assist in walking until told to discontinue by your physical therapist or physician. This will help to reduce pain.  Strengthening:  Perform simple thigh squeezes (isometric quad contractions) and straight leg lifts as you are able (3 sets of 5 to 10 repetitions, 3 times a day).  For the leg lifts, have someone support under your ankle in the beginning until you have increased strength enough to do this on your own.  To help get started on thigh squeezes, place a pillow under your knee and push down on the pillow with back of knee (sometimes easier to do than with your leg fully straight).  Motion:  Perform gentle knee motion as tolerated - this is gentle bending and straightening of the knee. Seated heel slides: you can start by sitting in a chair, remove your brace, and gently slide your heel back on the floor - allowing your knee to bend. Have someone help you straighten your knee (or use your other leg/foot hooked under your ankle.   Dressing:  Perform 1st dressing change at 2 days postoperative. A moderate amount of blood tinged drainage is to be expected.  So if you bleed through the dressing on the first or second day or if you have fevers, it is fine to change the dressing/check the wounds early and redress wound. Elevate your leg.  If it bleeds through again, or if the incisions are leaking frank blood, please call the office. May change dressing every 1-2 days thereafter to help watch wounds. Can purchase Tegaderm (or 67M Nexcare) water resistant dressings at  local pharmacy / [facility].  Shower:  Light shower is ok after 2 days.  Please take shower, NO bath. Recover with gauze and ace wrap to help keep wounds protected.    Pain medication:  A narcotic pain medication has been prescribed.  Take as directed.  Typically you need narcotic pain medication more regularly during the first 3 to 5 days after surgery.  Decrease your use of the medication as the pain improves.  Narcotics can sometimes cause constipation, even after a few doses.  If you have problems with constipation, you can take an over the counter stool softener or light laxative.  If you have persistent problems, please notify your physicians office.  Physical therapy: Additional activity guidelines to be provided by your physician or physical therapist at follow-up visits.   Driving: Do not recommend driving x 2 weeks post surgical, especially if surgery performed on right side. Should not drive while taking narcotic pain medications. It typically takes at least 2 weeks to restore sufficient neuromuscular function for normal reaction times for driving safety.   Call [phone] for questions or problems. Evenings you will be forwarded to the hospital operator.  Ask for the orthopaedic physician on call. Please call if you experience:    o Redness, foul smelling, or persistent drainage from the surgical site  o worsening knee pain and swelling not responsive to medication  o any calf pain and or swelling of the lower leg  o temperatures greater than 101.5 F o other questions or concerns   Thank you for allowing [place] to be a part of your care.       Post Anesthesia Home Care Instructions  Activity: Get plenty of rest for the remainder of the day. A responsible individual must stay with you for 24 hours following the procedure.  For the next 24 hours, DO NOT: -Drive a car -Paediatric nurse -Drink alcoholic beverages -Take any medication unless instructed by your physician -Make  any legal decisions or sign important papers.  Meals: Start with liquid foods such as gelatin or soup. Progress to regular foods as tolerated. Avoid greasy, spicy, heavy foods. If nausea and/or vomiting occur, drink only clear liquids until the nausea and/or vomiting subsides. Call your physician if vomiting continues.  Special Instructions/Symptoms: Your throat may feel dry or sore from the anesthesia or the breathing tube placed in your throat during surgery. If this causes discomfort, gargle with warm salt water. The discomfort should disappear within 24 hours.  If you had a scopolamine patch placed behind your ear for the management of post- operative nausea and/or vomiting:  1. The medication in the patch is effective for 72 hours, after which it should be removed.  Wrap patch in a tissue and discard in the trash. Wash hands thoroughly with soap and water. 2. You may remove the patch earlier than 72 hours if you experience unpleasant side effects which may include dry mouth, dizziness or visual disturbances. 3. Avoid touching the patch. Wash your hands with soap and water after contact with the patch.

## 2018-08-03 NOTE — Anesthesia Preprocedure Evaluation (Addendum)
Anesthesia Evaluation  Patient identified by MRN, date of birth, ID band Patient awake    Reviewed: Allergy & Precautions, NPO status , Patient's Chart, lab work & pertinent test results  Airway Mallampati: III  TM Distance: >3 FB Neck ROM: Full    Dental  (+) Teeth Intact, Dental Advisory Given, Partial Upper   Pulmonary former smoker,    breath sounds clear to auscultation       Cardiovascular hypertension, + dysrhythmias Atrial Fibrillation  Rhythm:Regular Rate:Normal     Neuro/Psych  Neuromuscular disease negative psych ROS   GI/Hepatic Neg liver ROS, GERD  Medicated,  Endo/Other  negative endocrine ROS  Renal/GU negative Renal ROS     Musculoskeletal  (+) Arthritis ,   Abdominal Normal abdominal exam  (+)   Peds  Hematology negative hematology ROS (+)   Anesthesia Other Findings - HLD  Reproductive/Obstetrics                            Lab Results  Component Value Date   WBC 6.4 03/28/2018   HGB 14.3 03/28/2018   HCT 41.7 03/28/2018   MCV 91.9 03/28/2018   PLT 299 03/28/2018   Lab Results  Component Value Date   CREATININE 1.10 08/01/2018   BUN 27 (H) 08/01/2018   NA 138 08/01/2018   K 4.3 08/01/2018   CL 101 08/01/2018   CO2 26 08/01/2018   Lab Results  Component Value Date   INR 1.12 09/18/2010   INR 2.68 (H) 01/25/2010   INR 2.46 (H) 01/24/2010   EKG: normal sinus rhythm.  Anesthesia Physical Anesthesia Plan  ASA: III  Anesthesia Plan: General   Post-op Pain Management:    Induction: Intravenous  PONV Risk Score and Plan: 3 and Ondansetron, Dexamethasone and Midazolam  Airway Management Planned: Oral ETT  Additional Equipment: None  Intra-op Plan:   Post-operative Plan: Extubation in OR  Informed Consent: I have reviewed the patients History and Physical, chart, labs and discussed the procedure including the risks, benefits and alternatives for the  proposed anesthesia with the patient or authorized representative who has indicated his/her understanding and acceptance.     Dental advisory given  Plan Discussed with: CRNA  Anesthesia Plan Comments:        Anesthesia Quick Evaluation

## 2018-08-03 NOTE — Transfer of Care (Signed)
Immediate Anesthesia Transfer of Care Note  Patient: [name]  Procedure(s) Performed: RIGHT KNEE ARTHROSCOPY WITH PARTIAL MEDIAL MENISCECTOMY (Right Knee)  Patient Location: PACU  Anesthesia Type:General  Level of Consciousness: awake, alert  and oriented  Airway & Oxygen Therapy: Patient Spontanous Breathing and Patient connected to face mask oxygen  Post-op Assessment: Report given to RN and Post -op Vital signs reviewed and stable  Post vital signs: Reviewed and stable  Last Vitals:  Vitals Value Taken Time  BP    Temp    Pulse 69 08/03/2018 11:43 AM  Resp    SpO2 100 % 08/03/2018 11:43 AM  Vitals shown include unvalidated device data.  Last Pain:  Vitals:   08/03/18 0938  TempSrc: Oral  PainSc: 0-No pain      Patients Stated Pain Goal: 3 (79/48/01 6553)  Complications: No apparent anesthesia complications

## 2018-08-03 NOTE — Telephone Encounter (Signed)
Ok don't fill it.

## 2018-08-03 NOTE — Anesthesia Procedure Notes (Signed)
Procedure Name: LMA Insertion Performed by: Kinslie Hove M, CRNA Pre-anesthesia Checklist: Patient identified, Emergency Drugs available, Suction available, Patient being monitored and Timeout performed Patient Re-evaluated:Patient Re-evaluated prior to induction Oxygen Delivery Method: Circle system utilized Preoxygenation: Pre-oxygenation with 100% oxygen Induction Type: IV induction LMA: LMA inserted LMA Size: 4.0 Tube type: Oral Number of attempts: 1 Placement Confirmation: positive ETCO2,  CO2 detector and breath sounds checked- equal and bilateral Tube secured with: Tape Dental Injury: Teeth and Oropharynx as per pre-operative assessment        

## 2018-08-03 NOTE — Telephone Encounter (Signed)
CALLED [facility] TO [facility]

## 2018-08-03 NOTE — H&P (Signed)
PREOPERATIVE H&P  Chief Complaint: right knee medial meniscal tear  HPI: [name] is a 78 y.o. male who presents for surgical treatment of right knee medial meniscal tear.  He denies any changes in medical history.  Past Medical History:  Diagnosis Date  . Arthritis   . Cancer ([place])    skin  . Carpal tunnel syndrome   . Cataract    left eye  . Chronic back pain    multiple back surgeries  . Complication of anesthesia    pt states woke up during hemorroid surgery  . Constipation    Metamucil every mornind  . DVT (deep venous thrombosis) ([place]) 03/2007   left leg   . GERD (gastroesophageal reflux disease)    takes Protonix daily  . Glaucoma   . History of colon polyps   . History of hyperglycemia   . History of MRSA infection 1992  . HTN (hypertension)    takes  Ramipril daily  . Hyperlipidemia    takes Pravastatin 3 times a week  . Joint pain   . Joint swelling   . Nocturia   . Paroxysmal atrial fibrillation (HCC)    takes Pradaxa daily, Chads2vasc score 2(07/11/14)  . Pneumonia    many yrs ago  . Pulmonary embolism ([place]) 2008  . Urinary frequency   . Urinary urgency    Past Surgical History:  Procedure Laterality Date  . BIOPSY  04/01/2018   Procedure: BIOPSY;  Surgeon: Rogene Houston, MD;  [facility];  Service: Endoscopy;;  gastric  . CARDIAC CATHETERIZATION  2008  . CARPAL TUNNEL RELEASE Right   . cataract surgery Right   . cervical and lumbar surgery    . COLONOSCOPY  04/24/2011   Procedure: COLONOSCOPY;  Surgeon: Rogene Houston, MD;  [facility];  Service: Endoscopy;  Laterality: N/A;  1200  . COLONOSCOPY N/A 08/13/2016   Procedure: COLONOSCOPY;  Surgeon: Rogene Houston, MD;  [facility];  Service: Endoscopy;  Laterality: N/A;  1200  . ESOPHAGOGASTRODUODENOSCOPY  03/10/2012   Procedure: ESOPHAGOGASTRODUODENOSCOPY (EGD);  Surgeon: Rogene Houston, MD;  [facility];  Service: Endoscopy;   Laterality: N/A;  325  . ESOPHAGOGASTRODUODENOSCOPY (EGD) WITH PROPOFOL N/A 04/01/2018   Procedure: ESOPHAGOGASTRODUODENOSCOPY (EGD) WITH PROPOFOL;  Surgeon: Rogene Houston, MD;  [facility];  Service: Endoscopy;  Laterality: N/A;  . HARDWARE REMOVAL N/A 04/24/2014   Procedure: REMOVAL OF SACRAL INSTRUMENTATION WITH [place];  Surgeon: Kristeen Miss, MD;  Location: [facility];  Service: Neurosurgery;  Laterality: N/A;  . HEMORRHOIDECTOMY WITH HEMORRHOID BANDING    . KNEE ARTHROSCOPY Right   . KNEE SURGERY     left  . LASIK    . POLYPECTOMY  08/13/2016   Procedure: POLYPECTOMY;  Surgeon: Rogene Houston, MD;  [facility];  Service: Endoscopy;;  colon  . PVI  01/24/10   afib ablation   Social History   Socioeconomic History  . Marital status: Married    Spouse name: Not on file  . Number of children: Not on file  . Years of education: Not on file  . Highest education level: Not on file  Occupational History  . Occupation: retired  Scientific laboratory technician  . Financial resource strain: Not on file  . Food insecurity:    Worry: Not on file    Inability: Not on file  . Transportation needs:    Medical: Not on file  Non-medical: Not on file  Tobacco Use  . Smoking status: Former Smoker    Packs/day: 0.25    Years: 3.00    Pack years: 0.75    Types: Cigarettes    Last attempt to quit: 03/30/1969    Years since quitting: 49.3  . Smokeless tobacco: Never Used  Substance and Sexual Activity  . Alcohol use: Yes    Alcohol/week: 7.0 standard drinks    Types: 7 Glasses of wine per week    Comment: wine nightly  . Drug use: No  . Sexual activity: Yes  Lifestyle  . Physical activity:    Days per week: Not on file    Minutes per session: Not on file  . Stress: Not on file  Relationships  . Social connections:    Talks on phone: Not on file    Gets together: Not on file    Attends religious service: Not on file    Active member of club or organization: Not on file     Attends meetings of clubs or organizations: Not on file    Relationship status: Not on file  Other Topics Concern  . Not on file  Social History Narrative  . Not on file   Family History  Problem Relation Age of Onset  . Hypertension Mother   . Transient ischemic attack Mother   . Heart disease Father   . Diabetes Brother   . Healthy Daughter   . Colon cancer Sister   . Hypertension Other   . Heart disease Other    Allergies  Allergen Reactions  . Sectral [Acebutolol Hcl] Other (See Comments)    Side effects  . Sulfonamide Derivatives Other (See Comments)    Runs blood pressure up  . Zocor [Simvastatin] Other (See Comments)    Joint pain   Prior to Admission medications   Medication Sig Start Date End Date Taking? Authorizing Provider  amLODipine (NORVASC) 5 MG tablet Take 1 tablet (5 mg total) by mouth daily. 05/25/18  Yes Luking, Scott A, MD  diphenhydrAMINE-APAP, sleep, (TYLENOL PM EXTRA STRENGTH PO) Take 1 tablet by mouth at bedtime as needed (sleep).    Yes [provider]  ELIQUIS 5 MG TABS tablet TAKE 1 TABLET BY MOUTH TWICE A DAY 06/30/18  Yes Luking, Scott A, MD  famotidine (PEPCID AC) 10 MG tablet Take 1 tablet (10 mg total) by mouth at bedtime. 04/06/18  Yes Rehman, Mechele Dawley, MD  indapamide (LOZOL) 2.5 MG tablet Take 1 tablet (2.5 mg total) by mouth daily. 05/25/18  Yes Luking, Scott A, MD  latanoprost (XALATAN) 0.005 % ophthalmic solution Place 1 drop into both eyes at bedtime. 03/24/16  Yes [provider]  Oxycodone HCl 10 MG TABS One 4 times a day prn pain 05/25/18  Yes Luking, Scott A, MD  pantoprazole (PROTONIX) 40 MG tablet Take 1 tablet (40 mg total) by mouth daily before breakfast. 04/06/18  Yes Rehman, Mechele Dawley, MD  pravastatin (PRAVACHOL) 80 MG tablet Take 1 tablet (80 mg total) by mouth daily. 05/25/18  Yes Kathyrn Drown, MD  ramipril (ALTACE) 10 MG capsule Take 1 capsule (10 mg total) by mouth 2 (two) times daily. 05/25/18  Yes Kathyrn Drown, MD     Positive ROS: All other systems have been reviewed and were otherwise negative with the exception of those mentioned in the HPI and as above.  Physical Exam: General: Alert, no acute distress Cardiovascular: No pedal edema Respiratory: No cyanosis, no use  of accessory musculature GI: abdomen soft Skin: No lesions in the area of chief complaint Neurologic: Sensation intact distally Psychiatric: Patient is competent for consent with normal mood and affect Lymphatic: no lymphedema  MUSCULOSKELETAL: exam stable  Assessment: right knee medial meniscal tear  Plan: Plan for Procedure(s): RIGHT KNEE ARTHROSCOPY WITH PARTIAL MEDIAL MENISCECTOMY  The risks benefits and alternatives were discussed with the patient including but not limited to the risks of nonoperative treatment, versus surgical intervention including infection, bleeding, nerve injury,  blood clots, cardiopulmonary complications, morbidity, mortality, among others, and they were willing to proceed.   Eduard Roux, MD   08/03/2018 10:26 AM

## 2018-08-04 NOTE — Anesthesia Postprocedure Evaluation (Signed)
Anesthesia Post Note  Patient: [name]  Procedure(s) Performed: RIGHT KNEE ARTHROSCOPY WITH PARTIAL MEDIAL MENISCECTOMY (Right Knee)     Patient location during evaluation: PACU Anesthesia Type: General Level of consciousness: awake and alert Pain management: pain level controlled Vital Signs Assessment: post-procedure vital signs reviewed and stable Respiratory status: spontaneous breathing, nonlabored ventilation, respiratory function stable and patient connected to nasal cannula oxygen Cardiovascular status: blood pressure returned to baseline and stable Postop Assessment: no apparent nausea or vomiting Anesthetic complications: no    Last Vitals:  Vitals:   08/03/18 1230 08/03/18 1321  BP: (!) 182/82 (!) 160/75  Pulse: (!) 57 (!) 57  Resp: 12 15  Temp:  36.6 C  SpO2: 96% 97%    Last Pain:  Vitals:   08/03/18 1321  TempSrc:   PainSc: 2                  Effie Berkshire

## 2018-08-05 ENCOUNTER — Encounter (HOSPITAL_BASED_OUTPATIENT_CLINIC_OR_DEPARTMENT_OTHER): Payer: Self-pay | Admitting: Orthopaedic Surgery

## 2018-08-10 ENCOUNTER — Ambulatory Visit (INDEPENDENT_AMBULATORY_CARE_PROVIDER_SITE_OTHER): Payer: PPO | Admitting: Physician Assistant

## 2018-08-10 ENCOUNTER — Encounter (INDEPENDENT_AMBULATORY_CARE_PROVIDER_SITE_OTHER): Payer: Self-pay | Admitting: Orthopaedic Surgery

## 2018-08-10 ENCOUNTER — Other Ambulatory Visit: Payer: Self-pay

## 2018-08-10 DIAGNOSIS — Z9889 Other specified postprocedural states: Secondary | ICD-10-CM

## 2018-08-10 NOTE — Progress Notes (Signed)
Post-Op Visit Note   Patient: [name]           Date of Birth: 06/16/1940           MRN: [id number] Visit Date: 08/10/2018 PCP: Kathyrn Drown, MD   Assessment & Plan:  Chief Complaint:  Chief Complaint  Patient presents with  . Right Knee - Pain   Visit Diagnoses:  1. S/P right knee arthroscopy     Plan: Patient is a pleasant 78 year old gentleman who presents to our clinic today 1 week status post right knee arthroscopic debridement medial lateral meniscus and chondroplasty, date of surgery 08/03/2018.  It was noted during operative intervention he had grade 4 changes to the medial and patellofemoral compartments with grade 2 changes laterally.  He has had a fair amount of pain and swelling since surgery.  No fevers or chills.  He does note that he has been up doing a lot to include putting out bales of hay.  He has been applying ice and taking a pain pill and Tylenol as needed.  Examination of his right knee reveals a 1+ effusion.  Well-healed surgical portals without evidence of infection.  Calf is soft and nontender.  Range of motion 0 to 100 degrees.  He is neurovascular intact distally.  At this point, we will remove the stitches.  He will advance with activity as tolerated.  He will ice and elevate as needed for pain and swelling.  He will follow-up with [place] in 5 weeks time for repeat evaluation.  I have provided the patient with range of motion exercises to do at home on his own as I do not believe he needs formal physical therapy.  Follow-Up Instructions: Return in about 5 weeks (around 09/14/2018).   Orders:  No orders of the defined types were placed in this encounter.  No orders of the defined types were placed in this encounter.   Imaging: No new imaging  PMFS History: Patient Active Problem List   Diagnosis Date Noted  . S/P right knee arthroscopy 08/10/2018  . Acute tear lateral meniscus, right, initial encounter 08/03/2018  . Acute medial meniscus tear,  right, subsequent encounter 07/13/2018  . Melena 03/28/2018  . Closed fracture of lateral portion of left tibial plateau 03/09/2018  . Aortic atherosclerosis ([place]) 11/05/2016  . History of colonic polyps 06/16/2016  . Pseudoarthrosis of lumbar spine 04/24/2014  . Hx of adenomatous colonic polyps 11/22/2013  . Prediabetes 10/12/2013  . Fatigue 12/21/2012  . Snoring 12/21/2012  . Chronic back pain greater than 3 months duration 10/31/2012  . Sinusitis 02/22/2012  . GERD (gastroesophageal reflux disease) 02/22/2012  . Hyperlipidemia 01/21/2012  . HYPERTENSION, BENIGN 12/06/2009  . ATRIAL FIBRILLATION, PAROXYSMAL 12/06/2009   Past Medical History:  Diagnosis Date  . Arthritis   . Cancer ([place])    skin  . Carpal tunnel syndrome   . Cataract    left eye  . Chronic back pain    multiple back surgeries  . Complication of anesthesia    pt states woke up during hemorroid surgery  . Constipation    Metamucil every mornind  . DVT (deep venous thrombosis) ([place]) 03/2007   left leg   . GERD (gastroesophageal reflux disease)    takes Protonix daily  . Glaucoma   . History of colon polyps   . History of hyperglycemia   . History of MRSA infection 1992  . HTN (hypertension)    takes  Ramipril daily  . Hyperlipidemia  takes Pravastatin 3 times a week  . Joint pain   . Joint swelling   . Nocturia   . Paroxysmal atrial fibrillation (HCC)    takes Pradaxa daily, Chads2vasc score 2(07/11/14)  . Pneumonia    many yrs ago  . Pulmonary embolism ([place]) 2008  . Urinary frequency   . Urinary urgency     Family History  Problem Relation Age of Onset  . Hypertension Mother   . Transient ischemic attack Mother   . Heart disease Father   . Diabetes Brother   . Healthy Daughter   . Colon cancer Sister   . Hypertension Other   . Heart disease Other     Past Surgical History:  Procedure Laterality Date  . BIOPSY  04/01/2018   Procedure: BIOPSY;  Surgeon: Rogene Houston, MD;   [facility];  Service: Endoscopy;;  gastric  . CARDIAC CATHETERIZATION  2008  . CARPAL TUNNEL RELEASE Right   . cataract surgery Right   . cervical and lumbar surgery    . COLONOSCOPY  04/24/2011   Procedure: COLONOSCOPY;  Surgeon: Rogene Houston, MD;  [facility];  Service: Endoscopy;  Laterality: N/A;  1200  . COLONOSCOPY N/A 08/13/2016   Procedure: COLONOSCOPY;  Surgeon: Rogene Houston, MD;  [facility];  Service: Endoscopy;  Laterality: N/A;  1200  . ESOPHAGOGASTRODUODENOSCOPY  03/10/2012   Procedure: ESOPHAGOGASTRODUODENOSCOPY (EGD);  Surgeon: Rogene Houston, MD;  [facility];  Service: Endoscopy;  Laterality: N/A;  325  . ESOPHAGOGASTRODUODENOSCOPY (EGD) WITH PROPOFOL N/A 04/01/2018   Procedure: ESOPHAGOGASTRODUODENOSCOPY (EGD) WITH PROPOFOL;  Surgeon: Rogene Houston, MD;  [facility];  Service: Endoscopy;  Laterality: N/A;  . HARDWARE REMOVAL N/A 04/24/2014   Procedure: REMOVAL OF SACRAL INSTRUMENTATION WITH [place];  Surgeon: Kristeen Miss, MD;  Location: [facility];  Service: Neurosurgery;  Laterality: N/A;  . HEMORRHOIDECTOMY WITH HEMORRHOID BANDING    . KNEE ARTHROSCOPY Right   . KNEE ARTHROSCOPY WITH MEDIAL MENISECTOMY Right 08/03/2018   Procedure: RIGHT KNEE ARTHROSCOPY WITH PARTIAL MEDIAL MENISCECTOMY;  Surgeon: Leandrew Koyanagi, MD;  Location: [facility];  Service: Orthopedics;  Laterality: Right;  . KNEE SURGERY     left  . LASIK    . POLYPECTOMY  08/13/2016   Procedure: POLYPECTOMY;  Surgeon: Rogene Houston, MD;  [facility];  Service: Endoscopy;;  colon  . PVI  01/24/10   afib ablation   Social History   Occupational History  . Occupation: retired  Tobacco Use  . Smoking status: Former Smoker    Packs/day: 0.25    Years: 3.00    Pack years: 0.75    Types: Cigarettes    Last attempt to quit: 03/30/1969    Years since quitting: 49.3  . Smokeless tobacco: Never Used  Substance and  Sexual Activity  . Alcohol use: Yes    Alcohol/week: 7.0 standard drinks    Types: 7 Glasses of wine per week    Comment: wine nightly  . Drug use: No  . Sexual activity: Yes

## 2018-08-30 ENCOUNTER — Encounter: Payer: Self-pay | Admitting: Family Medicine

## 2018-08-30 ENCOUNTER — Other Ambulatory Visit: Payer: Self-pay | Admitting: Family Medicine

## 2018-08-30 ENCOUNTER — Other Ambulatory Visit: Payer: Self-pay

## 2018-08-30 ENCOUNTER — Ambulatory Visit (INDEPENDENT_AMBULATORY_CARE_PROVIDER_SITE_OTHER): Payer: PPO | Admitting: Family Medicine

## 2018-08-30 VITALS — Wt 190.0 lb

## 2018-08-30 DIAGNOSIS — I480 Paroxysmal atrial fibrillation: Secondary | ICD-10-CM | POA: Diagnosis not present

## 2018-08-30 DIAGNOSIS — M549 Dorsalgia, unspecified: Secondary | ICD-10-CM | POA: Diagnosis not present

## 2018-08-30 DIAGNOSIS — I700 Atherosclerosis of aorta: Secondary | ICD-10-CM | POA: Diagnosis not present

## 2018-08-30 DIAGNOSIS — I10 Essential (primary) hypertension: Secondary | ICD-10-CM

## 2018-08-30 DIAGNOSIS — G8929 Other chronic pain: Secondary | ICD-10-CM

## 2018-08-30 DIAGNOSIS — E7849 Other hyperlipidemia: Secondary | ICD-10-CM

## 2018-08-30 DIAGNOSIS — Z79899 Other long term (current) drug therapy: Secondary | ICD-10-CM

## 2018-08-30 MED ORDER — OXYCODONE HCL 10 MG PO TABS: ORAL_TABLET | ORAL | 0 refills | Status: DC

## 2018-08-30 MED ORDER — PANTOPRAZOLE SODIUM 40 MG PO TBEC: 40.0000 mg | DELAYED_RELEASE_TABLET | Freq: Every day | ORAL | 5 refills | Status: DC

## 2018-08-30 NOTE — Progress Notes (Signed)
Subjective:    Patient ID: [name], male    DOB: [birth year], 78 y.o.   MRN: [id number] Unable to do video patient did not have that capability Telephone visit Coronavirus outbreak   HPI This patient was seen today for chronic pain  The medication list was reviewed and updated.   -Compliance with medication: Oxycodone 10 mg   - Number patient states they take daily: 1-4  -when was the last dose patient took? 7 am this morning  The patient was advised the importance of maintaining medication and not using illegal substances with these.  Here for refills and follow up  The patient was educated that we can provide 3 monthly scripts for their medication, it is their responsibility to follow the instructions.  Side effects or complications from medications: none; not helping pain with recent knee surgery. Pt is also taking 2 tylenol at lunch  Patient is aware that pain medications are meant to minimize the severity of the pain to allow their pain levels to improve to allow for better function. They are aware of that pain medications cannot totally remove their pain. Patient states that the pain medicine does help him function.  Without it he would have very difficult time.  He uses it for his severe chronic back pain and osteoarthritis in his knees and hips Due for UDT ( at least once per year) :   Patient does have aortic atherosclerosis best approach to this is keeping his cholesterol under good control and eating healthy Patient doing well with cholesterol previous labs reviewed new labs in the fall  Atrial fibrillation under good control currently sees cardiology on a regular basis Blood pressure is been reportedly good   Pt states he has knee surgery 3 weeks ago and is having a hard time with things.   Virtual Visit via Telephone Note  I connected with [name] on 08/30/18 at 10:00 AM EDT by telephone and verified that I am speaking with the correct person  using two identifiers.   I discussed the limitations, risks, security and privacy concerns of performing an evaluation and management service by telephone and the availability of in person appointments. I also discussed with the patient that there may be a patient responsible charge related to this service. The patient expressed understanding and agreed to proceed.   History of Present Illness:    Observations/Objective:   Assessment and Plan:   Follow Up Instructions:    I discussed the assessment and treatment plan with the patient. The patient was provided an opportunity to ask questions and all were answered. The patient agreed with the plan and demonstrated an understanding of the instructions.   The patient was advised to call back or seek an in-person evaluation if the symptoms worsen or if the condition fails to improve as anticipated.  I provided 15 minutes of non-face-to-face time during this encounter.   Vicente Males, LPN     Review of Systems     Objective:   Physical Exam        Assessment & Plan:  Aortic atherosclerosis continue cholesterol medicine watch diet Hyperlipidemia previous labs reviewed continue current medicine check labs in the fall Blood pressure good control continue current measures The patient was seen in followup for chronic pain. A review over at their current pain status was discussed. Drug registry was checked. Prescriptions were given. Discussion was held regarding the importance of compliance with medication as well as pain medication contract.  Time for questions regarding pain management plan occurred. Importance of regular followup visits was discussed. Patient was informed that medication may cause drowsiness and should not be combined  with other medications/alcohol or street drugs. Patient was cautioned that medication could cause drowsiness. If the patient feels medication is causing altered alertness then do not drive or  operate dangerous equipment.

## 2018-08-30 NOTE — Progress Notes (Signed)
Labs mailed to patient with directions to have labs drawn in later summer

## 2018-09-12 ENCOUNTER — Encounter (HOSPITAL_COMMUNITY): Payer: Self-pay | Admitting: Nurse Practitioner

## 2018-09-12 ENCOUNTER — Other Ambulatory Visit: Payer: Self-pay

## 2018-09-12 ENCOUNTER — Ambulatory Visit (HOSPITAL_COMMUNITY)
Admission: RE | Admit: 2018-09-12 | Discharge: 2018-09-12 | Disposition: A | Discharge status: Home / Self Care | Payer: PPO | Source: Ambulatory Visit | Attending: Nurse Practitioner | Admitting: Nurse Practitioner

## 2018-09-12 VITALS — BP 130/65 | HR 68 | Ht 66.0 in

## 2018-09-12 DIAGNOSIS — I10 Essential (primary) hypertension: Secondary | ICD-10-CM

## 2018-09-12 DIAGNOSIS — I480 Paroxysmal atrial fibrillation: Secondary | ICD-10-CM | POA: Diagnosis not present

## 2018-09-12 NOTE — Progress Notes (Signed)
Electrophysiology TeleHealth Note   Due to national recommendations of social distancing due to [place] 19, Audio/video telehealth visit is felt to be most appropriate for this patient at this time.  See MyChart message/consent below from today for patient consent regarding telehealth for the [facility].    Date:  09/12/2018   ID:  [name], DOB 1941/01/24, MRN [id number]  Location: home  Provider location: [address] [place], [place] [address] Evaluation Performed: Follow up  PCP:  Kathyrn Drown, MD  Primary Cardiologist:   Primary Electrophysiologist: Dr. Rayann Heman  CC: afib   History of Present Illness: [name] is a 78 y.o. male who presents via audio  conferencing for a telehealth visit today.    He has a h/o afib that has been very quite over the last couple of years. Since I saw him last he has changed form pradaxa to eliqius for price issues. No bleeding issues.  He reports falling from a ladder last fall and had surgery on his rt knee 2/2 fall in March 2020. He reports that he injured his arms as well  and had had to take pain killers daily since the fall for the residual pain. He also had some melena after the fall which has resolved.  Today, he denies symptoms of palpitations, chest pain, shortness of breath, orthopnea, PND, lower extremity edema, claudication, dizziness, presyncope, syncope, bleeding, or neurologic sequela. The patient is tolerating medications without difficulties and is otherwise without complaint today.   he denies symptoms of cough, fevers, chills, or new SOB worrisome for COVID 19.     Atrial Fibrillation Risk Factors:  he does not have symptoms or diagnosis of sleep apnea. he does not have a history of rheumatic fever. he does have a history of alcohol use. The patient does not have a history of early familial atrial fibrillation or other arrhythmias.  he has a BMI of Body mass index is 30.67 kg/m.Marland Kitchen There were  no vitals filed for this visit.  Past Medical History:  Diagnosis Date  . Arthritis   . Cancer ([place])    skin  . Carpal tunnel syndrome   . Cataract    left eye  . Chronic back pain    multiple back surgeries  . Complication of anesthesia    pt states woke up during hemorroid surgery  . Constipation    Metamucil every mornind  . DVT (deep venous thrombosis) ([place]) 03/2007   left leg   . GERD (gastroesophageal reflux disease)    takes Protonix daily  . Glaucoma   . History of colon polyps   . History of hyperglycemia   . History of MRSA infection 1992  . HTN (hypertension)    takes  Ramipril daily  . Hyperlipidemia    takes Pravastatin 3 times a week  . Joint pain   . Joint swelling   . Nocturia   . Paroxysmal atrial fibrillation (HCC)    takes Pradaxa daily, Chads2vasc score 2(07/11/14)  . Pneumonia    many yrs ago  . Pulmonary embolism ([place]) 2008  . Urinary frequency   . Urinary urgency    Past Surgical History:  Procedure Laterality Date  . BIOPSY  04/01/2018   Procedure: BIOPSY;  Surgeon: Rogene Houston, MD;  [facility];  Service: Endoscopy;;  gastric  . CARDIAC CATHETERIZATION  2008  . CARPAL TUNNEL RELEASE Right   . cataract surgery Right   . cervical and  lumbar surgery    . COLONOSCOPY  04/24/2011   Procedure: COLONOSCOPY;  Surgeon: Rogene Houston, MD;  [facility];  Service: Endoscopy;  Laterality: N/A;  1200  . COLONOSCOPY N/A 08/13/2016   Procedure: COLONOSCOPY;  Surgeon: Rogene Houston, MD;  [facility];  Service: Endoscopy;  Laterality: N/A;  1200  . ESOPHAGOGASTRODUODENOSCOPY  03/10/2012   Procedure: ESOPHAGOGASTRODUODENOSCOPY (EGD);  Surgeon: Rogene Houston, MD;  [facility];  Service: Endoscopy;  Laterality: N/A;  325  . ESOPHAGOGASTRODUODENOSCOPY (EGD) WITH PROPOFOL N/A 04/01/2018   Procedure: ESOPHAGOGASTRODUODENOSCOPY (EGD) WITH PROPOFOL;  Surgeon: Rogene Houston, MD;  [facility];   Service: Endoscopy;  Laterality: N/A;  . HARDWARE REMOVAL N/A 04/24/2014   Procedure: REMOVAL OF SACRAL INSTRUMENTATION WITH [place];  Surgeon: Kristeen Miss, MD;  Location: [facility];  Service: Neurosurgery;  Laterality: N/A;  . HEMORRHOIDECTOMY WITH HEMORRHOID BANDING    . KNEE ARTHROSCOPY Right   . KNEE ARTHROSCOPY WITH MEDIAL MENISECTOMY Right 08/03/2018   Procedure: RIGHT KNEE ARTHROSCOPY WITH PARTIAL MEDIAL MENISCECTOMY;  Surgeon: Leandrew Koyanagi, MD;  Location: [facility];  Service: Orthopedics;  Laterality: Right;  . KNEE SURGERY     left  . LASIK    . POLYPECTOMY  08/13/2016   Procedure: POLYPECTOMY;  Surgeon: Rogene Houston, MD;  [facility];  Service: Endoscopy;;  colon  . PVI  01/24/10   afib ablation     Current Outpatient Medications  Medication Sig Dispense Refill  . amLODipine (NORVASC) 5 MG tablet Take 1 tablet (5 mg total) by mouth daily. 90 tablet 1  . diphenhydrAMINE-APAP, sleep, (TYLENOL PM EXTRA STRENGTH PO) Take 1 tablet by mouth at bedtime as needed (sleep).     Marland Kitchen ELIQUIS 5 MG TABS tablet TAKE 1 TABLET BY MOUTH TWICE A DAY 60 tablet 5  . famotidine (PEPCID AC) 10 MG tablet Take 1 tablet (10 mg total) by mouth at bedtime.    Marland Kitchen HYDROcodone-acetaminophen (NORCO) 5-325 MG tablet Take 1 tablet by mouth every 6 (six) hours as needed. 20 tablet 0  . indapamide (LOZOL) 2.5 MG tablet Take 1 tablet (2.5 mg total) by mouth daily. 90 tablet 1  . latanoprost (XALATAN) 0.005 % ophthalmic solution Place 1 drop into both eyes at bedtime.    . ondansetron (ZOFRAN) 4 MG tablet Take 1-2 tablets (4-8 mg total) by mouth every 8 (eight) hours as needed for nausea or vomiting. 40 tablet 0  . Oxycodone HCl 10 MG TABS One 4 times a day prn pain 120 tablet 0  . pantoprazole (PROTONIX) 40 MG tablet Take 1 tablet (40 mg total) by mouth daily before breakfast. 30 tablet 5  . pravastatin (PRAVACHOL) 80 MG tablet Take 1 tablet (80 mg total) by mouth daily. 90 tablet 1  .  ramipril (ALTACE) 10 MG capsule Take 1 capsule (10 mg total) by mouth 2 (two) times daily. 180 capsule 1   No current facility-administered medications for this encounter.     Allergies:   Sectral [acebutolol hcl]; Sulfonamide derivatives; and Zocor [simvastatin]   Social History:  The patient  reports that he quit smoking about 49 years ago. His smoking use included cigarettes. He has a 0.75 pack-year smoking history. He has never used smokeless tobacco. He reports current alcohol use of about 7.0 standard drinks of alcohol per week. He reports that he does not use drugs.   Family History:  The patient's  family history  includes Colon cancer in his sister; Diabetes in his brother; Healthy in his daughter; Heart disease in his father and another family member; Hypertension in his mother and another family member; Transient ischemic attack in his mother.    ROS:  Please see the history of present illness.   All other systems are personally reviewed and negative.   Exam: NA  Telephone visit  Recent Labs: 11/10/2017: ALT 17 03/28/2018: Hemoglobin 14.3; Platelets 299 08/01/2018: BUN 27; Creatinine, Ser 1.10; Potassium 4.3; Sodium 138  personally reviewed    Other studies personally reviewed: Epic records reviewed     ASSESSMENT AND PLAN:  1.  Paroxysmal  atrial fibrillation afib burden very low No rate control meds on board Continue eliquis 5 mg bid    This patients CHA2DS2-VASc Score and unadjusted Ischemic Stroke Rate (% per year) is equal to 3.2 % stroke rate/year from a score of 3  Above score calculated as 1 point each if present [CHF, HTN, DM, Vascular=MI/PAD/Aortic Plaque, Age if 65-74, or Male] Above score calculated as 2 points each if present [Age > 75, or Stroke/TIA/TE]  2.HTN stable  COVID screen The patient does not have any symptoms that suggest any further testing/ screening at this time.  Social distancing reinforced today.    Follow-up:  One year   Current medicines are reviewed at length with the patient today.   The patient does not have concerns regarding his medicines.  The following changes were made today:  none  Labs/ tests ordered today include: none No orders of the defined types were placed in this encounter.   Patient Risk:  after full review of this patients clinical status, I feel that they are at moderate  risk at this time.   Today, I have spent  20  minutes with the patient with telehealth technology discussing afib/fall and sequelae   Signed, Roderic Palau NP  09/12/2018 12:39 PM  Afib [facility] [place], [place] [address] [phone]   I hereby voluntarily request, consent and authorize the [facility] and its employed or contracted physicians, physician assistants, nurse practitioners or other licensed health care professionals (the Practitioner), to provide me with telemedicine health care services (the "Services") as deemed necessary by the treating Practitioner. I acknowledge and consent to receive the Services by the Practitioner via telemedicine. I understand that the telemedicine visit will involve communicating with the Practitioner through live audiovisual communication technology and the disclosure of certain medical information by electronic transmission. I acknowledge that I have been given the opportunity to request an in-person assessment or other available alternative prior to the telemedicine visit and am voluntarily participating in the telemedicine visit.   I understand that I have the right to withhold or withdraw my consent to the use of telemedicine in the course of my care at any time, without affecting my right to future care or treatment, and that the Practitioner or I may terminate the telemedicine visit at any time. I understand that I have the right to inspect all information obtained and/or recorded in the course of the telemedicine visit and  may receive copies of available information for a reasonable fee.  I understand that some of the potential risks of receiving the Services via telemedicine include:   Delay or interruption in medical evaluation due to technological equipment failure or disruption;  Information transmitted may not be sufficient (e.g. poor resolution of images) to allow for appropriate medical decision making by the Practitioner;  and/or  In rare instances, security protocols could fail, causing a breach of personal health information.   Furthermore, I acknowledge that it is my responsibility to provide information about my medical history, conditions and care that is complete and accurate to the best of my ability. I acknowledge that Practitioner's advice, recommendations, and/or decision may be based on factors not within their control, such as incomplete or inaccurate data provided by me or distortions of diagnostic images or specimens that may result from electronic transmissions. I understand that the practice of medicine is not an exact science and that Practitioner makes no warranties or guarantees regarding treatment outcomes. I acknowledge that I will receive a copy of this consent concurrently upon execution via email to the email address I last provided but may also request a printed copy by calling the office of the [facility].  I understand that my insurance will be billed for this visit.   I have read or had this consent read to me.  I understand the contents of this consent, which adequately explains the benefits and risks of the Services being provided via telemedicine.  I have been provided ample opportunity to ask questions regarding this consent and the Services and have had my questions answered to my satisfaction.  I give my informed consent for the services to be provided through the use of telemedicine in my medical care  By participating in this telemedicine visit I agree to the  above.

## 2018-09-14 ENCOUNTER — Ambulatory Visit (INDEPENDENT_AMBULATORY_CARE_PROVIDER_SITE_OTHER): Payer: PPO | Admitting: Family

## 2018-09-15 ENCOUNTER — Ambulatory Visit (INDEPENDENT_AMBULATORY_CARE_PROVIDER_SITE_OTHER): Payer: PPO | Admitting: Physician Assistant

## 2018-09-15 ENCOUNTER — Other Ambulatory Visit: Payer: Self-pay

## 2018-09-15 ENCOUNTER — Encounter (INDEPENDENT_AMBULATORY_CARE_PROVIDER_SITE_OTHER): Payer: Self-pay | Admitting: Orthopaedic Surgery

## 2018-09-15 DIAGNOSIS — Z9889 Other specified postprocedural states: Secondary | ICD-10-CM

## 2018-09-15 NOTE — Progress Notes (Signed)
Post-Op Visit Note   Patient: [name]           Date of Birth: 1940-06-19           MRN: [id number] Visit Date: 09/15/2018 PCP: Kathyrn Drown, MD   Assessment & Plan:  Chief Complaint:  Chief Complaint  Patient presents with  . Left Knee - Follow-up   Visit Diagnoses:  1. S/P right knee arthroscopy     Plan: Patient is a pleasant 78 year old gentleman who presents our clinic today 43 days status post right knee arthroscopic debridement partial medial meniscectomy, date of surgery 08/03/2018.  It was noted during operative intervention he had grade 4 changes medial and patellofemoral compartments as well as grade 2 changes lateral compartment.  He has had continued achiness and swelling, although he does note improvement on a daily basis.  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  No fevers or chills.  Examination of his right knee reveals a trace effusion.  Range of motion 0 to 125 degrees.  Medial joint line tenderness.  He is neurovascularly intact distally.  At this point, I discussed at some point in the future that he would be a candidate for a total knee replacement.  He will continue to give this at least 6 weeks longer before deciding what he wants to do.  He will follow-up with [place] as needed.  Follow-Up Instructions: Return if symptoms worsen or fail to improve.   Orders:  No orders of the defined types were placed in this encounter.  No orders of the defined types were placed in this encounter.   Imaging: No new imaging  PMFS History: Patient Active Problem List   Diagnosis Date Noted  . S/P right knee arthroscopy 08/10/2018  . Acute tear lateral meniscus, right, initial encounter 08/03/2018  . Acute medial meniscus tear, right, subsequent encounter 07/13/2018  . Melena 03/28/2018  . Closed fracture of lateral portion of left tibial plateau 03/09/2018  . Aortic atherosclerosis ([place])  11/05/2016  . History of colonic polyps 06/16/2016  . Pseudoarthrosis of lumbar spine 04/24/2014  . Hx of adenomatous colonic polyps 11/22/2013  . Prediabetes 10/12/2013  . Fatigue 12/21/2012  . Snoring 12/21/2012  . Chronic back pain greater than 3 months duration 10/31/2012  . Sinusitis 02/22/2012  . GERD (gastroesophageal reflux disease) 02/22/2012  . Hyperlipidemia 01/21/2012  . HYPERTENSION, BENIGN 12/06/2009  . ATRIAL FIBRILLATION, PAROXYSMAL 12/06/2009   Past Medical History:  Diagnosis Date  . Arthritis   . Cancer ([place])    skin  . Carpal tunnel syndrome   . Cataract    left eye  . Chronic back pain    multiple back surgeries  . Complication of anesthesia    pt states woke up during hemorroid surgery  . Constipation    Metamucil every mornind  . DVT (deep venous thrombosis) ([place]) 03/2007   left leg   . GERD (gastroesophageal reflux disease)    takes Protonix daily  . Glaucoma   . History of colon polyps   . History of hyperglycemia   . History of MRSA infection 1992  . HTN (hypertension)    takes  Ramipril daily  . Hyperlipidemia    takes Pravastatin 3 times a week  . Joint pain   . Joint swelling   . Nocturia   . Paroxysmal atrial fibrillation (HCC)    takes Pradaxa daily, Chads2vasc score 2(07/11/14)  .  Pneumonia    many yrs ago  . Pulmonary embolism ([place]) 2008  . Urinary frequency   . Urinary urgency     Family History  Problem Relation Age of Onset  . Hypertension Mother   . Transient ischemic attack Mother   . Heart disease Father   . Diabetes Brother   . Healthy Daughter   . Colon cancer Sister   . Hypertension Other   . Heart disease Other     Past Surgical History:  Procedure Laterality Date  . BIOPSY  04/01/2018   Procedure: BIOPSY;  Surgeon: Rogene Houston, MD;  [facility];  Service: Endoscopy;;  gastric  . CARDIAC CATHETERIZATION  2008  . CARPAL TUNNEL RELEASE Right   . cataract surgery Right   . cervical and lumbar  surgery    . COLONOSCOPY  04/24/2011   Procedure: COLONOSCOPY;  Surgeon: Rogene Houston, MD;  [facility];  Service: Endoscopy;  Laterality: N/A;  1200  . COLONOSCOPY N/A 08/13/2016   Procedure: COLONOSCOPY;  Surgeon: Rogene Houston, MD;  [facility];  Service: Endoscopy;  Laterality: N/A;  1200  . ESOPHAGOGASTRODUODENOSCOPY  03/10/2012   Procedure: ESOPHAGOGASTRODUODENOSCOPY (EGD);  Surgeon: Rogene Houston, MD;  [facility];  Service: Endoscopy;  Laterality: N/A;  325  . ESOPHAGOGASTRODUODENOSCOPY (EGD) WITH PROPOFOL N/A 04/01/2018   Procedure: ESOPHAGOGASTRODUODENOSCOPY (EGD) WITH PROPOFOL;  Surgeon: Rogene Houston, MD;  [facility];  Service: Endoscopy;  Laterality: N/A;  . HARDWARE REMOVAL N/A 04/24/2014   Procedure: REMOVAL OF SACRAL INSTRUMENTATION WITH [place];  Surgeon: Kristeen Miss, MD;  Location: [facility];  Service: Neurosurgery;  Laterality: N/A;  . HEMORRHOIDECTOMY WITH HEMORRHOID BANDING    . KNEE ARTHROSCOPY Right   . KNEE ARTHROSCOPY WITH MEDIAL MENISECTOMY Right 08/03/2018   Procedure: RIGHT KNEE ARTHROSCOPY WITH PARTIAL MEDIAL MENISCECTOMY;  Surgeon: Leandrew Koyanagi, MD;  Location: [facility];  Service: Orthopedics;  Laterality: Right;  . KNEE SURGERY     left  . LASIK    . POLYPECTOMY  08/13/2016   Procedure: POLYPECTOMY;  Surgeon: Rogene Houston, MD;  [facility];  Service: Endoscopy;;  colon  . PVI  01/24/10   afib ablation   Social History   Occupational History  . Occupation: retired  Tobacco Use  . Smoking status: Former Smoker    Packs/day: 0.25    Years: 3.00    Pack years: 0.75    Types: Cigarettes    Last attempt to quit: 03/30/1969    Years since quitting: 49.4  . Smokeless tobacco: Never Used  Substance and Sexual Activity  . Alcohol use: Yes    Alcohol/week: 7.0 standard drinks    Types: 7 Glasses of wine per week    Comment: wine nightly  . Drug use: No  . Sexual  activity: Yes

## 2018-10-11 ENCOUNTER — Telehealth (HOSPITAL_COMMUNITY): Payer: Self-pay | Admitting: *Deleted

## 2018-10-11 MED ORDER — DILTIAZEM HCL 30 MG PO TABS: ORAL_TABLET | ORAL | 1 refills | Status: DC

## 2018-10-11 NOTE — Telephone Encounter (Signed)
Patient called in stating he was in afib about 7 days. Today was the first day feeling normal and back in rhythm. He has no rate control meds in place - discussed with Roderic Palau NP - will call in cardizem 15mg  every 4 hours as needed for afib. Pt in agreement.

## 2018-10-25 ENCOUNTER — Ambulatory Visit (HOSPITAL_COMMUNITY)
Admission: RE | Admit: 2018-10-25 | Discharge: 2018-10-25 | Disposition: A | Discharge status: Home / Self Care | Payer: PPO | Source: Ambulatory Visit | Attending: Nurse Practitioner | Admitting: Nurse Practitioner

## 2018-10-25 ENCOUNTER — Encounter (HOSPITAL_COMMUNITY): Payer: Self-pay | Admitting: Nurse Practitioner

## 2018-10-25 ENCOUNTER — Other Ambulatory Visit: Payer: Self-pay

## 2018-10-25 VITALS — BP 168/80 | HR 65 | Ht 66.0 in | Wt 190.2 lb

## 2018-10-25 DIAGNOSIS — K219 Gastro-esophageal reflux disease without esophagitis: Secondary | ICD-10-CM | POA: Insufficient documentation

## 2018-10-25 DIAGNOSIS — H409 Unspecified glaucoma: Secondary | ICD-10-CM | POA: Diagnosis not present

## 2018-10-25 DIAGNOSIS — R9431 Abnormal electrocardiogram [ECG] [EKG]: Secondary | ICD-10-CM | POA: Diagnosis not present

## 2018-10-25 DIAGNOSIS — Z800 Family history of malignant neoplasm of digestive organs: Secondary | ICD-10-CM | POA: Insufficient documentation

## 2018-10-25 DIAGNOSIS — Z833 Family history of diabetes mellitus: Secondary | ICD-10-CM | POA: Insufficient documentation

## 2018-10-25 DIAGNOSIS — I10 Essential (primary) hypertension: Secondary | ICD-10-CM | POA: Diagnosis not present

## 2018-10-25 DIAGNOSIS — Z87891 Personal history of nicotine dependence: Secondary | ICD-10-CM | POA: Diagnosis not present

## 2018-10-25 DIAGNOSIS — Z79899 Other long term (current) drug therapy: Secondary | ICD-10-CM | POA: Insufficient documentation

## 2018-10-25 DIAGNOSIS — R5383 Other fatigue: Secondary | ICD-10-CM | POA: Diagnosis not present

## 2018-10-25 DIAGNOSIS — Z7901 Long term (current) use of anticoagulants: Secondary | ICD-10-CM | POA: Diagnosis not present

## 2018-10-25 DIAGNOSIS — Z85828 Personal history of other malignant neoplasm of skin: Secondary | ICD-10-CM | POA: Diagnosis not present

## 2018-10-25 DIAGNOSIS — I480 Paroxysmal atrial fibrillation: Secondary | ICD-10-CM

## 2018-10-25 DIAGNOSIS — Z888 Allergy status to other drugs, medicaments and biological substances status: Secondary | ICD-10-CM | POA: Diagnosis not present

## 2018-10-25 DIAGNOSIS — Z882 Allergy status to sulfonamides status: Secondary | ICD-10-CM | POA: Diagnosis not present

## 2018-10-25 DIAGNOSIS — E785 Hyperlipidemia, unspecified: Secondary | ICD-10-CM | POA: Insufficient documentation

## 2018-10-25 DIAGNOSIS — Z86711 Personal history of pulmonary embolism: Secondary | ICD-10-CM | POA: Insufficient documentation

## 2018-10-25 DIAGNOSIS — Z8249 Family history of ischemic heart disease and other diseases of the circulatory system: Secondary | ICD-10-CM | POA: Insufficient documentation

## 2018-10-25 NOTE — Progress Notes (Signed)
Patient ID: [name], male   DOB: [birth year], 78 y.o.   MRN: [id number]     Primary Care Physician: [name] Drown, MD Referring Physician: [name] NAYTHEN [name] is a 78 y.o. male with a h/o PAF, afib ablation 2011. Marland Kitchen No afib other than around 12 hours on Christmas Day, 2017, after hearing about the death of his sister. He continues to take pradaxa on a  regular basis.  F/u 07/15/16. He reports just a few flutters now and then. He remains very active and actually had a rupture of his bicep tendon last September after digging up and pitching a rock about the size of a "coconut.". Surgery was not recommended due to his age and it has repaired to some degree with time. He continues on pradaxa with last bmet drawn 06/02/16 with creatinine of 1.10. No bleeding issues.   F/u afib clinic, 09/08/17. No further afib since December 2017. No issues with anticoagulation. Last bmet with stable creatinine with at 0.96.his only complaint is that he can not do as much work outside as he was doing last Spring." I just give out and have to stop." No chest pain or shortness of breath. Has a freind that felt the same way and he was found to need a stent. His energy improved a lot after the procedure.  Seeing pt in afib clinic 10/25/18  for c/o intermittent palpitations for the  last several weeks and fatigue. I had a virtual visit with pt 4/27 and he did not have these c/o's then. He was telling me about a fall from a ladder in the fall and broke his knee with LOC, "tore up left shoulder." He adds today that he feels that he has not been right since then. 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 I did a stress test lest year, it was read low risk but did have a few areas that ischemia could not be ruled out. He has some chest tightness with irregular heart beat. He is winded with activities. He is in SR today.   Today, he denies symptoms of  palpitations,+ chest pain with palpitations,+ exertional  shortness of breath, orthopnea, PND, lower extremity edema, dizziness, presyncope, syncope, or neurologic sequela. + for exertional fatigue. The patient is tolerating medications without difficulties and is otherwise without complaint today.   Past Medical History:  Diagnosis Date  . Arthritis   . Cancer ([place])    skin  . Carpal tunnel syndrome   . Cataract    left eye  . Chronic back pain    multiple back surgeries  . Complication of anesthesia    pt states woke up during hemorroid surgery  . Constipation    Metamucil every mornind  . DVT (deep venous thrombosis) ([place]) 03/2007   left leg   . GERD (gastroesophageal reflux disease)    takes Protonix daily  . Glaucoma   . History of colon polyps   . History of hyperglycemia   . History of MRSA infection 1992  . HTN (hypertension)    takes  Ramipril daily  . Hyperlipidemia    takes Pravastatin 3 times a week  . Joint pain   . Joint swelling   . Nocturia   . Paroxysmal atrial fibrillation (HCC)    takes Pradaxa daily, Chads2vasc score 2(07/11/14)  . Pneumonia    many yrs ago  .  Pulmonary embolism ([place]) 2008  . Urinary frequency   . Urinary urgency    Past Surgical History:  Procedure Laterality Date  . BIOPSY  04/01/2018   Procedure: BIOPSY;  Surgeon: Rogene Houston, MD;  [facility];  Service: Endoscopy;;  gastric  . CARDIAC CATHETERIZATION  2008  . CARPAL TUNNEL RELEASE Right   . cataract surgery Right   . cervical and lumbar surgery    . COLONOSCOPY  04/24/2011   Procedure: COLONOSCOPY;  Surgeon: Rogene Houston, MD;  [facility];  Service: Endoscopy;  Laterality: N/A;  1200  . COLONOSCOPY N/A 08/13/2016   Procedure: COLONOSCOPY;  Surgeon: Rogene Houston, MD;  [facility];  Service: Endoscopy;  Laterality: N/A;  1200  . ESOPHAGOGASTRODUODENOSCOPY  03/10/2012   Procedure: ESOPHAGOGASTRODUODENOSCOPY (EGD);  Surgeon: Rogene Houston, MD;  [facility];  Service: Endoscopy;  Laterality: N/A;  325  . ESOPHAGOGASTRODUODENOSCOPY (EGD) WITH PROPOFOL N/A 04/01/2018   Procedure: ESOPHAGOGASTRODUODENOSCOPY (EGD) WITH PROPOFOL;  Surgeon: Rogene Houston, MD;  [facility];  Service: Endoscopy;  Laterality: N/A;  . HARDWARE REMOVAL N/A 04/24/2014   Procedure: REMOVAL OF SACRAL INSTRUMENTATION WITH [place];  Surgeon: Kristeen Miss, MD;  Location: [facility];  Service: Neurosurgery;  Laterality: N/A;  . HEMORRHOIDECTOMY WITH HEMORRHOID BANDING    . KNEE ARTHROSCOPY Right   . KNEE ARTHROSCOPY WITH MEDIAL MENISECTOMY Right 08/03/2018   Procedure: RIGHT KNEE ARTHROSCOPY WITH PARTIAL MEDIAL MENISCECTOMY;  Surgeon: Leandrew Koyanagi, MD;  Location: [facility];  Service: Orthopedics;  Laterality: Right;  . KNEE SURGERY     left  . LASIK    . POLYPECTOMY  08/13/2016   Procedure: POLYPECTOMY;  Surgeon: Rogene Houston, MD;  [facility];  Service: Endoscopy;;  colon  . PVI  01/24/10   afib ablation    Current Outpatient Medications  Medication Sig Dispense Refill  . amLODipine (NORVASC) 5 MG tablet Take 1 tablet (5 mg total) by mouth daily. 90 tablet 1  . ELIQUIS 5 MG TABS tablet TAKE 1 TABLET BY MOUTH TWICE A DAY 60 tablet 5  . famotidine (PEPCID AC) 10 MG tablet Take 1 tablet (10 mg total) by mouth at bedtime.    Marland Kitchen HYDROcodone-acetaminophen (NORCO) 5-325 MG tablet Take 1 tablet by mouth every 6 (six) hours as needed. 20 tablet 0  . indapamide (LOZOL) 2.5 MG tablet Take 1 tablet (2.5 mg total) by mouth daily. 90 tablet 1  . Oxycodone HCl 10 MG TABS One 4 times a day prn pain 120 tablet 0  . pantoprazole (PROTONIX) 40 MG tablet Take 1 tablet (40 mg total) by mouth daily before breakfast. 30 tablet 5  . pravastatin (PRAVACHOL) 80 MG tablet Take 1 tablet (80 mg total) by mouth daily. 90 tablet 1  . ramipril (ALTACE) 10 MG capsule Take 1 capsule (10 mg total) by mouth 2 (two) times daily. 180  capsule 1  . diltiazem (CARDIZEM) 30 MG tablet Take 1/2 tablet every 4 hours AS NEEDED for afib 45 tablet 1  . diphenhydrAMINE-APAP, sleep, (TYLENOL PM EXTRA STRENGTH PO) Take 1 tablet by mouth at bedtime as needed (sleep).     . latanoprost (XALATAN) 0.005 % ophthalmic solution Place 1 drop into both eyes at bedtime.     No current facility-administered medications for this encounter.     Allergies  Allergen Reactions  . Sectral [Acebutolol Hcl] Other (See Comments)    Side  effects  . Sulfonamide Derivatives Other (See Comments)    Runs blood pressure up  . Zocor [Simvastatin] Other (See Comments)    Joint pain    Social History   Socioeconomic History  . Marital status: Married    Spouse name: Not on file  . Number of children: Not on file  . Years of education: Not on file  . Highest education level: Not on file  Occupational History  . Occupation: retired  Scientific laboratory technician  . Financial resource strain: Not on file  . Food insecurity:    Worry: Not on file    Inability: Not on file  . Transportation needs:    Medical: Not on file    Non-medical: Not on file  Tobacco Use  . Smoking status: Former Smoker    Packs/day: 0.25    Years: 3.00    Pack years: 0.75    Types: Cigarettes    Last attempt to quit: 03/30/1969    Years since quitting: 49.6  . Smokeless tobacco: Never Used  Substance and Sexual Activity  . Alcohol use: Yes    Alcohol/week: 7.0 standard drinks    Types: 7 Glasses of wine per week    Comment: wine nightly  . Drug use: No  . Sexual activity: Yes  Lifestyle  . Physical activity:    Days per week: Not on file    Minutes per session: Not on file  . Stress: Not on file  Relationships  . Social connections:    Talks on phone: Not on file    Gets together: Not on file    Attends religious service: Not on file    Active member of club or organization: Not on file    Attends meetings of clubs or organizations: Not on file    Relationship status:  Not on file  . Intimate partner violence:    Fear of current or ex partner: Not on file    Emotionally abused: Not on file    Physically abused: Not on file    Forced sexual activity: Not on file  Other Topics Concern  . Not on file  Social History Narrative  . Not on file    Family History  Problem Relation Age of Onset  . Hypertension Mother   . Transient ischemic attack Mother   . Heart disease Father   . Diabetes Brother   . Healthy Daughter   . Colon cancer Sister   . Hypertension Other   . Heart disease Other     ROS- All systems are reviewed and negative except as per the HPI above  Physical Exam: There were no vitals filed for this visit.  GEN- The patient is well appearing, alert and oriented x 3 today.   Head- normocephalic, atraumatic Eyes-  Sclera clear, conjunctiva pink Ears- hearing intact Oropharynx- clear Neck- supple, no JVP Lymph- no cervical lymphadenopathy Lungs- Clear to ausculation bilaterally, normal work of breathing Heart- Regular rate and rhythm, no murmurs, rubs or gallops, PMI not laterally displaced GI- soft, NT, ND, + BS Extremities- no clubbing, cyanosis, or edema MS- no significant deformity or atrophy Skin- no rash or lesion Psych- euthymic mood, full affect Neuro- strength and sensation are intact  EKG- NSR at 65 bpm cannot rule out Inferior MI pr int 173 ms, qrs int 88 ms, qtc 418 ms Cannot rule out inferior MI  Epic records reviewed  Assessment and Plan: 1. Afib  He feels like increase he is having increase in  afib burden, but some of his descriptions sounds like premature contractions Has cardizem 30 mg to use as needed Will  place 1 week Zio patch Continue pradaxa 150 mg bid  Chadsvasc score of at least 3  2. Exertional fatigue Pt had c/o  of this last fall but feels it is worse. EKG cannot r/o old inferior MI  Possible anginal equivalent Will order a lexi myoview Update echo    Will review with pt when test results  are in   [place]. Zoelle Markus, [facility] [address] [place], [place] [address] [phone]

## 2018-10-27 ENCOUNTER — Telehealth (HOSPITAL_COMMUNITY): Payer: Self-pay

## 2018-10-27 NOTE — Telephone Encounter (Signed)
Instructions for his stress test left on the patient's answering machine. Asked to call back with any questions. S.Catrice Zuleta EMTP

## 2018-10-29 DIAGNOSIS — T1512XA Foreign body in conjunctival sac, left eye, initial encounter: Secondary | ICD-10-CM | POA: Diagnosis not present

## 2018-10-29 DIAGNOSIS — H401134 Primary open-angle glaucoma, bilateral, indeterminate stage: Secondary | ICD-10-CM | POA: Diagnosis not present

## 2018-10-29 DIAGNOSIS — H02831 Dermatochalasis of right upper eyelid: Secondary | ICD-10-CM | POA: Diagnosis not present

## 2018-10-29 DIAGNOSIS — H02832 Dermatochalasis of right lower eyelid: Secondary | ICD-10-CM | POA: Diagnosis not present

## 2018-11-01 ENCOUNTER — Ambulatory Visit (HOSPITAL_COMMUNITY): Payer: PPO | Attending: Cardiology

## 2018-11-01 ENCOUNTER — Other Ambulatory Visit: Payer: Self-pay

## 2018-11-01 DIAGNOSIS — R9431 Abnormal electrocardiogram [ECG] [EKG]: Secondary | ICD-10-CM | POA: Insufficient documentation

## 2018-11-01 LAB — MYOCARDIAL PERFUSION IMAGING
LV dias vol: 96 mL (ref 62–150)
LV sys vol: 35 mL
Peak HR: 80 {beats}/min
Rest HR: 59 {beats}/min
SDS: 1
SRS: 0
SSS: 1
TID: 1.01

## 2018-11-01 MED ORDER — TECHNETIUM TC 99M TETROFOSMIN IV KIT
10.3000 | PACK | Freq: Once | INTRAVENOUS | Status: AC | PRN
  Administered 2018-11-01: 10.3 via INTRAVENOUS
  Filled 2018-11-01: qty 11

## 2018-11-01 MED ORDER — TECHNETIUM TC 99M TETROFOSMIN IV KIT
30.7000 | PACK | Freq: Once | INTRAVENOUS | Status: AC | PRN
  Administered 2018-11-01: 30.7 via INTRAVENOUS
  Filled 2018-11-01: qty 31

## 2018-11-01 MED ORDER — REGADENOSON 0.4 MG/5ML IV SOLN
0.4000 mg | Freq: Once | INTRAVENOUS | Status: AC
  Administered 2018-11-01: 0.4 mg via INTRAVENOUS

## 2018-11-02 ENCOUNTER — Telehealth (HOSPITAL_COMMUNITY): Payer: Self-pay | Admitting: Nurse Practitioner

## 2018-11-07 ENCOUNTER — Ambulatory Visit (HOSPITAL_COMMUNITY): Payer: PPO | Attending: Cardiovascular Disease

## 2018-11-07 ENCOUNTER — Other Ambulatory Visit: Payer: Self-pay

## 2018-11-07 DIAGNOSIS — I480 Paroxysmal atrial fibrillation: Secondary | ICD-10-CM | POA: Insufficient documentation

## 2018-12-03 ENCOUNTER — Other Ambulatory Visit: Payer: Self-pay | Admitting: Family Medicine

## 2018-12-06 ENCOUNTER — Ambulatory Visit (INDEPENDENT_AMBULATORY_CARE_PROVIDER_SITE_OTHER): Payer: PPO | Admitting: Family Medicine

## 2018-12-06 ENCOUNTER — Other Ambulatory Visit: Payer: Self-pay

## 2018-12-06 DIAGNOSIS — R5383 Other fatigue: Secondary | ICD-10-CM | POA: Diagnosis not present

## 2018-12-06 DIAGNOSIS — M549 Dorsalgia, unspecified: Secondary | ICD-10-CM | POA: Diagnosis not present

## 2018-12-06 DIAGNOSIS — Z79899 Other long term (current) drug therapy: Secondary | ICD-10-CM

## 2018-12-06 DIAGNOSIS — I480 Paroxysmal atrial fibrillation: Secondary | ICD-10-CM | POA: Diagnosis not present

## 2018-12-06 DIAGNOSIS — E7849 Other hyperlipidemia: Secondary | ICD-10-CM

## 2018-12-06 DIAGNOSIS — I10 Essential (primary) hypertension: Secondary | ICD-10-CM | POA: Diagnosis not present

## 2018-12-06 DIAGNOSIS — G8929 Other chronic pain: Secondary | ICD-10-CM | POA: Diagnosis not present

## 2018-12-06 DIAGNOSIS — I700 Atherosclerosis of aorta: Secondary | ICD-10-CM

## 2018-12-06 DIAGNOSIS — R7303 Prediabetes: Secondary | ICD-10-CM | POA: Diagnosis not present

## 2018-12-06 MED ORDER — APIXABAN 5 MG PO TABS: 5.0000 mg | ORAL_TABLET | Freq: Two times a day (BID) | ORAL | 5 refills | Status: DC

## 2018-12-06 MED ORDER — OXYCODONE HCL 10 MG PO TABS: ORAL_TABLET | ORAL | 0 refills | Status: DC

## 2018-12-06 MED ORDER — INDAPAMIDE 2.5 MG PO TABS: 2.5000 mg | ORAL_TABLET | Freq: Every day | ORAL | 1 refills | Status: DC

## 2018-12-06 MED ORDER — AMLODIPINE BESYLATE 5 MG PO TABS: 5.0000 mg | ORAL_TABLET | Freq: Every day | ORAL | 1 refills | Status: DC

## 2018-12-06 MED ORDER — RAMIPRIL 10 MG PO CAPS: 10.0000 mg | ORAL_CAPSULE | Freq: Two times a day (BID) | ORAL | 1 refills | Status: DC

## 2018-12-06 MED ORDER — PANTOPRAZOLE SODIUM 40 MG PO TBEC: 40.0000 mg | DELAYED_RELEASE_TABLET | Freq: Every day | ORAL | 1 refills | Status: DC

## 2018-12-06 MED ORDER — PRAVASTATIN SODIUM 80 MG PO TABS: 80.0000 mg | ORAL_TABLET | Freq: Every day | ORAL | 1 refills | Status: DC

## 2018-12-06 NOTE — Progress Notes (Signed)
Subjective:    Patient ID: [name], male    DOB: Oct 11, 1940, 78 y.o.   MRN: [id number]  tlephone only Not able to do video HPI  This patient was seen today for chronic pain  The medication list was reviewed and updated.   -Compliance with medication: yes  - Number patient states they take daily: 3  -when was the last dose patient took? today  The patient was advised the importance of maintaining medication and not using illegal substances with these.  Here for refills and follow up  The patient was educated that we can provide 3 monthly scripts for their medication, it is their responsibility to follow the instructions.  Side effects or complications from medications: none  Patient is aware that pain medications are meant to minimize the severity of the pain to allow their pain levels to improve to allow for better function. They are aware of that pain medications cannot totally remove their pain.  Virtual Visit via Video Note  I connected with [name] on 12/06/18 at 10:00 AM EDT by a video enabled telemedicine application and verified that I am speaking with the correct person using two identifiers.  Location: Patient: home Provider: office   I discussed the limitations of evaluation and management by telemedicine and the availability of in person appointments. The patient expressed understanding and agreed to proceed.  History of Present Illness:    Observations/Objective:   Assessment and Plan:   Follow Up Instructions:    I discussed the assessment and treatment plan with the patient. The patient was provided an opportunity to ask questions and all were answered. The patient agreed with the plan and demonstrated an understanding of the instructions.   The patient was advised to call back or seek an in-person evaluation if the symptoms worsen or if the condition fails to improve as anticipated.  I provided 15 minutes of non-face-to-face time  during this encounter.           Review of Systems  Constitutional: Negative for activity change, fatigue and fever.  HENT: Negative for congestion and rhinorrhea.   Respiratory: Negative for cough and shortness of breath.   Cardiovascular: Negative for chest pain and leg swelling.  Gastrointestinal: Negative for abdominal pain, diarrhea and nausea.  Genitourinary: Negative for dysuria and hematuria.  Neurological: Negative for weakness and headaches.  Psychiatric/Behavioral: Negative for agitation and behavioral problems.       Objective:   Physical Exam   Today's visit was via telephone Physical exam was not possible for this visit       Assessment & Plan:  1. Aortic atherosclerosis ([place]) Aortic atherosclerosis does good job keeping cholesterol under control watch diet continue medications  2. Other fatigue Significant fatigue on blood thinner need to make sure he is not getting anemic check CBC - CBC with Differential/Platelet  3. Other hyperlipidemia Hyperlipidemia does a good job with diet exercise medication check lab work - Lipid panel  4. Chronic back pain greater than 3 months duration Chronic pain medicine does a good job keeping pain under control takes anywhere between 2 and 4/day The patient was seen in followup for chronic pain. A review over at their current pain status was discussed. Drug registry was checked. Prescriptions were given. Discussion was held regarding the importance of compliance with medication as well as pain medication contract.  Time for questions regarding pain management plan occurred. Importance of regular followup visits was discussed. Patient was informed that  medication may cause drowsiness and should not be combined  with other medications/alcohol or street drugs. Patient was cautioned that medication could cause drowsiness. If the patient feels medication is causing altered alertness then do not drive or operate dangerous  equipment.    5. Paroxysmal atrial fibrillation (HCC) Atrial fib intermittent under good control is on blood thinner continue this  6. Prediabetes Has prediabetes check metabolic profile minimize starches in diet - Basic metabolic panel  7. HYPERTENSION, BENIGN Blood pressure under good control per patient  Follow-up within 3 months  8. High risk medication use Check liver function - Hepatic function panel

## 2018-12-08 ENCOUNTER — Ambulatory Visit (INDEPENDENT_AMBULATORY_CARE_PROVIDER_SITE_OTHER): Payer: PPO | Admitting: Internal Medicine

## 2018-12-08 ENCOUNTER — Encounter (INDEPENDENT_AMBULATORY_CARE_PROVIDER_SITE_OTHER): Payer: Self-pay | Admitting: Internal Medicine

## 2018-12-08 ENCOUNTER — Other Ambulatory Visit: Payer: Self-pay

## 2018-12-08 VITALS — BP 181/75 | HR 60 | Temp 98.2°F | Ht 66.0 in | Wt 188.0 lb

## 2018-12-08 DIAGNOSIS — K921 Melena: Secondary | ICD-10-CM | POA: Diagnosis not present

## 2018-12-08 NOTE — Progress Notes (Addendum)
Subjective:    Patient ID: [name], male    DOB: Mar 08, 1941, 78 y.o.   MRN: [id number]    HPI  [name] is a 78 y.o. male with a past medical history of  atrial fibrillation on Eliquis, hypertension, hyperlipidemia, arthritis, PE and left LE DVT 2008, GERD, colon polyps, chronic back pain requiring multiple back surgeries. Underwent an EGD in November for melena.  Gastric polyps are fundic gland polyps. Negative for EoE. States he is doing good. No GI pain. His BMs are brown. No melena. Appetite is okay. No weight loss.    EGD with MAC by Dr. Hildred Laser 04/01/2018: The proximal esophagus was normal. Diffuse mild mucosal changes characterized by longitudinal markings and circumferential folds were found in the mid esophagus and in the distal  esophagus. 2cm HH. Multiple small pedunculated and sessile polyps with no bleeding and no stigmata of recent bleeding were found in the gastric fundus and in the gastric body. Biopsies consistent with fundic gland polyps. No evidence of H. Pylori.  Colonoscopy 08/13/2016 by Dr. Hildred Laser: One 33m tubular adenomatous polyp at the hepatic flexure, two small tubular adenomatous polyps in the transverse colon and two small tubular adenomatous polyps in the transverse colon were removed. Diverticulosis in the sigmoid colon and internal hemorrhoids. Recall colonoscopy 562yr    ECHO 11/07/18: LV ejection fraction of 60-65%                                                          Past Medical History:  Diagnosis Date  . Arthritis   . [place] ([place]   skin  . Carpal tunnel syndrome   . Cataract    left eye  . Chronic back pain    multiple back surgeries  . Complication of anesthesia    pt states woke up during hemorroid surgery  . Constipation    Metamucil every mornind  . [place]) [place]   left leg   . GERD (gastroesophageal reflux disease)    takes Protonix daily  . Glaucoma   . History of colon polyps    . History of hyperglycemia   . History of MRSA infection 1992  . HTN (hypertension)    takes  Ramipril daily  . Hyperlipidemia    takes Pravastatin 3 times a week  . Joint pain   . Joint swelling   . Nocturia   . Paroxysmal atrial fibrillation (HCC)    takes Pradaxa daily, Chads2vasc score 2(07/11/14)  . Pneumonia    many yrs ago  . [place] ([place]  . Urinary frequency   . Urinary urgency     Current Outpatient Medications on File Prior to Visit  Medication Sig Dispense Refill  . amLODipine (NORVASC) 5 MG tablet Take 1 tablet (5 mg total) by mouth daily. 90 tablet 1  . apixaban (ELIQUIS) 5 MG TABS tablet Take 1 tablet (5 mg total) by mouth 2 (two) times daily. 60 tablet 5  . diltiazem (CARDIZEM) 30 MG tablet Take 1/2 tablet every 4 hours AS NEEDED for afib 45 tablet 1  . diphenhydrAMINE-APAP, sleep, (TYLENOL PM EXTRA STRENGTH PO) Take 1 tablet by mouth at bedtime as needed (sleep).     . famotidine (PEPCID AC) 10 MG tablet Take 1 tablet (  10 mg total) by mouth at bedtime.    . indapamide (LOZOL) 2.5 MG tablet Take 1 tablet (2.5 mg total) by mouth daily. 90 tablet 1  . latanoprost (XALATAN) 0.005 % ophthalmic solution Place 1 drop into both eyes at bedtime.    . Oxycodone HCl 10 MG TABS One 4 times a day prn pain 120 tablet 0  . Oxycodone HCl 10 MG TABS 1 taken 4 times daily as needed for pain 120 tablet 0  . Oxycodone HCl 10 MG TABS 1 taken 4 times daily as needed for pain 120 tablet 0  . pantoprazole (PROTONIX) 40 MG tablet Take 1 tablet (40 mg total) by mouth daily before breakfast. 90 tablet 1  . pravastatin (PRAVACHOL) 80 MG tablet Take 1 tablet (80 mg total) by mouth daily. 90 tablet 1  . ramipril (ALTACE) 10 MG capsule Take 1 capsule (10 mg total) by mouth 2 (two) times daily. 180 capsule 1   No current facility-administered medications on file prior to visit.      Past Medical History:  Diagnosis Date  . Arthritis   . Cancer ([place])    skin  . Carpal  tunnel syndrome   . Cataract    left eye  . Chronic back pain    multiple back surgeries  . Complication of anesthesia    pt states woke up during hemorroid surgery  . Constipation    Metamucil every mornind  . DVT (deep venous thrombosis) ([place]) 03/2007   left leg   . GERD (gastroesophageal reflux disease)    takes Protonix daily  . Glaucoma   . History of colon polyps   . History of hyperglycemia   . History of MRSA infection 1992  . HTN (hypertension)    takes  Ramipril daily  . Hyperlipidemia    takes Pravastatin 3 times a week  . Joint pain   . Joint swelling   . Nocturia   . Paroxysmal atrial fibrillation (HCC)    takes Pradaxa daily, Chads2vasc score 2(07/11/14)  . Pneumonia    many yrs ago  . Pulmonary embolism ([place]) 2008  . Urinary frequency   . Urinary urgency     Past Surgical History:  Procedure Laterality Date  . BIOPSY  04/01/2018   Procedure: BIOPSY;  Surgeon: Rogene Houston, MD;  [facility];  Service: Endoscopy;;  gastric  . CARDIAC CATHETERIZATION  2008  . CARPAL TUNNEL RELEASE Right   . cataract surgery Right   . cervical and lumbar surgery    . COLONOSCOPY  04/24/2011   Procedure: COLONOSCOPY;  Surgeon: Rogene Houston, MD;  [facility];  Service: Endoscopy;  Laterality: N/A;  1200  . COLONOSCOPY N/A 08/13/2016   Procedure: COLONOSCOPY;  Surgeon: Rogene Houston, MD;  [facility];  Service: Endoscopy;  Laterality: N/A;  1200  . ESOPHAGOGASTRODUODENOSCOPY  03/10/2012   Procedure: ESOPHAGOGASTRODUODENOSCOPY (EGD);  Surgeon: Rogene Houston, MD;  [facility];  Service: Endoscopy;  Laterality: N/A;  325  . ESOPHAGOGASTRODUODENOSCOPY (EGD) WITH PROPOFOL N/A 04/01/2018   Procedure: ESOPHAGOGASTRODUODENOSCOPY (EGD) WITH PROPOFOL;  Surgeon: Rogene Houston, MD;  [facility];  Service: Endoscopy;  Laterality: N/A;  . HARDWARE REMOVAL N/A 04/24/2014   Procedure: REMOVAL OF SACRAL INSTRUMENTATION WITH  [place];  Surgeon: Kristeen Miss, MD;  Location: [place] [facility];  Service: Neurosurgery;  Laterality: N/A;  . HEMORRHOIDECTOMY WITH HEMORRHOID BANDING    . KNEE ARTHROSCOPY Right   .  KNEE ARTHROSCOPY WITH MEDIAL MENISECTOMY Right 08/03/2018   Procedure: RIGHT KNEE ARTHROSCOPY WITH PARTIAL MEDIAL MENISCECTOMY;  Surgeon: Leandrew Koyanagi, MD;  Location: [facility];  Service: Orthopedics;  Laterality: Right;  . KNEE SURGERY     left  . LASIK    . POLYPECTOMY  08/13/2016   Procedure: POLYPECTOMY;  Surgeon: Rogene Houston, MD;  [facility];  Service: Endoscopy;;  colon  . PVI  01/24/10   afib ablation    Allergies  Allergen Reactions  . Sectral [Acebutolol Hcl] Other (See Comments)    Side effects  . Sulfonamide Derivatives Other (See Comments)    Runs blood pressure up  . Zocor [Simvastatin] Other (See Comments)    Joint pain    Current Outpatient Medications on File Prior to Visit  Medication Sig Dispense Refill  . amLODipine (NORVASC) 5 MG tablet Take 1 tablet (5 mg total) by mouth daily. 90 tablet 1  . apixaban (ELIQUIS) 5 MG TABS tablet Take 1 tablet (5 mg total) by mouth 2 (two) times daily. 60 tablet 5  . diltiazem (CARDIZEM) 30 MG tablet Take 1/2 tablet every 4 hours AS NEEDED for afib 45 tablet 1  . diphenhydrAMINE-APAP, sleep, (TYLENOL PM EXTRA STRENGTH PO) Take 1 tablet by mouth at bedtime as needed (sleep).     . famotidine (PEPCID AC) 10 MG tablet Take 1 tablet (10 mg total) by mouth at bedtime.    . indapamide (LOZOL) 2.5 MG tablet Take 1 tablet (2.5 mg total) by mouth daily. 90 tablet 1  . latanoprost (XALATAN) 0.005 % ophthalmic solution Place 1 drop into both eyes at bedtime.    . Oxycodone HCl 10 MG TABS One 4 times a day prn pain 120 tablet 0  . Oxycodone HCl 10 MG TABS 1 taken 4 times daily as needed for pain 120 tablet 0  . Oxycodone HCl 10 MG TABS 1 taken 4 times daily as needed for pain 120 tablet 0  . pantoprazole (PROTONIX) 40 MG tablet Take 1  tablet (40 mg total) by mouth daily before breakfast. 90 tablet 1  . pravastatin (PRAVACHOL) 80 MG tablet Take 1 tablet (80 mg total) by mouth daily. 90 tablet 1  . ramipril (ALTACE) 10 MG capsule Take 1 capsule (10 mg total) by mouth 2 (two) times daily. 180 capsule 1   No current facility-administered medications on file prior to visit.            Objective:   Physical Exam Blood pressure (!) 181/75, pulse 60, temperature 98.2 F (36.8 C), height _0  (1.676 m), weight 188 lb (85.3 kg). Alert and oriented. Skin warm and dry. Oral mucosa is moist.   . Sclera anicteric, conjunctivae is pink. Thyroid not enlarged. No cervical lymphadenopathy. Lungs clear. Heart regular rate and rhythm.  Abdomen is soft. Bowel sounds are positive. No hepatomegaly. No abdominal masses felt. No tenderness.  No edema to lower extremities.          Assessment & Plan:  Melena. Stools are normal. Has not seen any black stools in over 7 months. He will try coming off the Protonix and see how he does. He will let us know how he does. He will call. OV in 1 year.

## 2018-12-08 NOTE — Patient Instructions (Signed)
He will stop the Protonix for now. Call and let Dr. Laural Golden or NP know how u are doing. OV in 1 year.

## 2019-01-13 ENCOUNTER — Other Ambulatory Visit: Payer: Self-pay | Admitting: Neurological Surgery

## 2019-01-13 ENCOUNTER — Other Ambulatory Visit (HOSPITAL_COMMUNITY): Payer: Self-pay | Admitting: Neurological Surgery

## 2019-01-13 DIAGNOSIS — M5416 Radiculopathy, lumbar region: Secondary | ICD-10-CM

## 2019-01-20 ENCOUNTER — Ambulatory Visit (HOSPITAL_COMMUNITY)
Admission: RE | Admit: 2019-01-20 | Discharge: 2019-01-20 | Disposition: A | Discharge status: Home / Self Care | Payer: PPO | Source: Ambulatory Visit | Attending: Neurological Surgery | Admitting: Neurological Surgery

## 2019-01-20 ENCOUNTER — Other Ambulatory Visit: Payer: Self-pay

## 2019-01-20 ENCOUNTER — Encounter (HOSPITAL_COMMUNITY): Payer: Self-pay

## 2019-01-20 DIAGNOSIS — M5416 Radiculopathy, lumbar region: Secondary | ICD-10-CM

## 2019-01-27 DIAGNOSIS — H401112 Primary open-angle glaucoma, right eye, moderate stage: Secondary | ICD-10-CM | POA: Diagnosis not present

## 2019-01-27 DIAGNOSIS — H2512 Age-related nuclear cataract, left eye: Secondary | ICD-10-CM | POA: Diagnosis not present

## 2019-01-31 ENCOUNTER — Ambulatory Visit (HOSPITAL_COMMUNITY)
Admission: RE | Admit: 2019-01-31 | Discharge: 2019-01-31 | Disposition: A | Discharge status: Home / Self Care | Payer: PPO | Source: Ambulatory Visit | Attending: Neurological Surgery | Admitting: Neurological Surgery

## 2019-01-31 ENCOUNTER — Other Ambulatory Visit: Payer: Self-pay

## 2019-01-31 DIAGNOSIS — M5416 Radiculopathy, lumbar region: Secondary | ICD-10-CM | POA: Diagnosis not present

## 2019-01-31 DIAGNOSIS — M48061 Spinal stenosis, lumbar region without neurogenic claudication: Secondary | ICD-10-CM | POA: Diagnosis not present

## 2019-01-31 DIAGNOSIS — M5126 Other intervertebral disc displacement, lumbar region: Secondary | ICD-10-CM | POA: Diagnosis not present

## 2019-01-31 DIAGNOSIS — M47816 Spondylosis without myelopathy or radiculopathy, lumbar region: Secondary | ICD-10-CM | POA: Diagnosis not present

## 2019-01-31 LAB — POCT I-STAT CREATININE: Creatinine, Ser: 1 mg/dL (ref 0.61–1.24)

## 2019-01-31 MED ORDER — GADOBUTROL 1 MMOL/ML IV SOLN
7.0000 mL | Freq: Once | INTRAVENOUS | Status: AC | PRN
  Administered 2019-01-31: 7 mL via INTRAVENOUS

## 2019-02-01 DIAGNOSIS — X32XXXD Exposure to sunlight, subsequent encounter: Secondary | ICD-10-CM | POA: Diagnosis not present

## 2019-02-01 DIAGNOSIS — L570 Actinic keratosis: Secondary | ICD-10-CM | POA: Diagnosis not present

## 2019-02-01 DIAGNOSIS — L905 Scar conditions and fibrosis of skin: Secondary | ICD-10-CM | POA: Diagnosis not present

## 2019-02-01 DIAGNOSIS — C44311 Basal cell carcinoma of skin of nose: Secondary | ICD-10-CM | POA: Diagnosis not present

## 2019-02-03 DIAGNOSIS — Z6830 Body mass index (BMI) 30.0-30.9, adult: Secondary | ICD-10-CM | POA: Diagnosis not present

## 2019-02-03 DIAGNOSIS — M5416 Radiculopathy, lumbar region: Secondary | ICD-10-CM | POA: Diagnosis not present

## 2019-02-03 DIAGNOSIS — I10 Essential (primary) hypertension: Secondary | ICD-10-CM | POA: Diagnosis not present

## 2019-02-03 DIAGNOSIS — M25552 Pain in left hip: Secondary | ICD-10-CM | POA: Diagnosis not present

## 2019-02-09 ENCOUNTER — Telehealth: Payer: Self-pay | Admitting: Family Medicine

## 2019-02-09 NOTE — Telephone Encounter (Signed)
Nurses Please let the patient know we received a request from neurosurgery to stop his Eliquis before the steroid injection  It is necessary to stop Eliquis in order to get a steroid injection. It is recommended 2 days before surgery to not take the Eliquis and continue to stay off of Eliquis through the day of surgery If all goes well with the injection he may resume the Eliquis 48 hours after the injection-essentially 2 days afterwards  Please make sure patient understands this.  Also please send preoperative clearance form that was filled out to [place] [facility] thank you

## 2019-02-10 NOTE — Telephone Encounter (Signed)
Pt contacted and verbalized understanding.  

## 2019-02-14 DIAGNOSIS — M5114 Intervertebral disc disorders with radiculopathy, thoracic region: Secondary | ICD-10-CM | POA: Diagnosis not present

## 2019-02-14 DIAGNOSIS — M5414 Radiculopathy, thoracic region: Secondary | ICD-10-CM | POA: Diagnosis not present

## 2019-02-18 ENCOUNTER — Other Ambulatory Visit (INDEPENDENT_AMBULATORY_CARE_PROVIDER_SITE_OTHER): Payer: PPO | Admitting: *Deleted

## 2019-02-18 DIAGNOSIS — Z23 Encounter for immunization: Secondary | ICD-10-CM

## 2019-02-20 DIAGNOSIS — Z79899 Other long term (current) drug therapy: Secondary | ICD-10-CM | POA: Diagnosis not present

## 2019-02-20 DIAGNOSIS — R5383 Other fatigue: Secondary | ICD-10-CM | POA: Diagnosis not present

## 2019-02-20 DIAGNOSIS — R7303 Prediabetes: Secondary | ICD-10-CM | POA: Diagnosis not present

## 2019-02-20 DIAGNOSIS — E7849 Other hyperlipidemia: Secondary | ICD-10-CM | POA: Diagnosis not present

## 2019-02-21 LAB — HEPATIC FUNCTION PANEL
ALT: 23 IU/L (ref 0–44)
AST: 19 IU/L (ref 0–40)
Albumin: 4.8 g/dL — ABNORMAL HIGH (ref 3.7–4.7)
Alkaline Phosphatase: 68 IU/L (ref 39–117)
Bilirubin Total: 0.4 mg/dL (ref 0.0–1.2)
Bilirubin, Direct: 0.11 mg/dL (ref 0.00–0.40)
Total Protein: 7 g/dL (ref 6.0–8.5)

## 2019-02-21 LAB — BASIC METABOLIC PANEL
BUN/Creatinine Ratio: 25 — ABNORMAL HIGH (ref 10–24)
BUN: 28 mg/dL — ABNORMAL HIGH (ref 8–27)
CO2: 26 mmol/L (ref 20–29)
Calcium: 9.8 mg/dL (ref 8.6–10.2)
Chloride: 98 mmol/L (ref 96–106)
Creatinine, Ser: 1.13 mg/dL (ref 0.76–1.27)
GFR calc Af Amer: 72 mL/min/{1.73_m2} (ref >59)
GFR calc non Af Amer: 62 mL/min/{1.73_m2} (ref >59)
Glucose: 101 mg/dL — ABNORMAL HIGH (ref 65–99)
Potassium: 4.6 mmol/L (ref 3.5–5.2)
Sodium: 139 mmol/L (ref 134–144)

## 2019-02-21 LAB — LIPID PANEL
Chol/HDL Ratio: 3.1 ratio (ref 0.0–5.0)
Cholesterol, Total: 157 mg/dL (ref 100–199)
HDL: 51 mg/dL (ref >39)
LDL Chol Calc (NIH): 92 mg/dL (ref 0–99)
Triglycerides: 69 mg/dL (ref 0–149)
VLDL Cholesterol Cal: 14 mg/dL (ref 5–40)

## 2019-02-21 LAB — CBC WITH DIFFERENTIAL/PLATELET
Basophils Absolute: 0 10*3/uL (ref 0.0–0.2)
Basos: 0 %
EOS (ABSOLUTE): 0 10*3/uL (ref 0.0–0.4)
Eos: 0 %
Hematocrit: 43.5 % (ref 37.5–51.0)
Hemoglobin: 14.8 g/dL (ref 13.0–17.7)
Immature Grans (Abs): 0.1 10*3/uL (ref 0.0–0.1)
Immature Granulocytes: 1 %
Lymphocytes Absolute: 1.9 10*3/uL (ref 0.7–3.1)
Lymphs: 21 %
MCH: 32 pg (ref 26.6–33.0)
MCHC: 34 g/dL (ref 31.5–35.7)
MCV: 94 fL (ref 79–97)
Monocytes Absolute: 1 10*3/uL — ABNORMAL HIGH (ref 0.1–0.9)
Monocytes: 11 %
Neutrophils Absolute: 6.2 10*3/uL (ref 1.4–7.0)
Neutrophils: 67 %
Platelets: 312 10*3/uL (ref 150–450)
RBC: 4.63 x10E6/uL (ref 4.14–5.80)
RDW: 12.8 % (ref 11.6–15.4)
WBC: 9.2 10*3/uL (ref 3.4–10.8)

## 2019-03-07 ENCOUNTER — Encounter: Payer: Self-pay | Admitting: Family Medicine

## 2019-03-07 ENCOUNTER — Ambulatory Visit (INDEPENDENT_AMBULATORY_CARE_PROVIDER_SITE_OTHER): Payer: PPO | Admitting: Family Medicine

## 2019-03-07 ENCOUNTER — Other Ambulatory Visit: Payer: Self-pay

## 2019-03-07 VITALS — BP 140/69 | HR 62

## 2019-03-07 DIAGNOSIS — I10 Essential (primary) hypertension: Secondary | ICD-10-CM | POA: Diagnosis not present

## 2019-03-07 DIAGNOSIS — M549 Dorsalgia, unspecified: Secondary | ICD-10-CM | POA: Diagnosis not present

## 2019-03-07 DIAGNOSIS — I480 Paroxysmal atrial fibrillation: Secondary | ICD-10-CM

## 2019-03-07 DIAGNOSIS — K219 Gastro-esophageal reflux disease without esophagitis: Secondary | ICD-10-CM | POA: Diagnosis not present

## 2019-03-07 DIAGNOSIS — G8929 Other chronic pain: Secondary | ICD-10-CM

## 2019-03-07 MED ORDER — OXYCODONE HCL 10 MG PO TABS: ORAL_TABLET | ORAL | 0 refills | Status: DC

## 2019-03-07 NOTE — Progress Notes (Signed)
Subjective:    Patient ID: [name], male    DOB: [birth year], 78 y.o.   MRN: [id number]  HPI   Patient states blood pressure been under good control but he does try to watch his diet tries to stay physically active he states he has not learned how to slow down.  He is always been a busy person  He does have intermittent atrial fibrillation he feels like his heart is in rhythm currently but other times he feels out of rhythm he denies any severe chest tightness pressure pain denies numbness tingling sweats chills fevers  Patient relates having heartburn issues.  Recently gastroenterologist took him off of Protonix He tried Pepcid but that would help He went back to taking Protonix and is also been supplementing with sucralfate.  Patient is on anticoagulant but none denies any recent bleeding issues  This patient was seen today for chronic pain  The medication list was reviewed and updated.   -Compliance with medication: oxycodone 10 mg  BID - Number patient states they take daily: 2-3  -when was the last dose patient took? 7:30 a.m   The patient was advised the importance of maintaining medication and not using illegal substances with these.  Here for refills and follow up  The patient was educated that we can provide 3 monthly scripts for their medication, it is their responsibility to follow the instructions.  Side effects or complications from medications: none  Patient is aware that pain medications are meant to minimize the severity of the pain to allow their pain levels to improve to allow for better function. They are aware of that pain medications cannot totally remove their pain.  Due for UDT ( at least once per year) :   Pt states for the past month, his heart has been out of rhythm. At times he can feel his heart beat in the top of his head. His BP has been going up and down. 180-190/90 then it may be 130/70. Pt states he does believe it is due to stress where he  has a lot going on. Pt took some muscle relaxer that was prescribed by heart doctor a  Few years ago and that did help him some. He states he feels ok at this time.   Virtual Visit via Telephone Note  I connected with [name] on 03/07/19 at 10:00 AM EDT by telephone and verified that I am speaking with the correct person using two identifiers.  Location: Patient: home Provider: office   I discussed the limitations, risks, security and privacy concerns of performing an evaluation and management service by telephone and the availability of in person appointments. I also discussed with the patient that there may be a patient responsible charge related to this service. The patient expressed understanding and agreed to proceed.  Results for orders placed or performed during the hospital encounter of 01/31/19  I-STAT creatinine  Result Value Ref Range   Creatinine, Ser 1.00 0.61 - 1.24 mg/dL    History of Present Illness:    Observations/Objective:   Assessment and Plan:   Follow Up Instructions:    I discussed the assessment and treatment plan with the patient. The patient was provided an opportunity to ask questions and all were answered. The patient agreed with the plan and demonstrated an understanding of the instructions.   The patient was advised to call back or seek an in-person evaluation if the symptoms worsen or if the condition fails to  improve as anticipated.  I provided 25 minutes of non-face-to-face time during this encounter.   [name] Males, LPN      Review of Systems  Constitutional: Negative for activity change.  HENT: Negative for congestion and rhinorrhea.   Respiratory: Negative for cough and shortness of breath.   Cardiovascular: Negative for chest pain.  Gastrointestinal: Negative for abdominal pain, diarrhea, nausea and vomiting.  Genitourinary: Negative for dysuria and hematuria.  Neurological: Negative for weakness and headaches.    Psychiatric/Behavioral: Negative for behavioral problems and confusion.       Objective:   Physical Exam   Today's visit was via telephone Physical exam was not possible for this visit      Assessment & Plan:  1. Gastroesophageal reflux disease without esophagitis Patient with significant reflux issues try to go up Protonix had a lot of problems with it occasionally using soaker fate Because he is on a anticoagulant and has had a history of bleeding I would recommend for the patient to continue Protonix for now  2. HYPERTENSION, BENIGN Blood pressure seemingly under good control occasionally goes up I told him if it keeps giving him trouble he needs to come in and follow-up so we can check blood pressure in person  3. Paroxysmal atrial fibrillation (HCC) He thinks his rhythm is normal right now occasionally goes out does take his anticoagulant to lessen the risk of stroke denies any bleeding issues  4. Chronic back pain greater than 3 months duration Has chronic back pain pain medicine does allow him to function better he denies abusing it drug registry checked  The patient was seen in followup for chronic pain. A review over at their current pain status was discussed. Drug registry was checked. Prescriptions were given. Discussion was held regarding the importance of compliance with medication as well as pain medication contract.  Time for questions regarding pain management plan occurred. Importance of regular followup visits was discussed. Patient was informed that medication may cause drowsiness and should not be combined  with other medications/alcohol or street drugs. Patient was cautioned that medication could cause drowsiness. If the patient feels medication is causing altered alertness then do not drive or operate dangerous equipment.  3 scripts were sent in

## 2019-03-08 DIAGNOSIS — X32XXXD Exposure to sunlight, subsequent encounter: Secondary | ICD-10-CM | POA: Diagnosis not present

## 2019-03-08 DIAGNOSIS — Z08 Encounter for follow-up examination after completed treatment for malignant neoplasm: Secondary | ICD-10-CM | POA: Diagnosis not present

## 2019-03-08 DIAGNOSIS — L570 Actinic keratosis: Secondary | ICD-10-CM | POA: Diagnosis not present

## 2019-03-08 DIAGNOSIS — Z85828 Personal history of other malignant neoplasm of skin: Secondary | ICD-10-CM | POA: Diagnosis not present

## 2019-03-15 ENCOUNTER — Encounter: Payer: Self-pay | Admitting: Family Medicine

## 2019-03-15 ENCOUNTER — Other Ambulatory Visit: Payer: Self-pay

## 2019-03-15 ENCOUNTER — Ambulatory Visit (INDEPENDENT_AMBULATORY_CARE_PROVIDER_SITE_OTHER): Payer: PPO | Admitting: Family Medicine

## 2019-03-15 VITALS — BP 138/72 | Ht 66.0 in | Wt 186.0 lb

## 2019-03-15 DIAGNOSIS — M1990 Unspecified osteoarthritis, unspecified site: Secondary | ICD-10-CM

## 2019-03-15 DIAGNOSIS — R490 Dysphonia: Secondary | ICD-10-CM

## 2019-03-15 DIAGNOSIS — K219 Gastro-esophageal reflux disease without esophagitis: Secondary | ICD-10-CM | POA: Diagnosis not present

## 2019-03-15 MED ORDER — PANTOPRAZOLE SODIUM 40 MG PO TBEC: 40.0000 mg | DELAYED_RELEASE_TABLET | Freq: Every day | ORAL | 1 refills | Status: DC

## 2019-03-15 NOTE — Progress Notes (Signed)
   Subjective:    Patient ID: [name], male    DOB: 10-31-1940, 78 y.o.   MRN: [id number]  HPIReflux. Burning in throat and starting to lose his voice. States dr Laural Golden wanted him to go off of protonix and start nexium. He states this might of started around the time he swallowed some hot coffee and it felt it burning the rest of the day.  Patient states at times his voice gets weak.  Denies any hemoptysis.  Denies any dysphagia.  Patient had endoscopy last year.  Has not seen ENT. Pt states bp has been elevated for the past month and heart goes out of rhythm. States he has seen cardiology and he takes diltiazem 30mg  for afib.   Pt wants to discuss taking tylenol arthritis 2 every day and it seems to help. Wants to know if its safe to take every day.  Has significant osteoarthritis of the knees hips back and hands.  On blood thinner so therefore cannot take anti-inflammatories. Review of Systems  Constitutional: Negative for activity change, fatigue and fever.  HENT: Negative for congestion and rhinorrhea.   Respiratory: Negative for cough and shortness of breath.   Cardiovascular: Negative for chest pain and leg swelling.  Gastrointestinal: Negative for abdominal pain, diarrhea and nausea.  Genitourinary: Negative for dysuria and hematuria.  Neurological: Negative for weakness and headaches.  Psychiatric/Behavioral: Negative for agitation and behavioral problems.       Objective:   Physical Exam Vitals signs reviewed.  Constitutional:      General: He is not in acute distress. HENT:     Head: Normocephalic and atraumatic.  Eyes:     General:        Right eye: No discharge.        Left eye: No discharge.  Neck:     Trachea: No tracheal deviation.  Cardiovascular:     Rate and Rhythm: Normal rate and regular rhythm.     Heart sounds: Normal heart sounds. No murmur.  Pulmonary:     Effort: Pulmonary effort is normal. No respiratory distress.     Breath sounds: Normal  breath sounds.  Lymphadenopathy:     Cervical: No cervical adenopathy.  Skin:    General: Skin is warm and dry.  Neurological:     Mental Status: He is alert.     Coordination: Coordination normal.  Psychiatric:        Behavior: Behavior normal.    Significant osteoarthritis noted of the hands knees hips   15 minutes was spent with patient today discussing healthcare issues which they came.  More than 50% of this visit-total duration of visit-was spent in counseling and coordination of care.  Please see diagnosis regarding the focus of this coordination and care     Assessment & Plan:  Occasional laryngitis probably related to GERD patient will go back on his medicine Protonix on a regular basis he will follow-up in 1 month if he is still having these issues he needs to see ENT I offered ENT referral currently he defers Cardiology standpoint patient overall doing okay  As for his pain he needs to stay away from all NSAIDs I recommend using Tylenol as needed

## 2019-03-22 ENCOUNTER — Telehealth: Payer: Self-pay | Admitting: Family Medicine

## 2019-03-22 ENCOUNTER — Ambulatory Visit: Payer: PPO | Admitting: Family Medicine

## 2019-03-22 ENCOUNTER — Other Ambulatory Visit: Payer: Self-pay | Admitting: Family Medicine

## 2019-03-22 DIAGNOSIS — R490 Dysphonia: Secondary | ICD-10-CM

## 2019-03-22 DIAGNOSIS — I10 Essential (primary) hypertension: Secondary | ICD-10-CM

## 2019-03-22 MED ORDER — SUCRALFATE 1 G PO TABS: ORAL_TABLET | ORAL | 0 refills | Status: DC

## 2019-03-22 NOTE — Telephone Encounter (Signed)
Please advise. Thank you
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
## 2019-03-22 NOTE — Telephone Encounter (Signed)
As we discussed on his last visit it is quite possible that reflux is causing inflammation of his vocal cords  But it is also possible something else could be causing this as well I recommend consultation with ENT Dr Benjamine Mola comes up from [facility] with his local [facility] on [address] does a very good job as well  Please move forward with consult

## 2019-03-22 NOTE — Telephone Encounter (Signed)
Carafate tablets sent to pharmacy and pt verbalized understanding. Pt has set up an appt with [place] office on April 02 2019.

## 2019-03-22 NOTE — Telephone Encounter (Signed)
Patient may take Carafate as well 1 g tablet makes it into some water and drink it 3 times a day He may already have some at home If not you may send in 90 tablets

## 2019-03-22 NOTE — Telephone Encounter (Signed)
Said his throat was getting worse and couldn't talk yesterday.  Talking fine today but he will loose his voice again today.  Was just seen 03/15/19 for this. Now what?

## 2019-03-27 ENCOUNTER — Encounter: Payer: Self-pay | Admitting: Family Medicine

## 2019-03-28 ENCOUNTER — Other Ambulatory Visit: Payer: Self-pay

## 2019-03-28 DIAGNOSIS — Z20822 Contact with and (suspected) exposure to covid-19: Secondary | ICD-10-CM

## 2019-03-30 LAB — NOVEL CORONAVIRUS, NAA: SARS-CoV-2, NAA: NOT DETECTED

## 2019-04-05 ENCOUNTER — Ambulatory Visit (INDEPENDENT_AMBULATORY_CARE_PROVIDER_SITE_OTHER): Payer: PPO | Admitting: Nurse Practitioner

## 2019-04-05 ENCOUNTER — Encounter (INDEPENDENT_AMBULATORY_CARE_PROVIDER_SITE_OTHER): Payer: Self-pay | Admitting: Nurse Practitioner

## 2019-04-05 ENCOUNTER — Other Ambulatory Visit: Payer: Self-pay

## 2019-04-05 VITALS — BP 173/77 | HR 93 | Temp 97.5°F | Ht 66.0 in | Wt 181.8 lb

## 2019-04-05 DIAGNOSIS — K219 Gastro-esophageal reflux disease without esophagitis: Secondary | ICD-10-CM | POA: Diagnosis not present

## 2019-04-05 MED ORDER — PANTOPRAZOLE SODIUM 40 MG PO TBEC: 40.0000 mg | DELAYED_RELEASE_TABLET | Freq: Two times a day (BID) | ORAL | 1 refills | Status: DC
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
## 2019-04-05 NOTE — Patient Instructions (Addendum)
1.  Increase Protonix 40 mg 1 capsule to be taken 30 minutes before breakfast and 1 capsule to be taken 30 minutes before dinner for the next 2 months.  2. Call our office in 4 weeks with an update  3.  Follow-up with Dr. Melony Overly in 3 to 4 months  4.  Avoid acidic products such as tomato juice, orange juice.  Reduce your caffeine and alcohol intake.  No peppermint.   Gastroesophageal Reflux Disease, Adult Gastroesophageal reflux (GER) happens when acid from the stomach flows up into the tube that connects the mouth and the stomach (esophagus). Normally, food travels down the esophagus and stays in the stomach to be digested. With GER, food and stomach acid sometimes move back up into the esophagus. You may have a disease called gastroesophageal reflux disease (GERD) if the reflux:  Happens often.  Causes frequent or very bad symptoms.  Causes problems such as damage to the esophagus. When this happens, the esophagus becomes sore and swollen (inflamed). Over time, GERD can make small holes (ulcers) in the lining of the esophagus. What are the causes? This condition is caused by a problem with the muscle between the esophagus and the stomach. When this muscle is weak or not normal, it does not close properly to keep food and acid from coming back up from the stomach. The muscle can be weak because of:  Tobacco use.  Pregnancy.  Having a certain type of hernia (hiatal hernia).  Alcohol use.  Certain foods and drinks, such as coffee, chocolate, onions, and peppermint. What increases the risk? You are more likely to develop this condition if you:  Are overweight.  Have a disease that affects your connective tissue.  Use NSAID medicines. What are the signs or symptoms? Symptoms of this condition include:  Heartburn.  Difficult or painful swallowing.  The feeling of having a lump in the throat.  A bitter taste in the mouth.  Bad breath.  Having a lot of saliva.  Having an  upset or bloated stomach.  Belching.  Chest pain. Different conditions can cause chest pain. Make sure you see your doctor if you have chest pain.  Shortness of breath or noisy breathing (wheezing).  Ongoing (chronic) cough or a cough at night.  Wearing away of the surface of teeth (tooth enamel).  Weight loss. How is this treated? Treatment will depend on how bad your symptoms are. Your doctor may suggest:  Changes to your diet.  Medicine.  Surgery. Follow these instructions at home: Eating and drinking   Follow a diet as told by your doctor. You may need to avoid foods and drinks such as: ? Coffee and tea (with or without caffeine). ? Drinks that contain alcohol. ? Energy drinks and sports drinks. ? Bubbly (carbonated) drinks or sodas. ? Chocolate and cocoa. ? Peppermint and mint flavorings. ? Garlic and onions. ? Horseradish. ? Spicy and acidic foods. These include peppers, chili powder, curry powder, vinegar, hot sauces, and BBQ sauce. ? Citrus fruit juices and citrus fruits, such as oranges, lemons, and limes. ? Tomato-based foods. These include red sauce, chili, salsa, and pizza with red sauce. ? Fried and fatty foods. These include donuts, french fries, potato chips, and high-fat dressings. ? High-fat meats. These include hot dogs, rib eye steak, sausage, ham, and bacon. ? High-fat dairy items, such as whole milk, butter, and cream cheese.  Eat small meals often. Avoid eating large meals.  Avoid drinking large amounts of liquid with your meals.  Avoid eating meals during the 2-3 hours before bedtime.  Avoid lying down right after you eat.  Do not exercise right after you eat. Lifestyle   Do not use any products that contain nicotine or tobacco. These include cigarettes, e-cigarettes, and chewing tobacco. If you need help quitting, ask your doctor.  Try to lower your stress. If you need help doing this, ask your doctor.  If you are overweight, lose an  amount of weight that is healthy for you. Ask your doctor about a safe weight loss goal. General instructions  Pay attention to any changes in your symptoms.  Take over-the-counter and prescription medicines only as told by your doctor. Do not take aspirin, ibuprofen, or other NSAIDs unless your doctor says it is okay.  Wear loose clothes. Do not wear anything tight around your waist.  Raise (elevate) the head of your bed about 6 inches (15 cm).  Avoid bending over if this makes your symptoms worse.  Keep all follow-up visits as told by your doctor. This is important. Contact a doctor if:  You have new symptoms.  You lose weight and you do not know why.  You have trouble swallowing or it hurts to swallow.  You have wheezing or a cough that keeps happening.  Your symptoms do not get better with treatment.  You have a hoarse voice. Get help right away if:  You have pain in your arms, neck, jaw, teeth, or back.  You feel sweaty, dizzy, or light-headed.  You have chest pain or shortness of breath.  You throw up (vomit) and your throw-up looks like blood or coffee grounds.  You pass out (faint).  Your poop (stool) is bloody or black.  You cannot swallow, drink, or eat. Summary  If a person has gastroesophageal reflux disease (GERD), food and stomach acid move back up into the esophagus and cause symptoms or problems such as damage to the esophagus.  Treatment will depend on how bad your symptoms are.  Follow a diet as told by your doctor.  Take all medicines only as told by your doctor. This information is not intended to replace advice given to you by your health care provider. Make sure you discuss any questions you have with your health care provider. Document Released: 10/21/2007 Document Revised: 11/10/2017 Document Reviewed: 11/10/2017 Elsevier Patient Education  2020 [employer].

## 2019-04-07 IMAGING — MR MR KNEE*R* W/O CM
4 of 7 series · 16 of 40 positions shown · non-contrast
Comparison: None.

CLINICAL DATA: Twisting injury 2 months ago.  Persistent knee pain.

EXAM:
MRI OF THE RIGHT KNEE WITHOUT CONTRAST
TECHNIQUE: Multiplanar, multisequence MR imaging of the knee was performed. No
intravenous contrast was administered.

[Series 3: t2fs axial · axial · 4.0mm · 0.22mm/px · z∈[-33,+61]mm · 3 of 24 slices shown]
[im 5/24]
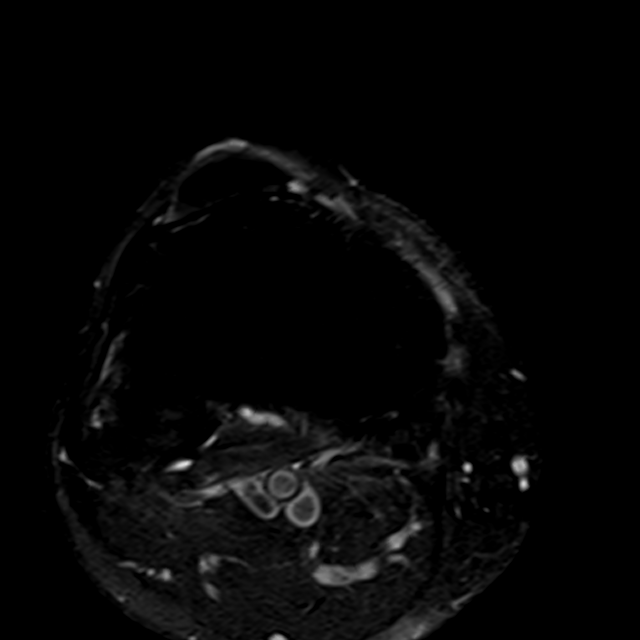
[im 14/24]
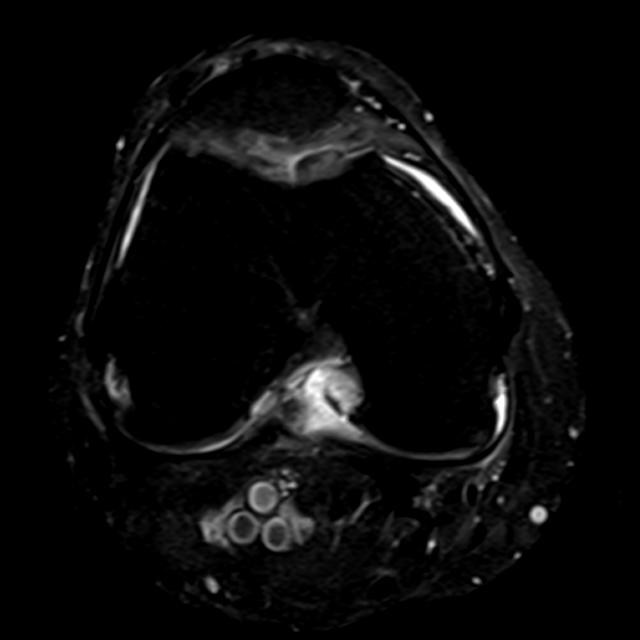
[im 24/24]
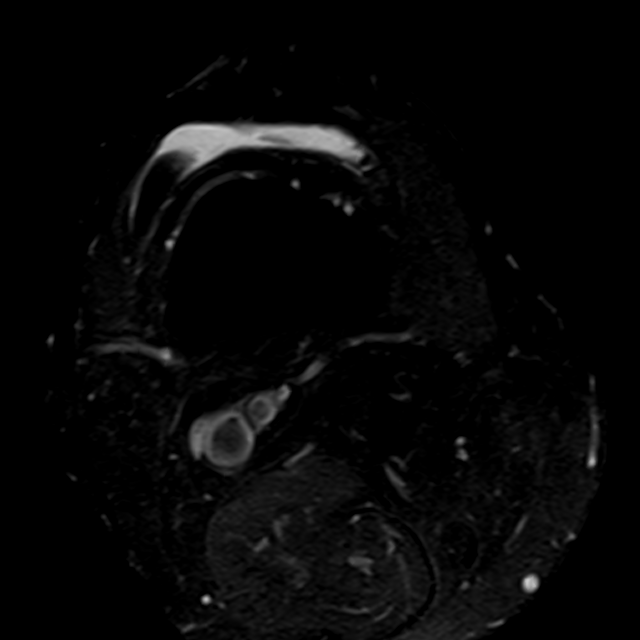

[Series 4: T1 · coronal · 4.0mm · 0.27mm/px · 7 of 28 slices shown]
[im 1/28]
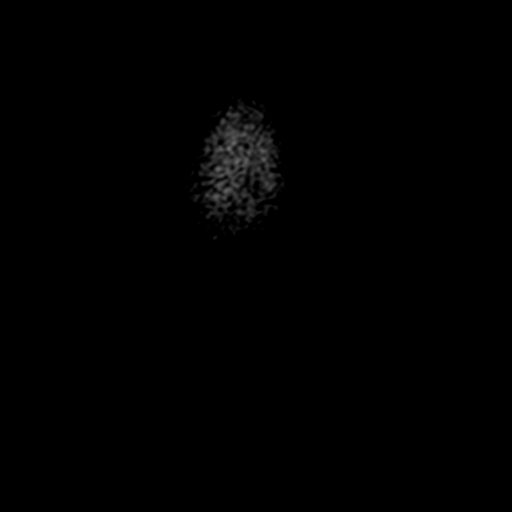
[im 5/28]
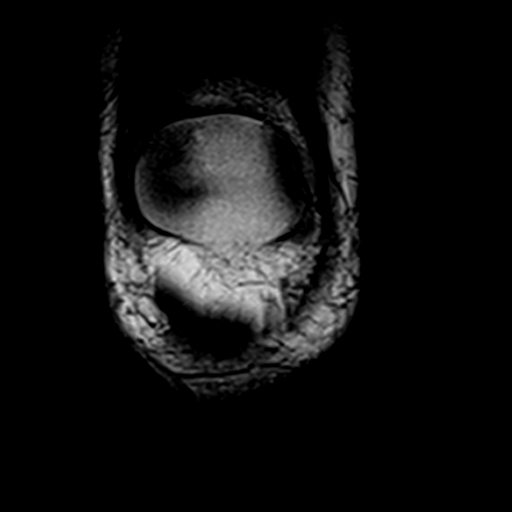
[im 10/28]
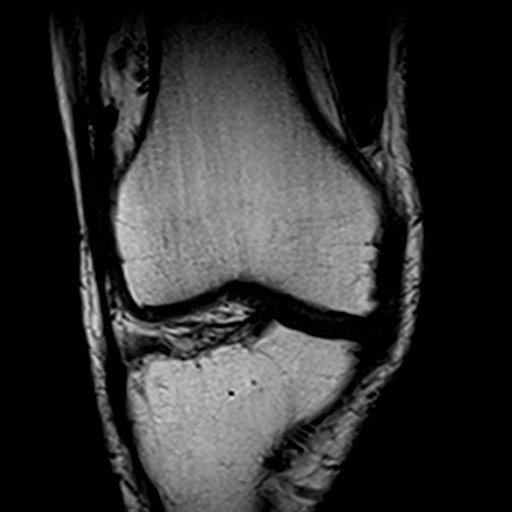
[im 14/28]
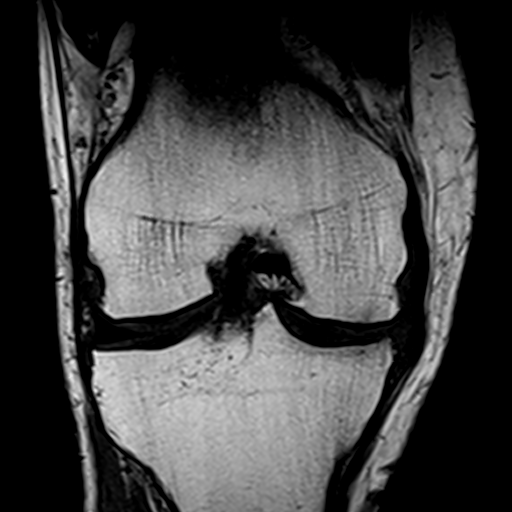
[im 19/28]
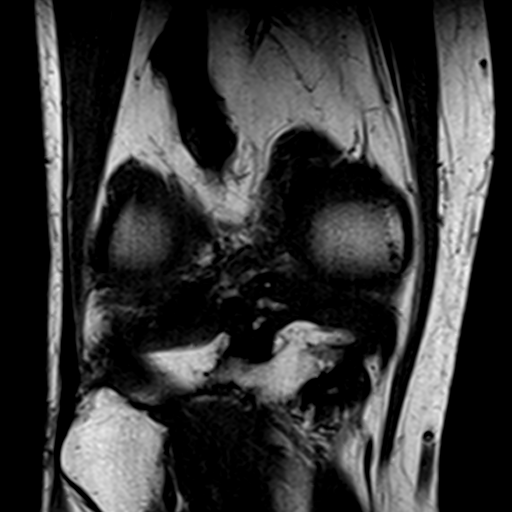
[im 23/28]
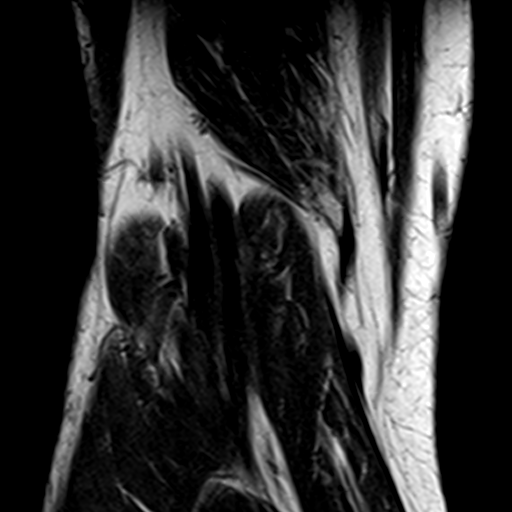
[im 28/28]
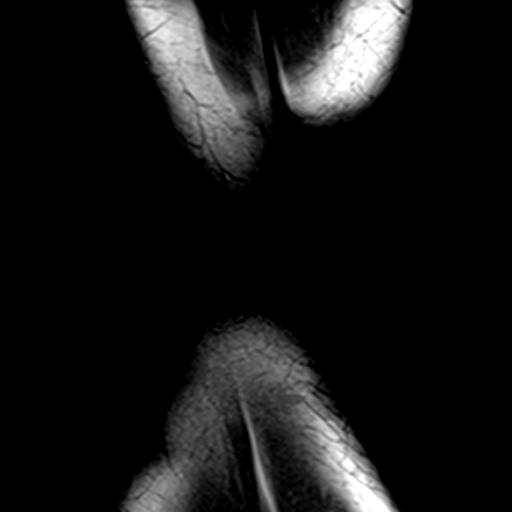

[Series 5: pdfs sag · sagittal · 3.0mm · 0.22mm/px · 3 of 29 slices shown]
[im 6/29]
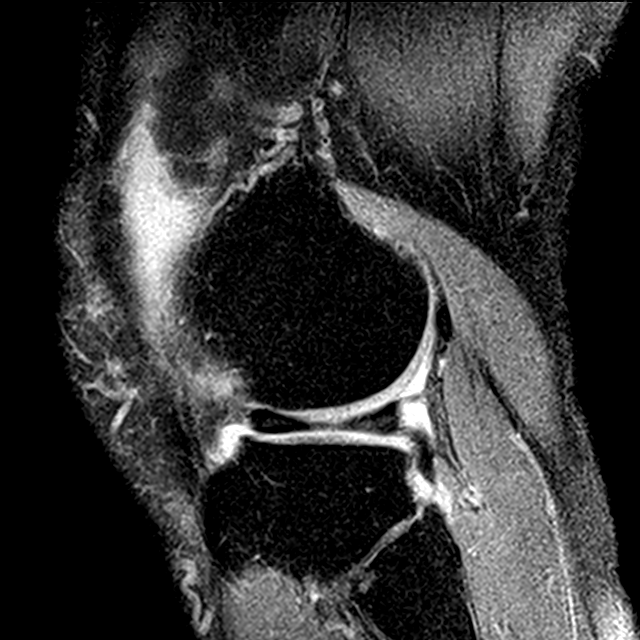
[im 17/29]
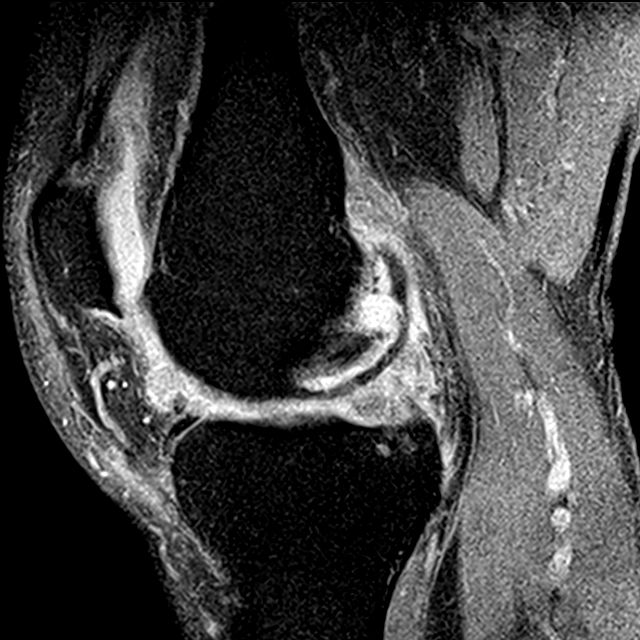
[im 29/29]
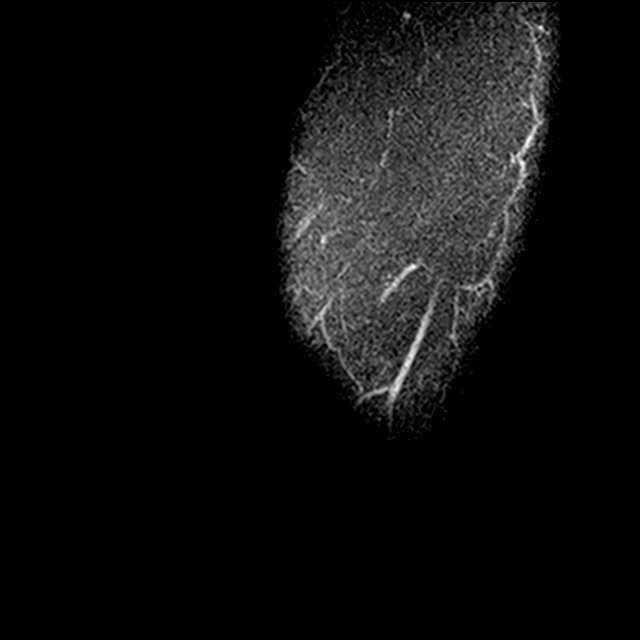

[Series 6: t2fs cor · coronal · 4.0mm · 0.29mm/px · 3 of 28 slices shown]
[im 6/28]
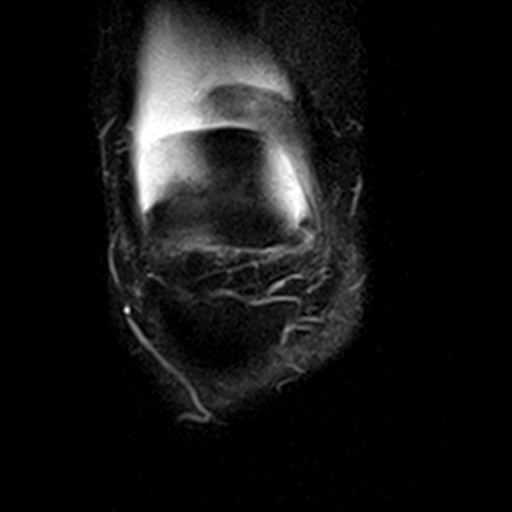
[im 17/28]
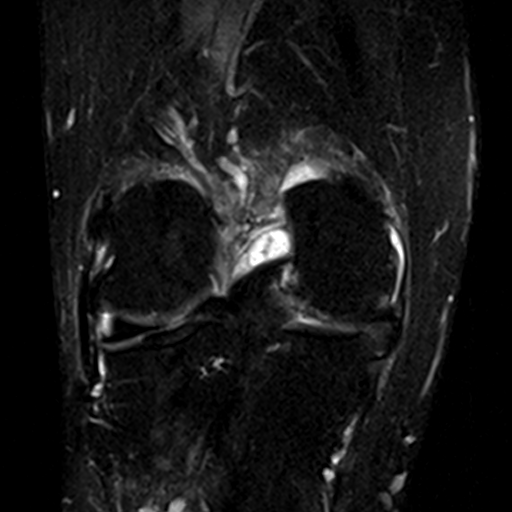
[im 28/28]
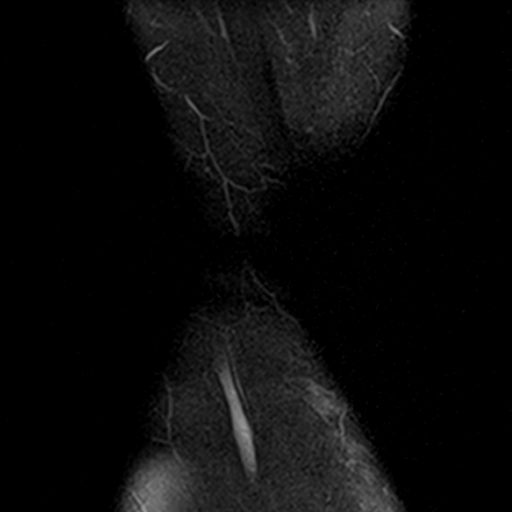

[16 of 40 positions shown; findings below may reference images not displayed]

FINDINGS: MENISCI

Medial meniscus: Severely degenerated and torn. Suspect large radial
tear and detachment from the meniscal root with advanced mucoid
degeneration and extensive maceration involving almost the entire
meniscus.

Lateral meniscus: Intact. Degenerative changes and some free edge
fraying.

LIGAMENTS

Cruciates:  Intact

Collaterals:  Intact

CARTILAGE

Patellofemoral:  Moderate degenerative chondrosis.

Medial: Advanced degenerative chondrosis with areas of
full-thickness cartilage loss, joint space narrowing, spurring and
subchondral cystic change.

Lateral: Moderate degenerative chondrosis with early spurring
changes.

Joint:  Moderate-sized joint effusion.

Popliteal Fossa:  No popliteal mass or Baker's cyst.

Extensor Mechanism: The patella retinacular structures are intact
and the quadriceps and patellar tendons are intact.

Bones:  No acute bony findings.

Other: Intact knee musculature.
IMPRESSION: 1. Severely degenerated and extensively torn medial meniscus.
2. Mild degenerative changes involving the lateral meniscus but no
discrete tear.
3. Intact ligamentous structures and no acute bony findings.
4. Tricompartmental degenerative changes, most significant in the
medial compartment.

## 2019-04-12 ENCOUNTER — Ambulatory Visit (INDEPENDENT_AMBULATORY_CARE_PROVIDER_SITE_OTHER): Payer: PPO | Admitting: Family Medicine

## 2019-04-12 ENCOUNTER — Other Ambulatory Visit: Payer: Self-pay

## 2019-04-12 DIAGNOSIS — M5441 Lumbago with sciatica, right side: Secondary | ICD-10-CM | POA: Diagnosis not present

## 2019-04-12 DIAGNOSIS — G8929 Other chronic pain: Secondary | ICD-10-CM | POA: Diagnosis not present

## 2019-04-12 DIAGNOSIS — M5442 Lumbago with sciatica, left side: Secondary | ICD-10-CM

## 2019-04-12 MED ORDER — OXYCODONE HCL 10 MG PO TABS: ORAL_TABLET | ORAL | 0 refills | Status: DC

## 2019-04-12 NOTE — Progress Notes (Signed)
Subjective:    Patient ID: [name], male    DOB: [birth year], 78 y.o.   MRN: [id number]  HPI This patient was seen today for chronic pain. Takes for hip and knee pain a month this is Dr. Nicki Reaper itself I am after 11 I will try calling back in a few minutes he find catching in feel free to give me a call back as well but off nonetheless I will call you probably in about 5 to 10 minutes see how things go thanks  The medication list was reviewed and updated.   -Compliance with medication: takes 2-3  - Number patient states they take daily: 2 -3 a day -when was the last dose patient took? today  The patient was advised the importance of maintaining medication and not using illegal substances with these.  Here for refills and follow up  The patient was educated that we can provide 3 monthly scripts for their medication, it is their responsibility to follow the instructions.  Side effects or complications from medications: none  Patient is aware that pain medications are meant to minimize the severity of the pain to allow their pain levels to improve to allow for better function. They are aware of that pain medications cannot totally remove their pain.  Due for UDT ( at least once per year) : 08/13/17  Virtual Visit via Telephone Note  I connected with [name] on 04/12/19 at 10:30 AM EST by telephone and verified that I am speaking with the correct person using two identifiers.  Location: Patient: home Provider: office   I discussed the limitations, risks, security and privacy concerns of performing an evaluation and management service by telephone and the availability of in person appointments. I also discussed with the patient that there may be a patient responsible charge related to this service. The patient expressed understanding and agreed to proceed.   History of Present Illness:    Observations/Objective:   Assessment and Plan:   Follow Up Instructions:   I discussed the assessment and treatment plan with the patient. The patient was provided an opportunity to ask questions and all were answered. The patient agreed with the plan and demonstrated an understanding of the instructions.   The patient was advised to call back or seek an in-person evaluation if the symptoms worsen or if the condition fails to improve as anticipated.  I provided 17 minutes of non-face-to-face time during this encounter.       Fall Risk  03/15/2019 03/07/2019 05/25/2018 06/07/2015 02/19/2015  Falls in the past year? 0 0 1 No No  Number falls in past yr: - - 1 - -  Injury with Fall? - - 1 - -  Follow up Falls evaluation completed Falls evaluation completed Falls evaluation completed - -    Review of Systems     Objective:   Physical Exam Today's visit was via telephone Physical exam was not possible for this visit        Assessment & Plan:  The patient was seen in followup for chronic pain. A review over at their current pain status was discussed. Drug registry was checked. Prescriptions were given. Discussion was held regarding the importance of compliance with medication as well as pain medication contract.  Time for questions regarding pain management plan occurred. Importance of regular followup visits was discussed. Patient was informed that medication may cause drowsiness and should not be combined  with other medications/alcohol or street drugs. Patient was  cautioned that medication could cause drowsiness. If the patient feels medication is causing altered alertness then do not drive or operate dangerous equipment.  3 scripts were sent and patient to do follow-up if any ongoing troubles otherwise follow-up 3 months  Patient states his reflux is doing better on the medication He relates his hoarseness has resolved

## 2019-04-29 ENCOUNTER — Other Ambulatory Visit (INDEPENDENT_AMBULATORY_CARE_PROVIDER_SITE_OTHER): Payer: Self-pay | Admitting: Nurse Practitioner

## 2019-04-29 DIAGNOSIS — K219 Gastro-esophageal reflux disease without esophagitis: Secondary | ICD-10-CM

## 2019-05-09 ENCOUNTER — Other Ambulatory Visit: Payer: Self-pay | Admitting: Family Medicine

## 2019-05-09 NOTE — Telephone Encounter (Signed)
Last seen for med check 04/12/19

## 2019-06-02 ENCOUNTER — Other Ambulatory Visit (INDEPENDENT_AMBULATORY_CARE_PROVIDER_SITE_OTHER): Payer: Self-pay | Admitting: Gastroenterology

## 2019-06-02 DIAGNOSIS — K219 Gastro-esophageal reflux disease without esophagitis: Secondary | ICD-10-CM

## 2019-06-02 MED ORDER — PANTOPRAZOLE SODIUM 40 MG PO TBEC: 40.0000 mg | DELAYED_RELEASE_TABLET | Freq: Two times a day (BID) | ORAL | 6 refills | Status: DC

## 2019-06-02 NOTE — Progress Notes (Signed)
Refill of protonix sent per request

## 2019-06-05 ENCOUNTER — Ambulatory Visit (INDEPENDENT_AMBULATORY_CARE_PROVIDER_SITE_OTHER): Payer: PPO | Admitting: Family Medicine

## 2019-06-05 ENCOUNTER — Other Ambulatory Visit: Payer: Self-pay

## 2019-06-05 DIAGNOSIS — E7849 Other hyperlipidemia: Secondary | ICD-10-CM

## 2019-06-05 DIAGNOSIS — I700 Atherosclerosis of aorta: Secondary | ICD-10-CM

## 2019-06-05 DIAGNOSIS — R7303 Prediabetes: Secondary | ICD-10-CM | POA: Diagnosis not present

## 2019-06-05 DIAGNOSIS — I10 Essential (primary) hypertension: Secondary | ICD-10-CM

## 2019-06-05 DIAGNOSIS — G8929 Other chronic pain: Secondary | ICD-10-CM | POA: Diagnosis not present

## 2019-06-05 DIAGNOSIS — M549 Dorsalgia, unspecified: Secondary | ICD-10-CM | POA: Diagnosis not present

## 2019-06-05 MED ORDER — OXYCODONE HCL 10 MG PO TABS: ORAL_TABLET | ORAL | 0 refills | Status: DC

## 2019-06-05 MED ORDER — AMLODIPINE BESYLATE 5 MG PO TABS: 5.0000 mg | ORAL_TABLET | Freq: Every day | ORAL | 1 refills | Status: DC

## 2019-06-05 MED ORDER — INDAPAMIDE 2.5 MG PO TABS: 2.5000 mg | ORAL_TABLET | Freq: Every day | ORAL | 1 refills | Status: DC

## 2019-06-05 MED ORDER — RAMIPRIL 10 MG PO CAPS: 10.0000 mg | ORAL_CAPSULE | Freq: Two times a day (BID) | ORAL | 1 refills | Status: DC

## 2019-06-05 MED ORDER — PRAVASTATIN SODIUM 80 MG PO TABS: 80.0000 mg | ORAL_TABLET | Freq: Every day | ORAL | 1 refills | Status: DC

## 2019-06-05 MED ORDER — APIXABAN 5 MG PO TABS: 5.0000 mg | ORAL_TABLET | Freq: Two times a day (BID) | ORAL | 5 refills | Status: DC

## 2019-06-05 NOTE — Progress Notes (Signed)
Subjective:    Patient ID: [name], male    DOB: 09-28-1940, 79 y.o.   MRN: [id number]  HPI This patient was seen today for chronic pain He states pain medicine does allow him to function better He is having progressive orthopedic problems has chronic back disease that unfortunately further surgery unlikely to help Also has severe osteoarthritis of the knee and the hip more than likely will be facing surgery later this year Covid protection was discussed Covid vaccine discussed as well  The medication list was reviewed and updated.   -Compliance with medication: yes  - Number patient states they take daily: 4  -when was the last dose patient took? today  The patient was advised the importance of maintaining medication and not using illegal substances with these.  Here for refills and follow up  The patient was educated that we can provide 3 monthly scripts for their medication, it is their responsibility to follow the instructions.  Side effects or complications from medications: none  Patient is aware that pain medications are meant to minimize the severity of the pain to allow their pain levels to improve to allow for better function. They are aware of that pain medications cannot totally remove their pain.  Due for UDT ( at least once per year) : last one 08/13/17. Doing virtual visit today so did not get to collect sample.   Virtual Visit via Telephone Note  I connected with [name] on 06/05/19 at 10:00 AM EST by telephone and verified that I am speaking with the correct person using two identifiers.  Location: Patient: home Provider: office   I discussed the limitations, risks, security and privacy concerns of performing an evaluation and management service by telephone and the availability of in person appointments. I also discussed with the patient that there may be a patient responsible charge related to this service. The patient expressed understanding  and agreed to proceed.   History of Present Illness:    Observations/Objective:   Assessment and Plan:   Follow Up Instructions:    I discussed the assessment and treatment plan with the patient. The patient was provided an opportunity to ask questions and all were answered. The patient agreed with the plan and demonstrated an understanding of the instructions.   The patient was advised to call back or seek an in-person evaluation if the symptoms worsen or if the condition fails to improve as anticipated.  I provided 30 minutes of non-face-to-face time during this encounter.     Review of Systems  Constitutional: Negative for activity change.  HENT: Negative for congestion and rhinorrhea.   Respiratory: Negative for cough and shortness of breath.   Cardiovascular: Negative for chest pain.  Gastrointestinal: Negative for abdominal pain, diarrhea, nausea and vomiting.  Genitourinary: Negative for dysuria and hematuria.  Musculoskeletal: Positive for back pain. Negative for arthralgias.  Neurological: Negative for weakness and headaches.  Psychiatric/Behavioral: Negative for behavioral problems and confusion.   Severe hip back and shoulder pain    Objective:   Physical Exam  Today's visit was via telephone Physical exam was not possible for this visit  Fall Risk  03/15/2019 03/07/2019 05/25/2018 06/07/2015 02/19/2015  Falls in the past year? 0 0 1 No No  Number falls in past yr: - - 1 - -  Injury with Fall? - - 1 - -  Follow up Falls evaluation completed Falls evaluation completed Falls evaluation completed - -  Assessment & Plan:  1. HYPERTENSION, BENIGN Blood pressure under good control taking his medicine on a regular basis watches salt in diet difficult for him to do much in the way of aerobic activity  2. Aortic atherosclerosis (HCC) Atherosclerosis keeps it under control with his cholesterol medicine takes his medicine regular basis does not need lab work  currently his previous labs reviewed new labs will be ordered for later this year  3. Chronic back pain greater than 3 months duration He has chronic pain pain medicine does allow him to function better he denies abusing the medicine.  Takes 3 or 4/day drug registry was checked The patient was seen in followup for chronic pain. A review over at their current pain status was discussed. Drug registry was checked. Prescriptions were given. Discussion was held regarding the importance of compliance with medication as well as pain medication contract.  Time for questions regarding pain management plan occurred. Importance of regular followup visits was discussed. Patient was informed that medication may cause drowsiness and should not be combined  with other medications/alcohol or street drugs. Patient was cautioned that medication could cause drowsiness. If the patient feels medication is causing altered alertness then do not drive or operate dangerous equipment.    4. Other hyperlipidemia Lipidemia previous labs reviewed new labs not necessary currently continue current medications.  5. Prediabetes Avoiding excessive starches in the diet stay physically active. Patient also on chronic anticoagulants denies any bleeding issues.  Very important for him to stay on this to prevent stroke with his A. fib  Discussion held regarding Covid vaccine very important for the patient to get this avenues on how to get hooked up for a vaccine will be sent to his daughter

## 2019-07-18 ENCOUNTER — Telehealth: Payer: Self-pay | Admitting: Family Medicine

## 2019-07-18 DIAGNOSIS — R319 Hematuria, unspecified: Secondary | ICD-10-CM

## 2019-07-18 NOTE — Telephone Encounter (Signed)
Pt is needing a referral to Dr. Jeffie Pollock urology

## 2019-07-18 NOTE — Telephone Encounter (Signed)
Please go ahead with referral to Dr. Christia Reading will need to ask the patient for the reason why

## 2019-07-18 NOTE — Telephone Encounter (Signed)
Patient has seen Dr Jeffie Pollock in the past for hematuria and needs a follow up but needs a referral. Referral ordered in [place].

## 2019-07-19 ENCOUNTER — Encounter: Payer: Self-pay | Admitting: Family Medicine

## 2019-07-20 ENCOUNTER — Encounter: Payer: Self-pay | Admitting: Family Medicine

## 2019-08-14 ENCOUNTER — Other Ambulatory Visit: Payer: Self-pay

## 2019-08-14 ENCOUNTER — Ambulatory Visit (INDEPENDENT_AMBULATORY_CARE_PROVIDER_SITE_OTHER): Payer: PPO | Admitting: Internal Medicine

## 2019-08-14 ENCOUNTER — Encounter (INDEPENDENT_AMBULATORY_CARE_PROVIDER_SITE_OTHER): Payer: Self-pay | Admitting: Internal Medicine

## 2019-08-14 VITALS — BP 163/89 | HR 72 | Temp 97.5°F | Ht 66.0 in | Wt 185.4 lb

## 2019-08-14 DIAGNOSIS — K219 Gastro-esophageal reflux disease without esophagitis: Secondary | ICD-10-CM

## 2019-08-14 DIAGNOSIS — Z86010 Personal history of colonic polyps: Secondary | ICD-10-CM

## 2019-08-14 MED ORDER — PANTOPRAZOLE SODIUM 40 MG PO TBEC: 40.0000 mg | DELAYED_RELEASE_TABLET | Freq: Every day | ORAL | 3 refills | Status: DC

## 2019-08-14 NOTE — Patient Instructions (Signed)
Notify if he swallowing difficulty or if pantoprazole stops working.

## 2019-08-14 NOTE — Progress Notes (Signed)
Presenting complaint;  Follow-up for chronic GERD.  Diabetes and subjective:  Patient is 79 year old patient-year-old is history of GERD and colonic adenomas visit for symptoms.  Patient was last seen in November 2020 and treated with twice daily pantoprazole.  He also has a history of colonic adenomas. Last EGD was in November 2019.  Esophageal appearance suggested eosinophilic esophagitis but biopsy was negative.  He also had gastric fundic type polyps. He also has history of colonic adenomas.  Last colonoscopy was in March 2018. He states he is doing well from GI standpoint.  He rarely experiences heartburn asthma medicine takes this medication.  He denies dysphagia nausea vomiting sore throat or chronic cough.  He states once he got to come off PPI and had terrible time of heartburn and regurgitation. His bowels move daily.  He denies melena or rectal bleeding. He remains with chronic back pain as well as shoulder pain.  He states he is unable to sleep at nights.  On most days he takes 1 or 2 doses of oxycodone.  Current Medications: Outpatient Encounter Medications as of 08/14/2019  Medication Sig  . amLODipine (NORVASC) 5 MG tablet Take 1 tablet (5 mg total) by mouth daily.  Marland Kitchen apixaban (ELIQUIS) 5 MG TABS tablet Take 1 tablet (5 mg total) by mouth 2 (two) times daily.  Marland Kitchen diltiazem (CARDIZEM) 30 MG tablet Take 1/2 tablet every 4 hours AS NEEDED for afib  . diphenhydrAMINE-APAP, sleep, (TYLENOL PM EXTRA STRENGTH PO) Take 1 tablet by mouth at bedtime as needed (sleep).   . indapamide (LOZOL) 2.5 MG tablet Take 1 tablet (2.5 mg total) by mouth daily.  Marland Kitchen latanoprost (XALATAN) 0.005 % ophthalmic solution Place 1 drop into both eyes at bedtime.  . Oxycodone HCl 10 MG TABS One 4 times a day prn pain  . pantoprazole (PROTONIX) 40 MG tablet Take 1 tablet (40 mg total) by mouth 2 (two) times daily.  . pravastatin (PRAVACHOL) 80 MG tablet Take 1 tablet (80 mg total) by mouth daily.  . ramipril  (ALTACE) 10 MG capsule Take 1 capsule (10 mg total) by mouth 2 (two) times daily.  . Oxycodone HCl 10 MG TABS One 4 times a day prn pain  . Oxycodone HCl 10 MG TABS One 4 times a day prn pain  . sucralfate (CARAFATE) 1 g tablet TAKE ONE TABLET MIXED IN WATER 3 TIMES DAILY (Patient not taking: Reported on 08/14/2019)   No facility-administered encounter medications on file as of 08/14/2019.     Objective: Blood pressure (!) 163/89, pulse 72, temperature (!) 97.5 F (36.4 C), temperature source Temporal, height 5\' 6"  (1.676 m), weight 185 lb 6.4 oz (84.1 kg). Patient is alert and in no acute distress. He is wearing facial mask. Conjunctiva is pink. Sclera is nonicteric Oropharyngeal mucosa is normal. No neck masses or thyromegaly noted. Cardiac exam with regular rhythm normal S1 and S2. No murmur or gallop noted. Lungs are clear to auscultation. Abdomen is symmetrical soft and nontender with no organomegaly or masses No LE edema or clubbing noted.  Assessment:  #1.  Chronic GERD.  He appears to be doing well with antireflux measures and single dose PPI.  Would not recommend changing therapy at this time.  #2.  History of colonic adenomas.  Last colonoscopy was 3 years ago.  Next colonoscopy would be in 2 years.  Plan:  Continue antireflux measures and pantoprazole with current dose. New prescription for pantoprazole 40 mg by mouth 30 minutes before breakfast daily 90  doses with 3 refills sent to patient's pharmacy. Patient advised to call office if pantoprazole stops working or if you have swallowing difficulty. Office visit in 1 year.

## 2019-08-29 ENCOUNTER — Other Ambulatory Visit: Payer: Self-pay

## 2019-08-29 ENCOUNTER — Ambulatory Visit (INDEPENDENT_AMBULATORY_CARE_PROVIDER_SITE_OTHER): Payer: PPO | Admitting: Family Medicine

## 2019-08-29 VITALS — BP 148/82 | Temp 97.8°F | Wt 189.8 lb

## 2019-08-29 DIAGNOSIS — I10 Essential (primary) hypertension: Secondary | ICD-10-CM | POA: Diagnosis not present

## 2019-08-29 DIAGNOSIS — G8929 Other chronic pain: Secondary | ICD-10-CM

## 2019-08-29 DIAGNOSIS — M75112 Incomplete rotator cuff tear or rupture of left shoulder, not specified as traumatic: Secondary | ICD-10-CM

## 2019-08-29 DIAGNOSIS — R200 Anesthesia of skin: Secondary | ICD-10-CM

## 2019-08-29 DIAGNOSIS — M25552 Pain in left hip: Secondary | ICD-10-CM | POA: Diagnosis not present

## 2019-08-29 DIAGNOSIS — E7849 Other hyperlipidemia: Secondary | ICD-10-CM

## 2019-08-29 DIAGNOSIS — M549 Dorsalgia, unspecified: Secondary | ICD-10-CM

## 2019-08-29 DIAGNOSIS — R202 Paresthesia of skin: Secondary | ICD-10-CM | POA: Diagnosis not present

## 2019-08-29 MED ORDER — OXYCODONE HCL 10 MG PO TABS: ORAL_TABLET | ORAL | 0 refills | Status: DC

## 2019-08-29 NOTE — Progress Notes (Signed)
Subjective:    Patient ID: [name], male    DOB: 1940-12-16, 79 y.o.   MRN: [id number]  HPI Patient comes in today for a medication check. Patient overall having a rough He has left shoulder pain discomfort with limited range of motion Also torn biceps tendon bilateral giving him more pain on the left side Also relates numbness and tingling into index finger and thumb on the left side he also relates left hip pain discomfort hurts with standing up moving walking going up steps He also relates difficult time moving about because of his arthritis Blood pressure has been elevated recently.  Patient states he would like to discuss some pain in both arms.   Fall Risk  08/29/2019 03/15/2019 03/07/2019 05/25/2018 06/07/2015  Falls in the past year? 0 0 0 1 No  Number falls in past yr: - - - 1 -  Injury with Fall? - - - 1 -  Follow up - Falls evaluation completed Falls evaluation completed Falls evaluation completed -    Review of Systems  Constitutional: Negative for activity change.  HENT: Negative for congestion and rhinorrhea.   Respiratory: Negative for cough and shortness of breath.   Cardiovascular: Negative for chest pain.  Gastrointestinal: Negative for abdominal pain, diarrhea, nausea and vomiting.  Genitourinary: Negative for dysuria and hematuria.  Musculoskeletal: Positive for arthralgias and back pain.  Neurological: Negative for weakness and headaches.  Psychiatric/Behavioral: Negative for behavioral problems and confusion.       Objective:   Physical Exam Vitals reviewed.  Cardiovascular:     Rate and Rhythm: Normal rate and regular rhythm.     Heart sounds: Normal heart sounds. No murmur.  Pulmonary:     Effort: Pulmonary effort is normal.     Breath sounds: Normal breath sounds.  Lymphadenopathy:     Cervical: No cervical adenopathy.  Neurological:     Mental Status: He is alert.  Psychiatric:        Behavior: Behavior normal.   Limited range of  motion of both shoulders worse on the left side than the right side Bilateral bicep tendon tear noted bilateral Subjective numbness tingling into the index finger and thumb Patient walks with a very guarded gait because of his orthopedic problems        Assessment & Plan:  1. Other hyperlipidemia Cholesterol decent control no need to check lab work currently continue current medication lab work in the summer  2. HYPERTENSION, BENIGN Blood pressure elevated bump up dose of amlodipine new dose to 10 mg daily follow-up patient again in 4 weeks to recheck   3. Incomplete rotator cuff tear or rupture of left shoulder, not specified as traumatic Referral to orthopedic surgery for evaluation possible physical therapy injections may need surgery but patient reluctant - Ambulatory referral to Orthopedic Surgery  4. Numbness and tingling in left hand Patient will need nerve conduction studies of his hand as well as possibility of an EMG  5. Left hip pain Left hip pain he would do some gentle range of motion exercises if ongoing troubles see orthopedics   - Ambulatory referral to Orthopedic Surgery  6. Chronic back pain greater than 3 months duration The patient was seen in followup for chronic pain. A review over at their current pain status was discussed. Drug registry was checked. Prescriptions were given.  Regular follow-up recommended. Discussion was held regarding the importance of compliance with medication as well as pain medication contract.  Patient was informed that  medication may cause drowsiness and should not be combined  with other medications/alcohol or street drugs. If the patient feels medication is causing altered alertness then do not drive or operate dangerous equipment.  Drug registry checked he states that the medication does allow him to function better denies abusing it 3 prescription sent in  30 minutes spent with patient evaluating multiple problems guiding him  on proper therapy and documenting

## 2019-09-04 ENCOUNTER — Encounter: Payer: Self-pay | Admitting: Family Medicine

## 2019-09-08 ENCOUNTER — Ambulatory Visit: Payer: Self-pay

## 2019-09-08 ENCOUNTER — Ambulatory Visit: Payer: PPO | Admitting: Orthopaedic Surgery

## 2019-09-08 ENCOUNTER — Other Ambulatory Visit: Payer: Self-pay

## 2019-09-08 ENCOUNTER — Encounter: Payer: Self-pay | Admitting: Orthopaedic Surgery

## 2019-09-08 DIAGNOSIS — M25512 Pain in left shoulder: Secondary | ICD-10-CM

## 2019-09-08 DIAGNOSIS — M25552 Pain in left hip: Secondary | ICD-10-CM | POA: Diagnosis not present

## 2019-09-08 DIAGNOSIS — M25551 Pain in right hip: Secondary | ICD-10-CM

## 2019-09-08 DIAGNOSIS — G8929 Other chronic pain: Secondary | ICD-10-CM | POA: Diagnosis not present

## 2019-09-08 NOTE — Progress Notes (Signed)
Subjective: Patient is here for ultrasound-guided intra-articular left glenohumeral injection.  He has glenohumeral arthritis with limited range of motion, chronically torn long head biceps tendon.  Objective: Decreased abduction and behind the back reach.  Popeye deformity.  Procedure: Ultrasound-guided left glenohumeral injection: After sterile prep with Betadine, injected 8 cc 1% lidocaine without epinephrine and 40 mg methylprednisolone using a 22-gauge spinal needle, passing the needle from posterior approach into the glenohumeral joint.  Injectate was seen filling the joint capsule.  Excellent immediate relief.  Could contemplate dextrose prolotherapy or PRP in the future if he fails to get long-term relief.

## 2019-09-08 NOTE — Progress Notes (Signed)
Office Visit Note   Patient: [name]           Date of Birth: 09/16/1940           MRN: [id number] Visit Date: 09/08/2019              Requested by: Kathyrn Drown, MD [place],  [place] [id number] PCP: Kathyrn Drown, MD   Assessment & Plan: Visit Diagnoses:  1. Pain in left hip   2. Chronic left shoulder pain     Plan: Impression is left shoulder pain glenohumeral osteoarthritis with temporary relief from previous subacromial injections.  For the hip he would like to just start with some home exercises and stretches and resistance bands.  He will hold off on injection for now.  He will follow up with [place] as needed.  Follow-Up Instructions: Return if symptoms worsen or fail to improve.   Orders:  Orders Placed This Encounter  Procedures  . XR HIP UNILAT W OR W/O PELVIS 2-3 VIEWS LEFT  . XR Shoulder Left   No orders of the defined types were placed in this encounter.     Procedures: No procedures performed   Clinical Data: No additional findings.   Subjective: Chief Complaint  Patient presents with  . Left Hip - Pain  . Left Shoulder - Pain    [name] is a 79 year old gentleman who comes in for evaluation of chronic left shoulder pain and left hip pain.  For the left shoulder I have seen him previously for this and we have done 2 subacromial injections in the past.  The pain relief has been temporary.  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  He has some tingling and numbness in both arms.  He is status post neck surgery with hardware and fusion.  He is also had left lateral hip pain for about a year.  Denies any groin pain.  No radiation of pain or numbness and tingling.   Review of Systems  Constitutional: Negative.   All other systems reviewed and are negative.    Objective: Vital Signs: There were no vitals taken for this visit.  Physical Exam Vitals and nursing note reviewed.  Constitutional:      Appearance: He is  well-developed.  Pulmonary:     Effort: Pulmonary effort is normal.  Abdominal:     Palpations: Abdomen is soft.  Skin:    General: Skin is warm.  Neurological:     Mental Status: He is alert and oriented to person, place, and time.  Psychiatric:        Behavior: Behavior normal.        Thought Content: Thought content normal.        Judgment: Judgment normal.     Ortho Exam Left shoulder shows moderate limitation range of motion.  Manual muscle testing is grossly normal. Left hip shows tenderness along the trochanteric bursa and lateral hip.  No groin pain.  Decent range of motion. Specialty Comments:  No specialty comments available.  Imaging: XR HIP UNILAT W OR W/O PELVIS 2-3 VIEWS LEFT  Result Date: 09/08/2019 Mild hip osteoarthritis  XR Shoulder Left  Result Date: 09/08/2019 Mild to moderate glenohumeral osteoarthritis.    PMFS History: Patient Active Problem List   Diagnosis Date Noted  . S/P right knee arthroscopy 08/10/2018  . Acute tear lateral meniscus, right, initial encounter 08/03/2018  . Acute medial meniscus tear, right, subsequent encounter 07/13/2018  .  Melena 03/28/2018  . Closed fracture of lateral portion of left tibial plateau 03/09/2018  . Aortic atherosclerosis ([place]) 11/05/2016  . History of colonic polyps 06/16/2016  . Pseudoarthrosis of lumbar spine 04/24/2014  . Hx of adenomatous colonic polyps 11/22/2013  . Prediabetes 10/12/2013  . Fatigue 12/21/2012  . Snoring 12/21/2012  . Chronic back pain greater than 3 months duration 10/31/2012  . Sinusitis 02/22/2012  . GERD (gastroesophageal reflux disease) 02/22/2012  . Hyperlipidemia 01/21/2012  . HYPERTENSION, BENIGN 12/06/2009  . ATRIAL FIBRILLATION, PAROXYSMAL 12/06/2009   Past Medical History:  Diagnosis Date  . Arthritis   . Cancer ([place])    skin  . Carpal tunnel syndrome   . Cataract    left eye  . Chronic back pain    multiple back surgeries  . Complication of anesthesia     pt states woke up during hemorroid surgery  . Constipation    Metamucil every mornind  . DVT (deep venous thrombosis) ([place]) 03/2007   left leg   . GERD (gastroesophageal reflux disease)    takes Protonix daily  . Glaucoma   . History of colon polyps   . History of hyperglycemia   . History of MRSA infection 1992  . HTN (hypertension)    takes  Ramipril daily  . Hyperlipidemia    takes Pravastatin 3 times a week  . Joint pain   . Joint swelling   . Nocturia   . Paroxysmal atrial fibrillation (HCC)    takes Pradaxa daily, Chads2vasc score 2(07/11/14)  . Pneumonia    many yrs ago  . Pulmonary embolism ([place]) 2008  . Urinary frequency   . Urinary urgency     Family History  Problem Relation Age of Onset  . Hypertension Mother   . Transient ischemic attack Mother   . Heart disease Father   . Diabetes Brother   . Healthy Daughter   . Colon cancer Sister   . Hypertension Other   . Heart disease Other     Past Surgical History:  Procedure Laterality Date  . BIOPSY  04/01/2018   Procedure: BIOPSY;  Surgeon: Rogene Houston, MD;  [facility];  Service: Endoscopy;;  gastric  . CARDIAC CATHETERIZATION  2008  . CARPAL TUNNEL RELEASE Right   . cataract surgery Right   . cervical and lumbar surgery    . COLONOSCOPY  04/24/2011   Procedure: COLONOSCOPY;  Surgeon: Rogene Houston, MD;  [facility];  Service: Endoscopy;  Laterality: N/A;  1200  . COLONOSCOPY N/A 08/13/2016   Procedure: COLONOSCOPY;  Surgeon: Rogene Houston, MD;  [facility];  Service: Endoscopy;  Laterality: N/A;  1200  . ESOPHAGOGASTRODUODENOSCOPY  03/10/2012   Procedure: ESOPHAGOGASTRODUODENOSCOPY (EGD);  Surgeon: Rogene Houston, MD;  [facility];  Service: Endoscopy;  Laterality: N/A;  325  . ESOPHAGOGASTRODUODENOSCOPY (EGD) WITH PROPOFOL N/A 04/01/2018   Procedure: ESOPHAGOGASTRODUODENOSCOPY (EGD) WITH PROPOFOL;  Surgeon: Rogene Houston, MD;  [facility];  Service: Endoscopy;  Laterality: N/A;  . HARDWARE REMOVAL N/A 04/24/2014   Procedure: REMOVAL OF SACRAL INSTRUMENTATION WITH [place];  Surgeon: Kristeen Miss, MD;  Location: [facility];  Service: Neurosurgery;  Laterality: N/A;  . HEMORRHOIDECTOMY WITH HEMORRHOID BANDING    . KNEE ARTHROSCOPY Right   . KNEE ARTHROSCOPY WITH MEDIAL MENISECTOMY Right 08/03/2018   Procedure: RIGHT KNEE ARTHROSCOPY WITH PARTIAL MEDIAL MENISCECTOMY;  Surgeon: Leandrew Koyanagi, MD;  Location: [facility];  Service: Orthopedics;  Laterality: Right;  . KNEE SURGERY     left  . LASIK    . POLYPECTOMY  08/13/2016   Procedure: POLYPECTOMY;  Surgeon: Rogene Houston, MD;  [facility];  Service: Endoscopy;;  colon  . PVI  01/24/10   afib ablation   Social History   Occupational History  . Occupation: retired  Tobacco Use  . Smoking status: Former Smoker    Packs/day: 0.25    Years: 3.00    Pack years: 0.75    Types: Cigarettes    Quit date: 03/30/1969    Years since quitting: 50.4  . Smokeless tobacco: Never Used  Substance and Sexual Activity  . Alcohol use: Yes    Alcohol/week: 7.0 standard drinks    Types: 7 Glasses of wine per week    Comment: wine nightly  . Drug use: No  . Sexual activity: Yes

## 2019-09-12 ENCOUNTER — Encounter (HOSPITAL_COMMUNITY): Payer: Self-pay | Admitting: Nurse Practitioner

## 2019-09-12 ENCOUNTER — Ambulatory Visit (HOSPITAL_COMMUNITY)
Admission: RE | Admit: 2019-09-12 | Discharge: 2019-09-12 | Disposition: A | Discharge status: Home / Self Care | Payer: PPO | Source: Ambulatory Visit | Attending: Nurse Practitioner | Admitting: Nurse Practitioner

## 2019-09-12 ENCOUNTER — Other Ambulatory Visit: Payer: Self-pay

## 2019-09-12 VITALS — BP 146/90 | HR 69 | Ht 66.0 in | Wt 187.0 lb

## 2019-09-12 DIAGNOSIS — Z800 Family history of malignant neoplasm of digestive organs: Secondary | ICD-10-CM | POA: Insufficient documentation

## 2019-09-12 DIAGNOSIS — Z87891 Personal history of nicotine dependence: Secondary | ICD-10-CM | POA: Diagnosis not present

## 2019-09-12 DIAGNOSIS — Z86718 Personal history of other venous thrombosis and embolism: Secondary | ICD-10-CM | POA: Diagnosis not present

## 2019-09-12 DIAGNOSIS — Z86010 Personal history of colonic polyps: Secondary | ICD-10-CM | POA: Insufficient documentation

## 2019-09-12 DIAGNOSIS — Z888 Allergy status to other drugs, medicaments and biological substances status: Secondary | ICD-10-CM | POA: Diagnosis not present

## 2019-09-12 DIAGNOSIS — H409 Unspecified glaucoma: Secondary | ICD-10-CM | POA: Diagnosis not present

## 2019-09-12 DIAGNOSIS — Z8249 Family history of ischemic heart disease and other diseases of the circulatory system: Secondary | ICD-10-CM | POA: Insufficient documentation

## 2019-09-12 DIAGNOSIS — I4891 Unspecified atrial fibrillation: Secondary | ICD-10-CM | POA: Diagnosis present

## 2019-09-12 DIAGNOSIS — D6869 Other thrombophilia: Secondary | ICD-10-CM

## 2019-09-12 DIAGNOSIS — Z882 Allergy status to sulfonamides status: Secondary | ICD-10-CM | POA: Diagnosis not present

## 2019-09-12 DIAGNOSIS — E785 Hyperlipidemia, unspecified: Secondary | ICD-10-CM | POA: Insufficient documentation

## 2019-09-12 DIAGNOSIS — M1990 Unspecified osteoarthritis, unspecified site: Secondary | ICD-10-CM | POA: Insufficient documentation

## 2019-09-12 DIAGNOSIS — Z79899 Other long term (current) drug therapy: Secondary | ICD-10-CM | POA: Insufficient documentation

## 2019-09-12 DIAGNOSIS — I480 Paroxysmal atrial fibrillation: Secondary | ICD-10-CM | POA: Insufficient documentation

## 2019-09-12 DIAGNOSIS — K219 Gastro-esophageal reflux disease without esophagitis: Secondary | ICD-10-CM | POA: Diagnosis not present

## 2019-09-12 DIAGNOSIS — Z833 Family history of diabetes mellitus: Secondary | ICD-10-CM | POA: Diagnosis not present

## 2019-09-12 DIAGNOSIS — I10 Essential (primary) hypertension: Secondary | ICD-10-CM | POA: Diagnosis not present

## 2019-09-12 DIAGNOSIS — Z7901 Long term (current) use of anticoagulants: Secondary | ICD-10-CM | POA: Insufficient documentation

## 2019-09-12 DIAGNOSIS — Z85828 Personal history of other malignant neoplasm of skin: Secondary | ICD-10-CM | POA: Diagnosis not present

## 2019-09-12 NOTE — Progress Notes (Signed)
Patient ID: EDKER BERGSTEN, male   DOB: [birth year], 79 y.o.   MRN: [id number]     Primary Care Physician: Kathyrn Drown, MD Referring Physician: [name] JOSHUAH FILKINS is a 79 y.o. male with a h/o PAF, afib ablation 2011. Marland Kitchen No afib other than around 12 hours on Christmas Day, 2017, after hearing about the death of his sister. He continues to take pradaxa on a  regular basis.  F/u 07/15/16. He reports just a few flutters now and then. He remains very active and actually had a rupture of his bicep tendon last September after digging up and pitching a rock about the size of a "coconut.". Surgery was not recommended due to his age and it has repaired to some degree with time. He continues on pradaxa with last bmet drawn 06/02/16 with creatinine of 1.10. No bleeding issues.   F/u afib clinic, 09/08/17. No further afib since December 2017. No issues with anticoagulation. Last bmet with stable creatinine with at 0.96.his only complaint is that he can not do as much work outside as he was doing last Spring." I just give out and have to stop." No chest pain or shortness of breath. Has a freind that felt the same way and he was found to need a stent. His energy improved a lot after the procedure.  Seeing pt in afib clinic 10/25/18  for c/o intermittent palpitations for the  last several weeks and fatigue. I had a virtual visit with pt 4/27 and he did not have these c/o's then. He was telling me about a fall from a ladder in the fall and broke his knee with LOC, "tore up left shoulder." He adds today that he feels that he has not been right since then. 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 I did a stress test lest year, it was read low risk but did have a few areas that ischemia could not be ruled out. He has some chest tightness with irregular heart beat. He is winded with activities. He is in SR today.   F/u in afib clinic  4/27. He reports  that he has done well over the last year without few episodes of afib. Usually will only last a few hours. Has some bruising but drinks 3 oz of [place] Minium every night. He remains on eliquis 5 mg bid with a CHA2DS2VASc of 3. He is due for his physical with his PCP next week and anticipates having labs drawn.   Today, he denies symptoms of palpitations  exertional  shortness of breath, orthopnea, PND, lower extremity edema, dizziness, presyncope, syncope, or neurologic sequela. + for exertional fatigue. The patient is tolerating medications without difficulties and is otherwise without complaint today.   Past Medical History:  Diagnosis Date  . Arthritis   . Cancer ([place])    skin  . Carpal tunnel syndrome   . Cataract    left eye  . Chronic back pain    multiple back surgeries  . Complication of anesthesia    pt states woke up during hemorroid surgery  . Constipation    Metamucil every mornind  . DVT (deep venous thrombosis) ([place]) 03/2007   left leg   . GERD (gastroesophageal reflux disease)    takes Protonix daily  . Glaucoma   . History of colon polyps   . History of hyperglycemia   .  History of MRSA infection 1992  . HTN (hypertension)    takes  Ramipril daily  . Hyperlipidemia    takes Pravastatin 3 times a week  . Joint pain   . Joint swelling   . Nocturia   . Paroxysmal atrial fibrillation (HCC)    takes Pradaxa daily, Chads2vasc score 2(07/11/14)  . Pneumonia    many yrs ago  . Pulmonary embolism ([place]) 2008  . Urinary frequency   . Urinary urgency    Past Surgical History:  Procedure Laterality Date  . BIOPSY  04/01/2018   Procedure: BIOPSY;  Surgeon: Rogene Houston, MD;  [facility];  Service: Endoscopy;;  gastric  . CARDIAC CATHETERIZATION  2008  . CARPAL TUNNEL RELEASE Right   . cataract surgery Right   . cervical and lumbar surgery    . COLONOSCOPY  04/24/2011   Procedure: COLONOSCOPY;  Surgeon: Rogene Houston, MD;  [facility];   Service: Endoscopy;  Laterality: N/A;  1200  . COLONOSCOPY N/A 08/13/2016   Procedure: COLONOSCOPY;  Surgeon: Rogene Houston, MD;  [facility];  Service: Endoscopy;  Laterality: N/A;  1200  . ESOPHAGOGASTRODUODENOSCOPY  03/10/2012   Procedure: ESOPHAGOGASTRODUODENOSCOPY (EGD);  Surgeon: Rogene Houston, MD;  [facility];  Service: Endoscopy;  Laterality: N/A;  325  . ESOPHAGOGASTRODUODENOSCOPY (EGD) WITH PROPOFOL N/A 04/01/2018   Procedure: ESOPHAGOGASTRODUODENOSCOPY (EGD) WITH PROPOFOL;  Surgeon: Rogene Houston, MD;  [facility];  Service: Endoscopy;  Laterality: N/A;  . HARDWARE REMOVAL N/A 04/24/2014   Procedure: REMOVAL OF SACRAL INSTRUMENTATION WITH [place];  Surgeon: Kristeen Miss, MD;  Location: [facility];  Service: Neurosurgery;  Laterality: N/A;  . HEMORRHOIDECTOMY WITH HEMORRHOID BANDING    . KNEE ARTHROSCOPY Right   . KNEE ARTHROSCOPY WITH MEDIAL MENISECTOMY Right 08/03/2018   Procedure: RIGHT KNEE ARTHROSCOPY WITH PARTIAL MEDIAL MENISCECTOMY;  Surgeon: Leandrew Koyanagi, MD;  Location: [facility];  Service: Orthopedics;  Laterality: Right;  . KNEE SURGERY     left  . LASIK    . POLYPECTOMY  08/13/2016   Procedure: POLYPECTOMY;  Surgeon: Rogene Houston, MD;  [facility];  Service: Endoscopy;;  colon  . PVI  01/24/10   afib ablation    Current Outpatient Medications  Medication Sig Dispense Refill  . amLODipine (NORVASC) 5 MG tablet Take 1 tablet (5 mg total) by mouth daily. (Patient taking differently: Take 10 mg by mouth daily. ) 90 tablet 1  . apixaban (ELIQUIS) 5 MG TABS tablet Take 1 tablet (5 mg total) by mouth 2 (two) times daily. 60 tablet 5  . diltiazem (CARDIZEM) 30 MG tablet Take 1/2 tablet every 4 hours AS NEEDED for afib 45 tablet 1  . diphenhydrAMINE-APAP, sleep, (TYLENOL PM EXTRA STRENGTH PO) Take 1 tablet by mouth at bedtime as needed (sleep).     . indapamide (LOZOL) 2.5 MG tablet Take 1 tablet (2.5 mg  total) by mouth daily. 90 tablet 1  . latanoprost (XALATAN) 0.005 % ophthalmic solution Place 1 drop into both eyes at bedtime.    Marland Kitchen OVER THE COUNTER MEDICATION Tylenol arthritis as needed    . Oxycodone HCl 10 MG TABS One 4 times a day prn pain 120 tablet 0  . Oxycodone HCl 10 MG TABS Take 1 tablet 4 times daily as needed for pain 120 tablet 0  . Oxycodone HCl 10 MG TABS Take 1 tablet 4 times daily as needed for pain  120 tablet 0  . pantoprazole (PROTONIX) 40 MG tablet Take 1 tablet (40 mg total) by mouth daily before breakfast. 90 tablet 3  . pravastatin (PRAVACHOL) 80 MG tablet Take 1 tablet (80 mg total) by mouth daily. 90 tablet 1  . ramipril (ALTACE) 10 MG capsule Take 1 capsule (10 mg total) by mouth 2 (two) times daily. 180 capsule 1   No current facility-administered medications for this encounter.    Allergies  Allergen Reactions  . Sectral [Acebutolol Hcl] Other (See Comments)    Side effects  . Sulfonamide Derivatives Other (See Comments)    Runs blood pressure up  . Zocor [Simvastatin] Other (See Comments)    Joint pain    Social History   Socioeconomic History  . Marital status: Married    Spouse name: Not on file  . Number of children: Not on file  . Years of education: Not on file  . Highest education level: Not on file  Occupational History  . Occupation: retired  Tobacco Use  . Smoking status: Former Smoker    Packs/day: 0.25    Years: 3.00    Pack years: 0.75    Types: Cigarettes    Quit date: 03/30/1969    Years since quitting: 50.4  . Smokeless tobacco: Never Used  Substance and Sexual Activity  . Alcohol use: Yes    Alcohol/week: 14.0 standard drinks    Types: 7 Glasses of wine, 7 Shots of liquor per week    Comment: wine nightly  . Drug use: No  . Sexual activity: Yes  Other Topics Concern  . Not on file  Social History Narrative  . Not on file   Social Determinants of Health   Financial Resource Strain:   . Difficulty of Paying Living  Expenses:   Food Insecurity:   . Worried About Charity fundraiser in the Last Year:   . Arboriculturist in the Last Year:   Transportation Needs:   . Film/video editor (Medical):   Marland Kitchen Lack of Transportation (Non-Medical):   Physical Activity:   . Days of Exercise per Week:   . Minutes of Exercise per Session:   Stress:   . Feeling of Stress :   Social Connections:   . Frequency of Communication with Friends and Family:   . Frequency of Social Gatherings with Friends and Family:   . Attends Religious Services:   . Active Member of Clubs or Organizations:   . Attends Archivist Meetings:   Marland Kitchen Marital Status:   Intimate Partner Violence:   . Fear of Current or Ex-Partner:   . Emotionally Abused:   Marland Kitchen Physically Abused:   . Sexually Abused:     Family History  Problem Relation Age of Onset  . Hypertension Mother   . Transient ischemic attack Mother   . Heart disease Father   . Diabetes Brother   . Healthy Daughter   . Colon cancer Sister   . Hypertension Other   . Heart disease Other     ROS- All systems are reviewed and negative except as per the HPI above  Physical Exam: Vitals:   09/12/19 1055  BP: (!) 146/90  Pulse: 69  Weight: 84.8 kg  Height: 5\' 6"  (1.676 m)    GEN- The patient is well appearing, alert and oriented x 3 today.   Head- normocephalic, atraumatic Eyes-  Sclera clear, conjunctiva pink Ears- hearing intact Oropharynx- clear Neck- supple, no JVP Lymph- no cervical  lymphadenopathy Lungs- Clear to ausculation bilaterally, normal work of breathing Heart- Regular rate and rhythm, no murmurs, rubs or gallops, PMI not laterally displaced GI- soft, NT, ND, + BS Extremities- no clubbing, cyanosis, or edema MS- no significant deformity or atrophy Skin- no rash or lesion Psych- euthymic mood, full affect Neuro- strength and sensation are intact  EKG- NSR at 65 bpm cannot rule out Inferior MI pr int 173 ms, qrs int 88 ms, qtc 418  ms Cannot rule out inferior MI  Epic records reviewed  Assessment and Plan: 1. Afib   he feels he has had a good year with low afib burden  Continue eliquis 5 mg bid  Chadsvasc score of at least 3  2.  HTN Stable   F/u with PCP as scheduled nest week  Will see back in one year   Butch Penny C. Aleaha Fickling, [facility] [address] [place], [place] [address] [phone]

## 2019-09-19 ENCOUNTER — Ambulatory Visit: Payer: PPO | Admitting: Urology

## 2019-09-24 ENCOUNTER — Encounter (HOSPITAL_COMMUNITY): Payer: Self-pay | Admitting: *Deleted

## 2019-09-24 ENCOUNTER — Emergency Department (HOSPITAL_COMMUNITY)
Admission: EM | Admit: 2019-09-24 | Discharge: 2019-09-25 | Disposition: A | Discharge status: Home / Self Care | Payer: PPO | Attending: Emergency Medicine | Admitting: Emergency Medicine

## 2019-09-24 ENCOUNTER — Emergency Department (HOSPITAL_COMMUNITY): Payer: PPO

## 2019-09-24 ENCOUNTER — Other Ambulatory Visit: Payer: Self-pay

## 2019-09-24 DIAGNOSIS — M62830 Muscle spasm of back: Secondary | ICD-10-CM | POA: Diagnosis not present

## 2019-09-24 DIAGNOSIS — Z85828 Personal history of other malignant neoplasm of skin: Secondary | ICD-10-CM | POA: Insufficient documentation

## 2019-09-24 DIAGNOSIS — Z87891 Personal history of nicotine dependence: Secondary | ICD-10-CM | POA: Insufficient documentation

## 2019-09-24 DIAGNOSIS — M545 Low back pain, unspecified: Secondary | ICD-10-CM

## 2019-09-24 DIAGNOSIS — Z7901 Long term (current) use of anticoagulants: Secondary | ICD-10-CM | POA: Insufficient documentation

## 2019-09-24 DIAGNOSIS — G8929 Other chronic pain: Secondary | ICD-10-CM | POA: Insufficient documentation

## 2019-09-24 DIAGNOSIS — Z79899 Other long term (current) drug therapy: Secondary | ICD-10-CM | POA: Diagnosis not present

## 2019-09-24 DIAGNOSIS — I10 Essential (primary) hypertension: Secondary | ICD-10-CM | POA: Insufficient documentation

## 2019-09-24 DIAGNOSIS — R109 Unspecified abdominal pain: Secondary | ICD-10-CM | POA: Insufficient documentation

## 2019-09-24 LAB — CBC WITH DIFFERENTIAL/PLATELET
Abs Immature Granulocytes: 0.03 10*3/uL (ref 0.00–0.07)
Basophils Absolute: 0.1 10*3/uL (ref 0.0–0.1)
Basophils Relative: 1 %
Eosinophils Absolute: 0.1 10*3/uL (ref 0.0–0.5)
Eosinophils Relative: 1 %
HCT: 47 % (ref 39.0–52.0)
Hemoglobin: 15.5 g/dL (ref 13.0–17.0)
Immature Granulocytes: 0 %
Lymphocytes Relative: 23 %
Lymphs Abs: 1.7 10*3/uL (ref 0.7–4.0)
MCH: 31.2 pg (ref 26.0–34.0)
MCHC: 33 g/dL (ref 30.0–36.0)
MCV: 94.6 fL (ref 80.0–100.0)
Monocytes Absolute: 0.6 10*3/uL (ref 0.1–1.0)
Monocytes Relative: 8 %
Neutro Abs: 4.8 10*3/uL (ref 1.7–7.7)
Neutrophils Relative %: 67 %
Platelets: 289 10*3/uL (ref 150–400)
RBC: 4.97 MIL/uL (ref 4.22–5.81)
RDW: 13.1 % (ref 11.5–15.5)
WBC: 7.2 10*3/uL (ref 4.0–10.5)
nRBC: 0 % (ref 0.0–0.2)

## 2019-09-24 LAB — COMPREHENSIVE METABOLIC PANEL
ALT: 24 U/L (ref 0–44)
AST: 29 U/L (ref 15–41)
Albumin: 5 g/dL (ref 3.5–5.0)
Alkaline Phosphatase: 67 U/L (ref 38–126)
Anion gap: 14 (ref 5–15)
BUN: 20 mg/dL (ref 8–23)
CO2: 27 mmol/L (ref 22–32)
Calcium: 9.5 mg/dL (ref 8.9–10.3)
Chloride: 98 mmol/L (ref 98–111)
Creatinine, Ser: 0.86 mg/dL (ref 0.61–1.24)
GFR calc Af Amer: >60 mL/min (ref >60)
GFR calc non Af Amer: >60 mL/min (ref >60)
Glucose, Bld: 120 mg/dL — ABNORMAL HIGH (ref 70–99)
Potassium: 3.6 mmol/L (ref 3.5–5.1)
Sodium: 139 mmol/L (ref 135–145)
Total Bilirubin: 1.1 mg/dL (ref 0.3–1.2)
Total Protein: 8.1 g/dL (ref 6.5–8.1)

## 2019-09-24 MED ORDER — HYDROMORPHONE HCL 1 MG/ML IJ SOLN
1.0000 mg | Freq: Once | INTRAMUSCULAR | Status: AC
  Administered 2019-09-24: 23:00:00 1 mg via INTRAVENOUS
  Filled 2019-09-24: qty 1

## 2019-09-24 MED ORDER — METHOCARBAMOL 1000 MG/10ML IJ SOLN
INTRAMUSCULAR | Status: AC
  Filled 2019-09-24: qty 10

## 2019-09-24 MED ORDER — LIDOCAINE 5 % EX PTCH
1.0000 | MEDICATED_PATCH | CUTANEOUS | Status: DC
  Administered 2019-09-24: 1 via TRANSDERMAL
  Filled 2019-09-24: qty 1

## 2019-09-24 MED ORDER — METHOCARBAMOL 1000 MG/10ML IJ SOLN
500.0000 mg | Freq: Once | INTRAVENOUS | Status: AC
  Administered 2019-09-24: 21:00:00 500 mg via INTRAVENOUS
  Filled 2019-09-24: qty 5

## 2019-09-24 MED ORDER — ONDANSETRON HCL 4 MG/2ML IJ SOLN
4.0000 mg | Freq: Once | INTRAMUSCULAR | Status: AC
  Administered 2019-09-24: 4 mg via INTRAVENOUS
  Filled 2019-09-24: qty 2

## 2019-09-24 MED ORDER — HYDROMORPHONE HCL 1 MG/ML IJ SOLN
1.0000 mg | Freq: Once | INTRAMUSCULAR | Status: AC
  Administered 2019-09-24: 20:00:00 1 mg via INTRAVENOUS
  Filled 2019-09-24: qty 1

## 2019-09-24 MED ORDER — FENTANYL CITRATE (PF) 100 MCG/2ML IJ SOLN
50.0000 ug | Freq: Once | INTRAMUSCULAR | Status: AC
  Administered 2019-09-24: 50 ug via INTRAVENOUS
  Filled 2019-09-24: qty 2

## 2019-09-24 MED ORDER — HYDROMORPHONE HCL 1 MG/ML IJ SOLN
1.0000 mg | Freq: Once | INTRAMUSCULAR | Status: AC
  Administered 2019-09-24: 1 mg via INTRAVENOUS
  Filled 2019-09-24: qty 1

## 2019-09-24 MED ORDER — DEXAMETHASONE SODIUM PHOSPHATE 10 MG/ML IJ SOLN
10.0000 mg | Freq: Once | INTRAMUSCULAR | Status: AC
  Administered 2019-09-24: 10 mg via INTRAVENOUS
  Filled 2019-09-24: qty 1

## 2019-09-24 MED ORDER — KETOROLAC TROMETHAMINE 30 MG/ML IJ SOLN
15.0000 mg | Freq: Once | INTRAMUSCULAR | Status: AC
  Administered 2019-09-24: 15 mg via INTRAVENOUS
  Filled 2019-09-24: qty 1

## 2019-09-24 NOTE — ED Triage Notes (Signed)
Patient presents to the ED with severe lower back pain after working on his car this morning.  Patient stated he moved wrong and then the pain began.  Patient has history of multiple back surgeries.

## 2019-09-24 NOTE — ED Provider Notes (Signed)
[facility] EMERGENCY DEPARTMENT Provider Note   CSN: [id number] Arrival date & time: 09/24/19  1826     History Chief Complaint  Patient presents with  . Back Pain    [name] is a 79 y.o. male with a history of chronic back pain with history of multiple surgeries of his lumbar spine, most recently in 2015 for hardware removal, was working underneath his truck this am when he "moved wrong" with sudden onset of midline to left lower back pain which has escalated this evening.  Movement causes severe pain, describing sharp pain but also waves of spasm with movement.  He takes oxycodone 10 mg qid at baseline which has not relieved his symptoms this evening.  He denies fevers, chills, weakness or numbness in his legs and has had no urinary or fecal incontinence or retention. He denies abdominal pain or distention, no n/v, dysuria or hematuria. Also denies chest pain, sob, no recent illnesses.   The history is provided by the patient.       Past Medical History:  Diagnosis Date  . Arthritis   . Cancer ([place])    skin  . Carpal tunnel syndrome   . Cataract    left eye  . Chronic back pain    multiple back surgeries  . Complication of anesthesia    pt states woke up during hemorroid surgery  . Constipation    Metamucil every mornind  . DVT (deep venous thrombosis) ([place]) 03/2007   left leg   . GERD (gastroesophageal reflux disease)    takes Protonix daily  . Glaucoma   . History of colon polyps   . History of hyperglycemia   . History of MRSA infection 1992  . HTN (hypertension)    takes  Ramipril daily  . Hyperlipidemia    takes Pravastatin 3 times a week  . Joint pain   . Joint swelling   . Nocturia   . Paroxysmal atrial fibrillation (HCC)    takes Pradaxa daily, Chads2vasc score 2(07/11/14)  . Pneumonia    many yrs ago  . Pulmonary embolism ([place]) 2008  . Urinary frequency   . Urinary urgency     Patient Active Problem List   Diagnosis Date Noted  . S/P right  knee arthroscopy 08/10/2018  . Acute tear lateral meniscus, right, initial encounter 08/03/2018  . Acute medial meniscus tear, right, subsequent encounter 07/13/2018  . Melena 03/28/2018  . Closed fracture of lateral portion of left tibial plateau 03/09/2018  . Aortic atherosclerosis ([place]) 11/05/2016  . History of colonic polyps 06/16/2016  . Pseudoarthrosis of lumbar spine 04/24/2014  . Hx of adenomatous colonic polyps 11/22/2013  . Prediabetes 10/12/2013  . Fatigue 12/21/2012  . Snoring 12/21/2012  . Chronic back pain greater than 3 months duration 10/31/2012  . Sinusitis 02/22/2012  . GERD (gastroesophageal reflux disease) 02/22/2012  . Hyperlipidemia 01/21/2012  . HYPERTENSION, BENIGN 12/06/2009  . ATRIAL FIBRILLATION, PAROXYSMAL 12/06/2009    Past Surgical History:  Procedure Laterality Date  . BIOPSY  04/01/2018   Procedure: BIOPSY;  Surgeon: Rogene Houston, MD;  [facility];  Service: Endoscopy;;  gastric  . CARDIAC CATHETERIZATION  2008  . CARPAL TUNNEL RELEASE Right   . cataract surgery Right   . cervical and lumbar surgery    . COLONOSCOPY  04/24/2011   Procedure: COLONOSCOPY;  Surgeon: Rogene Houston, MD;  Location: [facility];  Service: Endoscopy;  Laterality: N/A;  1200  . COLONOSCOPY N/A 08/13/2016  Procedure: COLONOSCOPY;  Surgeon: Rogene Houston, MD;  [facility];  Service: Endoscopy;  Laterality: N/A;  1200  . ESOPHAGOGASTRODUODENOSCOPY  03/10/2012   Procedure: ESOPHAGOGASTRODUODENOSCOPY (EGD);  Surgeon: Rogene Houston, MD;  [facility];  Service: Endoscopy;  Laterality: N/A;  325  . ESOPHAGOGASTRODUODENOSCOPY (EGD) WITH PROPOFOL N/A 04/01/2018   Procedure: ESOPHAGOGASTRODUODENOSCOPY (EGD) WITH PROPOFOL;  Surgeon: Rogene Houston, MD;  [facility];  Service: Endoscopy;  Laterality: N/A;  . HARDWARE REMOVAL N/A 04/24/2014   Procedure: REMOVAL OF SACRAL INSTRUMENTATION WITH [place];  Surgeon: Kristeen Miss, MD;   Location: [place] [facility];  Service: Neurosurgery;  Laterality: N/A;  . HEMORRHOIDECTOMY WITH HEMORRHOID BANDING    . KNEE ARTHROSCOPY Right   . KNEE ARTHROSCOPY WITH MEDIAL MENISECTOMY Right 08/03/2018   Procedure: RIGHT KNEE ARTHROSCOPY WITH PARTIAL MEDIAL MENISCECTOMY;  Surgeon: Leandrew Koyanagi, MD;  Location: [facility];  Service: Orthopedics;  Laterality: Right;  . KNEE SURGERY     left  . LASIK    . POLYPECTOMY  08/13/2016   Procedure: POLYPECTOMY;  Surgeon: Rogene Houston, MD;  [facility];  Service: Endoscopy;;  colon  . PVI  01/24/10   afib ablation       Family History  Problem Relation Age of Onset  . Hypertension Mother   . Transient ischemic attack Mother   . Heart disease Father   . Diabetes Brother   . Healthy Daughter   . Colon cancer Sister   . Hypertension Other   . Heart disease Other     Social History   Tobacco Use  . Smoking status: Former Smoker    Packs/day: 0.25    Years: 3.00    Pack years: 0.75    Types: Cigarettes    Quit date: 03/30/1969    Years since quitting: 50.5  . Smokeless tobacco: Never Used  Substance Use Topics  . Alcohol use: Yes    Alcohol/week: 14.0 standard drinks    Types: 7 Glasses of wine, 7 Shots of liquor per week    Comment: wine nightly  . Drug use: No    Home Medications Prior to Admission medications   Medication Sig Start Date End Date Taking? Authorizing Provider  acetaminophen (TYLENOL 8 HOUR ARTHRITIS PAIN) 650 MG CR tablet Take 1,300 mg by mouth once as needed for pain.   Yes [provider]  amLODipine (NORVASC) 5 MG tablet Take 1 tablet (5 mg total) by mouth daily. Patient taking differently: Take 10 mg by mouth in the morning.  06/05/19  Yes Kathyrn Drown, MD  apixaban (ELIQUIS) 5 MG TABS tablet Take 1 tablet (5 mg total) by mouth 2 (two) times daily. 06/05/19  Yes Luking, Elayne Snare, MD  diltiazem (CARDIZEM) 30 MG tablet Take 1/2 tablet every 4 hours AS NEEDED for afib Patient  taking differently: Take 15 mg by mouth See admin instructions. Take 1/2 tablet every 4 hours AS NEEDED for afib 10/11/18  Yes Sherran Needs, NP  diphenhydrAMINE-APAP, sleep, (TYLENOL PM EXTRA STRENGTH PO) Take 1 tablet by mouth at bedtime.    Yes [provider]  indapamide (LOZOL) 2.5 MG tablet Take 1 tablet (2.5 mg total) by mouth daily. 06/05/19  Yes Luking, Scott A, MD  latanoprost (XALATAN) 0.005 % ophthalmic solution Place 1 drop into both eyes at bedtime.   Yes [provider]  Oxycodone HCl 10 MG TABS One 4 times a day prn pain Patient taking  differently: Take 10 mg by mouth 4 (four) times daily as needed (for pain).  08/29/19  Yes Kathyrn Drown, MD  pantoprazole (PROTONIX) 40 MG tablet Take 1 tablet (40 mg total) by mouth daily before breakfast. 08/14/19  Yes Rehman, Mechele Dawley, MD  pravastatin (PRAVACHOL) 80 MG tablet Take 1 tablet (80 mg total) by mouth daily. Patient taking differently: Take 80 mg by mouth at bedtime.  06/05/19  Yes Kathyrn Drown, MD  ramipril (ALTACE) 10 MG capsule Take 1 capsule (10 mg total) by mouth 2 (two) times daily. 06/05/19  Yes Kathyrn Drown, MD  Oxycodone HCl 10 MG TABS Take 1 tablet 4 times daily as needed for pain Patient not taking: Reported on 09/24/2019 08/29/19   Kathyrn Drown, MD  Oxycodone HCl 10 MG TABS Take 1 tablet 4 times daily as needed for pain Patient not taking: Reported on 09/24/2019 08/29/19   Kathyrn Drown, MD    Allergies    Sectral [acebutolol hcl], Sulfonamide derivatives, and Zocor [simvastatin]  Review of Systems   Review of Systems  Constitutional: Negative for chills and fever.  Respiratory: Negative for shortness of breath.   Cardiovascular: Negative for chest pain and leg swelling.  Gastrointestinal: Negative for abdominal distention, abdominal pain and constipation.  Genitourinary: Negative for difficulty urinating, dysuria, flank pain, frequency and urgency.  Musculoskeletal: Positive for back pain.  Negative for gait problem and joint swelling.  Skin: Negative for rash.  Neurological: Negative for weakness and numbness.    Physical Exam Updated Vital Signs BP (!) 156/74   Pulse 71   Temp 97.7 F (36.5 C) (Oral)   Resp 16   Ht 5\' 6"  (1.676 m)   Wt 84 kg   SpO2 91%   BMI 29.89 kg/m   Physical Exam Vitals and nursing note reviewed.  Constitutional:      General: He is in acute distress.     Appearance: He is well-developed.     Comments: Pt lying on his side, writhing and holding his lower back.   HENT:     Head: Normocephalic.  Eyes:     Conjunctiva/sclera: Conjunctivae normal.  Cardiovascular:     Rate and Rhythm: Normal rate.     Comments: Pedal pulses normal. Pulmonary:     Effort: Pulmonary effort is normal.  Abdominal:     General: Abdomen is protuberant. Bowel sounds are normal. There is no distension or abdominal bruit.     Palpations: Abdomen is soft. There is no mass or pulsatile mass.     Tenderness: There is no abdominal tenderness. There is no guarding.  Musculoskeletal:        General: Normal range of motion.     Cervical back: Normal range of motion and neck supple.     Lumbar back: Tenderness present. No swelling, edema or spasms.     Comments: Well healed incision lumbar midline. Tenderness left paralumbar region, no palpable spasm, no midline pain or edema, no deformity.    Skin:    General: Skin is warm and dry.  Neurological:     Mental Status: He is alert.     Sensory: No sensory deficit.     Motor: No tremor or atrophy.     Gait: Gait normal.     Deep Tendon Reflexes:     Reflex Scores:      Patellar reflexes are 2+ on the right side and 2+ on the left side.    Comments: No strength deficit  noted in hip and knee flexor and extensor muscle groups.  Ankle flexion and extension intact.     ED Results / Procedures / Treatments   Labs (all labs ordered are listed, but only abnormal results are displayed) Labs Reviewed  URINALYSIS, ROUTINE  W REFLEX MICROSCOPIC - Abnormal; Notable for the following components:      Result Value   Ketones, ur 20 (*)    Protein, ur 100 (*)    All other components within normal limits  COMPREHENSIVE METABOLIC PANEL - Abnormal; Notable for the following components:   Glucose, Bld 120 (*)    All other components within normal limits  CBC WITH DIFFERENTIAL/PLATELET    EKG None  Radiology CT Renal Stone Study  Result Date: 09/24/2019 CLINICAL DATA:  Bilateral lower back pain, left flank pain radiating to left groin, polyuria EXAM: CT ABDOMEN AND PELVIS WITHOUT CONTRAST TECHNIQUE: Multidetector CT imaging of the abdomen and pelvis was performed following the standard protocol without IV contrast. COMPARISON:  11/04/2016 FINDINGS: Lower chest: No acute pleural or parenchymal lung disease. Hepatobiliary: No focal liver abnormality is seen. No gallstones, gallbladder wall thickening, or biliary dilatation. Pancreas: Unremarkable. No pancreatic ductal dilatation or surrounding inflammatory changes. Spleen: Normal in size without focal abnormality. Adrenals/Urinary Tract: No urinary tract calculi or obstructive uropathy within either kidney. The adrenals are normal. Bladder is moderately distended with no focal abnormalities. Stomach/Bowel: No bowel obstruction or ileus. Normal appendix right lower quadrant. Diverticulosis of the descending and sigmoid colon with no evidence of diverticulitis. Vascular/Lymphatic: Aortic atherosclerosis. No enlarged abdominal or pelvic lymph nodes. Reproductive: Prostate is enlarged but stable. Other: No free fluid or free intraperitoneal gas. Stable fat containing left inguinal hernia. No bowel herniation. Musculoskeletal: There are no acute or destructive bony lesions. Postsurgical changes are seen spanning L1 through S1 with multilevel discectomies and posterior fusion. No acute bony abnormalities. Reconstructed images demonstrate no additional findings. IMPRESSION: 1. No urinary  tract calculi or obstructive uropathy. 2. Diverticulosis without diverticulitis. 3. Stable fat containing left inguinal hernia. 4. Aortic Atherosclerosis (ICD10-I70.0). Electronically Signed   By: Randa Ngo M.D.   On: 09/24/2019 23:07    Procedures Procedures (including critical care time)  Medications Ordered in ED Medications  lidocaine (LIDODERM) 5 % 1 patch (1 patch Transdermal Patch Applied 09/24/19 2106)  HYDROmorphone (DILAUDID) injection 1 mg (1 mg Intravenous Given 09/24/19 1926)  ondansetron (ZOFRAN) injection 4 mg (4 mg Intravenous Given 09/24/19 1926)  HYDROmorphone (DILAUDID) injection 1 mg (1 mg Intravenous Given 09/24/19 2018)  methocarbamol (ROBAXIN) 500 mg in dextrose 5 % 50 mL IVPB (0 mg Intravenous Stopped 09/24/19 2149)  dexamethasone (DECADRON) injection 10 mg (10 mg Intravenous Given 09/24/19 2034)  fentaNYL (SUBLIMAZE) injection 50 mcg (50 mcg Intravenous Given 09/24/19 2213)  ketorolac (TORADOL) 30 MG/ML injection 15 mg (15 mg Intravenous Given 09/24/19 2213)  HYDROmorphone (DILAUDID) injection 1 mg (1 mg Intravenous Given 09/24/19 2240)  HYDROmorphone (DILAUDID) injection 1 mg (1 mg Intravenous Given 09/24/19 2357)    ED Course  I have reviewed the triage vital signs and the nursing notes.  Pertinent labs & imaging results that were available during my care of the patient were reviewed by me and considered in my medical decision making (see chart for details).    MDM Rules/Calculators/A&P                      Patient was given multiple medicines in order to obtain pain relief.  Initially he was  given a dose of Dilaudid 1 mg IV, Decadron 10 mg and Robaxin 500 mg.  He continued to have severe pain without relief, added another dose of Dilaudid.  At reexam his pain was unchanged, switched to fentanyl 50 mcg which also did not improve his symptoms.  At this time he reported his pain seemed to be shifting to his left lateral side.  Labs were ordered and he was sent for a CT renal  stone study which was negative for stone or other acute finding.  He was also given a topical Lidoderm patch and an additional dose of Dilaudid.   12:32 AM Pt reports slight transient improvement in pain which has now escalated and is severe.  Additional dose of Dilaudid 1 mg ordered  With no improvement in pain. He was offered admission for pain control, at first refused, but then decided to stay given level of pain. Discussed with Dr. Humphrey Rolls but with no clear indication for hospital admission, deferred admission.  He was given robaxin and prednisone taper prescriptions.  Plan f/u with his pcp for a recheck early this week if sx not improving with this tx plan.   Pt was seen by Dr. Sabra Heck during this visit.  Final Clinical Impression(s) / ED Diagnoses Final diagnoses:  Acute left-sided low back pain without sciatica    Rx / DC Orders ED Discharge Orders    None       Landis Martins 09/25/19 0108    Noemi Chapel, MD 09/25/19 1432

## 2019-09-24 NOTE — ED Provider Notes (Signed)
This patient is a pleasant 79 year old male presenting with acute onset of lower back pain, he has had this multiple times in the past, the last time several years ago, he required multiple medications including opiates, benzos and ultimately steroids.  He is followed up with back surgery in the past, he has had multiple surgical procedures and when I look at his back he does have midline scars.  He is able to move all 4 extremities, there is no numbness or weakness of the legs, any movement triggers a painful flareup.  I suspect this is muscular, the patient will be given medications including a muscle relaxant, IV hydromorphone as well as Decadron, I anticipate that he will be able to go home and has been advised that this may go on for neck several days or even a week however I do not think this is an acute neurologic decompensation.  Medical screening examination/treatment/procedure(s) were conducted as a shared visit with non-physician practitioner(s) and myself.  I personally evaluated the patient during the encounter.  Clinical Impression:   Final diagnoses:  Acute left-sided low back pain without sciatica  Muscle spasm of back         Noemi Chapel, MD 09/25/19 1432

## 2019-09-25 LAB — URINALYSIS, ROUTINE W REFLEX MICROSCOPIC
Bacteria, UA: NONE SEEN
Bilirubin Urine: NEGATIVE
Glucose, UA: NEGATIVE mg/dL
Hgb urine dipstick: NEGATIVE
Ketones, ur: 20 mg/dL — AB
Leukocytes,Ua: NEGATIVE
Nitrite: NEGATIVE
Protein, ur: 100 mg/dL — AB
Specific Gravity, Urine: 1.017 (ref 1.005–1.030)
pH: 6 (ref 5.0–8.0)

## 2019-09-25 MED ORDER — PREDNISONE 10 MG PO TABS
ORAL_TABLET | ORAL | 0 refills | Status: DC
Start: 2019-09-25 — End: 2019-09-27

## 2019-09-25 MED ORDER — METHOCARBAMOL 750 MG PO TABS
750.0000 mg | ORAL_TABLET | Freq: Four times a day (QID) | ORAL | 0 refills | Status: DC | PRN
Start: 2019-09-25 — End: 2019-11-28

## 2019-09-25 NOTE — Discharge Instructions (Signed)
Continue to take your home oxycodone.  Add the Robaxin which is a muscle relaxer and the prednisone as prescribed.  You may want to try heating pad applied to your lower back for 20 minutes several times daily.  You can continue to use ice packs as frequently as is comfortable as well.  Your lab tests and your CT imaging tonight are reassuring.  It is recommended that you follow-up with your primary MD for recheck if your symptoms are not better with this treatment plan.

## 2019-09-27 ENCOUNTER — Emergency Department (HOSPITAL_COMMUNITY): Payer: PPO

## 2019-09-27 ENCOUNTER — Other Ambulatory Visit: Payer: Self-pay

## 2019-09-27 ENCOUNTER — Emergency Department (HOSPITAL_COMMUNITY)
Admission: EM | Admit: 2019-09-27 | Discharge: 2019-09-27 | Disposition: A | Discharge status: Home / Self Care | Payer: PPO | Attending: Emergency Medicine | Admitting: Emergency Medicine

## 2019-09-27 ENCOUNTER — Encounter (HOSPITAL_COMMUNITY): Payer: Self-pay | Admitting: Emergency Medicine

## 2019-09-27 DIAGNOSIS — I10 Essential (primary) hypertension: Secondary | ICD-10-CM | POA: Insufficient documentation

## 2019-09-27 DIAGNOSIS — M546 Pain in thoracic spine: Secondary | ICD-10-CM | POA: Diagnosis not present

## 2019-09-27 DIAGNOSIS — Z7901 Long term (current) use of anticoagulants: Secondary | ICD-10-CM | POA: Insufficient documentation

## 2019-09-27 DIAGNOSIS — M545 Low back pain: Secondary | ICD-10-CM | POA: Diagnosis not present

## 2019-09-27 DIAGNOSIS — M5134 Other intervertebral disc degeneration, thoracic region: Secondary | ICD-10-CM | POA: Diagnosis not present

## 2019-09-27 DIAGNOSIS — Z87891 Personal history of nicotine dependence: Secondary | ICD-10-CM | POA: Insufficient documentation

## 2019-09-27 DIAGNOSIS — Z79899 Other long term (current) drug therapy: Secondary | ICD-10-CM | POA: Insufficient documentation

## 2019-09-27 LAB — COMPREHENSIVE METABOLIC PANEL
ALT: 23 U/L (ref 0–44)
AST: 25 U/L (ref 15–41)
Albumin: 4 g/dL (ref 3.5–5.0)
Alkaline Phosphatase: 54 U/L (ref 38–126)
Anion gap: 12 (ref 5–15)
BUN: 25 mg/dL — ABNORMAL HIGH (ref 8–23)
CO2: 28 mmol/L (ref 22–32)
Calcium: 9.5 mg/dL (ref 8.9–10.3)
Chloride: 99 mmol/L (ref 98–111)
Creatinine, Ser: 1.15 mg/dL (ref 0.61–1.24)
GFR calc Af Amer: >60 mL/min (ref >60)
GFR calc non Af Amer: >60 mL/min (ref >60)
Glucose, Bld: 122 mg/dL — ABNORMAL HIGH (ref 70–99)
Potassium: 3.3 mmol/L — ABNORMAL LOW (ref 3.5–5.1)
Sodium: 139 mmol/L (ref 135–145)
Total Bilirubin: 1.1 mg/dL (ref 0.3–1.2)
Total Protein: 7.1 g/dL (ref 6.5–8.1)

## 2019-09-27 LAB — CBC WITH DIFFERENTIAL/PLATELET
Abs Immature Granulocytes: 0.03 10*3/uL (ref 0.00–0.07)
Basophils Absolute: 0 10*3/uL (ref 0.0–0.1)
Basophils Relative: 0 %
Eosinophils Absolute: 0 10*3/uL (ref 0.0–0.5)
Eosinophils Relative: 0 %
HCT: 46.7 % (ref 39.0–52.0)
Hemoglobin: 15.3 g/dL (ref 13.0–17.0)
Immature Granulocytes: 0 %
Lymphocytes Relative: 17 %
Lymphs Abs: 1.6 10*3/uL (ref 0.7–4.0)
MCH: 30.7 pg (ref 26.0–34.0)
MCHC: 32.8 g/dL (ref 30.0–36.0)
MCV: 93.6 fL (ref 80.0–100.0)
Monocytes Absolute: 1 10*3/uL (ref 0.1–1.0)
Monocytes Relative: 11 %
Neutro Abs: 6.7 10*3/uL (ref 1.7–7.7)
Neutrophils Relative %: 72 %
Platelets: 268 10*3/uL (ref 150–400)
RBC: 4.99 MIL/uL (ref 4.22–5.81)
RDW: 12.7 % (ref 11.5–15.5)
WBC: 9.4 10*3/uL (ref 4.0–10.5)
nRBC: 0 % (ref 0.0–0.2)

## 2019-09-27 LAB — URINALYSIS, ROUTINE W REFLEX MICROSCOPIC
Bilirubin Urine: NEGATIVE
Glucose, UA: NEGATIVE mg/dL
Hgb urine dipstick: NEGATIVE
Ketones, ur: NEGATIVE mg/dL
Leukocytes,Ua: NEGATIVE
Nitrite: NEGATIVE
Protein, ur: NEGATIVE mg/dL
Specific Gravity, Urine: 1.02 (ref 1.005–1.030)
pH: 6 (ref 5.0–8.0)

## 2019-09-27 MED ORDER — OXYCODONE HCL 5 MG PO TABS
10.0000 mg | ORAL_TABLET | Freq: Once | ORAL | Status: AC
  Administered 2019-09-27: 10 mg via ORAL
  Filled 2019-09-27: qty 2

## 2019-09-27 MED ORDER — DEXAMETHASONE SODIUM PHOSPHATE 10 MG/ML IJ SOLN
10.0000 mg | Freq: Once | INTRAMUSCULAR | Status: AC
  Administered 2019-09-27: 10 mg via INTRAVENOUS
  Filled 2019-09-27: qty 1

## 2019-09-27 MED ORDER — HYDROMORPHONE HCL 1 MG/ML IJ SOLN
1.0000 mg | Freq: Once | INTRAMUSCULAR | Status: AC
  Administered 2019-09-27: 1 mg via INTRAVENOUS
  Filled 2019-09-27: qty 1

## 2019-09-27 MED ORDER — OXYCODONE HCL 10 MG PO TABS: 10.0000 mg | ORAL_TABLET | Freq: Four times a day (QID) | ORAL | 0 refills | Status: DC | PRN

## 2019-09-27 MED ORDER — PREDNISONE 20 MG PO TABS
ORAL_TABLET | ORAL | 0 refills | Status: DC
Start: 2019-09-27 — End: 2019-11-28

## 2019-09-27 MED ORDER — HYDROMORPHONE HCL 1 MG/ML IJ SOLN
1.0000 mg | Freq: Once | INTRAMUSCULAR | Status: AC
  Administered 2019-09-27: 13:00:00 1 mg via INTRAVENOUS
  Filled 2019-09-27: qty 1

## 2019-09-27 NOTE — ED Provider Notes (Signed)
Received signout at the beginning of shift, please see previous providers notes for complete H&P.  This is a 79 year old male with history of chronic back pain, multiple back surgeries, presenting with severe left lower back pain ongoing for the past 3 days.  He has received multiple dose of pain medication in the ED without relief.  Neurosurgeon, Dr. Ellene Route, was consulted and recommended MRI of his thoracic and lumbar spine.  Patient is currently awaits for MRI.  He continues to endorse pain and requesting for additional pain medication.  4:32 PM MRI of the thoracic and lumbar spine was obtained showing no acute fracture or mass.  However there are advanced multilevel degenerative changes most severe in the lower thoracic spine and there are bone marrow edema in the vertebral endplates extending into the posterior elements.  This finding was initially discussed with Dr. Ellene Route by telephone.  5:33 PM Appreciate consultation from neurosurgeon Dr. Ellene Route, who felt that the finding on the MRI did not show any significant changes from prior.  He recommend patient to receive Decadron, as well as the proper pain medication to go home if patient decides to go home however if patient decides to stay, patient can be admitted for an epidural injection.  Otherwise he can receive epidural injection outpatient.  6:06 PM Pt prefers to go home.  Will d/c with pain medication and prednisone.  He will f/u closely with dr. Ellene Route for epidural.  BP 136/79   Pulse (!) 56   Temp 98 F (36.7 C) (Oral)   Resp 16   Ht 5\' 6"  (1.676 m)   Wt 84 kg   SpO2 95%   BMI 29.89 kg/m   Results for orders placed or performed during the hospital encounter of 09/27/19  CBC with Differential  Result Value Ref Range   WBC 9.4 4.0 - 10.5 K/uL   RBC 4.99 4.22 - 5.81 MIL/uL   Hemoglobin 15.3 13.0 - 17.0 g/dL   HCT 46.7 39.0 - 52.0 %   MCV 93.6 80.0 - 100.0 fL   MCH 30.7 26.0 - 34.0 pg   MCHC 32.8 30.0 - 36.0 g/dL   RDW 12.7  11.5 - 15.5 %   Platelets 268 150 - 400 K/uL   nRBC 0.0 0.0 - 0.2 %   Neutrophils Relative % 72 %   Neutro Abs 6.7 1.7 - 7.7 K/uL   Lymphocytes Relative 17 %   Lymphs Abs 1.6 0.7 - 4.0 K/uL   Monocytes Relative 11 %   Monocytes Absolute 1.0 0.1 - 1.0 K/uL   Eosinophils Relative 0 %   Eosinophils Absolute 0.0 0.0 - 0.5 K/uL   Basophils Relative 0 %   Basophils Absolute 0.0 0.0 - 0.1 K/uL   Immature Granulocytes 0 %   Abs Immature Granulocytes 0.03 0.00 - 0.07 K/uL  Comprehensive metabolic panel  Result Value Ref Range   Sodium 139 135 - 145 mmol/L   Potassium 3.3 (L) 3.5 - 5.1 mmol/L   Chloride 99 98 - 111 mmol/L   CO2 28 22 - 32 mmol/L   Glucose, Bld 122 (H) 70 - 99 mg/dL   BUN 25 (H) 8 - 23 mg/dL   Creatinine, Ser 1.15 0.61 - 1.24 mg/dL   Calcium 9.5 8.9 - 10.3 mg/dL   Total Protein 7.1 6.5 - 8.1 g/dL   Albumin 4.0 3.5 - 5.0 g/dL   AST 25 15 - 41 U/L   ALT 23 0 - 44 U/L   Alkaline Phosphatase 54 38 -  126 U/L   Total Bilirubin 1.1 0.3 - 1.2 mg/dL   GFR calc non Af Amer >60 >60 mL/min   GFR calc Af Amer >60 >60 mL/min   Anion gap 12 5 - 15  Urinalysis, Routine w reflex microscopic  Result Value Ref Range   Color, Urine YELLOW YELLOW   APPearance CLEAR CLEAR   Specific Gravity, Urine 1.020 1.005 - 1.030   pH 6.0 5.0 - 8.0   Glucose, UA NEGATIVE NEGATIVE mg/dL   Hgb urine dipstick NEGATIVE NEGATIVE   Bilirubin Urine NEGATIVE NEGATIVE   Ketones, ur NEGATIVE NEGATIVE mg/dL   Protein, ur NEGATIVE NEGATIVE mg/dL   Nitrite NEGATIVE NEGATIVE   Leukocytes,Ua NEGATIVE NEGATIVE   MR THORACIC SPINE WO CONTRAST  Result Date: 09/27/2019 CLINICAL DATA:  Low back pain.  Rule out fracture. EXAM: MRI THORACIC SPINE WITHOUT CONTRAST TECHNIQUE: Multiplanar, multisequence MR imaging of the thoracic spine was performed. No intravenous contrast was administered. COMPARISON:  Thoracic CT myelogram 08/20/2010 FINDINGS: Alignment:  Normal alignment Vertebrae: Negative for acute fracture or  mass. Mild chronic compression fracture T4. Cord: Normal spinal cord signal. No cord lesion or cord compression. Paraspinal and other soft tissues: Negative for paraspinous mass or soft tissue edema. Disc levels: C7-T1: ACDF without stenosis T1-2: Moderate to advanced disc degeneration and spurring. Moderate foraminal encroachment bilaterally due to spurring. Central canal patent T2-3: Bilateral facet degeneration. Mild foraminal stenosis bilaterally T3-4: Mild disc and facet degeneration.  Negative for stenosis. T4-5: Mild disc and mild facet degeneration.  Negative for stenosis T5-6: Mild disc and facet degeneration.  Negative for stenosis T6-7: Mild disc and facet degeneration.  Negative for stenosis T7-8: Mild disc and facet degeneration. Small left-sided disc protrusion. Negative for stenosis T8-9: Moderate disc degeneration with diffuse endplate spurring. Mild facet degeneration. Mild foraminal stenosis bilaterally T9-10: Advanced disc degeneration with bone marrow edema in the endplates extending into the pedicles and posterior elements bilaterally. There is advanced facet degeneration with facet joint effusions. There is severe foraminal encroachment bilaterally due to bony overgrowth. Mild spinal stenosis. T10-11: Advanced disc and facet degeneration. Moderate spinal stenosis with severe foraminal stenosis bilaterally. T11-12: Advanced disc and facet degeneration with extensive spurring. Moderate spinal stenosis with severe foraminal encroachment bilaterally. T12-L1: Mild disc bulging.  Moderate left foraminal encroachment. IMPRESSION: 1. Negative for acute fracture or mass 2. Advanced multilevel degenerative change most severe in the lower thoracic spine. There is extensive facet degeneration in the thoracic spine again most severe in the lower thoracic spine 3. Advanced disc degeneration and facet degeneration at T9-10 with bone marrow edema in the vertebral endplates extending into the posterior  elements. This is likely due to ongoing degenerative change and could be a cause of pain. Pattern is not likely due to infection. 4. Findings discussed with Dr. Ellene Route by telephone Electronically Signed   By: Franchot Gallo M.D.   On: 09/27/2019 15:59   MR LUMBAR SPINE WO CONTRAST  Result Date: 09/27/2019 CLINICAL DATA:  Low back pain rule out compression fracture EXAM: MRI LUMBAR SPINE WITHOUT CONTRAST TECHNIQUE: Multiplanar, multisequence MR imaging of the lumbar spine was performed. No intravenous contrast was administered. COMPARISON:  Lumbar MRI 01/31/2019 FINDINGS: Segmentation:  Normal Alignment: Mild retrolisthesis T11-12 and T12-L1. Mild anterolisthesis L3-4 with solid fusion. Vertebrae:  Negative for fracture or mass. Conus medullaris and cauda equina: Conus extends to the L1-2 level. Conus and cauda equina appear normal. Paraspinal and other soft tissues: Complex fluid collection posterior to the dura in the laminectomy  bed extending from L2-3 through L5-S1 is unchanged from the prior study. No paraspinous mass or adenopathy. Disc levels: T11-12: Advanced disc degeneration and advanced facet degeneration. Mild to moderate spinal stenosis. Moderate foraminal encroachment bilaterally due to spurring. T12-L1: Mild disc bulging and moderate facet hypertrophy. Mild spinal stenosis and mild right foraminal encroachment. Moderate left foraminal encroachment. L1-2: Pedicle screw fusion bilaterally. No interbody fusion. Mild disc bulging. Mild subarticular stenosis on the left. No interval change. L2-3: Pedicle screw and solid interbody fusion. Posterior decompression. Negative for stenosis. L3-4: Pedicle screw and interbody fusion which is solid. No significant stenosis. L4-5: Pedicle screw fusion and solid interbody fusion. Negative for spinal or foraminal stenosis L5-S1: Interbody fusion. This appears to be a solid fusion on the CT of 09/24/2019. No pedicle screws at S1. Diffuse endplate spurring with  bilateral facet degeneration. Mild to moderate subarticular stenosis bilaterally. IMPRESSION: 1. Negative for lumbar fracture.  No evidence of spinal infection. 2. Solid fusion L2 through S1. Pedicle screw fusion L1-2 without interbody fusion. 3. Complex fluid collection in the laminectomy bed L2-3 through L5-S1 is stable. 4. Results discussed with Dr. Ellene Route by telephone. Electronically Signed   By: Franchot Gallo M.D.   On: 09/27/2019 15:50   US Guided Needle Placement  Result Date: 09/08/2019 Ultrasound-guided left glenohumeral injection: After sterile prep with Betadine, injected 8 cc 1% lidocaine without epinephrine and 40 mg methylprednisolone using a 22-gauge spinal needle, passing the needle from posterior approach into the glenohumeral joint.  Injectate was seen filling the joint capsule.  He had excellent immediate relief.   CT Renal Stone Study  Result Date: 09/24/2019 CLINICAL DATA:  Bilateral lower back pain, left flank pain radiating to left groin, polyuria EXAM: CT ABDOMEN AND PELVIS WITHOUT CONTRAST TECHNIQUE: Multidetector CT imaging of the abdomen and pelvis was performed following the standard protocol without IV contrast. COMPARISON:  11/04/2016 FINDINGS: Lower chest: No acute pleural or parenchymal lung disease. Hepatobiliary: No focal liver abnormality is seen. No gallstones, gallbladder wall thickening, or biliary dilatation. Pancreas: Unremarkable. No pancreatic ductal dilatation or surrounding inflammatory changes. Spleen: Normal in size without focal abnormality. Adrenals/Urinary Tract: No urinary tract calculi or obstructive uropathy within either kidney. The adrenals are normal. Bladder is moderately distended with no focal abnormalities. Stomach/Bowel: No bowel obstruction or ileus. Normal appendix right lower quadrant. Diverticulosis of the descending and sigmoid colon with no evidence of diverticulitis. Vascular/Lymphatic: Aortic atherosclerosis. No enlarged abdominal or pelvic  lymph nodes. Reproductive: Prostate is enlarged but stable. Other: No free fluid or free intraperitoneal gas. Stable fat containing left inguinal hernia. No bowel herniation. Musculoskeletal: There are no acute or destructive bony lesions. Postsurgical changes are seen spanning L1 through S1 with multilevel discectomies and posterior fusion. No acute bony abnormalities. Reconstructed images demonstrate no additional findings. IMPRESSION: 1. No urinary tract calculi or obstructive uropathy. 2. Diverticulosis without diverticulitis. 3. Stable fat containing left inguinal hernia. 4. Aortic Atherosclerosis (ICD10-I70.0). Electronically Signed   By: Randa Ngo M.D.   On: 09/24/2019 23:07   XR HIP UNILAT W OR W/O PELVIS 2-3 VIEWS LEFT  Result Date: 09/08/2019 Mild hip osteoarthritis  XR Shoulder Left  Result Date: 09/08/2019 Mild to moderate glenohumeral osteoarthritis.     Domenic Moras, PA-C 09/27/19 1810    Charlesetta Shanks, MD 09/29/19 [phone]

## 2019-09-27 NOTE — ED Triage Notes (Addendum)
C/o lower back pain that started Sunday afternoon after changing the oil in his truck.  States he was negative for kidney stones and followed up with Dr.Elsner for back pain.  Pain worse with movement.  Pain was initially just to L of spine but now radiates to L flank/L side.  Dr. Ellene Route told him to come to ED because imaging of back was normal.  Denies urinary complaints.

## 2019-09-27 NOTE — Discharge Instructions (Signed)
Your back pain is due to degenerative disc disease.  Please call to schedule close follow-up with your neurosurgeon for epidural injection.  Return if your condition worsen or if you have other concern.

## 2019-09-27 NOTE — ED Provider Notes (Signed)
[facility] EMERGENCY DEPARTMENT Provider Note   CSN: [id number] Arrival date & time: 09/27/19  0912     History Chief Complaint  Patient presents with  . Back Pain    [name] is a 79 y.o. male history chronic back pain with multiple surgeries, GERD, hypertension, proximal A. fib, DVT/PE, hyperlipidemia.  Patient presents today for low back pain onset Sunday, 09/24/2019 began shortly after working on his vehicle.  Describes severe left lower back pain constant worsened with movement improved with staying still, nonradiating.  Patient reports he was seen in the ER on 09/25/2019 received pain medication and had a CT scan, he was subsequently discharged.  He contacted his neurosurgeon Dr. Ellene Route who advised him to come to the ER for evaluation after pain continued.  Patient reports pain is minimally improved since he was seen in the ER on Monday.  Denies fall/injury, fever/chills, chest pain/shortness of breath, abdominal pain, nausea/vomiting, diarrhea, dysuria/hematuria, saddle paresthesias, bowel/bladder incontinence, urinary retention or any additional concerns.      HPI     Past Medical History:  Diagnosis Date  . Arthritis   . Cancer ([place])    skin  . Carpal tunnel syndrome   . Cataract    left eye  . Chronic back pain    multiple back surgeries  . Complication of anesthesia    pt states woke up during hemorroid surgery  . Constipation    Metamucil every mornind  . DVT (deep venous thrombosis) ([place]) 03/2007   left leg   . GERD (gastroesophageal reflux disease)    takes Protonix daily  . Glaucoma   . History of colon polyps   . History of hyperglycemia   . History of MRSA infection 1992  . HTN (hypertension)    takes  Ramipril daily  . Hyperlipidemia    takes Pravastatin 3 times a week  . Joint pain   . Joint swelling   . Nocturia   . Paroxysmal atrial fibrillation (HCC)    takes Pradaxa daily, Chads2vasc score 2(07/11/14)  . Pneumonia     many yrs ago  . Pulmonary embolism ([place]) 2008  . Urinary frequency   . Urinary urgency     Patient Active Problem List   Diagnosis Date Noted  . S/P right knee arthroscopy 08/10/2018  . Acute tear lateral meniscus, right, initial encounter 08/03/2018  . Acute medial meniscus tear, right, subsequent encounter 07/13/2018  . Melena 03/28/2018  . Closed fracture of lateral portion of left tibial plateau 03/09/2018  . Aortic atherosclerosis ([place]) 11/05/2016  . History of colonic polyps 06/16/2016  . Pseudoarthrosis of lumbar spine 04/24/2014  . Hx of adenomatous colonic polyps 11/22/2013  . Prediabetes 10/12/2013  . Fatigue 12/21/2012  . Snoring 12/21/2012  . Chronic back pain greater than 3 months duration 10/31/2012  . Sinusitis 02/22/2012  . GERD (gastroesophageal reflux disease) 02/22/2012  . Hyperlipidemia 01/21/2012  . HYPERTENSION, BENIGN 12/06/2009  . ATRIAL FIBRILLATION, PAROXYSMAL 12/06/2009    Past Surgical History:  Procedure Laterality Date  . BIOPSY  04/01/2018   Procedure: BIOPSY;  Surgeon: Rogene Houston, MD;  [facility];  Service: Endoscopy;;  gastric  . CARDIAC CATHETERIZATION  2008  . CARPAL TUNNEL RELEASE Right   . cataract surgery Right   . cervical and lumbar surgery    . COLONOSCOPY  04/24/2011   Procedure: COLONOSCOPY;  Surgeon: Rogene Houston, MD;  [facility];  Service: Endoscopy;  Laterality: N/A;  1200  . COLONOSCOPY N/A 08/13/2016   Procedure: COLONOSCOPY;  Surgeon: Rogene Houston, MD;  [facility];  Service: Endoscopy;  Laterality: N/A;  1200  . ESOPHAGOGASTRODUODENOSCOPY  03/10/2012   Procedure: ESOPHAGOGASTRODUODENOSCOPY (EGD);  Surgeon: Rogene Houston, MD;  [facility];  Service: Endoscopy;  Laterality: N/A;  325  . ESOPHAGOGASTRODUODENOSCOPY (EGD) WITH PROPOFOL N/A 04/01/2018   Procedure: ESOPHAGOGASTRODUODENOSCOPY (EGD) WITH PROPOFOL;  Surgeon: Rogene Houston, MD;  [facility];  Service: Endoscopy;  Laterality: N/A;  . HARDWARE REMOVAL N/A 04/24/2014   Procedure: REMOVAL OF SACRAL INSTRUMENTATION WITH [place];  Surgeon: Kristeen Miss, MD;  Location: [facility];  Service: Neurosurgery;  Laterality: N/A;  . HEMORRHOIDECTOMY WITH HEMORRHOID BANDING    . KNEE ARTHROSCOPY Right   . KNEE ARTHROSCOPY WITH MEDIAL MENISECTOMY Right 08/03/2018   Procedure: RIGHT KNEE ARTHROSCOPY WITH PARTIAL MEDIAL MENISCECTOMY;  Surgeon: Leandrew Koyanagi, MD;  Location: [facility];  Service: Orthopedics;  Laterality: Right;  . KNEE SURGERY     left  . LASIK    . POLYPECTOMY  08/13/2016   Procedure: POLYPECTOMY;  Surgeon: Rogene Houston, MD;  [facility];  Service: Endoscopy;;  colon  . PVI  01/24/10   afib ablation       Family History  Problem Relation Age of Onset  . Hypertension Mother   . Transient ischemic attack Mother   . Heart disease Father   . Diabetes Brother   . Healthy Daughter   . Colon cancer Sister   . Hypertension Other   . Heart disease Other     Social History   Tobacco Use  . Smoking status: Former Smoker    Packs/day: 0.25    Years: 3.00    Pack years: 0.75    Types: Cigarettes    Quit date: 03/30/1969    Years since quitting: 50.5  . Smokeless tobacco: Never Used  Substance Use Topics  . Alcohol use: Yes    Alcohol/week: 14.0 standard drinks    Types: 7 Glasses of wine, 7 Shots of liquor per week    Comment: wine nightly  . Drug use: No    Home Medications Prior to Admission medications   Medication Sig Start Date End Date Taking? Authorizing Provider  acetaminophen (TYLENOL 8 HOUR ARTHRITIS PAIN) 650 MG CR tablet Take 1,300 mg by mouth once as needed for pain.    [provider]  amLODipine (NORVASC) 5 MG tablet Take 1 tablet (5 mg total) by mouth daily. Patient taking differently: Take 10 mg by mouth in the morning.  06/05/19   Kathyrn Drown, MD  apixaban (ELIQUIS) 5 MG TABS tablet Take 1 tablet (5 mg  total) by mouth 2 (two) times daily. 06/05/19   Kathyrn Drown, MD  diltiazem (CARDIZEM) 30 MG tablet Take 1/2 tablet every 4 hours AS NEEDED for afib Patient taking differently: Take 15 mg by mouth See admin instructions. Take 1/2 tablet every 4 hours AS NEEDED for afib 10/11/18   Sherran Needs, NP  diphenhydrAMINE-APAP, sleep, (TYLENOL PM EXTRA STRENGTH PO) Take 1 tablet by mouth at bedtime.     [provider]  indapamide (LOZOL) 2.5 MG tablet Take 1 tablet (2.5 mg total) by mouth daily. 06/05/19   Kathyrn Drown, MD  latanoprost (XALATAN) 0.005 % ophthalmic solution Place 1 drop into both eyes at bedtime.    [provider]  methocarbamol (ROBAXIN) 750 MG tablet Take  1 tablet (750 mg total) by mouth every 6 (six) hours as needed for muscle spasms. 09/25/19   Evalee Jefferson, PA-C  Oxycodone HCl 10 MG TABS One 4 times a day prn pain Patient taking differently: Take 10 mg by mouth 4 (four) times daily as needed (for pain).  08/29/19   Kathyrn Drown, MD  Oxycodone HCl 10 MG TABS Take 1 tablet 4 times daily as needed for pain Patient not taking: Reported on 09/24/2019 08/29/19   Kathyrn Drown, MD  Oxycodone HCl 10 MG TABS Take 1 tablet 4 times daily as needed for pain Patient not taking: Reported on 09/24/2019 08/29/19   Kathyrn Drown, MD  pantoprazole (PROTONIX) 40 MG tablet Take 1 tablet (40 mg total) by mouth daily before breakfast. 08/14/19   Rehman, Mechele Dawley, MD  pravastatin (PRAVACHOL) 80 MG tablet Take 1 tablet (80 mg total) by mouth daily. Patient taking differently: Take 80 mg by mouth at bedtime.  06/05/19   Kathyrn Drown, MD  predniSONE (DELTASONE) 10 MG tablet Take 6 tablets day one, 5 tablets day two, 4 tablets day three, 3 tablets day four, 2 tablets day five, then 1 tablet day six 09/25/19   Idol, Almyra Free, PA-C  ramipril (ALTACE) 10 MG capsule Take 1 capsule (10 mg total) by mouth 2 (two) times daily. 06/05/19   Kathyrn Drown, MD    Allergies    Sectral [acebutolol  hcl], Sulfonamide derivatives, and Zocor [simvastatin]  Review of Systems   Review of Systems Ten systems are reviewed and are negative for acute change except as noted in the HPI  Physical Exam Updated Vital Signs There were no vitals taken for this visit.  Physical Exam Constitutional:      General: He is not in acute distress.    Appearance: Normal appearance. He is well-developed. He is not ill-appearing or diaphoretic.  HENT:     Head: Normocephalic and atraumatic.     Right Ear: External ear normal.     Left Ear: External ear normal.     Nose: Nose normal.  Eyes:     General: Vision grossly intact. Gaze aligned appropriately.     Pupils: Pupils are equal, round, and reactive to light.  Neck:     Trachea: Trachea and phonation normal. No tracheal deviation.  Cardiovascular:     Rate and Rhythm: Normal rate and regular rhythm.     Pulses:          Dorsalis pedis pulses are 2+ on the right side and 2+ on the left side.  Pulmonary:     Effort: Pulmonary effort is normal. No respiratory distress.  Abdominal:     General: There is no distension.     Palpations: Abdomen is soft.     Tenderness: There is no abdominal tenderness. There is no guarding or rebound.  Musculoskeletal:        General: Normal range of motion.     Cervical back: Normal range of motion.     Comments: Well-healed midline surgical scars present. - No midline C/T/L spinal tenderness to palpation, no deformity, crepitus, or step-off noted. No sign of injury to the neck or back.  No rash.   Feet:     Right foot:     Protective Sensation: 5 sites tested. 5 sites sensed.     Left foot:     Protective Sensation: 5 sites tested. 5 sites sensed.  Skin:    General: Skin is warm and dry.  Neurological:     Mental Status: He is alert.     GCS: GCS eye subscore is 4. GCS verbal subscore is 5. GCS motor subscore is 6.     Comments: Speech is clear and goal oriented, follows commands Major Cranial nerves  without deficit, no facial droop Normal strength in upper and lower extremities bilaterally including dorsiflexion and plantar flexion, strong and equal grip strength Sensation normal to light and sharp touch Moves extremities without ataxia, coordination intact  Psychiatric:        Behavior: Behavior normal.     ED Results / Procedures / Treatments   Labs (all labs ordered are listed, but only abnormal results are displayed) Labs Reviewed - No data to display  EKG None  Radiology No results found.  Procedures Procedures (including critical care time)  Medications Ordered in ED Medications - No data to display  ED Course  I have reviewed the triage vital signs and the nursing notes.  Pertinent labs & imaging results that were available during my care of the patient were reviewed by me and considered in my medical decision making (see chart for details).  Clinical Course as of Sep 26 1413  Wed Sep 27, 2019  1305 Dr. Ellene Route   [BM]  1306 MRI Thoracic and Lumbar   [BM]    Clinical Course User Index [BM] Gari Crown   MDM Rules/Calculators/A&P                     Additional History Obtained: Nursing notes from this visit. Prior ED visit on 09/24/2019 reviewed, diagnosis of acute left-sided low back pain without sciatica and muscle spasm of the back.  Lab work including CBC, CMP and urinalysis showed no emergent findings.  CT renal stone study was obtained and showed no emergent pathology specifically no kidney stone.  Diverticulosis, left inguinal hernia and atherosclerosis were noted but did not appear to be related to patient's problem today.  Patient received multiple doses of narcotic pain medication, additionally 10 mg dexamethasone, 15 mg Toradol before being discharged with Robaxin and prednisone.  There was an attempt to admit patient for pain control but this was deferred. - Today patient presents for the same left lower back pain.  He denies any  injury/trauma, no infectious symptoms, additionally no abdominal pain.  On exam he is uncomfortable appearing but improves when staying still.  Pain is positional today, low suspicion for kidney stone, AAA or other emergent intra-abdominal/pelvic etiology of his symptoms today.  Additionally is no neurologic complaints suggestive of cauda equina.  Will obtain CBC, CMP and urinalysis.  Will give Dilaudid for pain control.  Will consult patient's neurosurgeon Dr. Ellene Route after results.  - I ordered, reviewed and interpreted labs which include: CBC within normal limits no leukocytosis to suggest infection no evidence of anemia.  CMP shows potassium 3.3 will supplement p.o. today, no emergent electrolyte derangement, no evidence of AKI or acute elevation of LFTs.  Urinalysis normal limits no evidence of infection and no hemoglobin to suggest infection. - ED Course: Reassuring lab work, patient necessitated additional pain control additional Dilaudid was given.  Consult was called to Dr. Ellene Route at 1:05 PM. [phone])  Dr. Ellene Route asked that we obtain MRI of thoracic and lumbar spine without contrast in the ER today and call him back with results.  Patient and his wife were updated on plan of care they are agreeable.  They have no further questions or additional concerns at  this time.  Case and plan of care discussed with attending physician Dr. Rogene Houston who agrees. - Care handoff given to Domenic Moras, PA-C at shift change.  Plan of care is to await MRI results and call neurosurgery back for recommendations.    Note: Portions of this report may have been transcribed using voice recognition software. Every effort was made to ensure accuracy; however, inadvertent computerized transcription errors may still be present. Final Clinical Impression(s) / ED Diagnoses Final diagnoses:  None    Rx / DC Orders ED Discharge Orders    None       Gari Crown 09/27/19 1512    Fredia Sorrow, MD 09/30/19 [phone]

## 2019-09-27 NOTE — ED Notes (Signed)
Pt reporting increased pain when trying to get up from lying flat position

## 2019-09-27 NOTE — ED Notes (Signed)
Patient verbalizes understanding of discharge instructions. Opportunity for questioning and answers were provided. Armband removed by staff. Patient discharged from ED.  

## 2019-09-28 ENCOUNTER — Other Ambulatory Visit: Payer: Self-pay | Admitting: *Deleted

## 2019-09-28 MED ORDER — AMLODIPINE BESYLATE 10 MG PO TABS: 10.0000 mg | ORAL_TABLET | Freq: Every day | ORAL | 0 refills | Status: DC

## 2019-09-29 ENCOUNTER — Encounter: Payer: PPO | Admitting: Physical Medicine and Rehabilitation

## 2019-10-04 DIAGNOSIS — M25552 Pain in left hip: Secondary | ICD-10-CM | POA: Diagnosis not present

## 2019-10-06 ENCOUNTER — Ambulatory Visit: Payer: PPO | Admitting: Urology

## 2019-10-13 ENCOUNTER — Other Ambulatory Visit: Payer: Self-pay

## 2019-10-13 ENCOUNTER — Encounter: Payer: Self-pay | Admitting: Family Medicine

## 2019-10-13 ENCOUNTER — Ambulatory Visit (INDEPENDENT_AMBULATORY_CARE_PROVIDER_SITE_OTHER): Payer: PPO | Admitting: Family Medicine

## 2019-10-13 VITALS — BP 128/68 | Temp 95.1°F | Wt 184.0 lb

## 2019-10-13 DIAGNOSIS — E876 Hypokalemia: Secondary | ICD-10-CM

## 2019-10-13 DIAGNOSIS — M25552 Pain in left hip: Secondary | ICD-10-CM

## 2019-10-13 NOTE — Progress Notes (Signed)
   Subjective:    Patient ID: [name], male    DOB: [birth year], 79 y.o.   MRN: [id number]  Hypertension This is a chronic problem. Episode onset: 4 week follow up. Pertinent negatives include no chest pain, headaches or shortness of breath. Compliance problems: Amlodipine 10 mg daily     Had to go to er got mri and saw dr elsner doing better now but fearful of return of the pain Often it will start as a sting pain Patient describes a stinging pain in his lower back that is incapacitating when it occurs starts off as a stinging then it builds up it was so severe he had to go to the ER they did an MRI of his thoracic spine and lumbar spine did not find any acute findings although he does have quite a bit of underlying problems with his spine.  Saw his specialist Dr. Ellene Route who did not find any type of additional issues.  Because of all this patient is very frustrated and worried that he could have ongoing trouble with his back He denies overtaking his pain medicine and does have some on hand Pt states he is doing ok on increased dose of Amlodipine. No side effects. Checks blood pressure at home on regular basis.    Review of Systems  Constitutional: Negative for activity change.  HENT: Negative for congestion and rhinorrhea.   Respiratory: Negative for cough and shortness of breath.   Cardiovascular: Negative for chest pain.  Gastrointestinal: Negative for abdominal pain, diarrhea, nausea and vomiting.  Genitourinary: Negative for dysuria and hematuria.  Neurological: Negative for weakness and headaches.  Psychiatric/Behavioral: Negative for behavioral problems and confusion.       Objective:   Physical Exam Vitals reviewed.  Cardiovascular:     Rate and Rhythm: Normal rate and regular rhythm.     Heart sounds: Normal heart sounds. No murmur.  Pulmonary:     Effort: Pulmonary effort is normal.     Breath sounds: Normal breath sounds.  Lymphadenopathy:     Cervical: No cervical  adenopathy.  Neurological:     Mental Status: He is alert.  Psychiatric:        Behavior: Behavior normal.           Assessment & Plan:  Incapacitating back pain that occurs intermittently hard to understand what is triggering this more than likely musculoskeletal patient to continue on his pain medication  Blood pressure very good control continue current measures  Recent hypokalemia check potassium await results  Patient also with intermittent left hip pain around the pelvic rim he is uncertain if this could be triggered by his underlying health problems or a hip problem he would like to see orthopedics.  Referral back to Dr. Erlinda Hong for his input

## 2019-10-14 ENCOUNTER — Encounter: Payer: Self-pay | Admitting: Family Medicine

## 2019-10-14 LAB — BASIC METABOLIC PANEL
BUN/Creatinine Ratio: 24 (ref 10–24)
BUN: 24 mg/dL (ref 8–27)
CO2: 26 mmol/L (ref 20–29)
Calcium: 9.8 mg/dL (ref 8.6–10.2)
Chloride: 101 mmol/L (ref 96–106)
Creatinine, Ser: 1.02 mg/dL (ref 0.76–1.27)
GFR calc Af Amer: 81 mL/min/{1.73_m2} (ref >59)
GFR calc non Af Amer: 70 mL/min/{1.73_m2} (ref >59)
Glucose: 82 mg/dL (ref 65–99)
Potassium: 4.9 mmol/L (ref 3.5–5.2)
Sodium: 141 mmol/L (ref 134–144)

## 2019-10-17 NOTE — Addendum Note (Signed)
Addended by: Dairl Ponder on: 10/17/2019 08:24 AM   Modules accepted: Orders

## 2019-10-27 ENCOUNTER — Encounter: Payer: Self-pay | Admitting: Family Medicine

## 2019-11-01 ENCOUNTER — Ambulatory Visit: Payer: PPO | Admitting: Orthopaedic Surgery

## 2019-11-01 DIAGNOSIS — R109 Unspecified abdominal pain: Secondary | ICD-10-CM | POA: Insufficient documentation

## 2019-11-01 MED ORDER — METAXALONE 800 MG PO TABS
800.0000 mg | ORAL_TABLET | Freq: Three times a day (TID) | ORAL | 0 refills | Status: DC
Start: 2019-11-01 — End: 2019-11-28

## 2019-11-01 MED ORDER — PREDNISONE 10 MG (21) PO TBPK
ORAL_TABLET | ORAL | 0 refills | Status: DC
Start: 2019-11-01 — End: 2019-11-28

## 2019-11-01 NOTE — Progress Notes (Signed)
Office Visit Note   Patient: [name]           Date of Birth: 27-Jan-1941           MRN: [id number] Visit Date: 11/01/2019              Requested by: Kathyrn Drown, MD [address] [place],  [place] [address] PCP: Kathyrn Drown, MD   Assessment & Plan: Visit Diagnoses:  1. Left flank pain     Plan: Impression is left flank pain.  I do not feel that this is related to his hip but he could be is back but I have recommended that he see Dr. Ellene Route again to see if it could be is back.  I did give him prednisone Dosepak as well as Skelaxin to try.  Fortunately the symptoms are improving overall.  Follow-Up Instructions: Return if symptoms worsen or fail to improve.   Orders:  No orders of the defined types were placed in this encounter.  Meds ordered this encounter  Medications  . predniSONE (STERAPRED UNI-PAK 21 TAB) 10 MG (21) TBPK tablet    Sig: Take as directed    Dispense:  21 tablet    Refill:  0  . metaxalone (SKELAXIN) 800 MG tablet    Sig: Take 1 tablet (800 mg total) by mouth 3 (three) times daily.    Dispense:  20 tablet    Refill:  0      Procedures: No procedures performed   Clinical Data: No additional findings.   Subjective: Chief Complaint  Patient presents with  . Left Hip - Pain    [name] is a 79 year old gentleman who comes in to be evaluated for left flank pain.  He states that complete weeks ago he had severe pain.  He underwent a CT and MRI and was evaluated by Dr. Ellene Route and was told by Dr. Ellene Route that it was not coming from his back.  He states that he has no groin pain or lateral hip pain.  All the pain is on the left side of his torso just above the iliac wing.  He endorses back pain that radiates into this area.  Denies any urinary symptoms.   Review of Systems  Constitutional: Negative.   All other systems reviewed and are negative.    Objective: Vital Signs: There were no vitals taken for this visit.  Physical  Exam Vitals and nursing note reviewed.  Constitutional:      Appearance: He is well-developed.  Pulmonary:     Effort: Pulmonary effort is normal.  Abdominal:     Palpations: Abdomen is soft.  Skin:    General: Skin is warm.  Neurological:     Mental Status: He is alert and oriented to person, place, and time.  Psychiatric:        Behavior: Behavior normal.        Thought Content: Thought content normal.        Judgment: Judgment normal.     Ortho Exam Left hip exam is unremarkable. He is tender to the left flank above the iliac wing.  He has some slight tenderness to the lumbar spine. Specialty Comments:  No specialty comments available.  Imaging: No results found.   PMFS History: Patient Active Problem List   Diagnosis Date Noted  . Left flank pain 11/01/2019  . S/P right knee arthroscopy 08/10/2018  . Acute tear lateral meniscus, right, initial encounter 08/03/2018  . Acute medial  meniscus tear, right, subsequent encounter 07/13/2018  . Melena 03/28/2018  . Closed fracture of lateral portion of left tibial plateau 03/09/2018  . Aortic atherosclerosis ([place]) 11/05/2016  . History of colonic polyps 06/16/2016  . Pseudoarthrosis of lumbar spine 04/24/2014  . Hx of adenomatous colonic polyps 11/22/2013  . Prediabetes 10/12/2013  . Fatigue 12/21/2012  . Snoring 12/21/2012  . Chronic back pain greater than 3 months duration 10/31/2012  . Sinusitis 02/22/2012  . GERD (gastroesophageal reflux disease) 02/22/2012  . Hyperlipidemia 01/21/2012  . HYPERTENSION, BENIGN 12/06/2009  . ATRIAL FIBRILLATION, PAROXYSMAL 12/06/2009   Past Medical History:  Diagnosis Date  . Arthritis   . Cancer ([place])    skin  . Carpal tunnel syndrome   . Cataract    left eye  . Chronic back pain    multiple back surgeries  . Complication of anesthesia    pt states woke up during hemorroid surgery  . Constipation    Metamucil every mornind  . DVT (deep venous thrombosis) ([place]) 03/2007     left leg   . GERD (gastroesophageal reflux disease)    takes Protonix daily  . Glaucoma   . History of colon polyps   . History of hyperglycemia   . History of MRSA infection 1992  . HTN (hypertension)    takes  Ramipril daily  . Hyperlipidemia    takes Pravastatin 3 times a week  . Joint pain   . Joint swelling   . Nocturia   . Paroxysmal atrial fibrillation (HCC)    takes Pradaxa daily, Chads2vasc score 2(07/11/14)  . Pneumonia    many yrs ago  . Pulmonary embolism ([place]) 2008  . Urinary frequency   . Urinary urgency     Family History  Problem Relation Age of Onset  . Hypertension Mother   . Transient ischemic attack Mother   . Heart disease Father   . Diabetes Brother   . Healthy Daughter   . Colon cancer Sister   . Hypertension Other   . Heart disease Other     Past Surgical History:  Procedure Laterality Date  . BIOPSY  04/01/2018   Procedure: BIOPSY;  Surgeon: Rogene Houston, MD;  [facility];  Service: Endoscopy;;  gastric  . CARDIAC CATHETERIZATION  2008  . CARPAL TUNNEL RELEASE Right   . cataract surgery Right   . cervical and lumbar surgery    . COLONOSCOPY  04/24/2011   Procedure: COLONOSCOPY;  Surgeon: Rogene Houston, MD;  [facility];  Service: Endoscopy;  Laterality: N/A;  1200  . COLONOSCOPY N/A 08/13/2016   Procedure: COLONOSCOPY;  Surgeon: Rogene Houston, MD;  [facility];  Service: Endoscopy;  Laterality: N/A;  1200  . ESOPHAGOGASTRODUODENOSCOPY  03/10/2012   Procedure: ESOPHAGOGASTRODUODENOSCOPY (EGD);  Surgeon: Rogene Houston, MD;  [facility];  Service: Endoscopy;  Laterality: N/A;  325  . ESOPHAGOGASTRODUODENOSCOPY (EGD) WITH PROPOFOL N/A 04/01/2018   Procedure: ESOPHAGOGASTRODUODENOSCOPY (EGD) WITH PROPOFOL;  Surgeon: Rogene Houston, MD;  [facility];  Service: Endoscopy;  Laterality: N/A;  . HARDWARE REMOVAL N/A 04/24/2014   Procedure: REMOVAL OF SACRAL INSTRUMENTATION WITH  [place];  Surgeon: Kristeen Miss, MD;  Location: [facility];  Service: Neurosurgery;  Laterality: N/A;  . HEMORRHOIDECTOMY WITH HEMORRHOID BANDING    . KNEE ARTHROSCOPY Right   . KNEE ARTHROSCOPY WITH MEDIAL MENISECTOMY Right 08/03/2018   Procedure: RIGHT KNEE ARTHROSCOPY WITH PARTIAL MEDIAL MENISCECTOMY;  Surgeon: Frankey Shown  M, MD;  Location: [facility];  Service: Orthopedics;  Laterality: Right;  . KNEE SURGERY     left  . LASIK    . POLYPECTOMY  08/13/2016   Procedure: POLYPECTOMY;  Surgeon: Rogene Houston, MD;  [facility];  Service: Endoscopy;;  colon  . PVI  01/24/10   afib ablation   Social History   Occupational History  . Occupation: retired  Tobacco Use  . Smoking status: Former Smoker    Packs/day: 0.25    Years: 3.00    Pack years: 0.75    Types: Cigarettes    Quit date: 03/30/1969    Years since quitting: 50.6  . Smokeless tobacco: Never Used  Vaping Use  . Vaping Use: Never used  Substance and Sexual Activity  . Alcohol use: Yes    Alcohol/week: 14.0 standard drinks    Types: 7 Glasses of wine, 7 Shots of liquor per week    Comment: wine nightly  . Drug use: No  . Sexual activity: Yes

## 2019-11-17 DIAGNOSIS — H2512 Age-related nuclear cataract, left eye: Secondary | ICD-10-CM | POA: Diagnosis not present

## 2019-11-17 DIAGNOSIS — H401132 Primary open-angle glaucoma, bilateral, moderate stage: Secondary | ICD-10-CM | POA: Diagnosis not present

## 2019-11-17 DIAGNOSIS — H16223 Keratoconjunctivitis sicca, not specified as Sjogren's, bilateral: Secondary | ICD-10-CM | POA: Diagnosis not present

## 2019-11-28 ENCOUNTER — Other Ambulatory Visit: Payer: Self-pay | Admitting: Family Medicine

## 2019-11-28 ENCOUNTER — Other Ambulatory Visit: Payer: Self-pay

## 2019-11-28 ENCOUNTER — Ambulatory Visit (INDEPENDENT_AMBULATORY_CARE_PROVIDER_SITE_OTHER): Payer: PPO | Admitting: Family Medicine

## 2019-11-28 ENCOUNTER — Ambulatory Visit: Payer: PPO | Admitting: Family Medicine

## 2019-11-28 VITALS — BP 136/84 | Temp 98.2°F | Ht 66.0 in | Wt 184.2 lb

## 2019-11-28 DIAGNOSIS — I10 Essential (primary) hypertension: Secondary | ICD-10-CM

## 2019-11-28 DIAGNOSIS — Z79891 Long term (current) use of opiate analgesic: Secondary | ICD-10-CM

## 2019-11-28 DIAGNOSIS — I480 Paroxysmal atrial fibrillation: Secondary | ICD-10-CM | POA: Diagnosis not present

## 2019-11-28 DIAGNOSIS — E7849 Other hyperlipidemia: Secondary | ICD-10-CM | POA: Diagnosis not present

## 2019-11-28 DIAGNOSIS — M25552 Pain in left hip: Secondary | ICD-10-CM

## 2019-11-28 MED ORDER — OXYCODONE HCL 10 MG PO TABS: ORAL_TABLET | ORAL | 0 refills | Status: DC

## 2019-11-28 MED ORDER — PRAVASTATIN SODIUM 80 MG PO TABS: 80.0000 mg | ORAL_TABLET | Freq: Every day | ORAL | 1 refills | Status: DC

## 2019-11-28 MED ORDER — APIXABAN 5 MG PO TABS: 5.0000 mg | ORAL_TABLET | Freq: Two times a day (BID) | ORAL | 5 refills | Status: DC

## 2019-11-28 MED ORDER — AMLODIPINE BESYLATE 10 MG PO TABS: 10.0000 mg | ORAL_TABLET | Freq: Every day | ORAL | 0 refills | Status: DC

## 2019-11-28 MED ORDER — INDAPAMIDE 2.5 MG PO TABS: 2.5000 mg | ORAL_TABLET | Freq: Every day | ORAL | 1 refills | Status: DC

## 2019-11-28 MED ORDER — RAMIPRIL 10 MG PO CAPS: 10.0000 mg | ORAL_CAPSULE | Freq: Two times a day (BID) | ORAL | 1 refills | Status: DC

## 2019-11-28 NOTE — Progress Notes (Signed)
Subjective:    Patient ID: [name], male    DOB: Nov 16, 1940, 79 y.o.   MRN: [id number]  HPI  This patient was seen today for chronic pain  The medication list was reviewed and updated.   -Compliance with medication: Patient relates compliance  - Number patient states they take daily: Takes 4 a day sometimes 3  -when was the last dose patient took?  Yesterday  The patient was advised the importance of maintaining medication and not using illegal substances with these.  Here for refills and follow up  The patient was educated that we can provide 3 monthly scripts for their medication, it is their responsibility to follow the instructions.  Side effects or complications from medications: Denies any side effects with the medicine denies it causing drowsiness  Patient is aware that pain medications are meant to minimize the severity of the pain to allow their pain levels to improve to allow for better function. They are aware of that pain medications cannot totally remove their pain.  Due for UDT ( at least once per year) : Today  Scale of 1 to 10 ( 1 is least 10 is most) Your pain level without the medicine: 8 Your pain level with medication 3  Scale 1 to 10 ( 1-helps very little, 10 helps very well) How well does your pain medication reduce your pain so you can function better through out the day?  9  Patient for blood pressure check up.  The patient does have hypertension.  The patient is on medication.  Patient relates compliance with meds. Todays BP reviewed with the patient. Patient denies issues with medication. Patient relates reasonable diet. Patient tries to minimize salt. Patient aware of BP goals.   Encounter for long-term use of opiate analgesic - Plan: ToxASSURE Select 13 (MW), Urine  HYPERTENSION, BENIGN - Plan: Basic metabolic panel  Other hyperlipidemia - Plan: Lipid panel  Paroxysmal atrial fibrillation (HCC)  Left hip pain - Plan: DG HIP UNILAT WITH  PELVIS MIN 4 VIEWS LEFT     Review of Systems  Constitutional: Negative for diaphoresis and fatigue.  HENT: Negative for congestion and rhinorrhea.   Respiratory: Negative for cough and shortness of breath.   Cardiovascular: Negative for chest pain and leg swelling.  Gastrointestinal: Negative for abdominal pain and diarrhea.  Musculoskeletal: Positive for arthralgias and back pain.  Skin: Negative for color change and rash.  Neurological: Negative for dizziness and headaches.  Psychiatric/Behavioral: Negative for behavioral problems and confusion.       Objective:   Physical Exam Vitals reviewed.  Constitutional:      General: He is not in acute distress. HENT:     Head: Normocephalic and atraumatic.  Eyes:     General:        Right eye: No discharge.        Left eye: No discharge.  Neck:     Trachea: No tracheal deviation.  Cardiovascular:     Rate and Rhythm: Normal rate and regular rhythm.     Heart sounds: Normal heart sounds. No murmur heard.   Pulmonary:     Effort: Pulmonary effort is normal. No respiratory distress.     Breath sounds: Normal breath sounds.  Lymphadenopathy:     Cervical: No cervical adenopathy.  Skin:    General: Skin is warm and dry.  Neurological:     Mental Status: He is alert.     Coordination: Coordination normal.  Psychiatric:  Behavior: Behavior normal.    The patient was seen in followup for chronic pain. A review over at their current pain status was discussed. Drug registry was checked. Prescriptions were given.  Regular follow-up recommended. Discussion was held regarding the importance of compliance with medication as well as pain medication contract.  Patient was informed that medication may cause drowsiness and should not be combined  with other medications/alcohol or street drugs. If the patient feels medication is causing altered alertness then do not drive or operate dangerous equipment.  Patient does do well  with his medication       Assessment & Plan:  1. Encounter for long-term use of opiate analgesic Urine drug screen today The patient was seen in followup for chronic pain. A review over at their current pain status was discussed. Drug registry was checked. Prescriptions were given.  Regular follow-up recommended. Discussion was held regarding the importance of compliance with medication as well as pain medication contract.  Patient was informed that medication may cause drowsiness and should not be combined  with other medications/alcohol or street drugs. If the patient feels medication is causing altered alertness then do not drive or operate dangerous equipment.  Patient denies abusing his medication - ToxASSURE Select 13 (MW), Urine  2. HYPERTENSION, BENIGN Blood pressure good control continue current measures - Basic metabolic panel  3. Other hyperlipidemia Continue medication watch diet check lipid - Lipid panel  4. Paroxysmal atrial fibrillation (HCC) Heart rate controlled continue current measures  5. Left hip pain Complains of left hip pain x-ray ordered await the results - DG HIP UNILAT WITH PELVIS MIN 4 VIEWS LEFT

## 2019-12-01 LAB — TOXASSURE SELECT 13 (MW), URINE

## 2019-12-03 ENCOUNTER — Encounter: Payer: Self-pay | Admitting: Family Medicine

## 2019-12-04 ENCOUNTER — Telehealth: Payer: Self-pay | Admitting: *Deleted

## 2019-12-04 ENCOUNTER — Ambulatory Visit (HOSPITAL_COMMUNITY)
Admission: RE | Admit: 2019-12-04 | Discharge: 2019-12-04 | Disposition: A | Discharge status: Home / Self Care | Payer: PPO | Source: Ambulatory Visit | Attending: Family Medicine | Admitting: Family Medicine

## 2019-12-04 ENCOUNTER — Other Ambulatory Visit: Payer: Self-pay

## 2019-12-04 ENCOUNTER — Other Ambulatory Visit: Payer: Self-pay | Admitting: *Deleted

## 2019-12-04 DIAGNOSIS — M545 Low back pain, unspecified: Secondary | ICD-10-CM

## 2019-12-04 DIAGNOSIS — M25552 Pain in left hip: Secondary | ICD-10-CM | POA: Insufficient documentation

## 2019-12-04 DIAGNOSIS — M1612 Unilateral primary osteoarthritis, left hip: Secondary | ICD-10-CM | POA: Diagnosis not present

## 2019-12-04 NOTE — Telephone Encounter (Signed)
Jennifer from [facility] called to report pt is there now for left hip xray and states he does not want his hip xray. He would like his lower back xray. Consult with dr Nicki Reaper and he states pt may have order for lumbar spine xray but still needs a basic left hip xray. Discussed with Anderson Malta at radiology and order for lumbar spine put in.

## 2019-12-12 ENCOUNTER — Ambulatory Visit (INDEPENDENT_AMBULATORY_CARE_PROVIDER_SITE_OTHER): Payer: PPO | Admitting: Internal Medicine

## 2019-12-12 ENCOUNTER — Telehealth: Payer: Self-pay | Admitting: Family Medicine

## 2019-12-12 NOTE — Telephone Encounter (Signed)
[facility] Oxycodone 10mg  is on a Psychologist, prison and probation services. Do you have any other options for this pt?

## 2019-12-12 NOTE — Telephone Encounter (Signed)
Pt's pharmacy doesn't have his pain medication. He is wanting to know what other option he has.   Oxycodone HCl 10 MG TABS

## 2019-12-12 NOTE — Telephone Encounter (Signed)
Please inquire with the pharmacies the following #1-his oxycodone 10 mg / 325 mg-otherwise known his Percocet--available? #2 if that is not available is oxycodone 7.5 mg / 325 mg available? If that is not available is oxycodone 5 mg / 325 mg available?

## 2019-12-13 ENCOUNTER — Other Ambulatory Visit: Payer: Self-pay | Admitting: Family Medicine

## 2019-12-13 MED ORDER — OXYCODONE-ACETAMINOPHEN 10-325 MG PO TABS: 1.0000 | ORAL_TABLET | Freq: Four times a day (QID) | ORAL | 0 refills | Status: DC | PRN

## 2019-12-13 NOTE — Telephone Encounter (Signed)
[facility] 10 [facility] / 325 mg is available for the moment but not sure how long it will last.

## 2019-12-13 NOTE — Telephone Encounter (Signed)
Patient notified and verbalized understanding. 

## 2019-12-13 NOTE — Telephone Encounter (Signed)
[facility] for 120 tablets of the 10 mg/325 oxycodones-please notify patient

## 2019-12-15 ENCOUNTER — Ambulatory Visit: Payer: PPO | Admitting: Urology

## 2020-01-09 ENCOUNTER — Other Ambulatory Visit: Payer: Self-pay | Admitting: Family Medicine

## 2020-01-09 ENCOUNTER — Telehealth: Payer: Self-pay | Admitting: Family Medicine

## 2020-01-09 MED ORDER — OXYCODONE-ACETAMINOPHEN 10-325 MG PO TABS: 1.0000 | ORAL_TABLET | Freq: Four times a day (QID) | ORAL | 0 refills | Status: AC | PRN

## 2020-01-09 MED ORDER — OXYCODONE-ACETAMINOPHEN 10-325 MG PO TABS: ORAL_TABLET | ORAL | 0 refills | Status: DC

## 2020-01-09 NOTE — Telephone Encounter (Signed)
2 rx percocet sent

## 2020-01-09 NOTE — Telephone Encounter (Signed)
Plain Oxycodone 10mg  is still not available per pharmacy- patient needs a script of the Percocet sent to [facility]

## 2020-01-09 NOTE — Telephone Encounter (Signed)
Patient notified

## 2020-01-09 NOTE — Telephone Encounter (Signed)
Pt needs refills on oxyCODONE-acetaminophen (PERCOCET) 10-325 MG tablet 2 rx for it  [id number] [place] [place] Pt has dr appt in October 20th for med follow up[   Pt call back [phone]

## 2020-01-26 ENCOUNTER — Telehealth: Payer: Self-pay | Admitting: *Deleted

## 2020-01-26 NOTE — Telephone Encounter (Signed)
Left message to return call 

## 2020-01-26 NOTE — Telephone Encounter (Signed)
He may have x-ray of the forearm Make sure that foreign body is the diagnosis to utilize Also on the note to the radiology people-please note what happened to the patient in the reason we are doing the x-rays to make sure we do not see underlying metal foreign body

## 2020-01-26 NOTE — Telephone Encounter (Signed)
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 He is having pain and discomfort when moving his arm. I notified him that Dr. Nicki Reaper didn't think an x ray was completely necessary as long as he went and had a Tetanus shot done. He is going to the pharmacy today for the Tetanus. He wants Dr. Nicki Reaper to know he feels that it hit the bone and that it may cause some problems --wants to know if you think it should get an x ray done. Please advise.

## 2020-01-29 DIAGNOSIS — H2512 Age-related nuclear cataract, left eye: Secondary | ICD-10-CM | POA: Diagnosis not present

## 2020-01-29 DIAGNOSIS — H401132 Primary open-angle glaucoma, bilateral, moderate stage: Secondary | ICD-10-CM | POA: Diagnosis not present

## 2020-01-29 DIAGNOSIS — H16223 Keratoconjunctivitis sicca, not specified as Sjogren's, bilateral: Secondary | ICD-10-CM | POA: Diagnosis not present

## 2020-01-30 NOTE — Telephone Encounter (Signed)
Discussed with pt. Pt states it was really bothering him Sunday but better now. And wants to know if he can wait til Thursday and see if it is still bothering him. but will do what ever dr Nicki Reaper wants him to do. Still bothering him a little bit when he turns it a certain way. He was concerned about if he had to have a MRI and if he had a piece of metal in his arm but states he does not feel that spot anymore that he felt when it happened. Offered appt to check arm and pt declined for now and wanted to ask dr scott what he thought.

## 2020-02-01 ENCOUNTER — Other Ambulatory Visit: Payer: Self-pay | Admitting: *Deleted

## 2020-02-01 DIAGNOSIS — S50852A Superficial foreign body of left forearm, initial encounter: Secondary | ICD-10-CM

## 2020-02-01 NOTE — Telephone Encounter (Signed)
Patient decided to go ahead and have the x ray done. It was ordered and he will go have done sometime in the next week.

## 2020-02-01 NOTE — Telephone Encounter (Signed)
So if patient does not want to do x-ray that is okay but he should try to remember that should he ever have an MRI he would need to get this x-ray completed beforehand. The patient may decide to go ahead and get this if he feels he may forget about this issue in the future

## 2020-02-05 ENCOUNTER — Ambulatory Visit (HOSPITAL_COMMUNITY)
Admission: RE | Admit: 2020-02-05 | Discharge: 2020-02-05 | Disposition: A | Discharge status: Home / Self Care | Payer: PPO | Source: Ambulatory Visit | Attending: Family Medicine | Admitting: Family Medicine

## 2020-02-05 ENCOUNTER — Other Ambulatory Visit: Payer: Self-pay

## 2020-02-05 DIAGNOSIS — S50852A Superficial foreign body of left forearm, initial encounter: Secondary | ICD-10-CM | POA: Insufficient documentation

## 2020-02-05 DIAGNOSIS — S5012XA Contusion of left forearm, initial encounter: Secondary | ICD-10-CM | POA: Diagnosis not present

## 2020-02-06 ENCOUNTER — Other Ambulatory Visit: Payer: Self-pay | Admitting: *Deleted

## 2020-02-06 DIAGNOSIS — S50852A Superficial foreign body of left forearm, initial encounter: Secondary | ICD-10-CM

## 2020-02-15 ENCOUNTER — Encounter: Payer: Self-pay | Admitting: Family Medicine

## 2020-02-29 DIAGNOSIS — E7849 Other hyperlipidemia: Secondary | ICD-10-CM | POA: Diagnosis not present

## 2020-02-29 DIAGNOSIS — I10 Essential (primary) hypertension: Secondary | ICD-10-CM | POA: Diagnosis not present

## 2020-03-01 LAB — BASIC METABOLIC PANEL WITH GFR
BUN/Creatinine Ratio: 18 (ref 10–24)
BUN: 16 mg/dL (ref 8–27)
CO2: 25 mmol/L (ref 20–29)
Calcium: 9.4 mg/dL (ref 8.6–10.2)
Chloride: 102 mmol/L (ref 96–106)
Creatinine, Ser: 0.88 mg/dL (ref 0.76–1.27)
GFR calc Af Amer: 94 mL/min/1.73 (ref >59)
GFR calc non Af Amer: 82 mL/min/1.73 (ref >59)
Glucose: 95 mg/dL (ref 65–99)
Potassium: 4.3 mmol/L (ref 3.5–5.2)
Sodium: 140 mmol/L (ref 134–144)

## 2020-03-01 LAB — LIPID PANEL
Chol/HDL Ratio: 3.4 ratio (ref 0.0–5.0)
Cholesterol, Total: 162 mg/dL (ref 100–199)
HDL: 47 mg/dL (ref >39)
LDL Chol Calc (NIH): 89 mg/dL (ref 0–99)
Triglycerides: 147 mg/dL (ref 0–149)
VLDL Cholesterol Cal: 26 mg/dL (ref 5–40)

## 2020-03-06 ENCOUNTER — Ambulatory Visit: Payer: PPO | Admitting: Family Medicine

## 2020-03-07 ENCOUNTER — Encounter: Payer: Self-pay | Admitting: Family Medicine

## 2020-03-07 ENCOUNTER — Ambulatory Visit (INDEPENDENT_AMBULATORY_CARE_PROVIDER_SITE_OTHER): Payer: PPO | Admitting: Family Medicine

## 2020-03-07 ENCOUNTER — Other Ambulatory Visit: Payer: Self-pay

## 2020-03-07 VITALS — BP 132/68 | HR 77 | Temp 97.8°F | Ht 66.0 in | Wt 188.0 lb

## 2020-03-07 DIAGNOSIS — I700 Atherosclerosis of aorta: Secondary | ICD-10-CM

## 2020-03-07 DIAGNOSIS — Z79891 Long term (current) use of opiate analgesic: Secondary | ICD-10-CM

## 2020-03-07 DIAGNOSIS — I10 Essential (primary) hypertension: Secondary | ICD-10-CM | POA: Diagnosis not present

## 2020-03-07 DIAGNOSIS — Z23 Encounter for immunization: Secondary | ICD-10-CM

## 2020-03-07 DIAGNOSIS — E7849 Other hyperlipidemia: Secondary | ICD-10-CM

## 2020-03-07 DIAGNOSIS — I480 Paroxysmal atrial fibrillation: Secondary | ICD-10-CM

## 2020-03-07 DIAGNOSIS — M545 Low back pain, unspecified: Secondary | ICD-10-CM

## 2020-03-07 MED ORDER — OXYCODONE HCL 10 MG PO TABS: ORAL_TABLET | ORAL | 0 refills | Status: DC

## 2020-03-07 NOTE — Progress Notes (Signed)
Subjective:    Patient ID: [name], male    DOB: December 02, 1940, 79 y.o.   MRN: [id number]  HPI This patient was seen today for chronic pain  The medication list was reviewed and updated.   -Compliance with medication: takes 4 a day  - Number patient states they take daily: 4 a day  -when was the last dose patient took? This morning  The patient was advised the importance of maintaining medication and not using illegal substances with these.  Here for refills and follow up  The patient was educated that we can provide 3 monthly scripts for their medication, it is their responsibility to follow the instructions.  Side effects or complications from medications: none   Patient is aware that pain medications are meant to minimize the severity of the pain to allow their pain levels to improve to allow for better function. They are aware of that pain medications cannot totally remove their pain.  Due for UDT ( at least once per year) : last one 11/28/19  Scale of 1 to 10 ( 1 is least 10 is most) Your pain level without the medicine: 9 or 10 Your pain level with medication: 4 or 5   Scale 1 to 10 ( 1-helps very little, 10 helps very well) How well does your pain medication reduce your pain so you can function better through out the day? 6  Hears a noise in right ear and he was told it was from having high blood pressure.  More than likely this is related to the tinnitus.  Has steel in left arm and having some pain when he turns arm.  This does cause him intermittent pain discomfort.   Need for vaccination - Plan: Flu Vaccine QUAD High Dose(Fluad)  Aortic atherosclerosis (HCC)  Lumbar pain  Encounter for long-term use of opiate analgesic  HYPERTENSION, BENIGN  Other hyperlipidemia  Paroxysmal atrial fibrillation ([place])  Patient states his heart is been doing well no flareups of atrial fibrillation recently He does take his cholesterol medicine tries to stay physically  active Does do some stretches. Keeps blood pressure under control by taking medication watching diet.  Review of Systems  Constitutional: Negative for activity change, appetite change and fatigue.  HENT: Negative for congestion and rhinorrhea.   Respiratory: Negative for cough and shortness of breath.   Cardiovascular: Negative for chest pain and leg swelling.  Gastrointestinal: Negative for abdominal pain, nausea and vomiting.  Musculoskeletal: Positive for arthralgias and back pain.  Neurological: Negative for dizziness and headaches.  Psychiatric/Behavioral: Negative for agitation and behavioral problems.       Objective:   Physical Exam Vitals reviewed.  Cardiovascular:     Rate and Rhythm: Normal rate and regular rhythm.     Heart sounds: Normal heart sounds. No murmur heard.   Pulmonary:     Effort: Pulmonary effort is normal.     Breath sounds: Normal breath sounds.  Lymphadenopathy:     Cervical: No cervical adenopathy.  Neurological:     Mental Status: He is alert.  Psychiatric:        Behavior: Behavior normal.           Assessment & Plan:  1. Need for vaccination Flu vaccine today - Flu Vaccine QUAD High Dose(Fluad)  2. Aortic atherosclerosis ([place]) Best way to keep this under control is minimize cholesterol keep blood pressure under control  3. Lumbar pain Stretching exercises.  May continue use pain medication.  4. Encounter  for long-term use of opiate analgesic The patient was seen in followup for chronic pain. A review over at their current pain status was discussed. Drug registry was checked. Prescriptions were given.  Regular follow-up recommended. Discussion was held regarding the importance of compliance with medication as well as pain medication contract.  Patient was informed that medication may cause drowsiness and should not be combined  with other medications/alcohol or street drugs. If the patient feels medication is causing altered  alertness then do not drive or operate dangerous equipment.    5. HYPERTENSION, BENIGN Blood pressure good control continue current measures  6. Other hyperlipidemia Previous labs look good continue cholesterol medicine  7. Paroxysmal atrial fibrillation (HCC) No evidence that it is in currently but he is on blood thinner to prevent stroke Patient will need to see orthopedics for the foreign body in his left arm.

## 2020-03-07 NOTE — Patient Instructions (Signed)
  Results for orders placed or performed in visit on 11/28/19  ToxASSURE Select 13 (MW), Urine  Result Value Ref Range   Summary Note   Lipid panel  Result Value Ref Range   Cholesterol, Total 162 100 - 199 mg/dL   Triglycerides 147 0 - 149 mg/dL   HDL 47 >39 mg/dL   VLDL Cholesterol Cal 26 5 - 40 mg/dL   LDL Chol Calc (NIH) 89 0 - 99 mg/dL   Chol/HDL Ratio 3.4 0.0 - 5.0 ratio  Basic metabolic panel  Result Value Ref Range   Glucose 95 65 - 99 mg/dL   BUN 16 8 - 27 mg/dL   Creatinine, Ser 0.88 0.76 - 1.27 mg/dL   GFR calc non Af Amer 82 >59 mL/min/1.73   GFR calc Af Amer 94 >59 mL/min/1.73   BUN/Creatinine Ratio 18 10 - 24   Sodium 140 134 - 144 mmol/L   Potassium 4.3 3.5 - 5.2 mmol/L   Chloride 102 96 - 106 mmol/L   CO2 25 20 - 29 mmol/L   Calcium 9.4 8.6 - 10.2 mg/dL

## 2020-03-13 NOTE — Progress Notes (Signed)
03/13/20- Contacted [facility]. Pt has appt on 03/26/20 at 3:15 pm. Dr.Ramos does do nerve conduction studies as long as it is upper extremities.

## 2020-03-23 ENCOUNTER — Other Ambulatory Visit: Payer: Self-pay | Admitting: Family Medicine

## 2020-03-25 ENCOUNTER — Other Ambulatory Visit: Payer: Self-pay | Admitting: *Deleted

## 2020-03-25 ENCOUNTER — Telehealth: Payer: Self-pay

## 2020-03-25 DIAGNOSIS — K219 Gastro-esophageal reflux disease without esophagitis: Secondary | ICD-10-CM

## 2020-03-25 MED ORDER — PANTOPRAZOLE SODIUM 40 MG PO TBEC: 40.0000 mg | DELAYED_RELEASE_TABLET | Freq: Every day | ORAL | 1 refills | Status: DC

## 2020-03-25 MED ORDER — AMLODIPINE BESYLATE 10 MG PO TABS: 10.0000 mg | ORAL_TABLET | Freq: Every day | ORAL | 1 refills | Status: DC

## 2020-03-25 NOTE — Telephone Encounter (Signed)
Patient is requesting refill on pantoprazole 40 mg and amlodipne [facility] mg [facility] [place]

## 2020-03-25 NOTE — Telephone Encounter (Signed)
Last seen 03/07/20. I sent in 6 months of amlodipine per protocol but protonix was last sent in by GI. Do you want to fill or should pt call gi

## 2020-03-25 NOTE — Telephone Encounter (Signed)
Pantoprazole may have 90 day and 1 refill

## 2020-03-25 NOTE — Telephone Encounter (Signed)
Rx sent 

## 2020-03-29 DIAGNOSIS — G5602 Carpal tunnel syndrome, left upper limb: Secondary | ICD-10-CM | POA: Diagnosis not present

## 2020-03-29 DIAGNOSIS — I10 Essential (primary) hypertension: Secondary | ICD-10-CM | POA: Diagnosis not present

## 2020-04-01 ENCOUNTER — Other Ambulatory Visit: Payer: Self-pay | Admitting: Family Medicine

## 2020-04-04 DIAGNOSIS — L570 Actinic keratosis: Secondary | ICD-10-CM | POA: Diagnosis not present

## 2020-04-04 DIAGNOSIS — C44519 Basal cell carcinoma of skin of other part of trunk: Secondary | ICD-10-CM | POA: Diagnosis not present

## 2020-04-04 DIAGNOSIS — D225 Melanocytic nevi of trunk: Secondary | ICD-10-CM | POA: Diagnosis not present

## 2020-04-04 DIAGNOSIS — Z1283 Encounter for screening for malignant neoplasm of skin: Secondary | ICD-10-CM | POA: Diagnosis not present

## 2020-04-04 DIAGNOSIS — X32XXXD Exposure to sunlight, subsequent encounter: Secondary | ICD-10-CM | POA: Diagnosis not present

## 2020-04-16 ENCOUNTER — Ambulatory Visit (INDEPENDENT_AMBULATORY_CARE_PROVIDER_SITE_OTHER): Payer: PPO | Admitting: Internal Medicine

## 2020-04-18 ENCOUNTER — Ambulatory Visit (INDEPENDENT_AMBULATORY_CARE_PROVIDER_SITE_OTHER): Payer: PPO | Admitting: Gastroenterology

## 2020-04-29 ENCOUNTER — Ambulatory Visit (INDEPENDENT_AMBULATORY_CARE_PROVIDER_SITE_OTHER): Payer: PPO | Admitting: Gastroenterology

## 2020-04-29 ENCOUNTER — Other Ambulatory Visit: Payer: Self-pay

## 2020-04-29 ENCOUNTER — Encounter (INDEPENDENT_AMBULATORY_CARE_PROVIDER_SITE_OTHER): Payer: Self-pay | Admitting: Gastroenterology

## 2020-04-29 VITALS — BP 159/76 | HR 71 | Temp 98.1°F | Ht 66.0 in | Wt 191.0 lb

## 2020-04-29 DIAGNOSIS — K219 Gastro-esophageal reflux disease without esophagitis: Secondary | ICD-10-CM | POA: Diagnosis not present

## 2020-04-29 MED ORDER — PANTOPRAZOLE SODIUM 40 MG PO TBEC: 40.0000 mg | DELAYED_RELEASE_TABLET | Freq: Every day | ORAL | 4 refills | Status: DC

## 2020-04-29 NOTE — Progress Notes (Signed)
Maylon Peppers, M.D. Gastroenterology & Hepatology [facility] [address] [place], [place] [address]  Primary Care Physician: Kathyrn Drown, MD [place] [id number]  I will communicate my assessment and recommendations to the referring MD via EMR. "Note: Occasional unusual wording and randomly placed punctuation marks may result from the use of speech recognition technology to transcribe this document"  Problems: 1. GERD  History of Present Illness: [name] is a 79 y.o. male with past medical history of skin cancer, hypertension, DVT, PE, paroxysmal atrial fibrillation on anticoagulation, hyperlipidemia and GERD, who presents for follow up of GERD.  The patient was last seen on 08/14/2019. At that time, the patient was continued on pantoprazole 40 mg every day given improvement of his symptoms.  The patient denies having any complaints at the moment.  States that sometimes he has heartburn if he skips a dose of his medication and has a large meal at night but he has tried to be consistent with the medication intake.The patient denies having any nausea, dysphagia, odynophagia, vomiting, fever, chills, hematochezia, melena, hematemesis, abdominal distention, abdominal pain, diarrhea, jaundice, pruritus or weight loss.  Past Medical History: Past Medical History:  Diagnosis Date  . Arthritis   . Cancer ([place])    skin  . Carpal tunnel syndrome   . Cataract    left eye  . Chronic back pain    multiple back surgeries  . Complication of anesthesia    pt states woke up during hemorroid surgery  . Constipation    Metamucil every mornind  . DVT (deep venous thrombosis) ([place]) 03/2007   left leg   . GERD (gastroesophageal reflux disease)    takes Protonix daily  . Glaucoma   . History of colon polyps   . History of hyperglycemia   . History of MRSA infection 1992  . HTN (hypertension)    takes   Ramipril daily  . Hyperlipidemia    takes Pravastatin 3 times a week  . Joint pain   . Joint swelling   . Nocturia   . Paroxysmal atrial fibrillation (HCC)    takes Pradaxa daily, Chads2vasc score 2(07/11/14)  . Pneumonia    many yrs ago  . Pulmonary embolism ([place]) 2008  . Urinary frequency   . Urinary urgency     Past Surgical History: Past Surgical History:  Procedure Laterality Date  . BIOPSY  04/01/2018   Procedure: BIOPSY;  Surgeon: Rogene Houston, MD;  [facility];  Service: Endoscopy;;  gastric  . CARDIAC CATHETERIZATION  2008  . CARPAL TUNNEL RELEASE Right   . cataract surgery Right   . cervical and lumbar surgery    . COLONOSCOPY  04/24/2011   Procedure: COLONOSCOPY;  Surgeon: Rogene Houston, MD;  [facility];  Service: Endoscopy;  Laterality: N/A;  1200  . COLONOSCOPY N/A 08/13/2016   Procedure: COLONOSCOPY;  Surgeon: Rogene Houston, MD;  [facility];  Service: Endoscopy;  Laterality: N/A;  1200  . ESOPHAGOGASTRODUODENOSCOPY  03/10/2012   Procedure: ESOPHAGOGASTRODUODENOSCOPY (EGD);  Surgeon: Rogene Houston, MD;  [facility];  Service: Endoscopy;  Laterality: N/A;  325  . ESOPHAGOGASTRODUODENOSCOPY (EGD) WITH PROPOFOL N/A 04/01/2018   Procedure: ESOPHAGOGASTRODUODENOSCOPY (EGD) WITH PROPOFOL;  Surgeon: Rogene Houston, MD;  [facility];  Service: Endoscopy;  Laterality: N/A;  . HARDWARE REMOVAL N/A 04/24/2014   Procedure: REMOVAL OF SACRAL INSTRUMENTATION WITH  [place];  Surgeon: Kristeen Miss, MD;  Location: [place] [facility];  Service: Neurosurgery;  Laterality: N/A;  . HEMORRHOIDECTOMY WITH HEMORRHOID BANDING    . KNEE ARTHROSCOPY Right   . KNEE ARTHROSCOPY WITH MEDIAL MENISECTOMY Right 08/03/2018   Procedure: RIGHT KNEE ARTHROSCOPY WITH PARTIAL MEDIAL MENISCECTOMY;  Surgeon: Leandrew Koyanagi, MD;  Location: [place];  Service: Orthopedics;  Laterality: Right;  . KNEE SURGERY     left  . LASIK     . POLYPECTOMY  08/13/2016   Procedure: POLYPECTOMY;  Surgeon: Rogene Houston, MD;  [facility];  Service: Endoscopy;;  colon  . PVI  01/24/10   afib ablation    Family History: Family History  Problem Relation Age of Onset  . Hypertension Mother   . Transient ischemic attack Mother   . Heart disease Father   . Diabetes Brother   . Healthy Daughter   . Colon cancer Sister   . Hypertension Other   . Heart disease Other     Social History: Social History   Tobacco Use  Smoking Status Former Smoker  . Packs/day: 0.25  . Years: 3.00  . Pack years: 0.75  . Types: Cigarettes  . Quit date: 03/30/1969  . Years since quitting: 51.1  Smokeless Tobacco Never Used   Social History   Substance and Sexual Activity  Alcohol Use Yes  . Alcohol/week: 14.0 standard drinks  . Types: 7 Glasses of wine, 7 Shots of liquor per week   Comment: wine nightly   Social History   Substance and Sexual Activity  Drug Use No    Allergies: Allergies  Allergen Reactions  . Sectral [Acebutolol Hcl] Other (See Comments)    Side effects  . Sulfonamide Derivatives Other (See Comments)    Runs blood pressure up  . Zocor [Simvastatin] Other (See Comments)    Joint pain    Medications: Current Outpatient Medications  Medication Sig Dispense Refill  . acetaminophen (TYLENOL) 650 MG CR tablet Take 1,300 mg by mouth once as needed for pain.    Marland Kitchen amLODipine (NORVASC) 10 MG tablet Take 1 tablet (10 mg total) by mouth daily. 90 tablet 1  . apixaban (ELIQUIS) 5 MG TABS tablet Take 1 tablet (5 mg total) by mouth 2 (two) times daily. 60 tablet 5  . diphenhydrAMINE-APAP, sleep, (TYLENOL PM EXTRA STRENGTH PO) Take 1 tablet by mouth at bedtime.    . indapamide (LOZOL) 2.5 MG tablet Take 1 tablet (2.5 mg total) by mouth daily. 90 tablet 1  . latanoprost (XALATAN) 0.005 % ophthalmic solution Place 1 drop into both eyes at bedtime.    . Oxycodone HCl 10 MG TABS Take 1 tablet 4 times daily as  needed for pain 120 tablet 0  . pantoprazole (PROTONIX) 40 MG tablet Take 1 tablet (40 mg total) by mouth daily before breakfast. 90 tablet 1  . pravastatin (PRAVACHOL) 80 MG tablet Take 1 tablet (80 mg total) by mouth at bedtime. 90 tablet 1  . ramipril (ALTACE) 10 MG capsule TAKE 1 CAPSULE (10 MG TOTAL) BY MOUTH 2 (TWO) TIMES DAILY. 180 capsule 1  . diltiazem (CARDIZEM) 30 MG tablet Take 1/2 tablet every 4 hours AS NEEDED for afib (Patient not taking: Reported on 04/29/2020) 45 tablet 1   No current facility-administered medications for this visit.    Review of Systems: GENERAL: negative for malaise, night sweats HEENT: No changes in hearing or vision, no nose bleeds or other nasal problems. NECK: Negative for  lumps, goiter, pain and significant neck swelling RESPIRATORY: Negative for cough, wheezing CARDIOVASCULAR: Negative for chest pain, leg swelling, palpitations, orthopnea GI: SEE HPI MUSCULOSKELETAL: Negative for joint pain or swelling, back pain, and muscle pain. SKIN: Negative for lesions, rash PSYCH: Negative for sleep disturbance, mood disorder and recent psychosocial stressors. HEMATOLOGY Negative for prolonged bleeding, bruising easily, and swollen nodes. ENDOCRINE: Negative for cold or heat intolerance, polyuria, polydipsia and goiter. NEURO: negative for tremor, gait imbalance, syncope and seizures. The remainder of the review of systems is noncontributory.   Physical Exam: BP (!) 159/76 (BP Location: Left Arm, Patient Position: Sitting, Cuff Size: Large)   Pulse 71   Temp 98.1 F (36.7 C) (Oral)   Ht 5\' 6"  (1.676 m)   Wt 191 lb (86.6 kg)   BMI 30.83 kg/m  GENERAL: The patient is AO x3, in no acute distress. [place]. HEENT: Head is normocephalic and atraumatic. EOMI are intact. Mouth is well hydrated and without lesions. NECK: Supple. No masses LUNGS: Clear to auscultation. No presence of rhonchi/wheezing/rales. Adequate chest expansion HEART: RRR, normal s1 and  s2. ABDOMEN: Soft, nontender, no guarding, no peritoneal signs, and nondistended. BS +. No masses. EXTREMITIES: Without any cyanosis, clubbing, rash, lesions or edema. NEUROLOGIC: AOx3, no focal motor deficit. SKIN: no jaundice, no rashes  Imaging/Labs: as above  I personally reviewed and interpreted the available labs, imaging and endoscopic files.  Impression and Plan: VALERIANO BAIN is a 79 y.o. male with past medical history of skin cancer, hypertension, DVT, PE, paroxysmal atrial fibrillation on anticoagulation, hyperlipidemia and GERD, who presents for follow up of GERD.  Patient has had adequate control of his symptoms while taking omeprazole 40 mg every day.  At the present any recurrent symptomatology when he takes his medication or red flag signs.  He should continue taking his medication at the current doses indefinitely.  No need for further endoscopic evaluation.  - Continue omeprazole 40 mg qday - RTC 1 year  All questions were answered.      Harvel Quale, MD Gastroenterology and Hepatology [facility]

## 2020-05-06 DIAGNOSIS — G5623 Lesion of ulnar nerve, bilateral upper limbs: Secondary | ICD-10-CM | POA: Diagnosis not present

## 2020-05-06 DIAGNOSIS — G5603 Carpal tunnel syndrome, bilateral upper limbs: Secondary | ICD-10-CM | POA: Diagnosis not present

## 2020-05-07 DIAGNOSIS — G5623 Lesion of ulnar nerve, bilateral upper limbs: Secondary | ICD-10-CM | POA: Diagnosis not present

## 2020-05-07 DIAGNOSIS — G5603 Carpal tunnel syndrome, bilateral upper limbs: Secondary | ICD-10-CM | POA: Diagnosis not present

## 2020-05-27 DIAGNOSIS — G5602 Carpal tunnel syndrome, left upper limb: Secondary | ICD-10-CM | POA: Diagnosis not present

## 2020-05-27 DIAGNOSIS — G5622 Lesion of ulnar nerve, left upper limb: Secondary | ICD-10-CM | POA: Diagnosis not present

## 2020-05-27 DIAGNOSIS — G5603 Carpal tunnel syndrome, bilateral upper limbs: Secondary | ICD-10-CM | POA: Diagnosis not present

## 2020-06-04 ENCOUNTER — Other Ambulatory Visit: Payer: Self-pay | Admitting: Family Medicine

## 2020-06-07 ENCOUNTER — Ambulatory Visit: Payer: PPO | Admitting: Family Medicine

## 2020-06-08 ENCOUNTER — Other Ambulatory Visit: Payer: Self-pay | Admitting: Family Medicine

## 2020-06-10 ENCOUNTER — Other Ambulatory Visit: Payer: Self-pay

## 2020-06-10 ENCOUNTER — Encounter: Payer: Self-pay | Admitting: Family Medicine

## 2020-06-10 ENCOUNTER — Ambulatory Visit (INDEPENDENT_AMBULATORY_CARE_PROVIDER_SITE_OTHER): Payer: PPO | Admitting: Family Medicine

## 2020-06-10 VITALS — BP 138/76 | HR 71 | Temp 97.5°F | Ht 66.0 in | Wt 190.2 lb

## 2020-06-10 DIAGNOSIS — Z79891 Long term (current) use of opiate analgesic: Secondary | ICD-10-CM

## 2020-06-10 MED ORDER — OXYCODONE HCL 10 MG PO TABS: ORAL_TABLET | ORAL | 0 refills | Status: DC

## 2020-06-10 MED ORDER — APIXABAN 5 MG PO TABS: ORAL_TABLET | ORAL | 5 refills | Status: DC

## 2020-06-10 NOTE — Addendum Note (Signed)
Addended by: Patsy Lager on: 06/10/2020 03:34 PM   Modules accepted: Orders

## 2020-06-10 NOTE — Progress Notes (Signed)
Subjective:    Patient ID: [name], male    DOB: 09/10/1940, 80 y.o.   MRN: [id number]  HPI  This patient was seen today for chronic pain  The medication list was reviewed and updated.   -Compliance with medication: yes  - Number patient states they take daily: 4  -when was the last dose patient took? today  The patient was advised the importance of maintaining medication and not using illegal substances with these. Patient had carpal tunnel surgery several weeks back he is recovering from it Here for refills and follow up  The patient was educated that we can provide 3 monthly scripts for their medication, it is their responsibility to follow the instructions.  Side effects or complications from medications: none  Patient is aware that pain medications are meant to minimize the severity of the pain to allow their pain levels to improve to allow for better function. They are aware of that pain medications cannot totally remove their pain. Without the pain medicine he states the pain stays around 8 with the pain medicine it comes down to about a 4 he relates can function much better with the pain medicine allows him to be able to do the things he would like to be able to do Due for UDT ( at least once per year) : 11/28/19  Fall Risk  06/10/2020 08/29/2019 03/15/2019 03/07/2019 05/25/2018  Falls in the past year? 0 0 0 0 1  Number falls in past yr: - - - - 1  Injury with Fall? - - - - 1  Risk for fall due to : Impaired balance/gait - - - -  Follow up Falls evaluation completed - Falls evaluation completed Falls evaluation completed Falls evaluation completed     Patient relates his pain medicine does good at keeping it under control denies any major setbacks     Review of Systems  Constitutional: Negative for activity change.  HENT: Negative for congestion and rhinorrhea.   Respiratory: Negative for cough and shortness of breath.   Cardiovascular: Negative for chest  pain.  Gastrointestinal: Negative for abdominal pain, diarrhea, nausea and vomiting.  Genitourinary: Negative for dysuria and hematuria.  Neurological: Negative for weakness and headaches.  Psychiatric/Behavioral: Negative for behavioral problems and confusion.       Objective:   Physical Exam Vitals reviewed.  Constitutional:      Appearance: He is well-nourished.  Cardiovascular:     Rate and Rhythm: Normal rate and regular rhythm.     Heart sounds: Normal heart sounds. No murmur heard.   Pulmonary:     Effort: Pulmonary effort is normal.     Breath sounds: Normal breath sounds.  Musculoskeletal:        General: No edema.  Lymphadenopathy:     Cervical: No cervical adenopathy.  Neurological:     Mental Status: He is alert.  Psychiatric:        Behavior: Behavior normal.           Assessment & Plan:  The patient was seen in followup for chronic pain. A review over at their current pain status was discussed. Drug registry was checked. Prescriptions were given.  Regular follow-up recommended. Discussion was held regarding the importance of compliance with medication as well as pain medication contract.  Patient was informed that medication may cause drowsiness and should not be combined  with other medications/alcohol or street drugs. If the patient feels medication is causing altered alertness then do not  drive or operate dangerous equipment.  Has chronic pain and discomfort takes his medication does good job keeping things under control does not want to change number per day  We will do a more comprehensive visit before next visit

## 2020-07-03 DIAGNOSIS — L0202 Furuncle of face: Secondary | ICD-10-CM | POA: Diagnosis not present

## 2020-07-03 DIAGNOSIS — Z08 Encounter for follow-up examination after completed treatment for malignant neoplasm: Secondary | ICD-10-CM | POA: Diagnosis not present

## 2020-07-03 DIAGNOSIS — Z85828 Personal history of other malignant neoplasm of skin: Secondary | ICD-10-CM | POA: Diagnosis not present

## 2020-07-11 DIAGNOSIS — M5416 Radiculopathy, lumbar region: Secondary | ICD-10-CM | POA: Diagnosis not present

## 2020-07-24 DIAGNOSIS — M25552 Pain in left hip: Secondary | ICD-10-CM | POA: Diagnosis not present

## 2020-07-25 ENCOUNTER — Other Ambulatory Visit: Payer: Self-pay | Admitting: Neurological Surgery

## 2020-07-25 ENCOUNTER — Other Ambulatory Visit (HOSPITAL_COMMUNITY): Payer: Self-pay | Admitting: Neurological Surgery

## 2020-07-25 DIAGNOSIS — M25552 Pain in left hip: Secondary | ICD-10-CM

## 2020-07-30 ENCOUNTER — Encounter: Payer: Self-pay | Admitting: Family Medicine

## 2020-07-30 ENCOUNTER — Ambulatory Visit (INDEPENDENT_AMBULATORY_CARE_PROVIDER_SITE_OTHER): Payer: PPO | Admitting: Family Medicine

## 2020-07-30 ENCOUNTER — Other Ambulatory Visit: Payer: Self-pay

## 2020-07-30 VITALS — BP 177/86 | HR 66 | Temp 98.0°F | Wt 194.4 lb

## 2020-07-30 DIAGNOSIS — Z79891 Long term (current) use of opiate analgesic: Secondary | ICD-10-CM

## 2020-07-30 DIAGNOSIS — M545 Low back pain, unspecified: Secondary | ICD-10-CM

## 2020-07-30 MED ORDER — OXYCODONE HCL 10 MG PO TABS: ORAL_TABLET | ORAL | 0 refills | Status: DC

## 2020-07-30 NOTE — Patient Instructions (Signed)
Please call us next thurs or fri and I will call back to talk to you about MRI

## 2020-07-30 NOTE — Progress Notes (Signed)
   Subjective:    Patient ID: [name], male    DOB: 1941/03/08, 80 y.o.   MRN: [id number]  HPI Pt here for back pain. Pt states he is having an issue and it is getting worse. Going on for 30 years. Has had 9 back operations.  Left side on top of hip bone; radiates to side  Severe left lower back pain Feels as if pain going into the hip Went to er 2 times last May This patient is having a difficult time He is having severe back pain discomfort it radiates into the left hip He saw Dr. Ellene Route who ordered an MRI of his pelvis Patient also states that he saw Dr.Xu for further evaluation of the possibility of hip in he was told by the orthopedist that was not his hip it was his back His quality of life has declined because of the severity of the pain he is having to take pain medicine for 5 times per day Pain medicine does help take the edge off the pain and allows him to function better. Review of Systems    Please see above Objective:   Physical Exam Lungs clear respiratory rate normal heart regular pulse normal low back subjective discomfort in the left lower back radiates into the left hip.  He walks with a cane bent over has very difficult time standing twisting turning       Assessment & Plan:  Has had multiple surgeries Has upcoming MRI Patient wants to wait to see what that MRI shows potentially he may want to go see a different back specialist or go back to see Dr. Ellene Route. We did adjust his pain medicine no more than 5/day 130 tablets/month patient voices understanding Recheck within 2 months

## 2020-08-07 ENCOUNTER — Ambulatory Visit (HOSPITAL_COMMUNITY)
Admission: RE | Admit: 2020-08-07 | Discharge: 2020-08-07 | Disposition: A | Discharge status: Home / Self Care | Payer: PPO | Source: Ambulatory Visit | Attending: Neurological Surgery | Admitting: Neurological Surgery

## 2020-08-07 DIAGNOSIS — N400 Enlarged prostate without lower urinary tract symptoms: Secondary | ICD-10-CM | POA: Diagnosis not present

## 2020-08-07 DIAGNOSIS — M5137 Other intervertebral disc degeneration, lumbosacral region: Secondary | ICD-10-CM | POA: Diagnosis not present

## 2020-08-07 DIAGNOSIS — M160 Bilateral primary osteoarthritis of hip: Secondary | ICD-10-CM | POA: Diagnosis not present

## 2020-08-07 DIAGNOSIS — K4090 Unilateral inguinal hernia, without obstruction or gangrene, not specified as recurrent: Secondary | ICD-10-CM | POA: Diagnosis not present

## 2020-08-07 DIAGNOSIS — M25552 Pain in left hip: Secondary | ICD-10-CM | POA: Diagnosis not present

## 2020-08-07 MED ORDER — GADOBUTROL 1 MMOL/ML IV SOLN
10.0000 mL | Freq: Once | INTRAVENOUS | Status: AC | PRN
  Administered 2020-08-07: 10 mL via INTRAVENOUS

## 2020-08-08 ENCOUNTER — Telehealth: Payer: Self-pay

## 2020-08-08 NOTE — Telephone Encounter (Signed)
Pt is wanting to touch base with Dr Nicki Reaper he had MRI done yesterday and wanted Dr Nicki Reaper to read it.  Pt call back [phone]

## 2020-08-08 NOTE — Telephone Encounter (Signed)
Please advise. Thank you

## 2020-08-09 DIAGNOSIS — M5416 Radiculopathy, lumbar region: Secondary | ICD-10-CM | POA: Diagnosis not present

## 2020-08-09 NOTE — Telephone Encounter (Signed)
Spoke with him about results he will follow up with dr Ellene Route

## 2020-08-12 ENCOUNTER — Other Ambulatory Visit: Payer: Self-pay | Admitting: Family Medicine

## 2020-08-13 ENCOUNTER — Ambulatory Visit (INDEPENDENT_AMBULATORY_CARE_PROVIDER_SITE_OTHER): Payer: PPO | Admitting: Internal Medicine

## 2020-08-26 DIAGNOSIS — M5416 Radiculopathy, lumbar region: Secondary | ICD-10-CM | POA: Diagnosis not present

## 2020-08-29 DIAGNOSIS — C44319 Basal cell carcinoma of skin of other parts of face: Secondary | ICD-10-CM | POA: Diagnosis not present

## 2020-08-29 DIAGNOSIS — L570 Actinic keratosis: Secondary | ICD-10-CM | POA: Diagnosis not present

## 2020-08-29 DIAGNOSIS — X32XXXD Exposure to sunlight, subsequent encounter: Secondary | ICD-10-CM | POA: Diagnosis not present

## 2020-08-29 DIAGNOSIS — L0202 Furuncle of face: Secondary | ICD-10-CM | POA: Diagnosis not present

## 2020-09-09 ENCOUNTER — Other Ambulatory Visit: Payer: Self-pay

## 2020-09-09 ENCOUNTER — Encounter: Payer: Self-pay | Admitting: Family Medicine

## 2020-09-09 ENCOUNTER — Ambulatory Visit (INDEPENDENT_AMBULATORY_CARE_PROVIDER_SITE_OTHER): Payer: PPO | Admitting: Family Medicine

## 2020-09-09 VITALS — BP 132/82 | Temp 97.2°F | Wt 191.6 lb

## 2020-09-09 DIAGNOSIS — M545 Low back pain, unspecified: Secondary | ICD-10-CM

## 2020-09-09 MED ORDER — OXYCODONE HCL 10 MG PO TABS: ORAL_TABLET | ORAL | 0 refills | Status: DC

## 2020-09-09 NOTE — Progress Notes (Signed)
Subjective:    Patient ID: [name], male    DOB: 1941/02/28, 80 y.o.   MRN: [id number]  HPI Pt here for follow up. Pt would like to be referred to ortho for back pain.  Pt has seen a physician with Dr Para March office Madilyn Fireman?).  (Pt seen last month for pain management)   This patient was seen today for chronic pain  The medication list was reviewed and updated.  Location of Pain for which the patient has been treated with regarding narcotics: Neck and lower back radiates into the left hip region also then radiates into the legs  Onset of this pain: Has had this for years   -Compliance with medication: Good compliance of medicine  - Number patient states they take daily: Anywhere from 4 or 5/day  -when was the last dose patient took?  Earlier today  The patient was advised the importance of maintaining medication and not using illegal substances with these.  Here for refills and follow up  The patient was educated that we can provide 3 monthly scripts for their medication, it is their responsibility to follow the instructions.  Side effects or complications from medications: None  Patient is aware that pain medications are meant to minimize the severity of the pain to allow their pain levels to improve to allow for better function. They are aware of that pain medications cannot totally remove their pain.  Due for UDT ( at least once per year) : Later this year in the summer  Scale of 1 to 10 ( 1 is least 10 is most) Your pain level without the medicine: 9 Your pain level with medication 5  Scale 1 to 10 ( 1-helps very little, 10 helps very well) How well does your pain medication reduce your pain so you can function better through out the day?  7  Quality of the pain: Burning throbbing aching  Persistence of the pain: All day long and all night  Modifying factors: Worse with activity better with rest a lot of back stiffness  Medications do benefit the  patient     Review of Systems  Constitutional: Negative for activity change, fatigue and fever.  HENT: Negative for congestion and rhinorrhea.   Respiratory: Negative for cough and shortness of breath.   Cardiovascular: Negative for chest pain and leg swelling.  Gastrointestinal: Negative for abdominal pain, diarrhea and nausea.  Genitourinary: Negative for dysuria and hematuria.  Neurological: Negative for weakness and headaches.  Psychiatric/Behavioral: Negative for agitation and behavioral problems.       Objective:   Physical Exam Vitals reviewed.  Cardiovascular:     Rate and Rhythm: Normal rate and regular rhythm.     Heart sounds: Normal heart sounds. No murmur heard.   Pulmonary:     Effort: Pulmonary effort is normal.     Breath sounds: Normal breath sounds.  Lymphadenopathy:     Cervical: No cervical adenopathy.  Neurological:     Mental Status: He is alert.  Psychiatric:        Behavior: Behavior normal.   Patient has decreased range of motion of the back with rotation and extension increased pain with straight leg raise on the left        Assessment & Plan:  1. Lumbar pain Referral to orthopedics has seen neurosurgery before they feel like they have nothing left to offer the patient  Patient under chronic pain management with [place] 2 additional prescriptions are given he will follow-up  in 2 months for his regular pain management follow-up visit follow-up sooner problems - Ambulatory referral to Orthopedic Surgery

## 2020-09-12 ENCOUNTER — Other Ambulatory Visit: Payer: Self-pay | Admitting: Family Medicine

## 2020-09-16 DIAGNOSIS — M545 Low back pain, unspecified: Secondary | ICD-10-CM | POA: Diagnosis not present

## 2020-09-18 ENCOUNTER — Encounter (HOSPITAL_COMMUNITY): Payer: Self-pay | Admitting: Nurse Practitioner

## 2020-09-18 ENCOUNTER — Other Ambulatory Visit: Payer: Self-pay

## 2020-09-18 ENCOUNTER — Ambulatory Visit (HOSPITAL_COMMUNITY)
Admission: RE | Admit: 2020-09-18 | Discharge: 2020-09-18 | Disposition: A | Discharge status: Home / Self Care | Payer: PPO | Source: Ambulatory Visit | Attending: Nurse Practitioner | Admitting: Nurse Practitioner

## 2020-09-18 VITALS — BP 148/70 | HR 70 | Ht 66.0 in | Wt 192.2 lb

## 2020-09-18 DIAGNOSIS — D6869 Other thrombophilia: Secondary | ICD-10-CM | POA: Diagnosis not present

## 2020-09-18 DIAGNOSIS — F17210 Nicotine dependence, cigarettes, uncomplicated: Secondary | ICD-10-CM | POA: Diagnosis not present

## 2020-09-18 DIAGNOSIS — Z8249 Family history of ischemic heart disease and other diseases of the circulatory system: Secondary | ICD-10-CM | POA: Diagnosis not present

## 2020-09-18 DIAGNOSIS — I4891 Unspecified atrial fibrillation: Secondary | ICD-10-CM | POA: Diagnosis not present

## 2020-09-18 DIAGNOSIS — I480 Paroxysmal atrial fibrillation: Secondary | ICD-10-CM

## 2020-09-18 DIAGNOSIS — Z7901 Long term (current) use of anticoagulants: Secondary | ICD-10-CM | POA: Insufficient documentation

## 2020-09-18 DIAGNOSIS — R5383 Other fatigue: Secondary | ICD-10-CM | POA: Diagnosis not present

## 2020-09-18 LAB — CBC
HCT: 42.6 % (ref 39.0–52.0)
Hemoglobin: 14 g/dL (ref 13.0–17.0)
MCH: 31.7 pg (ref 26.0–34.0)
MCHC: 32.9 g/dL (ref 30.0–36.0)
MCV: 96.4 fL (ref 80.0–100.0)
Platelets: 263 10*3/uL (ref 150–400)
RBC: 4.42 MIL/uL (ref 4.22–5.81)
RDW: 12.6 % (ref 11.5–15.5)
WBC: 9 10*3/uL (ref 4.0–10.5)
nRBC: 0 % (ref 0.0–0.2)

## 2020-09-18 LAB — BASIC METABOLIC PANEL
Anion gap: 8 (ref 5–15)
BUN: 24 mg/dL — ABNORMAL HIGH (ref 8–23)
CO2: 23 mmol/L (ref 22–32)
Calcium: 8.8 mg/dL — ABNORMAL LOW (ref 8.9–10.3)
Chloride: 105 mmol/L (ref 98–111)
Creatinine, Ser: 1.11 mg/dL (ref 0.61–1.24)
GFR, Estimated: >60 mL/min (ref >60)
Glucose, Bld: 183 mg/dL — ABNORMAL HIGH (ref 70–99)
Potassium: 4.3 mmol/L (ref 3.5–5.1)
Sodium: 136 mmol/L (ref 135–145)

## 2020-09-18 NOTE — Progress Notes (Signed)
Patient ID: MARSHAUN LORTIE, male   DOB: October 19, 1940, 80 y.o.   MRN: [id number]     Primary Care Physician: Kathyrn Drown, MD Referring Physician: [name] ZAION HREHA is a 80 y.o. male with a h/o PAF, afib ablation 2011. Marland Kitchen No afib other than around 12 hours on Christmas Day, 2017, after hearing about the death of his sister. He continues to take pradaxa on a  regular basis.  F/u 07/15/16. He reports just a few flutters now and then. He remains very active and actually had a rupture of his bicep tendon last September after digging up and pitching a rock about the size of a "coconut.". Surgery was not recommended due to his age and it has repaired to some degree with time. He continues on pradaxa with last bmet drawn 06/02/16 with creatinine of 1.10. No bleeding issues.   F/u afib clinic, 09/08/17. No further afib since December 2017. No issues with anticoagulation. Last bmet with stable creatinine with at 0.96.his only complaint is that he can not do as much work outside as he was doing last Spring." I just give out and have to stop." No chest pain or shortness of breath. Has a freind that felt the same way and he was found to need a stent. His energy improved a lot after the procedure.  Seeing pt in afib clinic 10/25/18  for c/o intermittent palpitations for the  last several weeks and fatigue. I had a virtual visit with pt 4/27 and he did not have these c/o's then. He was telling me about a fall from a ladder in the fall and broke his knee with LOC, "tore up left shoulder." He adds today that he feels that he has not been right since then. 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 I did a stress test lest year, it was read low risk but did have a few areas that ischemia could not be ruled out. He has some chest tightness with irregular heart beat. He is winded with activities. He is in SR today.   F/u in afib clinic, 09/18/20 for yearly  f/u. He reports that he has not had any afib. His  health has been ok for the last year, but his son in law died form covid last fall and his  daughter was very sick for a while as well. The daughter, who has a disability,  has moved back to his home which has placed stress on the father. He denise any chest pain or exertional dyspnea.   Today, he denies symptoms of palpitations,+ chest pain with palpitations,+ exertional  shortness of breath, orthopnea, PND, lower extremity edema, dizziness, presyncope, syncope, or neurologic sequela. + for exertional fatigue. The patient is tolerating medications without difficulties and is otherwise without complaint today.   Past Medical History:  Diagnosis Date  . Arthritis   . Cancer ([place])    skin  . Carpal tunnel syndrome   . Cataract    left eye  . Chronic back pain    multiple back surgeries  . Complication of anesthesia    pt states woke up during hemorroid surgery  . Constipation    Metamucil every mornind  . DVT (deep venous thrombosis) ([place]) 03/2007   left leg   . GERD (gastroesophageal reflux disease)    takes Protonix daily  . Glaucoma   . History of  colon polyps   . History of hyperglycemia   . History of MRSA infection 1992  . HTN (hypertension)    takes  Ramipril daily  . Hyperlipidemia    takes Pravastatin 3 times a week  . Joint pain   . Joint swelling   . Nocturia   . Paroxysmal atrial fibrillation ([facility])    takes Pradaxa daily, Chads2vasc score 2(07/11/14)  . Pneumonia    many yrs ago  . Pulmonary embolism ([place]) 2008  . Urinary frequency   . Urinary urgency    Past Surgical History:  Procedure Laterality Date  . BIOPSY  04/01/2018   Procedure: BIOPSY;  Surgeon: Rogene Houston, MD;  [facility];  Service: Endoscopy;;  gastric  . CARDIAC CATHETERIZATION  2008  . CARPAL TUNNEL RELEASE Right   . cataract surgery Right   . cervical and lumbar surgery    . COLONOSCOPY  04/24/2011   Procedure: COLONOSCOPY;   Surgeon: Rogene Houston, MD;  [facility];  Service: Endoscopy;  Laterality: N/A;  1200  . COLONOSCOPY N/A 08/13/2016   Procedure: COLONOSCOPY;  Surgeon: Rogene Houston, MD;  [facility];  Service: Endoscopy;  Laterality: N/A;  1200  . ESOPHAGOGASTRODUODENOSCOPY  03/10/2012   Procedure: ESOPHAGOGASTRODUODENOSCOPY (EGD);  Surgeon: Rogene Houston, MD;  [facility];  Service: Endoscopy;  Laterality: N/A;  325  . ESOPHAGOGASTRODUODENOSCOPY (EGD) WITH PROPOFOL N/A 04/01/2018   Procedure: ESOPHAGOGASTRODUODENOSCOPY (EGD) WITH PROPOFOL;  Surgeon: Rogene Houston, MD;  [facility];  Service: Endoscopy;  Laterality: N/A;  . HARDWARE REMOVAL N/A 04/24/2014   Procedure: REMOVAL OF SACRAL INSTRUMENTATION WITH [place];  Surgeon: Kristeen Miss, MD;  Location: [facility];  Service: Neurosurgery;  Laterality: N/A;  . HEMORRHOIDECTOMY WITH HEMORRHOID BANDING    . KNEE ARTHROSCOPY Right   . KNEE ARTHROSCOPY WITH MEDIAL MENISECTOMY Right 08/03/2018   Procedure: RIGHT KNEE ARTHROSCOPY WITH PARTIAL MEDIAL MENISCECTOMY;  Surgeon: Leandrew Koyanagi, MD;  Location: [facility];  Service: Orthopedics;  Laterality: Right;  . KNEE SURGERY     left  . LASIK    . POLYPECTOMY  08/13/2016   Procedure: POLYPECTOMY;  Surgeon: Rogene Houston, MD;  [facility];  Service: Endoscopy;;  colon  . PVI  01/24/10   afib ablation    Current Outpatient Medications  Medication Sig Dispense Refill  . acetaminophen (TYLENOL) 650 MG CR tablet Take 1,300 mg by mouth once as needed for pain.    Marland Kitchen amLODipine (NORVASC) 10 MG tablet TAKE 1 TABLET BY MOUTH EVERY DAY 90 tablet 1  . apixaban (ELIQUIS) 5 MG TABS tablet TAKE 1 TABLET BY MOUTH TWICE A DAY. STOP PRADAXA 60 tablet 5  . diltiazem (CARDIZEM) 30 MG tablet Take 1/2 tablet every 4 hours AS NEEDED for afib 45 tablet 1  . diphenhydrAMINE-APAP, sleep, (TYLENOL PM EXTRA STRENGTH PO) Take 1 tablet by mouth at bedtime.    .  indapamide (LOZOL) 2.5 MG tablet TAKE 1 TABLET BY MOUTH EVERY DAY 90 tablet 1  . latanoprost (XALATAN) 0.005 % ophthalmic solution Place 1 drop into both eyes at bedtime.    Marland Kitchen MEDROL 4 MG tablet Take by mouth.    . Oxycodone HCl 10 MG TABS Take one tablet po 4 to 5 times per day 130 tablet 0  . Oxycodone HCl 10 MG TABS One qid prn pain 130 tablet 0  . Oxycodone HCl 10 MG TABS Take 1 tablet  po 4 to 5 times per day 130 tablet 0  . pantoprazole (PROTONIX) 40 MG tablet Take 1 tablet (40 mg total) by mouth daily before breakfast. 90 tablet 4  . pravastatin (PRAVACHOL) 80 MG tablet TAKE 1 TABLET BY MOUTH EVERYDAY AT BEDTIME 90 tablet 0  . ramipril (ALTACE) 10 MG capsule TAKE 1 CAPSULE (10 MG TOTAL) BY MOUTH 2 (TWO) TIMES DAILY. 180 capsule 1   No current facility-administered medications for this encounter.    Allergies  Allergen Reactions  . Sectral [Acebutolol Hcl] Other (See Comments)    Side effects  . Sulfonamide Derivatives Other (See Comments)    Runs blood pressure up  . Zocor [Simvastatin] Other (See Comments)    Joint pain    Social History   Socioeconomic History  . Marital status: Married    Spouse name: Not on file  . Number of children: Not on file  . Years of education: Not on file  . Highest education level: Not on file  Occupational History  . Occupation: retired  Tobacco Use  . Smoking status: Former Smoker    Packs/day: 0.25    Years: 3.00    Pack years: 0.75    Types: Cigarettes    Quit date: 03/30/1969    Years since quitting: 51.5  . Smokeless tobacco: Never Used  Vaping Use  . Vaping Use: Never used  Substance and Sexual Activity  . Alcohol use: Yes    Alcohol/week: 14.0 standard drinks    Types: 7 Glasses of wine, 7 Shots of liquor per week    Comment: wine nightly  . Drug use: No  . Sexual activity: Yes  Other Topics Concern  . Not on file  Social History Narrative  . Not on file   Social Determinants of Health   Financial Resource Strain:  Not on file  Food Insecurity: Not on file  Transportation Needs: Not on file  Physical Activity: Not on file  Stress: Not on file  Social Connections: Not on file  Intimate Partner Violence: Not on file    Family History  Problem Relation Age of Onset  . Hypertension Mother   . Transient ischemic attack Mother   . Heart disease Father   . Diabetes Brother   . Healthy Daughter   . Colon cancer Sister   . Hypertension Other   . Heart disease Other     ROS- All systems are reviewed and negative except as per the HPI above  Physical Exam: Vitals:   09/18/20 1000  BP: (!) 148/70  Pulse: 70  Weight: 87.2 kg  Height: 5\' 6"  (1.676 m)    GEN- The patient is well appearing, alert and oriented x 3 today.   Head- normocephalic, atraumatic Eyes-  Sclera clear, conjunctiva pink Ears- hearing intact Oropharynx- clear Neck- supple, no JVP Lymph- no cervical lymphadenopathy Lungs- Clear to ausculation bilaterally, normal work of breathing Heart- Regular rate and rhythm, no murmurs, rubs or gallops, PMI not laterally displaced GI- soft, NT, ND, + BS Extremities- no clubbing, cyanosis, or edema MS- no significant deformity or atrophy Skin- no rash or lesion Psych- euthymic mood, full affect Neuro- strength and sensation are intact  EKG- NSR at 70 bpm pr int 192 ms, qrs int 94 ms, qtc 438  ms Cannot rule out inferior MI  Epic records reviewed  Assessment and Plan: 1. Afib  Does not report any afib today  Has cardizem 30 mg to use as needed Continue eliquis 5 mg bid  Chadsvasc score of at least 3 Cbc/bmet today   2. Exertional fatigue Pt had c/o  of this last fall but feels it is worse. EKG cannot r/o old inferior MI  Possible anginal equivalent Will order a lexi myoview Update echo   F/u in one year  Butch Penny C. Damari Suastegui, [facility] [address] [place], [place] [address] [phone]

## 2020-09-30 DIAGNOSIS — Z08 Encounter for follow-up examination after completed treatment for malignant neoplasm: Secondary | ICD-10-CM | POA: Diagnosis not present

## 2020-09-30 DIAGNOSIS — Z85828 Personal history of other malignant neoplasm of skin: Secondary | ICD-10-CM | POA: Diagnosis not present

## 2020-09-30 DIAGNOSIS — D0472 Carcinoma in situ of skin of left lower limb, including hip: Secondary | ICD-10-CM | POA: Diagnosis not present

## 2020-10-13 ENCOUNTER — Other Ambulatory Visit: Payer: Self-pay | Admitting: Family Medicine

## 2020-10-16 ENCOUNTER — Telehealth: Payer: Self-pay

## 2020-10-16 NOTE — Telephone Encounter (Signed)
Pt had been having lower abdominal pain and wanting to find out from Dr Nicki Reaper what he needs to do?  Pt call back [phone]

## 2020-10-17 ENCOUNTER — Encounter: Payer: Self-pay | Admitting: Family Medicine

## 2020-10-17 ENCOUNTER — Other Ambulatory Visit: Payer: Self-pay

## 2020-10-17 ENCOUNTER — Ambulatory Visit: Payer: PPO | Admitting: Urology

## 2020-10-17 ENCOUNTER — Ambulatory Visit (INDEPENDENT_AMBULATORY_CARE_PROVIDER_SITE_OTHER): Payer: PPO | Admitting: Family Medicine

## 2020-10-17 ENCOUNTER — Encounter: Payer: Self-pay | Admitting: Urology

## 2020-10-17 VITALS — BP 131/82 | HR 75 | Ht 66.0 in | Wt 183.0 lb

## 2020-10-17 VITALS — BP 153/69 | HR 78 | Temp 98.2°F | Ht 66.0 in | Wt 183.0 lb

## 2020-10-17 DIAGNOSIS — N401 Enlarged prostate with lower urinary tract symptoms: Secondary | ICD-10-CM | POA: Diagnosis not present

## 2020-10-17 DIAGNOSIS — Z125 Encounter for screening for malignant neoplasm of prostate: Secondary | ICD-10-CM

## 2020-10-17 DIAGNOSIS — Z79899 Other long term (current) drug therapy: Secondary | ICD-10-CM | POA: Diagnosis not present

## 2020-10-17 DIAGNOSIS — K6289 Other specified diseases of anus and rectum: Secondary | ICD-10-CM | POA: Diagnosis not present

## 2020-10-17 DIAGNOSIS — R3915 Urgency of urination: Secondary | ICD-10-CM

## 2020-10-17 DIAGNOSIS — R351 Nocturia: Secondary | ICD-10-CM

## 2020-10-17 DIAGNOSIS — N138 Other obstructive and reflux uropathy: Secondary | ICD-10-CM

## 2020-10-17 DIAGNOSIS — N3941 Urge incontinence: Secondary | ICD-10-CM

## 2020-10-17 DIAGNOSIS — K5900 Constipation, unspecified: Secondary | ICD-10-CM | POA: Diagnosis not present

## 2020-10-17 LAB — URINALYSIS, ROUTINE W REFLEX MICROSCOPIC
Bilirubin, UA: NEGATIVE
Glucose, UA: NEGATIVE
Ketones, UA: NEGATIVE
Leukocytes,UA: NEGATIVE
Nitrite, UA: NEGATIVE
Protein,UA: NEGATIVE
RBC, UA: NEGATIVE
Specific Gravity, UA: 1.025 (ref 1.005–1.030)
Urobilinogen, Ur: 0.2 mg/dL (ref 0.2–1.0)
pH, UA: 6.5 (ref 5.0–7.5)

## 2020-10-17 MED ORDER — MIRABEGRON ER 25 MG PO TB24: ORAL_TABLET | ORAL | 0 refills | Status: DC

## 2020-10-17 NOTE — Progress Notes (Signed)
PVR=56 ml  Urological Symptom Review  Patient is experiencing the following symptoms: Get up at night to urinate Leakage of urine Stream starts and stops   Review of Systems  Gastrointestinal (upper)  : Negative for upper GI symptoms  Gastrointestinal (lower) : Negative for lower GI symptoms  Constitutional : Night Sweats  Skin: Negative for skin symptoms  Eyes: Negative for eye symptoms  Ear/Nose/Throat : Negative for Ear/Nose/Throat symptoms  Hematologic/Lymphatic: Easy bruising  Cardiovascular : Negative for cardiovascular symptoms  Respiratory : Negative for respiratory symptoms  Endocrine: Negative for endocrine symptoms  Musculoskeletal: Negative for musculoskeletal symptoms  Neurological: Negative for neurological symptoms  Psychologic: Negative for psychiatric symptoms

## 2020-10-17 NOTE — Progress Notes (Addendum)
   Subjective:    Patient ID: TRYGG MANTZ, male    DOB: 29-Aug-1940, 80 y.o.   MRN: [id number]  HPIrectal pain that pt thinks is related to his back pain.  States he is often having to help himself with bowel movement.  Relates rectal pain.  Relates some lower back pain but denies abdominal pain.  No dysuria or urinary frequency.   No bleeding.  Denies weight loss.  Trying OTC medications but this does not do dramatically well for him  Review of Systems     Objective:   Physical Exam  Lungs clear heart regular low back subjective tenderness rectal exam has poor rectal tone  On rectal exam, rectal tone is poor no masses were felt prostate mildly enlarged.  Prostate was not tender.  No stool in the vault    Assessment & Plan:  1. Constipation, unspecified constipation type Continue MiraLAX May add Metamucil twice daily follow-up with GI.  May need to have a flex sig because he is having increased discomfort and trouble with bowel movements that was not happening before this is just had onset over the past couple weeks - Ambulatory referral to Gastroenterology - CBC with Differential/Platelet - Comprehensive metabolic panel  2. Rectal pain Intermittent rectal pain no abscesses noted very important for patient to see gastroenterology - Ambulatory referral to Gastroenterology - CBC with Differential/Platelet - Comprehensive metabolic panel  3. Screening for prostate cancer Screening PSA ordered prostate enlarged - PSA Will share with urologist 4. High risk medication use Lab work ordered - CBC with Differential/Platelet - Comprehensive metabolic panel

## 2020-10-17 NOTE — Telephone Encounter (Signed)
Patient was spoken to and was instructed to come Thursday at 11:00

## 2020-10-17 NOTE — Progress Notes (Signed)
Subjective: 1. Enlarged prostate with urinary obstruction   2. Urgency of urination   3. Urge incontinence   4. Nocturia      Consult requested by Dr. [name]  [name] returns today in f/u.  He was last seen in 2018 for BPH with BOO with urethral bleeding that was associated with urethral distention.  He had been on finasteride but has been off for a while.  He currently reports urgency and has UUI.  He has nocturia x 2.  His last PSA was stable at 2.0 in 1/19.  Dr. Wolfgang Phoenix ordered one that was drawn today.  His PVR is 24ml.   He has had some recent constipation that is associated with chronic opioid for low back pain.  He had a CT stone study in 5/21 that showed no stones.    IPSS    Row Name 10/17/20 1600         International Prostate Symptom Score   How often have you had the sensation of not emptying your bladder? About half the time     How often have you had to urinate less than every two hours? About half the time     How often have you found you stopped and started again several times when you urinated? Not at All     How often have you found it difficult to postpone urination? Almost always     How often have you had a weak urinary stream? Less than 1 in 5 times     How often have you had to strain to start urination? Not at All     How many times did you typically get up at night to urinate? 3 Times     Total IPSS Score 15           Quality of Life due to urinary symptoms   If you were to spend the rest of your life with your urinary condition just the way it is now how would you feel about that? Unhappy            ROS:  ROS  Allergies  Allergen Reactions  . Sectral [Acebutolol Hcl] Other (See Comments)    Side effects  . Sulfonamide Derivatives Other (See Comments)    Runs blood pressure up  . Zocor [Simvastatin] Other (See Comments)    Joint pain    Past Medical History:  Diagnosis Date  . Arthritis   . Cancer ([place])    skin  . Carpal tunnel  syndrome   . Cataract    left eye  . Chronic back pain    multiple back surgeries  . Complication of anesthesia    pt states woke up during hemorroid surgery  . Constipation    Metamucil every mornind  . DVT (deep venous thrombosis) ([place]) 03/2007   left leg   . GERD (gastroesophageal reflux disease)    takes Protonix daily  . Glaucoma   . History of colon polyps   . History of hyperglycemia   . History of MRSA infection 1992  . HTN (hypertension)    takes  Ramipril daily  . Hyperlipidemia    takes Pravastatin 3 times a week  . Joint pain   . Joint swelling   . Nocturia   . Paroxysmal atrial fibrillation ([facility])    takes Pradaxa daily, Chads2vasc score 2(07/11/14)  . Pneumonia    many yrs ago  . Pulmonary embolism ([place]) 2008  . Urinary frequency   .  Urinary urgency     Past Surgical History:  Procedure Laterality Date  . BIOPSY  04/01/2018   Procedure: BIOPSY;  Surgeon: Rogene Houston, MD;  [facility];  Service: Endoscopy;;  gastric  . CARDIAC CATHETERIZATION  2008  . CARPAL TUNNEL RELEASE Right   . cataract surgery Right   . cervical and lumbar surgery    . COLONOSCOPY  04/24/2011   Procedure: COLONOSCOPY;  Surgeon: Rogene Houston, MD;  [facility];  Service: Endoscopy;  Laterality: N/A;  1200  . COLONOSCOPY N/A 08/13/2016   Procedure: COLONOSCOPY;  Surgeon: Rogene Houston, MD;  [facility];  Service: Endoscopy;  Laterality: N/A;  1200  . ESOPHAGOGASTRODUODENOSCOPY  03/10/2012   Procedure: ESOPHAGOGASTRODUODENOSCOPY (EGD);  Surgeon: Rogene Houston, MD;  [facility];  Service: Endoscopy;  Laterality: N/A;  325  . ESOPHAGOGASTRODUODENOSCOPY (EGD) WITH PROPOFOL N/A 04/01/2018   Procedure: ESOPHAGOGASTRODUODENOSCOPY (EGD) WITH PROPOFOL;  Surgeon: Rogene Houston, MD;  [facility];  Service: Endoscopy;  Laterality: N/A;  . HARDWARE REMOVAL N/A 04/24/2014   Procedure: REMOVAL OF SACRAL INSTRUMENTATION WITH  [place];  Surgeon: Kristeen Miss, MD;  Location: [facility];  Service: Neurosurgery;  Laterality: N/A;  . HEMORRHOIDECTOMY WITH HEMORRHOID BANDING    . KNEE ARTHROSCOPY Right   . KNEE ARTHROSCOPY WITH MEDIAL MENISECTOMY Right 08/03/2018   Procedure: RIGHT KNEE ARTHROSCOPY WITH PARTIAL MEDIAL MENISCECTOMY;  Surgeon: Leandrew Koyanagi, MD;  Location: [facility];  Service: Orthopedics;  Laterality: Right;  . KNEE SURGERY     left  . LASIK    . POLYPECTOMY  08/13/2016   Procedure: POLYPECTOMY;  Surgeon: Rogene Houston, MD;  [facility];  Service: Endoscopy;;  colon  . PVI  01/24/10   afib ablation    Social History   Socioeconomic History  . Marital status: Married    Spouse name: Not on file  . Number of children: Not on file  . Years of education: Not on file  . Highest education level: Not on file  Occupational History  . Occupation: retired  Tobacco Use  . Smoking status: Former Smoker    Packs/day: 0.25    Years: 3.00    Pack years: 0.75    Types: Cigarettes    Quit date: 03/30/1969    Years since quitting: 51.5  . Smokeless tobacco: Never Used  Vaping Use  . Vaping Use: Never used  Substance and Sexual Activity  . Alcohol use: Yes    Alcohol/week: 14.0 standard drinks    Types: 7 Glasses of wine, 7 Shots of liquor per week    Comment: wine nightly  . Drug use: No  . Sexual activity: Yes  Other Topics Concern  . Not on file  Social History Narrative  . Not on file   Social Determinants of Health   Financial Resource Strain: Not on file  Food Insecurity: Not on file  Transportation Needs: Not on file  Physical Activity: Not on file  Stress: Not on file  Social Connections: Not on file  Intimate Partner Violence: Not on file    Family History  Problem Relation Age of Onset  . Hypertension Mother   . Transient ischemic attack Mother   . Heart disease Father   . Diabetes Brother   . Healthy Daughter   . Colon cancer Sister   .  Hypertension Other   . Heart disease Other     Anti-infectives:  Anti-infectives (From admission, onward)   None      Current Outpatient Medications  Medication Sig Dispense Refill  . mirabegron ER (MYRBETRIQ) 25 MG TB24 tablet 1 po qday and increase to the 50mg  if not improved in 2 weeks. 28 tablet 0  . acetaminophen (TYLENOL) 650 MG CR tablet Take 1,300 mg by mouth once as needed for pain.    Marland Kitchen amLODipine (NORVASC) 10 MG tablet TAKE 1 TABLET BY MOUTH EVERY DAY 90 tablet 1  . apixaban (ELIQUIS) 5 MG TABS tablet TAKE 1 TABLET BY MOUTH TWICE A DAY. STOP PRADAXA 60 tablet 5  . diltiazem (CARDIZEM) 30 MG tablet Take 1/2 tablet every 4 hours AS NEEDED for afib 45 tablet 1  . diphenhydrAMINE-APAP, sleep, (TYLENOL PM EXTRA STRENGTH PO) Take 1 tablet by mouth at bedtime.    . indapamide (LOZOL) 2.5 MG tablet TAKE 1 TABLET BY MOUTH EVERY DAY 90 tablet 1  . latanoprost (XALATAN) 0.005 % ophthalmic solution Place 1 drop into both eyes at bedtime.    Marland Kitchen MEDROL 4 MG tablet Take by mouth.    . Oxycodone HCl 10 MG TABS Take one tablet po 4 to 5 times per day 130 tablet 0  . Oxycodone HCl 10 MG TABS One qid prn pain 130 tablet 0  . Oxycodone HCl 10 MG TABS Take 1 tablet po 4 to 5 times per day 130 tablet 0  . pantoprazole (PROTONIX) 40 MG tablet Take 1 tablet (40 mg total) by mouth daily before breakfast. 90 tablet 4  . pravastatin (PRAVACHOL) 80 MG tablet TAKE 1 TABLET BY MOUTH EVERYDAY AT BEDTIME 90 tablet 1  . ramipril (ALTACE) 10 MG capsule TAKE 1 CAPSULE (10 MG TOTAL) BY MOUTH 2 (TWO) TIMES DAILY. 180 capsule 1   No current facility-administered medications for this visit.     Objective: Vital signs in last 24 hours: BP 131/82   Pulse 75   Ht 5\' 6"  (1.676 m)   Wt 183 lb (83 kg)   BMI 29.54 kg/m   Intake/Output from previous day: No intake/output data recorded. Intake/Output this shift: @IOTHISSHIFT @   Physical Exam Genitourinary:    Comments: He had a rectal exam this morning by  Dr. Wolfgang Phoenix.  It was reported as normal but the note isn't finished yet.     Lab Results:  Results for orders placed or performed in visit on 10/17/20 (from the past 24 hour(s))  Urinalysis, Routine w reflex microscopic     Status: None   Collection Time: 10/17/20  4:28 PM  Result Value Ref Range   Specific Gravity, UA 1.025 1.005 - 1.030   pH, UA 6.5 5.0 - 7.5   Color, UA Yellow Yellow   Appearance Ur Clear Clear   Leukocytes,UA Negative Negative   Protein,UA Negative Negative/Trace   Glucose, UA Negative Negative   Ketones, UA Negative Negative   RBC, UA Negative Negative   Bilirubin, UA Negative Negative   Urobilinogen, Ur 0.2 0.2 - 1.0 mg/dL   Nitrite, UA Negative Negative   Microscopic Examination Comment    Narrative   Performed at:  [facility] [place], [place]  [id number] Lab Director: Mina Marble MT, Phone:  [phone]    BMET Recent Labs    10/17/20 1202  NA 136  K 4.6  CL 97  CO2 22  GLUCOSE 95  BUN 23  CREATININE 1.04  CALCIUM 9.5   PT/INR No results for input(s): LABPROT, INR in the last  72 hours. ABG No results for input(s): PHART, HCO3 in the last 72 hours.  Invalid input(s): PCO2, PO2  Studies/Results: CT stone study films and report reviewed.    Assessment/Plan: OAB wet with a history of BPH with BOO but minimal obstructive symptoms.   I am going to try him on Myrbetriq and gave samples and reviewed the side effects.   Meds ordered this encounter  Medications  . mirabegron ER (MYRBETRIQ) 25 MG TB24 tablet    Sig: 1 po qday and increase to the 50mg  if not improved in 2 weeks.    Dispense:  28 tablet    Refill:  0    Given both 25mg  and 50mg  samples 28 of each.     Orders Placed This Encounter  Procedures  . Urinalysis, Routine w reflex microscopic  . BLADDER SCAN AMB NON-IMAGING     Return in about 4 weeks (around 11/14/2020) for 4-6 weeks for a flowrate and PVR. Marland Kitchen    CC: Dr. [name].       Irine Seal 10/18/2020 [phone] ID: [name], male   DOB: [name] 18, 1940, 80 y.o.   MRN: [id number]

## 2020-10-18 DIAGNOSIS — H401132 Primary open-angle glaucoma, bilateral, moderate stage: Secondary | ICD-10-CM | POA: Diagnosis not present

## 2020-10-18 DIAGNOSIS — H16223 Keratoconjunctivitis sicca, not specified as Sjogren's, bilateral: Secondary | ICD-10-CM | POA: Diagnosis not present

## 2020-10-18 DIAGNOSIS — H2512 Age-related nuclear cataract, left eye: Secondary | ICD-10-CM | POA: Diagnosis not present

## 2020-10-18 LAB — COMPREHENSIVE METABOLIC PANEL
ALT: 24 IU/L (ref 0–44)
AST: 26 IU/L (ref 0–40)
Albumin/Globulin Ratio: 2 (ref 1.2–2.2)
Albumin: 4.7 g/dL (ref 3.7–4.7)
Alkaline Phosphatase: 78 IU/L (ref 44–121)
BUN/Creatinine Ratio: 22 (ref 10–24)
BUN: 23 mg/dL (ref 8–27)
Bilirubin Total: 0.5 mg/dL (ref 0.0–1.2)
CO2: 22 mmol/L (ref 20–29)
Calcium: 9.5 mg/dL (ref 8.6–10.2)
Chloride: 97 mmol/L (ref 96–106)
Creatinine, Ser: 1.04 mg/dL (ref 0.76–1.27)
Globulin, Total: 2.4 g/dL (ref 1.5–4.5)
Glucose: 95 mg/dL (ref 65–99)
Potassium: 4.6 mmol/L (ref 3.5–5.2)
Sodium: 136 mmol/L (ref 134–144)
Total Protein: 7.1 g/dL (ref 6.0–8.5)
eGFR: 73 mL/min/{1.73_m2} (ref >59)

## 2020-10-18 LAB — CBC WITH DIFFERENTIAL/PLATELET
Basophils Absolute: 0.1 10*3/uL (ref 0.0–0.2)
Basos: 1 %
EOS (ABSOLUTE): 0.2 10*3/uL (ref 0.0–0.4)
Eos: 3 %
Hematocrit: 43.2 % (ref 37.5–51.0)
Hemoglobin: 14.5 g/dL (ref 13.0–17.7)
Immature Grans (Abs): 0 10*3/uL (ref 0.0–0.1)
Immature Granulocytes: 0 %
Lymphocytes Absolute: 1.2 10*3/uL (ref 0.7–3.1)
Lymphs: 18 %
MCH: 31.3 pg (ref 26.6–33.0)
MCHC: 33.6 g/dL (ref 31.5–35.7)
MCV: 93 fL (ref 79–97)
Monocytes Absolute: 0.8 10*3/uL (ref 0.1–0.9)
Monocytes: 12 %
Neutrophils Absolute: 4.5 10*3/uL (ref 1.4–7.0)
Neutrophils: 66 %
Platelets: 293 10*3/uL (ref 150–450)
RBC: 4.64 x10E6/uL (ref 4.14–5.80)
RDW: 12.9 % (ref 11.6–15.4)
WBC: 6.8 10*3/uL (ref 3.4–10.8)

## 2020-10-18 LAB — PSA: Prostate Specific Ag, Serum: 2.6 ng/mL (ref 0.0–4.0)

## 2020-10-21 ENCOUNTER — Other Ambulatory Visit: Payer: Self-pay

## 2020-10-22 ENCOUNTER — Encounter (INDEPENDENT_AMBULATORY_CARE_PROVIDER_SITE_OTHER): Payer: Self-pay | Admitting: *Deleted

## 2020-11-06 ENCOUNTER — Encounter: Payer: Self-pay | Admitting: Family Medicine

## 2020-11-06 ENCOUNTER — Telehealth: Payer: Self-pay | Admitting: Family Medicine

## 2020-11-06 ENCOUNTER — Ambulatory Visit (INDEPENDENT_AMBULATORY_CARE_PROVIDER_SITE_OTHER): Payer: PPO | Admitting: Family Medicine

## 2020-11-06 ENCOUNTER — Other Ambulatory Visit: Payer: Self-pay

## 2020-11-06 VITALS — BP 139/80 | HR 83 | Temp 98.6°F | Ht 66.0 in | Wt 179.0 lb

## 2020-11-06 DIAGNOSIS — K6289 Other specified diseases of anus and rectum: Secondary | ICD-10-CM | POA: Diagnosis not present

## 2020-11-06 DIAGNOSIS — R1030 Lower abdominal pain, unspecified: Secondary | ICD-10-CM | POA: Diagnosis not present

## 2020-11-06 DIAGNOSIS — K5900 Constipation, unspecified: Secondary | ICD-10-CM

## 2020-11-06 NOTE — Telephone Encounter (Signed)
If patient is having significant soreness I could see him today at 4:30 PM please let the patient know that if his exam reveals any worrisome findings we may have to send him to the ER for further test

## 2020-11-06 NOTE — Telephone Encounter (Signed)
Patient called in states he was woke up this morning at 4 am with his colon hurting him. He doesn't get to see gastroenterologist for 2 months. He would like advise on what to do.  CB# [phone]

## 2020-11-06 NOTE — Telephone Encounter (Signed)
Patient scheduled office visit today at 4:30pm with Dr Nicki Reaper- Patient states he is still having pain in his colon

## 2020-11-06 NOTE — Progress Notes (Signed)
   Subjective:    Patient ID: [name], male    DOB: August 07, 1940, 80 y.o.   MRN: [id number]  HPI Lower abdominal pain. Started end of may. Double over in pain this am. Woke from sleep.  Patient with severe lower abdominal pain woke him up from sleep.  Lower abdomen left and right side radiated to his back.  Some nausea with it no vomiting.  Has had difficult time with bowel movements over the past couple weeks.  We have been trying stool softeners he has an appointment with gastroenterology but it several weeks away denies bloody stools denies high fever chills  Review of Systems     Objective:   Physical Exam  Lungs clear heart regular abdomen is soft no guarding or rebound extremities no edema skin warm dry      Assessment & Plan:  Severe abdominal pain that woke him up from sleep it is recommended to go forward with CT scan abdomen pelvis with contrast.  Possibly will need consultation with GI sooner await the results of the CAT scan certainly if worsening condition go to ER immediately

## 2020-11-07 ENCOUNTER — Other Ambulatory Visit: Payer: Self-pay

## 2020-11-07 DIAGNOSIS — K5900 Constipation, unspecified: Secondary | ICD-10-CM

## 2020-11-07 DIAGNOSIS — K6289 Other specified diseases of anus and rectum: Secondary | ICD-10-CM

## 2020-11-07 DIAGNOSIS — R1030 Lower abdominal pain, unspecified: Secondary | ICD-10-CM

## 2020-11-08 ENCOUNTER — Other Ambulatory Visit: Payer: Self-pay

## 2020-11-08 ENCOUNTER — Ambulatory Visit (HOSPITAL_COMMUNITY)
Admission: RE | Admit: 2020-11-08 | Discharge: 2020-11-08 | Disposition: A | Discharge status: Home / Self Care | Payer: PPO | Source: Ambulatory Visit | Attending: Family Medicine | Admitting: Family Medicine

## 2020-11-08 DIAGNOSIS — K5900 Constipation, unspecified: Secondary | ICD-10-CM | POA: Insufficient documentation

## 2020-11-08 DIAGNOSIS — R1030 Lower abdominal pain, unspecified: Secondary | ICD-10-CM | POA: Insufficient documentation

## 2020-11-08 DIAGNOSIS — K6289 Other specified diseases of anus and rectum: Secondary | ICD-10-CM | POA: Diagnosis not present

## 2020-11-08 DIAGNOSIS — R109 Unspecified abdominal pain: Secondary | ICD-10-CM | POA: Diagnosis not present

## 2020-11-08 MED ORDER — IOHEXOL 300 MG/ML  SOLN
100.0000 mL | Freq: Once | INTRAMUSCULAR | Status: AC | PRN
  Administered 2020-11-08: 100 mL via INTRAVENOUS

## 2020-11-11 ENCOUNTER — Other Ambulatory Visit: Payer: Self-pay | Admitting: Family Medicine

## 2020-11-11 MED ORDER — METRONIDAZOLE 500 MG PO TABS: ORAL_TABLET | ORAL | 0 refills | Status: DC

## 2020-11-11 MED ORDER — CIPROFLOXACIN HCL 500 MG PO TABS: ORAL_TABLET | ORAL | 0 refills | Status: DC

## 2020-11-11 NOTE — Progress Notes (Addendum)
Subjective:   [name] is a 80 y.o. male who presents for an Initial Medicare Annual Wellness Visit.  I connected with [name] today by telephone and verified that I am speaking with the correct person using two identifiers. Location patient: home Location provider: work Persons participating in the virtual visit: patient, provider.   I discussed the limitations, risks, security and privacy concerns of performing an evaluation and management service by telephone and the availability of in person appointments. I also discussed with the patient that there may be a patient responsible charge related to this service. The patient expressed understanding and verbally consented to this telephonic visit.    Interactive audio and video telecommunications were attempted between this provider and patient, however failed, due to patient having technical difficulties OR patient did not have access to video capability.  We continued and completed visit with audio only.     Review of Systems    N/A  Cardiac Risk Factors include: advanced age (>44men, >74 women);hypertension;male gender;dyslipidemia     Objective:    Today's Vitals   There is no height or weight on file to calculate BMI.  Advanced Directives 11/12/2020 09/27/2019 09/24/2019 08/03/2018 07/27/2018 04/01/2018 03/30/2018  Does Patient Have a Medical Advance Directive? Yes Yes Yes Yes No No No  Type of Paramedic of [place];Living will - - Living will - - -  Does patient want to make changes to medical advance directive? No - Patient declined - - - - - -  Copy of [place] in Chart? No - copy requested - - - - - -  Would patient like information on creating a medical advance directive? - - - - - No - Patient declined No - Patient declined  Pre-existing out of facility DNR order (yellow form or pink MOST form) - - - - - - -    Current Medications (verified) Outpatient Encounter  Medications as of 11/12/2020  Medication Sig   acetaminophen (TYLENOL) 650 MG CR tablet Take 1,300 mg by mouth once as needed for pain.   amLODipine (NORVASC) 10 MG tablet TAKE 1 TABLET BY MOUTH EVERY DAY   apixaban (ELIQUIS) 5 MG TABS tablet TAKE 1 TABLET BY MOUTH TWICE A DAY. STOP PRADAXA   ciprofloxacin (CIPRO) 500 MG tablet Take one tablet po BID for 10 days. Take with snack and water.   diphenhydrAMINE-APAP, sleep, (TYLENOL PM EXTRA STRENGTH PO) Take 1 tablet by mouth at bedtime.   indapamide (LOZOL) 2.5 MG tablet TAKE 1 TABLET BY MOUTH EVERY DAY   latanoprost (XALATAN) 0.005 % ophthalmic solution Place 1 drop into both eyes at bedtime.   metroNIDAZOLE (FLAGYL) 500 MG tablet Take one tablet po TID for 7 days. Take with snack and water.   mirabegron ER (MYRBETRIQ) 25 MG TB24 tablet 1 po qday and increase to the 50mg  if not improved in 2 weeks.   Oxycodone HCl 10 MG TABS Take one tablet po 4 to 5 times per day   Oxycodone HCl 10 MG TABS One qid prn pain   Oxycodone HCl 10 MG TABS Take 1 tablet po 4 to 5 times per day   pantoprazole (PROTONIX) 40 MG tablet Take 1 tablet (40 mg total) by mouth daily before breakfast.   pravastatin (PRAVACHOL) 80 MG tablet TAKE 1 TABLET BY MOUTH EVERYDAY AT BEDTIME   ramipril (ALTACE) 10 MG capsule TAKE 1 CAPSULE (10 MG TOTAL) BY MOUTH 2 (TWO) TIMES DAILY.   diltiazem (  CARDIZEM) 30 MG tablet Take 1/2 tablet every 4 hours AS NEEDED for afib (Patient not taking: Reported on 11/12/2020)   MEDROL 4 MG tablet Take by mouth. (Patient not taking: Reported on 11/12/2020)   No facility-administered encounter medications on file as of 11/12/2020.    Allergies (verified) Sectral [acebutolol hcl], Sulfonamide derivatives, and Zocor [simvastatin]   History: Past Medical History:  Diagnosis Date   Arthritis    Cancer ([place])    skin   Carpal tunnel syndrome    Cataract    left eye   Chronic back pain    multiple back surgeries   Complication of anesthesia    pt  states woke up during hemorroid surgery   Constipation    Metamucil every mornind   DVT (deep venous thrombosis) ([place]) 03/2007   left leg    GERD (gastroesophageal reflux disease)    takes Protonix daily   Glaucoma    History of colon polyps    History of hyperglycemia    History of MRSA infection 1992   HTN (hypertension)    takes  Ramipril daily   Hyperlipidemia    takes Pravastatin 3 times a week   Joint pain    Joint swelling    Nocturia    Paroxysmal atrial fibrillation ([facility])    takes Pradaxa daily, Chads2vasc score 2(07/11/14)   Pneumonia    many yrs ago   Pulmonary embolism ([place]) 2008   Urinary frequency    Urinary urgency    Past Surgical History:  Procedure Laterality Date   BIOPSY  04/01/2018   Procedure: BIOPSY;  Surgeon: Rogene Houston, MD;  [facility];  Service: Endoscopy;;  gastric   CARDIAC CATHETERIZATION  2008   CARPAL TUNNEL RELEASE Right    cataract surgery Right    cervical and lumbar surgery     COLONOSCOPY  04/24/2011   Procedure: COLONOSCOPY;  Surgeon: Rogene Houston, MD;  [facility];  Service: Endoscopy;  Laterality: N/A;  1200   COLONOSCOPY N/A 08/13/2016   Procedure: COLONOSCOPY;  Surgeon: Rogene Houston, MD;  [facility];  Service: Endoscopy;  Laterality: N/A;  1200   ESOPHAGOGASTRODUODENOSCOPY  03/10/2012   Procedure: ESOPHAGOGASTRODUODENOSCOPY (EGD);  Surgeon: Rogene Houston, MD;  [facility];  Service: Endoscopy;  Laterality: N/A;  325   ESOPHAGOGASTRODUODENOSCOPY (EGD) WITH PROPOFOL N/A 04/01/2018   Procedure: ESOPHAGOGASTRODUODENOSCOPY (EGD) WITH PROPOFOL;  Surgeon: Rogene Houston, MD;  [facility];  Service: Endoscopy;  Laterality: N/A;   HARDWARE REMOVAL N/A 04/24/2014   Procedure: REMOVAL OF SACRAL INSTRUMENTATION WITH [place];  Surgeon: Kristeen Miss, MD;  Location: [facility];  Service: Neurosurgery;  Laterality: N/A;   HEMORRHOIDECTOMY WITH HEMORRHOID BANDING     KNEE  ARTHROSCOPY Right    KNEE ARTHROSCOPY WITH MEDIAL MENISECTOMY Right 08/03/2018   Procedure: RIGHT KNEE ARTHROSCOPY WITH PARTIAL MEDIAL MENISCECTOMY;  Surgeon: Leandrew Koyanagi, MD;  Location: [facility];  Service: Orthopedics;  Laterality: Right;   KNEE SURGERY     left   LASIK     POLYPECTOMY  08/13/2016   Procedure: POLYPECTOMY;  Surgeon: Rogene Houston, MD;  [facility];  Service: Endoscopy;;  colon   PVI  01/24/10   afib ablation   Family History  Problem Relation Age of Onset   Hypertension Mother    Transient ischemic attack Mother    Heart disease Father    Diabetes Brother  Healthy Daughter    Colon cancer Sister    Hypertension Other    Heart disease Other    Social History   Socioeconomic History   Marital status: Married    Spouse name: Not on file   Number of children: Not on file   Years of education: Not on file   Highest education level: Not on file  Occupational History   Occupation: retired  Tobacco Use   Smoking status: Former    Packs/day: 0.25    Years: 3.00    Pack years: 0.75    Types: Cigarettes    Quit date: 03/30/1969    Years since quitting: 51.6   Smokeless tobacco: Never  Vaping Use   Vaping Use: Never used  Substance and Sexual Activity   Alcohol use: Yes    Alcohol/week: 14.0 standard drinks    Types: 7 Glasses of wine, 7 Shots of liquor per week    Comment: wine nightly   Drug use: No   Sexual activity: Yes  Other Topics Concern   Not on file  Social History Narrative   Not on file   Social Determinants of Health   Financial Resource Strain: Low Risk    Difficulty of Paying Living Expenses: Not hard at all  Food Insecurity: No Food Insecurity   Worried About Charity fundraiser in the Last Year: Never true   [place] in the Last Year: Never true  Transportation Needs: No Transportation Needs   Lack of Transportation (Medical): No   Lack of Transportation (Non-Medical): No  Physical Activity:  Inactive   Days of Exercise per Week: 0 days   Minutes of Exercise per Session: 0 min  Stress: No Stress Concern Present   Feeling of Stress : Not at all  Social Connections: Moderately Isolated   Frequency of Communication with Friends and Family: Once a week   Frequency of Social Gatherings with Friends and Family: More than three times a week   Attends Religious Services: Never   Marine scientist or Organizations: No   Attends Archivist Meetings: Never   Marital Status: Married    Tobacco Counseling Counseling given: Not Answered   Clinical Intake:  Pre-visit preparation completed: Yes  Pain : No/denies pain     Nutritional Risks: Other (Comment) (Constipation) Diabetes: No  How often do you need to have someone help you when you read instructions, pamphlets, or other written materials from your doctor or pharmacy?: 1 - Never  Diabetic?No   Interpreter Needed?: No  Information entered by :: [place] of Daily Living In your present state of health, do you have any difficulty performing the following activities: 11/12/2020  Hearing? N  Vision? N  Difficulty concentrating or making decisions? N  Walking or climbing stairs? N  Dressing or bathing? N  Doing errands, shopping? N  Preparing Food and eating ? N  Using the Toilet? N  In the past six months, have you accidently leaked urine? Y  Comment has some issues with urgency  Do you have problems with loss of bowel control? N  Managing your Medications? N  Managing your Finances? N  Housekeeping or managing your Housekeeping? N  Some recent data might be hidden    Patient Care Team: Kathyrn Drown, MD as PCP - General (Family Medicine)  Indicate any recent Medical Services you may have received from other than [facility] providers in the past year (date may be approximate).  Assessment:   This is a routine wellness examination for [facility]  Hearing/Vision screen Vision  Screening - Comments:: Patient states gets eyes examined once per year  Dietary issues and exercise activities discussed: Current Exercise Habits: The patient has a physically strenuous job, but has no regular exercise apart from work., Exercise limited by: cardiac condition(s)   Goals Addressed             This Visit's Progress    Prevent falls       Continue to use cane with ambulation        Depression Screen PHQ 2/9 Scores 11/12/2020 06/10/2020 10/13/2019 05/17/2017 06/07/2015 02/19/2015 02/19/2015  PHQ - 2 Score 0 0 2 0 0 0 0  PHQ- 9 Score - - 6 6 - - -    Fall Risk Fall Risk  11/12/2020 06/10/2020 08/29/2019 03/15/2019 03/07/2019  Falls in the past year? 0 0 0 0 0  Number falls in past yr: 0 - - - -  Injury with Fall? 0 - - - -  Risk for fall due to : Impaired balance/gait Impaired balance/gait - - -  Follow up - Falls evaluation completed - Falls evaluation completed Falls evaluation completed    FALL RISK PREVENTION PERTAINING TO THE HOME:  Any stairs in or around the home? Yes  If so, are there any without handrails? No  Home free of loose throw rugs in walkways, pet beds, electrical cords, etc? Yes  Adequate lighting in your home to reduce risk of falls? Yes   ASSISTIVE DEVICES UTILIZED TO PREVENT FALLS:  Life alert? No  Use of a cane, walker or w/c? Yes  Grab bars in the bathroom? No  Shower chair or bench in shower? No  Elevated toilet seat or a handicapped toilet? Yes     Cognitive Function:  Normal cognitive status assessed by direct observation by this Nurse Health Advisor. No abnormalities found.        Immunizations Immunization History  Administered Date(s) Administered   Fluad Quad(high Dose 65+) 03/07/2020   Influenza Split 03/22/2013   Influenza,inj,Quad PF,6+ Mos 03/16/2014, 02/19/2015, 02/13/2016, 02/16/2017, 01/31/2018, 02/18/2019   Influenza-Unspecified 01/17/2012   Moderna Sars-Covid-2 Vaccination 06/29/2019, 07/28/2019   Pneumococcal  Conjugate-13 03/16/2014   Pneumococcal Polysaccharide-23 02/21/2007   Td 12/28/2003   Tdap 01/26/2020   Zoster, Live 04/17/2008    TDAP status: Due, Education has been provided regarding the importance of this vaccine. Advised may receive this vaccine at local pharmacy or Health Dept. Aware to provide a copy of the vaccination record if obtained from local pharmacy or Health Dept. Verbalized acceptance and understanding.  Flu Vaccine status: Up to date  Pneumococcal vaccine status: Up to date  Covid-19 vaccine status: Completed vaccines  Qualifies for Shingles Vaccine? Yes   Zostavax completed Yes   Shingrix Completed?: No.    Education has been provided regarding the importance of this vaccine. Patient has been advised to call insurance company to determine out of pocket expense if they have not yet received this vaccine. Advised may also receive vaccine at local pharmacy or Health Dept. Verbalized acceptance and understanding.  Screening Tests Health Maintenance  Topic Date Due   Zoster Vaccines- Shingrix (1 of 2) Never done   COVID-19 Vaccine (3 - Moderna risk series) 08/25/2019   INFLUENZA VACCINE  12/16/2020   COLONOSCOPY (Pts 45-14yrs Insurance coverage will need to be confirmed)  08/13/2021   TETANUS/TDAP  01/25/2030   PNA vac Low Risk Adult  Completed  HPV VACCINES  Aged Out    Health Maintenance  Health Maintenance Due  Topic Date Due   Zoster Vaccines- Shingrix (1 of 2) Never done   COVID-19 Vaccine (3 - Moderna risk series) 08/25/2019    Colorectal cancer screening: No longer required.   Lung Cancer Screening: (Low Dose CT Chest recommended if Age 56-80 years, 30 pack-year currently smoking OR have quit w/in 15years.) does not qualify.   Lung Cancer Screening Referral: N/A   Additional Screening:  Hepatitis C Screening: does not qualify;   Vision Screening: Recommended annual ophthalmology exams for early detection of glaucoma and other disorders of the  eye. Is the patient up to date with their annual eye exam?  Yes  Who is the provider or what is the name of the office in which the patient attends annual eye exams? Dr. Nancy Fetter  If pt is not established with a provider, would they like to be referred to a provider to establish care? No .   Dental Screening: Recommended annual dental exams for proper oral hygiene  Community Resource Referral / Chronic Care Management: CRR required this visit?  No   CCM required this visit?  No      Plan:     I have personally reviewed and noted the following in the patient's chart:   Medical and social history Use of alcohol, tobacco or illicit drugs  Current medications and supplements including opioid prescriptions. Patient is currently taking opioid prescriptions. Information provided to patient regarding non-opioid alternatives. Patient advised to discuss non-opioid treatment plan with their provider. Functional ability and status Nutritional status Physical activity Advanced directives List of other physicians Hospitalizations, surgeries, and ER visits in previous 12 months Vitals Screenings to include cognitive, depression, and falls Referrals and appointments  In addition, I have reviewed and discussed with patient certain preventive protocols, quality metrics, and best practice recommendations. A written personalized care plan for preventive services as well as general preventive health recommendations were provided to patient.     Ofilia Neas, LPN   06/14/7865   Nurse Notes: None  I have reviewed and agree with the above annual wellness visit documentation [name] MD [facility]

## 2020-11-12 ENCOUNTER — Other Ambulatory Visit: Payer: Self-pay

## 2020-11-12 ENCOUNTER — Ambulatory Visit (INDEPENDENT_AMBULATORY_CARE_PROVIDER_SITE_OTHER): Payer: PPO

## 2020-11-12 DIAGNOSIS — Z0000 Encounter for general adult medical examination without abnormal findings: Secondary | ICD-10-CM | POA: Diagnosis not present

## 2020-11-12 NOTE — Patient Instructions (Signed)
[name] , Thank you for taking time to come for your Medicare Wellness Visit. I appreciate your ongoing commitment to your health goals. Please review the following plan we discussed and let me know if I can assist you in the future.   Screening recommendations/referrals: Colonoscopy: No longer required  Recommended yearly ophthalmology/optometry visit for glaucoma screening and checkup Recommended yearly dental visit for hygiene and checkup  Vaccinations: Influenza vaccine: Up to date, next due fall 2022  Pneumococcal vaccine: Completed series  Tdap vaccine: Up to date, next due 01/25/2030 Shingles vaccine: Currently due for Shingrix, if you would like to receive we recommend that you do so at your local pharmacy    Advanced directives: Please bring in copies of your advanced medical directives so that we may scan them into your chart.   Conditions/risks identified: None   Next appointment: None   Preventive Care 65 Years and Older, Male Preventive care refers to lifestyle choices and visits with your health care provider that can promote health and wellness. What does preventive care include? A yearly physical exam. This is also called an annual well check. Dental exams once or twice a year. Routine eye exams. Ask your health care provider how often you should have your eyes checked. Personal lifestyle choices, including: Daily care of your teeth and gums. Regular physical activity. Eating a healthy diet. Avoiding tobacco and drug use. Limiting alcohol use. Practicing safe sex. Taking low doses of aspirin every day. Taking vitamin and mineral supplements as recommended by your health care provider. What happens during an annual well check? The services and screenings done by your health care provider during your annual well check will depend on your age, overall health, lifestyle risk factors, and family history of disease. Counseling  Your health care provider may ask you  questions about your: Alcohol use. Tobacco use. Drug use. Emotional well-being. Home and relationship well-being. Sexual activity. Eating habits. History of falls. Memory and ability to understand (cognition). Work and work Statistician. Screening  You may have the following tests or measurements: Height, weight, and BMI. Blood pressure. Lipid and cholesterol levels. These may be checked every 5 years, or more frequently if you are over 73 years old. Skin check. Lung cancer screening. You may have this screening every year starting at age 80 if you have a 30-pack-year history of smoking and currently smoke or have quit within the past 15 years. Fecal occult blood test (FOBT) of the stool. You may have this test every year starting at age 80. Flexible sigmoidoscopy or colonoscopy. You may have a sigmoidoscopy every 5 years or a colonoscopy every 10 years starting at age 80. Prostate cancer screening. Recommendations will vary depending on your family history and other risks. Hepatitis C blood test. Hepatitis B blood test. Sexually transmitted disease (STD) testing. Diabetes screening. This is done by checking your blood sugar (glucose) after you have not eaten for a while (fasting). You may have this done every 1-3 years. Abdominal aortic aneurysm (AAA) screening. You may need this if you are a current or former smoker. Osteoporosis. You may be screened starting at age 56 if you are at high risk. Talk with your health care provider about your test results, treatment options, and if necessary, the need for more tests. Vaccines  Your health care provider may recommend certain vaccines, such as: Influenza vaccine. This is recommended every year. Tetanus, diphtheria, and acellular pertussis (Tdap, Td) vaccine. You may need a Td booster every 10 years.  Zoster vaccine. You may need this after age 52. Pneumococcal 13-valent conjugate (PCV13) vaccine. One dose is recommended after age  80. Pneumococcal polysaccharide (PPSV23) vaccine. One dose is recommended after age 61. Talk to your health care provider about which screenings and vaccines you need and how often you need them. This information is not intended to replace advice given to you by your health care provider. Make sure you discuss any questions you have with your health care provider. Document Released: 05/31/2015 Document Revised: 01/22/2016 Document Reviewed: 03/05/2015 Elsevier Interactive Patient Education  2017 [place] Prevention in the Home Falls can cause injuries. They can happen to people of all ages. There are many things you can do to make your home safe and to help prevent falls. What can I do on the outside of my home? Regularly fix the edges of walkways and driveways and fix any cracks. Remove anything that might make you trip as you walk through a door, such as a raised step or threshold. Trim any bushes or trees on the path to your home. Use bright outdoor lighting. Clear any walking paths of anything that might make someone trip, such as rocks or tools. Regularly check to see if handrails are loose or broken. Make sure that both sides of any steps have handrails. Any raised decks and porches should have guardrails on the edges. Have any leaves, snow, or ice cleared regularly. Use sand or salt on walking paths during winter. Clean up any spills in your garage right away. This includes oil or grease spills. What can I do in the bathroom? Use night lights. Install grab bars by the toilet and in the tub and shower. Do not use towel bars as grab bars. Use non-skid mats or decals in the tub or shower. If you need to sit down in the shower, use a plastic, non-slip stool. Keep the floor dry. Clean up any water that spills on the floor as soon as it happens. Remove soap buildup in the tub or shower regularly. Attach bath mats securely with double-sided non-slip rug tape. Do not have throw  rugs and other things on the floor that can make you trip. What can I do in the bedroom? Use night lights. Make sure that you have a light by your bed that is easy to reach. Do not use any sheets or blankets that are too big for your bed. They should not hang down onto the floor. Have a firm chair that has side arms. You can use this for support while you get dressed. Do not have throw rugs and other things on the floor that can make you trip. What can I do in the kitchen? Clean up any spills right away. Avoid walking on wet floors. Keep items that you use a lot in easy-to-reach places. If you need to reach something above you, use a strong step stool that has a grab bar. Keep electrical cords out of the way. Do not use floor polish or wax that makes floors slippery. If you must use wax, use non-skid floor wax. Do not have throw rugs and other things on the floor that can make you trip. What can I do with my stairs? Do not leave any items on the stairs. Make sure that there are handrails on both sides of the stairs and use them. Fix handrails that are broken or loose. Make sure that handrails are as long as the stairways. Check any carpeting to make sure that  it is firmly attached to the stairs. Fix any carpet that is loose or worn. Avoid having throw rugs at the top or bottom of the stairs. If you do have throw rugs, attach them to the floor with carpet tape. Make sure that you have a light switch at the top of the stairs and the bottom of the stairs. If you do not have them, ask someone to add them for you. What else can I do to help prevent falls? Wear shoes that: Do not have high heels. Have rubber bottoms. Are comfortable and fit you well. Are closed at the toe. Do not wear sandals. If you use a stepladder: Make sure that it is fully opened. Do not climb a closed stepladder. Make sure that both sides of the stepladder are locked into place. Ask someone to hold it for you, if  possible. Clearly mark and make sure that you can see: Any grab bars or handrails. First and last steps. Where the edge of each step is. Use tools that help you move around (mobility aids) if they are needed. These include: Canes. Walkers. Scooters. Crutches. Turn on the lights when you go into a dark area. Replace any light bulbs as soon as they burn out. Set up your furniture so you have a clear path. Avoid moving your furniture around. If any of your floors are uneven, fix them. If there are any pets around you, be aware of where they are. Review your medicines with your doctor. Some medicines can make you feel dizzy. This can increase your chance of falling. Ask your doctor what other things that you can do to help prevent falls. This information is not intended to replace advice given to you by your health care provider. Make sure you discuss any questions you have with your health care provider. Document Released: 02/28/2009 Document Revised: 10/10/2015 Document Reviewed: 06/08/2014 Elsevier Interactive Patient Education  2017 [employer].

## 2020-11-14 ENCOUNTER — Ambulatory Visit (INDEPENDENT_AMBULATORY_CARE_PROVIDER_SITE_OTHER): Payer: PPO | Admitting: Family Medicine

## 2020-11-14 ENCOUNTER — Encounter: Payer: Self-pay | Admitting: Family Medicine

## 2020-11-14 ENCOUNTER — Encounter: Payer: Self-pay | Admitting: Urology

## 2020-11-14 ENCOUNTER — Ambulatory Visit: Payer: PPO | Admitting: Urology

## 2020-11-14 ENCOUNTER — Other Ambulatory Visit: Payer: Self-pay

## 2020-11-14 VITALS — BP 135/78 | HR 68 | Temp 98.4°F | Wt 182.8 lb

## 2020-11-14 VITALS — BP 130/78 | HR 71 | Temp 98.1°F | Ht 66.0 in | Wt 182.0 lb

## 2020-11-14 DIAGNOSIS — N3941 Urge incontinence: Secondary | ICD-10-CM | POA: Diagnosis not present

## 2020-11-14 DIAGNOSIS — R3915 Urgency of urination: Secondary | ICD-10-CM | POA: Diagnosis not present

## 2020-11-14 DIAGNOSIS — N138 Other obstructive and reflux uropathy: Secondary | ICD-10-CM

## 2020-11-14 DIAGNOSIS — R351 Nocturia: Secondary | ICD-10-CM

## 2020-11-14 DIAGNOSIS — I700 Atherosclerosis of aorta: Secondary | ICD-10-CM | POA: Diagnosis not present

## 2020-11-14 DIAGNOSIS — N401 Enlarged prostate with lower urinary tract symptoms: Secondary | ICD-10-CM | POA: Diagnosis not present

## 2020-11-14 DIAGNOSIS — K5792 Diverticulitis of intestine, part unspecified, without perforation or abscess without bleeding: Secondary | ICD-10-CM

## 2020-11-14 LAB — BLADDER SCAN AMB NON-IMAGING: Scan Result: 66

## 2020-11-14 MED ORDER — MIRABEGRON ER 50 MG PO TB24: 50.0000 mg | ORAL_TABLET | Freq: Every day | ORAL | 11 refills | Status: DC

## 2020-11-14 NOTE — Progress Notes (Signed)
Urological Symptom Review  Patient is experiencing the following symptoms: N/a    Review of Systems  Gastrointestinal (upper)  : Negative for upper GI symptoms  Gastrointestinal (lower) : Negative for lower GI symptoms  Constitutional : Negative for symptoms  Skin: Negative for skin symptoms  Eyes: Negative for eye symptoms  Ear/Nose/Throat : Negative for Ear/Nose/Throat symptoms  Hematologic/Lymphatic: Easy bruising  Cardiovascular : Negative for cardiovascular symptoms  Respiratory : Negative for respiratory symptoms  Endocrine: Negative for endocrine symptoms  Musculoskeletal: Back pain Joint pain  Neurological: Negative for neurological symptoms  Psychologic: Negative for psychiatric symptoms

## 2020-11-14 NOTE — Progress Notes (Signed)
   Subjective:    Patient ID: [name], male    DOB: Sep 21, 1940, 80 y.o.   MRN: [id number]  HPI  Follow up abdominal pain and constipation  CT scan results CT scan results discussed with the patient Diverticulitis noted Patient feels that his symptoms are improving Having a hard time tolerating the medicines Denies bloody stools Aortic atherosclerosis on the scan Review of Systems     Objective:   Physical Exam General-in no acute distress Eyes-no discharge Lungs-respiratory rate normal, CTA CV-no murmurs,RRR Extremities skin warm dry no edema Neuro grossly normal Behavior normal, alert Abdomen soft no guarding or rebound       Assessment & Plan:  Diverticulitis doing well with medicine continue medicine.  Tuesday if doing well at that point may stop the medicine  Aortic atherosclerosis continue the pravastatin.  Watch diet closely  Follow-up by later in the fall

## 2020-11-14 NOTE — Progress Notes (Signed)
Subjective: 1. Enlarged prostate with urinary obstruction   2. Urgency of urination   3. Urge incontinence   4. Nocturia      11/14/20: [name] returns today in f/u to assess his response to Myrbetriq.   He thinks he is doing better with reduced frequency and urgency.  He has reduced UUI as well.   His PVR is stable at 46ml.   He has had some constipation and mild headache just today.  He had a CT last week and was found to have diverticulitis.  He is on Cipro and Metronidazole.  He has had side effects with the antibiotics.    [name] returns today in f/u.  He was last seen in 2018 for BPH with BOO with urethral bleeding that was associated with urethral distention.  He had been on finasteride but has been off for a while.  He currently reports urgency and has UUI.  He has nocturia x 2.  His last PSA was stable at 2.0 in 1/19.  Dr. Wolfgang Phoenix ordered one that was drawn today.  His PVR is 67ml.   He has had some recent constipation that is associated with chronic opioid for low back pain.  He had a CT stone study in 5/21 that showed no stones.    IPSS     Row Name 11/14/20 1300         International Prostate Symptom Score   How often have you had the sensation of not emptying your bladder? Not at All     How often have you had to urinate less than every two hours? More than half the time     How often have you found you stopped and started again several times when you urinated? More than half the time     How often have you found it difficult to postpone urination? More than half the time     How often have you had a weak urinary stream? Not at All     How often have you had to strain to start urination? Not at All     How many times did you typically get up at night to urinate? 3 Times     Total IPSS Score 15           Quality of Life due to urinary symptoms     If you were to spend the rest of your life with your urinary condition just the way it is now how would you feel about that?  Mixed             ROS:  ROS  Allergies  Allergen Reactions   Sectral [Acebutolol Hcl] Other (See Comments)    Side effects   Sulfonamide Derivatives Other (See Comments)    Runs blood pressure up   Zocor [Simvastatin] Other (See Comments)    Joint pain    Past Medical History:  Diagnosis Date   Arthritis    Cancer ([place])    skin   Carpal tunnel syndrome    Cataract    left eye   Chronic back pain    multiple back surgeries   Complication of anesthesia    pt states woke up during hemorroid surgery   Constipation    Metamucil every mornind   DVT (deep venous thrombosis) ([place]) 03/2007   left leg    GERD (gastroesophageal reflux disease)    takes Protonix daily   Glaucoma    History of colon polyps    History  of hyperglycemia    History of MRSA infection 1992   HTN (hypertension)    takes  Ramipril daily   Hyperlipidemia    takes Pravastatin 3 times a week   Joint pain    Joint swelling    Nocturia    Paroxysmal atrial fibrillation ([facility])    takes Pradaxa daily, Chads2vasc score 2(07/11/14)   Pneumonia    many yrs ago   Pulmonary embolism ([place]) 2008   Urinary frequency    Urinary urgency     Past Surgical History:  Procedure Laterality Date   BIOPSY  04/01/2018   Procedure: BIOPSY;  Surgeon: Rogene Houston, MD;  [facility];  Service: Endoscopy;;  gastric   CARDIAC CATHETERIZATION  2008   CARPAL TUNNEL RELEASE Right    cataract surgery Right    cervical and lumbar surgery     COLONOSCOPY  04/24/2011   Procedure: COLONOSCOPY;  Surgeon: Rogene Houston, MD;  [facility];  Service: Endoscopy;  Laterality: N/A;  1200   COLONOSCOPY N/A 08/13/2016   Procedure: COLONOSCOPY;  Surgeon: Rogene Houston, MD;  [facility];  Service: Endoscopy;  Laterality: N/A;  1200   ESOPHAGOGASTRODUODENOSCOPY  03/10/2012   Procedure: ESOPHAGOGASTRODUODENOSCOPY (EGD);  Surgeon: Rogene Houston, MD;  [facility];  Service:  Endoscopy;  Laterality: N/A;  325   ESOPHAGOGASTRODUODENOSCOPY (EGD) WITH PROPOFOL N/A 04/01/2018   Procedure: ESOPHAGOGASTRODUODENOSCOPY (EGD) WITH PROPOFOL;  Surgeon: Rogene Houston, MD;  [facility];  Service: Endoscopy;  Laterality: N/A;   HARDWARE REMOVAL N/A 04/24/2014   Procedure: REMOVAL OF SACRAL INSTRUMENTATION WITH [place];  Surgeon: Kristeen Miss, MD;  Location: [facility];  Service: Neurosurgery;  Laterality: N/A;   HEMORRHOIDECTOMY WITH HEMORRHOID BANDING     KNEE ARTHROSCOPY Right    KNEE ARTHROSCOPY WITH MEDIAL MENISECTOMY Right 08/03/2018   Procedure: RIGHT KNEE ARTHROSCOPY WITH PARTIAL MEDIAL MENISCECTOMY;  Surgeon: Leandrew Koyanagi, MD;  Location: [place];  Service: Orthopedics;  Laterality: Right;   KNEE SURGERY     left   LASIK     POLYPECTOMY  08/13/2016   Procedure: POLYPECTOMY;  Surgeon: Rogene Houston, MD;  [facility];  Service: Endoscopy;;  colon   PVI  01/24/10   afib ablation    Social History   Socioeconomic History   Marital status: Married    Spouse name: Not on file   Number of children: Not on file   Years of education: Not on file   Highest education level: Not on file  Occupational History   Occupation: retired  Tobacco Use   Smoking status: Former    Packs/day: 0.25    Years: 3.00    Pack years: 0.75    Types: Cigarettes    Quit date: 03/30/1969    Years since quitting: 51.6   Smokeless tobacco: Never  Vaping Use   Vaping Use: Never used  Substance and Sexual Activity   Alcohol use: Yes    Alcohol/week: 14.0 standard drinks    Types: 7 Glasses of wine, 7 Shots of liquor per week    Comment: wine nightly   Drug use: No   Sexual activity: Yes  Other Topics Concern   Not on file  Social History Narrative   Not on file   Social Determinants of Health   Financial Resource Strain: Low Risk    Difficulty of Paying Living Expenses: Not hard at all  Food Insecurity: No  Food Insecurity   Worried  About Charity fundraiser in the Last Year: Never true   Ran Out of Food in the Last Year: Never true  Transportation Needs: No Transportation Needs   Lack of Transportation (Medical): No   Lack of Transportation (Non-Medical): No  Physical Activity: Inactive   Days of Exercise per Week: 0 days   Minutes of Exercise per Session: 0 min  Stress: No Stress Concern Present   Feeling of Stress : Not at all  Social Connections: Moderately Isolated   Frequency of Communication with Friends and Family: Once a week   Frequency of Social Gatherings with Friends and Family: More than three times a week   Attends Religious Services: Never   Marine scientist or Organizations: No   Attends Music therapist: Never   Marital Status: Married  Human resources officer Violence: Not At Risk   Fear of Current or Ex-Partner: No   Emotionally Abused: No   Physically Abused: No   Sexually Abused: No    Family History  Problem Relation Age of Onset   Hypertension Mother    Transient ischemic attack Mother    Heart disease Father    Diabetes Brother    Healthy Daughter    Colon cancer Sister    Hypertension Other    Heart disease Other     Anti-infectives: Anti-infectives (From admission, onward)    None       Current Outpatient Medications  Medication Sig Dispense Refill   mirabegron ER (MYRBETRIQ) 50 MG TB24 tablet Take 1 tablet (50 mg total) by mouth daily. 30 tablet 11   acetaminophen (TYLENOL) 650 MG CR tablet Take 1,300 mg by mouth once as needed for pain.     amLODipine (NORVASC) 10 MG tablet TAKE 1 TABLET BY MOUTH EVERY DAY 90 tablet 1   apixaban (ELIQUIS) 5 MG TABS tablet TAKE 1 TABLET BY MOUTH TWICE A DAY. STOP PRADAXA 60 tablet 5   ciprofloxacin (CIPRO) 500 MG tablet Take one tablet po BID for 10 days. Take with snack and water. 20 tablet 0   diltiazem (CARDIZEM) 30 MG tablet Take 1/2 tablet every 4 hours AS NEEDED for afib (Patient not taking: No sig reported) 45  tablet 1   diphenhydrAMINE-APAP, sleep, (TYLENOL PM EXTRA STRENGTH PO) Take 1 tablet by mouth at bedtime.     indapamide (LOZOL) 2.5 MG tablet TAKE 1 TABLET BY MOUTH EVERY DAY 90 tablet 1   latanoprost (XALATAN) 0.005 % ophthalmic solution Place 1 drop into both eyes at bedtime.     MEDROL 4 MG tablet Take by mouth. (Patient not taking: Reported on 11/12/2020)     metroNIDAZOLE (FLAGYL) 500 MG tablet Take one tablet po TID for 7 days. Take with snack and water. 21 tablet 0   Oxycodone HCl 10 MG TABS Take one tablet po 4 to 5 times per day 130 tablet 0   Oxycodone HCl 10 MG TABS One qid prn pain 130 tablet 0   Oxycodone HCl 10 MG TABS Take 1 tablet po 4 to 5 times per day 130 tablet 0   pantoprazole (PROTONIX) 40 MG tablet Take 1 tablet (40 mg total) by mouth daily before breakfast. 90 tablet 4   pravastatin (PRAVACHOL) 80 MG tablet TAKE 1 TABLET BY MOUTH EVERYDAY AT BEDTIME 90 tablet 1   ramipril (ALTACE) 10 MG capsule TAKE 1 CAPSULE (10 MG TOTAL) BY MOUTH 2 (TWO) TIMES DAILY. 180 capsule 1   No current  facility-administered medications for this visit.     Objective: Vital signs in last 24 hours: BP 135/78   Pulse 68   Temp 98.4 F (36.9 C)   Wt 182 lb 12.8 oz (82.9 kg)   BMI 29.50 kg/m   Intake/Output from previous day: No intake/output data recorded. Intake/Output this shift: @IOTHISSHIFT @   Physical Exam Genitourinary:    Comments: He had a rectal exam this morning by Dr. Wolfgang Phoenix.  It was reported as normal but the note isn't finished yet.    Lab Results:  No results found for this or any previous visit (from the past 24 hour(s)).   BMET No results for input(s): NA, K, CL, CO2, GLUCOSE, BUN, CREATININE, CALCIUM in the last 72 hours.  PT/INR No results for input(s): LABPROT, INR in the last 72 hours. ABG No results for input(s): PHART, HCO3 in the last 72 hours.  Invalid input(s): PCO2, PO2  Studies/Results: CT stone study films and report reviewed.     Assessment/Plan: OAB wet with a history of BPH with BOO but minimal obstructive symptoms.   He is improving with the 50mg  Myrbetriq so I will send a script.    Meds ordered this encounter  Medications   mirabegron ER (MYRBETRIQ) 50 MG TB24 tablet    Sig: Take 1 tablet (50 mg total) by mouth daily.    Dispense:  30 tablet    Refill:  11     Orders Placed This Encounter  Procedures   Urinalysis, Routine w reflex microscopic   PR COMPLEX UROFLOWMETRY   BLADDER SCAN AMB NON-IMAGING     Return in about 4 months (around 03/16/2021).    CC: Dr. [name].      Irine Seal 11/14/2020 [phone] ID: [name], male   DOB: 1940-07-25, 80 y.o.   MRN: [id number]

## 2020-11-14 NOTE — Patient Instructions (Addendum)
If doing much better July 5th then may stop medicine (antibiotics)  Let us know if problems  Follow up July 25 Monday at 10 am

## 2020-11-14 NOTE — Progress Notes (Signed)
post void residual= 66 Patient unable to void

## 2020-11-28 DIAGNOSIS — X32XXXD Exposure to sunlight, subsequent encounter: Secondary | ICD-10-CM | POA: Diagnosis not present

## 2020-11-28 DIAGNOSIS — C44519 Basal cell carcinoma of skin of other part of trunk: Secondary | ICD-10-CM | POA: Diagnosis not present

## 2020-11-28 DIAGNOSIS — C44311 Basal cell carcinoma of skin of nose: Secondary | ICD-10-CM | POA: Diagnosis not present

## 2020-11-28 DIAGNOSIS — L570 Actinic keratosis: Secondary | ICD-10-CM | POA: Diagnosis not present

## 2020-11-28 DIAGNOSIS — Z85828 Personal history of other malignant neoplasm of skin: Secondary | ICD-10-CM | POA: Diagnosis not present

## 2020-11-28 DIAGNOSIS — Z08 Encounter for follow-up examination after completed treatment for malignant neoplasm: Secondary | ICD-10-CM | POA: Diagnosis not present

## 2020-12-09 ENCOUNTER — Ambulatory Visit (INDEPENDENT_AMBULATORY_CARE_PROVIDER_SITE_OTHER): Payer: PPO | Admitting: Family Medicine

## 2020-12-09 ENCOUNTER — Other Ambulatory Visit: Payer: Self-pay

## 2020-12-09 VITALS — BP 130/68 | Temp 92.8°F | Wt 180.4 lb

## 2020-12-09 DIAGNOSIS — Z79891 Long term (current) use of opiate analgesic: Secondary | ICD-10-CM

## 2020-12-09 MED ORDER — OXYCODONE HCL 10 MG PO TABS: ORAL_TABLET | ORAL | 0 refills | Status: DC

## 2020-12-09 NOTE — Progress Notes (Signed)
   Subjective:    Patient ID: [name], male    DOB: [birth year], 80 y.o.   MRN: [id number]  HPI This patient was seen today for chronic pain  The medication list was reviewed and updated.  Location of Pain for which the patient has been treated with regarding narcotics: Neck pain and low back pain multiple surgeries in the past  Onset of this pain: Present for greater than 15 years   -Compliance with medication: Oxycodone 10 mg  - Number patient states they take daily: 4  -when was the last dose patient took? Right before visit   The patient was advised the importance of maintaining medication and not using illegal substances with these.  Here for refills and follow up  The patient was educated that we can provide 3 monthly scripts for their medication, it is their responsibility to follow the instructions.  Side effects or complications from medications: none  Patient is aware that pain medications are meant to minimize the severity of the pain to allow their pain levels to improve to allow for better function. They are aware of that pain medications cannot totally remove their pain.  Due for UDT ( at least once per year) : will try to obtain today   Scale of 1 to 10 ( 1 is least 10 is most) Your pain level without the medicine: 8 Your pain level with medication: 2 (sitting still)  Scale 1 to 10 ( 1-helps very little, 10 helps very well) How well does your pain medication reduce your pain so you can function better through out the day? 7  Quality of the pain: Throbbing burning pain  Persistence of the pain: Present all the time  Modifying factors: Worse with activity        Review of Systems     Objective:   Physical Exam  General-in no acute distress Eyes-no discharge Lungs-respiratory rate normal, CTA CV-no murmurs,RRR Extremities skin warm dry no edema Neuro grossly normal Behavior normal, alert       Assessment & Plan:  1. Encounter for  long-term use of opiate analgesic The patient was seen in followup for chronic pain. A review over at their current pain status was discussed. Drug registry was checked. Prescriptions were given.  Regular follow-up recommended. Discussion was held regarding the importance of compliance with medication as well as pain medication contract.  Patient was informed that medication may cause drowsiness and should not be combined  with other medications/alcohol or street drugs. If the patient feels medication is causing altered alertness then do not drive or operate dangerous equipment.  - ToxASSURE Select 13 (MW), Urine  Drug registry checked 3 scripts sent in 126 tablets Takes 4 or 5/day Will follow-up in 3 months Will also try Senokot for the constipation if that does not help we will notify us and we will try Linzess also patient has scheduled appointment for colonoscopy for the end of the month

## 2020-12-11 ENCOUNTER — Other Ambulatory Visit: Payer: Self-pay | Admitting: Family Medicine

## 2020-12-12 LAB — TOXASSURE SELECT 13 (MW), URINE

## 2021-01-14 ENCOUNTER — Ambulatory Visit (INDEPENDENT_AMBULATORY_CARE_PROVIDER_SITE_OTHER): Payer: PPO | Admitting: Internal Medicine

## 2021-01-14 ENCOUNTER — Encounter (INDEPENDENT_AMBULATORY_CARE_PROVIDER_SITE_OTHER): Payer: Self-pay | Admitting: Internal Medicine

## 2021-01-14 ENCOUNTER — Other Ambulatory Visit: Payer: Self-pay

## 2021-01-14 VITALS — BP 183/83 | HR 66 | Temp 97.9°F | Ht 66.0 in | Wt 181.2 lb

## 2021-01-14 DIAGNOSIS — K5903 Drug induced constipation: Secondary | ICD-10-CM

## 2021-01-14 DIAGNOSIS — Z86010 Personal history of colonic polyps: Secondary | ICD-10-CM

## 2021-01-14 DIAGNOSIS — K5900 Constipation, unspecified: Secondary | ICD-10-CM | POA: Insufficient documentation

## 2021-01-14 DIAGNOSIS — K219 Gastro-esophageal reflux disease without esophagitis: Secondary | ICD-10-CM

## 2021-01-14 MED ORDER — METAMUCIL SMOOTH TEXTURE 58.6 % PO POWD: 1.0000 | Freq: Every day | ORAL | Status: AC

## 2021-01-14 MED ORDER — POLYETHYLENE GLYCOL 3350 17 GM/SCOOP PO POWD: 8.5000 g | ORAL | Status: DC

## 2021-01-14 NOTE — Patient Instructions (Addendum)
Take 1 heaping tablespoonful of Metamucil every night Take 8.5 g of MiraLAX/polyethylene glycol every other day.  Can change it to daily if necessary. Do not take OTC laxative such as Senokot and Ex-Lax.  Can use fleets enema or Dulcolax suppository as needed for constipation. Colonoscopy to be scheduled in March or April 2023.

## 2021-01-14 NOTE — Progress Notes (Signed)
Presenting complaint;  Follow for constipation and GERD.  Database and subjective:  Patient is 80 year old Caucasian male who is here for scheduled visit.  He was last seen in December 2021 for GERD by Dr. Jenetta Downer. He also has history of colonic adenomas and his last colonoscopy was in March 2018 when he had 5 small tubular adenomas removed.  Patient reports onset of constipation about 2 months ago.  He got impacted and put on 11 had to take the stool out.  Since then he has been on Metamucil 3 teaspoonful daily and he says it is working.  He denies melena or rectal bleeding.  He was diagnosed with sigmoid diverticulitis on 11/06/2020 when he was evaluated by Dr. [name].  He responded to antibiotic.  He is having intermittent pain in left side but he feels the pain is coming from his hip joint.  He also complains of constant back pain which he has had for 5 years.  He has had few episodes of intractable pain requiring visit to ER.  He has had multiple studies by his neurosurgeon.  He also was seen by an orthopedic surgeon who felt hip arthritis but not the source of his pain.  Patient takes pain medication 6 times a day.  He can only walk few feet at a time.  Walking makes his pain worse.  He is using Voltaren gel which helps with her pain but he is not taking any NSAIDs.  He remains on anticoagulant for paroxysmal atrial fibrillation. He is taking PPI every other day and it helps.  He does not want me to change his prescription as he may have to go back to taking it every day. His appetite is fair and he has not lost any weight since his last visit of December 2021.  Current Medications: Outpatient Encounter Medications as of 01/14/2021  Medication Sig   acetaminophen (TYLENOL) 650 MG CR tablet Take 1,300 mg by mouth once as needed for pain.   amLODipine (NORVASC) 10 MG tablet TAKE 1 TABLET BY MOUTH EVERY DAY   diphenhydrAMINE-APAP, sleep, (TYLENOL PM EXTRA STRENGTH PO) Take 1 tablet by  mouth at bedtime.   ELIQUIS 5 MG TABS tablet TAKE 1 TABLET BY MOUTH TWICE A DAY. STOP PRADAXA   indapamide (LOZOL) 2.5 MG tablet TAKE 1 TABLET BY MOUTH EVERY DAY   latanoprost (XALATAN) 0.005 % ophthalmic solution Place 1 drop into both eyes at bedtime.   MEDROL 4 MG tablet Take by mouth.   Oxycodone HCl 10 MG TABS Take one tablet po 4 to 5 times per day (Patient taking differently: Take one tablet po 4 to 6 times per day)   pantoprazole (PROTONIX) 40 MG tablet Take 1 tablet (40 mg total) by mouth daily before breakfast.   pravastatin (PRAVACHOL) 80 MG tablet TAKE 1 TABLET BY MOUTH EVERYDAY AT BEDTIME   ramipril (ALTACE) 10 MG capsule TAKE 1 CAPSULE (10 MG TOTAL) BY MOUTH 2 (TWO) TIMES DAILY.   [DISCONTINUED] ciprofloxacin (CIPRO) 500 MG tablet Take one tablet po BID for 10 days. Take with snack and water.   [DISCONTINUED] diltiazem (CARDIZEM) 30 MG tablet Take 1/2 tablet every 4 hours AS NEEDED for afib   [DISCONTINUED] metroNIDAZOLE (FLAGYL) 500 MG tablet Take one tablet po TID for 7 days. Take with snack and water.   [DISCONTINUED] mirabegron ER (MYRBETRIQ) 50 MG TB24 tablet Take 1 tablet (50 mg total) by mouth daily.   [DISCONTINUED] Oxycodone HCl 10 MG TABS One qid prn pain   [  DISCONTINUED] Oxycodone HCl 10 MG TABS Take 1 tablet po 4 to 5 times per day   No facility-administered encounter medications on file as of 01/14/2021.     Objective: Blood pressure (!) 183/83, pulse 66, temperature 97.9 F (36.6 C), temperature source Oral, height '5\' 6"'  (1.676 m), weight 181 lb 3.2 oz (82.2 kg). Patient is alert and in no acute distress. He ambulates with the help of cane. Conjunctiva is pink. Sclera is nonicteric Oropharyngeal mucosa is normal. No neck masses or thyromegaly noted. Cardiac exam with regular rhythm normal S1 and S2. No murmur or gallop noted. Lungs are clear to auscultation. Abdomen is symmetrical.  On palpation and soft with mild tenderness at LLQ above level of inguinal  ligament.  No guarding or rebound.  No organomegaly or masses. No LE edema or clubbing noted.  Labs/studies Results:   CBC Latest Ref Rng & Units 10/17/2020 09/18/2020 09/27/2019  WBC 3.4 - 10.8 x10E3/uL 6.8 9.0 9.4  Hemoglobin 13.0 - 17.7 g/dL 14.5 14.0 15.3  Hematocrit 37.5 - 51.0 % 43.2 42.6 46.7  Platelets 150 - 450 x10E3/uL 293 263 268    CMP Latest Ref Rng & Units 10/17/2020 09/18/2020 02/29/2020  Glucose 65 - 99 mg/dL 95 183(H) 95  BUN 8 - 27 mg/dL 23 24(H) 16  Creatinine 0.76 - 1.27 mg/dL 1.04 1.11 0.88  Sodium 134 - 144 mmol/L 136 136 140  Potassium 3.5 - 5.2 mmol/L 4.6 4.3 4.3  Chloride 96 - 106 mmol/L 97 105 102  CO2 20 - 29 mmol/L '22 23 25  ' Calcium 8.6 - 10.2 mg/dL 9.5 8.8(L) 9.4  Total Protein 6.0 - 8.5 g/dL 7.1 - -  Total Bilirubin 0.0 - 1.2 mg/dL 0.5 - -  Alkaline Phos 44 - 121 IU/L 78 - -  AST 0 - 40 IU/L 26 - -  ALT 0 - 44 IU/L 24 - -    Hepatic Function Latest Ref Rng & Units 10/17/2020 09/27/2019 09/24/2019  Total Protein 6.0 - 8.5 g/dL 7.1 7.1 8.1  Albumin 3.7 - 4.7 g/dL 4.7 4.0 5.0  AST 0 - 40 IU/L '26 25 29  ' ALT 0 - 44 IU/L '24 23 24  ' Alk Phosphatase 44 - 121 IU/L 78 54 67  Total Bilirubin 0.0 - 1.2 mg/dL 0.5 1.1 1.1  Bilirubin, Direct 0.00 - 0.40 mg/dL - - -    Recent lab data reviewed.  Assessment:  #1.  Opioid-induced constipation.  He is having satisfactory response to Metamucil but he also needs to take low-dose polyethylene glycol as he is at risk for fecal impaction.  If this therapy does not work we will consider other options.  #2.  History of colonic adenomas.  He had 5 small polyps removed in March 2018 and all of these polyps are tubular adenomas.  #3.  Chronic GERD.  He had EGD back in November 2019 revealing fundic gland polyps and esophageal biopsy was unremarkable.  He is doing well with therapy.  #4.  History of diverticulitis.  He was treated about 9 weeks ago.  Abdominal exam now is benign.  It is important for him to not get impacted  again.   Plan:  Continue high-fiber diet and Metamucil every night.  Take 4 g of 1 heaping tablespoon each time. Polyethylene glycol 8.5 g or half a scoop every other day.  Can take it daily if necessary. Patient advised not to take OTC stimulant laxative such as Senokot and Ex-Lax but he can Dulcolax suppository or  fleets enema if necessary. Surveillance colonoscopy to be scheduled in March or April 2023.

## 2021-01-16 DIAGNOSIS — M25552 Pain in left hip: Secondary | ICD-10-CM | POA: Diagnosis not present

## 2021-01-17 DIAGNOSIS — M545 Low back pain, unspecified: Secondary | ICD-10-CM | POA: Diagnosis not present

## 2021-02-05 DIAGNOSIS — I10 Essential (primary) hypertension: Secondary | ICD-10-CM | POA: Diagnosis not present

## 2021-02-05 DIAGNOSIS — M5416 Radiculopathy, lumbar region: Secondary | ICD-10-CM | POA: Diagnosis not present

## 2021-02-05 DIAGNOSIS — Z6830 Body mass index (BMI) 30.0-30.9, adult: Secondary | ICD-10-CM | POA: Diagnosis not present

## 2021-02-17 DIAGNOSIS — M25511 Pain in right shoulder: Secondary | ICD-10-CM | POA: Diagnosis not present

## 2021-02-17 DIAGNOSIS — M25552 Pain in left hip: Secondary | ICD-10-CM | POA: Diagnosis not present

## 2021-02-17 DIAGNOSIS — M545 Low back pain, unspecified: Secondary | ICD-10-CM | POA: Diagnosis not present

## 2021-02-20 ENCOUNTER — Telehealth: Payer: Self-pay | Admitting: Family Medicine

## 2021-02-20 ENCOUNTER — Other Ambulatory Visit: Payer: Self-pay

## 2021-02-20 ENCOUNTER — Ambulatory Visit (INDEPENDENT_AMBULATORY_CARE_PROVIDER_SITE_OTHER): Payer: PPO | Admitting: Family Medicine

## 2021-02-20 DIAGNOSIS — U071 COVID-19: Secondary | ICD-10-CM | POA: Diagnosis not present

## 2021-02-20 MED ORDER — MOLNUPIRAVIR EUA 200MG CAPSULE: 4.0000 | ORAL_CAPSULE | Freq: Two times a day (BID) | ORAL | 0 refills | Status: AC

## 2021-02-20 NOTE — Telephone Encounter (Signed)
[name], [name] are scheduled for a virtual visit with your provider today.    Just as we do with appointments in the office, we must obtain your consent to participate.  Your consent will be active for this visit and any virtual visit you may have with one of our providers in the next 365 days.    If you have a MyChart account, I can also send a copy of this consent to you electronically.  All virtual visits are billed to your insurance company just like a traditional visit in the office.  As this is a virtual visit, video technology does not allow for your provider to perform a traditional examination.  This may limit your provider's ability to fully assess your condition.  If your provider identifies any concerns that need to be evaluated in person or the need to arrange testing such as labs, EKG, etc, we will make arrangements to do so.    Although advances in technology are sophisticated, we cannot ensure that it will always work on either your end or our end.  If the connection with a video visit is poor, we may have to switch to a telephone visit.  With either a video or telephone visit, we are not always able to ensure that we have a secure connection.   I need to obtain your verbal consent now.   Are you willing to proceed with your visit today?   [name] has provided verbal consent on 02/20/2021 for a virtual visit (video or telephone).   Vicente Males, LPN [id number]  3:08 PM

## 2021-02-20 NOTE — Telephone Encounter (Signed)
Daughter states that her mom has deep congestion and chest congestion- NO SOB but she tested very positive for Covid  Daughter states the patient- he dad has a little congestion and sinus symptoms -NO SOB but tested faintly positive    Daughter lives with them and has no symptom and was negative but helps care for someone and wants to know how long to stay away from the person before it safe to return

## 2021-02-20 NOTE — Progress Notes (Addendum)
   Subjective:    Patient ID: [name], male    DOB: 1940-10-06, 80 y.o.   MRN: [id number] Telephone only video not possible HPI Pt tested positive for COVID this morning. Pt having congestion and sinus symptoms.  Viral process Some body aches headache drainage coughing no wheezing or difficulty breathing Virtual Visit via Telephone Note  I connected with [name] on 02/20/21 at  4:50 PM EDT by telephone and verified that I am speaking with the correct person using two identifiers.  Location: Patient: home Provider: office   I discussed the limitations, risks, security and privacy concerns of performing an evaluation and management service by telephone and the availability of in person appointments. I also discussed with the patient that there may be a patient responsible charge related to this service. The patient expressed understanding and agreed to proceed.   History of Present Illness:    Observations/Objective:   Assessment and Plan:   Follow Up Instructions:    I discussed the assessment and treatment plan with the patient. The patient was provided an opportunity to ask questions and all were answered. The patient agreed with the plan and demonstrated an understanding of the instructions.   The patient was advised to call back or seek an in-person evaluation if the symptoms worsen or if the condition fails to improve as anticipated.  I provided 13 minutes of non-face-to-face time during this encounter.      Review of Systems     Objective:   Physical Exam Today's visit was via telephone Physical exam was not possible for this visit        Assessment & Plan:  COVID infection Due to multiple drug interactions recommend Molnupiravir rather than Paxlovid  Warning signs discussed in detail follow-up here or ER if worse

## 2021-02-20 NOTE — Telephone Encounter (Signed)
Patients daughter called in stating that patient has taken a covid test at home and is positive. She would like to know what patient needs to do.   CB# [phone]

## 2021-02-20 NOTE — Telephone Encounter (Signed)
Pt daughter contacted. Pt would like to try Paxlovid. Pt added to schedule for today. Reviewed Paxlovid Paragraph with daughter.

## 2021-02-20 NOTE — Telephone Encounter (Signed)
Please connect with family If interested with Paxlovid I can do a brief phone visit  For patients were interested in [place] please review the following: Paxlovid is a antiviral medicine that can help lessen the severity of COVID and more importantly reduce the risk of severe complications and hospitalizations. Side effects-generally well-tolerated This medication is taken twice daily for 5 days with a snack.  Approximately 15% of people will have nausea with the medicine.  Approximately 20% can have some loose stools or diarrhea.  30% - 40% can have a funny metallic taste with their food for 5 days. If a person chooses to utilize this medicine we always check there recent labs to look at the kidney functions plus also their medication list.  There are certain medications that may not get along with this medicine we will advise them what they can or cannot take.  Finally in situations where due to medications or kidney functions that this medicine is not possible there is a different medicine that can be used in those situations.

## 2021-02-23 ENCOUNTER — Other Ambulatory Visit: Payer: Self-pay | Admitting: Family Medicine

## 2021-02-23 MED ORDER — OXYCODONE HCL 10 MG PO TABS: ORAL_TABLET | ORAL | 0 refills | Status: DC

## 2021-02-26 ENCOUNTER — Ambulatory Visit: Payer: PPO | Admitting: Family Medicine

## 2021-03-10 ENCOUNTER — Other Ambulatory Visit: Payer: Self-pay | Admitting: Neurological Surgery

## 2021-03-11 ENCOUNTER — Other Ambulatory Visit: Payer: Self-pay | Admitting: Family Medicine

## 2021-03-11 ENCOUNTER — Other Ambulatory Visit (HOSPITAL_COMMUNITY): Payer: Self-pay | Admitting: Neurological Surgery

## 2021-03-11 ENCOUNTER — Other Ambulatory Visit: Payer: Self-pay | Admitting: Neurological Surgery

## 2021-03-11 MED ORDER — OXYCODONE HCL 10 MG PO TABS: ORAL_TABLET | ORAL | 0 refills | Status: DC

## 2021-03-11 NOTE — Telephone Encounter (Signed)
Patient is requesting refill on oxycodone 10 mg called [facility] [place] . Has appointment on 11/3 for follow up.

## 2021-03-12 ENCOUNTER — Other Ambulatory Visit (HOSPITAL_COMMUNITY): Payer: Self-pay | Admitting: Neurological Surgery

## 2021-03-12 ENCOUNTER — Other Ambulatory Visit: Payer: Self-pay | Admitting: Neurological Surgery

## 2021-03-12 DIAGNOSIS — M542 Cervicalgia: Secondary | ICD-10-CM

## 2021-03-12 DIAGNOSIS — M5414 Radiculopathy, thoracic region: Secondary | ICD-10-CM

## 2021-03-12 DIAGNOSIS — M5416 Radiculopathy, lumbar region: Secondary | ICD-10-CM

## 2021-03-13 ENCOUNTER — Ambulatory Visit: Payer: PPO | Admitting: Urology

## 2021-03-13 ENCOUNTER — Ambulatory Visit: Payer: PPO | Admitting: Family Medicine

## 2021-03-19 DIAGNOSIS — M7062 Trochanteric bursitis, left hip: Secondary | ICD-10-CM | POA: Diagnosis not present

## 2021-03-19 DIAGNOSIS — M545 Low back pain, unspecified: Secondary | ICD-10-CM | POA: Diagnosis not present

## 2021-03-19 DIAGNOSIS — M25511 Pain in right shoulder: Secondary | ICD-10-CM | POA: Diagnosis not present

## 2021-03-19 DIAGNOSIS — M25552 Pain in left hip: Secondary | ICD-10-CM | POA: Diagnosis not present

## 2021-03-20 ENCOUNTER — Ambulatory Visit (INDEPENDENT_AMBULATORY_CARE_PROVIDER_SITE_OTHER): Payer: PPO | Admitting: Family Medicine

## 2021-03-20 ENCOUNTER — Encounter: Payer: Self-pay | Admitting: Family Medicine

## 2021-03-20 ENCOUNTER — Other Ambulatory Visit: Payer: Self-pay

## 2021-03-20 VITALS — BP 153/62 | HR 73 | Temp 97.5°F | Ht 66.0 in | Wt 182.0 lb

## 2021-03-20 DIAGNOSIS — M549 Dorsalgia, unspecified: Secondary | ICD-10-CM | POA: Diagnosis not present

## 2021-03-20 DIAGNOSIS — Z23 Encounter for immunization: Secondary | ICD-10-CM | POA: Diagnosis not present

## 2021-03-20 DIAGNOSIS — G8929 Other chronic pain: Secondary | ICD-10-CM

## 2021-03-20 NOTE — Progress Notes (Signed)
   Subjective:    Patient ID: [name], male    DOB: 01/21/1941, 80 y.o.   MRN: [id number]  HPI  This patient was seen today for chronic pain  *Fall 3 weeks ago - R shoulder and hip pain  The medication list was reviewed and updated.  Location of Pain for which the patient has been treated with regarding narcotics: low back, right hip, r shoulder  Onset of this pain: chronic    -Compliance with medication: daily  - Number patient states they take daily: 4 fell - 3 weeks ago  -when was the last dose patient took? Today 10 am  The patient was advised the importance of maintaining medication and not using illegal substances with these.  Here for refills and follow up  The patient was educated that we can provide 3 monthly scripts for their medication, it is their responsibility to follow the instructions.  Side effects or complications from medications: none  Patient is aware that pain medications are meant to minimize the severity of the pain to allow their pain levels to improve to allow for better function. They are aware of that pain medications cannot totally remove their pain.  Due for UDT ( at least once per year) : 12/09/20  Scale of 1 to 10 ( 1 is least 10 is most) Your pain level without the medicine: 10 Your pain level with medication 3  Scale 1 to 10 ( 1-helps very little, 10 helps very well) How well does your pain medication reduce your pain so you can function better through out the day? 3  Quality of the pain: ranges  Persistence of the pain: all day   Modifying factors:        Review of Systems     Objective:   Physical Exam  General-in no acute distress Eyes-no discharge Lungs-respiratory rate normal, CTA CV-no murmurs,RRR Extremities skin warm dry no edema Neuro grossly normal Behavior normal, alert       Assessment & Plan:  It is hard to ascertain how much pain medicine he is taking recently He states he has had significant  downturn in his pain with an increase overall in pain He states he is taking sometimes 5 or 6/day I have encouraged him to bring his bottle by before we prescribe further medicine he was supposed to bring this by later this week or early next week

## 2021-03-24 ENCOUNTER — Telehealth: Payer: Self-pay | Admitting: Family Medicine

## 2021-03-24 NOTE — Telephone Encounter (Signed)
Pt brought pain med bottle in office. Pt advised to make what he had last until Monday. May need to consider pain management. [facility] Appt made for 04/17/21 at 11am. Pt verbalized understanding. Please advise. Thank you

## 2021-03-26 ENCOUNTER — Other Ambulatory Visit: Payer: Self-pay | Admitting: Family Medicine

## 2021-03-26 MED ORDER — OXYCODONE HCL 10 MG PO TABS: ORAL_TABLET | ORAL | 0 refills | Status: DC

## 2021-03-26 NOTE — Telephone Encounter (Signed)
When the patient was in the other day I did discuss with him that going up to 6/day we would be looking at referring him for pain management. He has a follow-up visit at the start of December Please find out from the patient if he would like for [place] to go ahead and initiate referral for pain management or if he is uncertain about this we can discuss it further at his follow-up visit regarding the options for pain management

## 2021-03-27 ENCOUNTER — Telehealth: Payer: Self-pay

## 2021-03-27 NOTE — Telephone Encounter (Signed)
Patient has been informed per drs notes and recommendations, patient states he will wait and see what the myelogram and other imaging say and discuss at next appt.

## 2021-04-02 ENCOUNTER — Ambulatory Visit (HOSPITAL_COMMUNITY)
Admission: RE | Admit: 2021-04-02 | Discharge: 2021-04-02 | Disposition: A | Discharge status: Home / Self Care | Payer: PPO | Source: Ambulatory Visit | Attending: Neurological Surgery | Admitting: Neurological Surgery

## 2021-04-02 ENCOUNTER — Other Ambulatory Visit: Payer: Self-pay

## 2021-04-02 DIAGNOSIS — M5124 Other intervertebral disc displacement, thoracic region: Secondary | ICD-10-CM | POA: Diagnosis not present

## 2021-04-02 DIAGNOSIS — M47812 Spondylosis without myelopathy or radiculopathy, cervical region: Secondary | ICD-10-CM | POA: Insufficient documentation

## 2021-04-02 DIAGNOSIS — M4180 Other forms of scoliosis, site unspecified: Secondary | ICD-10-CM | POA: Insufficient documentation

## 2021-04-02 DIAGNOSIS — M5414 Radiculopathy, thoracic region: Secondary | ICD-10-CM | POA: Diagnosis not present

## 2021-04-02 DIAGNOSIS — M4322 Fusion of spine, cervical region: Secondary | ICD-10-CM | POA: Diagnosis not present

## 2021-04-02 DIAGNOSIS — M542 Cervicalgia: Secondary | ICD-10-CM | POA: Insufficient documentation

## 2021-04-02 DIAGNOSIS — M47814 Spondylosis without myelopathy or radiculopathy, thoracic region: Secondary | ICD-10-CM | POA: Insufficient documentation

## 2021-04-02 DIAGNOSIS — M5416 Radiculopathy, lumbar region: Secondary | ICD-10-CM | POA: Diagnosis not present

## 2021-04-02 DIAGNOSIS — M4804 Spinal stenosis, thoracic region: Secondary | ICD-10-CM | POA: Insufficient documentation

## 2021-04-02 DIAGNOSIS — M4135 Thoracogenic scoliosis, thoracolumbar region: Secondary | ICD-10-CM | POA: Diagnosis not present

## 2021-04-02 DIAGNOSIS — M2578 Osteophyte, vertebrae: Secondary | ICD-10-CM | POA: Diagnosis not present

## 2021-04-02 DIAGNOSIS — M4323 Fusion of spine, cervicothoracic region: Secondary | ICD-10-CM | POA: Diagnosis not present

## 2021-04-02 DIAGNOSIS — M4126 Other idiopathic scoliosis, lumbar region: Secondary | ICD-10-CM | POA: Diagnosis not present

## 2021-04-02 DIAGNOSIS — M4726 Other spondylosis with radiculopathy, lumbar region: Secondary | ICD-10-CM | POA: Insufficient documentation

## 2021-04-02 MED ORDER — LIDOCAINE HCL (PF) 1 % IJ SOLN
5.0000 mL | Freq: Once | INTRAMUSCULAR | Status: AC
  Administered 2021-04-02: 5 mL via INTRADERMAL

## 2021-04-02 MED ORDER — ONDANSETRON HCL 4 MG/2ML IJ SOLN: 4.0000 mg | Freq: Four times a day (QID) | INTRAMUSCULAR | Status: DC | PRN

## 2021-04-02 MED ORDER — OXYCODONE-ACETAMINOPHEN 5-325 MG PO TABS: 1.0000 | ORAL_TABLET | ORAL | Status: DC | PRN

## 2021-04-02 MED ORDER — DIAZEPAM 5 MG PO TABS
10.0000 mg | ORAL_TABLET | Freq: Once | ORAL | Status: AC
  Administered 2021-04-02: 10 mg via ORAL
  Filled 2021-04-02: qty 2

## 2021-04-02 MED ORDER — IOHEXOL 300 MG/ML  SOLN
10.0000 mL | Freq: Once | INTRAMUSCULAR | Status: AC | PRN
  Administered 2021-04-02: 9 mL via INTRATHECAL

## 2021-04-02 NOTE — Procedures (Signed)
Date of procedure: 04/02/2021 Preoperative diagnosis: Severe lumbar spondylosis and stenosis with degenerative scoliosis history of fusion from L2 to sacrum.  Severe left hip pain.  Severe spondylosis cervical spine and thoracic spine Postoperative diagnosis: Same Procedure: Total myelogram Surgeon: Kristeen Miss Indications: [name] is an 80 year old individual whose had significant spondylitic disease in the cervical spine and the in the lumbar spine he has had severe right hip pain for the last 3 years time and despite efforts second conservative treatment and MRIs of his hips his joints appear to be intact a CT scan had been performed previously of his lumbar spine he had some adjacent level disease but no severe stenosis.  Because the pain is been persisting and progressing total myelogram has been advised at this time to look at all areas of his spine including the thoracic region and the cervical spine.  Pre op Dx: Lumbar spondylosis cervical spondylosis lumbar radiculopathy history of fusion cervical spine lumbar spine, thoracic spondylosis with myelopathy Post op Dx: Same Procedure: Total myelogram Surgeon: Oree Hislop Puncture level: L1 to Fluid color: Clear colorless Injection: Iohexol 300, 9 mL Findings: High-grade block L1-L2 with diffuse spondylosis elsewhere.  Further evaluation with CT scanning.

## 2021-04-03 ENCOUNTER — Other Ambulatory Visit: Payer: Self-pay | Admitting: Family Medicine

## 2021-04-03 ENCOUNTER — Telehealth: Payer: Self-pay | Admitting: Family Medicine

## 2021-04-03 MED ORDER — OXYCODONE HCL 10 MG PO TABS: ORAL_TABLET | ORAL | 0 refills | Status: DC

## 2021-04-03 NOTE — Telephone Encounter (Signed)
[facility], he cannot exceed 5 or 6 tablets/day, once he has his surgery he will need to taper down or we will have to refer to pain management

## 2021-04-03 NOTE — Telephone Encounter (Signed)
Patient has an appointment 12/1. Wife said patient is in a lot of pain. Waiting on back surgery on 3 discs. Asked to get pain meds filled until appointment. Please advise.   [facility]  [phone]

## 2021-04-04 ENCOUNTER — Other Ambulatory Visit: Payer: Self-pay

## 2021-04-04 DIAGNOSIS — Z6828 Body mass index (BMI) 28.0-28.9, adult: Secondary | ICD-10-CM | POA: Diagnosis not present

## 2021-04-04 DIAGNOSIS — M5416 Radiculopathy, lumbar region: Secondary | ICD-10-CM | POA: Diagnosis not present

## 2021-04-04 DIAGNOSIS — R030 Elevated blood-pressure reading, without diagnosis of hypertension: Secondary | ICD-10-CM | POA: Diagnosis not present

## 2021-04-04 MED ORDER — OXYCODONE HCL 10 MG PO TABS: ORAL_TABLET | ORAL | 0 refills | Status: DC

## 2021-04-04 NOTE — Telephone Encounter (Signed)
Pt returned call and verbalized understanding. Pt states he just left surgeon office and he is going to schedule surgery as soon as he possible can. Pt also fell yesterday and "finished off his right shoulder"

## 2021-04-04 NOTE — Telephone Encounter (Signed)
Left message to return call 

## 2021-04-08 ENCOUNTER — Other Ambulatory Visit: Payer: Self-pay | Admitting: Neurological Surgery

## 2021-04-15 ENCOUNTER — Telehealth (HOSPITAL_COMMUNITY): Payer: Self-pay | Admitting: *Deleted

## 2021-04-15 NOTE — Telephone Encounter (Signed)
Received request for cardiac clearance from Dr. Ellene Route for T12-L1 Fusion. Please reivew and contact pt for appt as necessary for evaluation.

## 2021-04-16 NOTE — Telephone Encounter (Signed)
Pharmacy, can you please comment on how long Eliquis can be held for upcoming procedure?  Thank you! 

## 2021-04-16 NOTE — Telephone Encounter (Signed)
Pre-op covering staff, can you please contact requesting surgeon's office and help get a full clearance form entered?  Thank you!

## 2021-04-16 NOTE — Telephone Encounter (Signed)
Patient with diagnosis of afib on Eliquis for anticoagulation.    Procedure: T12-L1 fusion Date of procedure: 05/06/21  CHA2DS2-VASc Score = 4  This indicates a 4.8% annual risk of stroke. 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

## 2021-04-16 NOTE — Telephone Encounter (Signed)
   Pre-operative Risk Assessment    Patient Name: [name]  DOB: 1940/12/06 MRN: [id number]      Request for Surgical Clearance   Procedure:   T12-L1 FUSION  Date of Surgery: Clearance 05/06/21                                 Surgeon:  DR. Kristeen Miss Surgeon's Group or Practice Name:  [facility] Phone number:  [phone] ATTN: VANESSA EXT 244 Fax number:  [phone] ATTN: Lorriane Shire   Type of Clearance Requested: - Medical  - Pharmacy:  Hold Apixaban (Eliquis)     Type of Anesthesia:   General    Additional requests/questions:   Jiles Prows   04/16/2021, 12:20 PM

## 2021-04-17 ENCOUNTER — Ambulatory Visit (INDEPENDENT_AMBULATORY_CARE_PROVIDER_SITE_OTHER): Payer: PPO | Admitting: Family Medicine

## 2021-04-17 ENCOUNTER — Encounter: Payer: Self-pay | Admitting: Family Medicine

## 2021-04-17 ENCOUNTER — Other Ambulatory Visit: Payer: Self-pay

## 2021-04-17 VITALS — BP 138/76 | Temp 97.5°F | Wt 177.8 lb

## 2021-04-17 DIAGNOSIS — E7849 Other hyperlipidemia: Secondary | ICD-10-CM

## 2021-04-17 DIAGNOSIS — M549 Dorsalgia, unspecified: Secondary | ICD-10-CM | POA: Diagnosis not present

## 2021-04-17 DIAGNOSIS — I10 Essential (primary) hypertension: Secondary | ICD-10-CM | POA: Diagnosis not present

## 2021-04-17 DIAGNOSIS — G8929 Other chronic pain: Secondary | ICD-10-CM

## 2021-04-17 DIAGNOSIS — I6523 Occlusion and stenosis of bilateral carotid arteries: Secondary | ICD-10-CM | POA: Diagnosis not present

## 2021-04-17 DIAGNOSIS — Z79891 Long term (current) use of opiate analgesic: Secondary | ICD-10-CM

## 2021-04-17 MED ORDER — OXYCODONE HCL 10 MG PO TABS: ORAL_TABLET | ORAL | 0 refills | Status: DC

## 2021-04-17 NOTE — Progress Notes (Signed)
   Subjective:    Patient ID: [name], male    DOB: 07/07/1940, 80 y.o.   MRN: [id number]  HPI Pt here for medication check and back pain. Pt states that he is getting "to where he cant do nothing". Pt is having back surgery May 06, 2021. Pt states his shoulder pain has not improved.  Patient relates severe right shoulder pain he states orthopedics is talk about shoulder replacement He also relates severe low back pain and the neurosurgeon is talking about doing rods to help him  Review of Systems     Objective:   Physical Exam  General-in no acute distress Eyes-no discharge Lungs-respiratory rate normal, CTA CV-no murmurs,RRR Extremities skin warm dry no edema Neuro grossly normal Behavior normal, alert Subjective right shoulder pain and discomfort as well as low back pain walks with a cane      Assessment & Plan:  The patient was seen in followup for chronic pain. A review over at their current pain status was discussed. Drug registry was checked. Prescriptions were given.  Regular follow-up recommended. Discussion was held regarding the importance of compliance with medication as well as pain medication contract.  Patient was informed that medication may cause drowsiness and should not be combined  with other medications/alcohol or street drugs. If the patient feels medication is causing altered alertness then do not drive or operate dangerous equipment.  Should be noted that the patient appears to be meeting appropriate use of opioids and response.  Evidenced by improved function and decent pain control without significant side effects and no evidence of overt aberrancy issues.  Upon discussion with the patient today they understand that opioid therapy is optional and they feel that the pain has been refractory to reasonable conservative measures and is significant and affecting quality of life enough to warrant ongoing therapy and wishes to continue opioids.  Refills were  provided.  1 prescription sent in today that he can get filled on the 22nd Has surgery set up for later this month It is medically necessary for him to have pain medicine to allow him to function as best as possible not to exceed 6/day hopefully will be able to taper it down some after his surgery  His scans that he had recently had done shows carotid artery atherosclerosis check lipid profile.  In addition to this check carotid ultrasounds.  Lab work ordered await results  HTN doing decent on his current blood pressure medicine given the amount of pain he is having

## 2021-04-17 NOTE — Telephone Encounter (Signed)
S/w pre op provider today Sande Rives, [facility] and clarified if pt needs to see EP as a new pt again since last seen by Dr. Rayann Heman 2016 or can pt be seen as new pt with gen card. Per pre op provider pt can be seen by gen cards. I am going to send a message to Chart Prep team to follow up on with getting a referral sent to our office as well as message to our scheduling Team to reach out to the pt with a New Pt appt with General Cardiology. At this point I am not positive that we will be able to get the pt in and cleared before his surgery date, however we will do our very best to help accommodate the pt with out delay this surgery if we can.

## 2021-04-17 NOTE — Telephone Encounter (Signed)
   Name: [name]  DOB: 01/21/1941  MRN: [id number]  Primary Cardiologist: Dr. Rayann Heman  Chart reviewed as part of pre-operative protocol coverage. Patient has not been seen in our office since 2016 but has been following in the [facility] since then. He was last seen by Roderic Palau, NP, in the [facility] in 09/2020 at which he reported chest pain with palpitations, exertional shortness of breath, and exertional fatigue. Plan was for Echo and Myoview but I do not see that these were ordered or done. Therefore, I think he needs a follow-up visit in order to better assess preoperative cardiovascular risk.  Pre-op covering staff: - Please schedule appointment and call patient to inform them. If patient already had an upcoming appointment within acceptable timeframe, please add "pre-op clearance" to the appointment notes so provider is aware. - Please contact requesting surgeon's office via preferred method (i.e, phone, fax) to inform them of need for appointment prior to surgery.  [name], PA-C  04/17/2021, 8:43 AM

## 2021-04-18 DIAGNOSIS — E7849 Other hyperlipidemia: Secondary | ICD-10-CM | POA: Diagnosis not present

## 2021-04-18 DIAGNOSIS — I10 Essential (primary) hypertension: Secondary | ICD-10-CM | POA: Diagnosis not present

## 2021-04-18 NOTE — Telephone Encounter (Signed)
I followed up with our scheduling team and confirmed that our scheduling team has tried to reach the pt for appt. I will send a note to the requesting office in hopes that if they s/w the pt if they could please ask the pt to call his cardiology office for make appt.

## 2021-04-19 LAB — LIPID PANEL
Chol/HDL Ratio: 3.7 ratio (ref 0.0–5.0)
Cholesterol, Total: 161 mg/dL (ref 100–199)
HDL: 43 mg/dL (ref >39)
LDL Chol Calc (NIH): 104 mg/dL — ABNORMAL HIGH (ref 0–99)
Triglycerides: 70 mg/dL (ref 0–149)
VLDL Cholesterol Cal: 14 mg/dL (ref 5–40)

## 2021-04-19 LAB — BASIC METABOLIC PANEL
BUN/Creatinine Ratio: 17 (ref 10–24)
BUN: 19 mg/dL (ref 8–27)
CO2: 24 mmol/L (ref 20–29)
Calcium: 9.9 mg/dL (ref 8.6–10.2)
Chloride: 99 mmol/L (ref 96–106)
Creatinine, Ser: 1.1 mg/dL (ref 0.76–1.27)
Glucose: 101 mg/dL — ABNORMAL HIGH (ref 70–99)
Potassium: 4.2 mmol/L (ref 3.5–5.2)
Sodium: 141 mmol/L (ref 134–144)
eGFR: 68 mL/min/{1.73_m2} (ref >59)

## 2021-04-20 ENCOUNTER — Encounter: Payer: Self-pay | Admitting: Family Medicine

## 2021-04-22 NOTE — Telephone Encounter (Signed)
I was able to reach the pt today and explained that he needs a NEW PT appt for pre op clearance. Pt is followed by [facility] clinic though pre op provider stated pt needs to be est with gen card as well. Pt has been scheduled to see Dr. Warren Lacy 04/25/21 @ 9:40 at the [facility] office. Pt agreeable and asked for me to please call him back and leave address and appt info on his phone as he was not able to write anything down currently. I assured the pt that I will do leave message for him with address and date and time of appt, name of cardiologist. Pt thanked me for the call and the help. I will forward notes to Dr. Warren Lacy for appt. I will update the surgeon's office as well as we do have and appt for the pt 04/25/21.   I called back to leave message for the pt as he asked , however when I called back the pt answered the phone. All information has been given to the pt with verbal understanding. Pt thanked me again for all of the help. Address [place], [address]nd floor ph# [phone], Dr. Percival Spanish 04/25/21 @ 9:40 am arrive 15 minutes early for check in.

## 2021-04-22 NOTE — Telephone Encounter (Signed)
I sent another message to our scheduling team to see if they may be able to try and reach the pt again for NEW PT Appt for pre op clearance. See previous notes, scheduling team has reached out to the pt: From: Imagene Gurney  Sent: 04/18/2021  11:05 AM EST  To: Michae Kava, CMA  Subject: RE: NEW PT APPT FOR PRE OP PLEASE              I've called both numbers but no answer, LVM to call back   Will update the requesting office that we are still trying to reach the pt for NEW PT Appt for pre op clearance. In the hope that the requesting office s/w the pt if they will please let the pt know he needs to call the cardiology office [phone] and let the scheduler know that he is calling to make a NEW PT Appt for pre op clearance. Pt can see an general cardiologist per pre op provider, Sande Rives, PAC.

## 2021-04-24 ENCOUNTER — Encounter: Payer: Self-pay | Admitting: Cardiology

## 2021-04-24 NOTE — Progress Notes (Signed)
Cardiology Office Note   Date:  04/25/2021   ID:  [name], DOB 1940-11-18, MRN [id number]  PCP:  Kathyrn Drown, MD  Cardiologist:   None   Chief Complaint  Patient presents with   Atrial Fibrillation       History of Present Illness: [name] is a 80 y.o. male who presents for evaluation of atrial fib.  He had ablation in 2011.  He is followed yearly in the atrial fib clinic.      He is here perop.  He is going to have lumbar back surgery.The patient denies any new symptoms such as chest discomfort, neck or arm discomfort. There has been no new shortness of breath, PND or orthopnea. There have been no reported palpitations, presyncope or syncope.  Despite having very severe back pain he has 25 pounds.  He is going to do his fence today.  He splits wood for his wood burner.  The patient denies any new symptoms such as chest discomfort, neck or arm discomfort. There has been no new shortness of breath, PND or orthopnea. There have been no reported palpitations, presyncope or syncope.    Past Medical History:  Diagnosis Date   Arthritis    Cancer ([place])    skin   Carpal tunnel syndrome    Cataract    left eye   Chronic back pain    multiple back surgeries   Complication of anesthesia    pt states woke up during hemorroid surgery   DVT (deep venous thrombosis) ([place]) 03/2007   left leg    GERD (gastroesophageal reflux disease)    takes Protonix daily   Glaucoma    History of colon polyps    History of hyperglycemia    History of MRSA infection 1992   HTN (hypertension)    takes  Ramipril daily   Hyperlipidemia    takes Pravastatin 3 times a week   Nocturia    Paroxysmal atrial fibrillation ([facility])    takes Pradaxa daily, Chads2vasc score 2(07/11/14)   Pneumonia    many yrs ago   Pulmonary embolism ([place]) 2008    Past Surgical History:  Procedure Laterality Date   BIOPSY  04/01/2018   Procedure: BIOPSY;  Surgeon: Rogene Houston, MD;  [facility];  Service: Endoscopy;;  gastric   CARDIAC CATHETERIZATION  2008   CARPAL TUNNEL RELEASE Right    cataract surgery Right    cervical and lumbar surgery     COLONOSCOPY  04/24/2011   Procedure: COLONOSCOPY;  Surgeon: Rogene Houston, MD;  [facility];  Service: Endoscopy;  Laterality: N/A;  1200   COLONOSCOPY N/A 08/13/2016   Procedure: COLONOSCOPY;  Surgeon: Rogene Houston, MD;  [facility];  Service: Endoscopy;  Laterality: N/A;  1200   ESOPHAGOGASTRODUODENOSCOPY  03/10/2012   Procedure: ESOPHAGOGASTRODUODENOSCOPY (EGD);  Surgeon: Rogene Houston, MD;  [facility];  Service: Endoscopy;  Laterality: N/A;  325   ESOPHAGOGASTRODUODENOSCOPY (EGD) WITH PROPOFOL N/A 04/01/2018   Procedure: ESOPHAGOGASTRODUODENOSCOPY (EGD) WITH PROPOFOL;  Surgeon: Rogene Houston, MD;  [facility];  Service: Endoscopy;  Laterality: N/A;   HARDWARE REMOVAL N/A 04/24/2014   Procedure: REMOVAL OF SACRAL INSTRUMENTATION WITH [place];  Surgeon: Kristeen Miss, MD;  Location: [facility];  Service: Neurosurgery;  Laterality: N/A;   HEMORRHOIDECTOMY WITH HEMORRHOID BANDING     KNEE ARTHROSCOPY Right    KNEE ARTHROSCOPY WITH MEDIAL MENISECTOMY Right  08/03/2018   Procedure: RIGHT KNEE ARTHROSCOPY WITH PARTIAL MEDIAL MENISCECTOMY;  Surgeon: Leandrew Koyanagi, MD;  Location: [facility];  Service: Orthopedics;  Laterality: Right;   KNEE SURGERY     left   LASIK     POLYPECTOMY  08/13/2016   Procedure: POLYPECTOMY;  Surgeon: Rogene Houston, MD;  [facility];  Service: Endoscopy;;  colon   PVI  01/24/10   afib ablation     Current Outpatient Medications  Medication Sig Dispense Refill   amLODipine (NORVASC) 10 MG tablet TAKE 1 TABLET BY MOUTH EVERY DAY 90 tablet 1   diphenhydrAMINE-APAP, sleep, (TYLENOL PM EXTRA STRENGTH PO) Take 1 tablet by mouth at bedtime as needed (sleep).     ELIQUIS 5 MG TABS tablet TAKE 1 TABLET BY MOUTH TWICE A DAY. STOP  PRADAXA 60 tablet 5   indapamide (LOZOL) 2.5 MG tablet TAKE 1 TABLET BY MOUTH EVERY DAY 90 tablet 1   latanoprost (XALATAN) 0.005 % ophthalmic solution Place 1 drop into both eyes at bedtime.     Oxycodone HCl 10 MG TABS Take one tablet po  6 times per day (Patient taking differently: Take 10 mg by mouth in the morning, at noon, in the evening, and at bedtime.) 180 tablet 0   pantoprazole (PROTONIX) 40 MG tablet Take 1 tablet (40 mg total) by mouth daily before breakfast. (Patient taking differently: Take 40 mg by mouth 2 (two) times daily as needed (acid reflux).) 90 tablet 4   polyethylene glycol powder (GLYCOLAX/MIRALAX) 17 GM/SCOOP powder Take 8.5 g by mouth every other day.     pravastatin (PRAVACHOL) 80 MG tablet TAKE 1 TABLET BY MOUTH EVERYDAY AT BEDTIME 90 tablet 1   psyllium (METAMUCIL SMOOTH TEXTURE) 58.6 % powder Take 1 packet by mouth at bedtime. (Patient taking differently: Take 1 packet by mouth 3 (three) times daily as needed (constipation).)     ramipril (ALTACE) 10 MG capsule TAKE 1 CAPSULE (10 MG TOTAL) BY MOUTH 2 (TWO) TIMES DAILY. 180 capsule 1   No current facility-administered medications for this visit.    Allergies:   Sectral [acebutolol hcl], Sulfonamide derivatives, and Zocor [simvastatin]    ROS:  Please see the history of present illness.   Otherwise, review of systems are positive for none.   All other systems are reviewed and negative.    PHYSICAL EXAM: VS:  BP 124/72   Pulse 83   Ht 5\' 6"  (1.676 m)   Wt 172 lb 9.6 oz (78.3 kg)   SpO2 94%   BMI 27.86 kg/m  , BMI Body mass index is 27.86 kg/m. GENERAL:  Well appearing HEENT:  Pupils equal round and reactive, fundi not visualized, oral mucosa unremarkable NECK:  No jugular venous distention, waveform within normal limits, carotid upstroke brisk and symmetric, no bruits, no thyromegaly LYMPHATICS:  No cervical, inguinal adenopathy LUNGS:  Clear to auscultation bilaterally BACK:  No CVA tenderness CHEST:   Unremarkable HEART:  PMI not displaced or sustained,S1 and S2 within normal limits, no S3, no S4, no clicks, no rubs, very brief soft apical systolic murmur nonradiating, no diastolic murmurs ABD:  Flat, positive bowel sounds normal in frequency in pitch, no bruits, no rebound, no guarding, no midline pulsatile mass, no hepatomegaly, no splenomegaly EXT:  2 plus pulses throughout, no edema, no cyanosis no clubbing SKIN:  No rashes no nodules NEURO:  Cranial nerves II through XII grossly intact, motor grossly intact throughout PSYCH:  Cognitively intact, oriented to  person place and time    EKG:  EKG is ordered today. The ekg ordered today demonstrates atrial fibrillation, rate 84, premature ectopic complexes, T wave inversions in the inferior leads unchanged from previous.     Recent Labs: 10/17/2020: ALT 24; Hemoglobin 14.5; Platelets 293 04/18/2021: BUN 19; Creatinine, Ser 1.10; Potassium 4.2; Sodium 141    Lipid Panel    Component Value Date/Time   CHOL 161 04/18/2021 0825   TRIG 70 04/18/2021 0825   HDL 43 04/18/2021 0825   CHOLHDL 3.7 04/18/2021 0825   CHOLHDL 2.9 03/16/2014 1219   VLDL 12 03/16/2014 1219   LDLCALC 104 (H) 04/18/2021 0825      Wt Readings from Last 3 Encounters:  04/25/21 172 lb 9.6 oz (78.3 kg)  04/17/21 177 lb 12.8 oz (80.6 kg)  04/02/21 187 lb (84.8 kg)      Other studies Reviewed: Additional studies/ records that were reviewed today include: Afib Clinic. Review of the above records demonstrates:  Please see elsewhere in the note.     ASSESSMENT AND PLAN:  PAF:  [name] has a CHA2DS2 - VASc score of  4.  He can hold his Eliquis 3 days prior to his back surgery.  PREOP: The patient has no high risk findings.  He has a functional level above 5 METS.  His surgery is not high risk but probably moderate risk.  Cording to ACC/AHA guidelines he is an acceptable risk for the planned procedure.  Current medicines are reviewed at length with  the patient today.  The patient does not have concerns regarding medicines.  The following changes have been made:  no change  Labs/ tests ordered today include: None  Orders Placed This Encounter  Procedures   EKG 12-Lead      Disposition:   FU with Atrial Fib Clinic.     Signed, Minus Breeding, MD  04/25/2021 11:23 AM    [facility]

## 2021-04-25 ENCOUNTER — Encounter: Payer: Self-pay | Admitting: Cardiology

## 2021-04-25 ENCOUNTER — Ambulatory Visit (INDEPENDENT_AMBULATORY_CARE_PROVIDER_SITE_OTHER): Payer: PPO | Admitting: Cardiology

## 2021-04-25 ENCOUNTER — Other Ambulatory Visit: Payer: Self-pay

## 2021-04-25 VITALS — BP 124/72 | HR 83 | Ht 66.0 in | Wt 172.6 lb

## 2021-04-25 DIAGNOSIS — Z01810 Encounter for preprocedural cardiovascular examination: Secondary | ICD-10-CM

## 2021-04-25 DIAGNOSIS — I480 Paroxysmal atrial fibrillation: Secondary | ICD-10-CM | POA: Diagnosis not present

## 2021-04-25 NOTE — Patient Instructions (Signed)
Medication Instructions:  Your Physician recommend you continue on your current medication as directed.    *If you need a refill on your cardiac medications before your next appointment, please call your pharmacy*  Follow-Up: At [facility], you and your health needs are our priority.  As part of our continuing mission to provide you with exceptional heart care, we have created designated Provider Care Teams.  These Care Teams include your primary Cardiologist (physician) and Advanced Practice Providers (APPs -  Physician Assistants and Nurse Practitioners) who all work together to provide you with the care you need, when you need it.  We recommend signing up for the patient portal called "MyChart".  Sign up information is provided on this After Visit Summary.  MyChart is used to connect with patients for Virtual Visits (Telemedicine).  Patients are able to view lab/test results, encounter notes, upcoming appointments, etc.  Non-urgent messages can be sent to your provider as well.   To learn more about what you can do with MyChart, go to [email or web address].    Your next appointment:   Follow up with your afib clinic.

## 2021-04-25 NOTE — Progress Notes (Signed)
Surgical Instructions    Your procedure is scheduled on Tuesday December 20th.  Report to [facility] Main Entrance "A" at 7 A.M., then check in with the Admitting office.  Call this number if you have problems the morning of surgery:  [phone]   If you have any questions prior to your surgery date call [phone]: Open Monday-Friday 8am-4pm    Remember:  Do not eat or drink anything after midnight the night before your surgery     Take these medicines the morning of surgery with A SIP OF WATER amLODipine (NORVASC) 10 MG tablet pantoprazole (PROTONIX) 40 MG tablet   IF NEEDED  Oxycodone HCl 10 MG TABS  Per Dr. Percival Spanish, recommends holding Eliquis 3 days prior to surgery  As of today, STOP taking any Aspirin (unless otherwise instructed by your surgeon) Aleve, Naproxen, Ibuprofen, Motrin, Advil, Goody's, BC's, all herbal medications, fish oil, and all vitamins.     After your COVID test   You are not required to quarantine however you are required to wear a well-fitting mask when you are out and around people not in your household.  If your mask becomes wet or soiled, replace with a new one.  Wash your hands often with soap and water for 20 seconds or clean your hands with an alcohol-based hand sanitizer that contains at least 60% alcohol.  Do not share personal items.  Notify your provider: if you are in close contact with someone who has COVID  or if you develop a fever of 100.4 or greater, sneezing, cough, sore throat, shortness of breath or body aches.             Do not wear jewelry or makeup Do not wear lotions, powders, colognes, or deodorant. Do not shave 48 hours prior to surgery.  Men may shave face and neck. Do not bring valuables to the hospital. DO Not wear nail polish, gel polish, artificial nails, or any other type of covering on natural nails including finger and toenails. If patients have artificial nails, gel coating, etc. that need to be  removed by a nail salon, please have this removed prior to surgery or surgery may need to be canceled/delayed if the surgeon/ anesthesia feels like the patient is unable to be adequately monitored.             [facility] is not responsible for any belongings or valuables.  Do NOT Smoke (Tobacco/Vaping)  24 hours prior to your procedure  If you use a CPAP at night, you may bring your mask for your overnight stay.   Contacts, glasses, hearing aids, dentures or partials may not be worn into surgery, please bring cases for these belongings   For patients admitted to the hospital, discharge time will be determined by your treatment team.   Patients discharged the day of surgery will not be allowed to drive home, and someone needs to stay with them for 24 hours.  NO VISITORS WILL BE ALLOWED IN PRE-OP WHERE PATIENTS ARE PREPPED FOR SURGERY.  ONLY 1 SUPPORT PERSON MAY BE PRESENT IN THE WAITING ROOM WHILE YOU ARE IN SURGERY.  IF YOU ARE TO BE ADMITTED, ONCE YOU ARE IN YOUR ROOM YOU WILL BE ALLOWED TWO (2) VISITORS. 1 (ONE) VISITOR MAY STAY OVERNIGHT BUT MUST ARRIVE TO THE ROOM BY 8pm.  Minor children may have two parents present. Special consideration for safety and communication needs will be reviewed on a case by case basis.  Special instructions:  Oral Hygiene is also important to reduce your risk of infection.  Remember - BRUSH YOUR TEETH THE MORNING OF SURGERY WITH YOUR REGULAR TOOTHPASTE   [facility]- Preparing For Surgery  Before surgery, you can play an important role. Because skin is not sterile, your skin needs to be as free of germs as possible. You can reduce the number of germs on your skin by washing with CHG (chlorahexidine gluconate) Soap before surgery.  CHG is an antiseptic cleaner which kills germs and bonds with the skin to continue killing germs even after washing.     Please do not use if you have an allergy to CHG or antibacterial soaps. If your skin becomes  reddened/irritated stop using the CHG.  Do not shave (including legs and underarms) for at least 48 hours prior to first CHG shower. It is OK to shave your face.  Please follow these instructions carefully.     Shower the NIGHT BEFORE SURGERY and the MORNING OF SURGERY with CHG Soap.   If you chose to wash your hair, wash your hair first as usual with your normal shampoo. After you shampoo, rinse your hair and body thoroughly to remove the shampoo.  Then [employer] and genitals (private parts) with your normal soap and rinse thoroughly to remove soap.  After that Use CHG Soap as you would any other liquid soap. You can apply CHG directly to the skin and wash gently with a scrungie or a clean washcloth.   Apply the CHG Soap to your body ONLY FROM THE NECK DOWN.  Do not use on open wounds or open sores. Avoid contact with your eyes, ears, mouth and genitals (private parts). Wash Face and genitals (private parts)  with your normal soap.   Wash thoroughly, paying special attention to the area where your surgery will be performed.  Thoroughly rinse your body with warm water from the neck down.  DO NOT shower/wash with your normal soap after using and rinsing off the CHG Soap.  Pat yourself dry with a CLEAN TOWEL.  Wear CLEAN PAJAMAS to bed the night before surgery  Place CLEAN SHEETS on your bed the night before your surgery  DO NOT SLEEP WITH PETS.   Day of Surgery:  Take a shower with CHG soap. Wear Clean/Comfortable clothing the morning of surgery Do not apply any deodorants/lotions.   Remember to brush your teeth WITH YOUR REGULAR TOOTHPASTE.   Please read over the following fact sheets that you were given.

## 2021-04-28 ENCOUNTER — Encounter (HOSPITAL_COMMUNITY): Payer: Self-pay

## 2021-04-28 ENCOUNTER — Other Ambulatory Visit: Payer: Self-pay

## 2021-04-28 ENCOUNTER — Encounter (HOSPITAL_COMMUNITY)
Admission: RE | Admit: 2021-04-28 | Discharge: 2021-04-28 | Disposition: A | Discharge status: Home / Self Care | Payer: PPO | Source: Ambulatory Visit | Attending: Neurological Surgery | Admitting: Neurological Surgery

## 2021-04-28 VITALS — BP 130/68 | HR 94 | Temp 98.2°F | Resp 17 | Ht 66.0 in | Wt 177.0 lb

## 2021-04-28 DIAGNOSIS — Z01818 Encounter for other preprocedural examination: Secondary | ICD-10-CM | POA: Insufficient documentation

## 2021-04-28 HISTORY — DX: Cardiac arrhythmia, unspecified: I49.9

## 2021-04-28 LAB — CBC
HCT: 42 % (ref 39.0–52.0)
Hemoglobin: 13.9 g/dL (ref 13.0–17.0)
MCH: 31.3 pg (ref 26.0–34.0)
MCHC: 33.1 g/dL (ref 30.0–36.0)
MCV: 94.6 fL (ref 80.0–100.0)
Platelets: 324 10*3/uL (ref 150–400)
RBC: 4.44 MIL/uL (ref 4.22–5.81)
RDW: 12.7 % (ref 11.5–15.5)
WBC: 6.2 10*3/uL (ref 4.0–10.5)
nRBC: 0 % (ref 0.0–0.2)

## 2021-04-28 LAB — TYPE AND SCREEN
ABO/RH(D): A POS
Antibody Screen: NEGATIVE

## 2021-04-28 LAB — SURGICAL PCR SCREEN
MRSA, PCR: NEGATIVE
Staphylococcus aureus: NEGATIVE

## 2021-04-28 NOTE — Progress Notes (Signed)
PCP - [name], MD Cardiologist - Minus Breeding, MD  PPM/ICD - denies Device Orders - n/a Rep Notified - n/a  Chest x-ray - n/a EKG - 04/28/2021 Stress Test - 11/01/2018 ECHO - 11/06/2020 Cardiac Cath - 2008  Sleep Study - denies CPAP - n/a  Fasting Blood Sugar - n/a  Blood Thinner Instructions: Eliquis - last dose - 05/02/2021  Aspirin Instructions: Patient was instructed: As of today, STOP taking any Aspirin (unless otherwise instructed by your surgeon) Aleve, Naproxen, Ibuprofen, Motrin, Advil, Goody's, BC's, all herbal medications, fish oil, and all vitamins  ERAS Protcol - n/a   COVID TEST- patient will have the test done the day of surgery. Patient lives in [place] and verbalized that it is hard for him to come Monday for test and Tuesday for surgery.   Anesthesia review: yes - cardiac history  Patient denies shortness of breath, fever, cough and chest pain at PAT appointment   All instructions explained to the patient, with a verbal understanding of the material. Patient agrees to go over the instructions while at home for a better understanding. Patient also instructed to self quarantine after being tested for COVID-19. The opportunity to ask questions was provided.

## 2021-04-28 NOTE — Progress Notes (Signed)
Surgical Instructions    Your procedure is scheduled on Tuesday December 20th.  Report to [facility] Main Entrance "A" at 06:30 A.M., then check in with the Admitting office.  Call this number if you have problems the morning of surgery:  [phone]   If you have any questions prior to your surgery date call [phone]: Open Monday-Friday 8am-4pm    Remember:  Do not eat or drink anything after midnight the night before your surgery     Take these medicines the morning of surgery with A SIP OF WATER amLODipine (NORVASC) 10 MG tablet pantoprazole (PROTONIX) 40 MG tablet   IF NEEDED  Oxycodone HCl 10 MG TABS  Per Dr. Percival Spanish, recommends holding Eliquis 3 days prior to surgery  As of today, STOP taking any Aspirin (unless otherwise instructed by your surgeon) Aleve, Naproxen, Ibuprofen, Motrin, Advil, Goody's, BC's, all herbal medications, fish oil, and all vitamins.     After your COVID test   You are not required to quarantine however you are required to wear a well-fitting mask when you are out and around people not in your household.  If your mask becomes wet or soiled, replace with a new one.  Wash your hands often with soap and water for 20 seconds or clean your hands with an alcohol-based hand sanitizer that contains at least 60% alcohol.  Do not share personal items.  Notify your provider: if you are in close contact with someone who has COVID  or if you develop a fever of 100.4 or greater, sneezing, cough, sore throat, shortness of breath or body aches.             Do not wear jewelry or makeup Do not wear lotions, powders, colognes, or deodorant. Do not shave 48 hours prior to surgery.  Men may shave face and neck. Do not bring valuables to the hospital. DO Not wear nail polish, gel polish, artificial nails, or any other type of covering on natural nails including finger and toenails. If patients have artificial nails, gel coating, etc. that need to be  removed by a nail salon, please have this removed prior to surgery or surgery may need to be canceled/delayed if the surgeon/ anesthesia feels like the patient is unable to be adequately monitored.             [facility] is not responsible for any belongings or valuables.  Do NOT Smoke (Tobacco/Vaping)  24 hours prior to your procedure  If you use a CPAP at night, you may bring your mask for your overnight stay.   Contacts, glasses, hearing aids, dentures or partials may not be worn into surgery, please bring cases for these belongings   For patients admitted to the hospital, discharge time will be determined by your treatment team.   Patients discharged the day of surgery will not be allowed to drive home, and someone needs to stay with them for 24 hours.  NO VISITORS WILL BE ALLOWED IN PRE-OP WHERE PATIENTS ARE PREPPED FOR SURGERY.  ONLY 1 SUPPORT PERSON MAY BE PRESENT IN THE WAITING ROOM WHILE YOU ARE IN SURGERY.  IF YOU ARE TO BE ADMITTED, ONCE YOU ARE IN YOUR ROOM YOU WILL BE ALLOWED TWO (2) VISITORS. 1 (ONE) VISITOR MAY STAY OVERNIGHT BUT MUST ARRIVE TO THE ROOM BY 8pm.  Minor children may have two parents present. Special consideration for safety and communication needs will be reviewed on a case by case basis.  Special instructions:  Oral Hygiene is also important to reduce your risk of infection.  Remember - BRUSH YOUR TEETH THE MORNING OF SURGERY WITH YOUR REGULAR TOOTHPASTE   [facility]- Preparing For Surgery  Before surgery, you can play an important role. Because skin is not sterile, your skin needs to be as free of germs as possible. You can reduce the number of germs on your skin by washing with CHG (chlorahexidine gluconate) Soap before surgery.  CHG is an antiseptic cleaner which kills germs and bonds with the skin to continue killing germs even after washing.     Please do not use if you have an allergy to CHG or antibacterial soaps. If your skin becomes  reddened/irritated stop using the CHG.  Do not shave (including legs and underarms) for at least 48 hours prior to first CHG shower. It is OK to shave your face.  Please follow these instructions carefully.     Shower the NIGHT BEFORE SURGERY and the MORNING OF SURGERY with CHG Soap.   If you chose to wash your hair, wash your hair first as usual with your normal shampoo. After you shampoo, rinse your hair and body thoroughly to remove the shampoo.  Then [employer] and genitals (private parts) with your normal soap and rinse thoroughly to remove soap.  After that Use CHG Soap as you would any other liquid soap. You can apply CHG directly to the skin and wash gently with a scrungie or a clean washcloth.   Apply the CHG Soap to your body ONLY FROM THE NECK DOWN.  Do not use on open wounds or open sores. Avoid contact with your eyes, ears, mouth and genitals (private parts). Wash Face and genitals (private parts)  with your normal soap.   Wash thoroughly, paying special attention to the area where your surgery will be performed.  Thoroughly rinse your body with warm water from the neck down.  DO NOT shower/wash with your normal soap after using and rinsing off the CHG Soap.  Pat yourself dry with a CLEAN TOWEL.  Wear CLEAN PAJAMAS to bed the night before surgery  Place CLEAN SHEETS on your bed the night before your surgery  DO NOT SLEEP WITH PETS.   Day of Surgery:  Take a shower with CHG soap. Wear Clean/Comfortable clothing the morning of surgery Do not apply any deodorants/lotions.   Remember to brush your teeth WITH YOUR REGULAR TOOTHPASTE.   Please read over the following fact sheets that you were given.

## 2021-04-29 NOTE — Progress Notes (Signed)
Anesthesia Chart Review:  Case: [id number] Date/Time: 05/06/21 0845   Procedure: TLIF - T12-L1 fixation T9 to L1  with methacrylate augmentation   Anesthesia type: General   Pre-op diagnosis: Radiculopathy   Location: [facility] ROOM 20 / [facility]   Surgeons: Kristeen Miss, MD       DISCUSSION: Patient is an 80 year old male scheduled for the above procedure.  History includes former smoker (quit 03/30/69), PAF (diagnosed ~ 2006; recurrent 05/2006, s/p radiofrequency ablation 01/24/10), DVT (LLE 03/2007), PE (03/2007), HTN, HLD, hyperglycemia, GERD, glaucoma, skin cancer, chronic back pain, spinal surgery (C4-7 ACDF 09/25/99 with removal of C3-6 plate 01/29/05; G6-4 diskectomy 08/10/01; C3-4 & C7-T1 posterolateral arthrodesis 09/01/05; L5-S1 decompression, pedical screw fixation L2-L5 to include sacrum 09/18/10; removal of L2-sacrum fixation and S1 screws 04/24/14).  Reported "waking up" during hemorrhoid surgery.    Last cardiology visit with Dr. Percival Spanish on 04/25/2021.  He was seen for preoperative evaluation. He wrote, "PAF:  [name] has a CHA2DS2 - VASc score of  4.  He can hold his Eliquis 3 days prior to his back surgery.   PREOP: The patient has no high risk findings.  He has a functional level above 5 METS.  His surgery is not high risk but probably moderate risk.  Cording to ACC/AHA guidelines he is an acceptable risk for the planned procedure." Per patient, last Eliquis plan for 05/02/2021.  Posting indicates COVID-19 testing on the day of surgery due to living in [place] and difficulty getting transportation to [place]. Anesthesia team to evaluate on the day of surgery.   VS: BP 130/68    Pulse 94    Temp 36.8 C (Oral)    Resp 17    Ht 5\' 6"  (1.676 m)    Wt 80.3 kg    SpO2 97%    BMI 28.57 kg/m    PROVIDERS: Kathyrn Drown, MD is PCP  Minus Breeding, MD is cardiologist Thompson Grayer, MD is EP. Followed in the [facility] by Roderic Palau, NP, last visit 09/18/20.    LABS: Labs  reviewed: Acceptable for surgery. (all labs ordered are listed, but only abnormal results are displayed)  Labs Reviewed  SURGICAL PCR SCREEN  CBC  TYPE AND SCREEN   BMET on 04/18/21 was normal except glucose 101. LFTs normal 10/17/20.    IMAGES: CT C/T/L spine with myelogram 04/02/21: IMPRESSION: 1. Symptomatic level favored to be the T12-L1 adjacent segment, where pronounced vacuum disc is new since the thoracic MRI last year but was present on a June CT. Moderate multifactorial spinal stenosis there, with a relative myelographic block there in the fluoroscopy suite. Some mass effect on the lower thoracic spinal cord which is one level above the conus. And moderate to severe bilateral T12 neural foraminal stenosis. 2. But there is also advanced lower thoracic spine degeneration T10-T11 and T11-T12 with progression since a 2012 thoracic myelogram. New mild spinal stenosis at both levels, with probable mild cord mass effect at T11-T12. And moderate to severe bilateral T9 through T11 neural foraminal stenosis. 3. Superimposed chronic L1 through through sacral hardware fusion. No adverse hardware features. Solid arthrodesis L1 through L5, and probably scant interbody arthrodesis at L5-S1. 4. Superimposed prior cervical spine fusion C2 through T1. Solid arthrodesis C2 through C7, but pseudoarthrosis at C7-T1. However, no hardware loosening. No cervical spinal stenosis suspected (minimal cervical intrathecal contrast). Mild to moderate C8 neural foraminal stenosis. 5. Advanced upper thoracic degeneration T1-T2 and T2-T3 also although no definite  upper thoracic spinal stenosis. There is moderate T1 and T2 foraminal stenosis.    EKG:  EKG 04/28/21: Normal sinus rhythm Inferior infarct , age undetermined Poor R wave progression Cannot rule out Anterior infarct , age undetermined Abnormal ECG No significant change since 09/18/2020 Confirmed by Adrian Prows ([id number]) on 04/29/2021  7:27:30 AM  EKG 04/25/21: Per. Dr. Percival Spanish, "The ekg ordered today demonstrates atrial fibrillation, rate 84, premature ectopic complexes, T wave inversions in the inferior leads unchanged from previous." - There does appear to be p waves in V1.    CV: Echo 11/07/18: IMPRESSIONS   1. The left ventricle has normal systolic function with an ejection  fraction of 60-65%. The cavity size was normal. Left ventricular diastolic  parameters were normal.   2. The right ventricle has normal systolic function. The cavity was  normal. There is no increase in right ventricular wall thickness.   3. Mild thickening of the mitral valve leaflet. Mild calcification of the  mitral valve leaflet. There is mild mitral annular calcification present.   4. The aortic valve is tricuspid. Moderate thickening of the aortic  valve. Sclerosis without any evidence of stenosis of the aortic valve.  Aortic valve regurgitation is trivial by color flow Doppler.    Nuclear stress test 11/01/18: Nuclear stress EF: 63%. No wall motion abnormalities. There was no ST segment deviation noted during stress. Defect 1: There is a small defect of mild severity present in the apical inferior and apex location. Findings consistent with prior myocardial infarction with peri-infarct ischemia. This is a low risk study. No high risk ischemia noted.    Long term cardiac monitor 10/25/18-10/31/18: Sinus rhythm Rare premature atrial contractions and rare premature ventricular contractions Nonsustained supraventricular tachycardia No afib No sustained arrhythmias - Consider long term monitoring with an implantable loop recorder which may be a better options for afib management post ablation   Past Medical History:  Diagnosis Date   Arthritis    Cancer ([place])    skin   Carpal tunnel syndrome    Cataract    left eye   Chronic back pain    multiple back surgeries   Complication of anesthesia    pt states woke up during hemorroid  surgery   DVT (deep venous thrombosis) ([place]) 03/2007   left leg    Dysrhythmia    GERD (gastroesophageal reflux disease)    takes Protonix daily   Glaucoma    History of colon polyps    History of hyperglycemia    History of MRSA infection 1992   HTN (hypertension)    takes  Ramipril daily   Hyperlipidemia    takes Pravastatin 3 times a week   Nocturia    Paroxysmal atrial fibrillation (HCC)    takes Pradaxa daily, Chads2vasc score 2(07/11/14)   Pneumonia    many yrs ago   Pulmonary embolism ([place]) 2008    Past Surgical History:  Procedure Laterality Date   BACK SURGERY     BIOPSY  04/01/2018   Procedure: BIOPSY;  Surgeon: Rogene Houston, MD;  [facility];  Service: Endoscopy;;  gastric   CARDIAC CATHETERIZATION  05/18/2006   CARPAL TUNNEL RELEASE Right    cataract surgery Right    cervical and lumbar surgery     COLONOSCOPY  04/24/2011   Procedure: COLONOSCOPY;  Surgeon: Rogene Houston, MD;  [facility];  Service: Endoscopy;  Laterality: N/A;  1200   COLONOSCOPY N/A 08/13/2016   Procedure: COLONOSCOPY;  Surgeon: Rogene Houston, MD;  [facility];  Service: Endoscopy;  Laterality: N/A;  1200   ESOPHAGOGASTRODUODENOSCOPY  03/10/2012   Procedure: ESOPHAGOGASTRODUODENOSCOPY (EGD);  Surgeon: Rogene Houston, MD;  [facility];  Service: Endoscopy;  Laterality: N/A;  325   ESOPHAGOGASTRODUODENOSCOPY (EGD) WITH PROPOFOL N/A 04/01/2018   Procedure: ESOPHAGOGASTRODUODENOSCOPY (EGD) WITH PROPOFOL;  Surgeon: Rogene Houston, MD;  [facility];  Service: Endoscopy;  Laterality: N/A;   EYE SURGERY     HARDWARE REMOVAL N/A 04/24/2014   Procedure: REMOVAL OF SACRAL INSTRUMENTATION WITH [place];  Surgeon: Kristeen Miss, MD;  Location: [facility];  Service: Neurosurgery;  Laterality: N/A;   HEMORRHOIDECTOMY WITH HEMORRHOID BANDING     JOINT REPLACEMENT     KNEE ARTHROSCOPY Right    KNEE ARTHROSCOPY WITH MEDIAL MENISECTOMY Right  08/03/2018   Procedure: RIGHT KNEE ARTHROSCOPY WITH PARTIAL MEDIAL MENISCECTOMY;  Surgeon: Leandrew Koyanagi, MD;  Location: [facility];  Service: Orthopedics;  Laterality: Right;   KNEE SURGERY     left   LASIK     POLYPECTOMY  08/13/2016   Procedure: POLYPECTOMY;  Surgeon: Rogene Houston, MD;  [facility];  Service: Endoscopy;;  colon   PVI  01/24/2010   afib ablation    MEDICATIONS:  amLODipine (NORVASC) 10 MG tablet   diphenhydrAMINE-APAP, sleep, (TYLENOL PM EXTRA STRENGTH PO)   ELIQUIS 5 MG TABS tablet   indapamide (LOZOL) 2.5 MG tablet   latanoprost (XALATAN) 0.005 % ophthalmic solution   Oxycodone HCl 10 MG TABS   pantoprazole (PROTONIX) 40 MG tablet   polyethylene glycol powder (GLYCOLAX/MIRALAX) 17 GM/SCOOP powder   pravastatin (PRAVACHOL) 80 MG tablet   psyllium (METAMUCIL SMOOTH TEXTURE) 58.6 % powder   ramipril (ALTACE) 10 MG capsule   No current facility-administered medications for this encounter.    Myra Gianotti, PA-C Surgical Short Stay/Anesthesiology [facility] Phone [phone] [facility] Phone [phone] 04/29/2021 6:17 PM

## 2021-04-30 ENCOUNTER — Other Ambulatory Visit: Payer: Self-pay | Admitting: Family Medicine

## 2021-05-01 NOTE — Progress Notes (Signed)
No covid test DOS.   Per progress notes on 02/20/21, patient tested positive for covid at that time.

## 2021-05-02 ENCOUNTER — Other Ambulatory Visit (HOSPITAL_COMMUNITY): Payer: PPO

## 2021-05-06 ENCOUNTER — Inpatient Hospital Stay (HOSPITAL_COMMUNITY)
Admission: RE | Admit: 2021-05-06 | Discharge: 2021-05-09 | DRG: 454 | Disposition: A | Discharge status: Home Health | Payer: PPO | Attending: Neurological Surgery | Admitting: Neurological Surgery

## 2021-05-06 ENCOUNTER — Encounter (HOSPITAL_COMMUNITY): Payer: Self-pay | Admitting: Neurological Surgery

## 2021-05-06 ENCOUNTER — Inpatient Hospital Stay (HOSPITAL_COMMUNITY)
Admission: RE | Disposition: A | Discharge status: Home Health | Payer: Self-pay | Source: Home / Self Care | Attending: Neurological Surgery

## 2021-05-06 ENCOUNTER — Inpatient Hospital Stay (HOSPITAL_COMMUNITY): Payer: PPO | Admitting: Certified Registered"

## 2021-05-06 ENCOUNTER — Inpatient Hospital Stay (HOSPITAL_COMMUNITY): Payer: PPO

## 2021-05-06 ENCOUNTER — Inpatient Hospital Stay (HOSPITAL_COMMUNITY): Payer: PPO | Admitting: Vascular Surgery

## 2021-05-06 DIAGNOSIS — M47816 Spondylosis without myelopathy or radiculopathy, lumbar region: Secondary | ICD-10-CM | POA: Diagnosis not present

## 2021-05-06 DIAGNOSIS — Z86718 Personal history of other venous thrombosis and embolism: Secondary | ICD-10-CM | POA: Diagnosis not present

## 2021-05-06 DIAGNOSIS — M1990 Unspecified osteoarthritis, unspecified site: Secondary | ICD-10-CM | POA: Diagnosis not present

## 2021-05-06 DIAGNOSIS — Z86010 Personal history of colonic polyps: Secondary | ICD-10-CM

## 2021-05-06 DIAGNOSIS — Z8701 Personal history of pneumonia (recurrent): Secondary | ICD-10-CM

## 2021-05-06 DIAGNOSIS — M47815 Spondylosis without myelopathy or radiculopathy, thoracolumbar region: Secondary | ICD-10-CM | POA: Diagnosis present

## 2021-05-06 DIAGNOSIS — M4805 Spinal stenosis, thoracolumbar region: Principal | ICD-10-CM | POA: Diagnosis present

## 2021-05-06 DIAGNOSIS — H409 Unspecified glaucoma: Secondary | ICD-10-CM | POA: Diagnosis present

## 2021-05-06 DIAGNOSIS — I10 Essential (primary) hypertension: Secondary | ICD-10-CM | POA: Diagnosis not present

## 2021-05-06 DIAGNOSIS — Z981 Arthrodesis status: Secondary | ICD-10-CM | POA: Diagnosis not present

## 2021-05-06 DIAGNOSIS — Z8614 Personal history of Methicillin resistant Staphylococcus aureus infection: Secondary | ICD-10-CM | POA: Diagnosis not present

## 2021-05-06 DIAGNOSIS — G9520 Unspecified cord compression: Secondary | ICD-10-CM | POA: Diagnosis not present

## 2021-05-06 DIAGNOSIS — Z882 Allergy status to sulfonamides status: Secondary | ICD-10-CM

## 2021-05-06 DIAGNOSIS — Z7901 Long term (current) use of anticoagulants: Secondary | ICD-10-CM

## 2021-05-06 DIAGNOSIS — E785 Hyperlipidemia, unspecified: Secondary | ICD-10-CM | POA: Diagnosis present

## 2021-05-06 DIAGNOSIS — K219 Gastro-esophageal reflux disease without esophagitis: Secondary | ICD-10-CM | POA: Diagnosis not present

## 2021-05-06 DIAGNOSIS — Z419 Encounter for procedure for purposes other than remedying health state, unspecified: Secondary | ICD-10-CM

## 2021-05-06 DIAGNOSIS — Z888 Allergy status to other drugs, medicaments and biological substances status: Secondary | ICD-10-CM

## 2021-05-06 DIAGNOSIS — M48062 Spinal stenosis, lumbar region with neurogenic claudication: Secondary | ICD-10-CM | POA: Diagnosis not present

## 2021-05-06 DIAGNOSIS — I480 Paroxysmal atrial fibrillation: Secondary | ICD-10-CM | POA: Diagnosis present

## 2021-05-06 DIAGNOSIS — M48061 Spinal stenosis, lumbar region without neurogenic claudication: Secondary | ICD-10-CM | POA: Diagnosis not present

## 2021-05-06 DIAGNOSIS — M4155 Other secondary scoliosis, thoracolumbar region: Secondary | ICD-10-CM | POA: Diagnosis present

## 2021-05-06 DIAGNOSIS — Z79899 Other long term (current) drug therapy: Secondary | ICD-10-CM

## 2021-05-06 DIAGNOSIS — M4726 Other spondylosis with radiculopathy, lumbar region: Secondary | ICD-10-CM | POA: Diagnosis not present

## 2021-05-06 DIAGNOSIS — G8929 Other chronic pain: Secondary | ICD-10-CM | POA: Diagnosis not present

## 2021-05-06 DIAGNOSIS — M5415 Radiculopathy, thoracolumbar region: Secondary | ICD-10-CM | POA: Diagnosis present

## 2021-05-06 DIAGNOSIS — Z79891 Long term (current) use of opiate analgesic: Secondary | ICD-10-CM | POA: Diagnosis not present

## 2021-05-06 DIAGNOSIS — Z86711 Personal history of pulmonary embolism: Secondary | ICD-10-CM

## 2021-05-06 DIAGNOSIS — M549 Dorsalgia, unspecified: Secondary | ICD-10-CM | POA: Diagnosis present

## 2021-05-06 DIAGNOSIS — M4326 Fusion of spine, lumbar region: Secondary | ICD-10-CM | POA: Diagnosis not present

## 2021-05-06 DIAGNOSIS — I700 Atherosclerosis of aorta: Secondary | ICD-10-CM | POA: Diagnosis not present

## 2021-05-06 DIAGNOSIS — L905 Scar conditions and fibrosis of skin: Secondary | ICD-10-CM | POA: Diagnosis present

## 2021-05-06 DIAGNOSIS — Z8249 Family history of ischemic heart disease and other diseases of the circulatory system: Secondary | ICD-10-CM | POA: Diagnosis not present

## 2021-05-06 DIAGNOSIS — Z87891 Personal history of nicotine dependence: Secondary | ICD-10-CM

## 2021-05-06 DIAGNOSIS — M4186 Other forms of scoliosis, lumbar region: Secondary | ICD-10-CM | POA: Diagnosis not present

## 2021-05-06 DIAGNOSIS — M4156 Other secondary scoliosis, lumbar region: Secondary | ICD-10-CM | POA: Diagnosis not present

## 2021-05-06 HISTORY — PX: TRANSFORAMINAL LUMBAR INTERBODY FUSION (TLIF) WITH PEDICLE SCREW FIXATION 1 LEVEL: SHX6141

## 2021-05-06 HISTORY — PX: APPLICATION OF ROBOTIC ASSISTANCE FOR SPINAL PROCEDURE: SHX6753

## 2021-05-06 SURGERY — TRANSFORAMINAL LUMBAR INTERBODY FUSION (TLIF) WITH PEDICLE SCREW FIXATION 1 LEVEL
Anesthesia: General

## 2021-05-06 MED ORDER — PANTOPRAZOLE SODIUM 40 MG PO TBEC
40.0000 mg | DELAYED_RELEASE_TABLET | Freq: Every day | ORAL | Status: DC
  Administered 2021-05-07 – 2021-05-09 (×3): 40 mg via ORAL
  Filled 2021-05-06 (×3): qty 1

## 2021-05-06 MED ORDER — PROPOFOL 10 MG/ML IV BOLUS
INTRAVENOUS | Status: AC
  Filled 2021-05-06: qty 20

## 2021-05-06 MED ORDER — LIDOCAINE-EPINEPHRINE 1 %-1:100000 IJ SOLN
INTRAMUSCULAR | Status: AC
  Filled 2021-05-06: qty 1

## 2021-05-06 MED ORDER — ONDANSETRON HCL 4 MG/2ML IJ SOLN: 4.0000 mg | Freq: Four times a day (QID) | INTRAMUSCULAR | Status: DC | PRN

## 2021-05-06 MED ORDER — HEMOSTATIC AGENTS (NO CHARGE) OPTIME
TOPICAL | Status: DC | PRN
  Administered 2021-05-06: 1 via TOPICAL

## 2021-05-06 MED ORDER — BUPIVACAINE HCL (PF) 0.5 % IJ SOLN
INTRAMUSCULAR | Status: DC | PRN
  Administered 2021-05-06: 5 mL

## 2021-05-06 MED ORDER — ONDANSETRON HCL 4 MG PO TABS: 4.0000 mg | ORAL_TABLET | Freq: Four times a day (QID) | ORAL | Status: DC | PRN

## 2021-05-06 MED ORDER — LIDOCAINE 2% (20 MG/ML) 5 ML SYRINGE
INTRAMUSCULAR | Status: AC
  Filled 2021-05-06: qty 5

## 2021-05-06 MED ORDER — ROCURONIUM BROMIDE 10 MG/ML (PF) SYRINGE
PREFILLED_SYRINGE | INTRAVENOUS | Status: AC
  Filled 2021-05-06: qty 10

## 2021-05-06 MED ORDER — HYDROMORPHONE HCL 1 MG/ML IJ SOLN
INTRAMUSCULAR | Status: DC | PRN
  Administered 2021-05-06 (×2): .25 mg via INTRAVENOUS

## 2021-05-06 MED ORDER — FENTANYL CITRATE (PF) 250 MCG/5ML IJ SOLN
INTRAMUSCULAR | Status: AC
  Filled 2021-05-06: qty 5

## 2021-05-06 MED ORDER — DOCUSATE SODIUM 100 MG PO CAPS
100.0000 mg | ORAL_CAPSULE | Freq: Two times a day (BID) | ORAL | Status: DC
  Administered 2021-05-07 – 2021-05-09 (×6): 100 mg via ORAL
  Filled 2021-05-06 (×6): qty 1

## 2021-05-06 MED ORDER — LIDOCAINE 2% (20 MG/ML) 5 ML SYRINGE
INTRAMUSCULAR | Status: DC | PRN
  Administered 2021-05-06: 80 mg via INTRAVENOUS

## 2021-05-06 MED ORDER — INDAPAMIDE 2.5 MG PO TABS
2.5000 mg | ORAL_TABLET | Freq: Every day | ORAL | Status: DC
  Administered 2021-05-07 – 2021-05-09 (×3): 2.5 mg via ORAL
  Filled 2021-05-06 (×3): qty 1

## 2021-05-06 MED ORDER — DIPHENHYDRAMINE-APAP (SLEEP) 25-500 MG PO TABS: 1.0000 | ORAL_TABLET | Freq: Every evening | ORAL | Status: DC | PRN

## 2021-05-06 MED ORDER — SODIUM CHLORIDE 0.9 % IV SOLN: 250.0000 mL | INTRAVENOUS | Status: DC

## 2021-05-06 MED ORDER — THROMBIN 5000 UNITS EX SOLR
CUTANEOUS | Status: AC
  Filled 2021-05-06: qty 5000

## 2021-05-06 MED ORDER — PHENYLEPHRINE 40 MCG/ML (10ML) SYRINGE FOR IV PUSH (FOR BLOOD PRESSURE SUPPORT)
PREFILLED_SYRINGE | INTRAVENOUS | Status: DC | PRN
  Administered 2021-05-06 (×2): 80 ug via INTRAVENOUS
  Administered 2021-05-06 (×2): 40 ug via INTRAVENOUS

## 2021-05-06 MED ORDER — KETOROLAC TROMETHAMINE 0.5 % OP SOLN
1.0000 [drp] | Freq: Three times a day (TID) | OPHTHALMIC | Status: DC | PRN
  Administered 2021-05-06: 19:00:00 1 [drp] via OPHTHALMIC

## 2021-05-06 MED ORDER — DEXAMETHASONE SODIUM PHOSPHATE 10 MG/ML IJ SOLN
INTRAMUSCULAR | Status: AC
  Filled 2021-05-06: qty 1

## 2021-05-06 MED ORDER — OXYCODONE HCL 5 MG/5ML PO SOLN: 5.0000 mg | Freq: Once | ORAL | Status: DC | PRN

## 2021-05-06 MED ORDER — METHOCARBAMOL 1000 MG/10ML IJ SOLN
500.0000 mg | Freq: Four times a day (QID) | INTRAVENOUS | Status: DC | PRN
  Filled 2021-05-06: qty 5

## 2021-05-06 MED ORDER — PROPOFOL 10 MG/ML IV BOLUS
INTRAVENOUS | Status: DC | PRN
  Administered 2021-05-06: 150 mg via INTRAVENOUS

## 2021-05-06 MED ORDER — METHOCARBAMOL 500 MG PO TABS
500.0000 mg | ORAL_TABLET | Freq: Four times a day (QID) | ORAL | Status: DC | PRN
  Administered 2021-05-06 – 2021-05-09 (×8): 500 mg via ORAL
  Filled 2021-05-06 (×7): qty 1

## 2021-05-06 MED ORDER — METHOCARBAMOL 500 MG PO TABS
ORAL_TABLET | ORAL | Status: AC
  Filled 2021-05-06: qty 1

## 2021-05-06 MED ORDER — OXYCODONE HCL 5 MG PO TABS: 5.0000 mg | ORAL_TABLET | Freq: Once | ORAL | Status: DC | PRN

## 2021-05-06 MED ORDER — ONDANSETRON HCL 4 MG/2ML IJ SOLN
INTRAMUSCULAR | Status: AC
  Filled 2021-05-06: qty 2

## 2021-05-06 MED ORDER — LATANOPROST 0.005 % OP SOLN
1.0000 [drp] | Freq: Every day | OPHTHALMIC | Status: DC
  Administered 2021-05-07 – 2021-05-08 (×3): 1 [drp] via OPHTHALMIC
  Filled 2021-05-06: qty 2.5

## 2021-05-06 MED ORDER — PSYLLIUM 95 % PO PACK
1.0000 | PACK | Freq: Every day | ORAL | Status: DC
  Administered 2021-05-07 – 2021-05-09 (×3): 1 via ORAL
  Filled 2021-05-06 (×3): qty 1

## 2021-05-06 MED ORDER — 0.9 % SODIUM CHLORIDE (POUR BTL) OPTIME
TOPICAL | Status: DC | PRN
  Administered 2021-05-06: 10:00:00 2000 mL

## 2021-05-06 MED ORDER — PHENOL 1.4 % MT LIQD: 1.0000 | OROMUCOSAL | Status: DC | PRN

## 2021-05-06 MED ORDER — PHENYLEPHRINE HCL-NACL 20-0.9 MG/250ML-% IV SOLN
INTRAVENOUS | Status: DC | PRN
  Administered 2021-05-06: 20 ug/min via INTRAVENOUS

## 2021-05-06 MED ORDER — MENTHOL 3 MG MT LOZG: 1.0000 | LOZENGE | OROMUCOSAL | Status: DC | PRN

## 2021-05-06 MED ORDER — DIAZEPAM 5 MG/ML IJ SOLN
5.0000 mg | Freq: Once | INTRAMUSCULAR | Status: AC
  Administered 2021-05-06: 17:00:00 5 mg via INTRAVENOUS

## 2021-05-06 MED ORDER — ACETAMINOPHEN 325 MG PO TABS
650.0000 mg | ORAL_TABLET | ORAL | Status: DC | PRN
  Administered 2021-05-07 – 2021-05-09 (×7): 650 mg via ORAL
  Filled 2021-05-06 (×7): qty 2

## 2021-05-06 MED ORDER — PHENYLEPHRINE 40 MCG/ML (10ML) SYRINGE FOR IV PUSH (FOR BLOOD PRESSURE SUPPORT)
PREFILLED_SYRINGE | INTRAVENOUS | Status: AC
  Filled 2021-05-06: qty 10

## 2021-05-06 MED ORDER — BISACODYL 10 MG RE SUPP: 10.0000 mg | Freq: Every day | RECTAL | Status: DC | PRN

## 2021-05-06 MED ORDER — CEFAZOLIN SODIUM-DEXTROSE 2-4 GM/100ML-% IV SOLN
2.0000 g | INTRAVENOUS | Status: AC
  Administered 2021-05-06: 11:00:00 2 g via INTRAVENOUS
  Filled 2021-05-06: qty 100

## 2021-05-06 MED ORDER — CEFAZOLIN SODIUM-DEXTROSE 2-4 GM/100ML-% IV SOLN
2.0000 g | Freq: Three times a day (TID) | INTRAVENOUS | Status: AC
  Administered 2021-05-07 (×2): 2 g via INTRAVENOUS
  Filled 2021-05-06 (×2): qty 100

## 2021-05-06 MED ORDER — MORPHINE SULFATE (PF) 2 MG/ML IV SOLN
2.0000 mg | INTRAVENOUS | Status: DC | PRN
  Administered 2021-05-07: 02:00:00 2 mg via INTRAVENOUS
  Administered 2021-05-07: 08:00:00 4 mg via INTRAVENOUS
  Administered 2021-05-07: 05:00:00 2 mg via INTRAVENOUS
  Administered 2021-05-07 (×2): 4 mg via INTRAVENOUS
  Administered 2021-05-08: 02:00:00 2 mg via INTRAVENOUS
  Filled 2021-05-06: qty 2
  Filled 2021-05-06 (×3): qty 1
  Filled 2021-05-06: qty 2
  Filled 2021-05-06: qty 1
  Filled 2021-05-06: qty 2

## 2021-05-06 MED ORDER — THROMBIN 5000 UNITS EX SOLR
CUTANEOUS | Status: AC
  Filled 2021-05-06: qty 10000

## 2021-05-06 MED ORDER — ONDANSETRON HCL 4 MG/2ML IJ SOLN
INTRAMUSCULAR | Status: DC | PRN
  Administered 2021-05-06: 4 mg via INTRAVENOUS

## 2021-05-06 MED ORDER — THROMBIN 20000 UNITS EX SOLR
CUTANEOUS | Status: AC
  Filled 2021-05-06: qty 20000

## 2021-05-06 MED ORDER — SENNA 8.6 MG PO TABS
1.0000 | ORAL_TABLET | Freq: Two times a day (BID) | ORAL | Status: DC
  Administered 2021-05-07 – 2021-05-09 (×6): 8.6 mg via ORAL
  Filled 2021-05-06 (×6): qty 1

## 2021-05-06 MED ORDER — FENTANYL CITRATE (PF) 250 MCG/5ML IJ SOLN
INTRAMUSCULAR | Status: DC | PRN
  Administered 2021-05-06 (×2): 100 ug via INTRAVENOUS
  Administered 2021-05-06: 50 ug via INTRAVENOUS

## 2021-05-06 MED ORDER — DEXAMETHASONE SODIUM PHOSPHATE 10 MG/ML IJ SOLN
INTRAMUSCULAR | Status: DC | PRN
  Administered 2021-05-06: 10 mg via INTRAVENOUS

## 2021-05-06 MED ORDER — ROCURONIUM BROMIDE 10 MG/ML (PF) SYRINGE
PREFILLED_SYRINGE | INTRAVENOUS | Status: DC | PRN
  Administered 2021-05-06: 20 mg via INTRAVENOUS
  Administered 2021-05-06: 80 mg via INTRAVENOUS
  Administered 2021-05-06 (×2): 10 mg via INTRAVENOUS

## 2021-05-06 MED ORDER — HYDROMORPHONE HCL 1 MG/ML IJ SOLN
0.2500 mg | INTRAMUSCULAR | Status: DC | PRN
  Administered 2021-05-06 (×4): 0.5 mg via INTRAVENOUS

## 2021-05-06 MED ORDER — LACTATED RINGERS IV SOLN: INTRAVENOUS | Status: DC

## 2021-05-06 MED ORDER — ACETAMINOPHEN 10 MG/ML IV SOLN
INTRAVENOUS | Status: DC | PRN
  Administered 2021-05-06: 1000 mg via INTRAVENOUS

## 2021-05-06 MED ORDER — ACETAMINOPHEN 10 MG/ML IV SOLN
INTRAVENOUS | Status: AC
  Filled 2021-05-06: qty 100

## 2021-05-06 MED ORDER — ACETAMINOPHEN 650 MG RE SUPP: 650.0000 mg | RECTAL | Status: DC | PRN

## 2021-05-06 MED ORDER — SODIUM CHLORIDE 0.9% FLUSH: 3.0000 mL | INTRAVENOUS | Status: DC | PRN

## 2021-05-06 MED ORDER — KETOROLAC TROMETHAMINE 15 MG/ML IJ SOLN
15.0000 mg | Freq: Once | INTRAMUSCULAR | Status: AC
  Administered 2021-05-06: 17:00:00 15 mg via INTRAVENOUS

## 2021-05-06 MED ORDER — HYDROMORPHONE HCL 1 MG/ML IJ SOLN
INTRAMUSCULAR | Status: AC
  Filled 2021-05-06: qty 0.5

## 2021-05-06 MED ORDER — ALUM & MAG HYDROXIDE-SIMETH 200-200-20 MG/5ML PO SUSP: 30.0000 mL | Freq: Four times a day (QID) | ORAL | Status: DC | PRN

## 2021-05-06 MED ORDER — ENSURE ENLIVE PO LIQD
237.0000 mL | Freq: Two times a day (BID) | ORAL | Status: DC
  Administered 2021-05-07: 09:00:00 237 mL via ORAL

## 2021-05-06 MED ORDER — BUPIVACAINE HCL (PF) 0.5 % IJ SOLN
INTRAMUSCULAR | Status: AC
  Filled 2021-05-06: qty 30

## 2021-05-06 MED ORDER — AMISULPRIDE (ANTIEMETIC) 5 MG/2ML IV SOLN: 10.0000 mg | Freq: Once | INTRAVENOUS | Status: DC | PRN

## 2021-05-06 MED ORDER — OXYCODONE HCL 5 MG PO TABS
10.0000 mg | ORAL_TABLET | Freq: Four times a day (QID) | ORAL | Status: DC
  Administered 2021-05-06 – 2021-05-07 (×3): 10 mg via ORAL
  Filled 2021-05-06 (×2): qty 2

## 2021-05-06 MED ORDER — DIAZEPAM 5 MG/ML IJ SOLN
INTRAMUSCULAR | Status: AC
  Filled 2021-05-06: qty 2

## 2021-05-06 MED ORDER — CHLORHEXIDINE GLUCONATE CLOTH 2 % EX PADS: 6.0000 | MEDICATED_PAD | Freq: Once | CUTANEOUS | Status: DC

## 2021-05-06 MED ORDER — DIPHENHYDRAMINE HCL 25 MG PO CAPS: 25.0000 mg | ORAL_CAPSULE | Freq: Every evening | ORAL | Status: DC | PRN

## 2021-05-06 MED ORDER — SUGAMMADEX SODIUM 200 MG/2ML IV SOLN
INTRAVENOUS | Status: DC | PRN
  Administered 2021-05-06 (×2): 100 mg via INTRAVENOUS

## 2021-05-06 MED ORDER — KETOROLAC TROMETHAMINE 15 MG/ML IJ SOLN
INTRAMUSCULAR | Status: AC
  Filled 2021-05-06: qty 1

## 2021-05-06 MED ORDER — ACETAMINOPHEN 500 MG PO TABS: 500.0000 mg | ORAL_TABLET | Freq: Every evening | ORAL | Status: DC | PRN

## 2021-05-06 MED ORDER — HYDROMORPHONE HCL 1 MG/ML IJ SOLN
INTRAMUSCULAR | Status: AC
  Filled 2021-05-06: qty 1

## 2021-05-06 MED ORDER — PRAVASTATIN SODIUM 10 MG PO TABS
80.0000 mg | ORAL_TABLET | Freq: Every day | ORAL | Status: DC
  Administered 2021-05-07 – 2021-05-09 (×3): 80 mg via ORAL
  Filled 2021-05-06 (×3): qty 8

## 2021-05-06 MED ORDER — THROMBIN 5000 UNITS EX SOLR
OROMUCOSAL | Status: DC | PRN
  Administered 2021-05-06 (×2): 5 mL via TOPICAL

## 2021-05-06 MED ORDER — ORAL CARE MOUTH RINSE: 15.0000 mL | Freq: Once | OROMUCOSAL | Status: AC

## 2021-05-06 MED ORDER — RAMIPRIL 5 MG PO CAPS
10.0000 mg | ORAL_CAPSULE | Freq: Two times a day (BID) | ORAL | Status: DC
  Administered 2021-05-07 – 2021-05-09 (×6): 10 mg via ORAL
  Filled 2021-05-06 (×6): qty 2

## 2021-05-06 MED ORDER — OXYCODONE HCL 5 MG PO TABS
ORAL_TABLET | ORAL | Status: AC
  Filled 2021-05-06: qty 2

## 2021-05-06 MED ORDER — FLEET ENEMA 7-19 GM/118ML RE ENEM: 1.0000 | ENEMA | Freq: Once | RECTAL | Status: DC | PRN

## 2021-05-06 MED ORDER — POLYETHYLENE GLYCOL 3350 17 G PO PACK: 17.0000 g | PACK | Freq: Every day | ORAL | Status: DC | PRN

## 2021-05-06 MED ORDER — CHLORHEXIDINE GLUCONATE 0.12 % MT SOLN
15.0000 mL | Freq: Once | OROMUCOSAL | Status: AC
  Administered 2021-05-06: 07:00:00 15 mL via OROMUCOSAL
  Filled 2021-05-06: qty 15

## 2021-05-06 MED ORDER — PROMETHAZINE HCL 25 MG/ML IJ SOLN: 6.2500 mg | INTRAMUSCULAR | Status: DC | PRN

## 2021-05-06 MED ORDER — KETOROLAC TROMETHAMINE 0.5 % OP SOLN
OPHTHALMIC | Status: AC
  Filled 2021-05-06: qty 5

## 2021-05-06 MED ORDER — SODIUM CHLORIDE 0.9% FLUSH
3.0000 mL | Freq: Two times a day (BID) | INTRAVENOUS | Status: DC
  Administered 2021-05-07 – 2021-05-08 (×4): 3 mL via INTRAVENOUS

## 2021-05-06 MED ORDER — POLYETHYLENE GLYCOL 3350 17 G PO PACK
8.5000 g | PACK | ORAL | Status: DC
  Administered 2021-05-07 – 2021-05-09 (×2): 8.5 g via ORAL
  Filled 2021-05-06 (×3): qty 1

## 2021-05-06 MED ORDER — ORAL CARE MOUTH RINSE
15.0000 mL | Freq: Two times a day (BID) | OROMUCOSAL | Status: DC
  Administered 2021-05-07 – 2021-05-08 (×3): 15 mL via OROMUCOSAL

## 2021-05-06 MED ORDER — LIDOCAINE-EPINEPHRINE 1 %-1:100000 IJ SOLN
INTRAMUSCULAR | Status: DC | PRN
  Administered 2021-05-06: 5 mL

## 2021-05-06 MED ORDER — AMLODIPINE BESYLATE 10 MG PO TABS
10.0000 mg | ORAL_TABLET | Freq: Every day | ORAL | Status: DC
  Administered 2021-05-07 – 2021-05-09 (×3): 10 mg via ORAL
  Filled 2021-05-06 (×3): qty 1

## 2021-05-06 SURGICAL SUPPLY — 102 items
ADH SKN CLS APL DERMABOND .7 (GAUZE/BANDAGES/DRESSINGS) ×2
APL SRG 60D 8 XTD TIP BNDBL (TIP)
BAG COUNTER SPONGE SURGICOUNT (BAG) ×7 IMPLANT
BAG SPNG CNTER NS LX DISP (BAG) ×6
BAG SURGICOUNT SPONGE COUNTING (BAG) ×3
BASKET BONE COLLECTION (BASKET) ×4 IMPLANT
BIT DRILL LONG 3.0X30 (BIT) IMPLANT
BIT DRILL LONG 3.0X30MM (BIT)
BIT DRILL LONG 3X80 (BIT) ×1 IMPLANT
BIT DRILL LONG 3X80MM (BIT) ×1
BIT DRILL LONG 4X80 (BIT) IMPLANT
BIT DRILL LONG 4X80MM (BIT)
BIT DRILL SHORT 3.0X30 (BIT) IMPLANT
BIT DRILL SHORT 3.0X30MM (BIT)
BIT DRILL SHORT 3X80 (BIT) IMPLANT
BIT DRILL SHORT 3X80MM (BIT)
BLADE CLIPPER SURG (BLADE) ×2 IMPLANT
BLADE SURG 11 STRL SS (BLADE) ×4 IMPLANT
BONE CANC CHIPS 40CC CAN1/2 (Bone Implant) ×4 IMPLANT
BUR MATCHSTICK NEURO 3.0 LAGG (BURR) ×4 IMPLANT
CAGE COROENT 9X9X23-4 (Cage) ×2 IMPLANT
CANISTER SUCT 3000ML PPV (MISCELLANEOUS) ×4 IMPLANT
CEMENT KYPHON C01A KIT/MIXER (Cement) ×2 IMPLANT
CHIPS CANC BONE 40CC CAN1/2 (Bone Implant) ×2 IMPLANT
CNTNR URN SCR LID CUP LEK RST (MISCELLANEOUS) ×2 IMPLANT
CONT SPEC 4OZ STRL OR WHT (MISCELLANEOUS) ×4
COVER BACK TABLE 60X90IN (DRAPES) ×4 IMPLANT
DECANTER SPIKE VIAL GLASS SM (MISCELLANEOUS) ×4 IMPLANT
DERMABOND ADVANCED (GAUZE/BANDAGES/DRESSINGS) ×2
DERMABOND ADVANCED .7 DNX12 (GAUZE/BANDAGES/DRESSINGS) ×2 IMPLANT
DEVICE DISSECT PLASMABLAD 3.0S (MISCELLANEOUS) ×2 IMPLANT
DIGITIZER BENDINI (MISCELLANEOUS) ×2 IMPLANT
DRAPE C-ARM 42X72 X-RAY (DRAPES) ×8 IMPLANT
DRAPE C-ARMOR (DRAPES) ×2 IMPLANT
DRAPE HALF SHEET 40X57 (DRAPES) ×4 IMPLANT
DRAPE LAPAROTOMY 100X72X124 (DRAPES) ×4 IMPLANT
DRAPE SHEET LG 3/4 BI-LAMINATE (DRAPES) ×4 IMPLANT
DRAPE WARM FLUID 44X44 (DRAPES) ×2 IMPLANT
DRSG OPSITE POSTOP 4X10 (GAUZE/BANDAGES/DRESSINGS) ×2 IMPLANT
DURAPREP 26ML APPLICATOR (WOUND CARE) ×4 IMPLANT
DURASEAL APPLICATOR TIP (TIP) IMPLANT
DURASEAL SPINE SEALANT 3ML (MISCELLANEOUS) IMPLANT
ELECT BLADE 4.0 EZ CLEAN MEGAD (MISCELLANEOUS)
ELECT REM PT RETURN 9FT ADLT (ELECTROSURGICAL) ×4
ELECTRODE BLDE 4.0 EZ CLN MEGD (MISCELLANEOUS) IMPLANT
ELECTRODE REM PT RTRN 9FT ADLT (ELECTROSURGICAL) ×2 IMPLANT
GAUZE 4X4 16PLY ~~LOC~~+RFID DBL (SPONGE) ×2 IMPLANT
GAUZE SPONGE 4X4 12PLY STRL (GAUZE/BANDAGES/DRESSINGS) ×2 IMPLANT
GLOVE SURG LTX SZ7 (GLOVE) ×2 IMPLANT
GLOVE SURG LTX SZ8.5 (GLOVE) ×8 IMPLANT
GLOVE SURG PR MICRO ENCORE 7 (GLOVE) ×10 IMPLANT
GLOVE SURG UNDER POLY LF SZ7.5 (GLOVE) ×12 IMPLANT
GLOVE SURG UNDER POLY LF SZ8.5 (GLOVE) ×8 IMPLANT
GOWN STRL REUS W/ TWL LRG LVL3 (GOWN DISPOSABLE) IMPLANT
GOWN STRL REUS W/ TWL XL LVL3 (GOWN DISPOSABLE) IMPLANT
GOWN STRL REUS W/TWL 2XL LVL3 (GOWN DISPOSABLE) ×8 IMPLANT
GOWN STRL REUS W/TWL LRG LVL3 (GOWN DISPOSABLE) ×8
GOWN STRL REUS W/TWL XL LVL3 (GOWN DISPOSABLE) ×8
GRAFT BN 5X1XSPNE CVD POST DBM (Bone Implant) IMPLANT
GRAFT BNE CHIP CANC 1-8 40 (Bone Implant) IMPLANT
GRAFT BONE MAGNIFUSE 1X5CM (Bone Implant) ×4 IMPLANT
GRAFT BONE PROTEIOS LRG 5CC (Orthopedic Implant) ×2 IMPLANT
GUIDEWIRE NITINOL BEVEL TIP (WIRE) ×16 IMPLANT
HEMOSTAT POWDER KIT SURGIFOAM (HEMOSTASIS) ×4 IMPLANT
KIT BASIN OR (CUSTOM PROCEDURE TRAY) ×4 IMPLANT
KIT GRAFTMAG DEL NEURO DISP (NEUROSURGERY SUPPLIES) ×4 IMPLANT
KIT SPINE MAZOR X ROBO DISP (MISCELLANEOUS) ×4 IMPLANT
KIT TURNOVER KIT B (KITS) ×4 IMPLANT
MILL MEDIUM DISP (BLADE) ×4 IMPLANT
NEEDLE HYPO 22GX1.5 SAFETY (NEEDLE) ×4 IMPLANT
NS IRRIG 1000ML POUR BTL (IV SOLUTION) ×6 IMPLANT
PACK LAMINECTOMY NEURO (CUSTOM PROCEDURE TRAY) ×4 IMPLANT
PAD ARMBOARD 7.5X6 YLW CONV (MISCELLANEOUS) ×10 IMPLANT
PATTIES SURGICAL .5 X1 (DISPOSABLE) ×4 IMPLANT
PIN HEAD 2.5X60MM (PIN) IMPLANT
PLASMABLADE 3.0S (MISCELLANEOUS) ×4
PUSHER RELINE FENS MAS (MISCELLANEOUS) ×16 IMPLANT
RELINE MAS NDL FENS (NEEDLE) IMPLANT
RELINE MAS NEEDLE FENS (NEEDLE) ×32 IMPLANT
ROD RELINE 0-0 CON M 5.0/6.0MM (Rod) ×4 IMPLANT
ROD RELINE MAS ST 5.5X300MM (Rod) ×4 IMPLANT
SCREW LOCK RELINE 5.5 TULIP (Screw) ×24 IMPLANT
SCREW RELINE PA 2FC 5.5X45 (Screw) ×12 IMPLANT
SCREW RELINE PA 6.5X45 (Screw) ×4 IMPLANT
SCREW SCHANZ SA 4.0MM (MISCELLANEOUS) IMPLANT
SEALANT ADHERUS EXTEND TIP (MISCELLANEOUS) ×2 IMPLANT
SPONGE SURGIFOAM ABS GEL 100 (HEMOSTASIS) ×2 IMPLANT
SPONGE T-LAP 4X18 ~~LOC~~+RFID (SPONGE) ×4 IMPLANT
SUT PROLENE 6 0 BV (SUTURE) IMPLANT
SUT VIC AB 1 CT1 18XBRD ANBCTR (SUTURE) ×2 IMPLANT
SUT VIC AB 1 CT1 8-18 (SUTURE) ×8
SUT VIC AB 2-0 CP2 18 (SUTURE) ×6 IMPLANT
SUT VIC AB 3-0 SH 8-18 (SUTURE) ×4 IMPLANT
SUT VIC AB 4-0 RB1 18 (SUTURE) ×12 IMPLANT
SYR 3ML LL SCALE MARK (SYRINGE) ×16 IMPLANT
SYR CONTROL 10ML LL (SYRINGE) ×2 IMPLANT
TIP FENS REPLACE RELINE (MISCELLANEOUS) ×16 IMPLANT
TOWEL GREEN STERILE (TOWEL DISPOSABLE) ×4 IMPLANT
TOWEL GREEN STERILE FF (TOWEL DISPOSABLE) ×4 IMPLANT
TRAY FOLEY MTR SLVR 16FR STAT (SET/KITS/TRAYS/PACK) ×4 IMPLANT
TUBE MAZOR SA REDUCTION (TUBING) ×4 IMPLANT
WATER STERILE IRR 1000ML POUR (IV SOLUTION) ×4 IMPLANT

## 2021-05-06 NOTE — Transfer of Care (Signed)
Immediate Anesthesia Transfer of Care Note  Patient: KEYONTA BARRADAS  Procedure(s) Performed: Transforaminal Lumbar Interbody Fusion  - Thoracic twelve-Lumbar one fixation Thoracic nine to Lumbar one with methacrylate augmentation with mazor APPLICATION OF CELL SAVER APPLICATION OF ROBOTIC ASSISTANCE FOR SPINAL PROCEDURE  Patient Location: PACU  Anesthesia Type:General  Level of Consciousness: drowsy and patient cooperative  Airway & Oxygen Therapy: Patient Spontanous Breathing and Patient connected to face mask oxygen  Post-op Assessment: Report given to RN, Post -op Vital signs reviewed and stable and Patient moving all extremities X 4  Post vital signs: Reviewed and stable  Last Vitals:  Vitals Value Taken Time  BP 113/57 05/06/21 1611  Temp    Pulse 87 05/06/21 1615  Resp 20 05/06/21 1615  SpO2 100 % 05/06/21 1615  Vitals shown include unvalidated device data.  Last Pain:  Vitals:   05/06/21 0709  TempSrc:   PainSc: 3       Patients Stated Pain Goal: 3 (53/29/92 4268)  Complications: No notable events documented.

## 2021-05-06 NOTE — H&P (Signed)
[name] is an 80 y.o. male.   Chief Complaint: Bilateral hip and lower extremity pain, severe back pain degenerative changes T12-L1 HPI: [name] is an 80 year old individual who has had severe spondylitic disease over a number of years having had several surgeries to decompress and stabilize his lumbar spine as it progressively got worse.  He has had an L1-S1 fusion and has now developed adjacent level disease at T12-L1.  He has a degenerative scoliosis that is also forming in his has a high-grade stenosis at T12-L1's been advised regarding the need to perform stabilization and fixation from T10 down to the L1 S1 fusion he is now admitted for this procedure.  Past Medical History:  Diagnosis Date   Arthritis    Cancer ([place])    skin   Carpal tunnel syndrome    Cataract    left eye   Chronic back pain    multiple back surgeries   Complication of anesthesia    pt states woke up during hemorroid surgery   DVT (deep venous thrombosis) ([place]) 03/2007   left leg    Dysrhythmia    GERD (gastroesophageal reflux disease)    takes Protonix daily   Glaucoma    History of colon polyps    History of hyperglycemia    History of MRSA infection 1992   HTN (hypertension)    takes  Ramipril daily   Hyperlipidemia    takes Pravastatin 3 times a week   Nocturia    Paroxysmal atrial fibrillation (HCC)    takes Pradaxa daily, Chads2vasc score 2(07/11/14)   Pneumonia    many yrs ago   Pulmonary embolism ([place]) 2008    Past Surgical History:  Procedure Laterality Date   BACK SURGERY     BIOPSY  04/01/2018   Procedure: BIOPSY;  Surgeon: Rogene Houston, MD;  [facility];  Service: Endoscopy;;  gastric   CARDIAC CATHETERIZATION  05/18/2006   CARPAL TUNNEL RELEASE Right    cataract surgery Right    cervical and lumbar surgery     COLONOSCOPY  04/24/2011   Procedure: COLONOSCOPY;  Surgeon: Rogene Houston, MD;  [facility];  Service: Endoscopy;  Laterality:  N/A;  1200   COLONOSCOPY N/A 08/13/2016   Procedure: COLONOSCOPY;  Surgeon: Rogene Houston, MD;  [facility];  Service: Endoscopy;  Laterality: N/A;  1200   ESOPHAGOGASTRODUODENOSCOPY  03/10/2012   Procedure: ESOPHAGOGASTRODUODENOSCOPY (EGD);  Surgeon: Rogene Houston, MD;  [facility];  Service: Endoscopy;  Laterality: N/A;  325   ESOPHAGOGASTRODUODENOSCOPY (EGD) WITH PROPOFOL N/A 04/01/2018   Procedure: ESOPHAGOGASTRODUODENOSCOPY (EGD) WITH PROPOFOL;  Surgeon: Rogene Houston, MD;  [facility];  Service: Endoscopy;  Laterality: N/A;   EYE SURGERY     HARDWARE REMOVAL N/A 04/24/2014   Procedure: REMOVAL OF SACRAL INSTRUMENTATION WITH [place];  Surgeon: Kristeen Miss, MD;  Location: [facility];  Service: Neurosurgery;  Laterality: N/A;   HEMORRHOIDECTOMY WITH HEMORRHOID BANDING     JOINT REPLACEMENT     KNEE ARTHROSCOPY Right    KNEE ARTHROSCOPY WITH MEDIAL MENISECTOMY Right 08/03/2018   Procedure: RIGHT KNEE ARTHROSCOPY WITH PARTIAL MEDIAL MENISCECTOMY;  Surgeon: Leandrew Koyanagi, MD;  Location: [facility];  Service: Orthopedics;  Laterality: Right;   KNEE SURGERY     left   LASIK     POLYPECTOMY  08/13/2016   Procedure: POLYPECTOMY;  Surgeon: Rogene Houston, MD;  [facility];  Service: Endoscopy;;  colon   PVI  01/24/2010   afib ablation    Family History  Problem Relation Age of Onset   Hypertension Mother    Transient ischemic attack Mother    Heart disease Father    Diabetes Brother    Healthy Daughter    Colon cancer Sister    Hypertension Other    Heart disease Other    Social History:  reports that he quit smoking about 52 years ago. His smoking use included cigarettes. He has a 0.75 pack-year smoking history. He has never used smokeless tobacco. He reports current alcohol use of about 14.0 standard drinks per week. He reports that he does not use drugs.  Allergies:  Allergies  Allergen Reactions   Sectral  [Acebutolol Hcl] Other (See Comments)    Unknown reaction   Sulfonamide Derivatives Other (See Comments)    Runs blood pressure up   Zocor [Simvastatin] Other (See Comments)    Joint pain    Medications Prior to Admission  Medication Sig Dispense Refill   amLODipine (NORVASC) 10 MG tablet TAKE 1 TABLET BY MOUTH EVERY DAY 90 tablet 1   ELIQUIS 5 MG TABS tablet TAKE 1 TABLET BY MOUTH TWICE A DAY. STOP PRADAXA 60 tablet 5   indapamide (LOZOL) 2.5 MG tablet TAKE 1 TABLET BY MOUTH EVERY DAY 90 tablet 1   latanoprost (XALATAN) 0.005 % ophthalmic solution Place 1 drop into both eyes at bedtime.     Oxycodone HCl 10 MG TABS Take one tablet po  6 times per day (Patient taking differently: Take 10 mg by mouth in the morning, at noon, in the evening, and at bedtime.) 180 tablet 0   pantoprazole (PROTONIX) 40 MG tablet Take 1 tablet (40 mg total) by mouth daily before breakfast. (Patient taking differently: Take 40 mg by mouth 2 (two) times daily as needed (acid reflux).) 90 tablet 4   polyethylene glycol powder (GLYCOLAX/MIRALAX) 17 GM/SCOOP powder Take 8.5 g by mouth every other day.     pravastatin (PRAVACHOL) 80 MG tablet TAKE 1 TABLET BY MOUTH EVERYDAY AT BEDTIME 90 tablet 1   psyllium (METAMUCIL SMOOTH TEXTURE) 58.6 % powder Take 1 packet by mouth at bedtime. (Patient taking differently: Take 1 packet by mouth 3 (three) times daily as needed (constipation).)     ramipril (ALTACE) 10 MG capsule TAKE 1 CAPSULE BY MOUTH 2 TIMES DAILY. 180 capsule 0   diphenhydrAMINE-APAP, sleep, (TYLENOL PM EXTRA STRENGTH PO) Take 1 tablet by mouth at bedtime as needed (sleep).      No results found for this or any previous visit (from the past 48 hour(s)). No results found.  Review of Systems  Constitutional:  Positive for activity change.  Musculoskeletal:  Positive for back pain, gait problem and myalgias.  Neurological:  Positive for weakness and numbness.  All other systems reviewed and are  negative.  Blood pressure (!) 157/78, pulse 72, temperature (!) 97.5 F (36.4 C), temperature source Oral, resp. rate 17, height 5\' 6"  (1.676 m), weight 78 kg, SpO2 97 %. Physical Exam Constitutional:      Appearance: Normal appearance. He is normal weight.  HENT:     Head: Normocephalic and atraumatic.     Right Ear: Tympanic membrane normal.     Left Ear: Tympanic membrane normal.     Nose: Nose normal.     Mouth/Throat:     Mouth: Mucous membranes are moist.  Eyes:     Extraocular Movements: Extraocular  movements intact.     Conjunctiva/sclera: Conjunctivae normal.     Pupils: Pupils are equal, round, and reactive to light.  Neck:     Comments: Decreased range of motion Abdominal:     General: Abdomen is flat. Bowel sounds are normal.     Palpations: Abdomen is soft.  Musculoskeletal:     Cervical back: Neck supple.     Comments: Markedly stiff left shoulder secondary to presumed rotator cuff.  The severe restriction and straight leg raising at 15 degrees Patrick's maneuver is positive bilaterally.  Skin:    General: Skin is warm and dry.     Capillary Refill: Capillary refill takes less than 2 seconds.  Neurological:     Comments: Weakness in the tibialis anterior on the left at 3 out of 5 right at 4 out of 5 gastroc strength 4 out of 5 bilaterally absent reflexes in the patellae and the Achilles both.  Upper extremity reflexes are absent in the biceps and triceps strength is decreased in the biceps to 4 out of 5 secondary to bicep tears bilaterally.  Wrist extensor and grip strength is 4+ out of 5.  Cranial nerve examination is normal.  Psychiatric:        Mood and Affect: Mood normal.        Behavior: Behavior normal.        Thought Content: Thought content normal.        Judgment: Judgment normal.     Assessment/Plan Spondylosis and stenosis with degenerative scoliosis T12-L1.  History of L1-L5 fusion.  Plan: Arthrodesis from T9 to L1-L5 fixation with decompression  of T12-L1.  Robotic assistance for screw placement.  Earleen Newport, MD 05/06/2021, 10:05 AM

## 2021-05-06 NOTE — Anesthesia Preprocedure Evaluation (Signed)
Anesthesia Evaluation  Patient identified by MRN, date of birth, ID band Patient awake    Reviewed: Allergy & Precautions, NPO status , Patient's Chart, lab work & pertinent test results  Airway Mallampati: III  TM Distance: >3 FB Neck ROM: Full    Dental  (+) Teeth Intact, Dental Advisory Given, Partial Upper   Pulmonary former smoker,    breath sounds clear to auscultation       Cardiovascular hypertension, Pt. on medications + dysrhythmias Atrial Fibrillation  Rhythm:Regular Rate:Normal     Neuro/Psych  Neuromuscular disease negative psych ROS   GI/Hepatic Neg liver ROS, GERD  Medicated,  Endo/Other  negative endocrine ROS  Renal/GU negative Renal ROS     Musculoskeletal  (+) Arthritis , Osteoarthritis,    Abdominal Normal abdominal exam  (+)   Peds  Hematology negative hematology ROS (+)   Anesthesia Other Findings - HLD  Reproductive/Obstetrics                             Lab Results  Component Value Date   WBC 6.2 04/28/2021   HGB 13.9 04/28/2021   HCT 42.0 04/28/2021   MCV 94.6 04/28/2021   PLT 324 04/28/2021   Lab Results  Component Value Date   CREATININE 1.10 04/18/2021   BUN 19 04/18/2021   NA 141 04/18/2021   K 4.2 04/18/2021   CL 99 04/18/2021   CO2 24 04/18/2021   Lab Results  Component Value Date   INR 1.12 09/18/2010   INR 2.68 (H) 01/25/2010   INR 2.46 (H) 01/24/2010   EKG: normal sinus rhythm.  Anesthesia Physical  Anesthesia Plan  ASA: III  Anesthesia Plan: General   Post-op Pain Management:    Induction: Intravenous  PONV Risk Score and Plan: 2 and Ondansetron, Midazolam and Treatment may vary due to age or medical condition  Airway Management Planned: Oral ETT  Additional Equipment: None  Intra-op Plan:   Post-operative Plan: Extubation in OR  Informed Consent: I have reviewed the patients History and Physical, chart, labs and  discussed the procedure including the risks, benefits and alternatives for the proposed anesthesia with the patient or authorized representative who has indicated his/her understanding and acceptance.     Dental advisory given  Plan Discussed with: CRNA  Anesthesia Plan Comments:         Anesthesia Quick Evaluation

## 2021-05-06 NOTE — OR Nursing (Signed)
Radiopaque bone cement lot number [id number] expires 11/15/23 used during procedure by Dr Ellene Route

## 2021-05-06 NOTE — Progress Notes (Signed)
Patient ID: [name], male   DOB: 1940-08-16, 80 y.o.   MRN: [id number] In the recovery room patient was complaining bitterly of severe pain.  He received 2-1/2 mg of Dilaudid and 30 of Toradol but this was not soothing him at all.  5 mg of Valium however caused him to become apneic for a brief period he was supported through this and appears to be markedly better with less pain after the small dose of Valium.  We will observe him on 4 N. progressive and keep him on the monitor as he does have a history of atrial fibrillation.  We will continue to monitor him closely.

## 2021-05-06 NOTE — Anesthesia Postprocedure Evaluation (Signed)
Anesthesia Post Note  Patient: MAJID MCCRAVY  Procedure(s) Performed: Transforaminal Lumbar Interbody Fusion  - Thoracic twelve-Lumbar one fixation Thoracic nine to Lumbar one with methacrylate augmentation with mazor APPLICATION OF CELL SAVER APPLICATION OF ROBOTIC ASSISTANCE FOR SPINAL PROCEDURE     Patient location during evaluation: PACU Anesthesia Type: General Level of consciousness: awake Pain control: treating pain. Vital Signs Assessment: post-procedure vital signs reviewed and stable Respiratory status: spontaneous breathing, nonlabored ventilation, respiratory function stable and patient connected to nasal cannula oxygen Cardiovascular status: blood pressure returned to baseline and stable Postop Assessment: no apparent nausea or vomiting Anesthetic complications: yes (suspected corneal abrasion)   No notable events documented.  Last Vitals:  Vitals:   05/06/21 1910 05/06/21 1940  BP: (!) 143/72 121/68  Pulse: 89 87  Resp: 16 16  Temp:    SpO2: 100% 100%    Last Pain:  Vitals:   05/06/21 1910  TempSrc:   PainSc: 0-No pain                 Halynn Reitano P Kenniyah Sasaki

## 2021-05-06 NOTE — Op Note (Signed)
Date of surgery: 05/06/2021 Preoperative diagnosis: Lumbar spondylosis and stenosis T12-L1.  Neurogenic claudication.  Degenerative scoliosis.  History of fusion L1-L5. Postoperative diagnosis: Same Procedure: Decompression of T12-L1 with TLIF arthrodesis using peek spacer local autograft and allograft on the right side with Proteus pedicle fixation from T9 L1-L5 hardware fixation with posterior and posterolateral arthrodesis using local autograft and allograft T9-L2.  Mazor robot assistance for placement of pedicle screws. Surgeon: Kristeen Miss First Assistant: Consuella Lose MD Anesthesia: General endotracheal Indications: [name] is an 80 year old individual whose had extensive surgery in the past with a fusion from L1 L5.  He has developed adjacent level disease at T12-L1 with severe stenosis he has been also noted to have a degenerative scoliosis forming and was advised regarding surgery to decompress T12-L1 and also form an arthrodesis from T9 down to his previous fusion at L1 L5.  He is now admitted for this procedure.  Procedure: Patient was brought to the operating room supine on the stretcher.  After the smooth induction of general tracheal anesthesia he was carefully turned onto the [place] table in a prone position.  Bony prominences were appropriately padded and protected and the back was prepped with alcohol DuraPrep and draped in a sterile fashion robotic assistance was used with the [place] robot.  The procedure was started by opening across the T12-L1 level and identifying the superior margin of the old hardware.  Then a laminotomy was created on the right side opening the lamina on the right to expose the foramen of T12-L1.  The dissection was carried out through the foramen to expose the lateral aspect of the disc.  This area was carefully isolated and decompressed.  Laminotomy was also created on the opposite side to remove thickened redundant yellow ligament and alleviate some of  the spinal cord compression.  In the process there was noted to be some densely adherent scar tissue from the inferior margin lamina of T12.  This created a small dural tear with some arachnoid poking through but no spinal fluid leaking.  Once this was decompressed the area was sealed over with some biologic adhesive.  Next we completed discectomy from the right-sided approach and decompressed the disc space series of disc space spreaders were used to open the canal to a greater extent and ultimately a 9 mm tall by 23 mm long by 9 mm wide spacer with 4 degrees of lordosis was fitted into this interval.  The endplates were carefully decorticated and then they were interspace was packed with a total of 18 cc of bone graft she was a combination of autograft allograft and Proteus.  The cage was placed carefully.  Then using a percutaneous approach with the Mazor robot K wires were passed from [name]  Radiographs were checked to make sure the K wires are in good position and when this was verified pedicle screws were passed over the K wires with a 5.5 x 45 mm screws in T9-T10 and T11 and 6.5 x 45 mm screws in T12.  Because the bone appeared soft we decided to augment the screws with 1 cc of methacrylate in each screw.  This was performed without difficulty and showed good cement passage into the vertebral bodies.  With this the [place] robot was used to create a rod that would be attached to the interval between the L1 and L2 screws on each side.  SideArm connector was placed in this region after clearing the material adequately also troth was created to  allow placement of allograft autograft and the Proteus mixture in the interval from L1-L2 connecting to the rest of the arthrodesis posteriorly up to the T9 level.  With this a rod was bent to fit in the saddles from the pedicle screws down to the transverse connector and on the right side the rod measured 163 mm and was fitted in a neutral construct on the  left side it measured 153 mm and was similarly connected the system was torqued into neutral construct and then the posterior interspinous spaces at T9-10 and T10-11 T11-12 and T12-L1 were then packed with bone graft Magnifuse was used on the right side to cover the laminotomy defect with allograft bone.  Once this was all secured the area was checked for hemostasis and then the thoracodorsal fascia was closed with #1 Vicryl 2-0 Vicryl was used in subcutaneous tissues and 3-0 Vicryl subcuticularly the towers which secured the pedicle screws placed percutaneously had all been removed those openings were closed with 2-0 Vicryl in interrupted fashion 3-0 Vicryl was used in the subcuticular tissues.  Dermabond was placed on the skin.  Blood loss for the entire procedure was estimated 400 cc.  Final radiographs identified good position of the hardware good position of the interbody spacer and a solid construct from T9 now connected to a previous L1 L5 fusion.

## 2021-05-06 NOTE — Anesthesia Procedure Notes (Signed)
Procedure Name: Intubation Date/Time: 05/06/2021 10:29 AM Performed by: Amadeo Garnet, CRNA Pre-anesthesia Checklist: Patient identified, Emergency Drugs available, Suction available and Patient being monitored Patient Re-evaluated:Patient Re-evaluated prior to induction Oxygen Delivery Method: Circle system utilized Preoxygenation: Pre-oxygenation with 100% oxygen Induction Type: IV induction Ventilation: Mask ventilation without difficulty Laryngoscope Size: Mac and 4 Grade View: Grade II Tube type: Oral Number of attempts: 1 Airway Equipment and Method: Stylet and Oral airway Placement Confirmation: ETT inserted through vocal cords under direct vision, positive ETCO2 and breath sounds checked- equal and bilateral Secured at: 22 cm Tube secured with: Tape Dental Injury: Teeth and Oropharynx as per pre-operative assessment

## 2021-05-07 ENCOUNTER — Encounter (HOSPITAL_COMMUNITY): Payer: Self-pay | Admitting: Neurological Surgery

## 2021-05-07 ENCOUNTER — Other Ambulatory Visit: Payer: Self-pay | Admitting: Family Medicine

## 2021-05-07 LAB — BASIC METABOLIC PANEL
Anion gap: 9 (ref 5–15)
BUN: 29 mg/dL — ABNORMAL HIGH (ref 8–23)
CO2: 25 mmol/L (ref 22–32)
Calcium: 8.4 mg/dL — ABNORMAL LOW (ref 8.9–10.3)
Chloride: 100 mmol/L (ref 98–111)
Creatinine, Ser: 1.18 mg/dL (ref 0.61–1.24)
GFR, Estimated: >60 mL/min (ref >60)
Glucose, Bld: 141 mg/dL — ABNORMAL HIGH (ref 70–99)
Potassium: 3.9 mmol/L (ref 3.5–5.1)
Sodium: 134 mmol/L — ABNORMAL LOW (ref 135–145)

## 2021-05-07 LAB — CBC
HCT: 33.5 % — ABNORMAL LOW (ref 39.0–52.0)
Hemoglobin: 11.3 g/dL — ABNORMAL LOW (ref 13.0–17.0)
MCH: 31 pg (ref 26.0–34.0)
MCHC: 33.7 g/dL (ref 30.0–36.0)
MCV: 92 fL (ref 80.0–100.0)
Platelets: 266 10*3/uL (ref 150–400)
RBC: 3.64 MIL/uL — ABNORMAL LOW (ref 4.22–5.81)
RDW: 12.7 % (ref 11.5–15.5)
WBC: 10.1 10*3/uL (ref 4.0–10.5)
nRBC: 0 % (ref 0.0–0.2)

## 2021-05-07 MED ORDER — DICLOFENAC SODIUM 1 % EX GEL
2.0000 g | Freq: Four times a day (QID) | CUTANEOUS | Status: DC | PRN
  Administered 2021-05-07: 07:00:00 2 g via TOPICAL
  Filled 2021-05-07: qty 100

## 2021-05-07 MED ORDER — DICLOFENAC SODIUM 1 % EX GEL
2.0000 g | Freq: Three times a day (TID) | CUTANEOUS | Status: DC
Start: 2021-05-07 — End: 2021-05-07
  Filled 2021-05-07: qty 100

## 2021-05-07 MED ORDER — OXYCODONE HCL 5 MG PO TABS
10.0000 mg | ORAL_TABLET | ORAL | Status: DC | PRN
  Administered 2021-05-07 – 2021-05-09 (×7): 10 mg via ORAL
  Filled 2021-05-07 (×8): qty 2

## 2021-05-07 MED ORDER — ADULT MULTIVITAMIN W/MINERALS CH
1.0000 | ORAL_TABLET | Freq: Every day | ORAL | Status: DC
  Administered 2021-05-07 – 2021-05-09 (×3): 1 via ORAL
  Filled 2021-05-07 (×3): qty 1

## 2021-05-07 MED ORDER — APIXABAN 5 MG PO TABS
5.0000 mg | ORAL_TABLET | Freq: Two times a day (BID) | ORAL | Status: DC
  Administered 2021-05-07 – 2021-05-09 (×5): 5 mg via ORAL
  Filled 2021-05-07 (×5): qty 1

## 2021-05-07 MED FILL — Thrombin For Soln 5000 Unit: CUTANEOUS | Qty: 5000 | Status: AC

## 2021-05-07 NOTE — Progress Notes (Signed)
Initial Nutrition Assessment  DOCUMENTATION CODES:   Not applicable  INTERVENTION:  - Encourage PO intake  - Continue Ensure Enlive po BID, each supplement provides 350 kcal and 20 grams of protein  - MVI with minerals daily  NUTRITION DIAGNOSIS:   Increased nutrient needs related to post-op healing as evidenced by estimated needs.  GOAL:   Patient will meet greater than or equal to 90% of their needs  MONITOR:   PO intake, Supplement acceptance, Labs, Weight trends, Skin  REASON FOR ASSESSMENT:   Malnutrition Screening Tool    ASSESSMENT:   Pt admitted on 05/06/21 for stabilization and fixation from T10 to L1S1 fusion d/t degenerative scoliosis and high grade stenosis at T12-L1 causing bilateral hip and lower extremity pain as well as severe back pain. Pt with severe spondylitic disease having had multiple prior decompression and stabilization surgeries.  RD working remotely. Reached pt by phone. His wife provided history. She states that he had a little bit of oatmeal for breakfast today. She states he has a poor appetite d/t pain. At home he typically eats 2 meals per day. These on consist of breakfast (oatmeal with milk, coffee with no cream or sugar) and a late lunch/early supper (a meat with a side). She reports that he received an Ensure this morning and he drank 100%. Denies difficulty chewing or swallowing.  He reports a usual body weight of 170 lbs and denies recent weight loss. Per review of chart noted weight ranges of 177-182 lbs. Noted a 5% weight loss from 04/02/21-04/28/21.   Medications: colace, lozol, protonix, miralax, psyllium, senna  Labs: sodium 134, BUN 29    NUTRITION - FOCUSED PHYSICAL EXAM: RD working remotely. Needs NFPE at follow up.  Diet Order:   Diet Order             Diet regular Room service appropriate? Yes; Fluid consistency: Thin  Diet effective now                   EDUCATION NEEDS:   Education needs have been  addressed  Skin:  Skin Assessment: Skin Integrity Issues: Skin Integrity Issues:: Incisions Incisions: back  Last BM:  PTA  Height:   Ht Readings from Last 1 Encounters:  05/06/21 5\' 6"  (1.676 m)    Weight:   Wt Readings from Last 1 Encounters:  05/06/21 78 kg    BMI:  Body mass index is 27.76 kg/m.  Estimated Nutritional Needs:   Kcal:  1900-2100  Protein:  95-110g  Fluid:  >/=1.9L  Clayborne Dana, RDN, LDN Clinical Nutrition

## 2021-05-07 NOTE — Evaluation (Signed)
Physical Therapy Evaluation Patient Details Name: [name] MRN: [id number] DOB: 08-12-1940 Today's Date: 05/07/2021  History of Present Illness  80 yo male s/p decompression T12-L1 with TLIF, pedicle fixation from T9 L1-L5 with posterior and posterolateral arthrodesis T9-L2. PMH includes skin cancer, chronic back pain with history of scoliosis and multiple back surgeries, DVT/PE, HTN, HLD, PAF.  Clinical Impression   Pt presents with severe post-operative pain, mod difficulty performing mobility tasks, decreased knowledge and application of back precautions, and decreased activity tolerance vs baseline. Pt to benefit from acute PT to address deficits. Pt tolerating stand pivot transfer x2 only this session given severe pain, reports he takes 10 mg oxycodone every 3 hours at baseline given chronic pain. PT anticipates once pt's pain is better controlled, pt will progress well. PT to progress mobility as tolerated, and will continue to follow acutely.         Recommendations for follow up therapy are one component of a multi-disciplinary discharge planning process, led by the attending physician.  Recommendations may be updated based on patient status, additional functional criteria and insurance authorization.  Follow Up Recommendations Home health PT    Assistance Recommended at Discharge Frequent or constant Supervision/Assistance  Functional Status Assessment Patient has had a recent decline in their functional status and demonstrates the ability to make significant improvements in function in a reasonable and predictable amount of time.  Equipment Recommendations  None recommended by PT    Recommendations for Other Services       Precautions / Restrictions Precautions Precautions: Back Precaution Comments: reviewed log roll technique Restrictions Weight Bearing Restrictions: No Other Position/Activity Restrictions: No brace      Mobility  Bed Mobility Overal bed  mobility: Needs Assistance Bed Mobility: Rolling;Sidelying to Sit;Sit to Sidelying Rolling: Mod assist Sidelying to sit: Mod assist     Sit to sidelying: Mod assist General bed mobility comments: mod assist for log roll to/from EOB, trunk elevation off of bed, repositioning with return to supine    Transfers Overall transfer level: Needs assistance Equipment used: Rolling walker (2 wheels) Transfers: Sit to/from Stand;Bed to chair/wheelchair/BSC Sit to Stand: Min assist   Step pivot transfers: Min assist       General transfer comment: min assist for power up, rise, steadying, and pivot to/from recliner. PT assisting and cuing pt to watch for lines/leads    Ambulation/Gait               General Gait Details: NT - pain presentation  Stairs            Wheelchair Mobility    Modified Rankin (Stroke Patients Only)       Balance Overall balance assessment: Needs assistance Sitting-balance support: Feet supported Sitting balance-Leahy Scale: Fair     Standing balance support: Reliant on assistive device for balance Standing balance-Leahy Scale: Poor                               Pertinent Vitals/Pain Pain Assessment: Faces Faces Pain Scale: Hurts worst Pain Location: Back, L hip Pain Descriptors / Indicators: Operative site guarding;Grimacing;Guarding;Spasm;Other (Comment) (nausea, feeling "weak") Pain Intervention(s): Limited activity within patient's tolerance;Monitored during session;Repositioned    Home Living Family/patient expects to be discharged to:: Private residence Living Arrangements: Spouse/significant other Available Help at Discharge: Family Type of Home: House Home Access: Stairs to enter   Technical brewer of Steps: 3   Home Layout: One level Home  Equipment: Conservation officer, nature (2 wheels);Cane - single point      Prior Function Prior Level of Function : Independent/Modified Independent             Mobility  Comments: used a Radio producer for community mobility. ADLs Comments: No assist     Hand Dominance   Dominant Hand: Right    Extremity/Trunk Assessment   Upper Extremity Assessment Upper Extremity Assessment: Defer to OT evaluation    Lower Extremity Assessment Lower Extremity Assessment: Generalized weakness    Cervical / Trunk Assessment Cervical / Trunk Assessment: Back Surgery  Communication   Communication: No difficulties  Cognition Arousal/Alertness: Awake/alert Behavior During Therapy: WFL for tasks assessed/performed Overall Cognitive Status: Within Functional Limits for tasks assessed                                          General Comments      Exercises     Assessment/Plan    PT Assessment Patient needs continued PT services  PT Problem List Decreased strength;Decreased mobility;Decreased safety awareness;Decreased activity tolerance;Decreased balance;Decreased knowledge of use of DME;Pain;Decreased knowledge of precautions       PT Treatment Interventions DME instruction;Therapeutic activities;Therapeutic exercise;Gait training;Patient/family education;Balance training;Stair training;Functional mobility training;Neuromuscular re-education    PT Goals (Current goals can be found in the Care Plan section)  Acute Rehab PT Goals PT Goal Formulation: With patient/family Time For Goal Achievement: 05/21/21 Potential to Achieve Goals: Good    Frequency Min 5X/week   Barriers to discharge        Co-evaluation               AM-PAC PT "6 Clicks" Mobility  Outcome Measure Help needed turning from your back to your side while in a flat bed without using bedrails?: A Lot Help needed moving from lying on your back to sitting on the side of a flat bed without using bedrails?: A Lot Help needed moving to and from a bed to a chair (including a wheelchair)?: A Lot Help needed standing up from a chair using your arms (e.g., wheelchair or bedside  chair)?: A Little Help needed to walk in hospital room?: A Lot Help needed climbing 3-5 steps with a railing? : A Lot 6 Click Score: 13    End of Session   Activity Tolerance: Patient limited by pain Patient left: in bed;with call bell/phone within reach;with bed alarm set;with family/visitor present Nurse Communication: Mobility status PT Visit Diagnosis: Other abnormalities of gait and mobility (R26.89);Difficulty in walking, not elsewhere classified (R26.2);Pain Pain - Right/Left:  (mid) Pain - part of body:  (back)    Time: 2694-8546 PT Time Calculation (min) (ACUTE ONLY): 11 min   Charges:   PT Evaluation $PT Eval Low Complexity: 1 Low        [name] S, PT DPT Acute Rehabilitation Services Pager [phone]  Office [phone]   [name] 05/07/2021, 2:09 PM

## 2021-05-07 NOTE — Progress Notes (Signed)
Patient ID: [name], male   DOB: 1940-10-14, 80 y.o.   MRN: [id number] Vital signs are stable Complaining of significant back pain Oxycodone does seem to help and the morphine injections are helping also We will try to convert him to orals today Mobilize as tolerated

## 2021-05-07 NOTE — Evaluation (Signed)
Occupational Therapy Evaluation Patient Details Name: [name] MRN: [id number] DOB: 1941/05/06 Today's Date: 05/07/2021   History of Present Illness 80 yo M s/p TLIF of T-spine and L-spine.  PMH includes: HTN, Arthritis, chronic back pain.   Clinical Impression   Patient admitted for the procedure above.  PTA he lives with his spouse, uses a [facility] for community mobility, and needed no assist for ADL/IADL.  Pain is the deficit.  Currently he is needing up to Max A for ADL at sit/stand level, and needs up to Mod A for basic mobility.  OT to follow in the acute setting, but as pain subsides, home with [facility] and assist from family is anticipated.        Recommendations for follow up therapy are one component of a multi-disciplinary discharge planning process, led by the attending physician.  Recommendations may be updated based on patient status, additional functional criteria and insurance authorization.   Follow Up Recommendations  Home health OT    Assistance Recommended at Discharge Intermittent Supervision/Assistance  Functional Status Assessment  Patient has had a recent decline in their functional status and demonstrates the ability to make significant improvements in function in a reasonable and predictable amount of time.  Equipment Recommendations  BSC/3in1;Tub/shower seat;Other (comment)    Recommendations for Other Services       Precautions / Restrictions Precautions Precautions: Back Precaution Booklet Issued: No Restrictions Weight Bearing Restrictions: No Other Position/Activity Restrictions: No brace      Mobility Bed Mobility Overal bed mobility: Needs Assistance Bed Mobility: Rolling;Sidelying to Sit;Sit to Sidelying Rolling: Min assist Sidelying to sit: Mod assist     Sit to sidelying: Mod assist      Transfers Overall transfer level: Needs assistance Equipment used: Rolling walker (2 wheels) Transfers: Sit to/from Stand Sit to Stand: Mod assist                   Balance Overall balance assessment: Needs assistance Sitting-balance support: Feet supported Sitting balance-Leahy Scale: Fair     Standing balance support: Reliant on assistive device for balance Standing balance-Leahy Scale: Poor                             ADL either performed or assessed with clinical judgement   ADL Overall ADL's : Needs assistance/impaired Eating/Feeding: Independent;Bed level   Grooming: Wash/dry hands;Wash/dry face;Set up;Bed level   Upper Body Bathing: Minimal assistance;Bed level   Lower Body Bathing: Maximal assistance;Bed level   Upper Body Dressing : Minimal assistance;Sitting   Lower Body Dressing: Maximal assistance;Sit to/from stand   Toilet Transfer: Minimal assistance;Stand-pivot;Rolling walker (2 wheels)                   Vision Patient Visual Report: No change from baseline       Perception Perception Perception: Not tested   Praxis Praxis Praxis: Not tested    Pertinent Vitals/Pain Pain Assessment: Faces Faces Pain Scale: Hurts worst Pain Location: Back Pain Descriptors / Indicators: Operative site guarding;Grimacing;Guarding;Sharp;Shooting;Spasm Pain Intervention(s): Monitored during session;Premedicated before session     Hand Dominance Right   Extremity/Trunk Assessment Upper Extremity Assessment Upper Extremity Assessment: RUE deficits/detail RUE Deficits / Details: RCT and plans for TSA RUE Sensation: WNL RUE Coordination: WNL   Lower Extremity Assessment Lower Extremity Assessment: Defer to PT evaluation   Cervical / Trunk Assessment Cervical / Trunk Assessment: Back Surgery   Communication Communication Communication: No difficulties  Cognition Arousal/Alertness: Awake/alert Behavior During Therapy: WFL for tasks assessed/performed Overall Cognitive Status: Within Functional Limits for tasks assessed                                                         Home Living Family/patient expects to be discharged to:: Private residence Living Arrangements: Spouse/significant other Available Help at Discharge: Family Type of Home: House Home Access: Stairs to enter Technical brewer of Steps: 3   Home Layout: One level     Bathroom Shower/Tub: Teacher, early years/pre: Standard Bathroom Accessibility: Yes How Accessible: Accessible via walker Home Equipment: [place] (2 wheels);Cane - single point          Prior Functioning/Environment Prior Level of Function : Independent/Modified Independent             Mobility Comments: used a Radio producer for community mobility. ADLs Comments: No assist        OT Problem List: Decreased activity tolerance;Pain;Impaired balance (sitting and/or standing)      OT Treatment/Interventions: Self-care/ADL training;Balance training;Therapeutic activities;Patient/family education    OT Goals(Current goals can be found in the care plan section) Acute Rehab OT Goals Patient Stated Goal: Get through this and return home OT Goal Formulation: With patient Time For Goal Achievement: 05/21/21 Potential to Achieve Goals: Good  OT Frequency: Min 2X/week   Barriers to D/C:    none noted       Co-evaluation              AM-PAC OT "6 Clicks" Daily Activity     Outcome Measure Help from another person eating meals?: None Help from another person taking care of personal grooming?: None Help from another person toileting, which includes using toliet, bedpan, or urinal?: A Lot Help from another person bathing (including washing, rinsing, drying)?: A Lot Help from another person to put on and taking off regular upper body clothing?: A Little Help from another person to put on and taking off regular lower body clothing?: A Lot 6 Click Score: 17   End of Session Equipment Utilized During Treatment: Gait belt;Rolling walker (2 wheels) Nurse Communication: Mobility  status  Activity Tolerance: Patient limited by pain Patient left: in bed;with call bell/phone within reach;with family/visitor present  OT Visit Diagnosis: Unsteadiness on feet (R26.81);Pain                Time: 9381-8299 OT Time Calculation (min): 27 min Charges:  OT General Charges $OT Visit: 1 Visit OT Evaluation $OT Eval Moderate Complexity: 1 Mod OT Treatments $Self Care/Home Management : 8-22 mins  05/07/2021  RP, OTR/L  Acute Rehabilitation Services  Office:  [phone]   [name] 05/07/2021, 9:39 AM

## 2021-05-08 ENCOUNTER — Ambulatory Visit: Payer: PPO | Admitting: Urology

## 2021-05-08 MED ORDER — DEXAMETHASONE 4 MG PO TABS
4.0000 mg | ORAL_TABLET | Freq: Once | ORAL | Status: AC
  Administered 2021-05-08: 14:00:00 4 mg via ORAL
  Filled 2021-05-08: qty 1

## 2021-05-08 MED ORDER — DEXAMETHASONE 4 MG PO TABS
2.0000 mg | ORAL_TABLET | Freq: Two times a day (BID) | ORAL | Status: DC
  Administered 2021-05-08 – 2021-05-09 (×2): 2 mg via ORAL
  Filled 2021-05-08 (×2): qty 1

## 2021-05-08 MED ORDER — OXYCODONE HCL ER 10 MG PO T12A
20.0000 mg | EXTENDED_RELEASE_TABLET | Freq: Two times a day (BID) | ORAL | Status: DC
  Administered 2021-05-08 – 2021-05-09 (×3): 20 mg via ORAL
  Filled 2021-05-08 (×3): qty 2

## 2021-05-08 NOTE — Progress Notes (Signed)
Patient ID: [name], male   DOB: 10-06-1940, 80 y.o.   MRN: [id number] Vital signs are stable Patient is having significant pain in his back and also in his right shoulder Mobilization is very difficult for him I suspect much of this is inflammatory in nature I have advised that we will start him on some Decadron to see if this helps the inflammatory process Pain control seems to be difficult We will see how he does with oral pain control and I will add OxyContin 20 mg twice a day to see if we can decrease his reliance on the oxycodone tens

## 2021-05-08 NOTE — TOC Initial Note (Signed)
Transition of Care (TOC) - Initial/Assessment Note  Marvetta Gibbons RN,BSN Transitions of Care Unit 4NP (Non Trauma)- RN Case Manager See Treatment Team for direct Phone #    Patient Details  Name: [name] MRN: [id number] Date of Birth: 1940/10/15  Transition of Care [facility]) CM/SW Contact:    Dawayne Patricia, RN Phone Number: 05/08/2021, 4:17 PM  Clinical Narrative:                 Pt s/p decompression T12-L1 with TLIF, from home with wife. Noted recommendations for HHPT/OT- CM in to speak with pt regarding transition needs/plan.  Per conversation with pt at bedside (daughter also present)- plan if for pt to return home with wife. Pt reports he has needed DME at home - including RW, BSC, and shower chair. Daughter states family will provide transportation home.  Discussed HH recommendations- pt is agreeable and would like services- list provided for [facility] choice Per CMS guidelines from medicare.gov website with star ratings (copy placed in shadow chart) - pt and daughter would like to review with pt's wife before making decision.  CM will f/u for choice and [facility] referral prior to discharge.  MD will need to place HHPT/OT orders with [facility].   Address, phone #s and PCP all confirmed with pt in epic.   TOC will continue to follow.    Expected Discharge Plan: [place] Barriers to Discharge: Continued Medical Work up   Patient Goals and CMS Choice Patient states their goals for this hospitalization and ongoing recovery are:: return home CMS Medicare.gov Compare Post Acute Care list provided to:: Patient Choice offered to / list presented to : Patient, Adult Children, Spouse  Expected Discharge Plan and Services Expected Discharge Plan: [place]   Discharge Planning Services: CM Consult Post Acute Care Choice: [place] arrangements for the past 2 months: Single Family Home                 DME Arranged: N/A DME Agency: NA        HH Arranged: PT, OT          Prior Living Arrangements/Services Living arrangements for the past 2 months: Single Family Home Lives with:: Self, Spouse Patient language and need for interpreter reviewed:: Yes Do you feel safe going back to the place where you live?: Yes      Need for Family Participation in Patient Care: Yes (Comment) Care giver support system in place?: Yes (comment) Current home services: DME (RW, BSC, shower chair) Criminal Activity/Legal Involvement Pertinent to Current Situation/Hospitalization: No - Comment as needed  Activities of Daily Living Home Assistive Devices/Equipment: Cane (specify quad or straight) ADL Screening (condition at time of admission) Patient's cognitive ability adequate to safely complete daily activities?: Yes Is the patient deaf or have difficulty hearing?: No Does the patient have difficulty seeing, even when wearing glasses/contacts?: No Does the patient have difficulty concentrating, remembering, or making decisions?: No Patient able to express need for assistance with ADLs?: Yes Does the patient have difficulty dressing or bathing?: No Independently performs ADLs?: Yes (appropriate for developmental age) Does the patient have difficulty walking or climbing stairs?: Yes Weakness of Legs: Both Weakness of Arms/Hands: None  Permission Sought/Granted Permission sought to share information with : Facility Art therapist granted to share information with : Yes, Verbal Permission Granted     Permission granted to share info w AGENCY: [facility]  Emotional Assessment Appearance:: Appears stated age Attitude/Demeanor/Rapport: Engaged Affect (typically observed): Accepting, Appropriate, Pleasant Orientation: : Oriented to Self, Oriented to Place, Oriented to  Time, Oriented to Situation Alcohol / Substance Use: Not Applicable Psych Involvement: No (comment)  Admission diagnosis:  Lumbar stenosis with  neurogenic claudication [M48.062] Patient Active Problem List   Diagnosis Date Noted   Lumbar stenosis with neurogenic claudication 05/06/2021   Constipation    Left flank pain 11/01/2019   S/P right knee arthroscopy 08/10/2018   Acute tear lateral meniscus, right, initial encounter 08/03/2018   Acute medial meniscus tear, right, subsequent encounter 07/13/2018   Melena 03/28/2018   Closed fracture of lateral portion of left tibial plateau 03/09/2018   Aortic atherosclerosis ([place]) 11/05/2016   History of colonic polyps 06/16/2016   Pseudoarthrosis of lumbar spine 04/24/2014   Hx of adenomatous colonic polyps 11/22/2013   Prediabetes 10/12/2013   Fatigue 12/21/2012   Snoring 12/21/2012   Chronic back pain greater than 3 months duration 10/31/2012   Sinusitis 02/22/2012   GERD (gastroesophageal reflux disease) 02/22/2012   Hyperlipidemia 01/21/2012   HYPERTENSION, BENIGN 12/06/2009   ATRIAL FIBRILLATION, PAROXYSMAL 12/06/2009   PCP:  Kathyrn Drown, MD Pharmacy:   [id number] - [place], [address] [place] [address] Phone: [phone] Fax: [phone]     Social Determinants of Health (SDOH) Interventions    Readmission Risk Interventions No flowsheet data found.

## 2021-05-08 NOTE — Progress Notes (Signed)
Physical Therapy Treatment Patient Details Name: [name] MRN: [id number] DOB: 01/03/1941 Today's Date: 05/08/2021   History of Present Illness 80 yo male s/p decompression T12-L1 with TLIF, pedicle fixation from T9 L1-L5 with posterior and posterolateral arthrodesis T9-L2. PMH includes skin cancer, chronic back pain with history of scoliosis and multiple back surgeries, DVT/PE, HTN, HLD, PAF.    PT Comments    Pt reporting 3/10 pain at rest, once mobilizing pt nearly crying due to operative pain which pt describes as burning. Pt ambulatory for very short room distance, this is all pt tolerated given pain. PT instructed pt in LE exercises to promote circulation and maintain strength, pt expresses understanding. PT to continue to follow.     Recommendations for follow up therapy are one component of a multi-disciplinary discharge planning process, led by the attending physician.  Recommendations may be updated based on patient status, additional functional criteria and insurance authorization.  Follow Up Recommendations  Home health PT (may need to consider ST-SNF if mobility remains this limited)     Assistance Recommended at Discharge Frequent or constant Supervision/Assistance  Equipment Recommendations  None recommended by PT    Recommendations for Other Services       Precautions / Restrictions Precautions Precautions: Back Precaution Comments: reviewed log roll technique Restrictions Other Position/Activity Restrictions: No brace     Mobility  Bed Mobility Overal bed mobility: Needs Assistance Bed Mobility: Rolling;Sidelying to Sit;Sit to Sidelying Rolling: Min assist Sidelying to sit: Mod assist     Sit to sidelying: Mod assist General bed mobility comments: mod assist for log roll to/from EOB, trunk elevation off of bed, repositioning with return to supine    Transfers Overall transfer level: Needs assistance Equipment used: Rolling walker (2  wheels) Transfers: Sit to/from Stand Sit to Stand: Min assist           General transfer comment: assist for power up and steadying upon standing.    Ambulation/Gait Ambulation/Gait assistance: Min assist Gait Distance (Feet): 12 Feet Assistive device: Rolling walker (2 wheels) Gait Pattern/deviations: Step-through pattern;Decreased stride length;Trunk flexed Gait velocity: decr     General Gait Details: steadying assist, cues for upright posture and RW use   Stairs             Wheelchair Mobility    Modified Rankin (Stroke Patients Only)       Balance Overall balance assessment: Needs assistance Sitting-balance support: Feet supported Sitting balance-Leahy Scale: Fair     Standing balance support: Reliant on assistive device for balance Standing balance-Leahy Scale: Poor                              Cognition Arousal/Alertness: Awake/alert Behavior During Therapy: WFL for tasks assessed/performed Overall Cognitive Status: Within Functional Limits for tasks assessed                                 General Comments: pt very limited by pain        Exercises General Exercises - Lower Extremity Ankle Circles/Pumps: AROM;Both;5 reps;Supine Short Arc Quad: AROM;10 reps;Both;Supine    General Comments        Pertinent Vitals/Pain Pain Assessment: Faces Faces Pain Scale: Hurts whole lot Pain Location: back Pain Descriptors / Indicators: Operative site guarding;Grimacing;Moaning Pain Intervention(s): Limited activity within patient's tolerance;Monitored during session;Repositioned    Home Living  Prior Function            PT Goals (current goals can now be found in the care plan section) Acute Rehab PT Goals PT Goal Formulation: With patient/family Time For Goal Achievement: 05/21/21 Potential to Achieve Goals: Good Progress towards PT goals: Progressing toward goals     Frequency    Min 5X/week      PT Plan Current plan remains appropriate    Co-evaluation              AM-PAC PT "6 Clicks" Mobility   Outcome Measure  Help needed turning from your back to your side while in a flat bed without using bedrails?: A Lot Help needed moving from lying on your back to sitting on the side of a flat bed without using bedrails?: A Lot Help needed moving to and from a bed to a chair (including a wheelchair)?: A Lot Help needed standing up from a chair using your arms (e.g., wheelchair or bedside chair)?: A Little Help needed to walk in hospital room?: A Little Help needed climbing 3-5 steps with a railing? : A Lot 6 Click Score: 14    End of Session   Activity Tolerance: Patient limited by pain Patient left: in bed;with call bell/phone within reach;with bed alarm set;with family/visitor present Nurse Communication: Mobility status PT Visit Diagnosis: Other abnormalities of gait and mobility (R26.89);Difficulty in walking, not elsewhere classified (R26.2);Pain Pain - Right/Left:  (mid) Pain - part of body:  (back)     Time: 9242-6834 PT Time Calculation (min) (ACUTE ONLY): 11 min  Charges:  $Therapeutic Activity: 8-22 mins                    Stacie Glaze, PT DPT Acute Rehabilitation Services Pager [phone]  Office [phone]    Louis Matte 05/08/2021, 3:49 PM

## 2021-05-09 MED ORDER — OXYCODONE HCL ER 20 MG PO T12A: 20.0000 mg | EXTENDED_RELEASE_TABLET | Freq: Two times a day (BID) | ORAL | 0 refills | Status: DC

## 2021-05-09 MED ORDER — OXYCODONE HCL 10 MG PO TABS
10.0000 mg | ORAL_TABLET | ORAL | 0 refills | Status: DC | PRN
Start: 2021-05-09 — End: 2021-07-01

## 2021-05-09 MED ORDER — DEXAMETHASONE 1 MG PO TABS: ORAL_TABLET | ORAL | 0 refills | Status: DC

## 2021-05-09 MED ORDER — METHOCARBAMOL 500 MG PO TABS: 500.0000 mg | ORAL_TABLET | Freq: Four times a day (QID) | ORAL | 3 refills | Status: DC | PRN

## 2021-05-09 NOTE — Progress Notes (Signed)
Pt discharged home. He is eager to go. No PIV to remove. All room belongings packed. Incision swabbed with betadine and dressed with ABD and medipore tape. Daughter at bedside and educated on this and other incision instructions. Additional supplies sent. Pt taken to private vehicle in wheelchair with all belongings.   Justice Rocher, RN

## 2021-05-09 NOTE — Progress Notes (Signed)
Physical Therapy Treatment Patient Details Name: [name] MRN: [id number] DOB: 10-Oct-1940 Today's Date: 05/09/2021   History of Present Illness 80 yo male s/p decompression T12-L1 with TLIF, pedicle fixation from T9 L1-L5 with posterior and posterolateral arthrodesis T9-L2. PMH includes skin cancer, chronic back pain with history of scoliosis and multiple back surgeries, DVT/PE, HTN, HLD, PAF.    PT Comments    Pt premedicated prior to session. Daughter present and engaged. He required min guard assist bed mobility, min guard assist transfers, and min guard assist ambulation 10' x 2 with RW. Mobility/gait distance limited by pain, primarily R hip muscle spasms. Pt can become impulsive, demonstrating decreased safety awareness, when muscle spasms occur. Pt returned to bed at end of session.    Recommendations for follow up therapy are one component of a multi-disciplinary discharge planning process, led by the attending physician.  Recommendations may be updated based on patient status, additional functional criteria and insurance authorization.  Follow Up Recommendations  Home health PT     Assistance Recommended at Discharge Frequent or constant Supervision/Assistance  Equipment Recommendations  None recommended by PT    Recommendations for Other Services       Precautions / Restrictions Precautions Precautions: Back Restrictions Weight Bearing Restrictions: No Other Position/Activity Restrictions: No brace     Mobility  Bed Mobility Overal bed mobility: Needs Assistance Bed Mobility: Rolling;Sidelying to Sit;Sit to Sidelying Rolling: Modified independent (Device/Increase time) Sidelying to sit: Min guard     Sit to sidelying: Min guard General bed mobility comments: assist for safety, no physical assist needed    Transfers Overall transfer level: Needs assistance Equipment used: Rolling walker (2 wheels) Transfers: Sit to/from Stand Sit to Stand: Min guard            General transfer comment: Cues for hand placement. Pt able to power up without physical assist.    Ambulation/Gait Ambulation/Gait assistance: Min guard Gait Distance (Feet): 10 Feet (x 2) Assistive device: Rolling walker (2 wheels) Gait Pattern/deviations: Step-through pattern;Decreased stride length;Trunk flexed Gait velocity: decreased     General Gait Details: cues for posture and proximity to RW. Pt can be impulsive with decreased safety awareness when muscle spasms occur. Distance limited by pain.   Stairs             Wheelchair Mobility    Modified Rankin (Stroke Patients Only)       Balance Overall balance assessment: Needs assistance Sitting-balance support: Feet supported;No upper extremity supported Sitting balance-Leahy Scale: Fair     Standing balance support: Reliant on assistive device for balance;Bilateral upper extremity supported;During functional activity Standing balance-Leahy Scale: Poor                              Cognition Arousal/Alertness: Awake/alert Behavior During Therapy: WFL for tasks assessed/performed Overall Cognitive Status: Within Functional Limits for tasks assessed                                          Exercises      General Comments        Pertinent Vitals/Pain Pain Assessment: Faces Faces Pain Scale: Hurts worst Pain Location: back, R hip Pain Descriptors / Indicators: Operative site guarding;Grimacing;Moaning;Spasm Pain Intervention(s): Repositioned;Limited activity within patient's tolerance;Monitored during session;Premedicated before session    Home Living  Prior Function            PT Goals (current goals can now be found in the care plan section) Progress towards PT goals: Progressing toward goals    Frequency    Min 5X/week      PT Plan Current plan remains appropriate    Co-evaluation               AM-PAC PT "6 Clicks" Mobility   Outcome Measure  Help needed turning from your back to your side while in a flat bed without using bedrails?: None Help needed moving from lying on your back to sitting on the side of a flat bed without using bedrails?: A Little Help needed moving to and from a bed to a chair (including a wheelchair)?: A Little Help needed standing up from a chair using your arms (e.g., wheelchair or bedside chair)?: A Little Help needed to walk in hospital room?: A Little Help needed climbing 3-5 steps with a railing? : A Lot 6 Click Score: 18    End of Session Equipment Utilized During Treatment: Gait belt Activity Tolerance: Patient limited by pain Patient left: in bed;with call bell/phone within reach;with family/visitor present Nurse Communication: Mobility status PT Visit Diagnosis: Other abnormalities of gait and mobility (R26.89);Difficulty in walking, not elsewhere classified (R26.2);Pain Pain - Right/Left: Right Pain - part of body: Hip     Time: 4742-5956 PT Time Calculation (min) (ACUTE ONLY): 14 min  Charges:  $Gait Training: 8-22 mins                     [name], PT  Office # [phone] Pager [phone]    [name] 05/09/2021, 10:46 AM

## 2021-05-09 NOTE — Progress Notes (Signed)
Occupational Therapy Treatment Patient Details Name: [name] MRN: [id number] DOB: February 15, 1941 Today's Date: 05/09/2021   History of present illness 80 yo male s/p decompression T12-L1 with TLIF, pedicle fixation from T9 L1-L5 with posterior and posterolateral arthrodesis T9-L2. PMH includes skin cancer, chronic back pain with history of scoliosis and multiple back surgeries, DVT/PE, HTN, HLD, PAF.   OT comments  Patient continues to be limited by pain.  Patient reports he was fine until about 6am, then began having extreme pain again.  Session was limited to bed mobility and lateral scoot higher in the bed for breakfast.  OT to continue efforts and advance out of bed as patient status improves.  Continue to recommend home with family and assist as needed.     Recommendations for follow up therapy are one component of a multi-disciplinary discharge planning process, led by the attending physician.  Recommendations may be updated based on patient status, additional functional criteria and insurance authorization.    Follow Up Recommendations  Home health OT    Assistance Recommended at Discharge Intermittent Supervision/Assistance  Equipment Recommendations  None recommended by OT    Recommendations for Other Services      Precautions / Restrictions Precautions Precautions: Back Restrictions Other Position/Activity Restrictions: No brace       Mobility Bed Mobility Overal bed mobility: Needs Assistance Bed Mobility: Rolling;Sidelying to Sit;Sit to Sidelying Rolling: Min assist Sidelying to sit: Mod assist     Sit to sidelying: Mod assist      Transfers                   General transfer comment: deferred     Balance Overall balance assessment: Needs assistance Sitting-balance support: Feet supported Sitting balance-Leahy Scale: Fair                                     ADL either performed or assessed with clinical judgement   ADL    Eating/Feeding: Independent;Bed level   Grooming: Wash/dry hands;Wash/dry face;Set up;Sitting       Lower Body Bathing: Maximal assistance;Bed level                                                                                                                                  Pertinent Vitals/ Pain       Faces Pain Scale: Hurts worst Pain Location: back Pain Descriptors / Indicators: Operative site guarding;Grimacing;Moaning Pain Intervention(s): Monitored during session                                                          Frequency  Min 2X/week        Progress  Toward Goals  OT Goals(current goals can now be found in the care plan section)  Progress towards OT goals: Progressing toward goals  Acute Rehab OT Goals Patient Stated Goal: Stop hurting so much OT Goal Formulation: With patient Time For Goal Achievement: 05/21/21 Potential to Achieve Goals: Good  Plan Discharge plan remains appropriate    Co-evaluation                 AM-PAC OT "6 Clicks" Daily Activity     Outcome Measure   Help from another person eating meals?: None Help from another person taking care of personal grooming?: None Help from another person toileting, which includes using toliet, bedpan, or urinal?: A Lot Help from another person bathing (including washing, rinsing, drying)?: A Lot Help from another person to put on and taking off regular upper body clothing?: A Little Help from another person to put on and taking off regular lower body clothing?: A Lot 6 Click Score: 17    End of Session    OT Visit Diagnosis: Unsteadiness on feet (R26.81);Pain Pain - Right/Left:  (back)   Activity Tolerance Patient limited by pain   Patient Left in bed;with call bell/phone within reach   Nurse Communication          Time: 0810-0826 OT Time Calculation (min): 16 min  Charges: OT General  Charges $OT Visit: 1 Visit OT Treatments $Self Care/Home Management : 8-22 mins  05/09/2021  RP, OTR/L  Acute Rehabilitation Services  Office:  [phone]   [name] 05/09/2021, 8:30 AM

## 2021-05-09 NOTE — TOC Transition Note (Signed)
Transition of Care (TOC) - CM/SW Discharge Note Marvetta Gibbons RN,BSN Transitions of Care Unit 4NP (Non Trauma)- RN Case Manager See Treatment Team for direct Phone #    Patient Details  Name: SKYLAR PRIEST MRN: [id number] Date of Birth: July 01, 1940  Transition of Care [facility]) CM/SW Contact:  Dawayne Patricia, RN Phone Number: 05/09/2021, 4:23 PM   Clinical Narrative:    Pt stable for transition home, orders placed for HHPTOT, CM followed up with pt and daughter at bedside for [facility] choice- they have selected [facility] for [facility] services.   Wife downstairs to assist with transport home.   Call made to Amy with [facility] for [facility] referral- referral has been accepted for HHPTOT they will contact pt to schedule   Final next level of care: Home w Home Health Services Barriers to Discharge: Barriers Resolved   Patient Goals and CMS Choice Patient states their goals for this hospitalization and ongoing recovery are:: return home CMS Medicare.gov Compare Post Acute Care list provided to:: Patient Choice offered to / list presented to : Patient, Adult Children, Spouse  Discharge Placement               Home w/ [facility]        Discharge Plan and Services   Discharge Planning Services: CM Consult Post Acute Care Choice: Home Health          DME Arranged: N/A DME Agency: NA       HH Arranged: PT, OT HH Agency: [place] Date [place]: 05/09/21 Time HH Agency Contacted: 1600 Representative spoke with at [place]: Amy  Social Determinants of Health ([place]) Interventions     Readmission Risk Interventions No flowsheet data found.

## 2021-05-09 NOTE — Discharge Summary (Signed)
Physician Discharge Summary  Patient ID: [name] MRN: [id number] DOB/AGE: 80-Jul-1942 80 y.o.  Admit date: 05/06/2021 Discharge date: 05/09/2021  Admission Diagnoses: T12-L1 stenosis with neurogenic claudication, lumbar radiculopathy  Discharge Diagnoses: T12-L1 stenosis with neurogenic claudication, lumbar radiculopathy Principal Problem:   Lumbar stenosis with neurogenic claudication   Discharged Condition: fair  Hospital Course: Patient was admitted to undergo surgical decompression and stabilization at T12-L1 with fixation to T9 he tolerated surgery well however he has been plagued with severe right hip pain moderate back pain he has been started on some Decadron and given a Decadron taper to go home with.  He is also required oxygen content 20 mg twice a day to control his pain along with oxycodone 10 to help for breakthrough pain.  He will be on a bowel regimen at home.  Consults: None  Significant Diagnostic Studies: None  Treatments: surgery: See op note  Discharge Exam: Blood pressure (!) 146/75, pulse 83, temperature 97.7 F (36.5 C), temperature source Oral, resp. rate (!) 31, height 5\' 6"  (1.676 m), weight 78 kg, SpO2 95 %. Incision is clean and dry motor function is intact  Disposition: Discharge disposition: 01-Home or Self Care       Discharge Instructions     Diet - low sodium heart healthy   Complete by: As directed    Discharge wound care:   Complete by: As directed    Paint incision area with Betadine.  Keep dry gauze over incision as long as some drainage exists.  If drainage stops may leave wound open to air.  Okay to shower.   Incentive spirometry RT   Complete by: As directed    Increase activity slowly   Complete by: As directed       Allergies as of 05/09/2021       Reactions   Sectral [acebutolol Hcl] Other (See Comments)   Unknown reaction   Sulfonamide Derivatives Other (See Comments)   Runs blood pressure up   Zocor  [simvastatin] Other (See Comments)   Joint pain        Medication List     TAKE these medications    amLODipine 10 MG tablet Commonly known as: NORVASC TAKE 1 TABLET BY MOUTH EVERY DAY   dexamethasone 1 MG tablet Commonly known as: DECADRON 2 tablets twice daily for 5 days, one tablet twice daily for 3 days, one tablet daily for 3 days.   Eliquis 5 MG Tabs tablet Generic drug: apixaban TAKE 1 TABLET BY MOUTH TWICE A DAY. STOP PRADAXA   indapamide 2.5 MG tablet Commonly known as: LOZOL TAKE 1 TABLET BY MOUTH EVERY DAY   latanoprost 0.005 % ophthalmic solution Commonly known as: XALATAN Place 1 drop into both eyes at bedtime.   Metamucil Smooth Texture 58.6 % powder Generic drug: psyllium Take 1 packet by mouth at bedtime. What changed:  when to take this reasons to take this   methocarbamol 500 MG tablet Commonly known as: ROBAXIN Take 1 tablet (500 mg total) by mouth every 6 (six) hours as needed for muscle spasms.   Oxycodone HCl 10 MG Tabs Take 1 tablet (10 mg total) by mouth every 4 (four) hours as needed for severe pain. What changed:  how much to take how to take this when to take this reasons to take this additional instructions   oxyCODONE 20 mg 12 hr tablet Commonly known as: OXYCONTIN Take 1 tablet (20 mg total) by mouth every 12 (twelve) hours. What changed: You  were already taking a medication with the same name, and this prescription was added. Make sure you understand how and when to take each.   pantoprazole 40 MG tablet Commonly known as: PROTONIX Take 1 tablet (40 mg total) by mouth daily before breakfast. What changed:  when to take this reasons to take this   polyethylene glycol powder 17 GM/SCOOP powder Commonly known as: GLYCOLAX/MIRALAX Take 8.5 g by mouth every other day.   pravastatin 80 MG tablet Commonly known as: PRAVACHOL TAKE 1 TABLET BY MOUTH EVERYDAY AT BEDTIME   ramipril 10 MG capsule Commonly known as:  ALTACE TAKE 1 CAPSULE BY MOUTH 2 TIMES DAILY.   TYLENOL PM EXTRA STRENGTH PO Take 1 tablet by mouth at bedtime as needed (sleep).               Discharge Care Instructions  (From admission, onward)           Start     Ordered   05/09/21 0000  Discharge wound care:       Comments: Paint incision area with Betadine.  Keep dry gauze over incision as long as some drainage exists.  If drainage stops may leave wound open to air.  Okay to shower.   05/09/21 1415             Signed: Blanchie Dessert Eller Sweis 05/09/2021, 2:17 PM

## 2021-05-11 ENCOUNTER — Emergency Department (HOSPITAL_COMMUNITY): Payer: PPO

## 2021-05-11 ENCOUNTER — Encounter (HOSPITAL_COMMUNITY): Payer: Self-pay | Admitting: Emergency Medicine

## 2021-05-11 ENCOUNTER — Other Ambulatory Visit: Payer: Self-pay

## 2021-05-11 ENCOUNTER — Emergency Department (HOSPITAL_COMMUNITY)
Admission: EM | Admit: 2021-05-11 | Discharge: 2021-05-11 | Disposition: A | Discharge status: Home / Self Care | Payer: PPO | Attending: Emergency Medicine | Admitting: Emergency Medicine

## 2021-05-11 DIAGNOSIS — M25551 Pain in right hip: Secondary | ICD-10-CM | POA: Insufficient documentation

## 2021-05-11 DIAGNOSIS — Z85828 Personal history of other malignant neoplasm of skin: Secondary | ICD-10-CM | POA: Insufficient documentation

## 2021-05-11 DIAGNOSIS — Z79899 Other long term (current) drug therapy: Secondary | ICD-10-CM | POA: Diagnosis not present

## 2021-05-11 DIAGNOSIS — Z96651 Presence of right artificial knee joint: Secondary | ICD-10-CM | POA: Diagnosis not present

## 2021-05-11 DIAGNOSIS — R109 Unspecified abdominal pain: Secondary | ICD-10-CM | POA: Diagnosis not present

## 2021-05-11 DIAGNOSIS — I10 Essential (primary) hypertension: Secondary | ICD-10-CM | POA: Insufficient documentation

## 2021-05-11 DIAGNOSIS — K5900 Constipation, unspecified: Secondary | ICD-10-CM | POA: Diagnosis not present

## 2021-05-11 DIAGNOSIS — M25552 Pain in left hip: Secondary | ICD-10-CM | POA: Diagnosis not present

## 2021-05-11 DIAGNOSIS — R630 Anorexia: Secondary | ICD-10-CM

## 2021-05-11 DIAGNOSIS — Z872 Personal history of diseases of the skin and subcutaneous tissue: Secondary | ICD-10-CM | POA: Insufficient documentation

## 2021-05-11 DIAGNOSIS — Z87891 Personal history of nicotine dependence: Secondary | ICD-10-CM | POA: Diagnosis not present

## 2021-05-11 DIAGNOSIS — M25559 Pain in unspecified hip: Secondary | ICD-10-CM

## 2021-05-11 MED ORDER — CYCLOBENZAPRINE HCL 10 MG PO TABS: 10.0000 mg | ORAL_TABLET | Freq: Three times a day (TID) | ORAL | 0 refills | Status: DC | PRN

## 2021-05-11 NOTE — Discharge Instructions (Addendum)
Your x-ray shows no constipation Please eat more food - and drink lots of water Take miralax one to 2 doses daily.   ER for worsening symptoms.  You may benefit from taking Flexeril, 10 mg 3 times a day as needed for pain that comes and goes in your hip  Please follow the instructions from your spinal surgeon to follow-up with pain clinic

## 2021-05-11 NOTE — ED Triage Notes (Signed)
Pt to the ED [facility] with c/o constipation after having had back surgery last week.  Pt states he has not had a BM since 05/05/21.

## 2021-05-11 NOTE — ED Provider Notes (Signed)
[facility] EMERGENCY DEPARTMENT Provider Note   CSN: [id number] Arrival date & time: 05/11/21  1434     History Chief Complaint  Patient presents with   Constipation    [name] is a 80 y.o. male.   Constipation  This patient is an 80 year old male, he has a history of some degree of chronic pain over the last few years related to his back and his right hip, he underwent back surgery about 5 days ago by Dr. Ellene Route and since that time has had severe problem with constipation.  Prior to the surgery the patient was taking oxycodone, after the surgery this dose was significantly increased.  He feels like he needs to have a bowel movement, there is a very full rectal feeling that he has not been able to pass any stool.  He does report a history of severe constipation several months ago which caused him to self disimpact with his finger, he noted that that was very successful.  This time he has tried enemas, laxatives, suppositories but nothing has had any effect more than a couple of very small stool balls.  No abdominal pain no vomiting no fevers.  No numbness or weakness of the legs.  He has been ambulatory without difficulty.  Past Medical History:  Diagnosis Date   Arthritis    Cancer ([place])    skin   Carpal tunnel syndrome    Cataract    left eye   Chronic back pain    multiple back surgeries   Complication of anesthesia    pt states woke up during hemorroid surgery   DVT (deep venous thrombosis) ([place]) 03/2007   left leg    Dysrhythmia    GERD (gastroesophageal reflux disease)    takes Protonix daily   Glaucoma    History of colon polyps    History of hyperglycemia    History of MRSA infection 1992   HTN (hypertension)    takes  Ramipril daily   Hyperlipidemia    takes Pravastatin 3 times a week   Nocturia    Paroxysmal atrial fibrillation ([facility])    takes Pradaxa daily, Chads2vasc score 2(07/11/14)   Pneumonia    many yrs ago   Pulmonary embolism ([place]) 2008     Patient Active Problem List   Diagnosis Date Noted   Lumbar stenosis with neurogenic claudication 05/06/2021   Constipation    Left flank pain 11/01/2019   S/P right knee arthroscopy 08/10/2018   Acute tear lateral meniscus, right, initial encounter 08/03/2018   Acute medial meniscus tear, right, subsequent encounter 07/13/2018   Melena 03/28/2018   Closed fracture of lateral portion of left tibial plateau 03/09/2018   Aortic atherosclerosis ([place]) 11/05/2016   History of colonic polyps 06/16/2016   Pseudoarthrosis of lumbar spine 04/24/2014   Hx of adenomatous colonic polyps 11/22/2013   Prediabetes 10/12/2013   Fatigue 12/21/2012   Snoring 12/21/2012   Chronic back pain greater than 3 months duration 10/31/2012   Sinusitis 02/22/2012   GERD (gastroesophageal reflux disease) 02/22/2012   Hyperlipidemia 01/21/2012   HYPERTENSION, BENIGN 12/06/2009   ATRIAL FIBRILLATION, PAROXYSMAL 12/06/2009    Past Surgical History:  Procedure Laterality Date   APPLICATION OF ROBOTIC ASSISTANCE FOR SPINAL PROCEDURE  05/06/2021   Procedure: APPLICATION OF ROBOTIC ASSISTANCE FOR SPINAL PROCEDURE;  Surgeon: Kristeen Miss, MD;  Location: [place];  Service: Neurosurgery;;   BACK SURGERY     BIOPSY  04/01/2018   Procedure: BIOPSY;  Surgeon: Rogene Houston,  MD;  [facility];  Service: Endoscopy;;  gastric   CARDIAC CATHETERIZATION  05/18/2006   CARPAL TUNNEL RELEASE Right    cataract surgery Right    cervical and lumbar surgery     COLONOSCOPY  04/24/2011   Procedure: COLONOSCOPY;  Surgeon: Rogene Houston, MD;  [facility];  Service: Endoscopy;  Laterality: N/A;  1200   COLONOSCOPY N/A 08/13/2016   Procedure: COLONOSCOPY;  Surgeon: Rogene Houston, MD;  [facility];  Service: Endoscopy;  Laterality: N/A;  1200   ESOPHAGOGASTRODUODENOSCOPY  03/10/2012   Procedure: ESOPHAGOGASTRODUODENOSCOPY (EGD);  Surgeon: Rogene Houston, MD;  [facility];   Service: Endoscopy;  Laterality: N/A;  325   ESOPHAGOGASTRODUODENOSCOPY (EGD) WITH PROPOFOL N/A 04/01/2018   Procedure: ESOPHAGOGASTRODUODENOSCOPY (EGD) WITH PROPOFOL;  Surgeon: Rogene Houston, MD;  [facility];  Service: Endoscopy;  Laterality: N/A;   EYE SURGERY     HARDWARE REMOVAL N/A 04/24/2014   Procedure: REMOVAL OF SACRAL INSTRUMENTATION WITH [place];  Surgeon: Kristeen Miss, MD;  Location: [place] [facility];  Service: Neurosurgery;  Laterality: N/A;   HEMORRHOIDECTOMY WITH HEMORRHOID BANDING     JOINT REPLACEMENT     KNEE ARTHROSCOPY Right    KNEE ARTHROSCOPY WITH MEDIAL MENISECTOMY Right 08/03/2018   Procedure: RIGHT KNEE ARTHROSCOPY WITH PARTIAL MEDIAL MENISCECTOMY;  Surgeon: Leandrew Koyanagi, MD;  Location: [place];  Service: Orthopedics;  Laterality: Right;   KNEE SURGERY     left   LASIK     POLYPECTOMY  08/13/2016   Procedure: POLYPECTOMY;  Surgeon: Rogene Houston, MD;  [facility];  Service: Endoscopy;;  colon   PVI  01/24/2010   afib ablation   TRANSFORAMINAL LUMBAR INTERBODY FUSION (TLIF) WITH PEDICLE SCREW FIXATION 1 LEVEL N/A 05/06/2021   Procedure: Transforaminal Lumbar Interbody Fusion  - Thoracic twelve-Lumbar one fixation Thoracic nine to Lumbar one with methacrylate augmentation with mazor;  Surgeon: Kristeen Miss, MD;  Location: [place];  Service: Neurosurgery;  Laterality: N/A;       Family History  Problem Relation Age of Onset   Hypertension Mother    Transient ischemic attack Mother    Heart disease Father    Diabetes Brother    Healthy Daughter    Colon cancer Sister    Hypertension Other    Heart disease Other     Social History   Tobacco Use   Smoking status: Former    Packs/day: 0.25    Years: 3.00    Pack years: 0.75    Types: Cigarettes    Quit date: 03/30/1969    Years since quitting: 52.1   Smokeless tobacco: Never  Vaping Use   Vaping Use: Never used  Substance Use Topics   Alcohol use: Yes     Alcohol/week: 14.0 standard drinks    Types: 7 Glasses of wine, 7 Shots of liquor per week    Comment: Jack nightly   Drug use: No    Home Medications Prior to Admission medications   Medication Sig Start Date End Date Taking? Authorizing Provider  cyclobenzaprine (FLEXERIL) 10 MG tablet Take 1 tablet (10 mg total) by mouth 3 (three) times daily as needed for muscle spasms. 05/11/21  Yes Noemi Chapel, MD  amLODipine (NORVASC) 10 MG tablet TAKE 1 TABLET BY MOUTH EVERY DAY 05/09/21   Kathyrn Drown, MD  dexamethasone (DECADRON) 1 MG tablet 2 tablets twice daily for 5 days, one tablet twice daily  for 3 days, one tablet daily for 3 days. 05/09/21   Kristeen Miss, MD  diphenhydrAMINE-APAP, sleep, (TYLENOL PM EXTRA STRENGTH PO) Take 1 tablet by mouth at bedtime as needed (sleep).    [provider]  ELIQUIS 5 MG TABS tablet TAKE 1 TABLET BY MOUTH TWICE A DAY. STOP PRADAXA 12/11/20   Kathyrn Drown, MD  indapamide (LOZOL) 2.5 MG tablet TAKE 1 TABLET BY MOUTH EVERY DAY 05/09/21   Luking, Elayne Snare, MD  latanoprost (XALATAN) 0.005 % ophthalmic solution Place 1 drop into both eyes at bedtime.    [provider]  methocarbamol (ROBAXIN) 500 MG tablet Take 1 tablet (500 mg total) by mouth every 6 (six) hours as needed for muscle spasms. 05/09/21   Kristeen Miss, MD  oxyCODONE (OXYCONTIN) 20 mg 12 hr tablet Take 1 tablet (20 mg total) by mouth every 12 (twelve) hours. 05/09/21   Kristeen Miss, MD  oxyCODONE 10 MG TABS Take 1 tablet (10 mg total) by mouth every 4 (four) hours as needed for severe pain. 05/09/21   Kristeen Miss, MD  pantoprazole (PROTONIX) 40 MG tablet Take 1 tablet (40 mg total) by mouth daily before breakfast. Patient taking differently: Take 40 mg by mouth 2 (two) times daily as needed (acid reflux). 04/29/20   Harvel Quale, MD  polyethylene glycol powder (GLYCOLAX/MIRALAX) 17 GM/SCOOP powder Take 8.5 g by mouth every other day. 01/14/21   Rogene Houston, MD   pravastatin (PRAVACHOL) 80 MG tablet TAKE 1 TABLET BY MOUTH EVERYDAY AT BEDTIME 10/16/20   Luking, Scott A, MD  psyllium (METAMUCIL SMOOTH TEXTURE) 58.6 % powder Take 1 packet by mouth at bedtime. Patient taking differently: Take 1 packet by mouth 3 (three) times daily as needed (constipation). 01/14/21   Rehman, Mechele Dawley, MD  ramipril (ALTACE) 10 MG capsule TAKE 1 CAPSULE BY MOUTH 2 TIMES DAILY. 05/01/21   Kathyrn Drown, MD    Allergies    Sectral [acebutolol hcl], Sulfonamide derivatives, and Zocor [simvastatin]  Review of Systems   Review of Systems  Gastrointestinal:  Positive for constipation.  All other systems reviewed and are negative.  Physical Exam Updated Vital Signs BP 132/74    Pulse 69    Temp 98.2 F (36.8 C) (Oral)    Resp 18    Ht 1.676 m (5\' 6" )    Wt 78 kg    SpO2 100%    BMI 27.76 kg/m   Physical Exam Vitals and nursing note reviewed.  Constitutional:      General: He is not in acute distress.    Appearance: He is well-developed.  HENT:     Head: Normocephalic and atraumatic.     Mouth/Throat:     Pharynx: No oropharyngeal exudate.  Eyes:     General: No scleral icterus.       Right eye: No discharge.        Left eye: No discharge.     Conjunctiva/sclera: Conjunctivae normal.     Pupils: Pupils are equal, round, and reactive to light.  Neck:     Thyroid: No thyromegaly.     Vascular: No JVD.  Cardiovascular:     Rate and Rhythm: Normal rate and regular rhythm.     Heart sounds: Normal heart sounds. No murmur heard.   No friction rub. No gallop.  Pulmonary:     Effort: Pulmonary effort is normal. No respiratory distress.     Breath sounds: Normal breath sounds. No wheezing or rales.  Abdominal:     General: Bowel sounds are normal. There is no distension.     Palpations: Abdomen is soft. There is no mass.     Tenderness: There is no abdominal tenderness.  Musculoskeletal:        General: No tenderness. Normal range of motion.     Cervical back:  Normal range of motion and neck supple.  Lymphadenopathy:     Cervical: No cervical adenopathy.  Skin:    General: Skin is warm and dry.     Findings: No erythema or rash.  Neurological:     Mental Status: He is alert.     Coordination: Coordination normal.     Comments: Totally normal strength and sensation of his bilateral lower extremities, normal reflexes at the knees bilaterally, straight leg raises 5 out of 5 in strength bilaterally, diffuse sensation to light touch is totally normal.  Gait is slightly antalgic  Psychiatric:        Behavior: Behavior normal.    ED Results / Procedures / Treatments   Labs (all labs ordered are listed, but only abnormal results are displayed) Labs Reviewed - No data to display  EKG None  Radiology DG Abdomen 1 View  Result Date: 05/11/2021 CLINICAL DATA:  Abdominal pain.  Possible fecal impaction. EXAM: ABDOMEN - 1 VIEW COMPARISON:  11/08/2020 CT FINDINGS: Thoracic and lumbar spine fixation. Nonobstructive bowel-gas pattern. Rectosigmoid gas, without significant stool burden. No gaseous distention of small bowel loops. No gross free intraperitoneal air. No abnormal abdominal calcifications. No appendicolith. IMPRESSION: No acute findings.  No evidence of fecal impaction. Electronically Signed   By: Abigail Miyamoto M.D.   On: 05/11/2021 16:12    Procedures Procedures   Medications Ordered in ED Medications - No data to display  ED Course  I have reviewed the triage vital signs and the nursing notes.  Pertinent labs & imaging results that were available during my care of the patient were reviewed by me and considered in my medical decision making (see chart for details).    MDM Rules/Calculators/A&P                          This patient presents to the ED for concern of constipation and rectal discomfort, this involves an extensive number of treatment options, and is a complaint that carries with it a high risk of complications and  morbidity.  The differential diagnosis includes constipation, slow transit constipation secondary to opiate use, rectal foreign body or mass but that seems less likely   Additional history obtained:  Additional history obtained from medical record and surgical records External records from outside source obtained and reviewed including confirming recent surgery   Lab Tests:  I Ordered, reviewed, and interpreted labs.  The pertinent results include: None   Imaging Studies ordered:  I ordered imaging studies including KUB of the abdomen I independently visualized and interpreted imaging which showed no fecal impaction I agree with the radiologist interpretation   Medicines ordered and prescription drug management:  I reviewed the patient's medications and his current prescriptions, I have counseled him on the use of his opiate medications and recommended that he use MiraLAX more regularly.  Critical Interventions:  Evaluation for possible fecal impaction and constipation, there is nothing found on the x-ray.  I did perform a rectal exam by digital method.  He did not have anything whatsoever in his rectum.  He is passing gas freely.   ED  Course:  The patient several times goings to a long and lengthy story about how his right hip has been giving him chronic discomfort.  This seems to come and go at times, it is hurting today.  It is not tender to my touch there is no overlying rash, his joints appear supple to passive range of motion.  I do not think it is a septic joint, he has been informed of his lack of constipation, he now states he has not been eating very much since surgery.   Reevaluation:  After the interventions noted above, I reevaluated the patient and found that they have :improved   Dispostion:  After consideration of the diagnostic results and the patients response to treatment feel that the patent would benefit from discharge Home   Final Clinical  Impression(s) / ED Diagnoses Final diagnoses:  No appetite  Hip pain    Rx / DC Orders ED Discharge Orders          Ordered    cyclobenzaprine (FLEXERIL) 10 MG tablet  3 times daily PRN        05/11/21 1720             Noemi Chapel, MD 05/11/21 1720

## 2021-05-14 MED FILL — Sodium Chloride IV Soln 0.9%: INTRAVENOUS | Qty: 1000 | Status: AC

## 2021-05-14 MED FILL — Heparin Sodium (Porcine) Inj 1000 Unit/ML: INTRAMUSCULAR | Qty: 30 | Status: AC

## 2021-05-15 ENCOUNTER — Telehealth: Payer: Self-pay | Admitting: Family Medicine

## 2021-05-15 ENCOUNTER — Other Ambulatory Visit: Payer: Self-pay | Admitting: Family Medicine

## 2021-05-15 DIAGNOSIS — Z7901 Long term (current) use of anticoagulants: Secondary | ICD-10-CM | POA: Diagnosis not present

## 2021-05-15 DIAGNOSIS — Z4789 Encounter for other orthopedic aftercare: Secondary | ICD-10-CM | POA: Diagnosis not present

## 2021-05-15 DIAGNOSIS — M48062 Spinal stenosis, lumbar region with neurogenic claudication: Secondary | ICD-10-CM | POA: Diagnosis not present

## 2021-05-15 DIAGNOSIS — I10 Essential (primary) hypertension: Secondary | ICD-10-CM | POA: Diagnosis not present

## 2021-05-15 DIAGNOSIS — R2681 Unsteadiness on feet: Secondary | ICD-10-CM | POA: Diagnosis not present

## 2021-05-15 NOTE — Telephone Encounter (Signed)
Patient just had back surgery but having hip pain. He is needing something for the hip pain. Please advise

## 2021-05-15 NOTE — Telephone Encounter (Signed)
Please advise. Thank you

## 2021-05-15 NOTE — Telephone Encounter (Signed)
Currently it appears that he is already on the OxyContin 20 mg twice daily I assume that is so? Also he should have oxycodone 10 mg that he can take as a supplement as long as its not causing excessive drowsiness Unfortunately I cannot add anything additional to his meds currently Please try to clarify what he is taking currently Hopefully that is helping some

## 2021-05-15 NOTE — Telephone Encounter (Signed)
I spoke with Tanya LPN Pt would be best off using pain med oxycodone 10 mg one q 4 hours prn pain  No more than 6 or 7 per day If drowsy do not take more (The meds he got from pharmacy was the plain oxycodone  If he is taking 2 pills twice a day that is only 40 mg per day which is kess than his usual amount of 10 mg 6/day which would be 60 mg)  If he is running out we may need to send in more ( if so send message back to me to handle)

## 2021-05-15 NOTE — Telephone Encounter (Signed)
Pt wife contacted and verbalized understanding. Pt has appt with "doctor that deals with hips" on Wednesday per wife. Wife states they do have enough pain meds at this time.

## 2021-05-15 NOTE — Telephone Encounter (Signed)
Pt wife states that pt did not pick up Oxycontin because pharmacy was out. Pt is taking 20 mg oxycodone q 12 hours. Also taking Methocarbamol and Tylenol prn. Therapy came out this morning but pt wife states pt is unable to do therapy. Pt wife states pain comes and goes. (Right hip)

## 2021-05-20 ENCOUNTER — Emergency Department (HOSPITAL_COMMUNITY)
Admission: EM | Admit: 2021-05-20 | Discharge: 2021-05-20 | Discharge status: Against Medical Advice | Payer: PPO | Attending: Emergency Medicine | Admitting: Emergency Medicine

## 2021-05-20 ENCOUNTER — Other Ambulatory Visit: Payer: Self-pay

## 2021-05-20 ENCOUNTER — Emergency Department (HOSPITAL_COMMUNITY): Payer: PPO

## 2021-05-20 ENCOUNTER — Encounter (HOSPITAL_COMMUNITY): Payer: Self-pay

## 2021-05-20 ENCOUNTER — Telehealth: Payer: Self-pay | Admitting: Family Medicine

## 2021-05-20 DIAGNOSIS — M25551 Pain in right hip: Secondary | ICD-10-CM | POA: Insufficient documentation

## 2021-05-20 DIAGNOSIS — M4326 Fusion of spine, lumbar region: Secondary | ICD-10-CM | POA: Diagnosis not present

## 2021-05-20 DIAGNOSIS — R4182 Altered mental status, unspecified: Secondary | ICD-10-CM | POA: Diagnosis not present

## 2021-05-20 DIAGNOSIS — R443 Hallucinations, unspecified: Secondary | ICD-10-CM | POA: Insufficient documentation

## 2021-05-20 DIAGNOSIS — Z20822 Contact with and (suspected) exposure to covid-19: Secondary | ICD-10-CM | POA: Insufficient documentation

## 2021-05-20 DIAGNOSIS — M545 Low back pain, unspecified: Secondary | ICD-10-CM | POA: Diagnosis not present

## 2021-05-20 DIAGNOSIS — Z5321 Procedure and treatment not carried out due to patient leaving prior to being seen by health care provider: Secondary | ICD-10-CM | POA: Diagnosis not present

## 2021-05-20 DIAGNOSIS — R410 Disorientation, unspecified: Secondary | ICD-10-CM | POA: Insufficient documentation

## 2021-05-20 LAB — COMPREHENSIVE METABOLIC PANEL
ALT: 23 U/L (ref 0–44)
AST: 26 U/L (ref 15–41)
Albumin: 3.5 g/dL (ref 3.5–5.0)
Alkaline Phosphatase: 86 U/L (ref 38–126)
Anion gap: 11 (ref 5–15)
BUN: 27 mg/dL — ABNORMAL HIGH (ref 8–23)
CO2: 27 mmol/L (ref 22–32)
Calcium: 9.1 mg/dL (ref 8.9–10.3)
Chloride: 89 mmol/L — ABNORMAL LOW (ref 98–111)
Creatinine, Ser: 0.97 mg/dL (ref 0.61–1.24)
GFR, Estimated: >60 mL/min (ref >60)
Glucose, Bld: 143 mg/dL — ABNORMAL HIGH (ref 70–99)
Potassium: 3.4 mmol/L — ABNORMAL LOW (ref 3.5–5.1)
Sodium: 127 mmol/L — ABNORMAL LOW (ref 135–145)
Total Bilirubin: 1 mg/dL (ref 0.3–1.2)
Total Protein: 6.8 g/dL (ref 6.5–8.1)

## 2021-05-20 LAB — CBC WITH DIFFERENTIAL/PLATELET
Abs Immature Granulocytes: 0.14 10*3/uL — ABNORMAL HIGH (ref 0.00–0.07)
Basophils Absolute: 0.1 10*3/uL (ref 0.0–0.1)
Basophils Relative: 0 %
Eosinophils Absolute: 0.1 10*3/uL (ref 0.0–0.5)
Eosinophils Relative: 1 %
HCT: 38.3 % — ABNORMAL LOW (ref 39.0–52.0)
Hemoglobin: 13.4 g/dL (ref 13.0–17.0)
Immature Granulocytes: 1 %
Lymphocytes Relative: 10 %
Lymphs Abs: 1.4 10*3/uL (ref 0.7–4.0)
MCH: 31.8 pg (ref 26.0–34.0)
MCHC: 35 g/dL (ref 30.0–36.0)
MCV: 91 fL (ref 80.0–100.0)
Monocytes Absolute: 1.2 10*3/uL — ABNORMAL HIGH (ref 0.1–1.0)
Monocytes Relative: 9 %
Neutro Abs: 10.6 10*3/uL — ABNORMAL HIGH (ref 1.7–7.7)
Neutrophils Relative %: 79 %
Platelets: 499 10*3/uL — ABNORMAL HIGH (ref 150–400)
RBC: 4.21 MIL/uL — ABNORMAL LOW (ref 4.22–5.81)
RDW: 12.2 % (ref 11.5–15.5)
WBC: 13.4 10*3/uL — ABNORMAL HIGH (ref 4.0–10.5)
nRBC: 0 % (ref 0.0–0.2)

## 2021-05-20 LAB — URINALYSIS, ROUTINE W REFLEX MICROSCOPIC
Bilirubin Urine: NEGATIVE
Glucose, UA: NEGATIVE mg/dL
Hgb urine dipstick: NEGATIVE
Ketones, ur: NEGATIVE mg/dL
Leukocytes,Ua: NEGATIVE
Nitrite: NEGATIVE
Protein, ur: 30 mg/dL — AB
Specific Gravity, Urine: 1.015 (ref 1.005–1.030)
pH: 6.5 (ref 5.0–8.0)

## 2021-05-20 LAB — RESP PANEL BY RT-PCR (FLU A&B, COVID) ARPGX2
Influenza A by PCR: NEGATIVE
Influenza B by PCR: NEGATIVE
SARS Coronavirus 2 by RT PCR: NEGATIVE

## 2021-05-20 LAB — URINALYSIS, MICROSCOPIC (REFLEX): Bacteria, UA: NONE SEEN

## 2021-05-20 LAB — CBG MONITORING, ED: Glucose-Capillary: 153 mg/dL — ABNORMAL HIGH (ref 70–99)

## 2021-05-20 LAB — LACTIC ACID, PLASMA: Lactic Acid, Venous: 1.4 mmol/L (ref 0.5–1.9)

## 2021-05-20 LAB — AMMONIA: Ammonia: <10 umol/L (ref 9–35)

## 2021-05-20 NOTE — ED Triage Notes (Signed)
Patient here with severe right hip pain before christmas. Had back surgery on 12/20 and has the hip pain since. Patient alert and oriented, states that his memory has gotten bad.

## 2021-05-20 NOTE — Telephone Encounter (Signed)
Will need office visit this week

## 2021-05-20 NOTE — Telephone Encounter (Signed)
Patient is having a lot of issues with sitting up,daughter states in pain but cant describe the pain ,non-stop crying and had back surgery two weeks ago. Wife and daughter wanting provider opinion on what to do?

## 2021-05-20 NOTE — ED Notes (Signed)
Patient states he is leaving. 

## 2021-05-20 NOTE — ED Provider Notes (Signed)
Emergency Medicine Provider Triage Evaluation Note  ARIEH BOGUE , a 81 y.o. male  was evaluated in triage.  Pt complains of severe back and right hip pain.  Status post lumbar surgery on 20 December.  He was released 2 days later.  Since that time he had severe pain in his right hip and whenever he tries to stand.  Also complaining of confusion and hallucination since that time.  He has been on the same pain medications for the past 3 years without change.  Review of Systems  Positive: Back pain  Negative: Hip pain  Physical Exam  BP 136/77 (BP Location: Left Arm)    Pulse 94    Temp 97.7 F (36.5 C)    Resp 16    SpO2 97%  Gen:   Awake, no distress   Resp:  Normal effort  MSK:   Moves extremities without difficulty  Other:    Medical Decision Making  Medically screening exam initiated at 3:45 PM.  Appropriate orders placed.  LENNARD CAPEK was informed that the remainder of the evaluation will be completed by another provider, this initial triage assessment does not replace that evaluation, and the importance of remaining in the ED until their evaluation is complete.     Margarita Mail, PA-C 50/53/97 [id number]    Lianne Cure, DO 19/37/90 [phone]

## 2021-05-20 NOTE — Telephone Encounter (Signed)
That is a good idea we can follow along with what is found out thank you

## 2021-05-20 NOTE — Telephone Encounter (Signed)
Daughter stated they just arrived at [facility] ER because the pain was just too much for him to continue with

## 2021-05-21 DIAGNOSIS — M25551 Pain in right hip: Secondary | ICD-10-CM | POA: Diagnosis not present

## 2021-05-23 DIAGNOSIS — M5416 Radiculopathy, lumbar region: Secondary | ICD-10-CM | POA: Diagnosis not present

## 2021-06-01 ENCOUNTER — Other Ambulatory Visit: Payer: Self-pay | Admitting: Family Medicine

## 2021-06-01 DIAGNOSIS — K219 Gastro-esophageal reflux disease without esophagitis: Secondary | ICD-10-CM

## 2021-06-16 DIAGNOSIS — M25511 Pain in right shoulder: Secondary | ICD-10-CM | POA: Diagnosis not present

## 2021-06-18 ENCOUNTER — Other Ambulatory Visit: Payer: Self-pay | Admitting: Family Medicine

## 2021-07-01 ENCOUNTER — Other Ambulatory Visit: Payer: Self-pay

## 2021-07-01 ENCOUNTER — Ambulatory Visit (INDEPENDENT_AMBULATORY_CARE_PROVIDER_SITE_OTHER): Payer: PPO | Admitting: Family Medicine

## 2021-07-01 VITALS — BP 125/75 | HR 85 | Temp 98.1°F | Ht 66.0 in | Wt 165.4 lb

## 2021-07-01 DIAGNOSIS — I10 Essential (primary) hypertension: Secondary | ICD-10-CM | POA: Diagnosis not present

## 2021-07-01 DIAGNOSIS — E876 Hypokalemia: Secondary | ICD-10-CM | POA: Diagnosis not present

## 2021-07-01 DIAGNOSIS — Z79891 Long term (current) use of opiate analgesic: Secondary | ICD-10-CM | POA: Diagnosis not present

## 2021-07-01 DIAGNOSIS — D72829 Elevated white blood cell count, unspecified: Secondary | ICD-10-CM | POA: Diagnosis not present

## 2021-07-01 MED ORDER — OXYCODONE HCL 10 MG PO TABS: ORAL_TABLET | ORAL | 0 refills | Status: DC

## 2021-07-01 NOTE — Progress Notes (Signed)
Subjective:    Patient ID: [name], male    DOB: 07-28-1940, 81 y.o.   MRN: [id number]  HPI  Patient wants to discuss going to pain management. Severe low back and hip pain This patient was seen today for chronic pain  The medication list was reviewed and updated.  Location of Pain for which the patient has been treated with regarding narcotics: Lumbar back pain  Onset of this pain: This been present for years had recent surgery.  Having severe amount of pain   -Compliance with medication: Currently only utilizing the medication 2 times per day it does help him when he takes it but had severe pain in the middle of the day  - Number patient states they take daily: Twice  -when was the last dose patient took?  Earlier today  The patient was advised the importance of maintaining medication and not using illegal substances with these.  Here for refills and follow up  The patient was educated that we can provide 3 monthly scripts for their medication, it is their responsibility to follow the instructions.  Side effects or complications from medications: He states when he is on high-dose medicine he tends to have a lot of constipation issues  Patient is aware that pain medications are meant to minimize the severity of the pain to allow their pain levels to improve to allow for better function. They are aware of that pain medications cannot totally remove their pain.  Due for UDT ( at least once per year) : On a yearly basis not currently  Scale of 1 to 10 ( 1 is least 10 is most) Your pain level without the medicine: 9 Your pain level with medication 6  Scale 1 to 10 ( 1-helps very little, 10 helps very well) How well does your pain medication reduce your pain so you can function better through out the day? 5  Quality of the pain: Severe throbbing aching pain discomfort in his lower back that radiates into his hips  Persistence of the pain: Present all the  time  Modifying factors: Worse with activity  Primary hypertension - Plan: CBC with Differential, Comprehensive Metabolic Panel (CMET)  Encounter for long-term use of opiate analgesic - Plan: CBC with Differential, Comprehensive Metabolic Panel (CMET)  Hypokalemia - Plan: CBC with Differential, Comprehensive Metabolic Panel (CMET)  Leukocytosis, unspecified type - Plan: CBC with Differential, Comprehensive Metabolic Panel (CMET)     Review of Systems     Objective:   Physical Exam  General-in no acute distress Eyes-no discharge Lungs-respiratory rate normal, CTA CV-no murmurs,RRR Extremities skin warm dry no edema Neuro grossly normal Behavior normal, alert Subjective low back pain has a long surgical scar walks with a cane      Assessment & Plan:   1. Primary hypertension Blood pressure decent control continue current measures - CBC with Differential - Comprehensive Metabolic Panel (CMET)  2. Encounter for long-term use of opiate analgesic We did discuss pain medicine discussed pain referral patient not interested in high-dose pain medicine Desires to take the edge off the pain but not be on higher medicines beyond potentially 3 or 4/day  I did communicate to him how he could stay off of this medicine if he is not wanting to be on any pain medicine but with his blood thinner he can take nothing more than Tylenol for his discomfort  We did discuss how high her pain dosing can have an increased risk of accidental death  as well as severe constipation issues  He will recheck in approximately 2 to 3 weeks to see how 3 pills/day we will do for him currently he is on 2 pills/day denies drowsiness with - CBC with Differential - Comprehensive Metabolic Panel (CMET)  3. Hypokalemia Check kidney function - CBC with Differential - Comprehensive Metabolic Panel (CMET)  4. Leukocytosis, unspecified type Recently WBC elevated make sure this is going back down to - CBC with  Differential - Comprehensive Metabolic Panel (CMET)

## 2021-07-03 DIAGNOSIS — L570 Actinic keratosis: Secondary | ICD-10-CM | POA: Diagnosis not present

## 2021-07-03 DIAGNOSIS — X32XXXD Exposure to sunlight, subsequent encounter: Secondary | ICD-10-CM | POA: Diagnosis not present

## 2021-07-03 DIAGNOSIS — C44212 Basal cell carcinoma of skin of right ear and external auricular canal: Secondary | ICD-10-CM | POA: Diagnosis not present

## 2021-07-04 LAB — CBC WITH DIFFERENTIAL/PLATELET
Basophils Absolute: 0.1 10*3/uL (ref 0.0–0.2)
Basos: 1 %
EOS (ABSOLUTE): 0.1 10*3/uL (ref 0.0–0.4)
Eos: 1 %
Hematocrit: 42.5 % (ref 37.5–51.0)
Hemoglobin: 13.7 g/dL (ref 13.0–17.7)
Immature Grans (Abs): 0 10*3/uL (ref 0.0–0.1)
Immature Granulocytes: 1 %
Lymphocytes Absolute: 2 10*3/uL (ref 0.7–3.1)
Lymphs: 22 %
MCH: 30.9 pg (ref 26.6–33.0)
MCHC: 32.2 g/dL (ref 31.5–35.7)
MCV: 96 fL (ref 79–97)
Monocytes Absolute: 0.8 10*3/uL (ref 0.1–0.9)
Monocytes: 9 %
Neutrophils Absolute: 5.8 10*3/uL (ref 1.4–7.0)
Neutrophils: 66 %
Platelets: 301 10*3/uL (ref 150–450)
RBC: 4.44 x10E6/uL (ref 4.14–5.80)
RDW: 12.9 % (ref 11.6–15.4)
WBC: 8.8 10*3/uL (ref 3.4–10.8)

## 2021-07-04 LAB — COMPREHENSIVE METABOLIC PANEL
ALT: 14 IU/L (ref 0–44)
AST: 18 IU/L (ref 0–40)
Albumin/Globulin Ratio: 1.9 (ref 1.2–2.2)
Albumin: 4.6 g/dL (ref 3.7–4.7)
Alkaline Phosphatase: 88 IU/L (ref 44–121)
BUN/Creatinine Ratio: 19 (ref 10–24)
BUN: 21 mg/dL (ref 8–27)
Bilirubin Total: 0.4 mg/dL (ref 0.0–1.2)
CO2: 24 mmol/L (ref 20–29)
Calcium: 9.6 mg/dL (ref 8.6–10.2)
Chloride: 102 mmol/L (ref 96–106)
Creatinine, Ser: 1.11 mg/dL (ref 0.76–1.27)
Globulin, Total: 2.4 g/dL (ref 1.5–4.5)
Glucose: 111 mg/dL — ABNORMAL HIGH (ref 70–99)
Potassium: 4.2 mmol/L (ref 3.5–5.2)
Sodium: 142 mmol/L (ref 134–144)
Total Protein: 7 g/dL (ref 6.0–8.5)
eGFR: 67 mL/min/{1.73_m2} (ref >59)

## 2021-07-09 ENCOUNTER — Ambulatory Visit (HOSPITAL_COMMUNITY)
Admission: RE | Admit: 2021-07-09 | Discharge: 2021-07-09 | Disposition: A | Discharge status: Home / Self Care | Payer: PPO | Source: Ambulatory Visit | Attending: Family Medicine | Admitting: Family Medicine

## 2021-07-09 ENCOUNTER — Telehealth: Payer: Self-pay | Admitting: *Deleted

## 2021-07-09 ENCOUNTER — Other Ambulatory Visit: Payer: Self-pay

## 2021-07-09 DIAGNOSIS — R0781 Pleurodynia: Secondary | ICD-10-CM

## 2021-07-09 NOTE — Telephone Encounter (Signed)
Pt returned call and verbalized understanding  

## 2021-07-09 NOTE — Telephone Encounter (Signed)
1 view chest x-ray with left ribs x-rays stat via [facility]

## 2021-07-09 NOTE — Telephone Encounter (Signed)
STAT orders placed. Left message to return call

## 2021-07-11 ENCOUNTER — Encounter: Payer: Self-pay | Admitting: Family Medicine

## 2021-07-11 ENCOUNTER — Ambulatory Visit (INDEPENDENT_AMBULATORY_CARE_PROVIDER_SITE_OTHER): Payer: PPO | Admitting: Family Medicine

## 2021-07-11 ENCOUNTER — Ambulatory Visit (HOSPITAL_COMMUNITY)
Admission: RE | Admit: 2021-07-11 | Discharge: 2021-07-11 | Disposition: A | Discharge status: Home / Self Care | Payer: PPO | Source: Ambulatory Visit | Attending: Family Medicine | Admitting: Family Medicine

## 2021-07-11 ENCOUNTER — Other Ambulatory Visit: Payer: Self-pay

## 2021-07-11 VITALS — BP 110/60 | HR 83 | Temp 98.1°F | Ht 66.0 in | Wt 169.0 lb

## 2021-07-11 DIAGNOSIS — M542 Cervicalgia: Secondary | ICD-10-CM | POA: Diagnosis not present

## 2021-07-11 DIAGNOSIS — M4313 Spondylolisthesis, cervicothoracic region: Secondary | ICD-10-CM | POA: Diagnosis not present

## 2021-07-11 DIAGNOSIS — E041 Nontoxic single thyroid nodule: Secondary | ICD-10-CM | POA: Diagnosis not present

## 2021-07-11 DIAGNOSIS — S199XXA Unspecified injury of neck, initial encounter: Secondary | ICD-10-CM | POA: Diagnosis not present

## 2021-07-11 DIAGNOSIS — M4692 Unspecified inflammatory spondylopathy, cervical region: Secondary | ICD-10-CM | POA: Diagnosis not present
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
## 2021-07-11 NOTE — Assessment & Plan Note (Signed)
Sending for stat CT cervical spine for further evaluation.

## 2021-07-14 DIAGNOSIS — M25511 Pain in right shoulder: Secondary | ICD-10-CM | POA: Diagnosis not present

## 2021-07-14 DIAGNOSIS — M25552 Pain in left hip: Secondary | ICD-10-CM | POA: Diagnosis not present

## 2021-07-17 ENCOUNTER — Other Ambulatory Visit: Payer: Self-pay

## 2021-07-17 ENCOUNTER — Ambulatory Visit (INDEPENDENT_AMBULATORY_CARE_PROVIDER_SITE_OTHER): Payer: PPO | Admitting: Family Medicine

## 2021-07-17 VITALS — BP 135/77 | HR 78 | Temp 98.6°F | Ht 66.0 in | Wt 172.0 lb

## 2021-07-17 DIAGNOSIS — M549 Dorsalgia, unspecified: Secondary | ICD-10-CM | POA: Diagnosis not present

## 2021-07-17 DIAGNOSIS — I10 Essential (primary) hypertension: Secondary | ICD-10-CM | POA: Diagnosis not present

## 2021-07-17 DIAGNOSIS — G8929 Other chronic pain: Secondary | ICD-10-CM | POA: Diagnosis not present

## 2021-07-17 MED ORDER — OXYCODONE HCL 10 MG PO TABS: ORAL_TABLET | ORAL | 0 refills | Status: DC

## 2021-07-17 MED ORDER — APIXABAN 5 MG PO TABS
ORAL_TABLET | ORAL | 5 refills | Status: DC
Start: 2021-07-17 — End: 2022-03-24

## 2021-07-17 NOTE — Patient Instructions (Signed)
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
## 2021-07-30 ENCOUNTER — Telehealth: Payer: Self-pay | Admitting: Family Medicine

## 2021-07-30 DIAGNOSIS — R0781 Pleurodynia: Secondary | ICD-10-CM

## 2021-07-30 NOTE — Telephone Encounter (Signed)
Patient confirmed his pain was with ribs on th left side. Xray left ribs ordered in Epic. Patient notified. ?
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
## 2021-07-31 ENCOUNTER — Other Ambulatory Visit: Payer: Self-pay

## 2021-07-31 ENCOUNTER — Ambulatory Visit (HOSPITAL_COMMUNITY)
Admission: RE | Admit: 2021-07-31 | Discharge: 2021-07-31 | Disposition: A | Discharge status: Home / Self Care | Payer: PPO | Source: Ambulatory Visit | Attending: Family Medicine | Admitting: Family Medicine

## 2021-07-31 DIAGNOSIS — R0781 Pleurodynia: Secondary | ICD-10-CM | POA: Insufficient documentation

## 2021-07-31 DIAGNOSIS — Z85828 Personal history of other malignant neoplasm of skin: Secondary | ICD-10-CM | POA: Diagnosis not present

## 2021-07-31 DIAGNOSIS — L570 Actinic keratosis: Secondary | ICD-10-CM | POA: Diagnosis not present

## 2021-07-31 DIAGNOSIS — Z08 Encounter for follow-up examination after completed treatment for malignant neoplasm: Secondary | ICD-10-CM | POA: Diagnosis not present

## 2021-07-31 DIAGNOSIS — X32XXXD Exposure to sunlight, subsequent encounter: Secondary | ICD-10-CM | POA: Diagnosis not present

## 2021-08-04 ENCOUNTER — Other Ambulatory Visit: Payer: Self-pay | Admitting: Family Medicine

## 2021-08-08 DIAGNOSIS — Z6828 Body mass index (BMI) 28.0-28.9, adult: Secondary | ICD-10-CM | POA: Diagnosis not present

## 2021-08-08 DIAGNOSIS — I10 Essential (primary) hypertension: Secondary | ICD-10-CM | POA: Diagnosis not present

## 2021-08-08 DIAGNOSIS — M5416 Radiculopathy, lumbar region: Secondary | ICD-10-CM | POA: Diagnosis not present

## 2021-08-11 ENCOUNTER — Encounter (INDEPENDENT_AMBULATORY_CARE_PROVIDER_SITE_OTHER): Payer: Self-pay | Admitting: *Deleted

## 2021-08-11 DIAGNOSIS — M25552 Pain in left hip: Secondary | ICD-10-CM | POA: Diagnosis not present

## 2021-08-11 DIAGNOSIS — M25511 Pain in right shoulder: Secondary | ICD-10-CM | POA: Diagnosis not present

## 2021-08-18 DIAGNOSIS — M25511 Pain in right shoulder: Secondary | ICD-10-CM | POA: Diagnosis not present

## 2021-08-25 DIAGNOSIS — H16223 Keratoconjunctivitis sicca, not specified as Sjogren's, bilateral: Secondary | ICD-10-CM | POA: Diagnosis not present

## 2021-08-25 DIAGNOSIS — H2512 Age-related nuclear cataract, left eye: Secondary | ICD-10-CM | POA: Diagnosis not present

## 2021-08-25 DIAGNOSIS — H401132 Primary open-angle glaucoma, bilateral, moderate stage: Secondary | ICD-10-CM | POA: Diagnosis not present

## 2021-09-10 DIAGNOSIS — S46011A Strain of muscle(s) and tendon(s) of the rotator cuff of right shoulder, initial encounter: Secondary | ICD-10-CM | POA: Diagnosis not present

## 2021-09-15 DIAGNOSIS — L570 Actinic keratosis: Secondary | ICD-10-CM | POA: Diagnosis not present

## 2021-09-15 DIAGNOSIS — Z85828 Personal history of other malignant neoplasm of skin: Secondary | ICD-10-CM | POA: Diagnosis not present

## 2021-09-15 DIAGNOSIS — X32XXXD Exposure to sunlight, subsequent encounter: Secondary | ICD-10-CM | POA: Diagnosis not present

## 2021-09-15 DIAGNOSIS — Z08 Encounter for follow-up examination after completed treatment for malignant neoplasm: Secondary | ICD-10-CM | POA: Diagnosis not present

## 2021-09-25 ENCOUNTER — Ambulatory Visit (INDEPENDENT_AMBULATORY_CARE_PROVIDER_SITE_OTHER): Payer: PPO | Admitting: Gastroenterology

## 2021-09-29 ENCOUNTER — Other Ambulatory Visit (INDEPENDENT_AMBULATORY_CARE_PROVIDER_SITE_OTHER): Payer: Self-pay

## 2021-09-29 ENCOUNTER — Encounter (INDEPENDENT_AMBULATORY_CARE_PROVIDER_SITE_OTHER): Payer: Self-pay

## 2021-09-29 ENCOUNTER — Encounter (INDEPENDENT_AMBULATORY_CARE_PROVIDER_SITE_OTHER): Payer: Self-pay | Admitting: Gastroenterology

## 2021-09-29 ENCOUNTER — Ambulatory Visit (INDEPENDENT_AMBULATORY_CARE_PROVIDER_SITE_OTHER): Payer: PPO | Admitting: Gastroenterology

## 2021-09-29 ENCOUNTER — Telehealth (INDEPENDENT_AMBULATORY_CARE_PROVIDER_SITE_OTHER): Payer: Self-pay

## 2021-09-29 VITALS — BP 130/76 | HR 76 | Temp 97.7°F | Ht 66.0 in | Wt 180.0 lb

## 2021-09-29 DIAGNOSIS — Z86010 Personal history of colonic polyps: Secondary | ICD-10-CM

## 2021-09-29 DIAGNOSIS — K219 Gastro-esophageal reflux disease without esophagitis: Secondary | ICD-10-CM

## 2021-09-29 DIAGNOSIS — K5903 Drug induced constipation: Secondary | ICD-10-CM

## 2021-09-29 MED ORDER — PEG 3350-KCL-NA BICARB-NACL 420 G PO SOLR: 4000.0000 mL | ORAL | 0 refills | Status: DC

## 2021-09-29 NOTE — Progress Notes (Signed)
Referring Provider: Kathyrn Drown, MD Primary Care Physician:  Kathyrn Drown, MD Primary GI Physician: Rehman  Chief Complaint  Patient presents with   Constipation    Patient arrives to discuss constipation. Takes 1 pack of metamucil at bedtime. Takes 4 pain pills a day due to back pain and shoulder pain.    HPI:   [name] is a 81 y.o. male with past medical history of arthritis, skin cancer, carpal tunnel syndrome, chronic back pain, DVT, GERD, glaucoma, HTN, HLD.   Patient presenting today for follow up of constipation and to schedule surveillance colonoscopy.   Last seen august 2022 with c/o constipation at that time. Advised to continue high fiber diet with metamucil QHS and miralax 1/2 scoop every other day or daily if needed. Advised against senokot or ex lax. Surveillance Colonoscopy due March/April 2023.  States that he is taking pain medication about every 3 hours for chronic back pain and shoulder pain, he is taking metamucil nightly, having a BM daily without having to strain. No longer using miralax as he felt he did not need it. He denies any abdominal pain, rectal bleeding or melena. No nausea or vomiting. Appetite is good, weight is stable. 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 Does a lot of fruits in his diet. GERD is well controlled with his Protonix, tries to take it every other day. He has tried to come off of it but will have reflux symptoms if he does, denies dysphagia or odynophagia.   Last Colonoscopy:March 2018, 5 small tubular adenomas Last Endoscopy:  - Normal proximal esophagus. - Longitudinal lines and circumferential rings in mid and distal esophagus. Biopsied. - Z-line irregular, 38 cm from the incisors. - 2 cm hiatal hernia. - Multiple gastric polyps. Biopsied. - Normal cardia, antrum and pylorus. - Normal duodenal bulb and second portion of the duodenum.  Past  Medical History:  Diagnosis Date   Arthritis    Cancer ([place])    skin   Carpal tunnel syndrome    Cataract    left eye   Chronic back pain    multiple back surgeries   Complication of anesthesia    pt states woke up during hemorroid surgery   DVT (deep venous thrombosis) ([place]) 03/2007   left leg    Dysrhythmia    GERD (gastroesophageal reflux disease)    takes Protonix daily   Glaucoma    History of colon polyps    History of hyperglycemia    History of MRSA infection 1992   HTN (hypertension)    takes  Ramipril daily   Hyperlipidemia    takes Pravastatin 3 times a week   Nocturia    Paroxysmal atrial fibrillation (HCC)    takes Pradaxa daily, Chads2vasc score 2(07/11/14)   Pneumonia    many yrs ago   Pulmonary embolism ([place]) 2008    Past Surgical History:  Procedure Laterality Date   APPLICATION OF ROBOTIC ASSISTANCE FOR SPINAL PROCEDURE  05/06/2021   Procedure: APPLICATION OF ROBOTIC ASSISTANCE FOR SPINAL PROCEDURE;  Surgeon: Kristeen Miss, MD;  Location: [facility];  Service: Neurosurgery;;   BACK SURGERY     BIOPSY  04/01/2018   Procedure: BIOPSY;  Surgeon: Rogene Houston, MD;  [facility];  Service: Endoscopy;;  gastric   CARDIAC CATHETERIZATION  05/18/2006   CARPAL TUNNEL RELEASE Right    cataract  surgery Right    cervical and lumbar surgery     COLONOSCOPY  04/24/2011   Procedure: COLONOSCOPY;  Surgeon: Rogene Houston, MD;  [facility];  Service: Endoscopy;  Laterality: N/A;  1200   COLONOSCOPY N/A 08/13/2016   Procedure: COLONOSCOPY;  Surgeon: Rogene Houston, MD;  [facility];  Service: Endoscopy;  Laterality: N/A;  1200   ESOPHAGOGASTRODUODENOSCOPY  03/10/2012   Procedure: ESOPHAGOGASTRODUODENOSCOPY (EGD);  Surgeon: Rogene Houston, MD;  [facility];  Service: Endoscopy;  Laterality: N/A;  325   ESOPHAGOGASTRODUODENOSCOPY (EGD) WITH PROPOFOL N/A 04/01/2018   Procedure: ESOPHAGOGASTRODUODENOSCOPY (EGD) WITH  PROPOFOL;  Surgeon: Rogene Houston, MD;  [facility];  Service: Endoscopy;  Laterality: N/A;   EYE SURGERY     HARDWARE REMOVAL N/A 04/24/2014   Procedure: REMOVAL OF SACRAL INSTRUMENTATION WITH [place];  Surgeon: Kristeen Miss, MD;  Location: [facility];  Service: Neurosurgery;  Laterality: N/A;   HEMORRHOIDECTOMY WITH HEMORRHOID BANDING     JOINT REPLACEMENT     KNEE ARTHROSCOPY Right    KNEE ARTHROSCOPY WITH MEDIAL MENISECTOMY Right 08/03/2018   Procedure: RIGHT KNEE ARTHROSCOPY WITH PARTIAL MEDIAL MENISCECTOMY;  Surgeon: Leandrew Koyanagi, MD;  Location: [facility];  Service: Orthopedics;  Laterality: Right;   KNEE SURGERY     left   LASIK     POLYPECTOMY  08/13/2016   Procedure: POLYPECTOMY;  Surgeon: Rogene Houston, MD;  [facility];  Service: Endoscopy;;  colon   PVI  01/24/2010   afib ablation   TRANSFORAMINAL LUMBAR INTERBODY FUSION (TLIF) WITH PEDICLE SCREW FIXATION 1 LEVEL N/A 05/06/2021   Procedure: Transforaminal Lumbar Interbody Fusion  - Thoracic twelve-Lumbar one fixation Thoracic nine to Lumbar one with methacrylate augmentation with mazor;  Surgeon: Kristeen Miss, MD;  Location: [facility];  Service: Neurosurgery;  Laterality: N/A;    Current Outpatient Medications  Medication Sig Dispense Refill   amLODipine (NORVASC) 10 MG tablet TAKE 1 TABLET BY MOUTH EVERY DAY 90 tablet 1   apixaban (ELIQUIS) 5 MG TABS tablet TAKE 1 TABLET BY MOUTH TWICE A DAY. STOP PRADAXA 60 tablet 5   diphenhydrAMINE-APAP, sleep, (TYLENOL PM EXTRA STRENGTH PO) Take 1 tablet by mouth at bedtime as needed (sleep).     indapamide (LOZOL) 2.5 MG tablet TAKE 1 TABLET BY MOUTH EVERY DAY 90 tablet 1   latanoprost (XALATAN) 0.005 % ophthalmic solution Place 1 drop into both eyes at bedtime.     Oxycodone HCl 10 MG TABS Take one four times a day 120 tablet 0   Oxycodone HCl 10 MG TABS One 4 times daily as needed for pain 120 tablet 0   Oxycodone HCl 10 MG TABS Take one 4  times daily as needed for pain 120 tablet 0   pantoprazole (PROTONIX) 40 MG tablet TAKE 1 TABLET BY MOUTH DAILY BEFORE BREAKFAST 90 tablet 1   pravastatin (PRAVACHOL) 80 MG tablet TAKE 1 TABLET BY MOUTH EVERYDAY AT BEDTIME 90 tablet 1   psyllium (METAMUCIL SMOOTH TEXTURE) 58.6 % powder Take 1 packet by mouth at bedtime.     ramipril (ALTACE) 10 MG capsule TAKE 1 CAPSULE BY MOUTH TWICE A DAY 180 capsule 0   methocarbamol (ROBAXIN) 500 MG tablet Take 1 tablet (500 mg total) by mouth every 6 (six) hours as needed for muscle spasms. (Patient not taking: Reported on 09/29/2021) 40 tablet 3   polyethylene glycol powder (GLYCOLAX/MIRALAX) 17 GM/SCOOP powder Take 8.5 g  by mouth every other day. (Patient not taking: Reported on 09/29/2021)     No current facility-administered medications for this visit.    Allergies as of 09/29/2021 - Review Complete 09/29/2021  Allergen Reaction Noted   Sectral [acebutolol hcl] Other (See Comments) 09/14/2012   Sulfonamide derivatives Other (See Comments)    Zocor [simvastatin] Other (See Comments) 09/14/2012    Family History  Problem Relation Age of Onset   Hypertension Mother    Transient ischemic attack Mother    Heart disease Father    Diabetes Brother    Healthy Daughter    Colon cancer Sister    Hypertension Other    Heart disease Other     Social History   Socioeconomic History   Marital status: Married    Spouse name: Not on file   Number of children: Not on file   Years of education: Not on file   Highest education level: Not on file  Occupational History   Occupation: retired  Tobacco Use   Smoking status: Former    Packs/day: 0.25    Years: 3.00    Pack years: 0.75    Types: Cigarettes    Quit date: 03/30/1969    Years since quitting: 52.5    Passive exposure: Past   Smokeless tobacco: Never  Vaping Use   Vaping Use: Never used  Substance and Sexual Activity   Alcohol use: Yes    Alcohol/week: 14.0 standard drinks    Types: 7  Glasses of wine, 7 Shots of liquor per week    Comment: Jack nightly   Drug use: No   Sexual activity: Yes  Other Topics Concern   Not on file  Social History Narrative   Not on file   Social Determinants of Health   Financial Resource Strain: Low Risk    Difficulty of Paying Living Expenses: Not hard at all  Food Insecurity: No Food Insecurity   Worried About Charity fundraiser in the Last Year: Never true   [place] in the Last Year: Never true  Transportation Needs: No Transportation Needs   Lack of Transportation (Medical): No   Lack of Transportation (Non-Medical): No  Physical Activity: Inactive   Days of Exercise per Week: 0 days   Minutes of Exercise per Session: 0 min  Stress: No Stress Concern Present   Feeling of Stress : Not at all  Social Connections: Moderately Isolated   Frequency of Communication with Friends and Family: Once a week   Frequency of Social Gatherings with Friends and Family: More than three times a week   Attends Religious Services: Never   Marine scientist or Organizations: No   Attends Music therapist: Never   Marital Status: Married   Review of systems General: negative for malaise, night sweats, fever, chills, weight loss Neck: Negative for lumps, goiter, pain and significant neck swelling Resp: Negative for cough, wheezing, dyspnea at rest CV: Negative for chest pain, leg swelling, palpitations, orthopnea GI: denies melena, hematochezia, nausea, vomiting, diarrhea, dysphagia, odyonophagia, early satiety or unintentional weight loss. +constipation MSK: Negative for joint pain or swelling, back pain, and muscle pain. Derm: Negative for itching or rash Psych: Denies depression, anxiety, memory loss, confusion. No homicidal or suicidal ideation.  Heme: Negative for prolonged bleeding, and swollen nodes. +easy bruising (on eliquis) Endocrine: Negative for cold or heat intolerance, polyuria, polydipsia and  goiter. Neuro: negative for tremor, gait imbalance, syncope and seizures. The remainder of the  review of systems is noncontributory.  Physical Exam: BP 130/76 (BP Location: Left Arm, Patient Position: Sitting, Cuff Size: Large)   Pulse 76   Temp 97.7 F (36.5 C) (Oral)   Ht '5\' 6"'$  (1.676 m)   Wt 180 lb (81.6 kg)   BMI 29.05 kg/m  General:   Alert and oriented. No distress noted. Pleasant and cooperative.  Head:  Normocephalic and atraumatic. Eyes:  Conjuctiva clear without scleral icterus. Mouth:  Oral mucosa pink and moist. Good dentition. No lesions. Heart: Normal rate and rhythm, s1 and s2 heart sounds present.  Lungs: Clear lung sounds in all lobes. Respirations equal and unlabored. Abdomen:  +BS, soft, non-tender and non-distended. No rebound or guarding. No HSM or masses noted. Derm: No palmar erythema or jaundice Msk:  Symmetrical without gross deformities. Normal posture. Extremities:  Without edema. Neurologic:  Alert and  oriented x4 Psych:  Alert and cooperative. Normal mood and affect.  Invalid input(s): 6 MONTHS   ASSESSMENT: [name] is a 81 y.o. male presenting today for routine follow up of constipation and to schedule surveillance colonoscopy.  Constipation secondary to chronic opiate use,  well controlled on metamucil 4g nightly. Having 1 BM per day without straining. Denies rectal bleeding or melena. Can continue with metamucil QHS with good result. Encouraged to continue high fiber diet and staying well hydrated.  Last colonoscopy march 2018, hx of tubular adenomas, will schedule patient for updated surveillance colonoscopy. Indications, risks and benefits of procedure discussed in detail with patient. Patient verbalized understanding and is in agreement to proceed with Colonoscopy at this time.   GERD well controlled on protonix '40mg'$  every other day. Appetite is good, weight is stable. No breakthrough symptoms on PPI, denies dysphagia or odynophagia.    PLAN:  Continue with metamucil 4g nightly  2. Schedule colonoscopy-(2 day prep, on chronic opiates, endo 3) 3. Continue protonix '40mg'$  every other day with good result 4. Stay well  hydrated with diet high in fruits, veggies,whole grains  All questions were answered, patient verbalized understanding and is in agreement with plan as outlined above.   Follow Up: 1 year  Heavenleigh Petruzzi L. Alver Sorrow, MSN, APRN, AGNP-C Adult-Gerontology Nurse Practitioner [facility]

## 2021-09-29 NOTE — Patient Instructions (Signed)
Continue to drink plenty of water and eat a high fiber diet ?Can continue metamucil as this is working well for you ?We will get you scheduled for colonoscopy ?Please continue your protonix, you can keep taking this every other day as you are doing since symptoms are well controlled ?Please let me know if you have any new or worsening GI symptoms ? ?Follow up 1 year ?

## 2021-09-29 NOTE — Telephone Encounter (Signed)
LeighAnn Lore Polka, CMA  

## 2021-10-01 ENCOUNTER — Ambulatory Visit (HOSPITAL_COMMUNITY)
Admission: RE | Admit: 2021-10-01 | Discharge: 2021-10-01 | Disposition: A | Discharge status: Home / Self Care | Payer: PPO | Source: Ambulatory Visit | Attending: Nurse Practitioner | Admitting: Nurse Practitioner

## 2021-10-01 ENCOUNTER — Encounter (HOSPITAL_COMMUNITY): Payer: Self-pay | Admitting: Nurse Practitioner

## 2021-10-01 VITALS — BP 160/68 | HR 69 | Ht 66.0 in | Wt 176.8 lb

## 2021-10-01 DIAGNOSIS — I10 Essential (primary) hypertension: Secondary | ICD-10-CM | POA: Diagnosis not present

## 2021-10-01 DIAGNOSIS — Z7901 Long term (current) use of anticoagulants: Secondary | ICD-10-CM | POA: Insufficient documentation

## 2021-10-01 DIAGNOSIS — I4891 Unspecified atrial fibrillation: Secondary | ICD-10-CM | POA: Diagnosis not present

## 2021-10-01 DIAGNOSIS — D6869 Other thrombophilia: Secondary | ICD-10-CM | POA: Diagnosis not present

## 2021-10-01 DIAGNOSIS — I480 Paroxysmal atrial fibrillation: Secondary | ICD-10-CM

## 2021-10-01 NOTE — Progress Notes (Signed)
Patient ID: [name], male   DOB: [birth year], 81 y.o.   MRN: [id number] ? ? ? ? ?Primary Care Physician: Kathyrn Drown, MD ?Referring Physician: Dr. Rayann Heman ? ? ?[name] is a 81 y.o. male with a h/o PAF, afib ablation 2011.  No afib other than around 12 hours on Christmas Day, 2017, after hearing about the death of his sister. He continues to take pradaxa on a  regular basis. ? ?F/u 07/15/16. He reports just a few flutters now and then. He remains very active and actually had a rupture of his bicep tendon last September after digging up and pitching a rock about the size of a "coconut.". Surgery was not recommended due to his age and it has repaired to some degree with time. He continues on pradaxa with last bmet drawn 06/02/16 with creatinine of 1.10. No bleeding issues.  ? ?F/u afib clinic, 09/08/17. No further afib since December 2017. No issues with anticoagulation. Last bmet with stable creatinine with at 0.96.his only complaint is that he can not do as much work outside as he was doing last Spring." I just give out and have to stop." No chest pain or shortness of breath. Has a freind that felt the same way and he was found to need a stent. His energy improved a lot after the procedure. ? ?Seeing pt in afib clinic 10/25/18  for c/o intermittent palpitations for the  last several weeks and fatigue. I had a virtual visit with pt 4/27 and he did not have these c/o's then. He was telling me about a fall from a ladder in the fall and broke his knee with LOC, "tore up left shoulder." He adds today that he feels that he has not been right since then. 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 I did a stress test lest year, it was read low risk but did have a few areas that ischemia could not be ruled out. He has some chest tightness with irregular heart beat. He is winded with activities. He is in SR today.  ? ?F/u in afib clinic, 09/18/20 for yearly  f/u. He reports that he has not had any afib. His  health has been ok for the last year, but his son in law died form covid last fall and his  daughter was very sick for a while as well. The daughter, who has a disability,  has moved back to his home which has placed stress on the father. He denise any chest pain or exertional dyspnea.  ? ?F/u in afib clinic. 10/01/21. No afib to report. 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 ? ?Today, he denies symptoms of palpitations,+ chest pain with palpitations,+ exertional  shortness of breath, orthopnea, PND, lower extremity edema, dizziness, presyncope, syncope, or neurologic sequela. + for exertional fatigue. The patient is tolerating medications without difficulties and is otherwise without complaint today.  ? ?Past Medical History:  ?Diagnosis Date  ? Arthritis   ? Cancer [facility])   ? skin  ? Carpal tunnel syndrome   ? Cataract   ? left eye  ? Chronic back pain   ? multiple back surgeries  ? Complication of anesthesia   ? pt states  woke up during hemorroid surgery  ? DVT (deep venous thrombosis) ([place]) 03/2007  ? left leg   ? Dysrhythmia   ? GERD (gastroesophageal reflux disease)   ? takes Protonix daily  ? Glaucoma   ? History of colon polyps   ? History of hyperglycemia   ? History of MRSA infection 1992  ? HTN (hypertension)   ? takes  Ramipril daily  ? Hyperlipidemia   ? takes Pravastatin 3 times a week  ? Nocturia   ? Paroxysmal atrial fibrillation (HCC)   ? takes Pradaxa daily, Chads2vasc score 2(07/11/14)  ? Pneumonia   ? many yrs ago  ? Pulmonary embolism ([place]) 2008  ? ?Past Surgical History:  ?Procedure Laterality Date  ? APPLICATION OF ROBOTIC ASSISTANCE FOR SPINAL PROCEDURE  05/06/2021  ? Procedure: APPLICATION OF ROBOTIC ASSISTANCE FOR SPINAL PROCEDURE;  Surgeon: Kristeen Miss, MD;  Location: [facility];  Service:  Neurosurgery;;  ? BACK SURGERY    ? BIOPSY  04/01/2018  ? Procedure: BIOPSY;  Surgeon: Rogene Houston, MD;  [facility];  Service: Endoscopy;;  gastric  ? CARDIAC CATHETERIZATION  05/18/2006  ? CARPAL TUNNEL RELEASE Right   ? cataract surgery Right   ? cervical and lumbar surgery    ? COLONOSCOPY  04/24/2011  ? Procedure: COLONOSCOPY;  Surgeon: Rogene Houston, MD;  [facility];  Service: Endoscopy;  Laterality: N/A;  1200  ? COLONOSCOPY N/A 08/13/2016  ? Procedure: COLONOSCOPY;  Surgeon: Rogene Houston, MD;  [facility];  Service: Endoscopy;  Laterality: N/A;  1200  ? ESOPHAGOGASTRODUODENOSCOPY  03/10/2012  ? Procedure: ESOPHAGOGASTRODUODENOSCOPY (EGD);  Surgeon: Rogene Houston, MD;  [facility];  Service: Endoscopy;  Laterality: N/A;  325  ? ESOPHAGOGASTRODUODENOSCOPY (EGD) WITH PROPOFOL N/A 04/01/2018  ? Procedure: ESOPHAGOGASTRODUODENOSCOPY (EGD) WITH PROPOFOL;  Surgeon: Rogene Houston, MD;  [facility];  Service: Endoscopy;  Laterality: N/A;  ? EYE SURGERY    ? HARDWARE REMOVAL N/A 04/24/2014  ? Procedure: REMOVAL OF SACRAL INSTRUMENTATION WITH [place];  Surgeon: Kristeen Miss, MD;  Location: [facility];  Service: Neurosurgery;  Laterality: N/A;  ? HEMORRHOIDECTOMY WITH HEMORRHOID BANDING    ? JOINT REPLACEMENT    ? KNEE ARTHROSCOPY Right   ? KNEE ARTHROSCOPY WITH MEDIAL MENISECTOMY Right 08/03/2018  ? Procedure: RIGHT KNEE ARTHROSCOPY WITH PARTIAL MEDIAL MENISCECTOMY;  Surgeon: Leandrew Koyanagi, MD;  Location: [facility];  Service: Orthopedics;  Laterality: Right;  ? KNEE SURGERY    ? left  ? LASIK    ? POLYPECTOMY  08/13/2016  ? Procedure: POLYPECTOMY;  Surgeon: Rogene Houston, MD;  [facility];  Service: Endoscopy;;  colon  ? PVI  01/24/2010  ? afib ablation  ? TRANSFORAMINAL LUMBAR INTERBODY FUSION (TLIF) WITH PEDICLE SCREW FIXATION 1 LEVEL N/A 05/06/2021  ? Procedure: Transforaminal Lumbar Interbody Fusion  -  Thoracic twelve-Lumbar one fixation Thoracic nine to Lumbar one with methacrylate augmentation with mazor;  Surgeon: Kristeen Miss, MD;  Location: [facility];  Service: Neurosurgery;  Laterality: N/A;  ? ? ?Current Outpatient Medications  ?Medication Sig Dispense Refill  ? Acetaminophen (TYLENOL ARTHRITIS PAIN PO) Taking 4 tablets by mouth daily for pain    ? amLODipine (NORVASC) 10 MG tablet TAKE 1 TABLET BY MOUTH EVERY DAY 90 tablet 1  ? apixaban (ELIQUIS) 5 MG TABS tablet TAKE 1 TABLET BY MOUTH TWICE A DAY. STOP PRADAXA 60 tablet 5  ? diphenhydrAMINE-APAP, sleep, (TYLENOL  PM EXTRA STRENGTH PO) Take 1 tablet by mouth at bedtime as needed (sleep).    ? indapamide (LOZOL) 2.5 MG tablet TAKE 1 TABLET BY MOUTH EVERY DAY 90 tablet 1  ? latanoprost (XALATAN) 0.005 % ophthalmic solution Place 1 drop into both eyes at bedtime.    ? Oxycodone HCl 10 MG TABS Take one 4 times daily as needed for pain (Patient taking differently: Take one tablet 4 times daily as needed for pain) 120 tablet 0  ? pantoprazole (PROTONIX) 40 MG tablet TAKE 1 TABLET BY MOUTH DAILY BEFORE BREAKFAST 90 tablet 1  ? polyethylene glycol-electrolytes (TRILYTE) 420 g solution Take 4,000 mLs by mouth as directed. 4000 mL 0  ? pravastatin (PRAVACHOL) 80 MG tablet TAKE 1 TABLET BY MOUTH EVERYDAY AT BEDTIME 90 tablet 1  ? psyllium (METAMUCIL SMOOTH TEXTURE) 58.6 % powder Take 1 packet by mouth at bedtime.    ? ramipril (ALTACE) 10 MG capsule TAKE 1 CAPSULE BY MOUTH TWICE A DAY 180 capsule 0  ? ?No current facility-administered medications for this encounter.  ? ? ?Allergies  ?Allergen Reactions  ? Sectral [Acebutolol Hcl] Other (See Comments)  ?  Unknown reaction  ? Sulfonamide Derivatives Other (See Comments)  ?  Runs blood pressure up  ? Zocor [Simvastatin] Other (See Comments)  ?  Joint pain  ? ? ?Social History  ? ?Socioeconomic History  ? Marital status: Married  ?  Spouse name: Not on file  ? Number of children: Not on file  ? Years of education: Not on  file  ? Highest education level: Not on file  ?Occupational History  ? Occupation: retired  ?Tobacco Use  ? Smoking status: Former  ?  Packs/day: 0.25  ?  Years: 3.00  ?  Pack years: 0.75  ?  Types: Cigarettes  ?  Quit

## 2021-10-05 ENCOUNTER — Other Ambulatory Visit: Payer: Self-pay | Admitting: Family Medicine

## 2021-10-08 DIAGNOSIS — M25552 Pain in left hip: Secondary | ICD-10-CM | POA: Diagnosis not present

## 2021-10-09 ENCOUNTER — Ambulatory Visit (INDEPENDENT_AMBULATORY_CARE_PROVIDER_SITE_OTHER): Payer: PPO | Admitting: Family Medicine

## 2021-10-09 VITALS — BP 134/64 | HR 74 | Temp 98.2°F | Ht 66.0 in | Wt 176.0 lb

## 2021-10-09 DIAGNOSIS — G8929 Other chronic pain: Secondary | ICD-10-CM | POA: Diagnosis not present

## 2021-10-09 DIAGNOSIS — K219 Gastro-esophageal reflux disease without esophagitis: Secondary | ICD-10-CM

## 2021-10-09 DIAGNOSIS — E7849 Other hyperlipidemia: Secondary | ICD-10-CM | POA: Diagnosis not present

## 2021-10-09 DIAGNOSIS — I10 Essential (primary) hypertension: Secondary | ICD-10-CM | POA: Diagnosis not present

## 2021-10-09 DIAGNOSIS — M549 Dorsalgia, unspecified: Secondary | ICD-10-CM

## 2021-10-09 DIAGNOSIS — I480 Paroxysmal atrial fibrillation: Secondary | ICD-10-CM | POA: Diagnosis not present

## 2021-10-09 DIAGNOSIS — I700 Atherosclerosis of aorta: Secondary | ICD-10-CM

## 2021-10-09 MED ORDER — OXYCODONE HCL 10 MG PO TABS: 10.0000 mg | ORAL_TABLET | Freq: Four times a day (QID) | ORAL | 0 refills | Status: DC | PRN

## 2021-10-09 MED ORDER — RAMIPRIL 10 MG PO CAPS
10.0000 mg | ORAL_CAPSULE | Freq: Two times a day (BID) | ORAL | 1 refills | Status: DC
Start: 2021-10-09 — End: 2022-04-15

## 2021-10-09 MED ORDER — PANTOPRAZOLE SODIUM 40 MG PO TBEC: 40.0000 mg | DELAYED_RELEASE_TABLET | Freq: Every day | ORAL | 1 refills | Status: DC

## 2021-10-09 MED ORDER — OXYCODONE HCL 10 MG PO TABS: ORAL_TABLET | ORAL | 0 refills | Status: DC

## 2021-10-09 NOTE — Progress Notes (Signed)
Subjective:    Patient ID: [name], male    DOB: 06/30/1940, 81 y.o.   MRN: [id number]  HPI This patient was seen today for chronic pain  The medication list was reviewed and updated.  Location of Pain for which the patient has been treated with regarding narcotics: She has severe pain in his mid back and low back and also right shoulder and left hip this is been present for years he has had previous surgeries.  He has tried anti-inflammatories but can no longer take them because of blood thinners.  Surgically his surgeon told him no more surgeries.  Physical therapy is of no help to him.  He has been on pain medicine for years and is tolerated it well.  It allows him to stay functional.  Recently he has reduced his pain medicine down to 4/day tolerating this well.  Helps take the edge off his pain which allows him to stay more active  Onset of this pain: chronic , surgeries   -Compliance with medication: daily  - Number patient states they take daily: 4  -when was the last dose patient took? 7 am today   The patient was advised the importance of maintaining medication and not using illegal substances with these.  Here for refills and follow up  The patient was educated that we can provide 3 monthly scripts for their medication, it is their responsibility to follow the instructions.  Side effects or complications from medications: no  Patient is aware that pain medications are meant to minimize the severity of the pain to allow their pain levels to improve to allow for better function. They are aware of that pain medications cannot totally remove their pain.  Due for UDT ( at least once per year) : 11/2020  Scale of 1 to 10 ( 1 is least 10 is most) Your pain level without the medicine: 10 Your pain level with medication 5  Scale 1 to 10 ( 1-helps very little, 10 helps very well) How well does your pain medication reduce your pain so you can function better through out the  day? 6  Quality of the pain: sharp achy Persistence of the pain: always  Modifying factors:         Review of Systems     Objective:   Physical Exam General-in no acute distress Eyes-no discharge Lungs-respiratory rate normal, CTA CV-no murmurs,RRR Extremities skin warm dry no edema Neuro grossly normal Behavior normal, alert        Assessment & Plan:  1. Chronic back pain greater than 3 months duration The patient was seen in followup for chronic pain. A review over at their current pain status was discussed. Drug registry was checked. Prescriptions were given.  Regular follow-up recommended. Discussion was held regarding the importance of compliance with medication as well as pain medication contract.  Patient was informed that medication may cause drowsiness and should not be combined  with other medications/alcohol or street drugs. If the patient feels medication is causing altered alertness then do not drive or operate dangerous equipment.  Should be noted that the patient appears to be meeting appropriate use of opioids and response.  Evidenced by improved function and decent pain control without significant side effects and no evidence of overt aberrancy issues.  Upon discussion with the patient today they understand that opioid therapy is optional and they feel that the pain has been refractory to reasonable conservative measures and is significant and affecting quality  of life enough to warrant ongoing therapy and wishes to continue opioids.  Refills were provided.  - Basic metabolic panel - Lipid panel - Hepatic function panel  2. Gastroesophageal reflux disease without esophagitis Continue acid blocker. - pantoprazole (PROTONIX) 40 MG tablet; Take 1 tablet (40 mg total) by mouth daily before breakfast.  Dispense: 90 tablet; Refill: 1 - Basic metabolic panel - Lipid panel - Hepatic function panel  3. HYPERTENSION, BENIGN Blood pressure borderline decent  control.  Continue current measures - Basic metabolic panel - Lipid panel - Hepatic function panel  4. Paroxysmal atrial fibrillation (HCC) On blood thinner under good control currently.  5. Other hyperlipidemia Check lipid profile continue cholesterol medicine. - Basic metabolic panel - Lipid panel - Hepatic function panel Follow-up patient in 3 months Patient has aortic atherosclerosis continue cholesterol medicine

## 2021-10-10 ENCOUNTER — Other Ambulatory Visit (HOSPITAL_COMMUNITY): Payer: Self-pay | Admitting: Orthopedic Surgery

## 2021-10-10 ENCOUNTER — Other Ambulatory Visit: Payer: Self-pay | Admitting: Orthopedic Surgery

## 2021-10-10 DIAGNOSIS — M8700 Idiopathic aseptic necrosis of unspecified bone: Secondary | ICD-10-CM

## 2021-10-10 DIAGNOSIS — M25552 Pain in left hip: Secondary | ICD-10-CM

## 2021-10-27 ENCOUNTER — Telehealth (HOSPITAL_COMMUNITY): Payer: Self-pay

## 2021-10-27 DIAGNOSIS — C44319 Basal cell carcinoma of skin of other parts of face: Secondary | ICD-10-CM | POA: Diagnosis not present

## 2021-10-27 NOTE — Telephone Encounter (Signed)
Patient called in regarding his A-fib. He has been in A-fib since Thursday. This morning he went back into NSR this morning and around 9:00am-1:00pm sometime he went back into A-fib. He did take 1 tablet of his Cardizem '30mg'$  tablet. His blood pressure is 155/83, HR 79. Advised patient to only take his Cardizem '30mg'$  if his heart rate is greater than 100 and top number blood pressure needs to be above 100. Scheduled patient for appointment on 6/15 @ 3:00pm with Roderic Palau -NP. Consulted with Tilda Franco and he agreed this is okay to wait until Thursday. Patient verbalized understanding.

## 2021-10-30 ENCOUNTER — Ambulatory Visit (HOSPITAL_COMMUNITY)
Admission: RE | Admit: 2021-10-30 | Discharge: 2021-10-30 | Disposition: A | Discharge status: Home / Self Care | Payer: PPO | Source: Ambulatory Visit | Attending: Orthopedic Surgery | Admitting: Orthopedic Surgery

## 2021-10-30 ENCOUNTER — Other Ambulatory Visit: Payer: Self-pay

## 2021-10-30 ENCOUNTER — Ambulatory Visit (HOSPITAL_COMMUNITY)
Admission: RE | Admit: 2021-10-30 | Discharge: 2021-10-30 | Disposition: A | Discharge status: Home / Self Care | Payer: PPO | Source: Ambulatory Visit | Attending: Nurse Practitioner | Admitting: Nurse Practitioner

## 2021-10-30 ENCOUNTER — Ambulatory Visit (HOSPITAL_BASED_OUTPATIENT_CLINIC_OR_DEPARTMENT_OTHER)
Admission: RE | Admit: 2021-10-30 | Discharge: 2021-10-30 | Disposition: A | Discharge status: Home / Self Care | Payer: PPO | Source: Ambulatory Visit | Attending: Nurse Practitioner | Admitting: Nurse Practitioner

## 2021-10-30 VITALS — BP 168/74 | HR 76 | Ht 66.0 in | Wt 178.4 lb

## 2021-10-30 DIAGNOSIS — D6869 Other thrombophilia: Secondary | ICD-10-CM | POA: Insufficient documentation

## 2021-10-30 DIAGNOSIS — I480 Paroxysmal atrial fibrillation: Secondary | ICD-10-CM | POA: Diagnosis not present

## 2021-10-30 DIAGNOSIS — M25552 Pain in left hip: Secondary | ICD-10-CM | POA: Insufficient documentation

## 2021-10-30 DIAGNOSIS — M7062 Trochanteric bursitis, left hip: Secondary | ICD-10-CM | POA: Diagnosis not present

## 2021-10-30 DIAGNOSIS — M8700 Idiopathic aseptic necrosis of unspecified bone: Secondary | ICD-10-CM | POA: Diagnosis not present

## 2021-10-30 DIAGNOSIS — M1612 Unilateral primary osteoarthritis, left hip: Secondary | ICD-10-CM | POA: Diagnosis not present

## 2021-10-30 DIAGNOSIS — Z981 Arthrodesis status: Secondary | ICD-10-CM | POA: Diagnosis not present

## 2021-10-30 LAB — CBC
HCT: 42.2 % (ref 39.0–52.0)
Hemoglobin: 13.9 g/dL (ref 13.0–17.0)
MCH: 30.6 pg (ref 26.0–34.0)
MCHC: 32.9 g/dL (ref 30.0–36.0)
MCV: 93 fL (ref 80.0–100.0)
Platelets: 246 10*3/uL (ref 150–400)
RBC: 4.54 MIL/uL (ref 4.22–5.81)
RDW: 13.3 % (ref 11.5–15.5)
WBC: 5.3 10*3/uL (ref 4.0–10.5)
nRBC: 0 % (ref 0.0–0.2)

## 2021-10-30 LAB — BASIC METABOLIC PANEL
Anion gap: 10 (ref 5–15)
BUN: 24 mg/dL — ABNORMAL HIGH (ref 8–23)
CO2: 25 mmol/L (ref 22–32)
Calcium: 9.3 mg/dL (ref 8.9–10.3)
Chloride: 105 mmol/L (ref 98–111)
Creatinine, Ser: 0.96 mg/dL (ref 0.61–1.24)
GFR, Estimated: >60 mL/min (ref >60)
Glucose, Bld: 98 mg/dL (ref 70–99)
Potassium: 3.6 mmol/L (ref 3.5–5.1)
Sodium: 140 mmol/L (ref 135–145)

## 2021-10-30 LAB — MAGNESIUM: Magnesium: 2.1 mg/dL (ref 1.7–2.4)

## 2021-10-30 MED ORDER — METOPROLOL SUCCINATE ER 25 MG PO TB24: 25.0000 mg | ORAL_TABLET | Freq: Every day | ORAL | 3 refills | Status: DC

## 2021-10-30 NOTE — Patient Instructions (Signed)
Start metoprolol 25mg once a day 

## 2021-10-30 NOTE — Progress Notes (Signed)
Patient ID: [name], male   DOB: 04-03-1941, 81 y.o.   MRN: [id number]     Primary Care Physician: Kathyrn Drown, MD Referring Physician: [name] [name] is a 81 y.o. male with a h/o PAF, afib ablation 2011.  No afib other than around 12 hours on Christmas Day, 2017, after hearing about the death of his sister. He continues to take pradaxa on a  regular basis.  F/u 07/15/16. He reports just a few flutters now and then. He remains very active and actually had a rupture of his bicep tendon last September after digging up and pitching a rock about the size of a "coconut.". Surgery was not recommended due to his age and it has repaired to some degree with time. He continues on pradaxa with last bmet drawn 06/02/16 with creatinine of 1.10. No bleeding issues.   F/u afib clinic, 09/08/17. No further afib since December 2017. No issues with anticoagulation. Last bmet with stable creatinine with at 0.96.his only complaint is that he can not do as much work outside as he was doing last Spring." I just give out and have to stop." No chest pain or shortness of breath. Has a freind that felt the same way and he was found to need a stent. His energy improved a lot after the procedure.  Seeing pt in afib clinic 10/25/18  for c/o intermittent palpitations for the  last several weeks and fatigue. I had a virtual visit with pt 4/27 and he did not have these c/o's then. He was telling me about a fall from a ladder in the fall and broke his knee with LOC, "tore up left shoulder." He adds today that he feels that he has not been right since then. 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 I did a stress test lest year, it was read low risk but did have a few areas that ischemia could not be ruled out. He has some chest tightness with irregular heart beat. He is winded with activities. He is in SR today.   F/u in afib clinic, 09/18/20 for yearly  f/u. He reports that he has not had any afib. His  health has been ok for the last year, but his son in law died form covid last fall and his  daughter was very sick for a while as well. The daughter, who has a disability,  has moved back to his home which has placed stress on the father. He denise any chest pain or exertional dyspnea.   F/u in afib clinic. 10/01/21. No afib to report. 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  F/u in afib clinic, 10/30/21. He asked to be seen as he felt  he was going in and out of afib. He feels that he is in afib today, but his ekg shows SR with pac's. His BP is elevated today as well. He is under a lot of stress and is pending a colonoscopy next week as well as cataracts in the near future. His back/hip is very painful and he is afraid that there will be no way to treat it and he may not be able to take care of his farm. His wife/daughter  and dog all have health issues  and it is up to him to care for everyone.   Today, he denies symptoms of palpitations,+ chest pain with palpitations,+ exertional  shortness of breath, orthopnea, PND, lower extremity edema, dizziness, presyncope, syncope, or neurologic sequela. + for exertional fatigue. The patient is tolerating medications without difficulties and is otherwise without complaint today.   Past Medical History:  Diagnosis Date   Arthritis    Cancer ([place])    skin   Carpal tunnel syndrome    Cataract    left eye   Chronic back pain    multiple back surgeries   Complication of anesthesia    pt states woke up during hemorroid surgery   DVT (deep venous thrombosis) ([place]) 03/2007   left leg    Dysrhythmia    GERD (gastroesophageal reflux disease)    takes Protonix daily   Glaucoma    History of colon polyps    History of hyperglycemia    History of MRSA infection  1992   HTN (hypertension)    takes  Ramipril daily   Hyperlipidemia    takes Pravastatin 3 times a week   Nocturia    Paroxysmal atrial fibrillation (HCC)    takes Pradaxa daily, Chads2vasc score 2(07/11/14)   Pneumonia    many yrs ago   Pulmonary embolism ([place]) 2008   Past Surgical History:  Procedure Laterality Date   APPLICATION OF ROBOTIC ASSISTANCE FOR SPINAL PROCEDURE  05/06/2021   Procedure: APPLICATION OF ROBOTIC ASSISTANCE FOR SPINAL PROCEDURE;  Surgeon: Kristeen Miss, MD;  Location: [facility];  Service: Neurosurgery;;   BACK SURGERY     BIOPSY  04/01/2018   Procedure: BIOPSY;  Surgeon: Rogene Houston, MD;  [facility];  Service: Endoscopy;;  gastric   CARDIAC CATHETERIZATION  05/18/2006   CARPAL TUNNEL RELEASE Right    cataract surgery Right    cervical and lumbar surgery     COLONOSCOPY  04/24/2011   Procedure: COLONOSCOPY;  Surgeon: Rogene Houston, MD;  [facility];  Service: Endoscopy;  Laterality: N/A;  1200   COLONOSCOPY N/A 08/13/2016   Procedure: COLONOSCOPY;  Surgeon: Rogene Houston, MD;  [facility];  Service: Endoscopy;  Laterality: N/A;  1200   ESOPHAGOGASTRODUODENOSCOPY  03/10/2012   Procedure: ESOPHAGOGASTRODUODENOSCOPY (EGD);  Surgeon: Rogene Houston, MD;  [facility];  Service: Endoscopy;  Laterality: N/A;  325   ESOPHAGOGASTRODUODENOSCOPY (EGD) WITH PROPOFOL N/A 04/01/2018   Procedure: ESOPHAGOGASTRODUODENOSCOPY (EGD) WITH PROPOFOL;  Surgeon: Rogene Houston, MD;  [facility];  Service: Endoscopy;  Laterality: N/A;   EYE SURGERY     HARDWARE REMOVAL N/A 04/24/2014   Procedure: REMOVAL OF SACRAL INSTRUMENTATION WITH [place];  Surgeon: Kristeen Miss, MD;  Location: [facility];  Service: Neurosurgery;  Laterality: N/A;   HEMORRHOIDECTOMY WITH HEMORRHOID BANDING     JOINT REPLACEMENT     KNEE ARTHROSCOPY Right    KNEE ARTHROSCOPY WITH MEDIAL MENISECTOMY Right 08/03/2018   Procedure: RIGHT KNEE  ARTHROSCOPY WITH PARTIAL MEDIAL MENISCECTOMY;  Surgeon: Leandrew Koyanagi, MD;  Location: [facility];  Service: Orthopedics;  Laterality: Right;   KNEE SURGERY     left   LASIK     POLYPECTOMY  08/13/2016   Procedure: POLYPECTOMY;  Surgeon: Rogene Houston, MD;  [facility];  Service: Endoscopy;;  colon   PVI  01/24/2010   afib ablation   TRANSFORAMINAL LUMBAR INTERBODY FUSION (TLIF)  WITH PEDICLE SCREW FIXATION 1 LEVEL N/A 05/06/2021   Procedure: Transforaminal Lumbar Interbody Fusion  - Thoracic twelve-Lumbar one fixation Thoracic nine to Lumbar one with methacrylate augmentation with mazor;  Surgeon: Kristeen Miss, MD;  Location: [facility];  Service: Neurosurgery;  Laterality: N/A;    Current Outpatient Medications  Medication Sig Dispense Refill   acetaminophen (TYLENOL) 650 MG CR tablet Take 650 mg by mouth every 8 (eight) hours as needed for pain.     amLODipine (NORVASC) 10 MG tablet TAKE 1 TABLET BY MOUTH EVERY DAY 90 tablet 1   apixaban (ELIQUIS) 5 MG TABS tablet TAKE 1 TABLET BY MOUTH TWICE A DAY. STOP PRADAXA 60 tablet 5   diltiazem (CARDIZEM) 30 MG tablet Take 30 mg by mouth as needed (for A-fib episodes).     diphenhydrAMINE-APAP, sleep, (TYLENOL PM EXTRA STRENGTH PO) Take 1 tablet by mouth at bedtime as needed (sleep).     indapamide (LOZOL) 2.5 MG tablet TAKE 1 TABLET BY MOUTH EVERY DAY 90 tablet 1   latanoprost (XALATAN) 0.005 % ophthalmic solution Place 1 drop into both eyes at bedtime.     metoprolol succinate (TOPROL XL) 25 MG 24 hr tablet Take 1 tablet (25 mg total) by mouth daily. 30 tablet 3   Oxycodone HCl 10 MG TABS Take 1 tablet (10 mg total) by mouth 4 (four) times daily as needed. 120 tablet 0   pantoprazole (PROTONIX) 40 MG tablet Take 1 tablet (40 mg total) by mouth daily before breakfast. 90 tablet 1   polyethylene glycol-electrolytes (TRILYTE) 420 g solution Take 4,000 mLs by mouth as directed. 4000 mL 0   pravastatin (PRAVACHOL) 80 MG tablet  TAKE 1 TABLET BY MOUTH EVERYDAY AT BEDTIME 90 tablet 1   psyllium (METAMUCIL SMOOTH TEXTURE) 58.6 % powder Take 1 packet by mouth at bedtime.     ramipril (ALTACE) 10 MG capsule Take 1 capsule (10 mg total) by mouth 2 (two) times daily. 180 capsule 1   No current facility-administered medications for this encounter.    Allergies  Allergen Reactions   Sectral [Acebutolol Hcl] Other (See Comments)    Unknown reaction   Sulfonamide Derivatives Other (See Comments)    Runs blood pressure up   Zocor [Simvastatin] Other (See Comments)    Joint pain    Social History   Socioeconomic History   Marital status: Married    Spouse name: Not on file   Number of children: Not on file   Years of education: Not on file   Highest education level: Not on file  Occupational History   Occupation: retired  Tobacco Use   Smoking status: Former    Packs/day: 0.25    Years: 3.00    Total pack years: 0.75    Types: Cigarettes    Quit date: 03/30/1969    Years since quitting: 52.6    Passive exposure: Past   Smokeless tobacco: Never  Vaping Use   Vaping Use: Never used  Substance and Sexual Activity   Alcohol use: Yes    Alcohol/week: 14.0 standard drinks of alcohol    Types: 7 Glasses of wine, 7 Shots of liquor per week    Comment: Jack nightly   Drug use: No   Sexual activity: Yes  Other Topics Concern   Not on file  Social History Narrative   Not on file   Social Determinants of Health   Financial Resource Strain: Low Risk  (11/12/2020)   Overall Financial Resource Strain (CARDIA)  Difficulty of Paying Living Expenses: Not hard at all  Food Insecurity: No Food Insecurity (11/12/2020)   Hunger Vital Sign    Worried About Running Out of Food in the Last Year: Never true    Ran Out of Food in the Last Year: Never true  Transportation Needs: No Transportation Needs (11/12/2020)   PRAPARE - Hydrologist (Medical): No    Lack of Transportation  (Non-Medical): No  Physical Activity: Inactive (11/12/2020)   Exercise Vital Sign    Days of Exercise per Week: 0 days    Minutes of Exercise per Session: 0 min  Stress: No Stress Concern Present (11/12/2020)   [place]    Feeling of Stress : Not at all  Social Connections: Moderately Isolated (11/12/2020)   Social Connection and Isolation Panel [NHANES]    Frequency of Communication with Friends and Family: Once a week    Frequency of Social Gatherings with Friends and Family: More than three times a week    Attends Religious Services: Never    Marine scientist or Organizations: No    Attends Archivist Meetings: Never    Marital Status: Married  Human resources officer Violence: Not At Risk (11/12/2020)   Humiliation, Afraid, Rape, and Kick questionnaire    Fear of Current or Ex-Partner: No    Emotionally Abused: No    Physically Abused: No    Sexually Abused: No    Family History  Problem Relation Age of Onset   Hypertension Mother    Transient ischemic attack Mother    Heart disease Father    Diabetes Brother    Healthy Daughter    Colon cancer Sister    Hypertension Other    Heart disease Other     ROS- All systems are reviewed and negative except as per the HPI above  Physical Exam: Vitals:   10/30/21 1447  BP: (!) 168/74  Pulse: 76  Weight: 80.9 kg  Height: '5\' 6"'$  (1.676 m)    GEN- The patient is well appearing, alert and oriented x 3 today.   Head- normocephalic, atraumatic Eyes-  Sclera clear, conjunctiva pink Ears- hearing intact Oropharynx- clear Neck- supple, no JVP Lymph- no cervical lymphadenopathy Lungs- Clear to ausculation bilaterally, normal work of breathing Heart- irregular rate and rhythm, no murmurs, rubs or gallops, PMI not laterally displaced GI- soft, NT, ND, + BS Extremities- no clubbing, cyanosis, or edema MS- no significant deformity or atrophy Skin- no rash  or lesion Psych- euthymic mood, full affect Neuro- strength and sensation are intact  EKG- Vent. rate 76 BPM PR interval 184 ms QRS duration 86 ms QT/QTcB 392/441 ms P-R-T axes 100 -3 -28 Sinus rhythm with Premature atrial complexes Cannot rule out Anterior infarct , age undetermined Abnormal ECG When compared with ECG of 01-Oct-2021 11:09, PREVIOUS ECG IS PRESENT  Assessment and Plan: 1. Afib  Has been aware  of irregular heart beat recently felt he was in afib but he is symptomatic today with irregular heart beat and ekg shows SR with PAC's I will place on metoprolol succinate 25 mg daily to encourage regular rhythm  I will also place a one week ZIo patch to look for any afib as well Cbc/bmet/mag today  Has cardizem 30 mg to use as needed Continue eliquis 5 mg bid   Chadsvasc score of at least 3  2. HTN  Elevated today, better on recheck Starting metoprolol should  help  Avoid salt   He is pending an colonoscopy next week, continue BB peri procedure to encourage regular rhythm, he will need to hold DOAC for 2 days prior   Further plans on review of above tests   Butch Penny C. Ettie Krontz, [facility] [address] [place], [place] [address] [phone]

## 2021-10-31 ENCOUNTER — Encounter (HOSPITAL_COMMUNITY)
Admission: RE | Admit: 2021-10-31 | Discharge: 2021-10-31 | Disposition: A | Discharge status: Home / Self Care | Payer: PPO | Source: Ambulatory Visit | Attending: Gastroenterology | Admitting: Gastroenterology

## 2021-11-03 ENCOUNTER — Telehealth (INDEPENDENT_AMBULATORY_CARE_PROVIDER_SITE_OTHER): Payer: Self-pay | Admitting: Gastroenterology

## 2021-11-03 NOTE — Telephone Encounter (Signed)
Pt LMOM over the weekend that his heart was out of rhythm and wanted to reschedule his colonoscopy that's scheduled for 11/04/2021. [phone]

## 2021-11-04 ENCOUNTER — Encounter (HOSPITAL_COMMUNITY): Admission: RE | Payer: Self-pay | Source: Home / Self Care

## 2021-11-04 ENCOUNTER — Ambulatory Visit (HOSPITAL_COMMUNITY): Admission: RE | Admit: 2021-11-04 | Payer: PPO | Source: Home / Self Care | Admitting: Gastroenterology

## 2021-11-04 SURGERY — COLONOSCOPY WITH PROPOFOL
Anesthesia: Monitor Anesthesia Care

## 2021-11-07 DIAGNOSIS — Z6828 Body mass index (BMI) 28.0-28.9, adult: Secondary | ICD-10-CM | POA: Diagnosis not present

## 2021-11-07 DIAGNOSIS — M25552 Pain in left hip: Secondary | ICD-10-CM | POA: Diagnosis not present

## 2021-11-10 DIAGNOSIS — H16223 Keratoconjunctivitis sicca, not specified as Sjogren's, bilateral: Secondary | ICD-10-CM | POA: Diagnosis not present

## 2021-11-10 DIAGNOSIS — H401132 Primary open-angle glaucoma, bilateral, moderate stage: Secondary | ICD-10-CM | POA: Diagnosis not present

## 2021-11-10 DIAGNOSIS — Z961 Presence of intraocular lens: Secondary | ICD-10-CM | POA: Diagnosis not present

## 2021-11-10 DIAGNOSIS — H2512 Age-related nuclear cataract, left eye: Secondary | ICD-10-CM | POA: Diagnosis not present

## 2021-11-11 ENCOUNTER — Telehealth: Payer: Self-pay | Admitting: *Deleted

## 2021-11-12 NOTE — Telephone Encounter (Signed)
[name], from Dr. Ellene Route office called back and said the pt will be having a injection with [facility]. [name] asked if they need to send the clearance over or should it come from [facility]. I informed [name] that I felt the clearance should come form the office performing the procedure.   We will await for a clearance request to come over from [facility]

## 2021-11-13 DIAGNOSIS — I480 Paroxysmal atrial fibrillation: Secondary | ICD-10-CM | POA: Diagnosis not present

## 2021-11-17 ENCOUNTER — Other Ambulatory Visit (HOSPITAL_COMMUNITY): Payer: Self-pay | Admitting: Nurse Practitioner

## 2021-11-20 ENCOUNTER — Encounter (HOSPITAL_COMMUNITY): Payer: Self-pay | Admitting: *Deleted

## 2021-11-25 ENCOUNTER — Ambulatory Visit (INDEPENDENT_AMBULATORY_CARE_PROVIDER_SITE_OTHER): Payer: PPO

## 2021-11-25 VITALS — Ht 66.0 in | Wt 176.0 lb

## 2021-11-25 DIAGNOSIS — Z0000 Encounter for general adult medical examination without abnormal findings: Secondary | ICD-10-CM

## 2021-11-25 NOTE — Patient Instructions (Signed)
[name] , Thank you for taking time to come for your Medicare Wellness Visit. I appreciate your ongoing commitment to your health goals. Please review the following plan we discussed and let me know if I can assist you in the future.   Screening recommendations/referrals: Colonoscopy: Done 08/13/2016 Repeat in 5 years  Recommended yearly ophthalmology/optometry visit for glaucoma screening and checkup Recommended yearly dental visit for hygiene and checkup  Vaccinations: Influenza vaccine: Done 03/20/2021 Repeat annually  Pneumococcal vaccine: Done 03/16/2014 and 02/21/2007. Tdap vaccine: Done 01/26/2020 Repeat in 10 years  Shingles vaccine: Done 04/17/2008. Shingrix available at local pharmacy.   Covid-19: Done 07/28/2019, 06/29/2019.  Advanced directives: Advance directive discussed with you today. Even though you declined this today, please call our office should you change your mind, and we can give you the proper paperwork for you to fill out.   Conditions/risks identified: KEEP UP THE GOOD WORK!!  Next appointment: Follow up in one year for your annual wellness visit. 2024.  Preventive Care 81 Years and Older, Male  Preventive care refers to lifestyle choices and visits with your health care provider that can promote health and wellness. What does preventive care include? A yearly physical exam. This is also called an annual well check. Dental exams once or twice a year. Routine eye exams. Ask your health care provider how often you should have your eyes checked. Personal lifestyle choices, including: Daily care of your teeth and gums. Regular physical activity. Eating a healthy diet. Avoiding tobacco and drug use. Limiting alcohol use. Practicing safe sex. Taking low doses of aspirin every day. Taking vitamin and mineral supplements as recommended by your health care provider. What happens during an annual well check? The services and screenings done by your health care  provider during your annual well check will depend on your age, overall health, lifestyle risk factors, and family history of disease. Counseling  Your health care provider may ask you questions about your: Alcohol use. Tobacco use. Drug use. Emotional well-being. Home and relationship well-being. Sexual activity. Eating habits. History of falls. Memory and ability to understand (cognition). Work and work Statistician. Screening  You may have the following tests or measurements: Height, weight, and BMI. Blood pressure. Lipid and cholesterol levels. These may be checked every 5 years, or more frequently if you are over 73 years old. Skin check. Lung cancer screening. You may have this screening every year starting at age 81 if you have a 30-pack-year history of smoking and currently smoke or have quit within the past 15 years. Fecal occult blood test (FOBT) of the stool. You may have this test every year starting at age 81. Flexible sigmoidoscopy or colonoscopy. You may have a sigmoidoscopy every 5 years or a colonoscopy every 10 years starting at age 81. Prostate cancer screening. Recommendations will vary depending on your family history and other risks. Hepatitis C blood test. Hepatitis B blood test. Sexually transmitted disease (STD) testing. Diabetes screening. This is done by checking your blood sugar (glucose) after you have not eaten for a while (fasting). You may have this done every 1-3 years. Abdominal aortic aneurysm (AAA) screening. You may need this if you are a current or former smoker. Osteoporosis. You may be screened starting at age 81 if you are at high risk. Talk with your health care provider about your test results, treatment options, and if necessary, the need for more tests. Vaccines  Your health care provider may recommend certain vaccines, such as: Influenza vaccine.  This is recommended every year. Tetanus, diphtheria, and acellular pertussis (Tdap, Td)  vaccine. You may need a Td booster every 10 years. Zoster vaccine. You may need this after age 81. Pneumococcal 13-valent conjugate (PCV13) vaccine. One dose is recommended after age 81. Pneumococcal polysaccharide (PPSV23) vaccine. One dose is recommended after age 81. Talk to your health care provider about which screenings and vaccines you need and how often you need them. This information is not intended to replace advice given to you by your health care provider. Make sure you discuss any questions you have with your health care provider. Document Released: 05/31/2015 Document Revised: 01/22/2016 Document Reviewed: 03/05/2015 Elsevier Interactive Patient Education  2017 [employer] Prevention in the Home Falls can cause injuries. They can happen to people of all ages. There are many things you can do to make your home safe and to help prevent falls. What can I do on the outside of my home? Regularly fix the edges of walkways and driveways and fix any cracks. Remove anything that might make you trip as you walk through a door, such as a raised step or threshold. Trim any bushes or trees on the path to your home. Use bright outdoor lighting. Clear any walking paths of anything that might make someone trip, such as rocks or tools. Regularly check to see if handrails are loose or broken. Make sure that both sides of any steps have handrails. Any raised decks and porches should have guardrails on the edges. Have any leaves, snow, or ice cleared regularly. Use sand or salt on walking paths during winter. Clean up any spills in your garage right away. This includes oil or grease spills. What can I do in the bathroom? Use night lights. Install grab bars by the toilet and in the tub and shower. Do not use towel bars as grab bars. Use non-skid mats or decals in the tub or shower. If you need to sit down in the shower, use a plastic, non-slip stool. Keep the floor dry. Clean up any  water that spills on the floor as soon as it happens. Remove soap buildup in the tub or shower regularly. Attach bath mats securely with double-sided non-slip rug tape. Do not have throw rugs and other things on the floor that can make you trip. What can I do in the bedroom? Use night lights. Make sure that you have a light by your bed that is easy to reach. Do not use any sheets or blankets that are too big for your bed. They should not hang down onto the floor. Have a firm chair that has side arms. You can use this for support while you get dressed. Do not have throw rugs and other things on the floor that can make you trip. What can I do in the kitchen? Clean up any spills right away. Avoid walking on wet floors. Keep items that you use a lot in easy-to-reach places. If you need to reach something above you, use a strong step stool that has a grab bar. Keep electrical cords out of the way. Do not use floor polish or wax that makes floors slippery. If you must use wax, use non-skid floor wax. Do not have throw rugs and other things on the floor that can make you trip. What can I do with my stairs? Do not leave any items on the stairs. Make sure that there are handrails on both sides of the stairs and use them. Fix handrails  that are broken or loose. Make sure that handrails are as long as the stairways. Check any carpeting to make sure that it is firmly attached to the stairs. Fix any carpet that is loose or worn. Avoid having throw rugs at the top or bottom of the stairs. If you do have throw rugs, attach them to the floor with carpet tape. Make sure that you have a light switch at the top of the stairs and the bottom of the stairs. If you do not have them, ask someone to add them for you. What else can I do to help prevent falls? Wear shoes that: Do not have high heels. Have rubber bottoms. Are comfortable and fit you well. Are closed at the toe. Do not wear sandals. If you use a  stepladder: Make sure that it is fully opened. Do not climb a closed stepladder. Make sure that both sides of the stepladder are locked into place. Ask someone to hold it for you, if possible. Clearly mark and make sure that you can see: Any grab bars or handrails. First and last steps. Where the edge of each step is. Use tools that help you move around (mobility aids) if they are needed. These include: Canes. Walkers. Scooters. Crutches. Turn on the lights when you go into a dark area. Replace any light bulbs as soon as they burn out. Set up your furniture so you have a clear path. Avoid moving your furniture around. If any of your floors are uneven, fix them. If there are any pets around you, be aware of where they are. Review your medicines with your doctor. Some medicines can make you feel dizzy. This can increase your chance of falling. Ask your doctor what other things that you can do to help prevent falls. This information is not intended to replace advice given to you by your health care provider. Make sure you discuss any questions you have with your health care provider. Document Released: 02/28/2009 Document Revised: 10/10/2015 Document Reviewed: 06/08/2014 Elsevier Interactive Patient Education  2017 [employer].

## 2021-11-25 NOTE — Progress Notes (Signed)
Subjective:   [name] is a 81 y.o. male who presents for Medicare Annual/Subsequent preventive examination. Virtual Visit via Telephone Note  I connected with  [name] on 11/25/21 at 11:15 AM EDT by telephone and verified that I am speaking with the correct person using two identifiers.  Location: Patient: HOME Provider: [facility] Persons participating in the virtual visit: patient/Nurse Health Advisor   I discussed the limitations, risks, security and privacy concerns of performing an evaluation and management service by telephone and the availability of in person appointments. The patient expressed understanding and agreed to proceed.  Interactive audio and video telecommunications were attempted between this nurse and patient, however failed, due to patient having technical difficulties OR patient did not have access to video capability.  We continued and completed visit with audio only.  Some vital signs may be absent or patient reported.   [name] Driver, LPN  Review of Systems     Cardiac Risk Factors include: advanced age (>66mn, >>66women);hypertension;male gender;sedentary lifestyle;dyslipidemia     Objective:    Today's Vitals   11/25/21 1050 11/25/21 1052  Weight: 176 lb (79.8 kg)   Height: '5\' 6"'$  (1.676 m)   PainSc:  5    Body mass index is 28.41 kg/m.     11/25/2021   10:58 AM 05/20/2021    3:47 PM 05/11/2021    2:41 PM 05/06/2021   11:17 PM 04/28/2021    1:54 PM 04/02/2021    6:35 AM 11/12/2020    9:18 AM  Advanced Directives  Does Patient Have a Medical Advance Directive? No No Yes Yes Yes Yes Yes  Type of APersonnel officerof [place]Living will [place]Living will [place]Living will [place]Living will  Does patient want to make changes to medical advance directive?  No - Patient declined No - Patient declined No - Patient  declined  No - Patient declined No - Patient declined  Copy of [place]in Chart?   No - copy requested No - copy requested  No - copy requested No - copy requested  Would patient like information on creating a medical advance directive? No - Patient declined No - Patient declined         Current Medications (verified) Outpatient Encounter Medications as of 11/25/2021  Medication Sig   acetaminophen (TYLENOL) 650 MG CR tablet Take 650 mg by mouth every 8 (eight) hours as needed for pain.   amLODipine (NORVASC) 10 MG tablet TAKE 1 TABLET BY MOUTH EVERY DAY   apixaban (ELIQUIS) 5 MG TABS tablet TAKE 1 TABLET BY MOUTH TWICE A DAY. STOP PRADAXA   diltiazem (CARDIZEM) 30 MG tablet Take 30 mg by mouth as needed (for A-fib episodes).   diphenhydrAMINE-APAP, sleep, (TYLENOL PM EXTRA STRENGTH PO) Take 1 tablet by mouth at bedtime as needed (sleep).   indapamide (LOZOL) 2.5 MG tablet TAKE 1 TABLET BY MOUTH EVERY DAY   latanoprost (XALATAN) 0.005 % ophthalmic solution Place 1 drop into both eyes at bedtime.   metoprolol succinate (TOPROL-XL) 25 MG 24 hr tablet TAKE 1 TABLET (25 MG TOTAL) BY MOUTH DAILY.   Oxycodone HCl 10 MG TABS Take 1 tablet (10 mg total) by mouth 4 (four) times daily as needed.   pantoprazole (PROTONIX) 40 MG tablet Take 1 tablet (40 mg total) by mouth daily before breakfast.   polyethylene glycol-electrolytes (TRILYTE) 420 g solution  Take 4,000 mLs by mouth as directed.   pravastatin (PRAVACHOL) 80 MG tablet TAKE 1 TABLET BY MOUTH EVERYDAY AT BEDTIME   psyllium (METAMUCIL SMOOTH TEXTURE) 58.6 % powder Take 1 packet by mouth at bedtime.   ramipril (ALTACE) 10 MG capsule Take 1 capsule (10 mg total) by mouth 2 (two) times daily.   No facility-administered encounter medications on file as of 11/25/2021.    Allergies (verified) Sectral [acebutolol hcl], Sulfonamide derivatives, and Zocor [simvastatin]   History: Past Medical History:  Diagnosis Date   Arthritis     Cancer ([place])    skin   Carpal tunnel syndrome    Cataract    left eye   Chronic back pain    multiple back surgeries   Complication of anesthesia    pt states woke up during hemorroid surgery   DVT (deep venous thrombosis) ([place]) 03/2007   left leg    Dysrhythmia    GERD (gastroesophageal reflux disease)    takes Protonix daily   Glaucoma    History of colon polyps    History of hyperglycemia    History of MRSA infection 1992   HTN (hypertension)    takes  Ramipril daily   Hyperlipidemia    takes Pravastatin 3 times a week   Nocturia    Paroxysmal atrial fibrillation ([place])    takes Pradaxa daily, Chads2vasc score 2(07/11/14)   Pneumonia    many yrs ago   Pulmonary embolism ([place]) 2008   Past Surgical History:  Procedure Laterality Date   APPLICATION OF ROBOTIC ASSISTANCE FOR SPINAL PROCEDURE  05/06/2021   Procedure: APPLICATION OF ROBOTIC ASSISTANCE FOR SPINAL PROCEDURE;  Surgeon: Kristeen Miss, MD;  Location: [facility];  Service: Neurosurgery;;   BACK SURGERY     BIOPSY  04/01/2018   Procedure: BIOPSY;  Surgeon: Rogene Houston, MD;  [facility];  Service: Endoscopy;;  gastric   CARDIAC CATHETERIZATION  05/18/2006   CARPAL TUNNEL RELEASE Right    cataract surgery Right    cervical and lumbar surgery     COLONOSCOPY  04/24/2011   Procedure: COLONOSCOPY;  Surgeon: Rogene Houston, MD;  [facility];  Service: Endoscopy;  Laterality: N/A;  1200   COLONOSCOPY N/A 08/13/2016   Procedure: COLONOSCOPY;  Surgeon: Rogene Houston, MD;  [facility];  Service: Endoscopy;  Laterality: N/A;  1200   ESOPHAGOGASTRODUODENOSCOPY  03/10/2012   Procedure: ESOPHAGOGASTRODUODENOSCOPY (EGD);  Surgeon: Rogene Houston, MD;  [facility];  Service: Endoscopy;  Laterality: N/A;  325   ESOPHAGOGASTRODUODENOSCOPY (EGD) WITH PROPOFOL N/A 04/01/2018   Procedure: ESOPHAGOGASTRODUODENOSCOPY (EGD) WITH PROPOFOL;  Surgeon: Rogene Houston, MD;  [facility];  Service: Endoscopy;  Laterality: N/A;   EYE SURGERY     HARDWARE REMOVAL N/A 04/24/2014   Procedure: REMOVAL OF SACRAL INSTRUMENTATION WITH [place];  Surgeon: Kristeen Miss, MD;  Location: [facility];  Service: Neurosurgery;  Laterality: N/A;   HEMORRHOIDECTOMY WITH HEMORRHOID BANDING     JOINT REPLACEMENT     KNEE ARTHROSCOPY Right    KNEE ARTHROSCOPY WITH MEDIAL MENISECTOMY Right 08/03/2018   Procedure: RIGHT KNEE ARTHROSCOPY WITH PARTIAL MEDIAL MENISCECTOMY;  Surgeon: Leandrew Koyanagi, MD;  Location: [facility];  Service: Orthopedics;  Laterality: Right;   KNEE SURGERY     left   LASIK     POLYPECTOMY  08/13/2016   Procedure: POLYPECTOMY;  Surgeon: Rogene Houston, MD;  [facility];  Service: Endoscopy;;  colon   PVI  01/24/2010   afib ablation   TRANSFORAMINAL LUMBAR INTERBODY FUSION (TLIF) WITH PEDICLE SCREW FIXATION 1 LEVEL N/A 05/06/2021   Procedure: Transforaminal Lumbar Interbody Fusion  - Thoracic twelve-Lumbar one fixation Thoracic nine to Lumbar one with methacrylate augmentation with mazor;  Surgeon: Kristeen Miss, MD;  Location: [facility];  Service: Neurosurgery;  Laterality: N/A;   Family History  Problem Relation Age of Onset   Hypertension Mother    Transient ischemic attack Mother    Heart disease Father    Diabetes Brother    Healthy Daughter    Colon cancer Sister    Hypertension Other    Heart disease Other    Social History   Socioeconomic History   Marital status: Married    Spouse name: Not on file   Number of children: Not on file   Years of education: Not on file   Highest education level: Not on file  Occupational History   Occupation: retired  Tobacco Use   Smoking status: Former    Packs/day: 0.25    Years: 3.00    Total pack years: 0.75    Types: Cigarettes    Quit date: 03/30/1969    Years since quitting: 52.6    Passive exposure: Past   Smokeless tobacco: Never  Vaping Use   Vaping Use: Never used   Substance and Sexual Activity   Alcohol use: Yes    Alcohol/week: 14.0 standard drinks of alcohol    Types: 7 Glasses of wine, 7 Shots of liquor per week    Comment: Jack nightly   Drug use: No   Sexual activity: Yes  Other Topics Concern   Not on file  Social History Narrative   Not on file   Social Determinants of Health   Financial Resource Strain: Low Risk  (11/12/2020)   Overall Financial Resource Strain (CARDIA)    Difficulty of Paying Living Expenses: Not hard at all  Food Insecurity: No Food Insecurity (11/25/2021)   Hunger Vital Sign    Worried About Running Out of Food in the Last Year: Never true    Ran Out of Food in the Last Year: Never true  Transportation Needs: No Transportation Needs (11/25/2021)   PRAPARE - Hydrologist (Medical): No    Lack of Transportation (Non-Medical): No  Physical Activity: Sufficiently Active (11/25/2021)   Exercise Vital Sign    Days of Exercise per Week: 5 days    Minutes of Exercise per Session: 30 min  Stress: No Stress Concern Present (11/25/2021)   [place]    Feeling of Stress : Not at all  Social Connections: [place] (11/25/2021)   Social Connection and Isolation Panel [NHANES]    Frequency of Communication with Friends and Family: More than three times a week    Frequency of Social Gatherings with Friends and Family: More than three times a week    Attends Religious Services: More than 4 times per year    Active Member of [employer] or Organizations: Yes    Attends Music therapist: More than 4 times per year    Marital Status: Married    Tobacco Counseling Counseling given: Not Answered   Clinical Intake:  Pre-visit preparation completed: Yes  Pain : 0-10 Pain Score: 5  Pain Type: Acute pain Pain Location: Chest Pain Descriptors / Indicators: Aching, Discomfort Pain Onset: 1 to 4 weeks ago  Pain  Frequency: Intermittent     BMI - recorded: 28.41 Nutritional Status: BMI 25 -29 Overweight Nutritional Risks: Other (Comment) Diabetes: No  How often do you need to have someone help you when you read instructions, pamphlets, or other written materials from your doctor or pharmacy?: 1 - Never  Diabetic?NO  Interpreter Needed?: No  Information entered by :: mj Giada Schoppe, lpn   Activities of Daily Living    11/25/2021   11:00 AM 05/06/2021   11:23 PM  In your present state of health, do you have any difficulty performing the following activities:  Hearing? 0   Vision? 0   Difficulty concentrating or making decisions? 0   Walking or climbing stairs? 0   Dressing or bathing? 0   Doing errands, shopping? 0 1  Preparing Food and eating ? N   Using the Toilet? N   In the past six months, have you accidently leaked urine? Y   Do you have problems with loss of bowel control? N   Managing your Medications? N   Managing your Finances? N   Housekeeping or managing your Housekeeping? N     Patient Care Team: Kathyrn Drown, MD as PCP - General (Family Medicine)  Indicate any recent Medical Services you may have received from other than [facility] providers in the past year (date may be approximate).     Assessment:   This is a routine wellness examination for [facility].  Hearing/Vision screen Hearing Screening - Comments:: Some hearing issues.  Vision Screening - Comments:: No glasses. [employer]. 2022.  Dietary issues and exercise activities discussed: Current Exercise Habits: Home exercise routine, Type of exercise: walking, Time (Minutes): 30, Frequency (Times/Week): 5, Weekly Exercise (Minutes/Week): 150, Intensity: Mild, Exercise limited by: cardiac condition(s);orthopedic condition(s)   Goals Addressed             This Visit's Progress    Prevent falls   On track    Continue to use cane with ambulation       Depression Screen    11/25/2021   10:55 AM  03/20/2021   10:53 AM 11/12/2020    9:19 AM 06/10/2020    9:55 AM 10/13/2019   10:50 AM 05/17/2017   10:34 AM 06/07/2015   11:04 AM  PHQ 2/9 Scores  PHQ - 2 Score 0 0 0 0 2 0 0  PHQ- 9 Score     6 6     Fall Risk    11/25/2021   10:58 AM 03/20/2021   10:52 AM 11/12/2020    9:18 AM 06/10/2020    9:58 AM 08/29/2019    9:51 AM  Fall Risk   Falls in the past year? 1 1 0 0 0  Number falls in past yr: 0 0 0    Injury with Fall? 1 1 0    Risk for fall due to : History of fall(s);Impaired balance/gait;Impaired mobility History of fall(s) Impaired balance/gait Impaired balance/gait   Follow up Falls prevention discussed Falls evaluation completed  Falls evaluation completed     FALL RISK PREVENTION PERTAINING TO THE HOME:  Any stairs in or around the home? Yes  If so, are there any without handrails? No  Home free of loose throw rugs in walkways, pet beds, electrical cords, etc? Yes  Adequate lighting in your home to reduce risk of falls? Yes   ASSISTIVE DEVICES UTILIZED TO PREVENT FALLS:  Life alert? No  Use of a cane, walker or w/c? Yes  Grab bars in the bathroom? Yes  Shower chair or bench in shower? Yes  Elevated toilet seat or a handicapped toilet? Yes   TIMED UP AND GO:  Was the test performed? No .  Phone visit.  Cognitive Function:        11/25/2021   11:00 AM  6CIT Screen  What Year? 0 points  What month? 0 points  What time? 0 points  Count back from 20 0 points  Months in reverse 0 points  Repeat phrase 0 points  Total Score 0 points    Immunizations Immunization History  Administered Date(s) Administered   Fluad Quad(high Dose 65+) 03/07/2020, 03/20/2021   Influenza Split 03/22/2013   Influenza,inj,Quad PF,6+ Mos 03/16/2014, 02/19/2015, 02/13/2016, 02/16/2017, 01/31/2018, 02/18/2019   Influenza-Unspecified 01/17/2012   Moderna Sars-Covid-2 Vaccination 06/29/2019, 07/28/2019   Pneumococcal Conjugate-13 03/16/2014   Pneumococcal Polysaccharide-23  02/21/2007   Td 12/28/2003   Tdap 01/26/2020   Zoster, Live 04/17/2008    TDAP status: Up to date  Flu Vaccine status: Up to date  Pneumococcal vaccine status: Up to date  Covid-19 vaccine status: Completed vaccines  Qualifies for Shingles Vaccine? Yes   Zostavax completed Yes   Shingrix Completed?: No.    Education has been provided regarding the importance of this vaccine. Patient has been advised to call insurance company to determine out of pocket expense if they have not yet received this vaccine. Advised may also receive vaccine at local pharmacy or Health Dept. Verbalized acceptance and understanding.  Screening Tests Health Maintenance  Topic Date Due   COVID-19 Vaccine (3 - Moderna risk series) 03/17/2022 (Originally 08/25/2019)   Zoster Vaccines- Shingrix (1 of 2) 03/17/2022 (Originally 10/30/1959)   COLONOSCOPY (Pts 45-27yr Insurance coverage will need to be confirmed)  11/30/2022 (Originally 08/13/2021)   INFLUENZA VACCINE  12/16/2021   TETANUS/TDAP  01/25/2030   Pneumonia Vaccine 81 Years old  Completed   HPV VACCINES  Aged Out    Health Maintenance  There are no preventive care reminders to display for this patient.   Colorectal cancer screening: Type of screening: Colonoscopy. Completed 08/13/2016. Repeat every 5 years  Lung Cancer Screening: (Low Dose CT Chest recommended if Age 81-80years, 30 pack-year currently smoking OR have quit w/in 15years.) does not qualify.    Additional Screening:  Hepatitis C Screening: does not qualify.  Vision Screening: Recommended annual ophthalmology exams for early detection of glaucoma and other disorders of the eye. Is the patient up to date with their annual eye exam?  Yes  Who is the provider or what is the name of the office in which the patient attends annual eye exams? [employer] If pt is not established with a provider, would they like to be referred to a provider to establish care? No .   Dental  Screening: Recommended annual dental exams for proper oral hygiene  Community Resource Referral / Chronic Care Management: CRR required this visit?  No   CCM required this visit?  No      Plan:     I have personally reviewed and noted the following in the patient's chart:   Medical and social history Use of alcohol, tobacco or illicit drugs  Current medications and supplements including opioid prescriptions. Patient is currently taking opioid prescriptions. Information provided to patient regarding non-opioid alternatives. Patient advised to discuss non-opioid treatment plan with their provider. Functional ability and status Nutritional status Physical activity Advanced directives List of other physicians Hospitalizations, surgeries, and ER visits in  previous 12 months Vitals Screenings to include cognitive, depression, and falls Referrals and appointments  In addition, I have reviewed and discussed with patient certain preventive protocols, quality metrics, and best practice recommendations. A written personalized care plan for preventive services as well as general preventive health recommendations were provided to patient.     [name] Driver, LPN   [id number]   Nurse Notes: Discussed Shingrix and how to obtain. Discussed Colonoscopy, pt states he is awaiting follow up after seeing Cardiology.

## 2021-11-27 DIAGNOSIS — L570 Actinic keratosis: Secondary | ICD-10-CM | POA: Diagnosis not present

## 2021-11-27 DIAGNOSIS — X32XXXD Exposure to sunlight, subsequent encounter: Secondary | ICD-10-CM | POA: Diagnosis not present

## 2021-11-27 DIAGNOSIS — D044 Carcinoma in situ of skin of scalp and neck: Secondary | ICD-10-CM | POA: Diagnosis not present

## 2021-12-02 DIAGNOSIS — H2512 Age-related nuclear cataract, left eye: Secondary | ICD-10-CM | POA: Diagnosis not present

## 2021-12-02 DIAGNOSIS — H269 Unspecified cataract: Secondary | ICD-10-CM | POA: Diagnosis not present

## 2021-12-10 DIAGNOSIS — S46011D Strain of muscle(s) and tendon(s) of the rotator cuff of right shoulder, subsequent encounter: Secondary | ICD-10-CM | POA: Diagnosis not present

## 2021-12-12 ENCOUNTER — Telehealth: Payer: Self-pay | Admitting: *Deleted

## 2021-12-12 NOTE — Telephone Encounter (Signed)
   Patient Name: [name]  DOB: Sep 23, 1940 MRN: [id number]  Primary Cardiologist: None  Clinical pharmacist have reviewed the patient's past medical history, labs, and current medications as part of pre-operative protocol coverage.   The following recommendations have been made:  Patient with diagnosis of afib on Eliquis for anticoagulation.     Procedure: right reverse total shoulder replacement Date of procedure: TBD   CHA2DS2-VASc Score = 4  This indicates a 4.8% annual risk of stroke. The patient's score is based upon: CHF History: 0 HTN History: 1 Diabetes History: 0 Stroke History: 0 Vascular Disease History: 1 Age Score: 2 Gender Score: 0   Also with hx of DVT and PE in 2008.   CrCl 26m/min Platelet count 246K   Per office protocol, patient can hold Eliquis for 3 days prior to procedure.    I will route this recommendation to the requesting party via Epic fax function and remove from pre-op pool.  Please call with questions.  ELenna Sciara NP 12/12/2021, 4:21 PM

## 2021-12-12 NOTE — Telephone Encounter (Signed)
Patient with diagnosis of afib on Eliquis for anticoagulation.    Procedure: right reverse total shoulder replacement Date of procedure: TBD  CHA2DS2-VASc Score = 4  This indicates a 4.8% annual risk of stroke. The patient's score is based upon: CHF History: 0 HTN History: 1 Diabetes History: 0 Stroke History: 0 Vascular Disease History: 1 Age Score: 2 Gender Score: 0   Also with hx of DVT and PE in 2008.  CrCl 84m/min Platelet count 246K  Per office protocol, patient can hold Eliquis for 3 days prior to procedure.    **This guidance is not considered finalized until pre-operative APP has relayed final recommendations.**

## 2021-12-12 NOTE — Telephone Encounter (Signed)
   Pre-operative Risk Assessment    Patient Name: [name]  DOB: 06-28-1940 MRN: [id number]      Request for Surgical Clearance    Procedure:   RIGHT REVERSE TOTAL SHOULDER REPLACEMENT  Date of Surgery:  Clearance TBD                                 Surgeon:  Johnny Bridge, MD Surgeon's Group or Practice Name:  [facility] Phone number:  [phone] Fax number:  [phone]   Type of Clearance Requested:   - Pharmacy:  Hold Apixaban (Eliquis) NOT INDICATED   Type of Anesthesia:  General    Additional requests/questions:    Astrid Divine   12/12/2021, 10:11 AM

## 2021-12-15 DIAGNOSIS — G8929 Other chronic pain: Secondary | ICD-10-CM | POA: Diagnosis not present

## 2021-12-15 DIAGNOSIS — I10 Essential (primary) hypertension: Secondary | ICD-10-CM | POA: Diagnosis not present

## 2021-12-15 DIAGNOSIS — K219 Gastro-esophageal reflux disease without esophagitis: Secondary | ICD-10-CM | POA: Diagnosis not present

## 2021-12-15 DIAGNOSIS — E7849 Other hyperlipidemia: Secondary | ICD-10-CM | POA: Diagnosis not present

## 2021-12-15 DIAGNOSIS — M549 Dorsalgia, unspecified: Secondary | ICD-10-CM | POA: Diagnosis not present

## 2021-12-16 LAB — BASIC METABOLIC PANEL
BUN/Creatinine Ratio: 17 (ref 10–24)
BUN: 18 mg/dL (ref 8–27)
CO2: 23 mmol/L (ref 20–29)
Calcium: 9.4 mg/dL (ref 8.6–10.2)
Chloride: 102 mmol/L (ref 96–106)
Creatinine, Ser: 1.09 mg/dL (ref 0.76–1.27)
Glucose: 122 mg/dL — ABNORMAL HIGH (ref 70–99)
Potassium: 4.4 mmol/L (ref 3.5–5.2)
Sodium: 141 mmol/L (ref 134–144)
eGFR: 68 mL/min/{1.73_m2} (ref >59)

## 2021-12-16 LAB — HEPATIC FUNCTION PANEL
ALT: 17 IU/L (ref 0–44)
AST: 22 IU/L (ref 0–40)
Albumin: 4.5 g/dL (ref 3.7–4.7)
Alkaline Phosphatase: 92 IU/L (ref 44–121)
Bilirubin Total: 0.6 mg/dL (ref 0.0–1.2)
Bilirubin, Direct: 0.18 mg/dL (ref 0.00–0.40)
Total Protein: 6.5 g/dL (ref 6.0–8.5)

## 2021-12-16 LAB — LIPID PANEL
Chol/HDL Ratio: 3.4 ratio (ref 0.0–5.0)
Cholesterol, Total: 161 mg/dL (ref 100–199)
HDL: 48 mg/dL (ref >39)
LDL Chol Calc (NIH): 99 mg/dL (ref 0–99)
Triglycerides: 74 mg/dL (ref 0–149)
VLDL Cholesterol Cal: 14 mg/dL (ref 5–40)

## 2021-12-19 ENCOUNTER — Telehealth: Payer: Self-pay | Admitting: Family Medicine

## 2021-12-19 DIAGNOSIS — I480 Paroxysmal atrial fibrillation: Secondary | ICD-10-CM

## 2021-12-19 NOTE — Telephone Encounter (Signed)
Patient advised of provider's recommendations and stated he is fine waiting to 01/12/22 appt.   Patient states he has been having a lot of issues with heart going out of rhythm and his Cardiologist Dr Kayleen Memos stated after reviewing the Holter monitor it was fine but he doesn't think it is fine and would like to be established with a new cardiologist and would like to be with cardiologist in [place] since it is getting hard to have to travel to [place].

## 2021-12-19 NOTE — Telephone Encounter (Signed)
Nurses #1 I received communication from [employer] [facility] orthopedics regarding preoperative risk assessment for shoulder replacement 2.  We can move forward with this but typically I like to do a visit for this.  He has a visit at the end of August.  Please see with the patient would be fine that we cover all of this when he comes for his visit in August?  Also if he feels he needs to be seen sooner than the appointment would need to be moved 3.  It is also necessary for him to get clearance from his cardiologist regarding any surgery.  It does appear that [facility]on Anna has requested information from his cardiologist.  It would be wise for the patient to connect with his cardiologist to follow let him know that he is heading toward this type of surgery so if there is any additional evaluation or test it can be done ahead of time.  Please let me know what the patient decides regarding the above.  If it is fine to wait until his end of August visit then This form will be held onto until that visit

## 2021-12-20 NOTE — Telephone Encounter (Signed)
May have referral to cardiology at the hospital due to patient having difficult time getting to [facility]

## 2021-12-22 NOTE — Telephone Encounter (Signed)
Patient called and left message that he made an appointment with Dr Kayleen Memos in [place] for 12/24/21 because his heart continues to flip out of rhythm and he needs to have something done

## 2021-12-22 NOTE — Telephone Encounter (Signed)
So noted-I believe this is a good idea.  Also they can give input regarding his cardiac status for shoulder surgery

## 2021-12-22 NOTE — Telephone Encounter (Signed)
Referral placed. Left detailed message on cell phone

## 2021-12-23 ENCOUNTER — Encounter: Payer: Self-pay | Admitting: Cardiology

## 2021-12-23 NOTE — Telephone Encounter (Signed)
error 

## 2021-12-24 ENCOUNTER — Ambulatory Visit (HOSPITAL_COMMUNITY)
Admission: RE | Admit: 2021-12-24 | Discharge: 2021-12-24 | Disposition: A | Discharge status: Home / Self Care | Payer: PPO | Source: Ambulatory Visit | Attending: Nurse Practitioner | Admitting: Nurse Practitioner

## 2021-12-24 ENCOUNTER — Encounter (HOSPITAL_COMMUNITY): Payer: Self-pay | Admitting: Nurse Practitioner

## 2021-12-24 ENCOUNTER — Telehealth: Payer: Self-pay | Admitting: Cardiology

## 2021-12-24 VITALS — BP 146/66 | HR 61 | Ht 66.0 in | Wt 184.0 lb

## 2021-12-24 DIAGNOSIS — I4891 Unspecified atrial fibrillation: Secondary | ICD-10-CM | POA: Insufficient documentation

## 2021-12-24 DIAGNOSIS — Z79899 Other long term (current) drug therapy: Secondary | ICD-10-CM | POA: Insufficient documentation

## 2021-12-24 DIAGNOSIS — I480 Paroxysmal atrial fibrillation: Secondary | ICD-10-CM | POA: Diagnosis not present

## 2021-12-24 DIAGNOSIS — I10 Essential (primary) hypertension: Secondary | ICD-10-CM | POA: Diagnosis not present

## 2021-12-24 DIAGNOSIS — D6869 Other thrombophilia: Secondary | ICD-10-CM

## 2021-12-24 DIAGNOSIS — Z7901 Long term (current) use of anticoagulants: Secondary | ICD-10-CM | POA: Diagnosis not present

## 2021-12-24 MED ORDER — METOPROLOL SUCCINATE ER 25 MG PO TB24: 25.0000 mg | ORAL_TABLET | Freq: Two times a day (BID) | ORAL | 2 refills | Status: DC

## 2021-12-24 NOTE — Telephone Encounter (Signed)
See previous notes.   I will send a message to [place] office to reach out to the pt with an appt.   Per pre op provider Dr. Marlou Porch if he goes to [place].

## 2021-12-24 NOTE — Telephone Encounter (Signed)
Received this back from [facility] that patient still needs cardiac clearance and wants to see provider in [place] due to transportation issues.

## 2021-12-24 NOTE — Patient Instructions (Signed)
Increase metoprolol to 25mg twice a day
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
## 2021-12-24 NOTE — Telephone Encounter (Signed)
  Patient would like to switch from Dr Percival Spanish to Dr Marlou Porch so that he can be seen at the [place] location where he lives.

## 2021-12-25 NOTE — Telephone Encounter (Signed)
Message sent to [facility] scheduling to see if they may be able to get the pt an appt. See previous notes

## 2021-12-26 NOTE — Telephone Encounter (Signed)
Pt has appt 01/26/22 in [place] with Ermalinda Barrios, PAC, see previous notes .

## 2021-12-26 NOTE — Telephone Encounter (Signed)
Message sent to [facility] sched
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
## 2022-01-05 DIAGNOSIS — C44319 Basal cell carcinoma of skin of other parts of face: Secondary | ICD-10-CM | POA: Diagnosis not present

## 2022-01-12 ENCOUNTER — Ambulatory Visit (INDEPENDENT_AMBULATORY_CARE_PROVIDER_SITE_OTHER): Payer: PPO | Admitting: Family Medicine

## 2022-01-12 ENCOUNTER — Encounter: Payer: Self-pay | Admitting: Family Medicine

## 2022-01-12 ENCOUNTER — Other Ambulatory Visit: Payer: Self-pay | Admitting: Family Medicine

## 2022-01-12 VITALS — BP 134/74 | Wt 187.4 lb

## 2022-01-12 DIAGNOSIS — R0609 Other forms of dyspnea: Secondary | ICD-10-CM | POA: Diagnosis not present

## 2022-01-12 DIAGNOSIS — R0600 Dyspnea, unspecified: Secondary | ICD-10-CM | POA: Diagnosis not present

## 2022-01-12 DIAGNOSIS — Z79891 Long term (current) use of opiate analgesic: Secondary | ICD-10-CM | POA: Diagnosis not present

## 2022-01-12 MED ORDER — OXYCODONE HCL 10 MG PO TABS: ORAL_TABLET | ORAL | 0 refills | Status: DC

## 2022-01-12 MED ORDER — OXYCODONE HCL 10 MG PO TABS: 10.0000 mg | ORAL_TABLET | Freq: Four times a day (QID) | ORAL | 0 refills | Status: DC | PRN

## 2022-01-12 NOTE — Progress Notes (Signed)
Subjective:    Patient ID: [name], male    DOB: 03-[name]-1940, 81 y.o.   MRN: [id number]  HPI This patient was seen today for chronic pain  The medication list was reviewed and updated.  Location of Pain for which the patient has been treated with regarding narcotics:   Onset of this pain:    -Compliance with medication: Oxycodone 10 mg   - Number patient states they take daily: 2-4; sometimes 5   -when was the last dose patient took? This morning  The patient was advised the importance of maintaining medication and not using illegal substances with these.  Here for refills and follow up  The patient was educated that we can provide 3 monthly scripts for their medication, it is their responsibility to follow the instructions.  Side effects or complications from medications: constipation but is taking Metamucil nightly  Patient is aware that pain medications are meant to minimize the severity of the pain to allow their pain levels to improve to allow for better function. They are aware of that pain medications cannot totally remove their pain.  Due for UDT ( at least once per year) : completed today   Scale of 1 to 10 ( 1 is least 10 is most) Your pain level without the medicine: 10 Your pain level with medication: 3-4  Scale 1 to 10 ( 1-helps very little, 10 helps very well) How well does your pain medication reduce your pain so you can function better through out the day? 7  Quality of the pain:   Persistence of the pain:   Modifying factors:   Pt states his heart is out of rhythm; pt is able to feel when this happens He is very concerned about his heart he states at times it seems to be running fast other times it seems to be better he will be seeing a new cardiologist toward the end of September  He does relate getting out of breath with activity states he is get short winded.  Denies chest pressure or tightness he is worried about a heart blockage.  He also  states at times he gets short of breath when he lays back in his bed  He relates he is facing the possibility of a right shoulder surgery but states currently does not want to do surgery because of his concern for ongoing issues with anesthesia Review of Systems     Objective:   Physical Exam General-in no acute distress Eyes-no discharge Lungs-respiratory rate normal, CTA CV-no murmurs,RRR Extremities skin warm dry no edema Neuro grossly normal Behavior normal, alert No significant edema in the legs       Assessment & Plan:  1. Encounter for long-term use of opiate analgesic The patient was seen in followup for chronic pain. A review over at their current pain status was discussed. Drug registry was checked. Prescriptions were given.  Regular follow-up recommended. Discussion was held regarding the importance of compliance with medication as well as pain medication contract.  Patient was informed that medication may cause drowsiness and should not be combined  with other medications/alcohol or street drugs. If the patient feels medication is causing altered alertness then do not drive or operate dangerous equipment.  Should be noted that the patient appears to be meeting appropriate use of opioids and response.  Evidenced by improved function and decent pain control without significant side effects and no evidence of overt aberrancy issues.  Upon discussion with the patient today  they understand that opioid therapy is optional and they feel that the pain has been refractory to reasonable conservative measures and is significant and affecting quality of life enough to warrant ongoing therapy and wishes to continue opioids.  Refills were provided. Patient is discouraged that he is on pain medicine but at the same time recognizes that he needs it.  Denies abusing it.  Takes anywhere between 2 and 4/day.  We will send in 3 scripts today.  As always patient was given option to see pain  management if he would like he does not want to do so currently - ToxASSURE Select 13 (MW), Urine  2. PND (paroxysmal nocturnal dyspnea) We will go ahead with echo to evaluate for the possibility of reduced ejection fraction causing his problems it is wise for him to still see cardiology for further evaluation whether or not they do CTA or stress test will be up to them  3. DOE (dyspnea on exertion) Pulmonary function test with his health history, echo also ordered, may need further cardiology testing per cardiology when they see him - Pulmonary function test  Hold off on shoulder surgery currently see above

## 2022-01-15 LAB — TOXASSURE SELECT 13 (MW), URINE

## 2022-01-20 NOTE — Progress Notes (Signed)
Cardiology Office Note:    Date:  01/26/2022   ID:  [name], DOB 1940/06/22, MRN [id number]  PCP:  Kathyrn Drown, MD  [facility] Providers Cardiologist:  None     Referring MD: Kathyrn Drown, MD   Chief Complaint:  No chief complaint on file.     History of Present Illness:   [name] is a 81 y.o. male  with a h/o PAF, afib ablation 2011 on eliquis. F/u in Afib clinic 12/24/21 was still having irreg HB but was in NSR and Zio 10/2021 PVC's. Under a lot of stress. Metoprolol increased 25 mg bid.  Patient on my schedule for Preop clearance for right reverse total shoulder replacement by Dr. Marchia Bond and will need to hold eliquis-cleared by pharmacy team to hold for 3 days.   Patient says he probably won't have surgery until absolutely necessary. He awakened with shortness of breath last night and it helped to sleep in a recliner. He says his heart skip during the day and gets short of breath. He can't walk 150 feet without having to stop and sit down. Dr. Wolfgang Phoenix ordered an echo but not yet scheduled. Denies chest tightness or pain.          Past Medical History:  Diagnosis Date   Arthritis    Cancer ([place])    skin   Carpal tunnel syndrome    Cataract    left eye   Chronic back pain    multiple back surgeries   Complication of anesthesia    pt states woke up during hemorroid surgery   DVT (deep venous thrombosis) ([place]) 03/2007   left leg    Dysrhythmia    GERD (gastroesophageal reflux disease)    takes Protonix daily   Glaucoma    History of colon polyps    History of hyperglycemia    History of MRSA infection 1992   HTN (hypertension)    takes  Ramipril daily   Hyperlipidemia    takes Pravastatin 3 times a week   Nocturia    Paroxysmal atrial fibrillation (HCC)    takes Pradaxa daily, Chads2vasc score 2(07/11/14)   Pneumonia    many yrs ago   Pulmonary embolism ([place]) 2008   Current Medications: Current Meds  Medication Sig    acetaminophen (TYLENOL) 650 MG CR tablet Take 650 mg by mouth every 8 (eight) hours as needed for pain.   amLODipine (NORVASC) 10 MG tablet TAKE 1 TABLET BY MOUTH EVERY DAY   apixaban (ELIQUIS) 5 MG TABS tablet TAKE 1 TABLET BY MOUTH TWICE A DAY. STOP PRADAXA   diphenhydrAMINE-APAP, sleep, (TYLENOL PM EXTRA STRENGTH PO) Take 1 tablet by mouth at bedtime as needed (sleep).   gatifloxacin (ZYMAXID) 0.5 % SOLN Place 1 drop into the left eye 3 (three) times daily.   indapamide (LOZOL) 2.5 MG tablet TAKE 1 TABLET BY MOUTH EVERY DAY   ketorolac (ACULAR) 0.5 % ophthalmic solution Place 1 drop into the left eye 2 (two) times daily.   latanoprost (XALATAN) 0.005 % ophthalmic solution Place 1 drop into both eyes at bedtime.   metoprolol succinate (TOPROL-XL) 25 MG 24 hr tablet Take 1 tablet (25 mg total) by mouth 2 (two) times daily.   Oxycodone HCl 10 MG TABS Take 1 tablet (10 mg total) by mouth 4 (four) times daily as needed.   Oxycodone HCl 10 MG TABS Take 1 tablet (10 mg total) by mouth 4 (four) times daily as needed.  Oxycodone HCl 10 MG TABS Take 1 tablet (10 mg total) by mouth 4 (four) times daily as needed.   pantoprazole (PROTONIX) 40 MG tablet Take 1 tablet (40 mg total) by mouth daily before breakfast.   polyethylene glycol-electrolytes (TRILYTE) 420 g solution Take 4,000 mLs by mouth as directed.   pravastatin (PRAVACHOL) 80 MG tablet TAKE 1 TABLET BY MOUTH EVERYDAY AT BEDTIME   psyllium (METAMUCIL SMOOTH TEXTURE) 58.6 % powder Take 1 packet by mouth at bedtime.   ramipril (ALTACE) 10 MG capsule Take 1 capsule (10 mg total) by mouth 2 (two) times daily.    Allergies:   Sectral [acebutolol hcl], Sulfonamide derivatives, and Zocor [simvastatin]   Social History   Tobacco Use   Smoking status: Former    Packs/day: 0.25    Years: 3.00    Total pack years: 0.75    Types: Cigarettes    Quit date: 03/30/1969    Years since quitting: 52.8    Passive exposure: Past   Smokeless tobacco: Never   Vaping Use   Vaping Use: Never used  Substance Use Topics   Alcohol use: Yes    Alcohol/week: 14.0 standard drinks of alcohol    Types: 7 Glasses of wine, 7 Shots of liquor per week    Comment: Jack nightly   Drug use: No    Family Hx: The patient's family history includes Colon cancer in his sister; Diabetes in his brother; Healthy in his daughter; Heart disease in his father and another family member; Hypertension in his mother and another family member; Transient ischemic attack in his mother.  ROS   EKGs/Labs/Other Test Reviewed:    EKG:  EKG is  not ordered today.   Recent Labs: 10/30/2021: Hemoglobin 13.9; Magnesium 2.1; Platelets 246 12/15/2021: ALT 17; BUN 18; Creatinine, Ser 1.09; Potassium 4.4; Sodium 141   Recent Lipid Panel Recent Labs    12/15/21 0829  CHOL 161  TRIG 74  HDL 48  LDLCALC 99     Prior CV Studies: No results found for this or any previous visit from the past 3650 days.   LONG TERM MONITOR(3-7 DAYS) HOOK UP AND INTERPRETATION 11/13/2021  Narrative Patch Wear Time:  7 days and 0 hours  Dominant rhythm was sinus rhythm 3.8% supraventricular ectopy Less than 1% ventricular ectopy Triggered episodes associated with sinus rhythm, occasionally sinus rhythm with supraventricular ectopy  Will Camnitz, MD   No results found for this or any previous visit from the past 3650 days.         Risk Assessment/Calculations/Metrics:    CHA2DS2-VASc Score = 4   This indicates a 4.8% annual risk of stroke. The patient's score is based upon: CHF History: 0 HTN History: 1 Diabetes History: 0 Stroke History: 0 Vascular Disease History: 1 Age Score: 2 Gender Score: 0           Physical Exam:    VS:  BP (!) 168/76   Pulse 64   Ht '5\' 6"'$  (1.676 m)   Wt 187 lb (84.8 kg)   SpO2 95%   BMI 30.18 kg/m     Wt Readings from Last 3 Encounters:  01/26/22 187 lb (84.8 kg)  01/12/22 187 lb 6.4 oz (85 kg)  12/24/21 184 lb (83.5 kg)    Physical  Exam  GEN: Obese, in no acute distress  Neck: no JVD, carotid bruits, or masses Cardiac:RRR; no murmurs, rubs, or gallops  Respiratory:  clear to auscultation bilaterally, normal work of breathing GI: soft,  nontender, nondistended, + BS Ext: without cyanosis, clubbing, or edema, Good distal pulses bilaterally Neuro:  Alert and Oriented x 3, Psych: euthymic mood, full affect       ASSESSMENT & PLAN:   No problem-specific Assessment & Plan notes found for this encounter.    Preop clearance for right reverse total shoulder replacement by Dr. Marchia Bond and will need to hold eliquis-cleared by pharmacy team to hold for 3 days. Patient with worsening DOE and doesn't plan to have surgery anytime soon so will hold off on clearance  DOE-can't walk 150 ft and has to stop. Echo ordered by Dr. Wolfgang Phoenix but not scheduled yet. Will have him schedule today in addition to NST.   Afib on Pradaxa, metoprolol and prn diltiazem followed by Afib clinic but wants to be followed here now. He still has palpations almost daily but recent ZIo showed NSR with some SVT and atrial tachycardia. Metoprolol increased 25 mg bid  HTN BP up today. Will hold off on changing meds until echo and stress test back.  HLD on pravastatin managed by PCP        Shared Decision Making/Informed Consent The risks [chest pain, shortness of breath, cardiac arrhythmias, dizziness, blood pressure fluctuations, myocardial infarction, stroke/transient ischemic attack, nausea, vomiting, allergic reaction, radiation exposure, metallic taste sensation and life-threatening complications (estimated to be 1 in 10,000)], benefits (risk stratification, diagnosing coronary artery disease, treatment guidance) and alternatives of a nuclear stress test were discussed in detail with Mr. Rog and he agrees to proceed.   Dispo:  No follow-ups on file.   Medication Adjustments/Labs and Tests Ordered: Current medicines are reviewed at length with  the patient today.  Concerns regarding medicines are outlined above.  Tests Ordered: Orders Placed This Encounter  Procedures   NM Myocar Multi W/Spect W/Wall Motion / EF   ECHOCARDIOGRAM COMPLETE   Medication Changes: No orders of the defined types were placed in this encounter.  Signed, Ermalinda Barrios, PA-C  01/26/2022 1:24 PM    [address], [place], [place]  [address] Phone: [phone]; Fax: [phone]

## 2022-01-26 ENCOUNTER — Ambulatory Visit: Payer: PPO | Attending: Physician Assistant | Admitting: Physician Assistant

## 2022-01-26 ENCOUNTER — Encounter: Payer: Self-pay | Admitting: Physician Assistant

## 2022-01-26 VITALS — BP 168/76 | HR 64 | Ht 66.0 in | Wt 187.0 lb

## 2022-01-26 DIAGNOSIS — E785 Hyperlipidemia, unspecified: Secondary | ICD-10-CM | POA: Diagnosis not present

## 2022-01-26 DIAGNOSIS — I480 Paroxysmal atrial fibrillation: Secondary | ICD-10-CM

## 2022-01-26 DIAGNOSIS — I10 Essential (primary) hypertension: Secondary | ICD-10-CM

## 2022-01-26 DIAGNOSIS — R0609 Other forms of dyspnea: Secondary | ICD-10-CM | POA: Diagnosis not present

## 2022-01-26 NOTE — Patient Instructions (Signed)
Medication Instructions:  Your physician recommends that you continue on your current medications as directed. Please refer to the Current Medication list given to you today.   Labwork: None today  Testing/Procedures: Your physician has requested that you have a lexiscan myoview. For further information please visit [email or web address]. Please follow instruction sheet, as given.   Schedule echocardiogram  Follow-Up: M.lenze, PA-C after testing  Any Other Special Instructions Will Be Listed Below (If Applicable).  If you need a refill on your cardiac medications before your next appointment, please call your pharmacy.

## 2022-01-29 DIAGNOSIS — Z85828 Personal history of other malignant neoplasm of skin: Secondary | ICD-10-CM | POA: Diagnosis not present

## 2022-01-29 DIAGNOSIS — X32XXXD Exposure to sunlight, subsequent encounter: Secondary | ICD-10-CM | POA: Diagnosis not present

## 2022-01-29 DIAGNOSIS — Z08 Encounter for follow-up examination after completed treatment for malignant neoplasm: Secondary | ICD-10-CM | POA: Diagnosis not present

## 2022-01-29 DIAGNOSIS — L570 Actinic keratosis: Secondary | ICD-10-CM | POA: Diagnosis not present

## 2022-02-06 ENCOUNTER — Ambulatory Visit (HOSPITAL_BASED_OUTPATIENT_CLINIC_OR_DEPARTMENT_OTHER)
Admission: RE | Admit: 2022-02-06 | Discharge: 2022-02-06 | Disposition: A | Discharge status: Home / Self Care | Payer: PPO | Source: Ambulatory Visit | Attending: Physician Assistant | Admitting: Physician Assistant

## 2022-02-06 ENCOUNTER — Ambulatory Visit (HOSPITAL_COMMUNITY)
Admission: RE | Admit: 2022-02-06 | Discharge: 2022-02-06 | Disposition: A | Discharge status: Home / Self Care | Payer: PPO | Source: Ambulatory Visit | Attending: Physician Assistant | Admitting: Physician Assistant

## 2022-02-06 DIAGNOSIS — R0609 Other forms of dyspnea: Secondary | ICD-10-CM

## 2022-02-06 LAB — NM MYOCAR MULTI W/SPECT W/WALL MOTION / EF
LV dias vol: 138 mL (ref 62–150)
LV sys vol: 59 mL
Nuc Stress EF: 57 %
Peak HR: 75 {beats}/min
RATE: 0.4
Rest HR: 53 {beats}/min
Rest Nuclear Isotope Dose: 11 mCi
SDS: 1
SRS: 4
SSS: 5
Stress Nuclear Isotope Dose: 31.7 mCi
TID: 0.99

## 2022-02-06 LAB — ECHOCARDIOGRAM COMPLETE
AR max vel: 1.9 cm2
AV Area VTI: 1.94 cm2
AV Area mean vel: 2.02 cm2
AV Mean grad: 8 mmHg
AV Peak grad: 15.8 mmHg
Ao pk vel: 1.99 m/s
Area-P 1/2: 3.77 cm2
MV M vel: 5.5 m/s
MV Peak grad: 121 mmHg
Radius: 0.4 cm
S' Lateral: 3.4 cm

## 2022-02-06 MED ORDER — TECHNETIUM TC 99M TETROFOSMIN IV KIT
30.0000 | PACK | Freq: Once | INTRAVENOUS | Status: AC | PRN
  Administered 2022-02-06: 31.7 via INTRAVENOUS

## 2022-02-06 MED ORDER — REGADENOSON 0.4 MG/5ML IV SOLN
INTRAVENOUS | Status: AC
  Administered 2022-02-06: 0.4 mg via INTRAVENOUS
  Filled 2022-02-06: qty 5

## 2022-02-06 MED ORDER — TECHNETIUM TC 99M TETROFOSMIN IV KIT
10.0000 | PACK | Freq: Once | INTRAVENOUS | Status: AC | PRN
  Administered 2022-02-06: 11 via INTRAVENOUS

## 2022-02-06 MED ORDER — SODIUM CHLORIDE FLUSH 0.9 % IV SOLN
INTRAVENOUS | Status: AC
  Administered 2022-02-06: 10 mL via INTRAVENOUS
  Filled 2022-02-06: qty 10

## 2022-02-06 NOTE — Progress Notes (Signed)
*  PRELIMINARY RESULTS* Echocardiogram 2D Echocardiogram has been performed.  [name] 02/06/2022, 9:13 AM

## 2022-02-11 ENCOUNTER — Ambulatory Visit: Payer: PPO | Attending: Cardiology | Admitting: Cardiology

## 2022-02-11 ENCOUNTER — Encounter: Payer: Self-pay | Admitting: Cardiology

## 2022-02-11 VITALS — BP 142/74 | HR 72 | Ht 66.0 in | Wt 191.8 lb

## 2022-02-11 DIAGNOSIS — R0609 Other forms of dyspnea: Secondary | ICD-10-CM | POA: Diagnosis not present

## 2022-02-11 DIAGNOSIS — I700 Atherosclerosis of aorta: Secondary | ICD-10-CM

## 2022-02-11 DIAGNOSIS — I10 Essential (primary) hypertension: Secondary | ICD-10-CM

## 2022-02-11 DIAGNOSIS — E7849 Other hyperlipidemia: Secondary | ICD-10-CM | POA: Diagnosis not present

## 2022-02-11 DIAGNOSIS — I480 Paroxysmal atrial fibrillation: Secondary | ICD-10-CM | POA: Diagnosis not present

## 2022-02-11 NOTE — Patient Instructions (Signed)
Medication Instructions:  Your physician recommends that you continue on your current medications as directed. Please refer to the Current Medication list given to you today.   Labwork: None today  Testing/Procedures: None today  Follow-Up: 6 months  Any Other Special Instructions Will Be Listed Below (If Applicable).  If you need a refill on your cardiac medications before your next appointment, please call your pharmacy.  

## 2022-02-11 NOTE — Progress Notes (Signed)
Cardiology Office Note:    Date:  02/11/2022   ID:  [name], DOB Sep 20, 1940, MRN [id number]  PCP:  [name] Drown, MD   [facility] Providers Cardiologist:  None     Referring MD: [name] Drown, MD    History of Present Illness:    [name] is a 81 y.o. male here for the follow-up of paroxysmal atrial fibrillation status post ablation in 2011 currently on chronic anticoagulation with Eliquis.  Has been to the atrial fibrillation clinic in August with irregular heartbeats but was actually in normal sinus rhythm.  A prior ZIO monitor in June 2023 showed PVCs.  This is while he was under increased stress.  Metoprolol was increased to 25 mg twice a day.  Dr. Mardelle Matte had requested preoperative clearance for right reverse total shoulder replacement.  He feels as though he will not probably have surgery until absolutely necessary.  He is) with Dr. Kristeen Miss neurosurgeon.  He has operated on he, his wife [name] as well as his daughter.  They are both pilots.  Sometimes he has been experiencing shortness of breath with minimal exertion.  Have to sleep in a recliner at times.  No significant chest pain.  Sometimes it comes out of nowhere during the day.  He states that while he was getting his nuclear injection he felt the same feeling and one of the nurses told him to take in deep breaths through his nose and out through his mouth.  He did this and he states that this made him feel better.  Perhaps anxiety is playing a role.  Smoked only for a very short time in the late 1960s.  He has had a prior history of DVT/PE.  He went to [place] to get 1 time of [place] feed when he was having chest discomfort unloaded 80 bags went inside to lay down and [name] his wife stated that he had never taken a nap in his day.  Went to the hospital and said that he had a blood clot in his lungs and sure enough he was right.  Past Medical History:  Diagnosis Date   Arthritis    Cancer ([place])     skin   Carpal tunnel syndrome    Cataract    left eye   Chronic back pain    multiple back surgeries   Complication of anesthesia    pt states woke up during hemorroid surgery   DVT (deep venous thrombosis) ([place]) 03/2007   left leg    Dysrhythmia    GERD (gastroesophageal reflux disease)    takes Protonix daily   Glaucoma    History of colon polyps    History of hyperglycemia    History of MRSA infection 1992   HTN (hypertension)    takes  Ramipril daily   Hyperlipidemia    takes Pravastatin 3 times a week   Nocturia    Paroxysmal atrial fibrillation (HCC)    takes Pradaxa daily, Chads2vasc score 2(07/11/14)   Pneumonia    many yrs ago   Pulmonary embolism ([place]) 2008    Past Surgical History:  Procedure Laterality Date   APPLICATION OF ROBOTIC ASSISTANCE FOR SPINAL PROCEDURE  05/06/2021   Procedure: APPLICATION OF ROBOTIC ASSISTANCE FOR SPINAL PROCEDURE;  Surgeon: Kristeen Miss, MD;  Location: [facility];  Service: Neurosurgery;;   BACK SURGERY     BIOPSY  04/01/2018   Procedure: BIOPSY;  Surgeon: Rogene Houston, MD;  [facility];  Service: Endoscopy;;  gastric   CARDIAC CATHETERIZATION  05/18/2006   CARPAL TUNNEL RELEASE Right    cataract surgery Right    cervical and lumbar surgery     COLONOSCOPY  04/24/2011   Procedure: COLONOSCOPY;  Surgeon: Rogene Houston, MD;  [facility];  Service: Endoscopy;  Laterality: N/A;  1200   COLONOSCOPY N/A 08/13/2016   Procedure: COLONOSCOPY;  Surgeon: Rogene Houston, MD;  [facility];  Service: Endoscopy;  Laterality: N/A;  1200   ESOPHAGOGASTRODUODENOSCOPY  03/10/2012   Procedure: ESOPHAGOGASTRODUODENOSCOPY (EGD);  Surgeon: Rogene Houston, MD;  [facility];  Service: Endoscopy;  Laterality: N/A;  325   ESOPHAGOGASTRODUODENOSCOPY (EGD) WITH PROPOFOL N/A 04/01/2018   Procedure: ESOPHAGOGASTRODUODENOSCOPY (EGD) WITH PROPOFOL;  Surgeon: Rogene Houston, MD;  [facility];   Service: Endoscopy;  Laterality: N/A;   EYE SURGERY     HARDWARE REMOVAL N/A 04/24/2014   Procedure: REMOVAL OF SACRAL INSTRUMENTATION WITH [place];  Surgeon: Kristeen Miss, MD;  Location: [facility];  Service: Neurosurgery;  Laterality: N/A;   HEMORRHOIDECTOMY WITH HEMORRHOID BANDING     JOINT REPLACEMENT     KNEE ARTHROSCOPY Right    KNEE ARTHROSCOPY WITH MEDIAL MENISECTOMY Right 08/03/2018   Procedure: RIGHT KNEE ARTHROSCOPY WITH PARTIAL MEDIAL MENISCECTOMY;  Surgeon: Leandrew Koyanagi, MD;  Location: [facility];  Service: Orthopedics;  Laterality: Right;   KNEE SURGERY     left   LASIK     POLYPECTOMY  08/13/2016   Procedure: POLYPECTOMY;  Surgeon: Rogene Houston, MD;  [facility];  Service: Endoscopy;;  colon   PVI  01/24/2010   afib ablation   TRANSFORAMINAL LUMBAR INTERBODY FUSION (TLIF) WITH PEDICLE SCREW FIXATION 1 LEVEL N/A 05/06/2021   Procedure: Transforaminal Lumbar Interbody Fusion  - Thoracic twelve-Lumbar one fixation Thoracic nine to Lumbar one with methacrylate augmentation with mazor;  Surgeon: Kristeen Miss, MD;  Location: [facility];  Service: Neurosurgery;  Laterality: N/A;    Current Medications: Current Meds  Medication Sig   acetaminophen (TYLENOL) 650 MG CR tablet Take 650 mg by mouth every 8 (eight) hours as needed for pain.   amLODipine (NORVASC) 10 MG tablet TAKE 1 TABLET BY MOUTH EVERY DAY   apixaban (ELIQUIS) 5 MG TABS tablet TAKE 1 TABLET BY MOUTH TWICE A DAY. STOP PRADAXA   diltiazem (CARDIZEM) 30 MG tablet Take 30 mg by mouth as needed (for A-fib episodes).   diphenhydrAMINE-APAP, sleep, (TYLENOL PM EXTRA STRENGTH PO) Take 1 tablet by mouth at bedtime as needed (sleep).   gatifloxacin (ZYMAXID) 0.5 % SOLN Place 1 drop into the left eye 3 (three) times daily.   indapamide (LOZOL) 2.5 MG tablet TAKE 1 TABLET BY MOUTH EVERY DAY   ketorolac (ACULAR) 0.5 % ophthalmic solution Place 1 drop into the left eye 2 (two) times daily.    latanoprost (XALATAN) 0.005 % ophthalmic solution Place 1 drop into both eyes at bedtime.   metoprolol succinate (TOPROL-XL) 25 MG 24 hr tablet Take 1 tablet (25 mg total) by mouth 2 (two) times daily.   Oxycodone HCl 10 MG TABS Take 1 tablet (10 mg total) by mouth 4 (four) times daily as needed.   Oxycodone HCl 10 MG TABS Take 1 tablet (10 mg total) by mouth 4 (four) times daily as needed.   Oxycodone HCl 10 MG TABS Take 1 tablet (10 mg total) by mouth 4 (four) times daily as needed.  pantoprazole (PROTONIX) 40 MG tablet Take 1 tablet (40 mg total) by mouth daily before breakfast.   polyethylene glycol-electrolytes (TRILYTE) 420 g solution Take 4,000 mLs by mouth as directed.   pravastatin (PRAVACHOL) 80 MG tablet TAKE 1 TABLET BY MOUTH EVERYDAY AT BEDTIME   prednisoLONE acetate (PRED FORTE) 1 % ophthalmic suspension Place 1 drop into both eyes 2 (two) times daily.   psyllium (METAMUCIL SMOOTH TEXTURE) 58.6 % powder Take 1 packet by mouth at bedtime.   ramipril (ALTACE) 10 MG capsule Take 1 capsule (10 mg total) by mouth 2 (two) times daily.     Allergies:   Sectral [acebutolol hcl], Sulfonamide derivatives, and Zocor [simvastatin]   Social History   Socioeconomic History   Marital status: Married    Spouse name: Not on file   Number of children: Not on file   Years of education: Not on file   Highest education level: Not on file  Occupational History   Occupation: retired  Tobacco Use   Smoking status: Former    Packs/day: 0.25    Years: 3.00    Total pack years: 0.75    Types: Cigarettes    Quit date: 03/30/1969    Years since quitting: 52.9    Passive exposure: Past   Smokeless tobacco: Never  Vaping Use   Vaping Use: Never used  Substance and Sexual Activity   Alcohol use: Yes    Alcohol/week: 14.0 standard drinks of alcohol    Types: 7 Glasses of wine, 7 Shots of liquor per week    Comment: Jack nightly   Drug use: No   Sexual activity: Yes  Other Topics Concern    Not on file  Social History Narrative   Not on file   Social Determinants of Health   Financial Resource Strain: Low Risk  (11/12/2020)   Overall Financial Resource Strain (CARDIA)    Difficulty of Paying Living Expenses: Not hard at all  Food Insecurity: No Food Insecurity (11/25/2021)   Hunger Vital Sign    Worried About Running Out of Food in the Last Year: Never true    Ran Out of Food in the Last Year: Never true  Transportation Needs: No Transportation Needs (11/25/2021)   PRAPARE - Hydrologist (Medical): No    Lack of Transportation (Non-Medical): No  Physical Activity: Sufficiently Active (11/25/2021)   Exercise Vital Sign    Days of Exercise per Week: 5 days    Minutes of Exercise per Session: 30 min  Stress: No Stress Concern Present (11/25/2021)   [place]    Feeling of Stress : Not at all  Social Connections: [place] (11/25/2021)   Social Connection and Isolation Panel [NHANES]    Frequency of Communication with Friends and Family: More than three times a week    Frequency of Social Gatherings with Friends and Family: More than three times a week    Attends Religious Services: More than 4 times per year    Active Member of [employer] or Organizations: Yes    Attends Music therapist: More than 4 times per year    Marital Status: Married     Family History: The patient's family history includes Colon cancer in his sister; Diabetes in his brother; Healthy in his daughter; Heart disease in his father and another family member; Hypertension in his mother and another family member; Transient ischemic attack in his mother.  ROS:  Please see the history of present illness.     All other systems reviewed and are negative.  EKGs/Labs/Other Studies Reviewed:    The following studies were reviewed today:  ZIO 10/2021: Dominant rhythm was sinus rhythm 3.8%  supraventricular ectopy Less than 1% ventricular ectopy Triggered episodes associated with sinus rhythm, occasionally sinus rhythm with supraventricular ectopy  Echocardiogram 02/06/2022:   1. Left ventricular ejection fraction, by estimation, is 60 to 65%. The  left ventricle has normal function. The left ventricle has no regional  wall motion abnormalities. There is severe asymmetric left ventricular  hypertrophy. Left ventricular diastolic   parameters are consistent with Grade II diastolic dysfunction  (pseudonormalization). Elevated left atrial pressure.   2. Right ventricular systolic function is normal. The right ventricular  size is normal. There is normal pulmonary artery systolic pressure.   3. Left atrial size was severely dilated.   4. Right atrial size was moderately dilated.   5. Mild mitral valve regurgitation.   6. AV is thickened, calcified with mildly restricted motion. Peak and  mean gradients through the valve are 16 and 8 mm Hg respectively AVA (VTI)  is 1.94 cm2. Dimensionless index is 0.56 consistent with mild AS.Marland Kitchen The  aortic valve is tricuspid. Aortic  valve regurgitation is trivial.   7. The inferior vena cava is normal in size with greater than 50%  respiratory variability, suggesting right atrial pressure of 3 mmHg.   Nuclear stress test 02/06/2022:   Lexiscan stress shows no EKG changes to suggest ischemia   Myoview scan shows thinning with decreased tracer activity in the inferior/ inferoseptal wall (base, mid) and apex  Similar in the stress and rest images   Consistent with probable soft tissue attenuation (diaphragm).  No ischemia or scar.   LVEF 58 % with normal wall motion   Overall low risk scan   Recent Labs: 10/30/2021: Hemoglobin 13.9; Magnesium 2.1; Platelets 246 12/15/2021: ALT 17; BUN 18; Creatinine, Ser 1.09; Potassium 4.4; Sodium 141  Recent Lipid Panel    Component Value Date/Time   CHOL 161 12/15/2021 0829   TRIG 74 12/15/2021 0829    HDL 48 12/15/2021 0829   CHOLHDL 3.4 12/15/2021 0829   CHOLHDL 2.9 03/16/2014 1219   VLDL 12 03/16/2014 1219   LDLCALC 99 12/15/2021 0829     Risk Assessment/Calculations:              Physical Exam:    VS:  BP (!) 142/74   Pulse 72   Ht '5\' 6"'$  (1.676 m)   Wt 191 lb 12.8 oz (87 kg)   SpO2 94%   BMI 30.96 kg/m     Wt Readings from Last 3 Encounters:  02/11/22 191 lb 12.8 oz (87 kg)  01/26/22 187 lb (84.8 kg)  01/12/22 187 lb 6.4 oz (85 kg)     GEN:  Well nourished, well developed in no acute distress HEENT: Normal NECK: No JVD; No carotid bruits LYMPHATICS: No lymphadenopathy CARDIAC: RRR, 2/6 systolic murmur right upper sternal border, no rubs, gallops RESPIRATORY:  Clear to auscultation without rales, wheezing or rhonchi  ABDOMEN: Soft, non-tender, non-distended MUSCULOSKELETAL:  No edema; No deformity easy bruising on forearms SKIN: Warm and dry NEUROLOGIC:  Alert and oriented x 3 PSYCHIATRIC:  Normal affect   ASSESSMENT:    1. DOE (dyspnea on exertion)   2. Paroxysmal atrial fibrillation (HCC)   3. Essential hypertension   4. Aortic atherosclerosis (Catar[place])   5. Other hyperlipidemia    PLAN:  In order of problems listed above:  Shortness of breath - Thankfully overall reassuring echocardiogram and stress test.  Mild aortic stenosis should be of no clinical significance at this point.  No evidence of anemia.  Deep breathing through his nose and out through his mouth seems to have helped him.  He is feeling much better currently.  Continue to monitor.  Paroxysmal atrial fibrillation - Continuing with Eliquis metoprolol, as needed diltiazem.  Seen previously in the A-fib clinic.  Palpitations daily but occurred with atrial tachycardia and PVCs.  Metoprolol was increased to 25 twice daily in the past.  He does have some easy bruising.  He would not be a candidate for watchman because he needs Eliquis for his prior DVT PE.  Hyperlipidemia - On pravastatin  managed by Dr. Wolfgang Phoenix.  LDL 99.  ALT 17.  Hypertension - Continues to be mildly elevated  Aortic stenosis - Murmur heard on exam.  Mild aortic stenosis.  Continue to monitor clinically.  Consider repeat echocardiogram in 3 years.  13-monthfollow-up.      Medication Adjustments/Labs and Tests Ordered: Current medicines are reviewed at length with the patient today.  Concerns regarding medicines are outlined above.  No orders of the defined types were placed in this encounter.  No orders of the defined types were placed in this encounter.   Patient Instructions  Medication Instructions:  Your physician recommends that you continue on your current medications as directed. Please refer to the Current Medication list given to you today.   Labwork: None today  Testing/Procedures: None today  Follow-Up: 6 months  Any Other Special Instructions Will Be Listed Below (If Applicable).  If you need a refill on your cardiac medications before your next appointment, please call your pharmacy.    Signed, MCandee Furbish MD  02/11/2022 1:53 PM    [place]

## 2022-02-12 ENCOUNTER — Telehealth: Payer: Self-pay

## 2022-02-12 NOTE — Telephone Encounter (Signed)
Patient called and states is checking on the breathing test that the doctor ordered at last visit, he had a cardiology appt yesterday and says all checked out ok, message sent to referral team.

## 2022-02-17 ENCOUNTER — Telehealth: Payer: Self-pay

## 2022-02-17 NOTE — Telephone Encounter (Signed)
Patient call in reference to shortness of breath per wife's message.   -----------------------------------------------------------------------------------------  Please touch base with patient if he if he is having significant shortness of breath I could see him tomorrow otherwise will be next week, open slot obviously if he cannot come tomorrow but he feels he is having severe issues he may have to go to the ER-hopefully not

## 2022-02-18 ENCOUNTER — Telehealth: Payer: Self-pay | Admitting: Family Medicine

## 2022-02-18 ENCOUNTER — Ambulatory Visit (HOSPITAL_COMMUNITY)
Admission: RE | Admit: 2022-02-18 | Discharge: 2022-02-18 | Disposition: A | Discharge status: Home / Self Care | Payer: PPO | Source: Ambulatory Visit | Attending: Family Medicine | Admitting: Family Medicine

## 2022-02-18 ENCOUNTER — Other Ambulatory Visit: Payer: Self-pay | Admitting: *Deleted

## 2022-02-18 ENCOUNTER — Other Ambulatory Visit (HOSPITAL_COMMUNITY)
Admission: RE | Admit: 2022-02-18 | Discharge: 2022-02-18 | Disposition: A | Discharge status: Home / Self Care | Payer: PPO | Source: Ambulatory Visit | Attending: Family Medicine | Admitting: Family Medicine

## 2022-02-18 ENCOUNTER — Ambulatory Visit (INDEPENDENT_AMBULATORY_CARE_PROVIDER_SITE_OTHER): Payer: PPO | Admitting: Family Medicine

## 2022-02-18 VITALS — BP 132/62 | HR 72 | Temp 98.6°F | Ht 66.0 in | Wt 190.0 lb

## 2022-02-18 DIAGNOSIS — R7989 Other specified abnormal findings of blood chemistry: Secondary | ICD-10-CM

## 2022-02-18 DIAGNOSIS — R0602 Shortness of breath: Secondary | ICD-10-CM | POA: Diagnosis not present

## 2022-02-18 DIAGNOSIS — Z23 Encounter for immunization: Secondary | ICD-10-CM

## 2022-02-18 DIAGNOSIS — J9811 Atelectasis: Secondary | ICD-10-CM | POA: Diagnosis not present

## 2022-02-18 DIAGNOSIS — R0609 Other forms of dyspnea: Secondary | ICD-10-CM | POA: Insufficient documentation

## 2022-02-18 DIAGNOSIS — I5189 Other ill-defined heart diseases: Secondary | ICD-10-CM | POA: Diagnosis not present

## 2022-02-18 DIAGNOSIS — R0600 Dyspnea, unspecified: Secondary | ICD-10-CM

## 2022-02-18 LAB — D-DIMER, QUANTITATIVE: D-Dimer, Quant: 0.61 ug/mL-FEU — ABNORMAL HIGH (ref 0.00–0.50)

## 2022-02-18 LAB — BRAIN NATRIURETIC PEPTIDE: B Natriuretic Peptide: 206 pg/mL — ABNORMAL HIGH (ref 0.0–100.0)

## 2022-02-18 MED ORDER — TORSEMIDE 20 MG PO TABS: ORAL_TABLET | ORAL | 4 refills | Status: DC

## 2022-02-18 MED ORDER — IOHEXOL 350 MG/ML SOLN
80.0000 mL | Freq: Once | INTRAVENOUS | Status: AC | PRN
  Administered 2022-02-18: 80 mL via INTRAVENOUS

## 2022-02-18 NOTE — Telephone Encounter (Signed)
Front Recently patient was seen here had a CAT scan I have already talked with the patient  We need to set him up at the end of next week with me and one of my open slots for a follow-up regarding dyspnea on exertion thank you

## 2022-02-18 NOTE — Progress Notes (Signed)
   Subjective:    Patient ID: [name], male    DOB: 05-28-1940, 81 y.o.   MRN: [id number]  HPI Winded / SOB x 3 months  Significant fatigue tiredness shortness of breath with activity.  In addition to this relates PND.  Denies chest pain but does relate tightness with activity was seen by cardiology they did echo showed diastolic dysfunction also ejection fraction was good  Review of Systems     Objective:   Physical Exam  Extremities no edema heart irregular but rate is controlled lungs are clear no crackles      Assessment & Plan:   1. DOE (dyspnea on exertion) Concerning for the possibility of diastolic dysfunction with possible congestive heart failure recommend chest x-ray recommend BMP - Brain natriuretic peptide - D-dimer, quantitative - Pulmonary function test - DG Chest 2 View - Ambulatory referral to Pulmonology  2. PND (paroxysmal nocturnal dyspnea) Concerning for the possibility of fluid overload need to check lab work possibly change medicines - Brain natriuretic peptide - D-dimer, quantitative - Pulmonary function test - Ambulatory referral to Pulmonology  3. Diastolic dysfunction See per above check BMP - Brain natriuretic peptide - D-dimer, quantitative - Pulmona ry function test - Ambulatory referral to Pulmonology  4. Immunization due Flu shot today - Flu Vaccine QUAD High Dose(Fluad)  Labs showed elevated D-dimer elevated BNP chest x-ray looks good very important to go forward with additional testing including CTA to rule out possibility of pulmonary embolus likelihood of pulmonary embolus is low because he is on Eliquis but is possible in addition to this we will switch diuretics from indapamide to torsemide

## 2022-02-18 NOTE — Addendum Note (Signed)
Addended by: Dairl Ponder on: 02/18/2022 04:43 PM   Modules accepted: Orders

## 2022-02-19 ENCOUNTER — Telehealth: Payer: Self-pay

## 2022-02-19 NOTE — Telephone Encounter (Signed)
Called to [facility] de[facility] [phone] and left message for call back to have patient scheduled for PFT's.

## 2022-02-25 ENCOUNTER — Other Ambulatory Visit: Payer: Self-pay | Admitting: Family Medicine

## 2022-02-25 ENCOUNTER — Ambulatory Visit (HOSPITAL_COMMUNITY)
Admission: RE | Admit: 2022-02-25 | Discharge: 2022-02-25 | Disposition: A | Discharge status: Home / Self Care | Payer: PPO | Source: Ambulatory Visit | Attending: Family Medicine | Admitting: Family Medicine

## 2022-02-25 ENCOUNTER — Encounter: Payer: Self-pay | Admitting: Family Medicine

## 2022-02-25 ENCOUNTER — Ambulatory Visit (INDEPENDENT_AMBULATORY_CARE_PROVIDER_SITE_OTHER): Payer: PPO | Admitting: Family Medicine

## 2022-02-25 VITALS — BP 122/72 | Wt 187.0 lb

## 2022-02-25 DIAGNOSIS — S99911A Unspecified injury of right ankle, initial encounter: Secondary | ICD-10-CM | POA: Diagnosis not present

## 2022-02-25 DIAGNOSIS — M25571 Pain in right ankle and joints of right foot: Secondary | ICD-10-CM | POA: Diagnosis not present

## 2022-02-25 DIAGNOSIS — R7989 Other specified abnormal findings of blood chemistry: Secondary | ICD-10-CM

## 2022-02-25 DIAGNOSIS — M19071 Primary osteoarthritis, right ankle and foot: Secondary | ICD-10-CM | POA: Diagnosis not present

## 2022-02-25 DIAGNOSIS — R069 Unspecified abnormalities of breathing: Secondary | ICD-10-CM | POA: Diagnosis not present

## 2022-02-25 NOTE — Progress Notes (Signed)
   Subjective:    Patient ID: [name], male    DOB: 1940-12-08, 81 y.o.   MRN: [id number]  HPI Pt following up on shortness of breath/DOE. Pt states that he has seen an improvement since taking the fluid pill.  Pt states he thinks he may have broken his right ankle yesterday. Pt was unhooking his trailer and is having issues with walking.   Last week patient was exhibiting symptoms of congestive heart failure with shortness of breath having to sit up on the edge of bed BNP slightly elevated we switched from indapamide to Demadex he states he is feeling better he breathes better not having any nighttime PND he states overall he feels improved Review of Systems     Objective:   Physical Exam Lungs are clear hearts regular no crackles pulses normal extremities trace edema right ankle pain and tenderness       Assessment & Plan:  Ankle x-ray ordered await the results More than likely a sprain x-ray negative should get better over the next couple weeks  Move forward with metabolic 7 to look at the [facility]  Patient doing much better on the diuretic breathing is doing better we will continue current measures check metabolic 7  Patient has follow-up in November

## 2022-02-26 ENCOUNTER — Other Ambulatory Visit: Payer: Self-pay | Admitting: Family Medicine

## 2022-02-26 LAB — BASIC METABOLIC PANEL
BUN/Creatinine Ratio: 21 (ref 10–24)
BUN: 29 mg/dL — ABNORMAL HIGH (ref 8–27)
CO2: 26 mmol/L (ref 20–29)
Calcium: 9.6 mg/dL (ref 8.6–10.2)
Chloride: 101 mmol/L (ref 96–106)
Creatinine, Ser: 1.41 mg/dL — ABNORMAL HIGH (ref 0.76–1.27)
Glucose: 94 mg/dL (ref 70–99)
Potassium: 4.1 mmol/L (ref 3.5–5.2)
Sodium: 143 mmol/L (ref 134–144)
eGFR: 50 mL/min/{1.73_m2} — ABNORMAL LOW (ref >59)

## 2022-02-26 LAB — SPECIMEN STATUS REPORT

## 2022-02-26 MED ORDER — TORSEMIDE 10 MG PO TABS: 10.0000 mg | ORAL_TABLET | Freq: Every day | ORAL | 5 refills | Status: DC

## 2022-03-02 ENCOUNTER — Other Ambulatory Visit: Payer: Self-pay | Admitting: *Deleted

## 2022-03-02 DIAGNOSIS — R7989 Other specified abnormal findings of blood chemistry: Secondary | ICD-10-CM

## 2022-03-05 ENCOUNTER — Ambulatory Visit
Admission: EM | Admit: 2022-03-05 | Discharge: 2022-03-05 | Disposition: A | Discharge status: Home / Self Care | Payer: PPO | Attending: Nurse Practitioner | Admitting: Nurse Practitioner

## 2022-03-05 DIAGNOSIS — J069 Acute upper respiratory infection, unspecified: Secondary | ICD-10-CM

## 2022-03-05 MED ORDER — GUAIFENESIN 100 MG/5ML PO LIQD: 5.0000 mL | Freq: Four times a day (QID) | ORAL | 0 refills | Status: AC | PRN

## 2022-03-05 MED ORDER — AZITHROMYCIN 250 MG PO TABS: 250.0000 mg | ORAL_TABLET | Freq: Every day | ORAL | 0 refills | Status: DC

## 2022-03-05 NOTE — ED Triage Notes (Signed)
Pt reports cough, chest congestion x 1 1/2 week. Sates he feels he has a bad cold. Pt has not taken any meds for complaints.

## 2022-03-05 NOTE — ED Provider Notes (Signed)
[facility]    CSN: [id number] Arrival date & time: 03/05/22  0949      History   Chief Complaint No chief complaint on file.   HPI [name] is a 81 y.o. male.   The history is provided by the patient.   Patient presents for 1-1/2-week history of cough and chest congestion.  Patient states that he does have shortness of breath, but this has been present over the last 3 months.  He is currently under the care of his PCP.  Patient denies fever, chills, headache, sore throat, wheezing, abdominal pain, nausea, vomiting, or diarrhea.  Patient had a CT scan and a chest x-ray on 02/18/2022 which did not indicate any signs of pleural effusion.  Patient reports that his daughter is also sick with the same symptoms.  He states that he has not taken any medication for his symptoms.  Denies any sick contacts.  Reports he has received his influenza and COVID vaccines.  Past Medical History:  Diagnosis Date   Arthritis    Cancer ([place])    skin   Carpal tunnel syndrome    Cataract    left eye   Chronic back pain    multiple back surgeries   Complication of anesthesia    pt states woke up during hemorroid surgery   DVT (deep venous thrombosis) ([place]) 03/2007   left leg    Dysrhythmia    GERD (gastroesophageal reflux disease)    takes Protonix daily   Glaucoma    History of colon polyps    History of hyperglycemia    History of MRSA infection 1992   HTN (hypertension)    takes  Ramipril daily   Hyperlipidemia    takes Pravastatin 3 times a week   Nocturia    Paroxysmal atrial fibrillation ([facility])    takes Pradaxa daily, Chads2vasc score 2(07/11/14)   Pneumonia    many yrs ago   Pulmonary embolism ([place]) 2008    Patient Active Problem List   Diagnosis Date Noted   Pain of neck with recent traumatic injury 07/11/2021   Lumbar stenosis with neurogenic claudication 05/06/2021   Constipation    S/P right knee arthroscopy 08/10/2018   Acute tear lateral meniscus,  right, initial encounter 08/03/2018   Acute medial meniscus tear, right, subsequent encounter 07/13/2018   Closed fracture of lateral portion of left tibial plateau 03/09/2018   Aortic atherosclerosis ([place]) 11/05/2016   History of colonic polyps 06/16/2016   Pseudoarthrosis of lumbar spine 04/24/2014   Hx of adenomatous colonic polyps 11/22/2013   Prediabetes 10/12/2013   Snoring 12/21/2012   Chronic back pain greater than 3 months duration 10/31/2012   Gastroesophageal reflux disease 02/22/2012   Hyperlipidemia 01/21/2012   HYPERTENSION, BENIGN 12/06/2009   ATRIAL FIBRILLATION, PAROXYSMAL 12/06/2009    Past Surgical History:  Procedure Laterality Date   APPLICATION OF ROBOTIC ASSISTANCE FOR SPINAL PROCEDURE  05/06/2021   Procedure: APPLICATION OF ROBOTIC ASSISTANCE FOR SPINAL PROCEDURE;  Surgeon: Kristeen Miss, MD;  Location: [place];  Service: Neurosurgery;;   BACK SURGERY     BIOPSY  04/01/2018   Procedure: BIOPSY;  Surgeon: Rogene Houston, MD;  [facility];  Service: Endoscopy;;  gastric   CARDIAC CATHETERIZATION  05/18/2006   CARPAL TUNNEL RELEASE Right    cataract surgery Right    cervical and lumbar surgery     COLONOSCOPY  04/24/2011   Procedure: COLONOSCOPY;  Surgeon: Rogene Houston, MD;  [facility];  Service: Endoscopy;  Laterality: N/A;  1200   COLONOSCOPY N/A 08/13/2016   Procedure: COLONOSCOPY;  Surgeon: Rogene Houston, MD;  [facility];  Service: Endoscopy;  Laterality: N/A;  1200   ESOPHAGOGASTRODUODENOSCOPY  03/10/2012   Procedure: ESOPHAGOGASTRODUODENOSCOPY (EGD);  Surgeon: Rogene Houston, MD;  [facility];  Service: Endoscopy;  Laterality: N/A;  325   ESOPHAGOGASTRODUODENOSCOPY (EGD) WITH PROPOFOL N/A 04/01/2018   Procedure: ESOPHAGOGASTRODUODENOSCOPY (EGD) WITH PROPOFOL;  Surgeon: Rogene Houston, MD;  [facility];  Service: Endoscopy;  Laterality: N/A;   EYE SURGERY     HARDWARE REMOVAL N/A  04/24/2014   Procedure: REMOVAL OF SACRAL INSTRUMENTATION WITH [place]an;  Surgeon: Kristeen Miss, MD;  Location: [facility];  Service: Neurosurgery;  Laterality: N/A;   HEMORRHOIDECTOMY WITH HEMORRHOID BANDING     JOINT REPLACEMENT     KNEE ARTHROSCOPY Right    KNEE ARTHROSCOPY WITH MEDIAL MENISECTOMY Right 08/03/2018   Procedure: RIGHT KNEE ARTHROSCOPY WITH PARTIAL MEDIAL MENISCECTOMY;  Surgeon: Leandrew Koyanagi, MD;  Location: [place];  Service: Orthopedics;  Laterality: Right;   KNEE SURGERY     left   LASIK     POLYPECTOMY  08/13/2016   Procedure: POLYPECTOMY;  Surgeon: Rogene Houston, MD;  [facility];  Service: Endoscopy;;  colon   PVI  01/24/2010   afib ablation   TRANSFORAMINAL LUMBAR INTERBODY FUSION (TLIF) WITH PEDICLE SCREW FIXATION 1 LEVEL N/A 05/06/2021   Procedure: Transforaminal Lumbar Interbody Fusion  - Thoracic twelve-Lumbar one fixation Thoracic nine to Lumbar one with methacrylate augmentation with mazor;  Surgeon: Kristeen Miss, MD;  Location: [place];  Service: Neurosurgery;  Laterality: N/A;       Home Medications    Prior to Admission medications   Medication Sig Start Date End Date Taking? Authorizing Provider  azithromycin (ZITHROMAX) 250 MG tablet Take 1 tablet (250 mg total) by mouth daily. Take first 2 tablets today, then take 1 every day until finished. 03/05/22  Yes Kenyata Guess-Warren, Alda Lea, NP  guaiFENesin (ROBITUSSIN) 100 MG/5ML liquid Take 5 mLs by mouth every 6 (six) hours as needed for up to 10 days for cough or to loosen phlegm. 03/05/22 03/15/22 Yes Chris Cripps-Warren, Alda Lea, NP  acetaminophen (TYLENOL) 650 MG CR tablet Take 650 mg by mouth every 8 (eight) hours as needed for pain.    [provider]  amLODipine (NORVASC) 10 MG tablet TAKE 1 TABLET BY MOUTH EVERY DAY 10/06/21   Kathyrn Drown, MD  apixaban (ELIQUIS) 5 MG TABS tablet TAKE 1 TABLET BY MOUTH TWICE A DAY. STOP PRADAXA 07/17/21   Kathyrn Drown, MD   diltiazem (CARDIZEM) 30 MG tablet Take 30 mg by mouth as needed (for A-fib episodes).    [provider]  diphenhydrAMINE-APAP, sleep, (TYLENOL PM EXTRA STRENGTH PO) Take 1 tablet by mouth at bedtime as needed (sleep).    [provider]  gatifloxacin (ZYMAXID) 0.5 % SOLN Place 1 drop into the left eye 3 (three) times daily. 11/20/21   [provider]  ketorolac (ACULAR) 0.5 % ophthalmic solution Place 1 drop into the left eye 2 (two) times daily. 11/20/21   [provider]  latanoprost (XALATAN) 0.005 % ophthalmic solution Place 1 drop into both eyes at bedtime.    [provider]  metoprolol succinate (TOPROL-XL) 25 MG 24 hr tablet Take 1 tablet (25 mg total) by mouth 2 (two) times daily. 12/24/21 12/24/22  Roderic Palau  C, NP  Oxycodone HCl 10 MG TABS Take 1 tablet (10 mg total) by mouth 4 (four) times daily as needed. 01/12/22   Kathyrn Drown, MD  Oxycodone HCl 10 MG TABS Take 1 tablet (10 mg total) by mouth 4 (four) times daily as needed. 01/12/22   Kathyrn Drown, MD  Oxycodone HCl 10 MG TABS Take 1 tablet (10 mg total) by mouth 4 (four) times daily as needed. 01/12/22   Kathyrn Drown, MD  pantoprazole (PROTONIX) 40 MG tablet Take 1 tablet (40 mg total) by mouth daily before breakfast. 10/09/21   Kathyrn Drown, MD  polyethylene glycol-electrolytes (TRILYTE) 420 g solution Take 4,000 mLs by mouth as directed. 09/29/21   Harvel Quale, MD  pravastatin (PRAVACHOL) 80 MG tablet TAKE 1 TABLET BY MOUTH EVERYDAY AT BEDTIME 10/06/21   Kathyrn Drown, MD  prednisoLONE acetate (PRED FORTE) 1 % ophthalmic suspension Place 1 drop into both eyes 2 (two) times daily. 12/17/21   [provider]  psyllium (METAMUCIL SMOOTH TEXTURE) 58.6 % powder Take 1 packet by mouth at bedtime. 01/14/21   Rogene Houston, MD  ramipril (ALTACE) 10 MG capsule Take 1 capsule (10 mg total) by mouth 2 (two) times daily. 10/09/21   Kathyrn Drown, MD  torsemide (DEMADEX)  10 MG tablet Take 1 tablet (10 mg total) by mouth daily. 02/26/22   Kathyrn Drown, MD    Family History Family History  Problem Relation Age of Onset   Hypertension Mother    Transient ischemic attack Mother    Heart disease Father    Diabetes Brother    Healthy Daughter    Colon cancer Sister    Hypertension Other    Heart disease Other     Social History Social History   Tobacco Use   Smoking status: Former    Packs/day: 0.25    Years: 3.00    Total pack years: 0.75    Types: Cigarettes    Quit date: 03/30/1969    Years since quitting: 52.9    Passive exposure: Past   Smokeless tobacco: Never  Vaping Use   Vaping Use: Never used  Substance Use Topics   Alcohol use: Yes    Alcohol/week: 14.0 standard drinks of alcohol    Types: 7 Glasses of wine, 7 Shots of liquor per week    Comment: Jack nightly   Drug use: No     Allergies   Sectral [acebutolol hcl], Sulfonamide derivatives, and Zocor [simvastatin]   Review of Systems Review of Systems Per HPI  Physical Exam Triage Vital Signs ED Triage Vitals  Enc Vitals Group     BP 03/05/22 0959 (!) 141/89     Pulse Rate 03/05/22 0959 75     Resp 03/05/22 0959 (!) 26     Temp 03/05/22 0959 97.8 F (36.6 C)     Temp Source 03/05/22 0959 Oral     SpO2 03/05/22 0959 95 %     Weight --      Height --      Head Circumference --      Peak Flow --      Pain Score 03/05/22 1001 0     Pain Loc --      Pain Edu? --      Excl. in [place]? --    No data found.  Updated Vital Signs BP (!) 141/89 (BP Location: Right Arm)   Pulse 75   Temp 97.8 F (36.6  C) (Oral)   Resp (!) 26   SpO2 95%   Visual Acuity Right Eye Distance:   Left Eye Distance:   Bilateral Distance:    Right Eye Near:   Left Eye Near:    Bilateral Near:     Physical Exam Vitals and nursing note reviewed.  Constitutional:      General: He is not in acute distress.    Appearance: Normal appearance.  HENT:     Head: Normocephalic.      Right Ear: Tympanic membrane, ear canal and external ear normal.     Left Ear: Ear canal and external ear normal.     Nose: Nose normal.     Mouth/Throat:     Mouth: Mucous membranes are moist.  Eyes:     Extraocular Movements: Extraocular movements intact.     Conjunctiva/sclera: Conjunctivae normal.     Pupils: Pupils are equal, round, and reactive to light.  Cardiovascular:     Rate and Rhythm: Normal rate and regular rhythm.     Pulses: Normal pulses.     Heart sounds: Normal heart sounds.  Pulmonary:     Effort: Pulmonary effort is normal. No respiratory distress.     Breath sounds: Normal breath sounds. No stridor. No wheezing, rhonchi or rales.  Abdominal:     General: Bowel sounds are normal.     Palpations: Abdomen is soft.  Musculoskeletal:     Cervical back: Normal range of motion.  Lymphadenopathy:     Cervical: No cervical adenopathy.  Skin:    General: Skin is warm and dry.  Neurological:     General: No focal deficit present.     Mental Status: He is alert and oriented to person, place, and time.  Psychiatric:        Mood and Affect: Mood normal.        Behavior: Behavior normal.      UC Treatments / Results  Labs (all labs ordered are listed, but only abnormal results are displayed) Labs Reviewed - No data to display  EKG   Radiology No results found.  Procedures Procedures (including critical care time)  Medications Ordered in UC Medications - No data to display  Initial Impression / Assessment and Plan / UC Course  I have reviewed the triage vital signs and the nursing notes.  Pertinent labs & imaging results that were available during my care of the patient were reviewed by me and considered in my medical decision making (see chart for details).  Patient presents for cough and chest congestion that has been present for the past 1.5 weeks.  Patient is well-appearing, and is not in any acute distress.  He is tachypneic with a rate of 26 and  mildly hypertensive.  No indication for COVID/flu testing based on the duration of the patient's symptoms and current presentation.  He does not exhibit any wheezing, rhonchi, rales, consistent with pneumonia.  However,will treat patient prophylactically for bacterial lung infection with azithromycin 250 mg based on his age, duration of symptoms, and current symptoms..  Patient was also given Robitussin-DM as a cough expectorant.  Supportive care recommendations were provided to the patient to include over-the-counter Tylenol or ibuprofen for pain or fever, warm salt water gargles as needed, and use of a humidifier at bedtime.  Patient was advised to follow-up with his primary care physician if symptoms fail to improve.  Patient verbalizes understanding.  All questions were answered.  Patient is stable for discharge. Final Clinical Impressions(s) /  UC Diagnoses   Final diagnoses:  Acute upper respiratory infection     Discharge Instructions      Take medication as prescribed. Increase fluids and allow for plenty of rest. Recommend Tylenol or ibuprofen as needed for pain, fever, or general discomfort. Warm salt water gargles 3-4 times daily to help with throat pain or discomfort. Recommend using a humidifier at bedtime during sleep to help with cough and nasal congestion. Sleep elevated on 2 pillows. Follow-up with your primary care physician if symptoms fail to improve.     ED Prescriptions     Medication Sig Dispense Auth. Provider   azithromycin (ZITHROMAX) 250 MG tablet Take 1 tablet (250 mg total) by mouth daily. Take first 2 tablets today, then take 1 every day until finished. 6 tablet Kincaid Tiger-Warren, Alda Lea, NP   guaiFENesin (ROBITUSSIN) 100 MG/5ML liquid Take 5 mLs by mouth every 6 (six) hours as needed for up to 10 days for cough or to loosen phlegm. 200 mL Milani Lowenstein-Warren, Alda Lea, NP      PDMP not reviewed this encounter.   Tish Men, NP 03/05/22 1102

## 2022-03-05 NOTE — Discharge Instructions (Addendum)
Take medication as prescribed. Increase fluids and allow for plenty of rest. Recommend Tylenol or ibuprofen as needed for pain, fever, or general discomfort. Warm salt water gargles 3-4 times daily to help with throat pain or discomfort. Recommend using a humidifier at bedtime during sleep to help with cough and nasal congestion. Sleep elevated on 2 pillows. Follow-up with your primary care physician if symptoms fail to improve.

## 2022-03-06 DIAGNOSIS — R7989 Other specified abnormal findings of blood chemistry: Secondary | ICD-10-CM | POA: Diagnosis not present

## 2022-03-07 LAB — BASIC METABOLIC PANEL (7)
BUN/Creatinine Ratio: 14 (ref 10–24)
BUN: 17 mg/dL (ref 8–27)
CO2: 26 mmol/L (ref 20–29)
Chloride: 100 mmol/L (ref 96–106)
Creatinine, Ser: 1.25 mg/dL (ref 0.76–1.27)
Glucose: 95 mg/dL (ref 70–99)
Potassium: 3.9 mmol/L (ref 3.5–5.2)
Sodium: 142 mmol/L (ref 134–144)
eGFR: 58 mL/min/{1.73_m2} — ABNORMAL LOW (ref >59)

## 2022-03-12 DIAGNOSIS — L570 Actinic keratosis: Secondary | ICD-10-CM | POA: Diagnosis not present

## 2022-03-12 DIAGNOSIS — L910 Hypertrophic scar: Secondary | ICD-10-CM | POA: Diagnosis not present

## 2022-03-12 DIAGNOSIS — X32XXXD Exposure to sunlight, subsequent encounter: Secondary | ICD-10-CM | POA: Diagnosis not present

## 2022-03-16 ENCOUNTER — Other Ambulatory Visit: Payer: Self-pay | Admitting: *Deleted

## 2022-03-16 ENCOUNTER — Other Ambulatory Visit (HOSPITAL_COMMUNITY)
Admission: RE | Admit: 2022-03-16 | Discharge: 2022-03-16 | Disposition: A | Discharge status: Home / Self Care | Payer: PPO | Source: Ambulatory Visit | Attending: Family Medicine | Admitting: Family Medicine

## 2022-03-16 DIAGNOSIS — R0609 Other forms of dyspnea: Secondary | ICD-10-CM | POA: Diagnosis not present

## 2022-03-16 DIAGNOSIS — R7989 Other specified abnormal findings of blood chemistry: Secondary | ICD-10-CM

## 2022-03-16 DIAGNOSIS — R600 Localized edema: Secondary | ICD-10-CM

## 2022-03-16 DIAGNOSIS — R0600 Dyspnea, unspecified: Secondary | ICD-10-CM | POA: Diagnosis not present

## 2022-03-16 LAB — BASIC METABOLIC PANEL
Anion gap: 8 (ref 5–15)
BUN: 23 mg/dL (ref 8–23)
CO2: 27 mmol/L (ref 22–32)
Calcium: 9 mg/dL (ref 8.9–10.3)
Chloride: 107 mmol/L (ref 98–111)
Creatinine, Ser: 1.26 mg/dL — ABNORMAL HIGH (ref 0.61–1.24)
GFR, Estimated: 57 mL/min — ABNORMAL LOW (ref >60)
Glucose, Bld: 156 mg/dL — ABNORMAL HIGH (ref 70–99)
Potassium: 4 mmol/L (ref 3.5–5.1)
Sodium: 142 mmol/L (ref 135–145)

## 2022-03-16 LAB — BRAIN NATRIURETIC PEPTIDE: B Natriuretic Peptide: 326 pg/mL — ABNORMAL HIGH (ref 0.0–100.0)

## 2022-03-17 ENCOUNTER — Ambulatory Visit (INDEPENDENT_AMBULATORY_CARE_PROVIDER_SITE_OTHER): Payer: PPO | Admitting: Family Medicine

## 2022-03-17 VITALS — BP 166/77 | HR 66 | Temp 98.2°F | Wt 191.0 lb

## 2022-03-17 DIAGNOSIS — R350 Frequency of micturition: Secondary | ICD-10-CM

## 2022-03-17 DIAGNOSIS — N401 Enlarged prostate with lower urinary tract symptoms: Secondary | ICD-10-CM | POA: Diagnosis not present

## 2022-03-17 DIAGNOSIS — R109 Unspecified abdominal pain: Secondary | ICD-10-CM

## 2022-03-17 DIAGNOSIS — N400 Enlarged prostate without lower urinary tract symptoms: Secondary | ICD-10-CM

## 2022-03-17 LAB — POCT URINALYSIS DIP (CLINITEK)
Bilirubin, UA: NEGATIVE
Blood, UA: NEGATIVE
Glucose, UA: NEGATIVE mg/dL
Ketones, POC UA: NEGATIVE mg/dL
Leukocytes, UA: NEGATIVE
Nitrite, UA: NEGATIVE
POC PROTEIN,UA: 30 — AB
Spec Grav, UA: 1.01 (ref 1.010–1.025)
Urobilinogen, UA: 0.2 E.U./dL
pH, UA: 7 (ref 5.0–8.0)

## 2022-03-17 MED ORDER — TAMSULOSIN HCL 0.4 MG PO CAPS: 0.4000 mg | ORAL_CAPSULE | Freq: Every day | ORAL | 3 refills | Status: DC

## 2022-03-17 NOTE — Progress Notes (Addendum)
   Subjective:    Patient ID: [name], male    DOB: 1940-10-04, 81 y.o.   MRN: [id number]  HPI Back pain worse with numbness and tingling worse in the morning and very frequent urination  Palpitations ,patient has noticed extra beat  Recent labs reviewed Patient denies any chest tightness pressure pain shortness of breath Energy level subpar Low back pain related  This verifies that the history review of any tests, physical exam, assessment and plan were conducted by Dr. Sallee [name] and documented accordingly by him today [name] Lange MD primary care [facility]  Review of Systems     Objective:   Physical Exam Lungs clear hearts regular abdomen is soft subjective back pain In the large prostate firm hard  [name] will check with lab to see if they need additional blood for PSA or if they have some leftover from yesterday's blood draw    Assessment & Plan:  Enlarged prostate Frequent urinary issues Worse when taking diuretic Cannot rule out the possibility of prostate cancer versus BPH Flomax Follow-up in 3 weeks PSA ordered No sign of infection Back off on diuretic to 3 days/week

## 2022-03-18 ENCOUNTER — Other Ambulatory Visit: Payer: Self-pay

## 2022-03-18 DIAGNOSIS — N401 Enlarged prostate with lower urinary tract symptoms: Secondary | ICD-10-CM

## 2022-03-19 DIAGNOSIS — R350 Frequency of micturition: Secondary | ICD-10-CM | POA: Diagnosis not present

## 2022-03-19 DIAGNOSIS — N401 Enlarged prostate with lower urinary tract symptoms: Secondary | ICD-10-CM | POA: Diagnosis not present

## 2022-03-20 LAB — PSA: Prostate Specific Ag, Serum: 2.5 ng/mL (ref 0.0–4.0)

## 2022-03-23 NOTE — Addendum Note (Signed)
Addended by: Dairl Ponder on: 03/23/2022 11:00 AM   Modules accepted: Orders

## 2022-03-24 ENCOUNTER — Other Ambulatory Visit: Payer: Self-pay | Admitting: Family Medicine

## 2022-03-24 NOTE — Telephone Encounter (Signed)
Caller name: TALYN DESSERT  On DPR?: Yes  Call back number: [phone] (mobile)  Provider they see: Kathyrn Drown, MD  Reason for call:[phone] Need Refill on  apixaban (ELIQUIS) 5 MG TABS tablet  [place], [place] [facility] [place] AT [facility]

## 2022-03-31 ENCOUNTER — Ambulatory Visit (HOSPITAL_COMMUNITY)
Admission: RE | Admit: 2022-03-31 | Discharge: 2022-03-31 | Disposition: A | Discharge status: Home / Self Care | Payer: PPO | Source: Ambulatory Visit | Attending: Family Medicine | Admitting: Family Medicine

## 2022-03-31 ENCOUNTER — Other Ambulatory Visit: Payer: Self-pay

## 2022-03-31 DIAGNOSIS — R0609 Other forms of dyspnea: Secondary | ICD-10-CM | POA: Insufficient documentation

## 2022-03-31 DIAGNOSIS — I5189 Other ill-defined heart diseases: Secondary | ICD-10-CM | POA: Insufficient documentation

## 2022-03-31 DIAGNOSIS — R0600 Dyspnea, unspecified: Secondary | ICD-10-CM | POA: Diagnosis not present

## 2022-03-31 LAB — PULMONARY FUNCTION TEST
DL/VA % pred: 83 %
DL/VA: 3.3 ml/min/mmHg/L
DLCO unc % pred: 70 %
DLCO unc: 15 ml/min/mmHg
FEF 25-75 Post: 2.36 L/sec
FEF 25-75 Pre: 2.24 L/sec
FEF2575-%Change-Post: 5 %
FEF2575-%Pred-Post: 150 %
FEF2575-%Pred-Pre: 143 %
FEV1-%Change-Post: 2 %
FEV1-%Pred-Post: 100 %
FEV1-%Pred-Pre: 97 %
FEV1-Post: 2.33 L
FEV1-Pre: 2.28 L
FEV1FVC-%Change-Post: 4 %
FEV1FVC-%Pred-Pre: 109 %
FEV6-%Change-Post: -3 %
FEV6-%Pred-Post: 91 %
FEV6-%Pred-Pre: 94 %
FEV6-Post: 2.81 L
FEV6-Pre: 2.9 L
FEV6FVC-%Change-Post: 0 %
FEV6FVC-%Pred-Post: 106 %
FEV6FVC-%Pred-Pre: 107 %
FVC-%Change-Post: -1 %
FVC-%Pred-Post: 85 %
FVC-%Pred-Pre: 87 %
FVC-Post: 2.86 L
FVC-Pre: 2.92 L
Post FEV1/FVC ratio: 81 %
Post FEV6/FVC ratio: 99 %
Pre FEV1/FVC ratio: 78 %
Pre FEV6/FVC Ratio: 99 %
RV % pred: 88 %
RV: 2.17 L
TLC % pred: 84 %
TLC: 5.3 L

## 2022-03-31 MED ORDER — ALBUTEROL SULFATE (2.5 MG/3ML) 0.083% IN NEBU
2.5000 mg | INHALATION_SOLUTION | Freq: Once | RESPIRATORY_TRACT | Status: AC
  Administered 2022-03-31: 2.5 mg via RESPIRATORY_TRACT

## 2022-03-31 MED ORDER — FUROSEMIDE 20 MG PO TABS: ORAL_TABLET | ORAL | 3 refills | Status: DC

## 2022-04-11 ENCOUNTER — Other Ambulatory Visit: Payer: Self-pay | Admitting: Family Medicine

## 2022-04-14 NOTE — Telephone Encounter (Signed)
The indapamide needs to be canceled.  Please inform pharmacy.  The other medicines may be refilled.

## 2022-04-15 ENCOUNTER — Ambulatory Visit (INDEPENDENT_AMBULATORY_CARE_PROVIDER_SITE_OTHER): Payer: PPO | Admitting: Family Medicine

## 2022-04-15 VITALS — BP 138/78 | HR 68 | Temp 97.9°F | Ht 66.0 in | Wt 195.0 lb

## 2022-04-15 DIAGNOSIS — M549 Dorsalgia, unspecified: Secondary | ICD-10-CM

## 2022-04-15 DIAGNOSIS — G8929 Other chronic pain: Secondary | ICD-10-CM | POA: Diagnosis not present

## 2022-04-15 DIAGNOSIS — I480 Paroxysmal atrial fibrillation: Secondary | ICD-10-CM | POA: Diagnosis not present

## 2022-04-15 DIAGNOSIS — R5383 Other fatigue: Secondary | ICD-10-CM | POA: Diagnosis not present

## 2022-04-15 DIAGNOSIS — I10 Essential (primary) hypertension: Secondary | ICD-10-CM

## 2022-04-15 MED ORDER — OXYCODONE HCL 10 MG PO TABS: ORAL_TABLET | ORAL | 0 refills | Status: DC

## 2022-04-15 MED ORDER — OXYCODONE HCL 10 MG PO TABS: 10.0000 mg | ORAL_TABLET | Freq: Four times a day (QID) | ORAL | 0 refills | Status: DC | PRN

## 2022-04-15 MED ORDER — INDAPAMIDE 1.25 MG PO TABS: 1.2500 mg | ORAL_TABLET | Freq: Every day | ORAL | 5 refills | Status: DC

## 2022-04-15 NOTE — Progress Notes (Signed)
Subjective:    Patient ID: [name], male    DOB: 08-06-1940, 81 y.o.   MRN: [id number]  HPI 3 month follow pain medication , breathing problem follow up - breathing test done  Has not taken fluid furosemide- pill for 5 days - causes extreme freq urination - gaining weight  This patient was seen today for chronic pain  The medication list was reviewed and updated.  Location of Pain for which the patient has been treated with regarding narcotics: Mid thoracic and lumbar spine  Onset of this pain: Present for years, has had previous surgeries, failed treatment with just anti-inflammatories, now that he is on anticoagulants cannot 40s, has been on pain medicine for years   -Compliance with medication: Good compliance with medicine  - Number patient states they take daily: Takes anywhere from 3 or 4/day  -when was the last dose patient took?  Earlier today  The patient was advised the importance of maintaining medication and not using illegal substances with these.  Here for refills and follow up  The patient was educated that we can provide 3 monthly scripts for their medication, it is their responsibility to follow the instructions.  Side effects or complications from medications: Denies side effects  Patient is aware that pain medications are meant to minimize the severity of the pain to allow their pain levels to improve to allow for better function. They are aware of that pain medications cannot totally remove their pain.  Due for UDT ( at least once per year) : August 2023  Scale of 1 to 10 ( 1 is least 10 is most) Your pain level without the medicine: 8 Your pain level with medication 4  Scale 1 to 10 ( 1-helps very little, 10 helps very well) How well does your pain medication reduce your pain so you can function better through out the day? 7  Quality of the pain: Throbbing aching  Persistence of the pain: Present all the time  Modifying factors: Worse with  activity      Review of Systems     Objective:   Physical Exam General-in no acute distress Eyes-no discharge Lungs-respiratory rate normal, CTA CV-no murmurs,RRR Extremities skin warm dry no edema Neuro grossly normal Behavior normal, alert        Assessment & Plan:  1. Primary hypertension Blood pressure under good control Unfortunately he has a lot of increased urination with Lasix does not tolerate it would like to go back into my We will utilize an combined lower dose 1.25 Recheck kidney function within 7 to 10 days Continue other medicines - Basic Metabolic Panel  2. HYPERTENSION, BENIGN See above  3. Paroxysmal atrial fibrillation (HCC) He is concerned that his heart is causing him to have fatigue and tiredness his pulmonary function test overall was reasonable.  As for his heart test there is no obvious decline with any notable ischemia and no reduction in ejection fraction  4. Chronic back pain greater than 3 months duration The patient was seen in followup for chronic pain. A review over at their current pain status was discussed. Drug registry was checked. Prescriptions were given.  Regular follow-up recommended. Discussion was held regarding the importance of compliance with medication as well as pain medication contract.  Patient was informed that medication may cause drowsiness and should not be combined  with other medications/alcohol or street drugs. If the patient feels medication is causing altered alertness then do not drive or operate dangerous  equipment.  Should be noted that the patient appears to be meeting appropriate use of opioids and response.  Evidenced by improved function and decent pain control without significant side effects and no evidence of overt aberrancy issues.  Upon discussion with the patient today they understand that opioid therapy is optional and they feel that the pain has been refractory to reasonable conservative measures and  is significant and affecting quality of life enough to warrant ongoing therapy and wishes to continue opioids.  Refills were provided. We will send in 3 scripts today  5. Other fatigue We will need to monitor this closely if it gets worse we will need to do more comprehensive evaluation he has had CBC previously which was good but we can do further testing if he continues to have further decline follow-up within 3 months

## 2022-04-16 ENCOUNTER — Ambulatory Visit (INDEPENDENT_AMBULATORY_CARE_PROVIDER_SITE_OTHER): Payer: PPO | Admitting: Urology

## 2022-04-16 VITALS — BP 159/72 | HR 74

## 2022-04-16 DIAGNOSIS — N401 Enlarged prostate with lower urinary tract symptoms: Secondary | ICD-10-CM

## 2022-04-16 DIAGNOSIS — N138 Other obstructive and reflux uropathy: Secondary | ICD-10-CM

## 2022-04-16 DIAGNOSIS — R3915 Urgency of urination: Secondary | ICD-10-CM

## 2022-04-16 DIAGNOSIS — R351 Nocturia: Secondary | ICD-10-CM

## 2022-04-16 DIAGNOSIS — R350 Frequency of micturition: Secondary | ICD-10-CM

## 2022-04-16 DIAGNOSIS — N3941 Urge incontinence: Secondary | ICD-10-CM | POA: Diagnosis not present

## 2022-04-16 LAB — BLADDER SCAN AMB NON-IMAGING: Scan Result: 92

## 2022-04-16 MED ORDER — MIRABEGRON ER 50 MG PO TB24: 50.0000 mg | ORAL_TABLET | Freq: Every day | ORAL | 11 refills | Status: DC

## 2022-04-16 NOTE — Progress Notes (Signed)
Subjective: 1. Benign prostatic hyperplasia with urinary frequency   2. BPH with urinary obstruction   3. Urgency of urination   4. Urge incontinence   5. Nocturia      04/16/22: [name] returns today in f/u. He has BPH with OAB and UUI.  He responded to [facility] but it was too expensive.  He is on tamsulosin which has helped but he still has UUI.  HIs UA is clear and his PVR is 66m.   11/14/20:[name] Bondreturns today in f/u to assess his response to Myrbetriq.   He thinks he is doing better with reduced frequency and urgency.  He has reduced UUI as well.   His PVR is stable at 68m   He has had some constipation and mild headache just today.  He had a CT last week and was found to have diverticulitis.  He is on Cipro and Metronidazole.  He has had side effects with the antibiotics.    [name] today in f/u.  He was last seen in 2018 for BPH with BOO with urethral bleeding that was associated with urethral distention.  He had been on finasteride but has been off for a while.  He currently reports urgency and has UUI.  He has nocturia x 2.  His last PSA was stable at 2.0 in 1/19.  Dr. LuWolfgang Phoenixrdered one that was drawn today.  His PVR is 5629m  He has had some recent constipation that is associated with chronic opioid for low back pain.  He had a CT stone study in 5/21 that showed no stones.    IPSS     Row Name 04/16/22 1500         International Prostate Symptom Score   How often have you had the sensation of not emptying your bladder? Not at All     How often have you had to urinate less than every two hours? Almost always     How often have you found you stopped and started again several times when you urinated? Less than half the time     How often have you found it difficult to postpone urination? Almost always     How often have you had a weak urinary stream? Less than half the time     How often have you had to strain to start urination? Not at All     How many times did  you typically get up at night to urinate? 4 Times     Total IPSS Score 18       Quality of Life due to urinary symptoms   If you were to spend the rest of your life with your urinary condition just the way it is now how would you feel about that? Terrible              ROS:  Review of Systems  HENT:  Positive for congestion.   Respiratory:  Positive for shortness of breath.   Cardiovascular:  Positive for leg swelling.  Neurological:  Positive for weakness.  Endo/Heme/Allergies:  Bruises/bleeds easily.    Allergies  Allergen Reactions   Sectral [Acebutolol Hcl] Other (See Comments)    Unknown reaction   Sulfonamide Derivatives Other (See Comments)    Runs blood pressure up   Zocor [Simvastatin] Other (See Comments)    Joint pain    Past Medical History:  Diagnosis Date   Arthritis    Cancer ([place]  skin   Carpal  tunnel syndrome    Cataract    left eye   Chronic back pain    multiple back surgeries   Complication of anesthesia    pt states woke up during hemorroid surgery   DVT (deep venous thrombosis) ([place]) 03/2007   left leg    Dysrhythmia    GERD (gastroesophageal reflux disease)    takes Protonix daily   Glaucoma    History of colon polyps    History of hyperglycemia    History of MRSA infection 1992   HTN (hypertension)    takes  Ramipril daily   Hyperlipidemia    takes Pravastatin 3 times a week   Nocturia    Paroxysmal atrial fibrillation (HCC)    takes Pradaxa daily, Chads2vasc score 2(07/11/14)   Pneumonia    many yrs ago   Pulmonary embolism ([place]) 2008    Past Surgical History:  Procedure Laterality Date   APPLICATION OF ROBOTIC ASSISTANCE FOR SPINAL PROCEDURE  05/06/2021   Procedure: APPLICATION OF ROBOTIC ASSISTANCE FOR SPINAL PROCEDURE;  Surgeon: Kristeen Miss, MD;  Location: [place];  Service: Neurosurgery;;   BACK SURGERY     BIOPSY  04/01/2018   Procedure: BIOPSY;  Surgeon: Rogene Houston, MD;  [facility];  Service:  Endoscopy;;  gastric   CARDIAC CATHETERIZATION  05/18/2006   CARPAL TUNNEL RELEASE Right    cataract surgery Right    cervical and lumbar surgery     COLONOSCOPY  04/24/2011   Procedure: COLONOSCOPY;  Surgeon: Rogene Houston, MD;  [facility];  Service: Endoscopy;  Laterality: N/A;  1200   COLONOSCOPY N/A 08/13/2016   Procedure: COLONOSCOPY;  Surgeon: Rogene Houston, MD;  [facility];  Service: Endoscopy;  Laterality: N/A;  1200   ESOPHAGOGASTRODUODENOSCOPY  03/10/2012   Procedure: ESOPHAGOGASTRODUODENOSCOPY (EGD);  Surgeon: Rogene Houston, MD;  [facility];  Service: Endoscopy;  Laterality: N/A;  325   ESOPHAGOGASTRODUODENOSCOPY (EGD) WITH PROPOFOL N/A 04/01/2018   Procedure: ESOPHAGOGASTRODUODENOSCOPY (EGD) WITH PROPOFOL;  Surgeon: Rogene Houston, MD;  [facility];  Service: Endoscopy;  Laterality: N/A;   EYE SURGERY     HARDWARE REMOVAL N/A 04/24/2014   Procedure: REMOVAL OF SACRAL INSTRUMENTATION WITH [place];  Surgeon: Kristeen Miss, MD;  Location: [place] [facility];  Service: Neurosurgery;  Laterality: N/A;   HEMORRHOIDECTOMY WITH HEMORRHOID BANDING     JOINT REPLACEMENT     KNEE ARTHROSCOPY Right    KNEE ARTHROSCOPY WITH MEDIAL MENISECTOMY Right 08/03/2018   Procedure: RIGHT KNEE ARTHROSCOPY WITH PARTIAL MEDIAL MENISCECTOMY;  Surgeon: Leandrew Koyanagi, MD;  Location: [place];  Service: Orthopedics;  Laterality: Right;   KNEE SURGERY     left   LASIK     POLYPECTOMY  08/13/2016   Procedure: POLYPECTOMY;  Surgeon: Rogene Houston, MD;  [facility];  Service: Endoscopy;;  colon   PVI  01/24/2010   afib ablation   TRANSFORAMINAL LUMBAR INTERBODY FUSION (TLIF) WITH PEDICLE SCREW FIXATION 1 LEVEL N/A 05/06/2021   Procedure: Transforaminal Lumbar Interbody Fusion  - Thoracic twelve-Lumbar one fixation Thoracic nine to Lumbar one with methacrylate augmentation with mazor;  Surgeon: Kristeen Miss, MD;  Location: [place];  Service: Neurosurgery;  Laterality: N/A;    Social History   Socioeconomic History   Marital status: Married    Spouse name: Not on file   Number of children: Not on file   Years of education: Not on file  Highest education level: Not on file  Occupational History   Occupation: retired  Tobacco Use   Smoking status: Former    Packs/day: 0.25    Years: 3.00    Total pack years: 0.75    Types: Cigarettes    Quit date: 03/30/1969    Years since quitting: 53.0    Passive exposure: Past   Smokeless tobacco: Never  Vaping Use   Vaping Use: Never used  Substance and Sexual Activity   Alcohol use: Yes    Alcohol/week: 14.0 standard drinks of alcohol    Types: 7 Glasses of wine, 7 Shots of liquor per week    Comment: Jack nightly   Drug use: No   Sexual activity: Yes  Other Topics Concern   Not on file  Social History Narrative   Not on file   Social Determinants of Health   Financial Resource Strain: Low Risk  (11/12/2020)   Overall Financial Resource Strain (CARDIA)    Difficulty of Paying Living Expenses: Not hard at all  Food Insecurity: No Food Insecurity (11/25/2021)   Hunger Vital Sign    Worried About Running Out of Food in the Last Year: Never true    Ran Out of Food in the Last Year: Never true  Transportation Needs: No Transportation Needs (11/25/2021)   PRAPARE - Hydrologist (Medical): No    Lack of Transportation (Non-Medical): No  Physical Activity: Sufficiently Active (11/25/2021)   Exercise Vital Sign    Days of Exercise per Week: 5 days    Minutes of Exercise per Session: 30 min  Stress: No Stress Concern Present (11/25/2021)   [place]    Feeling of Stress : Not at all  Social Connections: [place] (11/25/2021)   Social Connection and Isolation Panel [NHANES]    Frequency of Communication with Friends and Family: More than three times a week     Frequency of Social Gatherings with Friends and Family: More than three times a week    Attends Religious Services: More than 4 times per year    Active Member of [employer] or Organizations: Yes    Attends Music therapist: More than 4 times per year    Marital Status: Married  Human resources officer Violence: Not At Risk (11/25/2021)   Humiliation, Afraid, Rape, and Kick questionnaire    Fear of Current or Ex-Partner: No    Emotionally Abused: No    Physically Abused: No    Sexually Abused: No    Family History  Problem Relation Age of Onset   Hypertension Mother    Transient ischemic attack Mother    Heart disease Father    Diabetes Brother    Healthy Daughter    Colon cancer Sister    Hypertension Other    Heart disease Other     Anti-infectives: Anti-infectives (From admission, onward)    None       Current Outpatient Medications  Medication Sig Dispense Refill   amLODipine (NORVASC) 10 MG tablet TAKE 1 TABLET BY MOUTH EVERY DAY 90 tablet 1   ELIQUIS 5 MG TABS tablet TAKE 1 TABLET BY MOUTH TWICE A DAY. STOP PRADAXA 60 tablet 5   indapamide (LOZOL) 1.25 MG tablet Take 1 tablet (1.25 mg total) by mouth daily. 30 tablet 5   ketorolac (ACULAR) 0.5 % ophthalmic solution Place 1 drop into the left eye 2 (two) times daily.     metoprolol succinate (  TOPROL-XL) 25 MG 24 hr tablet Take 1 tablet (25 mg total) by mouth 2 (two) times daily. 180 tablet 2   mirabegron ER (MYRBETRIQ) 50 MG TB24 tablet Take 1 tablet (50 mg total) by mouth daily. 30 tablet 11   Oxycodone HCl 10 MG TABS Take 1 tablet (10 mg total) by mouth 4 (four) times daily as needed. 120 tablet 0   pantoprazole (PROTONIX) 40 MG tablet Take 1 tablet (40 mg total) by mouth daily before breakfast. 90 tablet 1   pravastatin (PRAVACHOL) 80 MG tablet TAKE 1 TABLET BY MOUTH EVERYDAY AT BEDTIME 90 tablet 1   psyllium (METAMUCIL SMOOTH TEXTURE) 58.6 % powder Take 1 packet by mouth at bedtime.     ramipril (ALTACE) 10  MG capsule TAKE 1 CAPSULE BY MOUTH TWICE A DAY 180 capsule 1   tamsulosin (FLOMAX) 0.4 MG CAPS capsule Take 1 capsule (0.4 mg total) by mouth daily. 30 capsule 3   torsemide (DEMADEX) 10 MG tablet Take 1 tablet (10 mg total) by mouth daily. 30 tablet 5   No current facility-administered medications for this visit.     Objective: Vital signs in last 24 hours: BP (!) 159/72   Pulse 74   Intake/Output from previous day: No intake/output data recorded. Intake/Output this shift: '@IOTHISSHIFT'$ @   Physical Exam Genitourinary:    Comments: He had a rectal exam this morning by Dr. Wolfgang Phoenix.  It was reported as normal but the note isn't finished yet.     Lab Results:  Results for orders placed or performed in visit on 04/16/22 (from the past 24 hour(s))  Urinalysis, Routine w reflex microscopic     Status: None   Collection Time: 04/16/22  3:53 PM  Result Value Ref Range   Specific Gravity, UA 1.015 1.005 - 1.030   pH, UA 6.0 5.0 - 7.5   Color, UA Yellow Yellow   Appearance Ur Clear Clear   Leukocytes,UA Negative Negative   Protein,UA Trace Negative/Trace   Glucose, UA Negative Negative   Ketones, UA Negative Negative   RBC, UA Negative Negative   Bilirubin, UA Negative Negative   Urobilinogen, Ur 1.0 0.2 - 1.0 mg/dL   Nitrite, UA Negative Negative   Microscopic Examination Comment    Narrative   Performed at:  [facility] [address] [place], [place]  [id number] Lab Director: [place], Phone:  [phone]     BMET No results for input(s): "NA", "K", "CL", "CO2", "GLUCOSE", "BUN", "CREATININE", "CALCIUM" in the last 72 hours.  PT/INR No results for input(s): "LABPROT", "INR" in the last 72 hours. ABG No results for input(s): "PHART", "HCO3" in the last 72 hours.  Invalid input(s): "PCO2", "PO2"  Studies/Results: CT stone study films and report reviewed.    Assessment/Plan: OAB wet with a history of BPH with BOO but minimal obstructive  symptoms on tamsulosin.   He did better with h the '50mg'$  Myrbetriq so I will give him additional samples and send a script.  Hopefully his coverage will be better this year.  He will return in 4-6 weeks.   Meds ordered this encounter  Medications   mirabegron ER (MYRBETRIQ) 50 MG TB24 tablet    Sig: Take 1 tablet (50 mg total) by mouth daily.    Dispense:  30 tablet    Refill:  11     Orders Placed This Encounter  Procedures   Urinalysis, Routine w reflex microscopic   Bladder scan   BLADDER SCAN AMB NON-IMAGING  Return in about 4 weeks (around 05/14/2022) for with PVR.    CC: Dr. [name].      Irine Seal 04/17/2022 [id number] ID: [name], male   DOB: 1940-12-14, 81 y.o.   MRN: [id number]

## 2022-04-17 ENCOUNTER — Encounter: Payer: Self-pay | Admitting: Urology

## 2022-04-17 LAB — URINALYSIS, ROUTINE W REFLEX MICROSCOPIC
Bilirubin, UA: NEGATIVE
Glucose, UA: NEGATIVE
Ketones, UA: NEGATIVE
Leukocytes,UA: NEGATIVE
Nitrite, UA: NEGATIVE
RBC, UA: NEGATIVE
Specific Gravity, UA: 1.015 (ref 1.005–1.030)
Urobilinogen, Ur: 1 mg/dL (ref 0.2–1.0)
pH, UA: 6 (ref 5.0–7.5)

## 2022-04-21 ENCOUNTER — Ambulatory Visit
Admission: EM | Admit: 2022-04-21 | Discharge: 2022-04-21 | Disposition: A | Discharge status: Home / Self Care | Payer: PPO | Attending: Nurse Practitioner | Admitting: Nurse Practitioner

## 2022-04-21 DIAGNOSIS — R059 Cough, unspecified: Secondary | ICD-10-CM | POA: Diagnosis not present

## 2022-04-21 DIAGNOSIS — Z1152 Encounter for screening for COVID-19: Secondary | ICD-10-CM | POA: Insufficient documentation

## 2022-04-21 DIAGNOSIS — J069 Acute upper respiratory infection, unspecified: Secondary | ICD-10-CM | POA: Insufficient documentation

## 2022-04-21 MED ORDER — BENZONATATE 100 MG PO CAPS: 100.0000 mg | ORAL_CAPSULE | Freq: Three times a day (TID) | ORAL | 0 refills | Status: AC | PRN

## 2022-04-21 NOTE — Discharge Instructions (Signed)
COVID test is pending. You will be contacted if the results are positive. As discussed, you are a candidate to receive molnupiravir.  Take medication as prescribed. Increase fluids and allow for plenty of rest. Recommend Tylenol as needed for pain, fever, or general discomfort. Recommend using a humidifier at bedtime during sleep to help with cough and nasal congestion. Sleep elevated on 2 pillows while symptoms persist. Follow-up with your primary care physician if symptoms fail to improve.

## 2022-04-21 NOTE — ED Provider Notes (Signed)
[facility]    CSN: [id number] Arrival date & time: 04/21/22  1013      History   Chief Complaint Chief Complaint  Patient presents with   Cough    HPI [name] is a 81 y.o. male.   The history is provided by the patient.   Patient presents for complaints of cough that started over the past 24 hours.  Patient denies fever, chills, headache, sore throat, runny nose, wheezing, shortness of breath, difficulty breathing, or GI symptoms.  Patient states he has had some nasal congestion.  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  He denies any obvious known sick contacts.  Reports he has been vaccinated for both COVID and flu.  Reports he has not taken any medication for his symptoms.  Past Medical History:  Diagnosis Date   Arthritis    Cancer ([place])    skin   Carpal tunnel syndrome    Cataract    left eye   Chronic back pain    multiple back surgeries   Complication of anesthesia    pt states woke up during hemorroid surgery   DVT (deep venous thrombosis) ([place]) 03/2007   left leg    Dysrhythmia    GERD (gastroesophageal reflux disease)    takes Protonix daily   Glaucoma    History of colon polyps    History of hyperglycemia    History of MRSA infection 1992   HTN (hypertension)    takes  Ramipril daily   Hyperlipidemia    takes Pravastatin 3 times a week   Nocturia    Paroxysmal atrial fibrillation (HCC)    takes Pradaxa daily, Chads2vasc score 2(07/11/14)   Pneumonia    many yrs ago   Pulmonary embolism ([place]) 2008    Patient Active Problem List   Diagnosis Date Noted   Pain of neck with recent traumatic injury 07/11/2021   Lumbar stenosis with neurogenic claudication 05/06/2021   Constipation    S/P right knee arthroscopy 08/10/2018   Acute tear lateral meniscus, right, initial encounter 08/03/2018   Acute medial meniscus tear, right, subsequent encounter 07/13/2018   Closed fracture of lateral portion of left  tibial plateau 03/09/2018   Aortic atherosclerosis ([place]) 11/05/2016   History of colonic polyps 06/16/2016   Pseudoarthrosis of lumbar spine 04/24/2014   Hx of adenomatous colonic polyps 11/22/2013   Prediabetes 10/12/2013   Snoring 12/21/2012   Chronic back pain greater than 3 months duration 10/31/2012   Gastroesophageal reflux disease 02/22/2012   Hyperlipidemia 01/21/2012   HYPERTENSION, BENIGN 12/06/2009   ATRIAL FIBRILLATION, PAROXYSMAL 12/06/2009    Past Surgical History:  Procedure Laterality Date   APPLICATION OF ROBOTIC ASSISTANCE FOR SPINAL PROCEDURE  05/06/2021   Procedure: APPLICATION OF ROBOTIC ASSISTANCE FOR SPINAL PROCEDURE;  Surgeon: Kristeen Miss, MD;  Location: [place];  Service: Neurosurgery;;   BACK SURGERY     BIOPSY  04/01/2018   Procedure: BIOPSY;  Surgeon: Rogene Houston, MD;  [facility];  Service: Endoscopy;;  gastric   CARDIAC CATHETERIZATION  05/18/2006   CARPAL TUNNEL RELEASE Right    cataract surgery Right    cervical and lumbar surgery     COLONOSCOPY  04/24/2011   Procedure: COLONOSCOPY;  Surgeon: Rogene Houston, MD;  [facility];  Service: Endoscopy;  Laterality: N/A;  1200   COLONOSCOPY N/A 08/13/2016   Procedure: COLONOSCOPY;  Surgeon: Rogene Houston, MD;  [facility];  Service: Endoscopy;  Laterality: N/A;  1200   ESOPHAGOGASTRODUODENOSCOPY  03/10/2012   Procedure: ESOPHAGOGASTRODUODENOSCOPY (EGD);  Surgeon: Rogene Houston, MD;  [facility];  Service: Endoscopy;  Laterality: N/A;  325   ESOPHAGOGASTRODUODENOSCOPY (EGD) WITH PROPOFOL N/A 04/01/2018   Procedure: ESOPHAGOGASTRODUODENOSCOPY (EGD) WITH PROPOFOL;  Surgeon: Rogene Houston, MD;  [facility];  Service: Endoscopy;  Laterality: N/A;   EYE SURGERY     HARDWARE REMOVAL N/A 04/24/2014   Procedure: REMOVAL OF SACRAL INSTRUMENTATION WITH [place];  Surgeon: Kristeen Miss, MD;  Location: [place] [facility];  Service: Neurosurgery;   Laterality: N/A;   HEMORRHOIDECTOMY WITH HEMORRHOID BANDING     JOINT REPLACEMENT     KNEE ARTHROSCOPY Right    KNEE ARTHROSCOPY WITH MEDIAL MENISECTOMY Right 08/03/2018   Procedure: RIGHT KNEE ARTHROSCOPY WITH PARTIAL MEDIAL MENISCECTOMY;  Surgeon: Leandrew Koyanagi, MD;  Location: [place];  Service: Orthopedics;  Laterality: Right;   KNEE SURGERY     left   LASIK     POLYPECTOMY  08/13/2016   Procedure: POLYPECTOMY;  Surgeon: Rogene Houston, MD;  [facility];  Service: Endoscopy;;  colon   PVI  01/24/2010   afib ablation   TRANSFORAMINAL LUMBAR INTERBODY FUSION (TLIF) WITH PEDICLE SCREW FIXATION 1 LEVEL N/A 05/06/2021   Procedure: Transforaminal Lumbar Interbody Fusion  - Thoracic twelve-Lumbar one fixation Thoracic nine to Lumbar one with methacrylate augmentation with mazor;  Surgeon: Kristeen Miss, MD;  Location: [place];  Service: Neurosurgery;  Laterality: N/A;       Home Medications    Prior to Admission medications   Medication Sig Start Date End Date Taking? Authorizing Provider  benzonatate (TESSALON PERLES) 100 MG capsule Take 1 capsule (100 mg total) by mouth 3 (three) times daily as needed for up to 10 days for cough. 04/21/22 05/01/22 Yes Bethsaida Siegenthaler-Warren, Alda Lea, NP  amLODipine (NORVASC) 10 MG tablet TAKE 1 TABLET BY MOUTH EVERY DAY 04/15/22   Luking, Elayne Snare, MD  ELIQUIS 5 MG TABS tablet TAKE 1 TABLET BY MOUTH TWICE A DAY. STOP PRADAXA 03/24/22   Kathyrn Drown, MD  indapamide (LOZOL) 1.25 MG tablet Take 1 tablet (1.25 mg total) by mouth daily. 04/15/22   Kathyrn Drown, MD  ketorolac (ACULAR) 0.5 % ophthalmic solution Place 1 drop into the left eye 2 (two) times daily. 11/20/21   [provider]  metoprolol succinate (TOPROL-XL) 25 MG 24 hr tablet Take 1 tablet (25 mg total) by mouth 2 (two) times daily. 12/24/21 12/24/22  Sherran Needs, NP  mirabegron ER (MYRBETRIQ) 50 MG TB24 tablet Take 1 tablet (50 mg total) by mouth daily. 04/16/22    Irine Seal, MD  Oxycodone HCl 10 MG TABS Take 1 tablet (10 mg total) by mouth 4 (four) times daily as needed. 04/15/22   Kathyrn Drown, MD  pantoprazole (PROTONIX) 40 MG tablet Take 1 tablet (40 mg total) by mouth daily before breakfast. 10/09/21   Kathyrn Drown, MD  pravastatin (PRAVACHOL) 80 MG tablet TAKE 1 TABLET BY MOUTH EVERYDAY AT BEDTIME 10/06/21   Luking, Scott A, MD  psyllium (METAMUCIL SMOOTH TEXTURE) 58.6 % powder Take 1 packet by mouth at bedtime. 01/14/21   Rehman, Mechele Dawley, MD  ramipril (ALTACE) 10 MG capsule TAKE 1 CAPSULE BY MOUTH TWICE A DAY 04/15/22   Kathyrn Drown, MD  tamsulosin (FLOMAX) 0.4 MG CAPS capsule Take 1 capsule (0.4 mg total) by mouth daily.  03/17/22   Kathyrn Drown, MD  torsemide (DEMADEX) 10 MG tablet Take 1 tablet (10 mg total) by mouth daily. 02/26/22   Kathyrn Drown, MD    Family History Family History  Problem Relation Age of Onset   Hypertension Mother    Transient ischemic attack Mother    Heart disease Father    Diabetes Brother    Healthy Daughter    Colon cancer Sister    Hypertension Other    Heart disease Other     Social History Social History   Tobacco Use   Smoking status: Former    Packs/day: 0.25    Years: 3.00    Total pack years: 0.75    Types: Cigarettes    Quit date: 03/30/1969    Years since quitting: 53.0    Passive exposure: Past   Smokeless tobacco: Never  Vaping Use   Vaping Use: Never used  Substance Use Topics   Alcohol use: Yes    Alcohol/week: 14.0 standard drinks of alcohol    Types: 7 Glasses of wine, 7 Shots of liquor per week    Comment: Jack nightly   Drug use: No     Allergies   Sectral [acebutolol hcl], Sulfonamide derivatives, and Zocor [simvastatin]   Review of Systems Review of Systems Per HPI  Physical Exam Triage Vital Signs ED Triage Vitals [04/21/22 1132]  Enc Vitals Group     BP (!) 162/77     Pulse Rate 75     Resp 18     Temp 98.3 F (36.8 C)     Temp Source Oral      SpO2 92 %     Weight      Height      Head Circumference      Peak Flow      Pain Score 0     Pain Loc      Pain Edu?      Excl. in [place]?    No data found.  Updated Vital Signs BP (!) 162/77 (BP Location: Right Arm)   Pulse 75   Temp 98.3 F (36.8 C) (Oral)   Resp 18   SpO2 92%   Visual Acuity Right Eye Distance:   Left Eye Distance:   Bilateral Distance:    Right Eye Near:   Left Eye Near:    Bilateral Near:     Physical Exam Vitals and nursing note reviewed.  Constitutional:      General: He is not in acute distress.    Appearance: Normal appearance.  HENT:     Head: Normocephalic.     Right Ear: Tympanic membrane, ear canal and external ear normal.     Left Ear: Tympanic membrane, ear canal and external ear normal.     Nose: Nose normal.     Right Turbinates: Enlarged and swollen.     Left Turbinates: Enlarged and swollen.     Right Sinus: No maxillary sinus tenderness or frontal sinus tenderness.     Left Sinus: No maxillary sinus tenderness or frontal sinus tenderness.     Mouth/Throat:     Lips: Pink.     Mouth: Mucous membranes are moist.     Pharynx: Uvula midline. No posterior oropharyngeal erythema or uvula swelling.  Eyes:     Extraocular Movements: Extraocular movements intact.     Conjunctiva/sclera: Conjunctivae normal.     Pupils: Pupils are equal, round, and reactive to light.  Cardiovascular:     Rate  and Rhythm: Normal rate and regular rhythm.     Pulses: Normal pulses.     Heart sounds: Normal heart sounds.  Pulmonary:     Effort: Pulmonary effort is normal. No respiratory distress.     Breath sounds: Normal breath sounds. No stridor. No wheezing, rhonchi or rales.  Abdominal:     General: Bowel sounds are normal.     Palpations: Abdomen is soft.     Tenderness: There is no abdominal tenderness.  Musculoskeletal:     Cervical back: Normal range of motion.  Lymphadenopathy:     Cervical: No cervical adenopathy.  Skin:    General:  Skin is warm and dry.  Neurological:     General: No focal deficit present.     Mental Status: He is alert and oriented to person, place, and time.  Psychiatric:        Mood and Affect: Mood normal.        Behavior: Behavior normal.      UC Treatments / Results  Labs (all labs ordered are listed, but only abnormal results are displayed) Labs Reviewed  SARS CORONAVIRUS 2 (TAT 6-24 HRS)    EKG   Radiology No results found.  Procedures Procedures (including critical care time)  Medications Ordered in UC Medications - No data to display  Initial Impression / Assessment and Plan / UC Course  I have reviewed the triage vital signs and the nursing notes.  Pertinent labs & imaging results that were available during my care of the patient were reviewed by me and considered in my medical decision making (see chart for details).  COVID test is pending.  The patient is well-appearing, he is in no acute distress, he is mildly hypertensive, but vital signs are otherwise stable.  Symptoms are consistent with acute cough, most likely in the beginning stages of a viral upper respiratory infection.  Patient was prescribed benzonatate 100 mg for his cough.  Supportive care recommendations were provided to the patient.  Patient is a candidate to receive molnupiravir if his COVID test is positive.  Patient verbalizes understanding.  All questions were answered.  Patient is stable for discharge.  Final Clinical Impressions(s) / UC Diagnoses   Final diagnoses:  Cough, unspecified type  Viral upper respiratory infection     Discharge Instructions      COVID test is pending. You will be contacted if the results are positive. As discussed, you are a candidate to receive molnupiravir.  Take medication as prescribed. Increase fluids and allow for plenty of rest. Recommend Tylenol as needed for pain, fever, or general discomfort. Recommend using a humidifier at bedtime during sleep to help  with cough and nasal congestion. Sleep elevated on 2 pillows while symptoms persist. Follow-up with your primary care physician if symptoms fail to improve.      ED Prescriptions     Medication Sig Dispense Auth. Provider   benzonatate (TESSALON PERLES) 100 MG capsule Take 1 capsule (100 mg total) by mouth 3 (three) times daily as needed for up to 10 days for cough. 30 capsule Fiana Gladu-Warren, Alda Lea, NP      PDMP not reviewed this encounter.   Tish Men, NP 04/21/22 1205

## 2022-04-21 NOTE — ED Triage Notes (Signed)
Pt reports cough x 1 day. Ha snot taken any meds for complaint.

## 2022-04-22 LAB — SARS CORONAVIRUS 2 (TAT 6-24 HRS): SARS Coronavirus 2: NEGATIVE

## 2022-04-27 ENCOUNTER — Ambulatory Visit (INDEPENDENT_AMBULATORY_CARE_PROVIDER_SITE_OTHER): Payer: PPO | Admitting: Family Medicine

## 2022-04-27 VITALS — BP 128/82 | HR 82 | Temp 98.0°F | Ht 66.0 in | Wt 195.0 lb

## 2022-04-27 DIAGNOSIS — J019 Acute sinusitis, unspecified: Secondary | ICD-10-CM | POA: Diagnosis not present

## 2022-04-27 MED ORDER — AMOXICILLIN-POT CLAVULANATE 875-125 MG PO TABS: 1.0000 | ORAL_TABLET | Freq: Two times a day (BID) | ORAL | 0 refills | Status: DC

## 2022-04-27 MED ORDER — IPRATROPIUM BROMIDE 0.06 % NA SOLN: 2.0000 | Freq: Four times a day (QID) | NASAL | 0 refills | Status: DC | PRN

## 2022-04-27 NOTE — Assessment & Plan Note (Signed)
Treating with Augmentin. Atrovent as prescribed.

## 2022-04-27 NOTE — Progress Notes (Signed)
Subjective:  Patient ID: [name], male    DOB: 30-May-1940  Age: 81 y.o. MRN: [id number]  CC: Chief Complaint  Patient presents with   Cough    Congestion continues- seen in urgent care 04/21/22 and given antibiotic but not helping - covid test negative at urgent care    HPI:  81 year old male presents for evaluation of the above.  Patient states he has been sick for 2 weeks.  Recently seen at urgent care on 12/5.  This was thought to be viral.  He was sent home on [facility].  Patient reports that his symptoms continue to persist and are quite severe.  He reports severe nasal congestion as well as sinus congestion and pressure.  He has had no relief in his symptoms.  No fever.  He states that mucus from his nose is bloody.  No other associated symptoms.  No other complaints.  Patient Active Problem List   Diagnosis Date Noted   Acute rhinosinusitis 04/27/2022   Lumbar stenosis with neurogenic claudication 05/06/2021   Constipation    S/P right knee arthroscopy 08/10/2018   Aortic atherosclerosis ([place]) 11/05/2016   History of colonic polyps 06/16/2016   Hx of adenomatous colonic polyps 11/22/2013   Prediabetes 10/12/2013   Chronic back pain greater than 3 months duration 10/31/2012   Gastroesophageal reflux disease 02/22/2012   Hyperlipidemia 01/21/2012   HYPERTENSION, BENIGN 12/06/2009   ATRIAL FIBRILLATION, PAROXYSMAL 12/06/2009    Social Hx   Social History   Socioeconomic History   Marital status: Married    Spouse name: Not on file   Number of children: Not on file   Years of education: Not on file   Highest education level: Not on file  Occupational History   Occupation: retired  Tobacco Use   Smoking status: Former    Packs/day: 0.25    Years: 3.00    Total pack years: 0.75    Types: Cigarettes    Quit date: 03/30/1969    Years since quitting: 53.1    Passive exposure: Past   Smokeless tobacco: Never  Vaping Use   Vaping Use: Never used   Substance and Sexual Activity   Alcohol use: Yes    Alcohol/week: 14.0 standard drinks of alcohol    Types: 7 Glasses of wine, 7 Shots of liquor per week    Comment: Jack nightly   Drug use: No   Sexual activity: Yes  Other Topics Concern   Not on file  Social History Narrative   Not on file   Social Determinants of Health   Financial Resource Strain: Low Risk  (11/12/2020)   Overall Financial Resource Strain (CARDIA)    Difficulty of Paying Living Expenses: Not hard at all  Food Insecurity: No Food Insecurity (11/25/2021)   Hunger Vital Sign    Worried About Running Out of Food in the Last Year: Never true    Ran Out of Food in the Last Year: Never true  Transportation Needs: No Transportation Needs (11/25/2021)   PRAPARE - Hydrologist (Medical): No    Lack of Transportation (Non-Medical): No  Physical Activity: Sufficiently Active (11/25/2021)   Exercise Vital Sign    Days of Exercise per Week: 5 days    Minutes of Exercise per Session: 30 min  Stress: No Stress Concern Present (11/25/2021)   [place]    Feeling of Stress : Not at all  Social Connections:  Socially Integrated (11/25/2021)   Social Connection and Isolation Panel [NHANES]    Frequency of Communication with Friends and Family: More than three times a week    Frequency of Social Gatherings with Friends and Family: More than three times a week    Attends Religious Services: More than 4 times per year    Active Member of [employer] or Organizations: Yes    Attends Music therapist: More than 4 times per year    Marital Status: Married    Review of Systems Per HPI  Objective:  BP 128/82   Pulse 82   Temp 98 F (36.7 C) (Oral)   Ht '5\' 6"'$  (1.676 m)   Wt 195 lb (88.5 kg)   SpO2 96%   BMI 31.47 kg/m      04/27/2022   10:36 AM 04/21/2022   11:32 AM 04/16/2022    3:07 PM  BP/Weight  Systolic BP 191 478  295  Diastolic BP 82 77 72  Wt. (Lbs) 195    BMI 31.47 kg/m2      Physical Exam Vitals and nursing note reviewed.  Constitutional:      General: He is not in acute distress. HENT:     Head: Normocephalic and atraumatic.     Nose: Congestion present. No rhinorrhea.     Mouth/Throat:     Pharynx: Posterior oropharyngeal erythema present.  Cardiovascular:     Rate and Rhythm: Normal rate and regular rhythm.     Heart sounds: Murmur heard.  Pulmonary:     Effort: Pulmonary effort is normal.     Breath sounds: Normal breath sounds. No wheezing, rhonchi or rales.  Neurological:     Mental Status: He is alert.     Lab Results  Component Value Date   WBC 5.3 10/30/2021   HGB 13.9 10/30/2021   HCT 42.2 10/30/2021   PLT 246 10/30/2021   GLUCOSE 156 (H) 03/16/2022   CHOL 161 12/15/2021   TRIG 74 12/15/2021   HDL 48 12/15/2021   LDLCALC 99 12/15/2021   ALT 17 12/15/2021   AST 22 12/15/2021   NA 142 03/16/2022   K 4.0 03/16/2022   CL 107 03/16/2022   CREATININE 1.26 (H) 03/16/2022   BUN 23 03/16/2022   CO2 27 03/16/2022   PSA 1.90 03/16/2014   INR 1.12 09/18/2010   HGBA1C 6.1 (H) 05/24/2017     Assessment & Plan:   Problem List Items Addressed This Visit       Respiratory   Acute rhinosinusitis - Primary    Treating with Augmentin. Atrovent as prescribed.      Relevant Medications   amoxicillin-clavulanate (AUGMENTIN) 875-125 MG tablet   ipratropium (ATROVENT) 0.06 % nasal spray    Meds ordered this encounter  Medications   amoxicillin-clavulanate (AUGMENTIN) 875-125 MG tablet    Sig: Take 1 tablet by mouth 2 (two) times daily.    Dispense:  14 tablet    Refill:  0   ipratropium (ATROVENT) 0.06 % nasal spray    Sig: Place 2 sprays into both nostrils 4 (four) times daily as needed for rhinitis.    Dispense:  15 mL    Refill:  0    Follow-up:  Return if symptoms worsen or fail to improve.  [place]

## 2022-05-21 ENCOUNTER — Ambulatory Visit (INDEPENDENT_AMBULATORY_CARE_PROVIDER_SITE_OTHER): Payer: PPO | Admitting: Urology

## 2022-05-21 ENCOUNTER — Encounter: Payer: Self-pay | Admitting: Urology

## 2022-05-21 VITALS — BP 151/78 | HR 65

## 2022-05-21 DIAGNOSIS — R3915 Urgency of urination: Secondary | ICD-10-CM | POA: Diagnosis not present

## 2022-05-21 DIAGNOSIS — N3281 Overactive bladder: Secondary | ICD-10-CM | POA: Diagnosis not present

## 2022-05-21 DIAGNOSIS — I10 Essential (primary) hypertension: Secondary | ICD-10-CM | POA: Diagnosis not present

## 2022-05-21 DIAGNOSIS — R350 Frequency of micturition: Secondary | ICD-10-CM | POA: Diagnosis not present

## 2022-05-21 DIAGNOSIS — N401 Enlarged prostate with lower urinary tract symptoms: Secondary | ICD-10-CM

## 2022-05-21 DIAGNOSIS — N138 Other obstructive and reflux uropathy: Secondary | ICD-10-CM | POA: Diagnosis not present

## 2022-05-21 LAB — URINALYSIS, ROUTINE W REFLEX MICROSCOPIC
Bilirubin, UA: NEGATIVE
Glucose, UA: NEGATIVE
Ketones, UA: NEGATIVE
Leukocytes,UA: NEGATIVE
Nitrite, UA: NEGATIVE
RBC, UA: NEGATIVE
Specific Gravity, UA: 1.015 (ref 1.005–1.030)
Urobilinogen, Ur: 1 mg/dL (ref 0.2–1.0)
pH, UA: 6.5 (ref 5.0–7.5)

## 2022-05-21 MED ORDER — TAMSULOSIN HCL 0.4 MG PO CAPS: 0.8000 mg | ORAL_CAPSULE | Freq: Every day | ORAL | 11 refills | Status: DC

## 2022-05-21 NOTE — Progress Notes (Signed)
Subjective: 1. Benign prostatic hyperplasia with urinary frequency   2. Urgency of urination      05/21/22: [name] returns today in f/u.  He didn't benefit from the Myrbetriq '50mg'$  and stopped that.  His diuretic has been cut back and he takes it at night which is better for him.  He is content with the tamsulosin but would like to try 0.'8mg'$ .  His PVR is 61m.  His IPSS is 15 with nocturia x 4.   04/16/22: MZakiahreturns today in f/u. He has BPH with OAB and UUI.  [name]  He is on tamsulosin which has helped but he still has UUI.  HIs UA is clear and his PVR is 949m   11/14/20: [name] Bondeturns today in f/u to assess his response to Myrbetriq.   He thinks he is doing better with reduced frequency and urgency.  He has reduced UUI as well.   His PVR is stable at 6649m  He has had some constipation and mild headache just today.  He had a CT last week and was found to have diverticulitis.  He is on Cipro and Metronidazole.  He has had side effects with the antibiotics.    [name] today in f/u.  He was last seen in 2018 for BPH with BOO with urethral bleeding that was associated with urethral distention.  He had been on finasteride but has been off for a while.  He currently reports urgency and has UUI.  He has nocturia x 2.  His last PSA was stable at 2.0 in 1/19.  Dr. LukWolfgang Phoenixdered one that was drawn today.  His PVR is 10m52m He has had some recent constipation that is associated with chronic opioid for low back pain.  He had a CT stone study in 5/21 that showed no stones.    IPSS     Row Name 05/21/22 1100         International Prostate Symptom Score   How often have you had the sensation of not emptying your bladder? Less than half the time     How often have you had to urinate less than every two hours? Less than half the time     How often have you found you stopped and started again several times when you urinated? More than half the time      How often have you found it difficult to postpone urination? Not at All     How often have you had a weak urinary stream? About half the time     How often have you had to strain to start urination? Not at All     How many times did you typically get up at night to urinate? 4 Times     Total IPSS Score 15       Quality of Life due to urinary symptoms   If you were to spend the rest of your life with your urinary condition just the way it is now how would you feel about that? Mostly Disatisfied               ROS:  Review of Systems  HENT:  Positive for congestion.   Respiratory:  Positive for shortness of breath.   Cardiovascular:  Positive for leg swelling.  Neurological:  Positive for weakness.  Endo/Heme/Allergies:  Bruises/bleeds easily.    Allergies  Allergen Reactions   Sectral [Acebutolol Hcl] Other (See Comments)  Unknown reaction   Sulfonamide Derivatives Other (See Comments)    Runs blood pressure up   Zocor [Simvastatin] Other (See Comments)    Joint pain    Past Medical History:  Diagnosis Date   Arthritis    Cancer ([place])    skin   Carpal tunnel syndrome    Cataract    left eye   Chronic back pain    multiple back surgeries   Complication of anesthesia    pt states woke up during hemorroid surgery   DVT (deep venous thrombosis) ([place]) 03/2007   left leg    Dysrhythmia    GERD (gastroesophageal reflux disease)    takes Protonix daily   Glaucoma    History of colon polyps    History of hyperglycemia    History of MRSA infection 1992   HTN (hypertension)    takes  Ramipril daily   Hyperlipidemia    takes Pravastatin 3 times a week   Nocturia    Paroxysmal atrial fibrillation ([place])    takes Pradaxa daily, Chads2vasc score 2(07/11/14)   Pneumonia    many yrs ago   Pulmonary embolism ([place]) 2008    Past Surgical History:  Procedure Laterality Date   APPLICATION OF ROBOTIC ASSISTANCE FOR SPINAL PROCEDURE  05/06/2021   Procedure:  APPLICATION OF ROBOTIC ASSISTANCE FOR SPINAL PROCEDURE;  Surgeon: [name] Miss, MD;  Location: [place];  Service: Neurosurgery;;   BACK SURGERY     BIOPSY  04/01/2018   Procedure: BIOPSY;  Surgeon: [name] Houston, MD;  [facility];  Service: Endoscopy;;  gastric   CARDIAC CATHETERIZATION  05/18/2006   CARPAL TUNNEL RELEASE Right    cataract surgery Right    cervical and lumbar surgery     COLONOSCOPY  04/24/2011   Procedure: COLONOSCOPY;  Surgeon: [name] Houston, MD;  [facility];  Service: Endoscopy;  Laterality: N/A;  1200   COLONOSCOPY N/A 08/13/2016   Procedure: COLONOSCOPY;  Surgeon: [name] Houston, MD;  [facility];  Service: Endoscopy;  Laterality: N/A;  1200   ESOPHAGOGASTRODUODENOSCOPY  03/10/2012   Procedure: ESOPHAGOGASTRODUODENOSCOPY (EGD);  Surgeon: [name] Houston, MD;  [facility];  Service: Endoscopy;  Laterality: N/A;  325   ESOPHAGOGASTRODUODENOSCOPY (EGD) WITH PROPOFOL N/A 04/01/2018   Procedure: ESOPHAGOGASTRODUODENOSCOPY (EGD) WITH PROPOFOL;  Surgeon: [name] Houston, MD;  [facility];  Service: Endoscopy;  Laterality: N/A;   EYE SURGERY     HARDWARE REMOVAL N/A 04/24/2014   Procedure: REMOVAL OF SACRAL INSTRUMENTATION WITH [name];  Surgeon: [name] Miss, MD;  Location: [place] [facility];  Service: Neurosurgery;  Laterality: N/A;   HEMORRHOIDECTOMY WITH HEMORRHOID BANDING     JOINT REPLACEMENT     KNEE ARTHROSCOPY Right    KNEE ARTHROSCOPY WITH MEDIAL MENISECTOMY Right 08/03/2018   Procedure: RIGHT KNEE ARTHROSCOPY WITH PARTIAL MEDIAL MENISCECTOMY;  Surgeon: [name] Koyanagi, MD;  Location: [place];  Service: Orthopedics;  Laterality: Right;   KNEE SURGERY     left   LASIK     POLYPECTOMY  08/13/2016   Procedure: POLYPECTOMY;  Surgeon: [name] Houston, MD;  [facility];  Service: Endoscopy;;  colon   PVI  01/24/2010   afib ablation   TRANSFORAMINAL LUMBAR INTERBODY FUSION (TLIF)  WITH PEDICLE SCREW FIXATION 1 LEVEL N/A 05/06/2021   Procedure: Transforaminal Lumbar Interbody Fusion  - Thoracic twelve-Lumbar one fixation Thoracic nine to Lumbar one with methacrylate augmentation with mazor;  Surgeon: [name] Miss, MD;  Location: [place];  Service: Neurosurgery;  Laterality: N/A;    Social History   Socioeconomic History   Marital status: Married    Spouse name: Not on file   Number of children: Not on file   Years of education: Not on file   Highest education level: Not on file  Occupational History   Occupation: retired  Tobacco Use   Smoking status: Former    Packs/day: 0.25    Years: 3.00    Total pack years: 0.75    Types: Cigarettes    Quit date: 03/30/1969    Years since quitting: 53.1    Passive exposure: Past   Smokeless tobacco: Never  Vaping Use   Vaping Use: Never used  Substance and Sexual Activity   Alcohol use: Yes    Alcohol/week: 14.0 standard drinks of alcohol    Types: 7 Glasses of wine, 7 Shots of liquor per week    Comment: Jack nightly   Drug use: No   Sexual activity: Yes  Other Topics Concern   Not on file  Social History Narrative   Not on file   Social Determinants of Health   Financial Resource Strain: Low Risk  (11/12/2020)   Overall Financial Resource Strain (CARDIA)    Difficulty of Paying Living Expenses: Not hard at all  Food Insecurity: No Food Insecurity (11/25/2021)   Hunger Vital Sign    Worried About Running Out of Food in the Last Year: Never true    Ran Out of Food in the Last Year: Never true  Transportation Needs: No Transportation Needs (11/25/2021)   PRAPARE - Hydrologist (Medical): No    Lack of Transportation (Non-Medical): No  Physical Activity: Sufficiently Active (11/25/2021)   Exercise Vital Sign    Days of Exercise per Week: 5 days    Minutes of Exercise per Session: 30 min  Stress: No Stress Concern Present (11/25/2021)   [place]    Feeling of Stress : Not at all  Social Connections: [place] (11/25/2021)   Social Connection and Isolation Panel [NHANES]    Frequency of Communication with Friends and Family: More than three times a week    Frequency of Social Gatherings with Friends and Family: More than three times a week    Attends Religious Services: More than 4 times per year    Active Member of [employer] or Organizations: Yes    Attends Music therapist: More than 4 times per year    Marital Status: Married  Human resources officer Violence: Not At Risk (11/25/2021)   Humiliation, Afraid, Rape, and Kick questionnaire    Fear of Current or Ex-Partner: No    Emotionally Abused: No    Physically Abused: No    Sexually Abused: No    Family History  Problem Relation Age of Onset   Hypertension Mother    Transient ischemic attack Mother    Heart disease Father    Diabetes Brother    Healthy Daughter    Colon cancer Sister    Hypertension Other    Heart disease Other     Anti-infectives: Anti-infectives (From admission, onward)    None       Current Outpatient Medications  Medication Sig Dispense Refill   amLODipine (NORVASC) 10 MG tablet TAKE 1 TABLET BY MOUTH EVERY DAY 90 tablet 1   ELIQUIS 5 MG TABS tablet TAKE 1 TABLET  BY MOUTH TWICE A DAY. STOP PRADAXA 60 tablet 5   indapamide (LOZOL) 1.25 MG tablet Take 1 tablet (1.25 mg total) by mouth daily. 30 tablet 5   ipratropium (ATROVENT) 0.06 % nasal spray Place 2 sprays into both nostrils 4 (four) times daily as needed for rhinitis. 15 mL 0   ketorolac (ACULAR) 0.5 % ophthalmic solution Place 1 drop into the left eye 2 (two) times daily.     metoprolol succinate (TOPROL-XL) 25 MG 24 hr tablet Take 1 tablet (25 mg total) by mouth 2 (two) times daily. 180 tablet 2   Oxycodone HCl 10 MG TABS Take 1 tablet (10 mg total) by mouth 4 (four) times daily as needed. 120 tablet 0   pantoprazole (PROTONIX) 40 MG  tablet Take 1 tablet (40 mg total) by mouth daily before breakfast. 90 tablet 1   pravastatin (PRAVACHOL) 80 MG tablet TAKE 1 TABLET BY MOUTH EVERYDAY AT BEDTIME 90 tablet 1   psyllium (METAMUCIL SMOOTH TEXTURE) 58.6 % powder Take 1 packet by mouth at bedtime.     ramipril (ALTACE) 10 MG capsule TAKE 1 CAPSULE BY MOUTH TWICE A DAY 180 capsule 1   torsemide (DEMADEX) 10 MG tablet Take 1 tablet (10 mg total) by mouth daily. 30 tablet 5   tamsulosin (FLOMAX) 0.4 MG CAPS capsule Take 2 capsules (0.8 mg total) by mouth daily. 60 capsule 11   No current facility-administered medications for this visit.     Objective: Vital signs in last 24 hours: BP (!) 151/78   Pulse 65   Intake/Output from previous day: No intake/output data recorded. Intake/Output this shift: '@IOTHISSHIFT'$ @   Physical Exam Vitals reviewed.  Constitutional:      Appearance: Normal appearance.  Neurological:     Mental Status: He is alert.     Lab Results:  Results for orders placed or performed in visit on 05/21/22 (from the past 24 hour(s))  Urinalysis, Routine w reflex microscopic     Status: None   Collection Time: 05/21/22 10:47 AM  Result Value Ref Range   Specific Gravity, UA 1.015 1.005 - 1.030   pH, UA 6.5 5.0 - 7.5   Color, UA Yellow Yellow   Appearance Ur Clear Clear   Leukocytes,UA Negative Negative   Protein,UA Trace Negative/Trace   Glucose, UA Negative Negative   Ketones, UA Negative Negative   RBC, UA Negative Negative   Bilirubin, UA Negative Negative   Urobilinogen, Ur 1.0 0.2 - 1.0 mg/dL   Nitrite, UA Negative Negative   Microscopic Examination Comment    Narrative   Performed at:  [facility] [address] [place], [place]  [id number] Lab Director: [place], Phone:  [phone]      BMET No results for input(s): "NA", "K", "CL", "CO2", "GLUCOSE", "BUN", "CREATININE", "CALCIUM" in the last 72 hours.  PT/INR No results for input(s): "LABPROT", "INR" in  the last 72 hours. ABG No results for input(s): "PHART", "HCO3" in the last 72 hours.  Invalid input(s): "PCO2", "PO2" UA is clear.  Studies/Results: PVR 26m.    Assessment/Plan: OAB wet with a history of BPH with BOO but minimal obstructive symptoms on tamsulosin.  He didn't do well on the Myrbetriq.  He will try tamsulosin 0.'4mg'$  bid.  He will return in 657month   Meds ordered this encounter  Medications   tamsulosin (FLOMAX) 0.4 MG CAPS capsule    Sig: Take 2 capsules (0.8 mg total) by mouth daily.    Dispense:  60 capsule    Refill:  11     Orders Placed This Encounter  Procedures   Urinalysis, Routine w reflex microscopic   BLADDER SCAN AMB NON-IMAGING     Return in about 6 months (around 11/19/2022).    CC: Dr. [name].      Irine Seal 05/21/2022 [phone] ID: [name], male   DOB: [birth year], 82 y.o.   MRN: [id number] Patient ID: [name], male   DOB: [birth year], 82 y.o.   MRN: [id number]

## 2022-05-21 NOTE — Progress Notes (Signed)
Pt here today for bladder scan. Bladder was scanned and 19 was visualized.    Performed by Marisue Brooklyn, CMA

## 2022-05-22 LAB — BASIC METABOLIC PANEL
BUN/Creatinine Ratio: 19 (ref 10–24)
BUN: 19 mg/dL (ref 8–27)
CO2: 26 mmol/L (ref 20–29)
Calcium: 9.4 mg/dL (ref 8.6–10.2)
Chloride: 103 mmol/L (ref 96–106)
Creatinine, Ser: 1 mg/dL (ref 0.76–1.27)
Glucose: 94 mg/dL (ref 70–99)
Potassium: 4.2 mmol/L (ref 3.5–5.2)
Sodium: 144 mmol/L (ref 134–144)
eGFR: 76 mL/min/{1.73_m2} (ref >59)

## 2022-05-25 DIAGNOSIS — H401132 Primary open-angle glaucoma, bilateral, moderate stage: Secondary | ICD-10-CM | POA: Diagnosis not present

## 2022-05-25 DIAGNOSIS — H01026 Squamous blepharitis left eye, unspecified eyelid: Secondary | ICD-10-CM | POA: Diagnosis not present

## 2022-05-25 DIAGNOSIS — H01023 Squamous blepharitis right eye, unspecified eyelid: Secondary | ICD-10-CM | POA: Diagnosis not present

## 2022-05-25 DIAGNOSIS — H16223 Keratoconjunctivitis sicca, not specified as Sjogren's, bilateral: Secondary | ICD-10-CM | POA: Diagnosis not present

## 2022-06-03 DIAGNOSIS — L905 Scar conditions and fibrosis of skin: Secondary | ICD-10-CM | POA: Diagnosis not present

## 2022-06-10 ENCOUNTER — Other Ambulatory Visit: Payer: Self-pay | Admitting: Family Medicine

## 2022-06-10 DIAGNOSIS — K219 Gastro-esophageal reflux disease without esophagitis: Secondary | ICD-10-CM

## 2022-06-13 ENCOUNTER — Other Ambulatory Visit: Payer: Self-pay | Admitting: Family Medicine

## 2022-06-25 ENCOUNTER — Encounter (HOSPITAL_COMMUNITY): Payer: Self-pay | Admitting: *Deleted

## 2022-07-08 DIAGNOSIS — C44212 Basal cell carcinoma of skin of right ear and external auricular canal: Secondary | ICD-10-CM | POA: Diagnosis not present

## 2022-07-08 DIAGNOSIS — C44319 Basal cell carcinoma of skin of other parts of face: Secondary | ICD-10-CM | POA: Diagnosis not present

## 2022-07-16 ENCOUNTER — Ambulatory Visit (INDEPENDENT_AMBULATORY_CARE_PROVIDER_SITE_OTHER): Payer: PPO | Admitting: Family Medicine

## 2022-07-16 VITALS — BP 132/81 | HR 72 | Wt 195.6 lb

## 2022-07-16 DIAGNOSIS — Z79891 Long term (current) use of opiate analgesic: Secondary | ICD-10-CM | POA: Diagnosis not present

## 2022-07-16 DIAGNOSIS — R3916 Straining to void: Secondary | ICD-10-CM

## 2022-07-16 DIAGNOSIS — G8929 Other chronic pain: Secondary | ICD-10-CM

## 2022-07-16 DIAGNOSIS — M549 Dorsalgia, unspecified: Secondary | ICD-10-CM | POA: Diagnosis not present

## 2022-07-16 DIAGNOSIS — R0609 Other forms of dyspnea: Secondary | ICD-10-CM | POA: Diagnosis not present

## 2022-07-16 DIAGNOSIS — I700 Atherosclerosis of aorta: Secondary | ICD-10-CM

## 2022-07-16 DIAGNOSIS — D6869 Other thrombophilia: Secondary | ICD-10-CM

## 2022-07-16 DIAGNOSIS — N401 Enlarged prostate with lower urinary tract symptoms: Secondary | ICD-10-CM

## 2022-07-16 DIAGNOSIS — I480 Paroxysmal atrial fibrillation: Secondary | ICD-10-CM | POA: Diagnosis not present

## 2022-07-16 NOTE — Progress Notes (Signed)
Subjective:    Patient ID: [name], male    DOB: 1941/01/09, 82 y.o.   MRN: [id number]  HPI This patient was seen today for chronic pain  The medication list was reviewed and updated.  Location of Pain for which the patient has been treated with regarding narcotics: Lumbar pain thoracic and neck pain  Onset of this pain: He has had this for years has had multiple surgery has been on pain medication for years has been very responsible with utilizing it   -Compliance with medication: Good compliance  - Number patient states they take daily: Takes anywhere from 3 or 4 daily.  Does not want to be on stronger pain medicine.  Does not want to go to pain management.  -when was the last dose patient took? 10:45 am today  The patient was advised the importance of maintaining medication and not using illegal substances with these.  Here for refills and follow up  The patient was educated that we can provide 3 monthly scripts for their medication, it is their responsibility to follow the instructions.  Side effects or complications from medications: Denies any side effects  Patient is aware that pain medications are meant to minimize the severity of the pain to allow their pain levels to improve to allow for better function. They are aware of that pain medications cannot totally remove their pain.  Due for UDT ( at least once per year) : 01/04/2022  Scale of 1 to 10 ( 1 is least 10 is most) Your pain level without the medicine: 9 Your pain level with medication 5  Scale 1 to 10 ( 1-helps very little, 10 helps very well) How well does your pain medication reduce your pain so you can function better through out the day? 7  Quality of the pain: Aching throbbing  Persistence of the pain: Persistent all the time  Modifying factors: Worse with activity  Chronic back pain greater than 3 months duration  DOE (dyspnea on exertion)  Benign prostatic hyperplasia (BPH) with  straining on urination  Encounter for long-term use of opiate analgesic  Secondary hypercoagulable state ([place])  Aortic atherosclerosis ([place]), Chronic  Paroxysmal atrial fibrillation ([place]), Chronic       Review of Systems     Objective:   Physical Exam General-in no acute distress Eyes-no discharge Lungs-respiratory rate normal, CTA CV-no murmurs, atrial fibs noted Extremities skin warm dry no edema Neuro grossly normal Behavior normal, alert        Assessment & Plan:  1. Chronic back pain greater than 3 months duration He is doing the best he can Does not want to do any further surgery Very debilitated by what is going on Is on chronic opioids Keeps up-to-date on urine drug screen Drug registry checked  2. DOE (dyspnea on exertion) He does get out of breath when he pushes himself but this is related into his atrial fibrillation currently heart rate under good control  3. Benign prostatic hyperplasia (BPH) with straining on urination Takes tamsulosin 2/day states his urinary flow is fair  4. Encounter for long-term use of opiate analgesic The patient was seen in followup for chronic pain. A review over at their current pain status was discussed. Drug registry was checked. Prescriptions were given.  Regular follow-up recommended. Discussion was held regarding the importance of compliance with medication as well as pain medication contract.  Patient was informed that medication may cause drowsiness and should not be combined  with other  medications/alcohol or street drugs. If the patient feels medication is causing altered alertness then do not drive or operate dangerous equipment.  Should be noted that the patient appears to be meeting appropriate use of opioids and response.  Evidenced by improved function and decent pain control without significant side effects and no evidence of overt aberrancy issues.  Upon discussion with the patient today they understand that  opioid therapy is optional and they feel that the pain has been refractory to reasonable conservative measures and is significant and affecting quality of life enough to warrant ongoing therapy and wishes to continue opioids.  Refills were provided.   Patient does have secondary hypercoagulability issue related into his medication for atrial fibs No bleeding issues with this Atrial fibs under decent control Aortic atherosclerosis continue statin

## 2022-07-20 ENCOUNTER — Telehealth: Payer: Self-pay | Admitting: Family Medicine

## 2022-07-20 MED ORDER — OXYCODONE HCL 10 MG PO TABS: ORAL_TABLET | ORAL | 0 refills | Status: DC

## 2022-07-20 NOTE — Telephone Encounter (Signed)
Requesting refill on oxycodone 10 mg forgot to ask at appointment on 2/29.[facility]

## 2022-07-20 NOTE — Telephone Encounter (Signed)
3 prescriptions were sent in as requested, next prescription can be filled on March 9 which is Saturday thank you this is based on the 30-day state law in regards to pain medicine every 30 days thank you

## 2022-07-20 NOTE — Addendum Note (Signed)
Addended by: [name] A on: 07/20/2022 12:48 PM   Modules accepted: Orders

## 2022-07-20 NOTE — Telephone Encounter (Signed)
Patient notified and verbalized understanding. 

## 2022-07-31 NOTE — Telephone Encounter (Signed)
error 

## 2022-08-05 DIAGNOSIS — M545 Low back pain, unspecified: Secondary | ICD-10-CM | POA: Diagnosis not present

## 2022-08-05 DIAGNOSIS — M1612 Unilateral primary osteoarthritis, left hip: Secondary | ICD-10-CM | POA: Diagnosis not present

## 2022-08-06 ENCOUNTER — Ambulatory Visit: Payer: PPO | Attending: Internal Medicine | Admitting: Internal Medicine

## 2022-08-06 ENCOUNTER — Encounter: Payer: Self-pay | Admitting: Internal Medicine

## 2022-08-06 VITALS — BP 142/80 | HR 67 | Ht 66.0 in | Wt 199.0 lb

## 2022-08-06 DIAGNOSIS — I350 Nonrheumatic aortic (valve) stenosis: Secondary | ICD-10-CM | POA: Insufficient documentation

## 2022-08-06 NOTE — Patient Instructions (Signed)
Medication Instructions:  Your physician recommends that you continue on your current medications as directed. Please refer to the Current Medication list given to you today.  *If you need a refill on your cardiac medications before your next appointment, please call your pharmacy*   Lab Work: NONE   If you have labs (blood work) drawn today and your tests are completely normal, you will receive your results only by: MyChart Message (if you have MyChart) OR A paper copy in the mail If you have any lab test that is abnormal or we need to change your treatment, we will call you to review the results.   Testing/Procedures: NONE  Follow-Up: At [facility], you and your health needs are our priority.  As part of our continuing mission to provide you with exceptional heart care, we have created designated Provider Care Teams.  These Care Teams include your primary Cardiologist (physician) and Advanced Practice Providers (APPs -  Physician Assistants and Nurse Practitioners) who all work together to provide you with the care you need, when you need it.  We recommend signing up for the patient portal called "MyChart".  Sign up information is provided on this After Visit Summary.  MyChart is used to connect with patients for Virtual Visits (Telemedicine).  Patients are able to view lab/test results, encounter notes, upcoming appointments, etc.  Non-urgent messages can be sent to your provider as well.   To learn more about what you can do with MyChart, go to [email or web address].    Your next appointment:   1 year(s)  Provider:   Vishnu Mallipeddi, MD    Other Instructions Thank you for choosing [facility]!    

## 2022-08-06 NOTE — Progress Notes (Signed)
Cardiology Office Note  Date: 08/06/2022   ID: ENRIGUE [name], DOB 08/04/1940, MRN [id number]  PCP:  Kathyrn Drown, MD  Cardiologist:  None Electrophysiologist:  None   Reason for Office Visit: Follow-up of paroxysmal A-fib and mild aortic valve stenosis   History of Present Illness: [name] is a 82 y.o. male known to have paroxysmal A-fib s/p ablation in 2011, history of DVT/PE on Eliquis, HTN, HLD, mild aortic valve stenosis, chronic back pain (had multiple back surgeries) presented to cardiology clinic for follow-up visit.  Has no symptoms of chest pain. Had shortness of breath for the last 3 months but getting better. He also had palpitations (feeling like his heart is getting a beat) but that resolved in 3 months as well. He has [place] mobile at home but he never installed the app. He denies any symptoms of dizziness, lightheadedness, syncope, LE swelling and claudication.  Past Medical History:  Diagnosis Date   Arthritis    Cancer ([name])    skin   Carpal tunnel syndrome    Cataract    left eye   Chronic back pain    multiple back surgeries   Complication of anesthesia    pt states woke up during hemorroid surgery   DVT (deep venous thrombosis) ([place]) 03/2007   left leg    Dysrhythmia    GERD (gastroesophageal reflux disease)    takes Protonix daily   Glaucoma    History of colon polyps    History of hyperglycemia    History of MRSA infection 1992   HTN (hypertension)    takes  Ramipril daily   Hyperlipidemia    takes Pravastatin 3 times a week   Nocturia    Paroxysmal atrial fibrillation (HCC)    takes Pradaxa daily, Chads2vasc score 2(07/11/14)   Pneumonia    many yrs ago   Pulmonary embolism ([place]) 2008    Past Surgical History:  Procedure Laterality Date   APPLICATION OF ROBOTIC ASSISTANCE FOR SPINAL PROCEDURE  05/06/2021   Procedure: APPLICATION OF ROBOTIC ASSISTANCE FOR SPINAL PROCEDURE;  Surgeon: Kristeen Miss, MD;  Location: [facility];  Service:  Neurosurgery;;   BACK SURGERY     BIOPSY  04/01/2018   Procedure: BIOPSY;  Surgeon: Rogene Houston, MD;  [facility];  Service: Endoscopy;;  gastric   CARDIAC CATHETERIZATION  05/18/2006   CARPAL TUNNEL RELEASE Right    cataract surgery Right    cervical and lumbar surgery     COLONOSCOPY  04/24/2011   Procedure: COLONOSCOPY;  Surgeon: Rogene Houston, MD;  [facility];  Service: Endoscopy;  Laterality: N/A;  1200   COLONOSCOPY N/A 08/13/2016   Procedure: COLONOSCOPY;  Surgeon: Rogene Houston, MD;  [facility];  Service: Endoscopy;  Laterality: N/A;  1200   ESOPHAGOGASTRODUODENOSCOPY  03/10/2012   Procedure: ESOPHAGOGASTRODUODENOSCOPY (EGD);  Surgeon: Rogene Houston, MD;  [facility];  Service: Endoscopy;  Laterality: N/A;  325   ESOPHAGOGASTRODUODENOSCOPY (EGD) WITH PROPOFOL N/A 04/01/2018   Procedure: ESOPHAGOGASTRODUODENOSCOPY (EGD) WITH PROPOFOL;  Surgeon: Rogene Houston, MD;  [facility];  Service: Endoscopy;  Laterality: N/A;   EYE SURGERY     HARDWARE REMOVAL N/A 04/24/2014   Procedure: REMOVAL OF SACRAL INSTRUMENTATION WITH Speed;  Surgeon: Kristeen Miss, MD;  Location: [facility];  Service: Neurosurgery;  Laterality: N/A;   HEMORRHOIDECTOMY WITH HEMORRHOID BANDING     JOINT REPLACEMENT     KNEE ARTHROSCOPY  Right    KNEE ARTHROSCOPY WITH MEDIAL MENISECTOMY Right 08/03/2018   Procedure: RIGHT KNEE ARTHROSCOPY WITH PARTIAL MEDIAL MENISCECTOMY;  Surgeon: Leandrew Koyanagi, MD;  Location: [facility];  Service: Orthopedics;  Laterality: Right;   KNEE SURGERY     left   LASIK     POLYPECTOMY  08/13/2016   Procedure: POLYPECTOMY;  Surgeon: Rogene Houston, MD;  [facility];  Service: Endoscopy;;  colon   PVI  01/24/2010   afib ablation   TRANSFORAMINAL LUMBAR INTERBODY FUSION (TLIF) WITH PEDICLE SCREW FIXATION 1 LEVEL N/A 05/06/2021   Procedure: Transforaminal Lumbar Interbody Fusion  -  Thoracic twelve-Lumbar one fixation Thoracic nine to Lumbar one with methacrylate augmentation with mazor;  Surgeon: Kristeen Miss, MD;  Location: [facility];  Service: Neurosurgery;  Laterality: N/A;    Current Outpatient Medications  Medication Sig Dispense Refill   amLODipine (NORVASC) 10 MG tablet TAKE 1 TABLET BY MOUTH EVERY DAY 90 tablet 1   ELIQUIS 5 MG TABS tablet TAKE 1 TABLET BY MOUTH TWICE A DAY. STOP PRADAXA 60 tablet 5   indapamide (LOZOL) 1.25 MG tablet Take 1 tablet (1.25 mg total) by mouth daily. 30 tablet 5   ketorolac (ACULAR) 0.5 % ophthalmic solution Place 1 drop into the left eye 2 (two) times daily.     latanoprost (XALATAN) 0.005 % ophthalmic solution SMARTSIG:1 Drop(s) In Eye(s) Every Evening     metoprolol succinate (TOPROL-XL) 25 MG 24 hr tablet Take 1 tablet (25 mg total) by mouth 2 (two) times daily. 180 tablet 2   Oxycodone HCl 10 MG TABS Take 1 tablet (10 mg total) by mouth 4 (four) times daily as needed. 120 tablet 0   pantoprazole (PROTONIX) 40 MG tablet TAKE 1 TABLET BY MOUTH DAILY BEFORE BREAKFAST 90 tablet 1   pravastatin (PRAVACHOL) 80 MG tablet TAKE 1 TABLET BY MOUTH EVERYDAY AT BEDTIME 90 tablet 1   psyllium (METAMUCIL SMOOTH TEXTURE) 58.6 % powder Take 1 packet by mouth at bedtime.     ramipril (ALTACE) 10 MG capsule TAKE 1 CAPSULE BY MOUTH TWICE A DAY 180 capsule 1   tamsulosin (FLOMAX) 0.4 MG CAPS capsule TAKE 1 CAPSULE BY MOUTH EVERY DAY 90 capsule 1   torsemide (DEMADEX) 10 MG tablet Take 1 tablet (10 mg total) by mouth daily. 30 tablet 5   XDEMVY 0.25 % SOLN Apply to eye.     ipratropium (ATROVENT) 0.06 % nasal spray Place 2 sprays into both nostrils 4 (four) times daily as needed for rhinitis. (Patient not taking: Reported on 07/16/2022) 15 mL 0   Oxycodone HCl 10 MG TABS 1 taken 4 times daily as needed for pain caution drowsiness (Patient not taking: Reported on 08/06/2022) 120 tablet 0   Oxycodone HCl 10 MG TABS 1 taken 4 times daily as needed for pain  caution drowsiness (Patient not taking: Reported on 08/06/2022) 120 tablet 0   No current facility-administered medications for this visit.   Allergies:  Sectral [acebutolol hcl], Sulfonamide derivatives, and Zocor [simvastatin]   Social History: The patient  reports that he quit smoking about 53 years ago. His smoking use included cigarettes. He has a 0.75 pack-year smoking history. He has been exposed to tobacco smoke. He has never used smokeless tobacco. He reports current alcohol use of about 14.0 standard drinks of alcohol per week. He reports that he does not use drugs.   Family History: The patient's family history includes Colon cancer in his sister; Diabetes in  his brother; Healthy in his daughter; Heart disease in his father and another family member; Hypertension in his mother and another family member; Transient ischemic attack in his mother.   ROS:  Please see the history of present illness. Otherwise, complete review of systems is positive for none.  All other systems are reviewed and negative.   Physical Exam: VS:  BP (!) 142/80   Pulse 67   Ht 5\' 6"  (1.676 m)   Wt 199 lb (90.3 kg)   SpO2 94%   BMI 32.12 kg/m , BMI Body mass index is 32.12 kg/m.  Wt Readings from Last 3 Encounters:  08/06/22 199 lb (90.3 kg)  07/16/22 195 lb 9.6 oz (88.7 kg)  04/27/22 195 lb (88.5 kg)    General: Patient appears comfortable at rest. HEENT: Conjunctiva and lids normal, oropharynx clear with moist mucosa. Neck: Supple, no elevated JVP or carotid bruits, no thyromegaly. Lungs: Clear to auscultation, nonlabored breathing at rest. Cardiac: Regular rate and rhythm, no S3 or significant systolic murmur, no pericardial rub. Abdomen: Soft, nontender, no hepatomegaly, bowel sounds present, no guarding or rebound. Extremities: No pitting edema, distal pulses 2+. Skin: Warm and dry. Musculoskeletal: No kyphosis. Neuropsychiatric: Alert and oriented x3, affect grossly appropriate.  ECG:  NSR  Recent Labwork: 10/30/2021: Hemoglobin 13.9; Magnesium 2.1; Platelets 246 12/15/2021: ALT 17; AST 22 03/16/2022: B Natriuretic Peptide 326.0 05/21/2022: BUN 19; Creatinine, Ser 1.00; Potassium 4.2; Sodium 144     Component Value Date/Time   CHOL 161 12/15/2021 0829   TRIG 74 12/15/2021 0829   HDL 48 12/15/2021 0829   CHOLHDL 3.4 12/15/2021 0829   CHOLHDL 2.9 03/16/2014 1219   VLDL 12 03/16/2014 1219   LDLCALC 99 12/15/2021 0829    Other Studies Reviewed Today:   Assessment and Plan: Patient is 82 year old M known to have paroxysmal A-fib s/p ablation in 2011, history of DVT/PE on Eliquis, HTN, HLD, mild aortic valve stenosis presented to cardiology clinic for follow-up visit.  # Paroxysmal A-fib s/p ablation in 2011 -Continue metoprolol succinate 25 mg twice daily -Continue Eliquis 5 mg twice daily. No risk of falls. No history of CVA. Eliquis will need to be held for 2 to 3 days prior to any procedures including epidural injections. -Palpitations resolved, has [employer] mobile at home, instructed him to monitor his rhythm with a [employer] mobile.  # HTN, controlled -Continue amlodipine 10 mg once daily, metoprolol succinate 25 mg twice daily, ramipril 10 mg twice daily.  HTN management per PCP.  # HLD, at goal -Continue pravastatin 80 mg nightly goal LDL less than 100.  # Mild aortic valve stenosis (on 2023 echo) -Obtain 2D echocardiogram in 2026  I have spent a total of 30 minutes with patient reviewing chart, EKGs, labs and examining patient as well as establishing an assessment and plan that was discussed with the patient.  > 50% of time was spent in direct patient care.     Medication Adjustments/Labs and Tests Ordered: Current medicines are reviewed at length with the patient today.  Concerns regarding medicines are outlined above.   Tests Ordered: No orders of the defined types were placed in this encounter.   Medication Changes: No orders of the defined types were  placed in this encounter.   Disposition:  Follow up  1 year  Signed, Cherylanne Ardelean Fidel Levy, MD, 08/06/2022 3:08 PM    [facility] [address] [place], [place] [address]

## 2022-08-07 ENCOUNTER — Other Ambulatory Visit (HOSPITAL_COMMUNITY): Payer: Self-pay | Admitting: Neurological Surgery

## 2022-08-07 DIAGNOSIS — M4714 Other spondylosis with myelopathy, thoracic region: Secondary | ICD-10-CM | POA: Diagnosis not present

## 2022-08-07 DIAGNOSIS — M25552 Pain in left hip: Secondary | ICD-10-CM | POA: Diagnosis not present

## 2022-08-07 DIAGNOSIS — M5416 Radiculopathy, lumbar region: Secondary | ICD-10-CM | POA: Diagnosis not present

## 2022-08-07 DIAGNOSIS — R109 Unspecified abdominal pain: Secondary | ICD-10-CM | POA: Diagnosis not present

## 2022-08-13 ENCOUNTER — Ambulatory Visit (HOSPITAL_COMMUNITY)
Admission: RE | Admit: 2022-08-13 | Discharge: 2022-08-13 | Disposition: A | Discharge status: Home / Self Care | Payer: PPO | Source: Ambulatory Visit | Attending: Neurological Surgery | Admitting: Neurological Surgery

## 2022-08-13 DIAGNOSIS — M4714 Other spondylosis with myelopathy, thoracic region: Secondary | ICD-10-CM | POA: Diagnosis not present

## 2022-08-13 DIAGNOSIS — M4804 Spinal stenosis, thoracic region: Secondary | ICD-10-CM | POA: Diagnosis not present

## 2022-08-21 DIAGNOSIS — M4714 Other spondylosis with myelopathy, thoracic region: Secondary | ICD-10-CM | POA: Diagnosis not present

## 2022-09-09 DIAGNOSIS — M25552 Pain in left hip: Secondary | ICD-10-CM | POA: Diagnosis not present

## 2022-09-10 ENCOUNTER — Other Ambulatory Visit: Payer: Self-pay | Admitting: Family Medicine

## 2022-09-14 DIAGNOSIS — Z961 Presence of intraocular lens: Secondary | ICD-10-CM | POA: Diagnosis not present

## 2022-09-14 DIAGNOSIS — H401132 Primary open-angle glaucoma, bilateral, moderate stage: Secondary | ICD-10-CM | POA: Diagnosis not present

## 2022-09-14 DIAGNOSIS — H16223 Keratoconjunctivitis sicca, not specified as Sjogren's, bilateral: Secondary | ICD-10-CM | POA: Diagnosis not present

## 2022-09-15 DIAGNOSIS — E669 Obesity, unspecified: Secondary | ICD-10-CM | POA: Diagnosis not present

## 2022-09-15 DIAGNOSIS — Z6830 Body mass index (BMI) 30.0-30.9, adult: Secondary | ICD-10-CM | POA: Diagnosis not present

## 2022-09-15 DIAGNOSIS — J069 Acute upper respiratory infection, unspecified: Secondary | ICD-10-CM | POA: Diagnosis not present

## 2022-09-16 ENCOUNTER — Ambulatory Visit: Payer: PPO | Admitting: Family Medicine

## 2022-10-01 ENCOUNTER — Encounter (INDEPENDENT_AMBULATORY_CARE_PROVIDER_SITE_OTHER): Payer: Self-pay | Admitting: Gastroenterology

## 2022-10-01 ENCOUNTER — Ambulatory Visit (INDEPENDENT_AMBULATORY_CARE_PROVIDER_SITE_OTHER): Payer: PPO | Admitting: Gastroenterology

## 2022-10-01 VITALS — BP 134/69 | HR 65 | Temp 98.0°F | Ht 66.0 in | Wt 201.9 lb

## 2022-10-01 DIAGNOSIS — K219 Gastro-esophageal reflux disease without esophagitis: Secondary | ICD-10-CM | POA: Diagnosis not present

## 2022-10-01 DIAGNOSIS — K5903 Drug induced constipation: Secondary | ICD-10-CM | POA: Diagnosis not present

## 2022-10-01 NOTE — Patient Instructions (Signed)
Continue with your metamucil nightly, good water intake Continue protonix 40mg  daily As discussed, you are due for repeat colonoscopy given history of polyps, given your age and other health concerns, I think it would be reasonable to hold off on further colonoscopies, however, if you want to discuss this with your family and PCP, you can let me know if you decide you would like to proceed with having this done.   Follow up 1 year

## 2022-10-01 NOTE — Progress Notes (Addendum)
Referring Provider: Babs Sciara, MD Primary Care Physician:  Babs Sciara, MD Primary GI Physician: Levon Hedger   Chief Complaint  Patient presents with   Gastroesophageal Reflux    Follow up GERD. Not having any reflux symptoms.    Constipation    Follow up on constipation. Takes a spoonful of metamucil.    HPI:   [name] is a 82 y.o. male with past medical history of arthritis, skin cancer, carpal tunnel syndrome, chronic back pain, DVT, GERD, glaucoma, HTN, HLD   Patient presenting today for follow up of GERD and constipation  Last seen may 2023, at that time having a BM daily without straining, taking metamucil. Genella Rife well controlled on protonix every other day.   Recommended to continue with metamucil 4g nightly, schedule colonoscopy, continue prootonix 40mg  every other day.   Colonoscopy was scheduled but had to be cancelled  as he began having cardiac issues.   Present:  He states he has been doing well except for a few weeks ago when he had a severe episode of burning pain in his lower abdomen. He notes that pain resolved after about 4-5 days, notes he did eat some bbq a bun with seeds on it and wondered if this contributed. Did not have any rectal bleeding or melena. Denies any nausea or vomiting. No further episodes of pain since then.   He notes he is having a BM usually daily, he does big Tablespoon of metamucil each night. Appetite is good. Notes he has gained some weight, is trying to watch what he eats but continues to gain. He is unable to get out and do a lot of activity and has not been able to work outside like he used to due to issues with his back and hip.   GERD is well controlled, he is taking protonix 40mg  daily. He tried to come off but began having symptoms.   He notes that during his last surgery, at the end of 2022 he stopped breathing and had a very difficult time post op. He has had some recent cardiac and respiratory issues as well. He is  concerned about having further colonoscopies due to his health and age.    Last Colonoscopy: 2018, 5 small TAs.  Last EGD 2019  - Normal proximal esophagus. - Longitudinal lines and circumferential rings in mid and distal esophagus. Biopsied. - Z-line irregular, 38 cm from the incisors. - 2 cm hiatal hernia. - Multiple gastric polyps. Biopsied. - Normal cardia, antrum and pylorus. - Normal duodenal bulb and second portion of the duodenum.  Recommendations:    Past Medical History:  Diagnosis Date   Arthritis    Cancer (HCC)    skin   Carpal tunnel syndrome    Cataract    left eye   Chronic back pain    multiple back surgeries   Complication of anesthesia    pt states woke up during hemorroid surgery   DVT (deep venous thrombosis) ([facility]) 03/2007   left leg    Dysrhythmia    GERD (gastroesophageal reflux disease)    takes Protonix daily   Glaucoma    History of colon polyps    History of hyperglycemia    History of MRSA infection 1992   HTN (hypertension)    takes  Ramipril daily   Hyperlipidemia    takes Pravastatin 3 times a week   Nocturia    Paroxysmal atrial fibrillation (H[facility])    takes Pradaxa daily, Chads2vasc score  2(07/11/14)   Pneumonia    many yrs ago   Pulmonary embolism ([facility]) 2008    Past Surgical History:  Procedure Laterality Date   APPLICATION OF ROBOTIC ASSISTANCE FOR SPINAL PROCEDURE  05/06/2021   Procedure: APPLICATION OF ROBOTIC ASSISTANCE FOR SPINAL PROCEDURE;  Surgeon: Barnett Abu, MD;  [facility];  Service: Neurosurgery;;   BACK SURGERY     BIOPSY  04/01/2018   Procedure: BIOPSY;  Surgeon: Malissa Hippo, MD;  [facility];  Service: Endoscopy;;  gastric   CARDIAC CATHETERIZATION  05/18/2006   CARPAL TUNNEL RELEASE Right    cataract surgery Right    cervical and lumbar surgery     COLONOSCOPY  04/24/2011   Procedure: COLONOSCOPY;  Surgeon: Malissa Hippo, MD;  [facility];  Service: Endoscopy;  Laterality:  N/A;  1200   COLONOSCOPY N/A 08/13/2016   Procedure: COLONOSCOPY;  Surgeon: Malissa Hippo, MD;  [facility];  Service: Endoscopy;  Laterality: N/A;  1200   ESOPHAGOGASTRODUODENOSCOPY  03/10/2012   Procedure: ESOPHAGOGASTRODUODENOSCOPY (EGD);  Surgeon: Malissa Hippo, MD;  [facility];  Service: Endoscopy;  Laterality: N/A;  325   ESOPHAGOGASTRODUODENOSCOPY (EGD) WITH PROPOFOL N/A 04/01/2018   Procedure: ESOPHAGOGASTRODUODENOSCOPY (EGD) WITH PROPOFOL;  Surgeon: Malissa Hippo, MD;  [facility];  Service: Endoscopy;  Laterality: N/A;   EYE SURGERY     HARDWARE REMOVAL N/A 04/24/2014   Procedure: REMOVAL OF SACRAL INSTRUMENTATION WITH METREX;  Surgeon: Barnett Abu, MD;  [facility];  Service: Neurosurgery;  Laterality: N/A;   HEMORRHOIDECTOMY WITH HEMORRHOID BANDING     JOINT REPLACEMENT     KNEE ARTHROSCOPY Right    KNEE ARTHROSCOPY WITH MEDIAL MENISECTOMY Right 08/03/2018   Procedure: RIGHT KNEE ARTHROSCOPY WITH PARTIAL MEDIAL MENISCECTOMY;  Surgeon: Tarry Kos, MD;  Location: [facility];  Service: Orthopedics;  Laterality: Right;   KNEE SURGERY     left   LASIK     POLYPECTOMY  08/13/2016   Procedure: POLYPECTOMY;  Surgeon: Malissa Hippo, MD;  [facility];  Service: Endoscopy;;  colon   PVI  01/24/2010   afib ablation   TRANSFORAMINAL LUMBAR INTERBODY FUSION (TLIF) WITH PEDICLE SCREW FIXATION 1 LEVEL N/A 05/06/2021   Procedure: Transforaminal Lumbar Interbody Fusion  - Thoracic twelve-Lumbar one fixation Thoracic nine to Lumbar one with methacrylate augmentation with mazor;  Surgeon: Barnett Abu, MD;  [facility];  Service: Neurosurgery;  Laterality: N/A;    Current Outpatient Medications  Medication Sig Dispense Refill   amLODipine (NORVASC) 10 MG tablet TAKE 1 TABLET BY MOUTH EVERY DAY 90 tablet 1   ELIQUIS 5 MG TABS tablet TAKE 1 TABLET BY MOUTH TWICE A DAY. STOP PRADAXA 60 tablet 5    indapamide (LOZOL) 1.25 MG tablet Take 1 tablet (1.25 mg total) by mouth daily. 30 tablet 5   latanoprost (XALATAN) 0.005 % ophthalmic solution SMARTSIG:1 Drop(s) In Eye(s) Every Evening     metoprolol succinate (TOPROL-XL) 25 MG 24 hr tablet Take 1 tablet (25 mg total) by mouth 2 (two) times daily. 180 tablet 2   Oxycodone HCl 10 MG TABS Take 1 tablet (10 mg total) by mouth 4 (four) times daily as needed. 120 tablet 0   Oxycodone HCl 10 MG TABS 1 taken 4 times daily as needed for pain caution drowsiness 120 tablet 0   Oxycodone HCl 10 MG TABS 1 taken 4 times daily as needed for pain  caution drowsiness 120 tablet 0   pantoprazole (PROTONIX) 40 MG tablet TAKE 1 TABLET BY MOUTH DAILY BEFORE BREAKFAST 90 tablet 1   pravastatin (PRAVACHOL) 80 MG tablet TAKE 1 TABLET BY MOUTH EVERYDAY AT BEDTIME 90 tablet 1   psyllium (METAMUCIL SMOOTH TEXTURE) 58.6 % powder Take 1 packet by mouth at bedtime.     ramipril (ALTACE) 10 MG capsule TAKE 1 CAPSULE BY MOUTH TWICE A DAY 180 capsule 1   tamsulosin (FLOMAX) 0.4 MG CAPS capsule TAKE 1 CAPSULE BY MOUTH EVERY DAY 90 capsule 1   torsemide (DEMADEX) 10 MG tablet Take 1 tablet (10 mg total) by mouth daily. 30 tablet 5   ketorolac (ACULAR) 0.5 % ophthalmic solution Place 1 drop into the left eye 2 (two) times daily. (Patient not taking: Reported on 10/01/2022)     XDEMVY 0.25 % SOLN Apply to eye. (Patient not taking: Reported on 10/01/2022)     No current facility-administered medications for this visit.    Allergies as of 10/01/2022 - Review Complete 10/01/2022  Allergen Reaction Noted   Sectral [acebutolol hcl] Other (See Comments) 09/14/2012   Sulfonamide derivatives Other (See Comments)    Zocor [simvastatin] Other (See Comments) 09/14/2012    Family History  Problem Relation Age of Onset   Hypertension Mother    Transient ischemic attack Mother    Heart disease Father    Diabetes Brother    Healthy Daughter    Colon cancer Sister    Hypertension Other     Heart disease Other     Social History   Socioeconomic History   Marital status: Married    Spouse name: Not on file   Number of children: Not on file   Years of education: Not on file   Highest education level: Not on file  Occupational History   Occupation: retired  Tobacco Use   Smoking status: Former    Packs/day: 0.25    Years: 3.00    Additional pack years: 0.00    Total pack years: 0.75    Types: Cigarettes    Quit date: 03/30/1969    Years since quitting: 53.5    Passive exposure: Past   Smokeless tobacco: Never  Vaping Use   Vaping Use: Never used  Substance and Sexual Activity   Alcohol use: Yes    Alcohol/week: 14.0 standard drinks of alcohol    Types: 7 Glasses of wine, 7 Shots of liquor per week    Comment: Jack nightly   Drug use: No   Sexual activity: Yes  Other Topics Concern   Not on file  Social History Narrative   Not on file   Social Determinants of Health   Financial Resource Strain: Low Risk  (11/12/2020)   Overall Financial Resource Strain (CARDIA)    Difficulty of Paying Living Expenses: Not hard at all  Food Insecurity: No Food Insecurity (11/25/2021)   Hunger Vital Sign    Worried About Running Out of Food in the Last Year: Never true    Ran Out of Food in the Last Year: Never true  Transportation Needs: No Transportation Needs (11/25/2021)   PRAPARE - Administrator, Civil Service (Medical): No    Lack of Transportation (Non-Medical): No  Physical Activity: Sufficiently Active (11/25/2021)   Exercise Vital Sign    Days of Exercise per Week: 5 days    Minutes of Exercise per Session: 30 min  Stress: No Stress Concern Present (11/25/2021)   [employer] of Occupational  Health - Occupational Stress Questionnaire    Feeling of Stress : Not at all  Social Connections: Socially Integrated (11/25/2021)   Social Connection and Isolation Panel [NHANES]    Frequency of Communication with Friends and Family: More than three  times a week    Frequency of Social Gatherings with Friends and Family: More than three times a week    Attends Religious Services: More than 4 times per year    Active Member of [employer] or Organizations: Yes    Attends Engineer, structural: More than 4 times per year    Marital Status: Married   Review of systems General: negative for malaise, night sweats, fever, chills, weight loss Neck: Negative for lumps, goiter, pain and significant neck swelling Resp: Negative for cough, wheezing, dyspnea at rest CV: Negative for chest pain, leg swelling, palpitations, orthopnea GI: denies melena, hematochezia, nausea, vomiting, diarrhea, constipation, dysphagia, odyonophagia, early satiety or unintentional weight loss.  MSK: Negative for joint pain or swelling, back pain, and muscle pain. Derm: Negative for itching or rash Psych: Denies depression, anxiety, memory loss, confusion. No homicidal or suicidal ideation.  Heme: Negative for prolonged bleeding, bruising easily, and swollen nodes. Endocrine: Negative for cold or heat intolerance, polyuria, polydipsia and goiter. Neuro: negative for tremor, gait imbalance, syncope and seizures. The remainder of the review of systems is noncontributory.  Physical Exam: BP 134/69 (BP Location: Left Arm, Patient Position: Sitting, Cuff Size: Large)   Pulse 65   Temp 98 F (36.7 C) (Oral)   Ht 5\' 6"  (1.676 m)   Wt 201 lb 14.4 oz (91.6 kg)   BMI 32.59 kg/m  General:   Alert and oriented. No distress noted. Pleasant and cooperative.  Head:  Normocephalic and atraumatic. Eyes:  Conjuctiva clear without scleral icterus. Mouth:  Oral mucosa pink and moist. Good dentition. No lesions. Heart: Normal rate and rhythm, s1 and s2 heart sounds present.  Lungs: Clear lung sounds in all lobes. Respirations equal and unlabored. Abdomen:  +BS, soft, non-tender and non-distended. No rebound or guarding. No HSM or masses noted. Derm: No palmar erythema or  jaundice Msk:  Symmetrical without gross deformities. Normal posture. Extremities:  Without edema. Neurologic:  Alert and  oriented x4 Psych:  Alert and cooperative. Normal mood and affect.  Invalid input(s): "6 MONTHS"   ASSESSMENT: ALDRED ESQUIBEL is a 82 y.o. male presenting today for follow up of GERD and constipation  GERD: he does well on protonix 40mg  daily. Denies breakthrough symptoms. Had tried previously to stop PPI but was symptomatic when he did. For now, will continue with current regimen.  Constipation: having a BM usually daily taking 1T metamucil nightly. No rectal bleeding or melena. Had an episode of severe lower abdominal pain/burning a few weeks back but this resolved spontaneously, he has had no further episodes, he will make me aware if this recurs.   We discussed further colonoscopy for surveillance of history of TAs. Last was in 2018. He notes complications post op during last surgery at the end of 2022, has had some recent cardiac/respiratory issues. He is unsure if he wants to have any further colonoscopies. We discussed that given his age and other comorbidities, risks may outweight the benefits in his case. I do not think it would be unreasonable to hold off on further evaluations, however, I encouraged him to discuss with his family and PCP and he can let me know if he wishes to proceed with colonoscopy.    PLAN:  Continue with metamucil QHS  2. Continue with protonix 40mg  daily   3. Pt to consider repeat Colonoscopy, will make me aware if he wishes to proceed  4. Pt to make me aware of further abdominal pain  All questions were answered, patient verbalized understanding and is in agreement with plan as outlined above.    Follow Up: 1 year   Trenell Moxey L. Jeanmarie Hubert, MSN, APRN, AGNP-C Adult-Gerontology Nurse Practitioner [facility]   I have reviewed the note and agree with the APP's assessment as described in this progress note  Katrinka Blazing, MD Gastroenterology and Hepatology [facility]

## 2022-10-06 ENCOUNTER — Other Ambulatory Visit: Payer: Self-pay | Admitting: Family Medicine

## 2022-10-06 DIAGNOSIS — K219 Gastro-esophageal reflux disease without esophagitis: Secondary | ICD-10-CM

## 2022-10-10 ENCOUNTER — Other Ambulatory Visit: Payer: Self-pay | Admitting: Family Medicine

## 2022-10-13 ENCOUNTER — Ambulatory Visit (HOSPITAL_COMMUNITY)
Admission: RE | Admit: 2022-10-13 | Discharge: 2022-10-13 | Disposition: A | Discharge status: Home / Self Care | Payer: PPO | Source: Ambulatory Visit | Attending: Family Medicine | Admitting: Family Medicine

## 2022-10-13 ENCOUNTER — Ambulatory Visit (INDEPENDENT_AMBULATORY_CARE_PROVIDER_SITE_OTHER): Payer: PPO | Admitting: Family Medicine

## 2022-10-13 ENCOUNTER — Encounter: Payer: Self-pay | Admitting: Family Medicine

## 2022-10-13 VITALS — BP 136/78 | HR 72 | Wt 202.2 lb

## 2022-10-13 DIAGNOSIS — R3916 Straining to void: Secondary | ICD-10-CM

## 2022-10-13 DIAGNOSIS — G8929 Other chronic pain: Secondary | ICD-10-CM

## 2022-10-13 DIAGNOSIS — R0609 Other forms of dyspnea: Secondary | ICD-10-CM | POA: Diagnosis not present

## 2022-10-13 DIAGNOSIS — R7989 Other specified abnormal findings of blood chemistry: Secondary | ICD-10-CM | POA: Diagnosis not present

## 2022-10-13 DIAGNOSIS — R635 Abnormal weight gain: Secondary | ICD-10-CM | POA: Diagnosis not present

## 2022-10-13 DIAGNOSIS — Z79891 Long term (current) use of opiate analgesic: Secondary | ICD-10-CM

## 2022-10-13 DIAGNOSIS — M549 Dorsalgia, unspecified: Secondary | ICD-10-CM | POA: Diagnosis not present

## 2022-10-13 DIAGNOSIS — R0600 Dyspnea, unspecified: Secondary | ICD-10-CM | POA: Diagnosis not present

## 2022-10-13 DIAGNOSIS — N401 Enlarged prostate with lower urinary tract symptoms: Secondary | ICD-10-CM

## 2022-10-13 DIAGNOSIS — R5383 Other fatigue: Secondary | ICD-10-CM

## 2022-10-13 MED ORDER — OXYCODONE HCL 10 MG PO TABS: ORAL_TABLET | ORAL | 0 refills | Status: DC

## 2022-10-13 MED ORDER — RAMIPRIL 10 MG PO CAPS: 10.0000 mg | ORAL_CAPSULE | Freq: Two times a day (BID) | ORAL | 1 refills | Status: DC

## 2022-10-13 MED ORDER — TAMSULOSIN HCL 0.4 MG PO CAPS: 0.4000 mg | ORAL_CAPSULE | Freq: Every day | ORAL | 1 refills | Status: DC

## 2022-10-13 MED ORDER — INDAPAMIDE 1.25 MG PO TABS: 1.2500 mg | ORAL_TABLET | Freq: Every day | ORAL | 5 refills | Status: DC

## 2022-10-13 NOTE — Progress Notes (Signed)
Subjective:    Patient ID: [name], male    DOB: Oct 11, 1940, 82 y.o.   MRN: [id number]  HPI Patient relates that he finds himself feeling down and discouraged because of his chronic pain and inability to do the activities he would like to do.  He finds himself feeling very fatigued and tired.  Also relates gets out of breath easily with activity saw cardiologist recently who basically did follow-up on his underlying health issues and told him they seem to be holding their own  Chronic back pain greater than 3 months duration  Encounter for long-term use of opiate analgesic  DOE (dyspnea on exertion) - Plan: DG Chest 2 View, Brain natriuretic peptide, CBC with Differential  Benign prostatic hyperplasia (BPH) with straining on urination  Other fatigue - Plan: Basic Metabolic Panel (7)  Elevated brain natriuretic peptide (BNP) level - Plan: Brain natriuretic peptide, Basic Metabolic Panel (7)  Weight gain  He did state that he stopped taking tamsulosin because he thought it could be causing some of his problems but then he noted that he could not urinate well so he started back on taking it  Patient arrives today for 3 month follow up. This patient was seen today for chronic pain  The medication list was reviewed and updated.   Location of Pain for which the patient has been treated with regarding narcotics: He has had significant low back pain and discomfort previous surgeries failed getting significant relief with surgery.  But he states recently his hip pain in his left hip is improved he attributes this to faith and prayer He does take his pain medicines  Onset of this pain: Present for years   -Compliance with medication: Good clients with medicine  - Number patient states they take daily: 4/day  -Reason for ongoing use of opioids patient has low back pain discomfort.  Previous surgery.  Has tried and failed physical therapy Tylenol injections and surgery.  Has been on  pain medicine for years.  Under pain management contract as well as yearly urine drug screen and PDMP monitoring  What other measures have been tried outside of opioids has tried Tylenol, NSAIDs, injections, surgery, physical therapy none of these have helped.  Patient because of the atrial fibrillation and being on blood thinner cannot be on NSAIDs  In the ongoing specialists regarding this condition sees back specialist intermittently when he needs them  -when was the last dose patient took? 0700 this morning  The patient was advised the importance of maintaining medication and not using illegal substances with these.  Here for refills and follow up  The patient was educated that we can provide 3 monthly scripts for their medication, it is their responsibility to follow the instructions.  Side effects or complications from medications: Denies side effects  Patient is aware that pain medications are meant to minimize the severity of the pain to allow their pain levels to improve to allow for better function. They are aware of that pain medications cannot totally remove their pain.  Due for UDT ( at least once per year) (pain management contract is also completed at the time of the UDT): 01/12/2022  Scale of 1 to 10 ( 1 is least 10 is most) Your pain level without the medicine: 8 Your pain level with medication 5  Scale 1 to 10 ( 1-helps very little, 10 helps very well) How well does your pain medication reduce your pain so you can function better through  out the day? 7  Quality of the pain: Throbbing aching  Persistence of the pain: Present all the time  Modifying factors: Worse with activity       Review of Systems     Objective:   Physical Exam General-in no acute distress Eyes-no discharge Lungs-respiratory rate normal, CTA CV-no murmurs,RRR Extremities skin warm dry no edema Neuro grossly normal Behavior normal, alert        Assessment & Plan:   1. Chronic  back pain greater than 3 months duration Patient to continue current measures.  In addition to this stretching has seen specialist before.  2. Encounter for long-term use of opiate analgesic-please see discussion above Patient under pain management contract, does do yearly urine drug screen as well as PDMP and every 34-month visits Pain medicine he takes approximately 4/day he states it does take the edge off his pain he has been on his medicine for years he is compliant with his medicine the surgeon basically has told him no further surgeries would be recommended  He is under pain management contract as well as yearly urine drug screens and we do checked the PDMP The patient was seen in followup for chronic pain. A review over at their current pain status was discussed. Drug registry was checked. Prescriptions were given.  Regular follow-up recommended. Discussion was held regarding the importance of compliance with medication as well as pain medication contract.  Patient was informed that medication may cause drowsiness and should not be combined  with other medications/alcohol or street drugs. If the patient feels medication is causing altered alertness then do not drive or operate dangerous equipment.  Should be noted that the patient appears to be meeting appropriate use of opioids and response.  Evidenced by improved function and decent pain control without significant side effects and no evidence of overt aberrancy issues.  Upon discussion with the patient today they understand that opioid therapy is optional and they feel that the pain has been refractory to reasonable conservative measures and is significant and affecting quality of life enough to warrant ongoing therapy and wishes to continue opioids.  Refills were provided.   3. DOE (dyspnea on exertion) Chest x-ray ordered.  I believe this more likely cardiac related.  Patient relates that he saw the cardiologist recently check lab work  await results - DG Chest 2 View - CBC with Differential  4. Benign prostatic hyperplasia (BPH) with straining on urination Continue tamsulosin he states it does help when he stops that he cannot urinate well  5. Other fatigue Check lab work await results - Basic Metabolic Panel (7)  6. Elevated brain natriuretic peptide (BNP) level Check lab work await results, will also touch base with results with cardiology - Brain natriuretic peptide - Basic Metabolic Panel (7)  7. Weight gain Portion control regular physical activity as best as possible  Follow-up approximately 3 months

## 2022-10-14 LAB — BASIC METABOLIC PANEL (7)
Sodium: 140 mmol/L (ref 134–144)
eGFR: 67 mL/min/{1.73_m2} (ref >59)

## 2022-10-14 LAB — CBC WITH DIFFERENTIAL/PLATELET
Basophils Absolute: 0.1 10*3/uL (ref 0.0–0.2)
Eos: 3 %
Immature Granulocytes: 1 %
Lymphocytes Absolute: 1.6 10*3/uL (ref 0.7–3.1)
Lymphs: 27 %
Neutrophils Absolute: 3.3 10*3/uL (ref 1.4–7.0)
Neutrophils: 57 %

## 2022-10-15 LAB — BASIC METABOLIC PANEL (7)
BUN/Creatinine Ratio: 18 (ref 10–24)
BUN: 20 mg/dL (ref 8–27)
CO2: 25 mmol/L (ref 20–29)
Chloride: 101 mmol/L (ref 96–106)
Creatinine, Ser: 1.1 mg/dL (ref 0.76–1.27)
Glucose: 96 mg/dL (ref 70–99)
Potassium: 4.4 mmol/L (ref 3.5–5.2)

## 2022-10-15 LAB — CBC WITH DIFFERENTIAL/PLATELET
Basos: 1 %
EOS (ABSOLUTE): 0.2 10*3/uL (ref 0.0–0.4)
Hematocrit: 43.9 % (ref 37.5–51.0)
Hemoglobin: 14.2 g/dL (ref 13.0–17.7)
Immature Grans (Abs): 0 10*3/uL (ref 0.0–0.1)
MCH: 30.9 pg (ref 26.6–33.0)
MCHC: 32.3 g/dL (ref 31.5–35.7)
MCV: 96 fL (ref 79–97)
Monocytes Absolute: 0.7 10*3/uL (ref 0.1–0.9)
Monocytes: 11 %
Platelets: 244 10*3/uL (ref 150–450)
RBC: 4.59 x10E6/uL (ref 4.14–5.80)
RDW: 13.5 % (ref 11.6–15.4)
WBC: 5.9 10*3/uL (ref 3.4–10.8)

## 2022-10-15 LAB — BRAIN NATRIURETIC PEPTIDE: BNP: 139.1 pg/mL — ABNORMAL HIGH (ref 0.0–100.0)

## 2022-10-22 ENCOUNTER — Telehealth: Payer: Self-pay | Admitting: Internal Medicine

## 2022-10-22 MED ORDER — METOPROLOL SUCCINATE ER 25 MG PO TB24: 25.0000 mg | ORAL_TABLET | Freq: Two times a day (BID) | ORAL | 2 refills | Status: DC

## 2022-10-22 NOTE — Telephone Encounter (Signed)
Patient is out metoprolol 25mg  [facility].  He is asking if he needs this medication. Esmond Camper wrote the prescription originally

## 2022-10-22 NOTE — Telephone Encounter (Signed)
Medication refill sent to pharmacy- per last ov with provider in 07/2022, pt to continue Metoprolol 25 mg BID.   Recall placed for pt- 1y f/u VM

## 2022-10-26 ENCOUNTER — Inpatient Hospital Stay (HOSPITAL_COMMUNITY)
Admission: EM | Admit: 2022-10-26 | Discharge: 2022-10-29 | DRG: 390 | Disposition: A | Discharge status: Home / Self Care | Payer: PPO | Attending: Internal Medicine | Admitting: Internal Medicine

## 2022-10-26 ENCOUNTER — Emergency Department (HOSPITAL_COMMUNITY): Payer: PPO

## 2022-10-26 ENCOUNTER — Other Ambulatory Visit: Payer: Self-pay

## 2022-10-26 DIAGNOSIS — K219 Gastro-esophageal reflux disease without esophagitis: Secondary | ICD-10-CM | POA: Diagnosis present

## 2022-10-26 DIAGNOSIS — H269 Unspecified cataract: Secondary | ICD-10-CM | POA: Diagnosis not present

## 2022-10-26 DIAGNOSIS — Z981 Arthrodesis status: Secondary | ICD-10-CM

## 2022-10-26 DIAGNOSIS — I4891 Unspecified atrial fibrillation: Secondary | ICD-10-CM | POA: Diagnosis present

## 2022-10-26 DIAGNOSIS — Z86718 Personal history of other venous thrombosis and embolism: Secondary | ICD-10-CM | POA: Diagnosis not present

## 2022-10-26 DIAGNOSIS — Z86010 Personal history of colonic polyps: Secondary | ICD-10-CM

## 2022-10-26 DIAGNOSIS — Z833 Family history of diabetes mellitus: Secondary | ICD-10-CM

## 2022-10-26 DIAGNOSIS — Z87891 Personal history of nicotine dependence: Secondary | ICD-10-CM | POA: Diagnosis not present

## 2022-10-26 DIAGNOSIS — Z888 Allergy status to other drugs, medicaments and biological substances status: Secondary | ICD-10-CM

## 2022-10-26 DIAGNOSIS — Z4659 Encounter for fitting and adjustment of other gastrointestinal appliance and device: Secondary | ICD-10-CM | POA: Diagnosis not present

## 2022-10-26 DIAGNOSIS — Z882 Allergy status to sulfonamides status: Secondary | ICD-10-CM | POA: Diagnosis not present

## 2022-10-26 DIAGNOSIS — H409 Unspecified glaucoma: Secondary | ICD-10-CM | POA: Diagnosis not present

## 2022-10-26 DIAGNOSIS — M1990 Unspecified osteoarthritis, unspecified site: Secondary | ICD-10-CM | POA: Diagnosis present

## 2022-10-26 DIAGNOSIS — I480 Paroxysmal atrial fibrillation: Secondary | ICD-10-CM | POA: Diagnosis not present

## 2022-10-26 DIAGNOSIS — E7849 Other hyperlipidemia: Secondary | ICD-10-CM | POA: Diagnosis not present

## 2022-10-26 DIAGNOSIS — K56609 Unspecified intestinal obstruction, unspecified as to partial versus complete obstruction: Principal | ICD-10-CM | POA: Diagnosis present

## 2022-10-26 DIAGNOSIS — I10 Essential (primary) hypertension: Secondary | ICD-10-CM | POA: Diagnosis not present

## 2022-10-26 DIAGNOSIS — Z800 Family history of malignant neoplasm of digestive organs: Secondary | ICD-10-CM

## 2022-10-26 DIAGNOSIS — Z8614 Personal history of Methicillin resistant Staphylococcus aureus infection: Secondary | ICD-10-CM | POA: Diagnosis not present

## 2022-10-26 DIAGNOSIS — Z86711 Personal history of pulmonary embolism: Secondary | ICD-10-CM | POA: Diagnosis not present

## 2022-10-26 DIAGNOSIS — Z79899 Other long term (current) drug therapy: Secondary | ICD-10-CM

## 2022-10-26 DIAGNOSIS — Z7902 Long term (current) use of antithrombotics/antiplatelets: Secondary | ICD-10-CM | POA: Diagnosis not present

## 2022-10-26 DIAGNOSIS — G8929 Other chronic pain: Secondary | ICD-10-CM | POA: Diagnosis not present

## 2022-10-26 DIAGNOSIS — M549 Dorsalgia, unspecified: Secondary | ICD-10-CM | POA: Diagnosis present

## 2022-10-26 DIAGNOSIS — Z7901 Long term (current) use of anticoagulants: Secondary | ICD-10-CM

## 2022-10-26 DIAGNOSIS — E876 Hypokalemia: Secondary | ICD-10-CM | POA: Diagnosis not present

## 2022-10-26 DIAGNOSIS — K6389 Other specified diseases of intestine: Secondary | ICD-10-CM | POA: Diagnosis not present

## 2022-10-26 DIAGNOSIS — I4819 Other persistent atrial fibrillation: Secondary | ICD-10-CM | POA: Diagnosis present

## 2022-10-26 DIAGNOSIS — Z8249 Family history of ischemic heart disease and other diseases of the circulatory system: Secondary | ICD-10-CM

## 2022-10-26 DIAGNOSIS — E785 Hyperlipidemia, unspecified: Secondary | ICD-10-CM | POA: Diagnosis present

## 2022-10-26 LAB — CBC
HCT: 45.7 % (ref 39.0–52.0)
Hemoglobin: 15.4 g/dL (ref 13.0–17.0)
MCH: 31.4 pg (ref 26.0–34.0)
MCHC: 33.7 g/dL (ref 30.0–36.0)
MCV: 93.1 fL (ref 80.0–100.0)
Platelets: 270 10*3/uL (ref 150–400)
RBC: 4.91 MIL/uL (ref 4.22–5.81)
RDW: 13.7 % (ref 11.5–15.5)
WBC: 10.1 10*3/uL (ref 4.0–10.5)
nRBC: 0 % (ref 0.0–0.2)

## 2022-10-26 LAB — COMPREHENSIVE METABOLIC PANEL
ALT: 24 U/L (ref 0–44)
AST: 28 U/L (ref 15–41)
Albumin: 4.8 g/dL (ref 3.5–5.0)
Alkaline Phosphatase: 80 U/L (ref 38–126)
Anion gap: 14 (ref 5–15)
BUN: 21 mg/dL (ref 8–23)
CO2: 26 mmol/L (ref 22–32)
Calcium: 9.6 mg/dL (ref 8.9–10.3)
Chloride: 96 mmol/L — ABNORMAL LOW (ref 98–111)
Creatinine, Ser: 1.08 mg/dL (ref 0.61–1.24)
GFR, Estimated: >60 mL/min (ref >60)
Glucose, Bld: 196 mg/dL — ABNORMAL HIGH (ref 70–99)
Potassium: 3.8 mmol/L (ref 3.5–5.1)
Sodium: 136 mmol/L (ref 135–145)
Total Bilirubin: 1.1 mg/dL (ref 0.3–1.2)
Total Protein: 8.2 g/dL — ABNORMAL HIGH (ref 6.5–8.1)

## 2022-10-26 LAB — LIPASE, BLOOD: Lipase: 38 U/L (ref 11–51)

## 2022-10-26 MED ORDER — IOHEXOL 300 MG/ML  SOLN
100.0000 mL | Freq: Once | INTRAMUSCULAR | Status: AC | PRN
  Administered 2022-10-26: 100 mL via INTRAVENOUS

## 2022-10-26 MED ORDER — ONDANSETRON HCL 4 MG/2ML IJ SOLN
4.0000 mg | Freq: Once | INTRAMUSCULAR | Status: AC | PRN
  Administered 2022-10-26: 4 mg via INTRAVENOUS
  Filled 2022-10-26: qty 2

## 2022-10-26 MED ORDER — ONDANSETRON HCL 4 MG/2ML IJ SOLN
4.0000 mg | Freq: Once | INTRAMUSCULAR | Status: AC
  Administered 2022-10-26: 4 mg via INTRAVENOUS
  Filled 2022-10-26: qty 2

## 2022-10-26 MED ORDER — SODIUM CHLORIDE 0.9 % IV SOLN: INTRAVENOUS | Status: AC

## 2022-10-26 MED ORDER — SODIUM CHLORIDE 0.9 % IV BOLUS
500.0000 mL | Freq: Once | INTRAVENOUS | Status: AC
  Administered 2022-10-26: 500 mL via INTRAVENOUS

## 2022-10-26 MED ORDER — ONDANSETRON HCL 4 MG/2ML IJ SOLN: 4.0000 mg | Freq: Four times a day (QID) | INTRAMUSCULAR | Status: DC | PRN

## 2022-10-26 MED ORDER — MORPHINE SULFATE (PF) 2 MG/ML IV SOLN
2.0000 mg | Freq: Once | INTRAVENOUS | Status: AC
  Administered 2022-10-26: 2 mg via INTRAVENOUS
  Filled 2022-10-26: qty 1

## 2022-10-26 MED ORDER — ONDANSETRON HCL 4 MG PO TABS: 4.0000 mg | ORAL_TABLET | Freq: Four times a day (QID) | ORAL | Status: DC | PRN

## 2022-10-26 MED ORDER — HYDROMORPHONE HCL 1 MG/ML IJ SOLN
0.5000 mg | Freq: Once | INTRAMUSCULAR | Status: AC
  Administered 2022-10-26: 0.5 mg via INTRAVENOUS
  Filled 2022-10-26: qty 0.5

## 2022-10-26 MED ORDER — MORPHINE SULFATE (PF) 2 MG/ML IV SOLN
2.0000 mg | INTRAVENOUS | Status: DC | PRN
  Administered 2022-10-26 – 2022-10-28 (×2): 2 mg via INTRAVENOUS
  Filled 2022-10-26 (×3): qty 1

## 2022-10-26 NOTE — ED Notes (Addendum)
Patient requests nausea and pain medicine. MD notified

## 2022-10-26 NOTE — ED Triage Notes (Signed)
Pt presents with abdominal pain nausea and vomiting with abdominal pain.  Daughter states she was seen yesterday for same.  No diarrhea.

## 2022-10-26 NOTE — ED Notes (Signed)
ED TO INPATIENT HANDOFF REPORT  ED Nurse Name and Phone #: Toni Amend [phone]  S Name/Age/Gender [name] 82 y.o. male Room/Bed: APA12/APA12  Code Status   Code Status: Full Code  Home/SNF/Other Home Patient oriented to: self, place, time, and situation Is this baseline? Yes   Triage Complete: Triage complete  Chief Complaint SBO (small bowel obstruction) (HCC) [K56.609]  Triage Note Pt presents with abdominal pain nausea and vomiting with abdominal pain.  Daughter states she was seen yesterday for same.  No diarrhea.     Allergies Allergies  Allergen Reactions   Sectral [Acebutolol Hcl] Other (See Comments)    Unknown reaction   Sulfonamide Derivatives Other (See Comments)    Runs blood pressure up   Zocor [Simvastatin] Other (See Comments)    Joint pain    Level of Care/Admitting Diagnosis ED Disposition     ED Disposition  Admit   Condition  --   Comment  Hospital Area: [facility] [[id number]]  Level of Care: Telemetry [5]  Covid Evaluation: Asymptomatic - no recent exposure (last 10 days) testing not required  Diagnosis: SBO (small bowel obstruction) [facility]) [[id number]]  Admitting Physician: Lilyan Gilford [[id number]]  Attending Physician: Lilyan Gilford [[id number]]  Certification:: I certify this patient will need inpatient services for at least 2 midnights  Estimated Length of Stay: 2          B Medical/Surgery History Past Medical History:  Diagnosis Date   Arthritis    Cancer (HCC)    skin   Carpal tunnel syndrome    Cataract    left eye   Chronic back pain    multiple back surgeries   Complication of anesthesia    pt states woke up during hemorroid surgery   DVT (deep venous thrombosis) (HCC) 03/2007   left leg    Dysrhythmia    GERD (gastroesophageal reflux disease)    takes Protonix daily   Glaucoma    History of colon polyps    History of hyperglycemia    History of MRSA infection 1992   HTN (hypertension)     takes  Ramipril daily   Hyperlipidemia    takes Pravastatin 3 times a week   Nocturia    Paroxysmal atrial fibrillation (HCC)    takes Pradaxa daily, Chads2vasc score 2(07/11/14)   Pneumonia    many yrs ago   Pulmonary embolism (HCC) 2008   Past Surgical History:  Procedure Laterality Date   APPLICATION OF ROBOTIC ASSISTANCE FOR SPINAL PROCEDURE  05/06/2021   Procedure: APPLICATION OF ROBOTIC ASSISTANCE FOR SPINAL PROCEDURE;  Surgeon: Barnett Abu, MD;  Location: [facility] OR;  Service: Neurosurgery;;   BACK SURGERY     BIOPSY  04/01/2018   Procedure: BIOPSY;  Surgeon: Malissa Hippo, MD;  Location: [facility];  Service: Endoscopy;;  gastric   CARDIAC CATHETERIZATION  05/18/2006   CARPAL TUNNEL RELEASE Right    cataract surgery Right    cervical and lumbar surgery     COLONOSCOPY  04/24/2011   Procedure: COLONOSCOPY;  Surgeon: Malissa Hippo, MD;  Location: [facility];  Service: Endoscopy;  Laterality: N/A;  1200   COLONOSCOPY N/A 08/13/2016   Procedure: COLONOSCOPY;  Surgeon: Malissa Hippo, MD;  [facility];  Service: Endoscopy;  Laterality: N/A;  1200   ESOPHAGOGASTRODUODENOSCOPY  03/10/2012   Procedure: ESOPHAGOGASTRODUODENOSCOPY (EGD);  Surgeon: Malissa Hippo, MD;  [facility];  Service: Endoscopy;  Laterality: N/A;  325  ESOPHAGOGASTRODUODENOSCOPY (EGD) WITH PROPOFOL N/A 04/01/2018   Procedure: ESOPHAGOGASTRODUODENOSCOPY (EGD) WITH PROPOFOL;  Surgeon: Malissa Hippo, MD;  [facility];  Service: Endoscopy;  Laterality: N/A;   EYE SURGERY     HARDWARE REMOVAL N/A 04/24/2014   Procedure: REMOVAL OF SACRAL INSTRUMENTATION WITH METREX;  Surgeon: Barnett Abu, MD;  Location: [facility];  Service: Neurosurgery;  Laterality: N/A;   HEMORRHOIDECTOMY WITH HEMORRHOID BANDING     JOINT REPLACEMENT     KNEE ARTHROSCOPY Right    KNEE ARTHROSCOPY WITH MEDIAL MENISECTOMY Right 08/03/2018   Procedure: RIGHT KNEE ARTHROSCOPY WITH PARTIAL MEDIAL  MENISCECTOMY;  Surgeon: Tarry Kos, MD;  Location: [facility];  Service: Orthopedics;  Laterality: Right;   KNEE SURGERY     left   LASIK     POLYPECTOMY  08/13/2016   Procedure: POLYPECTOMY;  Surgeon: Malissa Hippo, MD;  [facility];  Service: Endoscopy;;  colon   PVI  01/24/2010   afib ablation   TRANSFORAMINAL LUMBAR INTERBODY FUSION (TLIF) WITH PEDICLE SCREW FIXATION 1 LEVEL N/A 05/06/2021   Procedure: Transforaminal Lumbar Interbody Fusion  - Thoracic twelve-Lumbar one fixation Thoracic nine to Lumbar one with methacrylate augmentation with mazor;  Surgeon: Barnett Abu, MD;  Location: [facility] OR;  Service: Neurosurgery;  Laterality: N/A;     A IV Location/Drains/Wounds Patient Lines/Drains/Airways Status     Active Line/Drains/Airways     Name Placement date Placement time Site Days   Peripheral IV 10/26/22 20 G 1" Distal;Left;Upper Antecubital 10/26/22  1753  Antecubital  less than 1   NG/OG Vented/Dual Lumen 18 Fr. Left nare 57 cm 10/26/22  2013  Left nare  less than 1            Intake/Output Last 24 hours  Intake/Output Summary (Last 24 hours) at 10/26/2022 2104 Last data filed at 10/26/2022 2100 Gross per 24 hour  Intake --  Output 2900 ml  Net -2900 ml    Labs/Imaging Results for orders placed or performed during the hospital encounter of 10/26/22 (from the past 48 hour(s))  Lipase, blood     Status: None   Collection Time: 10/26/22  5:54 PM  Result Value Ref Range   Lipase 38 11 - 51 U/L    Comment: Performed at [facility], [address], [place], [place] [address]  Comprehensive metabolic panel     Status: Abnormal   Collection Time: 10/26/22  5:54 PM  Result Value Ref Range   Sodium 136 135 - 145 mmol/L   Potassium 3.8 3.5 - 5.1 mmol/L   Chloride 96 (L) 98 - 111 mmol/L   CO2 26 22 - 32 mmol/L   Glucose, Bld 196 (H) 70 - 99 mg/dL    Comment: Glucose reference range applies only to samples taken after fasting for at least 8  hours.   BUN 21 8 - 23 mg/dL   Creatinine, Ser 6.04 0.61 - 1.24 mg/dL   Calcium 9.6 8.9 - 54.0 mg/dL   Total Protein 8.2 (H) 6.5 - 8.1 g/dL   Albumin 4.8 3.5 - 5.0 g/dL   AST 28 15 - 41 U/L   ALT 24 0 - 44 U/L   Alkaline Phosphatase 80 38 - 126 U/L   Total Bilirubin 1.1 0.3 - 1.2 mg/dL   GFR, Estimated >98 >11 mL/min    Comment: (NOTE) Calculated using the CKD-EPI Creatinine Equation (2021)    Anion gap 14 5 - 15    Comment: Performed  at [facility], [address] [place], [place] [address]  CBC     Status: None   Collection Time: 10/26/22  5:54 PM  Result Value Ref Range   WBC 10.1 4.0 - 10.5 K/uL   RBC 4.91 4.22 - 5.81 MIL/uL   Hemoglobin 15.4 13.0 - 17.0 g/dL   HCT 09.8 11.9 - 14.7 %   MCV 93.1 80.0 - 100.0 fL   MCH 31.4 26.0 - 34.0 pg   MCHC 33.7 30.0 - 36.0 g/dL   RDW 82.9 56.2 - 13.0 %   Platelets 270 150 - 400 K/uL   nRBC 0.0 0.0 - 0.2 %    Comment: Performed at [facility], [address] [place], [place] [address]   DG Abd Portable 1 View  Result Date: 10/26/2022 CLINICAL DATA:  NG tube placement EXAM: PORTABLE ABDOMEN - 1 VIEW COMPARISON:  05/11/2021 FINDINGS: Limited field of view for tube placement verification. An enteric tube is present, extending along the midline to the upper mid abdomen. This likely indicates location in the upper stomach. No acute consolidation suggested in the lung bases. IMPRESSION: Enteric tube tip projects over the upper mid abdomen consistent with location in the upper stomach. Electronically Signed   By: Burman Nieves M.D.   On: 10/26/2022 20:41   CT ABDOMEN PELVIS W CONTRAST  Result Date: 10/26/2022 CLINICAL DATA:  Abdominal pain, nausea, vomiting EXAM: CT ABDOMEN AND PELVIS WITH CONTRAST TECHNIQUE: Multidetector CT imaging of the abdomen and pelvis was performed using the standard protocol following bolus administration of intravenous contrast. RADIATION DOSE REDUCTION: This exam was performed according to the departmental  dose-optimization program which includes automated exposure control, adjustment of the mA and/or kV according to patient size and/or use of iterative reconstruction technique. CONTRAST:  OMNIPAQUE IOHEXOL 300 MG/ML  SOLN COMPARISON:  11/08/2020 FINDINGS: Lower chest: Small linear densities are seen in lower lung fields suggesting scarring or subsegmental atelectasis. Extensive coronary artery calcifications are seen. Hepatobiliary: No focal abnormalities are seen in liver. Gallbladder is not distended. Pancreas: No focal abnormalities are seen. Spleen: Unremarkable. Adrenals/Urinary Tract: Adrenals are unremarkable. There is no hydronephrosis. There are no renal or ureteral stones. There are few small low-density foci in the renal cortex on both sides measuring up to 9 mm in diameter suggesting renal cysts. Urinary bladder is not distended. Stomach/Bowel: There is fluid in the lumen of the thoracic esophagus. There is marked distention of stomach with air-fluid level. There is dilation of duodenum and proximal jejunal loops. Distal small-bowel loops are decompressed. Zone of transition appears to be in right mid abdomen. There is mixed attenuation material in the lumen of dilated small bowel loop close to the transition zone. Appendix is not dilated. There is no significant wall thickening in colon. Multiple diverticula are seen in colon without signs of focal acute diverticulitis. There is decompression of transverse and descending colon. Vascular/Lymphatic: Arterial calcifications are seen in aorta and its major branches. Reproductive: Prostate is enlarged. Other: There is no ascites or pneumoperitoneum. Small umbilical hernia containing fat is seen. Left inguinal hernia containing fat is seen. Musculoskeletal: There is surgical fusion from T9-L5 levels. IMPRESSION: There is marked distention of stomach with air-fluid level. There is fluid in the lumen of thoracic esophagus. There is abnormal dilation of  proximal small bowel loops with decompression of distal small bowel loops. Findings suggest high-grade partial small bowel obstruction with zone of transition probably in right mid abdomen. This may be caused by adhesions or internal  hernia. There is no hydronephrosis. Diverticulosis of colon without signs of focal diverticulitis. Aortic arteriosclerosis. Coronary artery calcifications are seen. Lumbar spondylosis. Postsurgical changes are noted in lower thoracic spine and lumbar spine. Possible small bilateral renal cysts.  Enlarged prostate. Other findings as described in the body of the report. Electronically Signed   By: Ernie Avena M.D.   On: 10/26/2022 19:40    Pending Labs [employer] (From admission, onward)     Start     Ordered   Signed and Held  Comprehensive metabolic panel  Tomorrow morning,   R        Signed and Held   Signed and Held  Magnesium  Tomorrow morning,   R        Signed and Held   Signed and Held  CBC with Differential/Platelet  Tomorrow morning,   R        Signed and Held            Vitals/Pain Today's Vitals   10/26/22 1830 10/26/22 1915 10/26/22 2000 10/26/22 2024  BP: (!) 160/91 (!) 139/95 134/81   Pulse: (!) 112 (!) 110 (!) 117   Resp:  19 20   Temp:  98.6 F (37 C)    TempSrc:  Oral    SpO2: 90% 94% 95%   PainSc:  8   5     Isolation Precautions No active isolations  Medications Medications  ondansetron (ZOFRAN) injection 4 mg (4 mg Intravenous Given 10/26/22 1753)  sodium chloride 0.9 % bolus 500 mL (0 mLs Intravenous Stopped 10/26/22 1854)  morphine (PF) 2 MG/ML injection 2 mg (2 mg Intravenous Given 10/26/22 1813)  iohexol (OMNIPAQUE) 300 MG/ML solution 100 mL (100 mLs Intravenous Contrast Given 10/26/22 1846)  ondansetron (ZOFRAN) injection 4 mg (4 mg Intravenous Given 10/26/22 1952)  HYDROmorphone (DILAUDID) injection 0.5 mg (0.5 mg Intravenous Given 10/26/22 1954)    Mobility walks     Focused  Assessments    R Recommendations: See Admitting Provider Note  Report given to:   Additional Notes: A&O; 61F NG to L nare

## 2022-10-26 NOTE — ED Provider Notes (Signed)
[facility]ERGENCY DEPARTMENT AT [facility] Provider Note   CSN: [id number] Arrival date & time: 10/26/22  1700     History {Add pertinent medical, surgical, social history, OB history to HPI:1} Chief Complaint  Patient presents with   Abdominal Pain    [name] is a 82 y.o. male.  Patient with a headache.  He states he has been nauseated with abdominal pain today.   Abdominal Pain      Home Medications Prior to Admission medications   Medication Sig Start Date End Date Taking? Authorizing Provider  amLODipine (NORVASC) 10 MG tablet TAKE 1 TABLET BY MOUTH EVERY DAY 10/08/22   Luking, Jonna Coup, MD  ELIQUIS 5 MG TABS tablet TAKE 1 TABLET BY MOUTH TWICE A DAY. STOP PRADAXA Patient not taking: Reported on 10/13/2022 09/11/22   Babs Sciara, MD  indapamide (LOZOL) 1.25 MG tablet Take 1 tablet (1.25 mg total) by mouth daily. 10/13/22   Babs Sciara, MD  ketorolac (ACULAR) 0.5 % ophthalmic solution Place 1 drop into the left eye 2 (two) times daily. 11/20/21   [provider]  latanoprost (XALATAN) 0.005 % ophthalmic solution SMARTSIG:1 Drop(s) In Eye(s) Every Evening 07/11/22   [provider]  metoprolol succinate (TOPROL-XL) 25 MG 24 hr tablet Take 1 tablet (25 mg total) by mouth 2 (two) times daily. 10/22/22 10/22/23  Mallipeddi, Vishnu P, MD  Oxycodone HCl 10 MG TABS 1 taken 4 times daily as needed for pain caution drowsiness 10/13/22   Babs Sciara, MD  Oxycodone HCl 10 MG TABS 1 taken 4 times daily as needed for pain caution drowsiness 10/13/22   Luking, Jonna Coup, MD  Oxycodone HCl 10 MG TABS Take 1 tablet (10 mg total) by mouth 4 (four) times daily as needed. 10/13/22   Babs Sciara, MD  pantoprazole (PROTONIX) 40 MG tablet TAKE 1 TABLET BY MOUTH EVERY DAY BEFORE BREAKFAST 10/08/22   Babs Sciara, MD  pravastatin (PRAVACHOL) 80 MG tablet TAKE 1 TABLET BY MOUTH EVERYDAY AT BEDTIME 10/08/22   Luking, Scott A, MD  psyllium (METAMUCIL SMOOTH  TEXTURE) 58.6 % powder Take 1 packet by mouth at bedtime. 01/14/21   Malissa Hippo, MD  ramipril (ALTACE) 10 MG capsule Take 1 capsule (10 mg total) by mouth 2 (two) times daily. 10/13/22   Babs Sciara, MD  tamsulosin (FLOMAX) 0.4 MG CAPS capsule Take 1 capsule (0.4 mg total) by mouth daily. 10/13/22   Babs Sciara, MD  torsemide (DEMADEX) 10 MG tablet Take 1 tablet (10 mg total) by mouth daily. Patient not taking: Reported on 10/13/2022 02/26/22   Babs Sciara, MD  XDEMVY 0.25 % SOLN Apply to eye. Patient not taking: Reported on 10/13/2022 06/10/22   [provider]      Allergies    Sectral [acebutolol hcl], Sulfonamide derivatives, and Zocor [simvastatin]    Review of Systems   Review of Systems  Gastrointestinal:  Positive for abdominal pain.    Physical Exam Updated Vital Signs BP 134/81 (BP Location: Right Arm)   Pulse (!) 117   Temp 98.6 F (37 C) (Oral)   Resp 20   SpO2 95%  Physical Exam  ED Results / Procedures / Treatments   Labs (all labs ordered are listed, but only abnormal results are displayed) Labs Reviewed  COMPREHENSIVE METABOLIC PANEL - Abnormal; Notable for the following components:      Result Value   Chloride 96 (*)  Glucose, Bld 196 (*)    Total Protein 8.2 (*)    All other components within normal limits  LIPASE, BLOOD  CBC    EKG None  Radiology CT ABDOMEN PELVIS W CONTRAST  Result Date: 10/26/2022 CLINICAL DATA:  Abdominal pain, nausea, vomiting EXAM: CT ABDOMEN AND PELVIS WITH CONTRAST TECHNIQUE: Multidetector CT imaging of the abdomen and pelvis was performed using the standard protocol following bolus administration of intravenous contrast. RADIATION DOSE REDUCTION: This exam was performed according to the departmental dose-optimization program which includes automated exposure control, adjustment of the mA and/or kV according to patient size and/or use of iterative reconstruction technique. CONTRAST:  OMNIPAQUE  IOHEXOL 300 MG/ML  SOLN COMPARISON:  11/08/2020 FINDINGS: Lower chest: Small linear densities are seen in lower lung fields suggesting scarring or subsegmental atelectasis. Extensive coronary artery calcifications are seen. Hepatobiliary: No focal abnormalities are seen in liver. Gallbladder is not distended. Pancreas: No focal abnormalities are seen. Spleen: Unremarkable. Adrenals/Urinary Tract: Adrenals are unremarkable. There is no hydronephrosis. There are no renal or ureteral stones. There are few small low-density foci in the renal cortex on both sides measuring up to 9 mm in diameter suggesting renal cysts. Urinary bladder is not distended. Stomach/Bowel: There is fluid in the lumen of the thoracic esophagus. There is marked distention of stomach with air-fluid level. There is dilation of duodenum and proximal jejunal loops. Distal small-bowel loops are decompressed. Zone of transition appears to be in right mid abdomen. There is mixed attenuation material in the lumen of dilated small bowel loop close to the transition zone. Appendix is not dilated. There is no significant wall thickening in colon. Multiple diverticula are seen in colon without signs of focal acute diverticulitis. There is decompression of transverse and descending colon. Vascular/Lymphatic: Arterial calcifications are seen in aorta and its major branches. Reproductive: Prostate is enlarged. Other: There is no ascites or pneumoperitoneum. Small umbilical hernia containing fat is seen. Left inguinal hernia containing fat is seen. Musculoskeletal: There is surgical fusion from T9-L5 levels. IMPRESSION: There is marked distention of stomach with air-fluid level. There is fluid in the lumen of thoracic esophagus. There is abnormal dilation of proximal small bowel loops with decompression of distal small bowel loops. Findings suggest high-grade partial small bowel obstruction with zone of transition probably in right mid abdomen. This may be  caused by adhesions or internal hernia. There is no hydronephrosis. Diverticulosis of colon without signs of focal diverticulitis. Aortic arteriosclerosis. Coronary artery calcifications are seen. Lumbar spondylosis. Postsurgical changes are noted in lower thoracic spine and lumbar spine. Possible small bilateral renal cysts.  Enlarged prostate. Other findings as described in the body of the report. Electronically Signed   By: Ernie Avena M.D.   On: 10/26/2022 19:40    Procedures Procedures  {Document cardiac monitor, telemetry assessment procedure when appropriate:1}  Medications Ordered in ED Medications  ondansetron (ZOFRAN) injection 4 mg (4 mg Intravenous Given 10/26/22 1753)  sodium chloride 0.9 % bolus 500 mL (0 mLs Intravenous Stopped 10/26/22 1854)  morphine (PF) 2 MG/ML injection 2 mg (2 mg Intravenous Given 10/26/22 1813)  iohexol (OMNIPAQUE) 300 MG/ML solution 100 mL (100 mLs Intravenous Contrast Given 10/26/22 1846)  ondansetron (ZOFRAN) injection 4 mg (4 mg Intravenous Given 10/26/22 1952)  HYDROmorphone (DILAUDID) injection 0.5 mg (0.5 mg Intravenous Given 10/26/22 1954)    ED Course/ Medical Decision Making/ A&P    CRITICAL CARE Performed by: Bethann Berkshire Total critical care time: 40 minutes Critical care time was  exclusive of separately billable procedures and treating other patients. Critical care was necessary to treat or prevent imminent or life-threatening deterioration. Critical care was time spent personally by me on the following activities: development of treatment plan with patient and/or surrogate as well as nursing, discussions with consultants, evaluation of patient's response to treatment, examination of patient, obtaining history from patient or surrogate, ordering and performing treatments and interventions, ordering and review of laboratory studies, ordering and review of radiographic studies, pulse oximetry and re-evaluation of patient's  condition.  {Patient with small bowel obstruction.  I spoke with general surgery Dr. Robyne Peers and she recommended placing an NG tube.  Patient will get IV fluids and stop his blood thinners and will be NPO.  General surgery will see the patient tomorrow Click here for ABCD2, HEART and other calculatorsREFRESH Note before signing :1}                          Medical Decision Making Amount and/or Complexity of Data Reviewed Labs: ordered. Radiology: ordered.  Risk Prescription drug management. Decision regarding hospitalization.   Small bowel obstruction that we admitted to medicine with surgery consult  {Document critical care time when appropriate:1} {Document review of labs and clinical decision tools ie heart score, Chads2Vasc2 etc:1}  {Document your independent review of radiology images, and any outside records:1} {Document your discussion with family members, caretakers, and with consultants:1} {Document social determinants of health affecting pt's care:1} {Document your decision making why or why not admission, treatments were needed:1} Final Clinical Impression(s) / ED Diagnoses Final diagnoses:  SBO (small bowel obstruction) (HCC)    Rx / DC Orders ED Discharge Orders     None

## 2022-10-27 ENCOUNTER — Inpatient Hospital Stay (HOSPITAL_COMMUNITY): Payer: PPO

## 2022-10-27 DIAGNOSIS — I480 Paroxysmal atrial fibrillation: Secondary | ICD-10-CM

## 2022-10-27 DIAGNOSIS — E7849 Other hyperlipidemia: Secondary | ICD-10-CM | POA: Diagnosis not present

## 2022-10-27 DIAGNOSIS — K219 Gastro-esophageal reflux disease without esophagitis: Secondary | ICD-10-CM | POA: Diagnosis not present

## 2022-10-27 DIAGNOSIS — K56609 Unspecified intestinal obstruction, unspecified as to partial versus complete obstruction: Secondary | ICD-10-CM

## 2022-10-27 LAB — CBC WITH DIFFERENTIAL/PLATELET
Abs Immature Granulocytes: 0.04 10*3/uL (ref 0.00–0.07)
Basophils Absolute: 0 10*3/uL (ref 0.0–0.1)
Basophils Relative: 0 %
Eosinophils Absolute: 0 10*3/uL (ref 0.0–0.5)
Eosinophils Relative: 0 %
HCT: 40.9 % (ref 39.0–52.0)
Hemoglobin: 13.7 g/dL (ref 13.0–17.0)
Immature Granulocytes: 0 %
Lymphocytes Relative: 10 %
Lymphs Abs: 0.9 10*3/uL (ref 0.7–4.0)
MCH: 31.4 pg (ref 26.0–34.0)
MCHC: 33.5 g/dL (ref 30.0–36.0)
MCV: 93.6 fL (ref 80.0–100.0)
Monocytes Absolute: 0.9 10*3/uL (ref 0.1–1.0)
Monocytes Relative: 10 %
Neutro Abs: 7.4 10*3/uL (ref 1.7–7.7)
Neutrophils Relative %: 80 %
Platelets: 259 10*3/uL (ref 150–400)
RBC: 4.37 MIL/uL (ref 4.22–5.81)
RDW: 13.8 % (ref 11.5–15.5)
WBC: 9.3 10*3/uL (ref 4.0–10.5)
nRBC: 0 % (ref 0.0–0.2)

## 2022-10-27 LAB — COMPREHENSIVE METABOLIC PANEL
ALT: 19 U/L (ref 0–44)
AST: 19 U/L (ref 15–41)
Albumin: 4 g/dL (ref 3.5–5.0)
Alkaline Phosphatase: 65 U/L (ref 38–126)
Anion gap: 14 (ref 5–15)
BUN: 23 mg/dL (ref 8–23)
CO2: 32 mmol/L (ref 22–32)
Calcium: 9.1 mg/dL (ref 8.9–10.3)
Chloride: 96 mmol/L — ABNORMAL LOW (ref 98–111)
Creatinine, Ser: 1.11 mg/dL (ref 0.61–1.24)
GFR, Estimated: >60 mL/min (ref >60)
Glucose, Bld: 131 mg/dL — ABNORMAL HIGH (ref 70–99)
Potassium: 3.1 mmol/L — ABNORMAL LOW (ref 3.5–5.1)
Sodium: 142 mmol/L (ref 135–145)
Total Bilirubin: 1.2 mg/dL (ref 0.3–1.2)
Total Protein: 7.1 g/dL (ref 6.5–8.1)

## 2022-10-27 LAB — MAGNESIUM: Magnesium: 1.9 mg/dL (ref 1.7–2.4)

## 2022-10-27 MED ORDER — PANTOPRAZOLE SODIUM 40 MG IV SOLR
40.0000 mg | INTRAVENOUS | Status: DC
  Administered 2022-10-27 – 2022-10-29 (×3): 40 mg via INTRAVENOUS
  Filled 2022-10-27 (×3): qty 10

## 2022-10-27 MED ORDER — BISACODYL 10 MG RE SUPP
10.0000 mg | Freq: Every day | RECTAL | Status: DC
  Administered 2022-10-27: 10 mg via RECTAL
  Filled 2022-10-27 (×3): qty 1

## 2022-10-27 MED ORDER — PHENOL 1.4 % MT LIQD
1.0000 | OROMUCOSAL | Status: DC | PRN
  Filled 2022-10-27: qty 177

## 2022-10-27 MED ORDER — SODIUM CHLORIDE 0.9 % IV SOLN: INTRAVENOUS | Status: DC

## 2022-10-27 NOTE — Progress Notes (Signed)
Transition of Care [facility]) - Inpatient Brief Assessment   Patient Details  Name: [name] MRN: [id number] Date of Birth: 23-Aug-1940  Transition of Care [facility]) CM/SW Contact:    Annice Needy, LCSW Phone Number: 10/27/2022, 1:55 PM   Clinical Narrative: Transition of Care Department [facility]) has reviewed patient and no TOC needs have been identified at this time. We will continue to monitor patient advancement through interdisciplinary progression rounds. If new patient transition needs arise, please place a TOC consult.   Transition of Care Asessment: Insurance and Status: Insurance coverage has been reviewed Patient has primary care physician: Yes Home environment has been reviewed: from home with spouse/significant other Prior level of function:: patient is ambulating with supervision with a rolling walker currently Prior/Current Home Services: No current home services Social Determinants of Health Reivew: SDOH reviewed no interventions necessary Readmission risk has been reviewed: Yes Transition of care needs: no transition of care needs at this time

## 2022-10-27 NOTE — Assessment & Plan Note (Signed)
-   Resume Eliquis and metoprolol when patient is able to tolerate p.o.

## 2022-10-27 NOTE — Progress Notes (Signed)
Ambulated in hallway this morning and has been sitting in chair since.  NG tube in place and output has slowed.  Denies abd pain and nausea.  Received suppository this morning

## 2022-10-27 NOTE — Assessment & Plan Note (Signed)
-   Holding statin until the patient is able to tolerate p.o.

## 2022-10-27 NOTE — Assessment & Plan Note (Signed)
-   Resume PPI when patient is able to tolerate p.o.

## 2022-10-27 NOTE — Progress Notes (Signed)
  DAY ZERO NOTE    [name]  ZOX:[id number] DOB: [birth year] DOA: 10/26/2022 PCP: Babs Sciara, MD   Brief Narrative:  Patient admitted earlier this morning, see H&P on today's date for further details.  Briefly - patient was evaluated with concerns for small bowel obstruction given dilated loops of small bowel noted on imaging with worsening abdominal distention and intractable nausea and vomiting.  NG tube placed with immediate improvement in symptoms.  Surgery consulted.  Plan to monitor conservatively in hopes for resolution without surgical procedure at this time.   Assessment & Plan:   Principal Problem:   SBO (small bowel obstruction) (HCC) Active Problems:   ATRIAL FIBRILLATION, PAROXYSMAL   Hyperlipidemia   Gastroesophageal reflux disease  SBO (small bowel obstruction) (HCC) N.p.o., continue IV fluids, supportive care General surgery hoping to avoid procedure, will reevaluate 10/29/2022 for possible intervention if no improvement in symptoms by that time   Gastroesophageal reflux disease - Transition to IV PPI   Hyperlipidemia - Holding statin until the patient is able to tolerate p.o.   Paroxysmal A-fib, rate controlled - Resume Eliquis and metoprolol when patient is able to tolerate p.o.  Subjective: No acute issues or events since admission, abdominal pain and distention improved with NG tube placement.  Otherwise denies any further vomiting diarrhea abdominal pain headache fevers chills or chest pain  Objective: Vitals:   10/26/22 2000 10/26/22 2100 10/26/22 2246 10/27/22 0400  BP: 134/81 (!) 143/105 (!) 153/103 (!) 155/92  Pulse: (!) 117 (!) 110 (!) 109 (!) 106  Resp: 20 20 20 20   Temp:   98.2 F (36.8 C) 98.4 F (36.9 C)  TempSrc:   Oral Oral  SpO2: 95% 95% 91% 90%    Intake/Output Summary (Last 24 hours) at 10/27/2022 8119 Last data filed at 10/27/2022 0200 Gross per 24 hour  Intake --  Output 3500 ml  Net -3500 ml   There were no vitals  filed for this visit.  Examination:  General:  Pleasantly resting in bed, No acute distress. HEENT: NG intact draining dark bilious fluid. Neck:  Without mass or deformity.  Trachea is midline. Lungs:  Clear to auscultate bilaterally without rhonchi, wheeze, or rales. Heart:  Regular rate and rhythm.  Without murmurs, rubs, or gallops. Abdomen:  Soft, nontender, nondistended.  Without guarding or rebound. Extremities: Without cyanosis, clubbing, edema, or obvious deformity. Vascular:  Dorsalis pedis and posterior tibial pulses palpable bilaterally. Skin:  Warm and dry, no erythema    LOS: 1 day    Time spent:   Azucena Fallen, DO [facility]  If 7PM-7AM, please contact night-coverage [email or web address]  10/27/2022, 6:33 AM

## 2022-10-27 NOTE — Assessment & Plan Note (Signed)
-   No history of SBO, malignancy, or abdominal surgery - CT abdomen shows a high-grade SBO - NG tube placed - General surgery consulted by the ER and plans to see patient in the a.m. - N.p.o. - Pain control with pain scale

## 2022-10-27 NOTE — Consult Note (Signed)
[facility] Consult  Reason for Consult: Small bowel obstruction Referring Physician: Dr. Estell Harpin  Chief Complaint   Abdominal Pain     HPI: [name] is a 82 y.o. male who presented to the hospital with a 1 day history of nausea and vomiting.  He states that over the last 6 months he has felt that he has been gaining weight, but he is unsure why, as he has not been consuming a significantly larger amount of food.  Starting yesterday morning, he felt bloated and went to breakfast with some friends.  After breakfast he started feeling significant abdominal distention with associated nausea and vomiting.  He denied significant abdominal pain associated with this.  He also denied any diarrhea.  His last bowel movement was yesterday morning, and it was smaller than normal for him.  He states that he passed a small amount of flatus this morning.  He currently denies any abdominal pain.  Feels his abdominal distention has significantly improved since NG tube placement.  His past medical history is significant for GERD, PE on Eliquis, chronic back pain with multiple back surgeries, and hypertension.  In the ED, he underwent a CT of the abdomen and pelvis which demonstrated marked distention of the stomach with an air-fluid level, and dilation of proximal loops of small bowel with decompression of distal small bowel loops concerning for high-grade partial small bowel obstruction with transition zone likely in the right midabdomen.  He had no leukocytosis on blood work and vitals were otherwise stable.  NG tube was placed and he has had 4.2 L of gastric contents removed.  Past Medical History:  Diagnosis Date   Arthritis    Cancer (HCC)    skin   Carpal tunnel syndrome    Cataract    left eye   Chronic back pain    multiple back surgeries   Complication of anesthesia    pt states woke up during hemorroid surgery   DVT (deep venous thrombosis) (HCC) 03/2007   left leg     Dysrhythmia    GERD (gastroesophageal reflux disease)    takes Protonix daily   Glaucoma    History of colon polyps    History of hyperglycemia    History of MRSA infection 1992   HTN (hypertension)    takes  Ramipril daily   Hyperlipidemia    takes Pravastatin 3 times a week   Nocturia    Paroxysmal atrial fibrillation (HCC)    takes Pradaxa daily, Chads2vasc score 2(07/11/14)   Pneumonia    many yrs ago   Pulmonary embolism (HCC) 2008    Past Surgical History:  Procedure Laterality Date   APPLICATION OF ROBOTIC ASSISTANCE FOR SPINAL PROCEDURE  05/06/2021   Procedure: APPLICATION OF ROBOTIC ASSISTANCE FOR SPINAL PROCEDURE;  Surgeon: Barnett Abu, MD;  Location: [facility] OR;  Service: Neurosurgery;;   BACK SURGERY     BIOPSY  04/01/2018   Procedure: BIOPSY;  Surgeon: Malissa Hippo, MD;  Location: [facility];  Service: Endoscopy;;  gastric   CARDIAC CATHETERIZATION  05/18/2006   CARPAL TUNNEL RELEASE Right    cataract surgery Right    cervical and lumbar surgery     COLONOSCOPY  04/24/2011   Procedure: COLONOSCOPY;  Surgeon: Malissa Hippo, MD;  Location: [facility];  Service: Endoscopy;  Laterality: N/A;  1200   COLONOSCOPY N/A 08/13/2016   Procedure: COLONOSCOPY;  Surgeon: Malissa Hippo, MD;  [facility]: [facility];  Service: Endoscopy;  Laterality: N/A;  1200   ESOPHAGOGASTRODUODENOSCOPY  03/10/2012   Procedure: ESOPHAGOGASTRODUODENOSCOPY (EGD);  Surgeon: Malissa Hippo, MD;  [facility];  Service: Endoscopy;  Laterality: N/A;  325   ESOPHAGOGASTRODUODENOSCOPY (EGD) WITH PROPOFOL N/A 04/01/2018   Procedure: ESOPHAGOGASTRODUODENOSCOPY (EGD) WITH PROPOFOL;  Surgeon: Malissa Hippo, MD;  [facility];  Service: Endoscopy;  Laterality: N/A;   EYE SURGERY     HARDWARE REMOVAL N/A 04/24/2014   Procedure: REMOVAL OF SACRAL INSTRUMENTATION WITH METREX;  Surgeon: Barnett Abu, MD;  Location: [facility];  Service: Neurosurgery;  Laterality: N/A;    HEMORRHOIDECTOMY WITH HEMORRHOID BANDING     JOINT REPLACEMENT     KNEE ARTHROSCOPY Right    KNEE ARTHROSCOPY WITH MEDIAL MENISECTOMY Right 08/03/2018   Procedure: RIGHT KNEE ARTHROSCOPY WITH PARTIAL MEDIAL MENISCECTOMY;  Surgeon: Tarry Kos, MD;  Location: [facility];  Service: Orthopedics;  Laterality: Right;   KNEE SURGERY     left   LASIK     POLYPECTOMY  08/13/2016   Procedure: POLYPECTOMY;  Surgeon: Malissa Hippo, MD;  [facility];  Service: Endoscopy;;  colon   PVI  01/24/2010   afib ablation   TRANSFORAMINAL LUMBAR INTERBODY FUSION (TLIF) WITH PEDICLE SCREW FIXATION 1 LEVEL N/A 05/06/2021   Procedure: Transforaminal Lumbar Interbody Fusion  - Thoracic twelve-Lumbar one fixation Thoracic nine to Lumbar one with methacrylate augmentation with mazor;  Surgeon: Barnett Abu, MD;  Location: [facility];  Service: Neurosurgery;  Laterality: N/A;    Family History  Problem Relation Age of Onset   Hypertension Mother    Transient ischemic attack Mother    Heart disease Father    Diabetes Brother    Healthy Daughter    Colon cancer Sister    Hypertension Other    Heart disease Other     Social History   Tobacco Use   Smoking status: Former    Packs/day: 0.25    Years: 3.00    Additional pack years: 0.00    Total pack years: 0.75    Types: Cigarettes    Quit date: 03/30/1969    Years since quitting: 53.6    Passive exposure: Past   Smokeless tobacco: Never  Vaping Use   Vaping Use: Never used  Substance Use Topics   Alcohol use: Yes    Alcohol/week: 14.0 standard drinks of alcohol    Types: 7 Glasses of wine, 7 Shots of liquor per week    Comment: Jack nightly   Drug use: No    Medications: I have reviewed the patient's current medications. Prior to Admission:  Medications Prior to Admission  Medication Sig Dispense Refill Last Dose   amLODipine (NORVASC) 10 MG tablet TAKE 1 TABLET BY MOUTH EVERY DAY 90 tablet 1    ELIQUIS 5 MG TABS  tablet TAKE 1 TABLET BY MOUTH TWICE A DAY. STOP PRADAXA (Patient not taking: Reported on 10/13/2022) 60 tablet 5    indapamide (LOZOL) 1.25 MG tablet Take 1 tablet (1.25 mg total) by mouth daily. 30 tablet 5    latanoprost (XALATAN) 0.005 % ophthalmic solution SMARTSIG:1 Drop(s) In Eye(s) Every Evening      metoprolol succinate (TOPROL-XL) 25 MG 24 hr tablet Take 1 tablet (25 mg total) by mouth 2 (two) times daily. 180 tablet 2    Oxycodone HCl 10 MG TABS 1 taken 4 times daily as needed for pain caution drowsiness 120 tablet 0    Oxycodone HCl 10 MG TABS  1 taken 4 times daily as needed for pain caution drowsiness 120 tablet 0    Oxycodone HCl 10 MG TABS Take 1 tablet (10 mg total) by mouth 4 (four) times daily as needed. 120 tablet 0    pantoprazole (PROTONIX) 40 MG tablet TAKE 1 TABLET BY MOUTH EVERY DAY BEFORE BREAKFAST 90 tablet 1    pravastatin (PRAVACHOL) 80 MG tablet TAKE 1 TABLET BY MOUTH EVERYDAY AT BEDTIME 90 tablet 1    psyllium (METAMUCIL SMOOTH TEXTURE) 58.6 % powder Take 1 packet by mouth at bedtime.      ramipril (ALTACE) 10 MG capsule Take 1 capsule (10 mg total) by mouth 2 (two) times daily. 180 capsule 1    tamsulosin (FLOMAX) 0.4 MG CAPS capsule Take 1 capsule (0.4 mg total) by mouth daily. 90 capsule 1    torsemide (DEMADEX) 10 MG tablet Take 1 tablet (10 mg total) by mouth daily. (Patient not taking: Reported on 10/13/2022) 30 tablet 5    XDEMVY 0.25 % SOLN Apply to eye. (Patient not taking: Reported on 10/13/2022)      Scheduled:  bisacodyl  10 mg Rectal Daily   Continuous: WUJ:[id number] injection, ondansetron **OR** ondansetron (ZOFRAN) IV, phenol  Allergies  Allergen Reactions   Sectral [Acebutolol Hcl] Other (See Comments)    Unknown reaction   Sulfonamide Derivatives Other (See Comments)    Runs blood pressure up   Zocor [Simvastatin] Other (See Comments)    Joint pain     ROS:  Pertinent items are noted in HPI.  Blood pressure (!) 159/74, pulse (!) 104,  temperature 98 F (36.7 C), temperature source Oral, resp. rate 18, SpO2 94 %. Physical Exam Vitals reviewed.  Constitutional:      Appearance: He is well-developed.  HENT:     Head: Normocephalic and atraumatic.  Eyes:     Extraocular Movements: Extraocular movements intact.     Pupils: Pupils are equal, round, and reactive to light.  Cardiovascular:     Rate and Rhythm: Normal rate.  Pulmonary:     Effort: Pulmonary effort is normal.  Abdominal:     Comments: Abdomen soft, minimal distention, no percussion tenderness, nontender to palpation; no rigidity, guarding, rebound tenderness  Skin:    General: Skin is warm and dry.  Neurological:     General: No focal deficit present.     Mental Status: He is alert and oriented to person, place, and time.  Psychiatric:        Mood and Affect: Mood normal.        Behavior: Behavior normal.     Results: Results for orders placed or performed during the hospital encounter of 10/26/22 (from the past 48 hour(s))  Lipase, blood     Status: None   Collection Time: 10/26/22  5:54 PM  Result Value Ref Range   Lipase 38 11 - 51 U/L    Comment: Performed at [facility], [address] [place], [place] [address]  Comprehensive metabolic panel     Status: Abnormal   Collection Time: 10/26/22  5:54 PM  Result Value Ref Range   Sodium 136 135 - 145 mmol/L   Potassium 3.8 3.5 - 5.1 mmol/L   Chloride 96 (L) 98 - 111 mmol/L   CO2 26 22 - 32 mmol/L   Glucose, Bld 196 (H) 70 - 99 mg/dL    Comment: Glucose reference range applies only to samples taken after fasting for at least 8 hours.   BUN 21 8 - 23  mg/dL   Creatinine, Ser 8.29 0.61 - 1.24 mg/dL   Calcium 9.6 8.9 - 56.2 mg/dL   Total Protein 8.2 (H) 6.5 - 8.1 g/dL   Albumin 4.8 3.5 - 5.0 g/dL   AST 28 15 - 41 U/L   ALT 24 0 - 44 U/L   Alkaline Phosphatase 80 38 - 126 U/L   Total Bilirubin 1.1 0.3 - 1.2 mg/dL   GFR, Estimated >13 >08 mL/min    Comment: (NOTE) Calculated using the  CKD-EPI Creatinine Equation (2021)    Anion gap 14 5 - 15    Comment: Performed at [facility], [address] [place], [place] [address]  CBC     Status: None   Collection Time: 10/26/22  5:54 PM  Result Value Ref Range   WBC 10.1 4.0 - 10.5 K/uL   RBC 4.91 4.22 - 5.81 MIL/uL   Hemoglobin 15.4 13.0 - 17.0 g/dL   HCT 69.6 29.5 - 28.4 %   MCV 93.1 80.0 - 100.0 fL   MCH 31.4 26.0 - 34.0 pg   MCHC 33.7 30.0 - 36.0 g/dL   RDW 13.2 44.0 - 10.2 %   Platelets 270 150 - 400 K/uL   nRBC 0.0 0.0 - 0.2 %    Comment: Performed at [facility], [address], [place], [place] [address]  Comprehensive metabolic panel     Status: Abnormal   Collection Time: 10/27/22  4:02 AM  Result Value Ref Range   Sodium 142 135 - 145 mmol/L   Potassium 3.1 (L) 3.5 - 5.1 mmol/L   Chloride 96 (L) 98 - 111 mmol/L   CO2 32 22 - 32 mmol/L   Glucose, Bld 131 (H) 70 - 99 mg/dL    Comment: Glucose reference range applies only to samples taken after fasting for at least 8 hours.   BUN 23 8 - 23 mg/dL   Creatinine, Ser 6.44 0.61 - 1.24 mg/dL   Calcium 9.1 8.9 - 03.4 mg/dL   Total Protein 7.1 6.5 - 8.1 g/dL   Albumin 4.0 3.5 - 5.0 g/dL   AST 19 15 - 41 U/L   ALT 19 0 - 44 U/L   Alkaline Phosphatase 65 38 - 126 U/L   Total Bilirubin 1.2 0.3 - 1.2 mg/dL   GFR, Estimated >74 >25 mL/min    Comment: (NOTE) Calculated using the CKD-EPI Creatinine Equation (2021)    Anion gap 14 5 - 15    Comment: Performed at [facility], [address], [place], [place] [address]  Magnesium     Status: None   Collection Time: 10/27/22  4:02 AM  Result Value Ref Range   Magnesium 1.9 1.7 - 2.4 mg/dL    Comment: Performed at [facility], [address] [place], [place] [address]  CBC with Differential/Platelet     Status: None   Collection Time: 10/27/22  4:02 AM  Result Value Ref Range   WBC 9.3 4.0 - 10.5 K/uL   RBC 4.37 4.22 - 5.81 MIL/uL   Hemoglobin 13.7 13.0 - 17.0 g/dL   HCT 32.9 51.8 - 84.1 %   MCV 93.6 80.0 -  100.0 fL   MCH 31.4 26.0 - 34.0 pg   MCHC 33.5 30.0 - 36.0 g/dL   RDW 66.0 63.0 - 16.0 %   Platelets 259 150 - 400 K/uL   nRBC 0.0 0.0 - 0.2 %   Neutrophils Relative % 80 %   Neutro Abs 7.4 1.7 - 7.7 K/uL  Lymphocytes Relative 10 %   Lymphs Abs 0.9 0.7 - 4.0 K/uL   Monocytes Relative 10 %   Monocytes Absolute 0.9 0.1 - 1.0 K/uL   Eosinophils Relative 0 %   Eosinophils Absolute 0.0 0.0 - 0.5 K/uL   Basophils Relative 0 %   Basophils Absolute 0.0 0.0 - 0.1 K/uL   Immature Granulocytes 0 %   Abs Immature Granulocytes 0.04 0.00 - 0.07 K/uL    Comment: Performed at [facility], [address] [place], [place] [address]    DG Abd 1 View  Result Date: 10/27/2022 CLINICAL DATA:  82 year old male2 history of small-bowel obstruction. EXAM: ABDOMEN - 1 VIEW COMPARISON:  Abdominal radiograph 10/26/2022. FINDINGS: Nasogastric tube extends into the proximal stomach. Side port is not well visualized secondary to overlying spinal hardware, but is likely at the level of the gastroesophageal junction. Some dilated loops of small bowel are noted in the left side of the abdomen measuring up to 4 cm in diameter. Some gas and stool are noted throughout the colon. Iodinated contrast material within the lumen of the urinary bladder. No definite pneumoperitoneum. IMPRESSION: 1. Support apparatus, as above. Nasogastric tube could be advanced approximately 10 cm for more optimal placement. 2. Persistent evidence of probable partial small bowel obstruction. Electronically Signed   By: Trudie Reed M.D.   On: 10/27/2022 06:12   DG Abd Portable 1 View  Result Date: 10/26/2022 CLINICAL DATA:  NG tube placement EXAM: PORTABLE ABDOMEN - 1 VIEW COMPARISON:  05/11/2021 FINDINGS: Limited field of view for tube placement verification. An enteric tube is present, extending along the midline to the upper mid abdomen. This likely indicates location in the upper stomach. No acute consolidation suggested in the lung bases.  IMPRESSION: Enteric tube tip projects over the upper mid abdomen consistent with location in the upper stomach. Electronically Signed   By: Burman Nieves M.D.   On: 10/26/2022 20:41   CT ABDOMEN PELVIS W CONTRAST  Result Date: 10/26/2022 CLINICAL DATA:  Abdominal pain, nausea, vomiting EXAM: CT ABDOMEN AND PELVIS WITH CONTRAST TECHNIQUE: Multidetector CT imaging of the abdomen and pelvis was performed using the standard protocol following bolus administration of intravenous contrast. RADIATION DOSE REDUCTION: This exam was performed according to the departmental dose-optimization program which includes automated exposure control, adjustment of the mA and/or kV according to patient size and/or use of iterative reconstruction technique. CONTRAST:  OMNIPAQUE IOHEXOL 300 MG/ML  SOLN COMPARISON:  11/08/2020 FINDINGS: Lower chest: Small linear densities are seen in lower lung fields suggesting scarring or subsegmental atelectasis. Extensive coronary artery calcifications are seen. Hepatobiliary: No focal abnormalities are seen in liver. Gallbladder is not distended. Pancreas: No focal abnormalities are seen. Spleen: Unremarkable. Adrenals/Urinary Tract: Adrenals are unremarkable. There is no hydronephrosis. There are no renal or ureteral stones. There are few small low-density foci in the renal cortex on both sides measuring up to 9 mm in diameter suggesting renal cysts. Urinary bladder is not distended. Stomach/Bowel: There is fluid in the lumen of the thoracic esophagus. There is marked distention of stomach with air-fluid level. There is dilation of duodenum and proximal jejunal loops. Distal small-bowel loops are decompressed. Zone of transition appears to be in right mid abdomen. There is mixed attenuation material in the lumen of dilated small bowel loop close to the transition zone. Appendix is not dilated. There is no significant wall thickening in colon. Multiple diverticula are seen in colon without  signs of focal acute diverticulitis. There is decompression of  transverse and descending colon. Vascular/Lymphatic: Arterial calcifications are seen in aorta and its major branches. Reproductive: Prostate is enlarged. Other: There is no ascites or pneumoperitoneum. Small umbilical hernia containing fat is seen. Left inguinal hernia containing fat is seen. Musculoskeletal: There is surgical fusion from T9-L5 levels. IMPRESSION: There is marked distention of stomach with air-fluid level. There is fluid in the lumen of thoracic esophagus. There is abnormal dilation of proximal small bowel loops with decompression of distal small bowel loops. Findings suggest high-grade partial small bowel obstruction with zone of transition probably in right mid abdomen. This may be caused by adhesions or internal hernia. There is no hydronephrosis. Diverticulosis of colon without signs of focal diverticulitis. Aortic arteriosclerosis. Coronary artery calcifications are seen. Lumbar spondylosis. Postsurgical changes are noted in lower thoracic spine and lumbar spine. Possible small bilateral renal cysts.  Enlarged prostate. Other findings as described in the body of the report. Electronically Signed   By: Ernie Avena M.D.   On: 10/26/2022 19:40     Assessment & Plan:  ANARI TINSON is a 82 y.o. male who presented to the hospital with a 1 day history of nausea and vomiting.  CT of the abdomen and pelvis demonstrated concerns for high-grade partial small bowel obstruction with significant distention of the stomach and dilation of proximal loops of small bowel.  Imaging and blood work evaluated by myself.  -While patient does not have a history of abdominal surgeries that could explain adhesive disease, will still proceed with conservative measures, especially since patient takes Eliquis with his last dose being yesterday morning -NG to LIS -NPO -IV fluids -Dulcolax suppository ordered -Monitor for return of bowel  function -Will plan for small bowel obstruction protocol in the next day or 2 pending how much NG tube output he has in the next 24 hours -Hold Eliquis -Appreciate hospitalist recommendations  All questions were answered to the satisfaction of the patient.  -- Theophilus Kinds, DO [facility] [address] [place], [place] [address] [phone] (office)

## 2022-10-27 NOTE — H&P (Signed)
History and Physical    Patient: [name]:[id number] DOB: [birth year] DOA: 10/26/2022 DOS: the patient was seen and examined on 10/27/2022 PCP: [name] Sciara, MD  Patient coming from: Home  Chief Complaint:  Chief Complaint  Patient presents with   Abdominal Pain   HPI: [name] is a 82 y.o. male with medical history significant of arthritis, DVT, GERD, hypertension, hyperlipidemia, paroxysmal atrial fibrillation, PE, and more ED with a chief complaint of of nausea.  Patient reports that he ate breakfast this morning, but he did not feel good.  The daughter had had nausea and vomiting the day prior in which they suspected was from food poisoning.  He thought that he had caught her food poisoning.  His pain continued to get worse until he came in.  Daughter at bedside reports he has actually had abdominal distention over the last 3 to 4 months but it got worse over the last week.  He is also been gaining weight.  Patient reports he had nausea and abdominal tightness.  Laying on the left made it better, but he continued to throw up while laying on the left and only improve the pain.  He reports he might have thrown up 30 or 40 times prior to arrival.  The emesis was green with no blood.  His last bowel movement was this morning.  He reports he actually had 2 bowel movements this morning, but they were not normal.  He cannot describe it he just says they were not correct.  His last normal bowel movement was 2 to 3 days ago.  Patient takes Metamucil every day.Marland Kitchen  He has had dyspnea for couple years with orthopnea there is been no change in that.  Patient reports his last colonoscopy was due 6 months ago, he was not able to go because his heart was rhythm.  He has not rescheduled to go back.  Patient is full code. Review of Systems: As mentioned in the history of present illness. All other systems reviewed and are negative. Past Medical History:  Diagnosis Date   Arthritis    Cancer  (HCC)    skin   Carpal tunnel syndrome    Cataract    left eye   Chronic back pain    multiple back surgeries   Complication of anesthesia    pt states woke up during hemorroid surgery   DVT (deep venous thrombosis) (HCC) 03/2007   left leg    Dysrhythmia    GERD (gastroesophageal reflux disease)    takes Protonix daily   Glaucoma    History of colon polyps    History of hyperglycemia    History of MRSA infection 1992   HTN (hypertension)    takes  Ramipril daily   Hyperlipidemia    takes Pravastatin 3 times a week   Nocturia    Paroxysmal atrial fibrillation (HCC)    takes Pradaxa daily, Chads2vasc score 2(07/11/14)   Pneumonia    many yrs ago   Pulmonary embolism (HCC) 2008   Past Surgical History:  Procedure Laterality Date   APPLICATION OF ROBOTIC ASSISTANCE FOR SPINAL PROCEDURE  05/06/2021   Procedure: APPLICATION OF ROBOTIC ASSISTANCE FOR SPINAL PROCEDURE;  Surgeon: [name] Abu, MD;  Location: [facility];  Service: Neurosurgery;;   BACK SURGERY     BIOPSY  04/01/2018   Procedure: BIOPSY;  Surgeon: [name] Hippo, MD;  Location: [facility];  Service: Endoscopy;;  gastric   CARDIAC CATHETERIZATION  05/18/2006  CARPAL TUNNEL RELEASE Right    cataract surgery Right    cervical and lumbar surgery     COLONOSCOPY  04/24/2011   Procedure: COLONOSCOPY;  Surgeon: [name] Hippo, MD;  Location: [facility];  Service: Endoscopy;  Laterality: N/A;  1200   COLONOSCOPY N/A 08/13/2016   Procedure: COLONOSCOPY;  Surgeon: [name] Hippo, MD;  Location: [facility];  Service: Endoscopy;  Laterality: N/A;  1200   ESOPHAGOGASTRODUODENOSCOPY  03/10/2012   Procedure: ESOPHAGOGASTRODUODENOSCOPY (EGD);  Surgeon: [name] Hippo, MD;  Location: [facility];  Service: Endoscopy;  Laterality: N/A;  325   ESOPHAGOGASTRODUODENOSCOPY (EGD) WITH PROPOFOL N/A 04/01/2018   Procedure: ESOPHAGOGASTRODUODENOSCOPY (EGD) WITH PROPOFOL;  Surgeon: [name] Hippo, MD;  [facility];  Service: Endoscopy;  Laterality: N/A;   EYE SURGERY     HARDWARE REMOVAL N/A 04/24/2014   Procedure: REMOVAL OF SACRAL INSTRUMENTATION WITH METREX;  Surgeon: [name] Abu, MD;  Location: [facility];  Service: Neurosurgery;  Laterality: N/A;   HEMORRHOIDECTOMY WITH HEMORRHOID BANDING     JOINT REPLACEMENT     KNEE ARTHROSCOPY Right    KNEE ARTHROSCOPY WITH MEDIAL MENISECTOMY Right 08/03/2018   Procedure: RIGHT KNEE ARTHROSCOPY WITH PARTIAL MEDIAL MENISCECTOMY;  Surgeon: [name] Kos, MD;  Location: [facility];  Service: Orthopedics;  Laterality: Right;   KNEE SURGERY     left   LASIK     POLYPECTOMY  08/13/2016   Procedure: POLYPECTOMY;  Surgeon: [name] Hippo, MD;  Location: [facility];  Service: Endoscopy;;  colon   PVI  01/24/2010   afib ablation   TRANSFORAMINAL LUMBAR INTERBODY FUSION (TLIF) WITH PEDICLE SCREW FIXATION 1 LEVEL N/A 05/06/2021   Procedure: Transforaminal Lumbar Interbody Fusion  - Thoracic twelve-Lumbar one fixation Thoracic nine to Lumbar one with methacrylate augmentation with mazor;  Surgeon: [name] Abu, MD;  Location: [facility];  Service: Neurosurgery;  Laterality: N/A;   Social History:  reports that he quit smoking about 53 years ago. His smoking use included cigarettes. He has a 0.75 pack-year smoking history. He has been exposed to tobacco smoke. He has never used smokeless tobacco. He reports current alcohol use of about 14.0 standard drinks of alcohol per week. He reports that he does not use drugs.  Allergies  Allergen Reactions   Sectral [Acebutolol Hcl] Other (See Comments)    Unknown reaction   Sulfonamide Derivatives Other (See Comments)    Runs blood pressure up   Zocor [Simvastatin] Other (See Comments)    Joint pain    Family History  Problem Relation Age of Onset   Hypertension Mother    Transient ischemic attack Mother    Heart disease Father    Diabetes Brother    Healthy Daughter    Colon cancer Sister     Hypertension Other    Heart disease Other     Prior to Admission medications   Medication Sig Start Date End Date Taking? Authorizing Provider  amLODipine (NORVASC) 10 MG tablet TAKE 1 TABLET BY MOUTH EVERY DAY 10/08/22   Luking, [name] Coup, MD  ELIQUIS 5 MG TABS tablet TAKE 1 TABLET BY MOUTH TWICE A DAY. STOP PRADAXA Patient not taking: Reported on 10/13/2022 09/11/22   [name] Sciara, MD  indapamide (LOZOL) 1.25 MG tablet Take 1 tablet (1.25 mg total) by mouth daily. 10/13/22   [name] Sciara, MD  ketorolac (ACULAR) 0.5 % ophthalmic solution Place 1 drop into the left eye 2 (two) times  daily. 11/20/21   [provider]  latanoprost (XALATAN) 0.005 % ophthalmic solution SMARTSIG:1 Drop(s) In Eye(s) Every Evening 07/11/22   [provider]  metoprolol succinate (TOPROL-XL) 25 MG 24 hr tablet Take 1 tablet (25 mg total) by mouth 2 (two) times daily. 10/22/22 10/22/23  Mallipeddi, Vishnu P, MD  Oxycodone HCl 10 MG TABS 1 taken 4 times daily as needed for pain caution drowsiness 10/13/22   [name] Sciara, MD  Oxycodone HCl 10 MG TABS 1 taken 4 times daily as needed for pain caution drowsiness 10/13/22   Luking, [name] Coup, MD  Oxycodone HCl 10 MG TABS Take 1 tablet (10 mg total) by mouth 4 (four) times daily as needed. 10/13/22   [name] Sciara, MD  pantoprazole (PROTONIX) 40 MG tablet TAKE 1 TABLET BY MOUTH EVERY DAY BEFORE BREAKFAST 10/08/22   [name] Sciara, MD  pravastatin (PRAVACHOL) 80 MG tablet TAKE 1 TABLET BY MOUTH EVERYDAY AT BEDTIME 10/08/22   Luking, Scott A, MD  psyllium (METAMUCIL SMOOTH TEXTURE) 58.6 % powder Take 1 packet by mouth at bedtime. 01/14/21   [name] Hippo, MD  ramipril (ALTACE) 10 MG capsule Take 1 capsule (10 mg total) by mouth 2 (two) times daily. 10/13/22   [name] Sciara, MD  tamsulosin (FLOMAX) 0.4 MG CAPS capsule Take 1 capsule (0.4 mg total) by mouth daily. 10/13/22   [name] Sciara, MD  torsemide (DEMADEX) 10 MG tablet Take 1 tablet (10 mg total) by mouth  daily. Patient not taking: Reported on 10/13/2022 02/26/22   [name] Sciara, MD  XDEMVY 0.25 % SOLN Apply to eye. Patient not taking: Reported on 10/13/2022 06/10/22   [provider]    Physical Exam: Vitals:   10/26/22 1915 10/26/22 2000 10/26/22 2100 10/26/22 2246  BP: (!) 139/95 134/81 (!) 143/105 (!) 153/103  Pulse: (!) 110 (!) 117 (!) 110 (!) 109  Resp: 19 20 20 20   Temp: 98.6 F (37 C)   98.2 F (36.8 C)  TempSrc: Oral   Oral  SpO2: 94% 95% 95% 91%   1.  General: Patient lying supine in bed,  no acute distress   2. Psychiatric: Alert and oriented x 3, mood and behavior normal for situation, pleasant and cooperative with exam   3. Neurologic: Speech and language are normal, face is symmetric, moves all 4 extremities voluntarily, at baseline without acute deficits on limited exam   4. HEENMT:  Head is atraumatic, normocephalic, pupils reactive to light, neck is supple, trachea is midline, mucous membranes are moist   5. Respiratory : Lungs are clear to auscultation bilaterally without wheezing, rhonchi, rales, no cyanosis, no increase in work of breathing or accessory muscle use   6. Cardiovascular : Heart rate normal, rhythm is regular, no murmurs, rubs or gallops, no peripheral edema, peripheral pulses palpated   7. Gastrointestinal:  Abdomen is soft, distended, tenderness to palpation in the right lower quadrant, bowel sounds active, no masses or organomegaly palpated   8. Skin:  Skin is warm, dry and intact without rashes, acute lesions, or ulcers on limited exam   9.Musculoskeletal:  No acute deformities or trauma, no asymmetry in tone, no peripheral edema, peripheral pulses palpated, no tenderness to palpation in the extremities  Data Reviewed: In the ED Temp 98.2, heart rate 74-117, respiratory rate 18-20, blood pressure 134/81-172/107, satting 90-97% on room air No leukocytosis with white blood cell count of 10.1 Chemistry is unremarkable CT  abdomen pelvis shows high-grade small bowel  obstruction Chest x-ray shows no acute cardiopulmonary disease Patient was given Dilaudid, morphine, 2 doses of Zofran, and NS 500 and General surgery was consulted who recommended NG tube NG tube was placed in the ED Admission was requested for SBO  Assessment and Plan: * SBO (small bowel obstruction) (HCC) - No history of SBO, malignancy, or abdominal surgery - CT abdomen shows a high-grade SBO - NG tube placed - General surgery consulted by the ER and plans to see patient in the a.m. - N.p.o. - Pain control with pain scale  Gastroesophageal reflux disease - Resume PPI when patient is able to tolerate p.o.  Hyperlipidemia - Holding statin until the patient is able to tolerate p.o.  ATRIAL FIBRILLATION, PAROXYSMAL - Resume Eliquis and metoprolol when patient is able to tolerate p.o.      Advance Care Planning:   Code Status: Full Code  Consults: General surgery  Family Communication: No family at bedside  Severity of Illness: The appropriate patient status for this patient is INPATIENT. Inpatient status is judged to be reasonable and necessary in order to provide the required intensity of service to ensure the patient's safety. The patient's presenting symptoms, physical exam findings, and initial radiographic and laboratory data in the context of their chronic comorbidities is felt to place them at high risk for further clinical deterioration. Furthermore, it is not anticipated that the patient will be medically stable for discharge from the hospital within 2 midnights of admission.   * I certify that at the point of admission it is my clinical judgment that the patient will require inpatient hospital care spanning beyond 2 midnights from the point of admission due to high intensity of service, high risk for further deterioration and high frequency of surveillance required.*  Author: Lilyan Gilford, DO 10/27/2022 4:19  AM  For on call review [email or web address].

## 2022-10-27 NOTE — Progress Notes (Signed)
Mobility Specialist Progress Note:    10/27/22 1021  Mobility  Activity Ambulated with assistance in hallway;Ambulated with assistance to bathroom  Level of Assistance Contact guard assist, steadying assist  Assistive Device Front wheel walker  Distance Ambulated (ft) 100 ft  Range of Motion/Exercises Active;All extremities  Activity Response Tolerated well  Mobility Referral Yes  $Mobility charge 1 Mobility  Mobility Specialist Start Time (ACUTE ONLY) 1005  Mobility Specialist Stop Time (ACUTE ONLY) 1020  Mobility Specialist Time Calculation (min) (ACUTE ONLY) 15 min   Pt eager for mobility session. RN disconnected NG tube from wall for ambulation. Tolerated session well, asx throughout. Used RW and CGA for safety. Returned pt to room, sitting up in chair. Nursing staff in room connecting NG tube to suction, all needs met, family in room.   Feliciana Rossetti Mobility Specialist Please contact via Special educational needs teacher or  Rehab office at [phone]

## 2022-10-27 NOTE — Progress Notes (Signed)
Laying in bed on left side, has complaints of a sore throat from NG tube, Chloraseptic spray given to help with discomfort. NG to suction with a total empted output of since arrival to until around 2300 on 10/26/22.

## 2022-10-28 ENCOUNTER — Inpatient Hospital Stay (HOSPITAL_COMMUNITY): Payer: PPO

## 2022-10-28 ENCOUNTER — Encounter (HOSPITAL_COMMUNITY): Payer: Self-pay | Admitting: Family Medicine

## 2022-10-28 DIAGNOSIS — K56609 Unspecified intestinal obstruction, unspecified as to partial versus complete obstruction: Secondary | ICD-10-CM | POA: Diagnosis not present

## 2022-10-28 LAB — BASIC METABOLIC PANEL
Anion gap: 12 (ref 5–15)
BUN: 27 mg/dL — ABNORMAL HIGH (ref 8–23)
CO2: 30 mmol/L (ref 22–32)
Calcium: 8.4 mg/dL — ABNORMAL LOW (ref 8.9–10.3)
Chloride: 101 mmol/L (ref 98–111)
Creatinine, Ser: 1.02 mg/dL (ref 0.61–1.24)
GFR, Estimated: >60 mL/min (ref >60)
Glucose, Bld: 114 mg/dL — ABNORMAL HIGH (ref 70–99)
Potassium: 2.9 mmol/L — ABNORMAL LOW (ref 3.5–5.1)
Sodium: 143 mmol/L (ref 135–145)

## 2022-10-28 LAB — MAGNESIUM: Magnesium: 1.9 mg/dL (ref 1.7–2.4)

## 2022-10-28 MED ORDER — HYDRALAZINE HCL 20 MG/ML IJ SOLN: 10.0000 mg | INTRAMUSCULAR | Status: DC | PRN

## 2022-10-28 MED ORDER — DIATRIZOATE MEGLUMINE & SODIUM 66-10 % PO SOLN
90.0000 mL | Freq: Once | ORAL | Status: AC
  Administered 2022-10-28: 90 mL via NASOGASTRIC
  Filled 2022-10-28: qty 90

## 2022-10-28 MED ORDER — POTASSIUM CHLORIDE 10 MEQ/100ML IV SOLN
10.0000 meq | INTRAVENOUS | Status: AC
  Administered 2022-10-28 (×3): 10 meq via INTRAVENOUS
  Filled 2022-10-28 (×4): qty 100

## 2022-10-28 MED ORDER — POTASSIUM CHLORIDE 10 MEQ/100ML IV SOLN
10.0000 meq | INTRAVENOUS | Status: AC
  Administered 2022-10-28 (×3): 10 meq via INTRAVENOUS
  Filled 2022-10-28 (×2): qty 100

## 2022-10-28 MED ORDER — METOPROLOL TARTRATE 5 MG/5ML IV SOLN
5.0000 mg | Freq: Three times a day (TID) | INTRAVENOUS | Status: DC
  Administered 2022-10-28 – 2022-10-29 (×5): 5 mg via INTRAVENOUS
  Filled 2022-10-28 (×5): qty 5

## 2022-10-28 MED ORDER — LATANOPROST 0.005 % OP SOLN
1.0000 [drp] | Freq: Every day | OPHTHALMIC | Status: DC
  Administered 2022-10-28: 1 [drp] via OPHTHALMIC
  Filled 2022-10-28: qty 2.5

## 2022-10-28 NOTE — Plan of Care (Signed)

## 2022-10-28 NOTE — Progress Notes (Signed)
[facility] Progress Note     Subjective: Patient seen and examined.  He is sitting in chair at the side of the bed.  He feels that his abdomen is less distended.  He denies any nausea or vomiting.  He confirms passing small amount of flatus overnight and had a couple of bowel movements this morning.  He feels hungry and is asking for food.  Objective: Vital signs in last 24 hours: Temp:  [98.3 F (36.8 C)-98.6 F (37 C)] 98.3 F (36.8 C) (06/12 0835) Pulse Rate:  [88-93] 88 (06/12 0835) Resp:  [16-20] 16 (06/12 0835) BP: (147-166)/(88-112) 165/90 (06/12 0835) SpO2:  [88 %-94 %] 94 % (06/12 0835) Last BM Date : 10/28/22  Intake/Output from previous day: 06/11 0701 - 06/12 0700 In: 1071 [I.V.:821; NG/GT:250] Out: 2300 [Emesis/NG output:2300] Intake/Output this shift: No intake/output data recorded.  General appearance: alert, cooperative, and no distress Nose: NG tube in place, currently clamped GI: Abdomen soft, mild distention, no percussion tenderness, nontender to palpation; no rigidity, guarding, rebound tenderness  Lab Results:  Recent Labs    10/26/22 1754 10/27/22 0402  WBC 10.1 9.3  HGB 15.4 13.7  HCT 45.7 40.9  PLT 270 259   BMET Recent Labs    10/27/22 0402 10/28/22 0414  NA 142 143  K 3.1* 2.9*  CL 96* 101  CO2 32 30  GLUCOSE 131* 114*  BUN 23 27*  CREATININE 1.11 1.02  CALCIUM 9.1 8.4*   PT/INR No results for input(s): "LABPROT", "INR" in the last 72 hours.  Studies/Results: DG Abd 1 View  Result Date: 10/27/2022 CLINICAL DATA:  82 year old male2 history of small-bowel obstruction. EXAM: ABDOMEN - 1 VIEW COMPARISON:  Abdominal radiograph 10/26/2022. FINDINGS: Nasogastric tube extends into the proximal stomach. Side port is not well visualized secondary to overlying spinal hardware, but is likely at the level of the gastroesophageal junction. Some dilated loops of small bowel are noted in the left side of the abdomen measuring  up to 4 cm in diameter. Some gas and stool are noted throughout the colon. Iodinated contrast material within the lumen of the urinary bladder. No definite pneumoperitoneum. IMPRESSION: 1. Support apparatus, as above. Nasogastric tube could be advanced approximately 10 cm for more optimal placement. 2. Persistent evidence of probable partial small bowel obstruction. Electronically Signed   By: Trudie Reed M.D.   On: 10/27/2022 06:12   DG Abd Portable 1 View  Result Date: 10/26/2022 CLINICAL DATA:  NG tube placement EXAM: PORTABLE ABDOMEN - 1 VIEW COMPARISON:  05/11/2021 FINDINGS: Limited field of view for tube placement verification. An enteric tube is present, extending along the midline to the upper mid abdomen. This likely indicates location in the upper stomach. No acute consolidation suggested in the lung bases. IMPRESSION: Enteric tube tip projects over the upper mid abdomen consistent with location in the upper stomach. Electronically Signed   By: Burman Nieves M.D.   On: 10/26/2022 20:41   CT ABDOMEN PELVIS W CONTRAST  Result Date: 10/26/2022 CLINICAL DATA:  Abdominal pain, nausea, vomiting EXAM: CT ABDOMEN AND PELVIS WITH CONTRAST TECHNIQUE: Multidetector CT imaging of the abdomen and pelvis was performed using the standard protocol following bolus administration of intravenous contrast. RADIATION DOSE REDUCTION: This exam was performed according to the departmental dose-optimization program which includes automated exposure control, adjustment of the mA and/or kV according to patient size and/or use of iterative reconstruction technique. CONTRAST:  OMNIPAQUE IOHEXOL 300 MG/ML  SOLN COMPARISON:  11/08/2020 FINDINGS:  Lower chest: Small linear densities are seen in lower lung fields suggesting scarring or subsegmental atelectasis. Extensive coronary artery calcifications are seen. Hepatobiliary: No focal abnormalities are seen in liver. Gallbladder is not distended. Pancreas: No focal  abnormalities are seen. Spleen: Unremarkable. Adrenals/Urinary Tract: Adrenals are unremarkable. There is no hydronephrosis. There are no renal or ureteral stones. There are few small low-density foci in the renal cortex on both sides measuring up to 9 mm in diameter suggesting renal cysts. Urinary bladder is not distended. Stomach/Bowel: There is fluid in the lumen of the thoracic esophagus. There is marked distention of stomach with air-fluid level. There is dilation of duodenum and proximal jejunal loops. Distal small-bowel loops are decompressed. Zone of transition appears to be in right mid abdomen. There is mixed attenuation material in the lumen of dilated small bowel loop close to the transition zone. Appendix is not dilated. There is no significant wall thickening in colon. Multiple diverticula are seen in colon without signs of focal acute diverticulitis. There is decompression of transverse and descending colon. Vascular/Lymphatic: Arterial calcifications are seen in aorta and its major branches. Reproductive: Prostate is enlarged. Other: There is no ascites or pneumoperitoneum. Small umbilical hernia containing fat is seen. Left inguinal hernia containing fat is seen. Musculoskeletal: There is surgical fusion from T9-L5 levels. IMPRESSION: There is marked distention of stomach with air-fluid level. There is fluid in the lumen of thoracic esophagus. There is abnormal dilation of proximal small bowel loops with decompression of distal small bowel loops. Findings suggest high-grade partial small bowel obstruction with zone of transition probably in right mid abdomen. This may be caused by adhesions or internal hernia. There is no hydronephrosis. Diverticulosis of colon without signs of focal diverticulitis. Aortic arteriosclerosis. Coronary artery calcifications are seen. Lumbar spondylosis. Postsurgical changes are noted in lower thoracic spine and lumbar spine. Possible small bilateral renal cysts.   Enlarged prostate. Other findings as described in the body of the report. Electronically Signed   By: Ernie Avena M.D.   On: 10/26/2022 19:40    Anti-infectives: Anti-infectives (From admission, onward)    None       Assessment/Plan:  Patient is an 82 year old male who was admitted to the hospital with concern for high-grade partial small bowel obstruction on CT imaging.  -Will plan to proceed with small bowel obstruction protocol this morning to fully rule out obstruction -NG tube clamped for Gastrografin administration.  After appropriate amount of time, and the return to low intermittent suction -NPO -IV fluids -Continue with Dulcolax suppositories -Await 8-hour KUB -If KUB demonstrates contrast within the colon, will remove NG tube and initiate clear liquid diet for patient -If no contrast is in the colon on KUB, will maintain NG tube with plan for repeat KUB tomorrow at 24 hours after Gastrografin administration -Appreciate hospitalist recommendations   LOS: 2 days    Lucca Ballo A Trust Crago 10/28/2022

## 2022-10-28 NOTE — Progress Notes (Signed)
Pt has been up and out of bed for most of the day. Pt has also had multiple BMs with no c/o pain or discomfort at this time. Will continue to monitor.

## 2022-10-28 NOTE — Progress Notes (Signed)
PROGRESS NOTE    [name]  ZOX:[id number] DOB: [birth year] DOA: 10/26/2022 PCP: Babs Sciara, MD   Brief Narrative:    [name] is a 82 y.o. male with medical history significant of arthritis, DVT, GERD, hypertension, hyperlipidemia, paroxysmal atrial fibrillation, PE, and more ED with a chief complaint of of nausea.  He was admitted with SBO and has NG tube to low intermittent suction.  He is being followed by general surgery for conservative management.  Assessment & Plan:   Principal Problem:   SBO (small bowel obstruction) (HCC) Active Problems:   ATRIAL FIBRILLATION, PAROXYSMAL   Hyperlipidemia   Gastroesophageal reflux disease  Assessment and Plan:   SBO (small bowel obstruction) (HCC) N.p.o., continue IV fluids, supportive care General surgery evaluation ongoing with small bowel follow-through ordered today   Gastroesophageal reflux disease - Transition to IV PPI   Hyperlipidemia - Holding statin until the patient is able to tolerate p.o.  Hypokalemia -Replete and reevaluate in a.m.   Paroxysmal A-fib, rate controlled - Resume Eliquis and metoprolol when patient is able to tolerate p.o. -Started on scheduled metoprolol IV every 8 hours   DVT prophylaxis: SCDs Code Status: Full Family Communication: None at bedside Disposition Plan:  Status is: Inpatient Remains inpatient appropriate because: Need for IV medications  Consultants:  General surgery  Procedures:  None  Antimicrobials:  None   Subjective: Patient seen and evaluated today with no new acute complaints or concerns.  He has had a bowel movement this morning and his abdomen is less distended.  He denies any nausea or vomiting and states that he is hungry.  Objective: Vitals:   10/27/22 1211 10/27/22 1959 10/28/22 0424 10/28/22 0835  BP: (!) 143/96 (!) 147/88 (!) 166/112 (!) 165/90  Pulse: 99 93 89 88  Resp: 18 20 19 16   Temp: 98.8 F (37.1 C) 98.6 F (37 C) 98.3 F (36.8  C) 98.3 F (36.8 C)  TempSrc: Oral Oral Oral Oral  SpO2: 90% (!) 88% 90% 94%    Intake/Output Summary (Last 24 hours) at 10/28/2022 1342 Last data filed at 10/28/2022 0424 Gross per 24 hour  Intake 1070.99 ml  Output 1700 ml  Net -629.01 ml   There were no vitals filed for this visit.  Examination:  General exam: Appears calm and comfortable  Respiratory system: Clear to auscultation. Respiratory effort normal. Cardiovascular system: S1 & S2 heard, RRR.  Gastrointestinal system: Abdomen is soft, NG tube to low intermittent suction Central nervous system: Alert and awake Extremities: No edema Skin: No significant lesions noted Psychiatry: Flat affect.    Data Reviewed: I have personally reviewed following labs and imaging studies  CBC: Recent Labs  Lab 10/26/22 1754 10/27/22 0402  WBC 10.1 9.3  NEUTROABS  --  7.4  HGB 15.4 13.7  HCT 45.7 40.9  MCV 93.1 93.6  PLT 270 259   Basic Metabolic Panel: Recent Labs  Lab 10/26/22 1754 10/27/22 0402 10/28/22 0414  NA 136 142 143  K 3.8 3.1* 2.9*  CL 96* 96* 101  CO2 26 32 30  GLUCOSE 196* 131* 114*  BUN 21 23 27*  CREATININE 1.08 1.11 1.02  CALCIUM 9.6 9.1 8.4*  MG  --  1.9 1.9   GFR: CrCl cannot be calculated (Unknown ideal weight.). Liver Function Tests: Recent Labs  Lab 10/26/22 1754 10/27/22 0402  AST 28 19  ALT 24 19  ALKPHOS 80 65  BILITOT 1.1 1.2  PROT 8.2* 7.1  ALBUMIN 4.8  4.0   Recent Labs  Lab 10/26/22 1754  LIPASE 38   No results for input(s): "AMMONIA" in the last 168 hours. Coagulation Profile: No results for input(s): "INR", "PROTIME" in the last 168 hours. Cardiac Enzymes: No results for input(s): "CKTOTAL", "CKMB", "CKMBINDEX", "TROPONINI" in the last 168 hours. BNP (last 3 results) No results for input(s): "PROBNP" in the last 8760 hours. HbA1C: No results for input(s): "HGBA1C" in the last 72 hours. CBG: No results for input(s): "GLUCAP" in the last 168 hours. Lipid  Profile: No results for input(s): "CHOL", "HDL", "LDLCALC", "TRIG", "CHOLHDL", "LDLDIRECT" in the last 72 hours. Thyroid Function Tests: No results for input(s): "TSH", "T4TOTAL", "FREET4", "T3FREE", "THYROIDAB" in the last 72 hours. Anemia Panel: No results for input(s): "VITAMINB12", "FOLATE", "FERRITIN", "TIBC", "IRON", "RETICCTPCT" in the last 72 hours. Sepsis Labs: No results for input(s): "PROCALCITON", "LATICACIDVEN" in the last 168 hours.  No results found for this or any previous visit (from the past 240 hour(s)).       Radiology Studies: DG Abd 1 View  Result Date: 10/27/2022 CLINICAL DATA:  82 year old male2 history of small-bowel obstruction. EXAM: ABDOMEN - 1 VIEW COMPARISON:  Abdominal radiograph 10/26/2022. FINDINGS: Nasogastric tube extends into the proximal stomach. Side port is not well visualized secondary to overlying spinal hardware, but is likely at the level of the gastroesophageal junction. Some dilated loops of small bowel are noted in the left side of the abdomen measuring up to 4 cm in diameter. Some gas and stool are noted throughout the colon. Iodinated contrast material within the lumen of the urinary bladder. No definite pneumoperitoneum. IMPRESSION: 1. Support apparatus, as above. Nasogastric tube could be advanced approximately 10 cm for more optimal placement. 2. Persistent evidence of probable partial small bowel obstruction. Electronically Signed   By: Trudie Reed M.D.   On: 10/27/2022 06:12   DG Abd Portable 1 View  Result Date: 10/26/2022 CLINICAL DATA:  NG tube placement EXAM: PORTABLE ABDOMEN - 1 VIEW COMPARISON:  05/11/2021 FINDINGS: Limited field of view for tube placement verification. An enteric tube is present, extending along the midline to the upper mid abdomen. This likely indicates location in the upper stomach. No acute consolidation suggested in the lung bases. IMPRESSION: Enteric tube tip projects over the upper mid abdomen consistent  with location in the upper stomach. Electronically Signed   By: Burman Nieves M.D.   On: 10/26/2022 20:41   CT ABDOMEN PELVIS W CONTRAST  Result Date: 10/26/2022 CLINICAL DATA:  Abdominal pain, nausea, vomiting EXAM: CT ABDOMEN AND PELVIS WITH CONTRAST TECHNIQUE: Multidetector CT imaging of the abdomen and pelvis was performed using the standard protocol following bolus administration of intravenous contrast. RADIATION DOSE REDUCTION: This exam was performed according to the departmental dose-optimization program which includes automated exposure control, adjustment of the mA and/or kV according to patient size and/or use of iterative reconstruction technique. CONTRAST:  OMNIPAQUE IOHEXOL 300 MG/ML  SOLN COMPARISON:  11/08/2020 FINDINGS: Lower chest: Small linear densities are seen in lower lung fields suggesting scarring or subsegmental atelectasis. Extensive coronary artery calcifications are seen. Hepatobiliary: No focal abnormalities are seen in liver. Gallbladder is not distended. Pancreas: No focal abnormalities are seen. Spleen: Unremarkable. Adrenals/Urinary Tract: Adrenals are unremarkable. There is no hydronephrosis. There are no renal or ureteral stones. There are few small low-density foci in the renal cortex on both sides measuring up to 9 mm in diameter suggesting renal cysts. Urinary bladder is not distended. Stomach/Bowel: There is fluid in the lumen  of the thoracic esophagus. There is marked distention of stomach with air-fluid level. There is dilation of duodenum and proximal jejunal loops. Distal small-bowel loops are decompressed. Zone of transition appears to be in right mid abdomen. There is mixed attenuation material in the lumen of dilated small bowel loop close to the transition zone. Appendix is not dilated. There is no significant wall thickening in colon. Multiple diverticula are seen in colon without signs of focal acute diverticulitis. There is decompression of transverse  and descending colon. Vascular/Lymphatic: Arterial calcifications are seen in aorta and its major branches. Reproductive: Prostate is enlarged. Other: There is no ascites or pneumoperitoneum. Small umbilical hernia containing fat is seen. Left inguinal hernia containing fat is seen. Musculoskeletal: There is surgical fusion from T9-L5 levels. IMPRESSION: There is marked distention of stomach with air-fluid level. There is fluid in the lumen of thoracic esophagus. There is abnormal dilation of proximal small bowel loops with decompression of distal small bowel loops. Findings suggest high-grade partial small bowel obstruction with zone of transition probably in right mid abdomen. This may be caused by adhesions or internal hernia. There is no hydronephrosis. Diverticulosis of colon without signs of focal diverticulitis. Aortic arteriosclerosis. Coronary artery calcifications are seen. Lumbar spondylosis. Postsurgical changes are noted in lower thoracic spine and lumbar spine. Possible small bilateral renal cysts.  Enlarged prostate. Other findings as described in the body of the report. Electronically Signed   By: Ernie Avena M.D.   On: 10/26/2022 19:40        Scheduled Meds:  bisacodyl  10 mg Rectal Daily   latanoprost  1 drop Both Eyes QHS   metoprolol tartrate  5 mg Intravenous Q8H   pantoprazole (PROTONIX) IV  40 mg Intravenous Q24H   Continuous Infusions:  sodium chloride 100 mL/hr at 10/28/22 0320   potassium chloride 10 mEq (10/28/22 1246)     LOS: 2 days    Time spent: 35 minutes    Kedron Uno Hoover Brunette, DO [facility]  If 7PM-7AM, please contact night-coverage [email or web address] 10/28/2022, 1:42 PM

## 2022-10-28 NOTE — Progress Notes (Signed)
NG tube removed per order from Dr. Robyne Peers, patient tolerated removal well

## 2022-10-29 DIAGNOSIS — K56609 Unspecified intestinal obstruction, unspecified as to partial versus complete obstruction: Secondary | ICD-10-CM | POA: Diagnosis not present

## 2022-10-29 LAB — CBC
HCT: 38 % — ABNORMAL LOW (ref 39.0–52.0)
Hemoglobin: 12.5 g/dL — ABNORMAL LOW (ref 13.0–17.0)
MCH: 31.7 pg (ref 26.0–34.0)
MCHC: 32.9 g/dL (ref 30.0–36.0)
MCV: 96.4 fL (ref 80.0–100.0)
Platelets: 218 10*3/uL (ref 150–400)
RBC: 3.94 MIL/uL — ABNORMAL LOW (ref 4.22–5.81)
RDW: 13.7 % (ref 11.5–15.5)
WBC: 8.5 10*3/uL (ref 4.0–10.5)
nRBC: 0 % (ref 0.0–0.2)

## 2022-10-29 LAB — BASIC METABOLIC PANEL
Anion gap: 10 (ref 5–15)
BUN: 22 mg/dL (ref 8–23)
CO2: 26 mmol/L (ref 22–32)
Calcium: 8.5 mg/dL — ABNORMAL LOW (ref 8.9–10.3)
Chloride: 104 mmol/L (ref 98–111)
Creatinine, Ser: 0.92 mg/dL (ref 0.61–1.24)
GFR, Estimated: >60 mL/min (ref >60)
Glucose, Bld: 99 mg/dL (ref 70–99)
Potassium: 3.4 mmol/L — ABNORMAL LOW (ref 3.5–5.1)
Sodium: 140 mmol/L (ref 135–145)

## 2022-10-29 LAB — MAGNESIUM: Magnesium: 1.9 mg/dL (ref 1.7–2.4)

## 2022-10-29 MED ORDER — POLYVINYL ALCOHOL 1.4 % OP SOLN
1.0000 [drp] | OPHTHALMIC | Status: DC | PRN
  Administered 2022-10-29: 1 [drp] via OPHTHALMIC
  Filled 2022-10-29: qty 15

## 2022-10-29 MED ORDER — ACETAMINOPHEN 325 MG PO TABS
650.0000 mg | ORAL_TABLET | Freq: Four times a day (QID) | ORAL | Status: DC | PRN
  Administered 2022-10-29: 650 mg via ORAL
  Filled 2022-10-29: qty 2

## 2022-10-29 MED ORDER — POTASSIUM CHLORIDE CRYS ER 20 MEQ PO TBCR
40.0000 meq | EXTENDED_RELEASE_TABLET | Freq: Two times a day (BID) | ORAL | Status: DC
  Administered 2022-10-29: 40 meq via ORAL
  Filled 2022-10-29: qty 2

## 2022-10-29 NOTE — Discharge Summary (Signed)
Physician Discharge Summary  STATEN SALMO WUJ:[id number] DOB: [birth year] DOA: 10/26/2022  PCP: Babs Sciara, MD  Admit date: 10/26/2022  Discharge date: 10/29/2022  Admitted From:Home  Disposition:  Home  Recommendations for Outpatient Follow-up:  Follow up with PCP in 1-2 weeks Continue on medications as prior  Home Health: None  Equipment/Devices: None  Discharge Condition:Stable  CODE STATUS: Full  Diet recommendation: Heart Healthy  Brief/Interim Summary:  [name] is a 82 y.o. male with medical history significant of arthritis, DVT, GERD, hypertension, hyperlipidemia, paroxysmal atrial fibrillation, PE, and more ED with a chief complaint of of nausea.  He was admitted with SBO and has NG tube to low intermittent suction.  He was being followed by general surgery for conservative management and is now in stable condition for discharge as he is tolerating soft diet.  He may advance his diet further at home.  No other acute events or concerns noted throughout the course of the stay.  Discharge Diagnoses:  Principal Problem:   SBO (small bowel obstruction) (HCC) Active Problems:   ATRIAL FIBRILLATION, PAROXYSMAL   Hyperlipidemia   Gastroesophageal reflux disease  Principal discharge diagnosis: Small bowel obstruction.  Discharge Instructions  Discharge Instructions     Diet - low sodium heart healthy   Complete by: As directed    Increase activity slowly   Complete by: As directed       Allergies as of 10/29/2022       Reactions   Sectral [acebutolol Hcl] Other (See Comments)   Unknown reaction   Sulfonamide Derivatives Other (See Comments)   Runs blood pressure up   Zocor [simvastatin] Other (See Comments)   Joint pain        Medication List     TAKE these medications    amLODipine 10 MG tablet Commonly known as: NORVASC TAKE 1 TABLET BY MOUTH EVERY DAY   Eliquis 5 MG Tabs tablet Generic drug: apixaban TAKE 1 TABLET BY MOUTH TWICE A  DAY. STOP PRADAXA What changed: See the new instructions.   indapamide 1.25 MG tablet Commonly known as: LOZOL Take 1 tablet (1.25 mg total) by mouth daily.   latanoprost 0.005 % ophthalmic solution Commonly known as: XALATAN Place 1 drop into both eyes at bedtime.   Metamucil Smooth Texture 58.6 % powder Generic drug: psyllium Take 1 packet by mouth at bedtime.   metoprolol succinate 25 MG 24 hr tablet Commonly known as: TOPROL-XL Take 1 tablet (25 mg total) by mouth 2 (two) times daily.   Oxycodone HCl 10 MG Tabs 1 taken 4 times daily as needed for pain caution drowsiness What changed:  how much to take how to take this when to take this reasons to take this additional instructions   pantoprazole 40 MG tablet Commonly known as: PROTONIX TAKE 1 TABLET BY MOUTH EVERY DAY BEFORE BREAKFAST What changed: See the new instructions.   pravastatin 80 MG tablet Commonly known as: PRAVACHOL TAKE 1 TABLET BY MOUTH EVERYDAY AT BEDTIME What changed: See the new instructions.   ramipril 10 MG capsule Commonly known as: ALTACE Take 1 capsule (10 mg total) by mouth 2 (two) times daily.   tamsulosin 0.4 MG Caps capsule Commonly known as: FLOMAX Take 1 capsule (0.4 mg total) by mouth daily.   torsemide 10 MG tablet Commonly known as: DEMADEX Take 1 tablet (10 mg total) by mouth daily.   Xdemvy 0.25 % Soln Generic drug: Lotilaner Apply to eye.  Follow-up Information     Luking, Jonna Coup, MD. Schedule an appointment as soon as possible for a visit in 1 week(s).   Specialty: Family Medicine Contact information: [address] [place] [address] [phone]                Allergies  Allergen Reactions   Sectral [Acebutolol Hcl] Other (See Comments)    Unknown reaction   Sulfonamide Derivatives Other (See Comments)    Runs blood pressure up   Zocor [Simvastatin] Other (See Comments)    Joint pain    Consultations: General  surgery   Procedures/Studies: DG Abd Portable 1V-Small Bowel Obstruction Protocol-initial, 8 hr delay  Result Date: 10/28/2022 CLINICAL DATA:  Small-bowel obstruction, 8 hour delay protocol EXAM: PORTABLE ABDOMEN - 1 VIEW COMPARISON:  Abdomen radiographs done on 10/27/2022 FINDINGS: Bowel gas pattern is nonspecific. Oral contrast has reached colon and rectum. There is no significant residual contrast in small bowel loops. Tip of NG tube is seen in the region of stomach. Side port in NG tube is noted at the gastroesophageal junction. There is surgical fusion in lower thoracic spine and lumbar spine. IMPRESSION: Nonspecific bowel gas pattern. Oral contrast has reached colon and rectum. Side-port in NG tube is noted at the gastroesophageal junction. Electronically Signed   By: Ernie Avena M.D.   On: 10/28/2022 20:08   DG Abd 1 View  Result Date: 10/27/2022 CLINICAL DATA:  82 year old male2 history of small-bowel obstruction. EXAM: ABDOMEN - 1 VIEW COMPARISON:  Abdominal radiograph 10/26/2022. FINDINGS: Nasogastric tube extends into the proximal stomach. Side port is not well visualized secondary to overlying spinal hardware, but is likely at the level of the gastroesophageal junction. Some dilated loops of small bowel are noted in the left side of the abdomen measuring up to 4 cm in diameter. Some gas and stool are noted throughout the colon. Iodinated contrast material within the lumen of the urinary bladder. No definite pneumoperitoneum. IMPRESSION: 1. Support apparatus, as above. Nasogastric tube could be advanced approximately 10 cm for more optimal placement. 2. Persistent evidence of probable partial small bowel obstruction. Electronically Signed   By: Trudie Reed M.D.   On: 10/27/2022 06:12   DG Abd Portable 1 View  Result Date: 10/26/2022 CLINICAL DATA:  NG tube placement EXAM: PORTABLE ABDOMEN - 1 VIEW COMPARISON:  05/11/2021 FINDINGS: Limited field of view for tube placement  verification. An enteric tube is present, extending along the midline to the upper mid abdomen. This likely indicates location in the upper stomach. No acute consolidation suggested in the lung bases. IMPRESSION: Enteric tube tip projects over the upper mid abdomen consistent with location in the upper stomach. Electronically Signed   By: Burman Nieves M.D.   On: 10/26/2022 20:41   CT ABDOMEN PELVIS W CONTRAST  Result Date: 10/26/2022 CLINICAL DATA:  Abdominal pain, nausea, vomiting EXAM: CT ABDOMEN AND PELVIS WITH CONTRAST TECHNIQUE: Multidetector CT imaging of the abdomen and pelvis was performed using the standard protocol following bolus administration of intravenous contrast. RADIATION DOSE REDUCTION: This exam was performed according to the departmental dose-optimization program which includes automated exposure control, adjustment of the mA and/or kV according to patient size and/or use of iterative reconstruction technique. CONTRAST:  OMNIPAQUE IOHEXOL 300 MG/ML  SOLN COMPARISON:  11/08/2020 FINDINGS: Lower chest: Small linear densities are seen in lower lung fields suggesting scarring or subsegmental atelectasis. Extensive coronary artery calcifications are seen. Hepatobiliary: No focal abnormalities are seen in liver. Gallbladder is  not distended. Pancreas: No focal abnormalities are seen. Spleen: Unremarkable. Adrenals/Urinary Tract: Adrenals are unremarkable. There is no hydronephrosis. There are no renal or ureteral stones. There are few small low-density foci in the renal cortex on both sides measuring up to 9 mm in diameter suggesting renal cysts. Urinary bladder is not distended. Stomach/Bowel: There is fluid in the lumen of the thoracic esophagus. There is marked distention of stomach with air-fluid level. There is dilation of duodenum and proximal jejunal loops. Distal small-bowel loops are decompressed. Zone of transition appears to be in right mid abdomen. There is mixed attenuation  material in the lumen of dilated small bowel loop close to the transition zone. Appendix is not dilated. There is no significant wall thickening in colon. Multiple diverticula are seen in colon without signs of focal acute diverticulitis. There is decompression of transverse and descending colon. Vascular/Lymphatic: Arterial calcifications are seen in aorta and its major branches. Reproductive: Prostate is enlarged. Other: There is no ascites or pneumoperitoneum. Small umbilical hernia containing fat is seen. Left inguinal hernia containing fat is seen. Musculoskeletal: There is surgical fusion from T9-L5 levels. IMPRESSION: There is marked distention of stomach with air-fluid level. There is fluid in the lumen of thoracic esophagus. There is abnormal dilation of proximal small bowel loops with decompression of distal small bowel loops. Findings suggest high-grade partial small bowel obstruction with zone of transition probably in right mid abdomen. This may be caused by adhesions or internal hernia. There is no hydronephrosis. Diverticulosis of colon without signs of focal diverticulitis. Aortic arteriosclerosis. Coronary artery calcifications are seen. Lumbar spondylosis. Postsurgical changes are noted in lower thoracic spine and lumbar spine. Possible small bilateral renal cysts.  Enlarged prostate. Other findings as described in the body of the report. Electronically Signed   By: Ernie Avena M.D.   On: 10/26/2022 19:40   DG Chest 2 View  Result Date: 10/14/2022 CLINICAL DATA:  82 year old male with dyspnea on exertion EXAM: CHEST - 2 VIEW COMPARISON:  02/18/2022 FINDINGS: Cardiomediastinal silhouette unchanged in size and contour. No evidence of central vascular congestion. No interlobular septal thickening. Asymmetric elevation of the right hemidiaphragm again noted. No pneumothorax or pleural effusion. Coarsened interstitial markings, with no confluent airspace disease. No acute displaced fracture.  Degenerative changes of the spine. Surgical changes of the cervical region and thoracolumbar region incompletely imaged. IMPRESSION: Negative for acute cardiopulmonary disease Electronically Signed   By: Gilmer Mor D.O.   On: 10/14/2022 11:53     Discharge Exam: Vitals:   10/29/22 0449 10/29/22 1303  BP: (!) 158/82 136/68  Pulse: 82 77  Resp: 20 (!) 24  Temp: 98.3 F (36.8 C) (!) 97.5 F (36.4 C)  SpO2: 95% 96%   Vitals:   10/28/22 1532 10/28/22 2021 10/29/22 0449 10/29/22 1303  BP: (!) 185/98 (!) 166/98 (!) 158/82 136/68  Pulse: 92 77 82 77  Resp: 18 18 20  (!) 24  Temp: 98.4 F (36.9 C) 98 F (36.7 C) 98.3 F (36.8 C) (!) 97.5 F (36.4 C)  TempSrc: Oral   Oral  SpO2: 95% 96% 95% 96%    General: Pt is alert, awake, not in acute distress Cardiovascular: RRR, S1/S2 +, no rubs, no gallops Respiratory: CTA bilaterally, no wheezing, no rhonchi Abdominal: Soft, NT, ND, bowel sounds + Extremities: no edema, no cyanosis    The results of significant diagnostics from this hospitalization (including imaging, microbiology, ancillary and laboratory) are listed below for reference.     Microbiology: No results  found for this or any previous visit (from the past 240 hour(s)).   Labs: BNP (last 3 results) Recent Labs    02/18/22 1207 03/16/22 1357 10/13/22 1202  BNP 206.0* 326.0* 139.1*   Basic Metabolic Panel: Recent Labs  Lab 10/26/22 1754 10/27/22 0402 10/28/22 0414 10/29/22 0406  NA 136 142 143 140  K 3.8 3.1* 2.9* 3.4*  CL 96* 96* 101 104  CO2 26 32 30 26  GLUCOSE 196* 131* 114* 99  BUN 21 23 27* 22  CREATININE 1.08 1.11 1.02 0.92  CALCIUM 9.6 9.1 8.4* 8.5*  MG  --  1.9 1.9 1.9   Liver Function Tests: Recent Labs  Lab 10/26/22 1754 10/27/22 0402  AST 28 19  ALT 24 19  ALKPHOS 80 65  BILITOT 1.1 1.2  PROT 8.2* 7.1  ALBUMIN 4.8 4.0   Recent Labs  Lab 10/26/22 1754  LIPASE 38   No results for input(s): "AMMONIA" in the last 168  hours. CBC: Recent Labs  Lab 10/26/22 1754 10/27/22 0402 10/29/22 0406  WBC 10.1 9.3 8.5  NEUTROABS  --  7.4  --   HGB 15.4 13.7 12.5*  HCT 45.7 40.9 38.0*  MCV 93.1 93.6 96.4  PLT 270 259 218   Cardiac Enzymes: No results for input(s): "CKTOTAL", "CKMB", "CKMBINDEX", "TROPONINI" in the last 168 hours. BNP: Invalid input(s): "POCBNP" CBG: No results for input(s): "GLUCAP" in the last 168 hours. D-Dimer No results for input(s): "DDIMER" in the last 72 hours. Hgb A1c No results for input(s): "HGBA1C" in the last 72 hours. Lipid Profile No results for input(s): "CHOL", "HDL", "LDLCALC", "TRIG", "CHOLHDL", "LDLDIRECT" in the last 72 hours. Thyroid function studies No results for input(s): "TSH", "T4TOTAL", "T3FREE", "THYROIDAB" in the last 72 hours.  Invalid input(s): "FREET3" Anemia work up No results for input(s): "VITAMINB12", "FOLATE", "FERRITIN", "TIBC", "IRON", "RETICCTPCT" in the last 72 hours. Urinalysis    Component Value Date/Time   COLORURINE YELLOW 05/20/2021 1544   APPEARANCEUR Clear 05/21/2022 1047   LABSPEC 1.015 05/20/2021 1544   PHURINE 6.5 05/20/2021 1544   GLUCOSEU Negative 05/21/2022 1047   HGBUR NEGATIVE 05/20/2021 1544   BILIRUBINUR Negative 05/21/2022 1047   KETONESUR negative 03/17/2022 1601   KETONESUR NEGATIVE 05/20/2021 1544   PROTEINUR Trace 05/21/2022 1047   PROTEINUR 30 (A) 05/20/2021 1544   UROBILINOGEN 0.2 03/17/2022 1601   NITRITE Negative 05/21/2022 1047   NITRITE NEGATIVE 05/20/2021 1544   LEUKOCYTESUR Negative 05/21/2022 1047   LEUKOCYTESUR NEGATIVE 05/20/2021 1544   Sepsis Labs Recent Labs  Lab 10/26/22 1754 10/27/22 0402 10/29/22 0406  WBC 10.1 9.3 8.5   Microbiology No results found for this or any previous visit (from the past 240 hour(s)).   Time coordinating discharge: 35 minutes  SIGNED:   Erick Blinks, DO [facility] 10/29/2022, 1:28 PM  If 7PM-7AM, please contact night-coverage [email or web address]

## 2022-10-29 NOTE — Care Management Important Message (Signed)
Important Message  Patient Details  Name: [name] MRN: [id number] Date of Birth: November 05, 1940   Medicare Important Message Given:  N/A - LOS <3 / Initial given by admissions     Corey Harold 10/29/2022, 1:25 PM

## 2022-10-29 NOTE — Progress Notes (Signed)
[facility] Progress Note     Subjective: Patient seen and examined.  He is resting comfortably in bed.  He has had multiple loose bowel movements and is passing flatus.  He is tolerating clears without nausea and vomiting.  Denies abdominal pain.  Objective: Vital signs in last 24 hours: Temp:  [98 F (36.7 C)-98.4 F (36.9 C)] 98.3 F (36.8 C) (06/13 0449) Pulse Rate:  [77-92] 82 (06/13 0449) Resp:  [18-20] 20 (06/13 0449) BP: (158-185)/(82-98) 158/82 (06/13 0449) SpO2:  [95 %-96 %] 95 % (06/13 0449) Last BM Date : 10/28/22  Intake/Output from previous day: 06/12 0701 - 06/13 0700 In: 2680 [P.O.:580; I.V.:1900.8; IV Piggyback:199.2] Out: 350 [Emesis/NG output:350] Intake/Output this shift: No intake/output data recorded.  General appearance: alert, cooperative, and no distress GI: Abdomen soft, nondistended, no percussion tenderness, nontender to palpation; no rigidity, guarding, or rebound tenderness  Lab Results:  Recent Labs    10/27/22 0402 10/29/22 0406  WBC 9.3 8.5  HGB 13.7 12.5*  HCT 40.9 38.0*  PLT 259 218   BMET Recent Labs    10/28/22 0414 10/29/22 0406  NA 143 140  K 2.9* 3.4*  CL 101 104  CO2 30 26  GLUCOSE 114* 99  BUN 27* 22  CREATININE 1.02 0.92  CALCIUM 8.4* 8.5*   PT/INR No results for input(s): "LABPROT", "INR" in the last 72 hours.  Studies/Results: DG Abd Portable 1V-Small Bowel Obstruction Protocol-initial, 8 hr delay  Result Date: 10/28/2022 CLINICAL DATA:  Small-bowel obstruction, 8 hour delay protocol EXAM: PORTABLE ABDOMEN - 1 VIEW COMPARISON:  Abdomen radiographs done on 10/27/2022 FINDINGS: Bowel gas pattern is nonspecific. Oral contrast has reached colon and rectum. There is no significant residual contrast in small bowel loops. Tip of NG tube is seen in the region of stomach. Side port in NG tube is noted at the gastroesophageal junction. There is surgical fusion in lower thoracic spine and lumbar spine.  IMPRESSION: Nonspecific bowel gas pattern. Oral contrast has reached colon and rectum. Side-port in NG tube is noted at the gastroesophageal junction. Electronically Signed   By: Ernie Avena M.D.   On: 10/28/2022 20:08    Anti-infectives: Anti-infectives (From admission, onward)    None       Assessment/Plan:  Patient is an 82 year old male who was admitted to the hospital with concern for high-grade partial small bowel obstruction on CT.  -He underwent [name] small bowel obstruction protocol yesterday which demonstrated contrast throughout his colon on the 8-hour film -NG tube was removed and patient tolerated clear liquids -Advanced to full liquids -If patient tolerates fulls, and may be advanced to GI soft -IV fluids hep-locked -PRN pain control and antiemetics -Continue to monitor patient's bowel function -If patient tolerates soft diet, stable for discharge from general surgery standpoint   LOS: 3 days    [name] [name] Welcome 10/29/2022

## 2022-10-29 NOTE — Progress Notes (Signed)
Ng Discharge Note  Admit Date:  10/26/2022 Discharge date: 10/29/2022   [name] to be D/C'd Home per MD order.  AVS completed. Patient/caregiver able to verbalize understanding.  Discharge Medication: Allergies as of 10/29/2022       Reactions   Sectral [acebutolol Hcl] Other (See Comments)   Unknown reaction   Sulfonamide Derivatives Other (See Comments)   Runs blood pressure up   Zocor [simvastatin] Other (See Comments)   Joint pain        Medication List     TAKE these medications    amLODipine 10 MG tablet Commonly known as: NORVASC TAKE 1 TABLET BY MOUTH EVERY DAY   Eliquis 5 MG Tabs tablet Generic drug: apixaban TAKE 1 TABLET BY MOUTH TWICE A DAY. STOP PRADAXA What changed: See the new instructions.   indapamide 1.25 MG tablet Commonly known as: LOZOL Take 1 tablet (1.25 mg total) by mouth daily.   latanoprost 0.005 % ophthalmic solution Commonly known as: XALATAN Place 1 drop into both eyes at bedtime.   Metamucil Smooth Texture 58.6 % powder Generic drug: psyllium Take 1 packet by mouth at bedtime.   metoprolol succinate 25 MG 24 hr tablet Commonly known as: TOPROL-XL Take 1 tablet (25 mg total) by mouth 2 (two) times daily.   Oxycodone HCl 10 MG Tabs 1 taken 4 times daily as needed for pain caution drowsiness What changed:  how much to take how to take this when to take this reasons to take this additional instructions   pantoprazole 40 MG tablet Commonly known as: PROTONIX TAKE 1 TABLET BY MOUTH EVERY DAY BEFORE BREAKFAST What changed: See the new instructions.   pravastatin 80 MG tablet Commonly known as: PRAVACHOL TAKE 1 TABLET BY MOUTH EVERYDAY AT BEDTIME What changed: See the new instructions.   ramipril 10 MG capsule Commonly known as: ALTACE Take 1 capsule (10 mg total) by mouth 2 (two) times daily.   tamsulosin 0.4 MG Caps capsule Commonly known as: FLOMAX Take 1 capsule (0.4 mg total) by mouth daily.   torsemide 10  MG tablet Commonly known as: DEMADEX Take 1 tablet (10 mg total) by mouth daily.   Xdemvy 0.25 % Soln Generic drug: Lotilaner Apply to eye.        Discharge Assessment: Vitals:   10/29/22 0449 10/29/22 1303  BP: (!) 158/82 136/68  Pulse: 82 77  Resp: 20 (!) 24  Temp: 98.3 F (36.8 C) (!) 97.5 F (36.4 C)  SpO2: 95% 96%   Skin clean, dry and intact without evidence of skin break down, no evidence of skin tears noted. IV catheter discontinued intact. Site without signs and symptoms of complications - no redness or edema noted at insertion site, patient denies c/o pain - only slight tenderness at site.  Dressing with slight pressure applied.  D/c Instructions-Education: Discharge instructions given to patient/family with verbalized understanding. D/c education completed with patient/family including follow up instructions, medication list, d/c activities limitations if indicated, with other d/c instructions as indicated by MD - patient able to verbalize understanding, all questions fully answered. Patient instructed to return to ED, call 911, or call MD for any changes in condition.  Patient escorted via WC, and D/C home via private auto.  [name], LPN 5/62/1308 6:57 PM

## 2022-10-30 ENCOUNTER — Telehealth: Payer: Self-pay

## 2022-10-30 NOTE — Transitions of Care (Post Inpatient/ED Visit) (Signed)
   10/30/2022  Name: JURGEN SATKOWIAK MRN: [id number] DOB: 05-22-1940  Today's TOC FU Call Status: Today's TOC FU Call Status:: Unsuccessul Call (1st Attempt) Unsuccessful Call (1st Attempt) Date: 10/30/22  Attempted to reach the patient regarding the most recent Inpatient/ED visit.  Follow Up Plan: Additional outreach attempts will be made to reach the patient to complete the Transitions of Care (Post Inpatient/ED visit) call.   Jodelle Gross, RN, BSN, CCM Care Management Coordinator [place] Phone: [phone]/Fax: [phone]

## 2022-11-02 ENCOUNTER — Encounter: Payer: Self-pay | Admitting: Family Medicine

## 2022-11-02 ENCOUNTER — Ambulatory Visit (INDEPENDENT_AMBULATORY_CARE_PROVIDER_SITE_OTHER): Payer: PPO | Admitting: Family Medicine

## 2022-11-02 VITALS — BP 122/80 | HR 75 | Wt 200.6 lb

## 2022-11-02 DIAGNOSIS — M549 Dorsalgia, unspecified: Secondary | ICD-10-CM | POA: Diagnosis not present

## 2022-11-02 DIAGNOSIS — R635 Abnormal weight gain: Secondary | ICD-10-CM | POA: Diagnosis not present

## 2022-11-02 DIAGNOSIS — G8929 Other chronic pain: Secondary | ICD-10-CM | POA: Diagnosis not present

## 2022-11-02 DIAGNOSIS — K56609 Unspecified intestinal obstruction, unspecified as to partial versus complete obstruction: Secondary | ICD-10-CM

## 2022-11-02 DIAGNOSIS — E876 Hypokalemia: Secondary | ICD-10-CM | POA: Diagnosis not present

## 2022-11-02 DIAGNOSIS — Z8719 Personal history of other diseases of the digestive system: Secondary | ICD-10-CM | POA: Diagnosis not present

## 2022-11-02 NOTE — Progress Notes (Signed)
   Subjective:    Patient ID: [name], male    DOB: [birth year], 82 y.o.   MRN: [id number]  HPI Patient arrives today for hospital follow up.  Patient was in the hospital with partial small bowel obstruction this did resolve but he feels a lot of tightness in his abdomen along with some difficulty having bowel movements What complicates factors as he has severe back disorder that he does not get decent relief of pain or discomfort with plain Tylenol so therefore for many years he has been on oxycodone We have counseled him today that it would be in his best interest to try to taper down on the oxycodone currently he is doing 4 tablets a day of the 10 mg He does not want to go to 7.5 mg 4 times per day He is willing to try to taper down on the 10 mg He is trying to eat soft diet along with vegetables fruits and fiber along with MiraLAX and Metamucil He does eat some meats but tries to limit the portions  Hospital notes scanned labs all reviewed Review of Systems     Objective:   Physical Exam General-in no acute distress Eyes-no discharge Lungs-respiratory rate normal, CTA CV-no murmurs,RRR Extremities skin warm dry no edema Neuro grossly normal Behavior normal, alert  Abdomen is soft but subjectively he relates that it feels tight      Assessment & Plan:  1. Hypokalemia Check lab work await the results - Basic Metabolic Panel (7) - Magnesium  2. History of small bowel obstruction We will progress forward with getting an appointment with gastroenterology.  He would like to get evaluated for the possibility of getting a colonoscopy.  I really doubt that that would solve his issue I think his issue is a combination of slow transit time related to pain medicines as well as possibly small bowel adhesions  - Ambulatory referral to Gastroenterology  3. Weight gain Healthy diet cutting back on juices portion control recommended  4. Chronic back pain greater than 3 months  duration I am sympathetic to his back pain He cannot just stop taking pain medicine but on the other side of the coin continuing at the current dose would not be in his advantage I recommend that he reduce this He is hesitant to reduce but is willing to try 10 mg tablet oxycodone is currently being prescribed I recommend 1 in the morning, half a tablet at lunch, 1 later in the afternoon right before supper Then a half a tablet in the evening if he follows that instruction it would reduce oxycodone to 30 mg daily instead of 40 mg He will do lab works as ordered He will follow-up in 4 to 6 weeks

## 2022-11-03 ENCOUNTER — Telehealth: Payer: Self-pay

## 2022-11-03 ENCOUNTER — Encounter: Payer: Self-pay | Admitting: Family Medicine

## 2022-11-03 LAB — BASIC METABOLIC PANEL (7)
BUN/Creatinine Ratio: 15 (ref 10–24)
BUN: 17 mg/dL (ref 8–27)
CO2: 24 mmol/L (ref 20–29)
Chloride: 101 mmol/L (ref 96–106)
Creatinine, Ser: 1.12 mg/dL (ref 0.76–1.27)
Glucose: 78 mg/dL (ref 70–99)
Potassium: 4.4 mmol/L (ref 3.5–5.2)
Sodium: 141 mmol/L (ref 134–144)
eGFR: 66 mL/min/{1.73_m2} (ref >59)

## 2022-11-03 LAB — MAGNESIUM: Magnesium: 2.2 mg/dL (ref 1.6–2.3)

## 2022-11-03 NOTE — Progress Notes (Signed)
Please mail to patient not sure he utilizes [employer]

## 2022-11-03 NOTE — Transitions of Care (Post Inpatient/ED Visit) (Signed)
   11/03/2022  Name: [name] MRN: [id number] DOB: 10/07/1940  Today's TOC FU Call Status: Today's TOC FU Call Status:: Unsuccessful Call (2nd Attempt) Unsuccessful Call (2nd Attempt) Date: 11/03/22  Attempted to reach the patient regarding the most recent Inpatient/ED visit.  Follow Up Plan: Additional outreach attempts will be made to reach the patient to complete the Transitions of Care (Post Inpatient/ED visit) call.   Jodelle Gross, RN, BSN, CCM Care Management Coordinator [place] Phone: [phone]/Fax: [phone]

## 2022-11-04 ENCOUNTER — Encounter: Payer: Self-pay | Admitting: Family Medicine

## 2022-11-04 ENCOUNTER — Telehealth: Payer: Self-pay

## 2022-11-04 NOTE — Transitions of Care (Post Inpatient/ED Visit) (Signed)
   11/04/2022  Name: [name] MRN: [id number] DOB: 1940-07-31  Today's TOC FU Call Status: Today's TOC FU Call Status:: Unsuccessful Call (3rd Attempt) Unsuccessful Call (3rd Attempt) Date: 11/04/22  Attempted to reach the patient regarding the most recent Inpatient/ED visit.  Follow Up Plan: No further outreach attempts will be made at this time. We have been unable to contact the patient.  Jodelle Gross, RN, BSN, CCM Care Management Coordinator [place]

## 2022-11-04 NOTE — Progress Notes (Signed)
Please mail to patient

## 2022-11-16 ENCOUNTER — Telehealth (INDEPENDENT_AMBULATORY_CARE_PROVIDER_SITE_OTHER): Payer: Self-pay | Admitting: Gastroenterology

## 2022-11-16 NOTE — Telephone Encounter (Signed)
Would you like him to stay with Dr.Castaneda?

## 2022-11-16 NOTE — Telephone Encounter (Signed)
Pt called in and would like to schedule colonoscopy. Pt was seen in office 10/01/22. Please advise. Thank you

## 2022-11-16 NOTE — Telephone Encounter (Signed)
ASA 3? Please advise. Thank you 

## 2022-11-16 NOTE — Telephone Encounter (Signed)
Would you like him to stay with Dr.Castaneda?   (I keep forgetting to put your name in; I apologize)

## 2022-11-16 NOTE — Telephone Encounter (Signed)
ASA 3? Please advise. Thank you

## 2022-11-17 NOTE — Telephone Encounter (Signed)
Pt contacted and made aware that we do not have any rooms at this time but once I get ASA 3 available I will give him in a call. Pt verbalized understanding

## 2022-11-26 ENCOUNTER — Encounter: Payer: Self-pay | Admitting: Urology

## 2022-11-26 ENCOUNTER — Ambulatory Visit (INDEPENDENT_AMBULATORY_CARE_PROVIDER_SITE_OTHER): Payer: PPO | Admitting: Urology

## 2022-11-26 ENCOUNTER — Encounter (INDEPENDENT_AMBULATORY_CARE_PROVIDER_SITE_OTHER): Payer: Self-pay | Admitting: Gastroenterology

## 2022-11-26 ENCOUNTER — Ambulatory Visit (INDEPENDENT_AMBULATORY_CARE_PROVIDER_SITE_OTHER): Payer: PPO | Admitting: Gastroenterology

## 2022-11-26 VITALS — BP 109/67 | HR 74 | Temp 97.9°F | Ht 66.0 in | Wt 203.6 lb

## 2022-11-26 VITALS — BP 144/88 | HR 86

## 2022-11-26 DIAGNOSIS — R351 Nocturia: Secondary | ICD-10-CM

## 2022-11-26 DIAGNOSIS — K5903 Drug induced constipation: Secondary | ICD-10-CM

## 2022-11-26 DIAGNOSIS — R3915 Urgency of urination: Secondary | ICD-10-CM

## 2022-11-26 DIAGNOSIS — R350 Frequency of micturition: Secondary | ICD-10-CM

## 2022-11-26 DIAGNOSIS — N3941 Urge incontinence: Secondary | ICD-10-CM

## 2022-11-26 DIAGNOSIS — Z8719 Personal history of other diseases of the digestive system: Secondary | ICD-10-CM

## 2022-11-26 DIAGNOSIS — N401 Enlarged prostate with lower urinary tract symptoms: Secondary | ICD-10-CM

## 2022-11-26 LAB — URINALYSIS, ROUTINE W REFLEX MICROSCOPIC
Bilirubin, UA: NEGATIVE
Glucose, UA: NEGATIVE
Ketones, UA: NEGATIVE
Leukocytes,UA: NEGATIVE
Nitrite, UA: NEGATIVE
Protein,UA: NEGATIVE
RBC, UA: NEGATIVE
Specific Gravity, UA: 1.015 (ref 1.005–1.030)
Urobilinogen, Ur: 0.2 mg/dL (ref 0.2–1.0)
pH, UA: 6 (ref 5.0–7.5)

## 2022-11-26 MED ORDER — TAMSULOSIN HCL 0.4 MG PO CAPS: 0.4000 mg | ORAL_CAPSULE | Freq: Two times a day (BID) | ORAL | 3 refills | Status: DC

## 2022-11-26 NOTE — Patient Instructions (Addendum)
Continue with metamucil nightly  Good water intake, aim for 64oz per day Diet high in fruits, veggies, whole grains Consider trying to minimize opiates as much as possible (discuss with PCP) We will schedule colonoscopy   Follow up 1 year  It was a pleasure to see you today. I want to create trusting relationships with patients and provide genuine, compassionate, and quality care. I truly value your feedback! please be on the lookout for a survey regarding your visit with me today. I appreciate your input about our visit and your time in completing this!    Amaree Leeper L. Jeanmarie Hubert, MSN, APRN, AGNP-C Adult-Gerontology Nurse Practitioner [facility] G[facility]ology at [facility] [place]

## 2022-11-26 NOTE — Progress Notes (Addendum)
Referring Provider: Babs Sciara, MD Primary Care Physician:  Babs Sciara, MD Primary GI Physician: Levon Hedger   Chief Complaint  Patient presents with   Constipation    Followup on SM obstruction and constipation. States he is drinking more water with metamucil and doing better. Would like to schedule colonoscopy.    HPI:   [name] is a 82 y.o. male with past medical history of arthritis, skin cancer, carpal tunnel syndrome, chronic back pain, DVT, GERD, glaucoma, HTN, HLD   Patient presenting today for follow up of constipation and recent SBO   Last seen May 2024, at that time, doing okay, having a BM daily, taking metamucil nightly. GERD well controlled on protonix 40mg  daily.   Recommended to continue with metamuciil, protonix 40mg  daily, pt to consider repeat TCS and let me know how he wishes to proceed.  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 Had resolution of symptoms with NG tube as above. No previous SBOs. He notes he has been doing okay since SBO. He does take metamucil and reports minimal water intake, PCP told him he needed to increase water intake especially with his metamucil. He is having a BM every morning. No rectal bleeding or melena. Appetite is good. He has gained some weight. Does well on protonix 40mg  daily. No breakthrough symptoms  He states that once he talked to his wife and PCP, he feels like he should have a repeat colonoscopy. He states PCP wants to decrease his opiates especially in light of SBO, states aleve helped him previously but was advised not to take this anymore. He does have a 4oz drink of jack daniels nightly to help him relax and sleep. He is  concerned if he does not have his pain medications he will end up consuming more and more alcohol to help manage his pain. He is taking oxycodone 2-4 x/day, he tries to avoid the 4th dose if possible. He is still concerned about an episode where he stopped breathing during a back surgery in 2022.    Last Colonoscopy: 2018, 5 small TAs.  Last EGD 2019  - Normal proximal esophagus. - Longitudinal lines and circumferential rings in mid and distal esophagus. Biopsied. - Z-line irregular, 38 cm from the incisors. - 2 cm hiatal hernia. - Multiple gastric polyps. Biopsied. - Normal cardia, antrum and pylorus. - Normal duodenal bulb and second portion of the duodenum.     Past Medical History:  Diagnosis Date   Arthritis    Cancer (HCC)    skin   Carpal tunnel syndrome    Cataract    left eye   Chronic back pain    multiple back surgeries   Complication of anesthesia    pt states woke up during hemorroid surgery   DVT (deep venous thrombosis) (HCC) 03/2007   left leg    Dysrhythmia    GERD (gastroesophageal reflux disease)    takes Protonix daily   Glaucoma    History of colon polyps    History of hyperglycemia    History of MRSA infection 1992   HTN (hypertension)    takes  Ramipril daily  Hyperlipidemia    takes Pravastatin 3 times a week   Nocturia    Paroxysmal atrial fibrillation (HCC)    takes Pradaxa daily, Chads2vasc score 2(07/11/14)   Pneumonia    many yrs ago   Pulmonary embolism (HCC) 2008    Past Surgical History:  Procedure Laterality Date   APPLICATION OF ROBOTIC ASSISTANCE FOR SPINAL PROCEDURE  05/06/2021   Procedure: APPLICATION OF ROBOTIC ASSISTANCE FOR SPINAL PROCEDURE;  Surgeon: Barnett Abu, MD;  Location: [facility];  Service: Neurosurgery;;   BACK SURGERY     BIOPSY  04/01/2018   Procedure: BIOPSY;  Surgeon: Malissa Hippo, MD;  Location: [facility];  Service: Endoscopy;;  gastric   CARDIAC CATHETERIZATION  05/18/2006   CARPAL TUNNEL RELEASE  Right    cataract surgery Right    cervical and lumbar surgery     COLONOSCOPY  04/24/2011   Procedure: COLONOSCOPY;  Surgeon: Malissa Hippo, MD;  Location: [facility];  Service: Endoscopy;  Laterality: N/A;  1200   COLONOSCOPY N/A 08/13/2016   Procedure: COLONOSCOPY;  Surgeon: Malissa Hippo, MD;  Location: [facility];  Service: Endoscopy;  Laterality: N/A;  1200   ESOPHAGOGASTRODUODENOSCOPY  03/10/2012   Procedure: ESOPHAGOGASTRODUODENOSCOPY (EGD);  Surgeon: Malissa Hippo, MD;  [facility]: [facility];  Service: Endoscopy;  Laterality: N/A;  325   ESOPHAGOGASTRODUODENOSCOPY (EGD) WITH PROPOFOL N/A 04/01/2018   Procedure: ESOPHAGOGASTRODUODENOSCOPY (EGD) WITH PROPOFOL;  Surgeon: Malissa Hippo, MD;  [facility]: [facility];  Service: Endoscopy;  Laterality: N/A;   EYE SURGERY     HARDWARE REMOVAL N/A 04/24/2014   Procedure: REMOVAL OF SACRAL INSTRUMENTATION WITH METREX;  Surgeon: Barnett Abu, MD;  Location: [facility];  Service: Neurosurgery;  Laterality: N/A;   HEMORRHOIDECTOMY WITH HEMORRHOID BANDING     JOINT REPLACEMENT     KNEE ARTHROSCOPY Right    KNEE ARTHROSCOPY WITH MEDIAL MENISECTOMY Right 08/03/2018   Procedure: RIGHT KNEE ARTHROSCOPY WITH PARTIAL MEDIAL MENISCECTOMY;  Surgeon: Tarry Kos, MD;  Location: [facility];  Service: Orthopedics;  Laterality: Right;   KNEE SURGERY     left   LASIK     POLYPECTOMY  08/13/2016   Procedure: POLYPECTOMY;  Surgeon: Malissa Hippo, MD;  Location: [facility];  Service: Endoscopy;;  colon   PVI  01/24/2010   afib ablation   TRANSFORAMINAL LUMBAR INTERBODY FUSION (TLIF) WITH PEDICLE SCREW FIXATION 1 LEVEL N/A 05/06/2021   Procedure: Transforaminal Lumbar Interbody Fusion  - Thoracic twelve-Lumbar one fixation Thoracic nine to Lumbar one with methacrylate augmentation with mazor;  Surgeon: Barnett Abu, MD;  Location: [facility];  Service: Neurosurgery;  Laterality: N/A;    Current Outpatient Medications   Medication Sig Dispense Refill   amLODipine (NORVASC) 10 MG tablet TAKE 1 TABLET BY MOUTH EVERY DAY (Patient taking differently: Take 10 mg by mouth daily.) 90 tablet 1   ELIQUIS 5 MG TABS tablet TAKE 1 TABLET BY MOUTH TWICE A DAY. STOP PRADAXA (Patient taking differently: Take 5 mg by mouth 2 (two) times daily.) 60 tablet 5   indapamide (LOZOL) 1.25 MG tablet Take 1 tablet (1.25 mg total) by mouth daily. 30 tablet 5   latanoprost (XALATAN) 0.005 % ophthalmic solution Place 1 drop into both eyes at bedtime.     metoprolol succinate (TOPROL-XL) 25 MG 24 hr tablet Take 1 tablet (25 mg total) by mouth 2 (two) times daily. 180 tablet 2   Oxycodone HCl 10 MG TABS 1 taken 4  times daily as needed for pain caution drowsiness (Patient taking differently: Take 1 tablet by mouth 4 (four) times daily as needed (pain). caution drowsiness) 120 tablet 0   pantoprazole (PROTONIX) 40 MG tablet TAKE 1 TABLET BY MOUTH EVERY DAY BEFORE BREAKFAST (Patient taking differently: Take 40 mg by mouth daily.) 90 tablet 1   pravastatin (PRAVACHOL) 80 MG tablet TAKE 1 TABLET BY MOUTH EVERYDAY AT BEDTIME (Patient taking differently: Take 80 mg by mouth daily.) 90 tablet 1   psyllium (METAMUCIL SMOOTH TEXTURE) 58.6 % powder Take 1 packet by mouth at bedtime.     ramipril (ALTACE) 10 MG capsule Take 1 capsule (10 mg total) by mouth 2 (two) times daily. 180 capsule 1   tamsulosin (FLOMAX) 0.4 MG CAPS capsule Take 1 capsule (0.4 mg total) by mouth daily. 90 capsule 1   torsemide (DEMADEX) 10 MG tablet Take 1 tablet (10 mg total) by mouth daily. 30 tablet 5   XDEMVY 0.25 % SOLN Apply to eye.     No current facility-administered medications for this visit.    Allergies as of 11/26/2022 - Review Complete 11/26/2022  Allergen Reaction Noted   Sectral [acebutolol hcl] Other (See Comments) 09/14/2012   Sulfonamide derivatives Other (See Comments)    Zocor [simvastatin] Other (See Comments) 09/14/2012    Family History  Problem  Relation Age of Onset   Hypertension Mother    Transient ischemic attack Mother    Heart disease Father    Diabetes Brother    Healthy Daughter    Colon cancer Sister    Hypertension Other    Heart disease Other     Social History   Socioeconomic History   Marital status: Married    Spouse name: Not on file   Number of children: Not on file   Years of education: Not on file   Highest education level: Not on file  Occupational History   Occupation: retired  Tobacco Use   Smoking status: Former    Current packs/day: 0.00    Average packs/day: 0.3 packs/day for 3.0 years (0.8 ttl pk-yrs)    Types: Cigarettes    Start date: 03/30/1966    Quit date: 03/30/1969    Years since quitting: 53.6    Passive exposure: Past   Smokeless tobacco: Never  Vaping Use   Vaping status: Never Used  Substance and Sexual Activity   Alcohol use: Yes    Alcohol/week: 14.0 standard drinks of alcohol    Types: 7 Glasses of wine, 7 Shots of liquor per week    Comment: Jack nightly   Drug use: No   Sexual activity: Yes  Other Topics Concern   Not on file  Social History Narrative   Not on file   Social Determinants of Health   Financial Resource Strain: Low Risk  (11/12/2020)   Overall Financial Resource Strain (CARDIA)    Difficulty of Paying Living Expenses: Not hard at all  Food Insecurity: No Food Insecurity (10/26/2022)   Hunger Vital Sign    Worried About Running Out of Food in the Last Year: Never true    Ran Out of Food in the Last Year: Never true  Transportation Needs: No Transportation Needs (10/26/2022)   PRAPARE - Administrator, Civil Service (Medical): No    Lack of Transportation (Non-Medical): No  Physical Activity: Sufficiently Active (11/25/2021)   Exercise Vital Sign    Days of Exercise per Week: 5 days    Minutes of Exercise per  Session: 30 min  Stress: No Stress Concern Present (11/25/2021)   [employer] of Occupational Health - Occupational Stress  Questionnaire    Feeling of Stress : Not at all  Social Connections: Socially Integrated (11/25/2021)   Social Connection and Isolation Panel [NHANES]    Frequency of Communication with Friends and Family: More than three times a week    Frequency of Social Gatherings with Friends and Family: More than three times a week    Attends Religious Services: More than 4 times per year    Active Member of [employer] or Organizations: Yes    Attends Engineer, structural: More than 4 times per year    Marital Status: Married   Review of systems General: negative for malaise, night sweats, fever, chills, weight loss Neck: Negative for lumps, goiter, pain and significant neck swelling Resp: Negative for cough, wheezing, dyspnea at rest CV: Negative for chest pain, leg swelling, palpitations, orthopnea GI: denies melena, hematochezia, nausea, vomiting, diarrhea, constipation, dysphagia, odyonophagia, early satiety or unintentional weight loss.  MSK: Negative for joint pain or swelling, back pain, and muscle pain. Derm: Negative for itching or rash Psych: Denies depression, anxiety, memory loss, confusion. No homicidal or suicidal ideation.  Heme: Negative for prolonged bleeding, bruising easily, and swollen nodes. Endocrine: Negative for cold or heat intolerance, polyuria, polydipsia and goiter. Neuro: negative for tremor, gait imbalance, syncope and seizures. The remainder of the review of systems is noncontributory.  Physical Exam: BP 109/67 (BP Location: Left Arm, Patient Position: Sitting, Cuff Size: Large)   Pulse 74   Temp 97.9 F (36.6 C) (Oral)   Ht 5\' 6"  (1.676 m)   Wt 203 lb 9.6 oz (92.4 kg)   BMI 32.86 kg/m  General:   Alert and oriented. No distress noted. Pleasant and cooperative.  Head:  Normocephalic and atraumatic. Eyes:  Conjuctiva clear without scleral icterus. Mouth:  Oral mucosa pink and moist. Good dentition. No lesions. Heart: Normal rate and rhythm, s1 and s2 heart  sounds present.  Lungs: Clear lung sounds in all lobes. Respirations equal and unlabored. Abdomen:  +BS, soft, non-tender and non-distended. No rebound or guarding. No HSM or masses noted. Derm: No palmar erythema or jaundice Msk:  Symmetrical without gross deformities. Normal posture. Extremities:  Without edema. Neurologic:  Alert and  oriented x4 Psych:  Alert and cooperative. Normal mood and affect.  Invalid input(s): "6 MONTHS"   ASSESSMENT: DANTE ROUDEBUSH is a 82 y.o. male presenting today for follow up of constipation, recent admission for SBO  Constipation/SBO: constipation well controlled with metamucil at bedtime, he has increased his water intake as he was previously not drinking much water with his metamucil. Recent SBO with admission to [facility] In June, NG tube for decompression resolved his symptoms. No history of SBO in the past and no abdominal surgeries. SBO could possibly be secondary to slow transit from opiate use though etiology unclear. Last colonoscopy was in 2018 with 5 TAs, we have previously discussed risks/benefits of proceeding with repeat colonoscopy at his age, I have also discussed this with his PCP. Patient has decided he would like to pursue updating a colonoscopy at this time and this may also help to further evaluate the etiology of his recent SBO. Will consider CT enterography if Colonoscopy is unremarkable for causes of bowel obstruction.   Would recommend continued metamucil, minimizing opiates as much as possible which his PCP is working towards with him, making sure water intake is good and diet  is high in fruits, veggies, whole grains.    PLAN:  Continue with metamucil nightly  2. Good water intake, aim for 64oz per day 3. Diet high in fruits, veggies, whole grains 4. Consider trying to minimize opiates as much as possible (discuss with PCP) 5. Schedule colonoscopy -ASA III, on eliquis-hold x2 days  6. Consider CT enterography for further evaluation of  SBO if Colonoscopy unremarkable  All questions were answered, patient verbalized understanding and is in agreement with plan as outlined above.   Follow Up: 1 year  Jamone Garrido L. Jeanmarie Hubert, MSN, APRN, AGNP-C Adult-Gerontology Nurse Practitioner [facility]  I have reviewed the note and agree with the APP's assessment as described in this progress note.  Patient presenting very proximal small bowel obstruction that resolved with decompression with NGT. Currently asymptomatic. Will proceed with colonoscopy and may perform CT enterography if unremarkable -if stricture is found, will need to proceed with push enteroscopy. I wonder if NSAIDs may have contributed to his current presentation. He should abstain from taking this.  Katrinka Blazing, MD Gastroenterology and Hepatology [facility]

## 2022-11-26 NOTE — H&P (View-Only) (Signed)
Referring Provider: Babs Sciara, MD Primary Care Physician:  Babs Sciara, MD Primary GI Physician: Levon Hedger   Chief Complaint  Patient presents with   Constipation    Followup on SM obstruction and constipation. States he is drinking more water with metamucil and doing better. Would like to schedule colonoscopy.    HPI:   [name] is a 82 y.o. male with past medical history of arthritis, skin cancer, carpal tunnel syndrome, chronic back pain, DVT, GERD, glaucoma, HTN, HLD   Patient presenting today for follow up of constipation and recent SBO   Last seen May 2024, at that time, doing okay, having a BM daily, taking metamucil nightly. GERD well controlled on protonix 40mg  daily.   Recommended to continue with metamuciil, protonix 40mg  daily, pt to consider repeat TCS and let me know how he wishes to proceed.  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 Had resolution of symptoms with NG tube as above. No previous SBOs. He notes he has been doing okay since SBO. He does take metamucil and reports minimal water intake, PCP told him he needed to increase water intake especially with his metamucil. He is having a BM every morning. No rectal bleeding or melena. Appetite is good. He has gained some weight. Does well on protonix 40mg  daily. No breakthrough symptoms  He states that once he talked to his wife and PCP, he feels like he should have a repeat colonoscopy. He states PCP wants to decrease his opiates especially in light of SBO, states aleve helped him previously but was advised not to take this anymore. He does have a 4oz drink of jack daniels nightly to help him relax and sleep. He is  concerned if he does not have his pain medications he will end up consuming more and more alcohol to help manage his pain. He is taking oxycodone 2-4 x/day, he tries to avoid the 4th dose if possible. He is still concerned about an episode where he stopped breathing during a back surgery in 2022.    Last Colonoscopy: 2018, 5 small TAs.  Last EGD 2019  - Normal proximal esophagus. - Longitudinal lines and circumferential rings in mid and distal esophagus. Biopsied. - Z-line irregular, 38 cm from the incisors. - 2 cm hiatal hernia. - Multiple gastric polyps. Biopsied. - Normal cardia, antrum and pylorus. - Normal duodenal bulb and second portion of the duodenum.     Past Medical History:  Diagnosis Date   Arthritis    Cancer (HCC)    skin   Carpal tunnel syndrome    Cataract    left eye   Chronic back pain    multiple back surgeries   Complication of anesthesia    pt states woke up during hemorroid surgery   DVT (deep venous thrombosis) (HCC) 03/2007   left leg    Dysrhythmia    GERD (gastroesophageal reflux disease)    takes Protonix daily   Glaucoma    History of colon polyps    History of hyperglycemia    History of MRSA infection 1992   HTN (hypertension)    takes  Ramipril daily  Hyperlipidemia    takes Pravastatin 3 times a week   Nocturia    Paroxysmal atrial fibrillation (HCC)    takes Pradaxa daily, Chads2vasc score 2(07/11/14)   Pneumonia    many yrs ago   Pulmonary embolism (HCC) 2008    Past Surgical History:  Procedure Laterality Date   APPLICATION OF ROBOTIC ASSISTANCE FOR SPINAL PROCEDURE  05/06/2021   Procedure: APPLICATION OF ROBOTIC ASSISTANCE FOR SPINAL PROCEDURE;  Surgeon: Barnett Abu, MD;  Location: [facility];  Service: Neurosurgery;;   BACK SURGERY     BIOPSY  04/01/2018   Procedure: BIOPSY;  Surgeon: Malissa Hippo, MD;  Location: [facility];  Service: Endoscopy;;  gastric   CARDIAC CATHETERIZATION  05/18/2006   CARPAL TUNNEL RELEASE  Right    cataract surgery Right    cervical and lumbar surgery     COLONOSCOPY  04/24/2011   Procedure: COLONOSCOPY;  Surgeon: Malissa Hippo, MD;  Location: [facility];  Service: Endoscopy;  Laterality: N/A;  1200   COLONOSCOPY N/A 08/13/2016   Procedure: COLONOSCOPY;  Surgeon: Malissa Hippo, MD;  Location: [facility];  Service: Endoscopy;  Laterality: N/A;  1200   ESOPHAGOGASTRODUODENOSCOPY  03/10/2012   Procedure: ESOPHAGOGASTRODUODENOSCOPY (EGD);  Surgeon: Malissa Hippo, MD;  [facility]: [facility];  Service: Endoscopy;  Laterality: N/A;  325   ESOPHAGOGASTRODUODENOSCOPY (EGD) WITH PROPOFOL N/A 04/01/2018   Procedure: ESOPHAGOGASTRODUODENOSCOPY (EGD) WITH PROPOFOL;  Surgeon: Malissa Hippo, MD;  [facility]: [facility];  Service: Endoscopy;  Laterality: N/A;   EYE SURGERY     HARDWARE REMOVAL N/A 04/24/2014   Procedure: REMOVAL OF SACRAL INSTRUMENTATION WITH METREX;  Surgeon: Barnett Abu, MD;  Location: [facility];  Service: Neurosurgery;  Laterality: N/A;   HEMORRHOIDECTOMY WITH HEMORRHOID BANDING     JOINT REPLACEMENT     KNEE ARTHROSCOPY Right    KNEE ARTHROSCOPY WITH MEDIAL MENISECTOMY Right 08/03/2018   Procedure: RIGHT KNEE ARTHROSCOPY WITH PARTIAL MEDIAL MENISCECTOMY;  Surgeon: Tarry Kos, MD;  Location: [facility];  Service: Orthopedics;  Laterality: Right;   KNEE SURGERY     left   LASIK     POLYPECTOMY  08/13/2016   Procedure: POLYPECTOMY;  Surgeon: Malissa Hippo, MD;  Location: [facility];  Service: Endoscopy;;  colon   PVI  01/24/2010   afib ablation   TRANSFORAMINAL LUMBAR INTERBODY FUSION (TLIF) WITH PEDICLE SCREW FIXATION 1 LEVEL N/A 05/06/2021   Procedure: Transforaminal Lumbar Interbody Fusion  - Thoracic twelve-Lumbar one fixation Thoracic nine to Lumbar one with methacrylate augmentation with mazor;  Surgeon: Barnett Abu, MD;  Location: [facility];  Service: Neurosurgery;  Laterality: N/A;    Current Outpatient Medications   Medication Sig Dispense Refill   amLODipine (NORVASC) 10 MG tablet TAKE 1 TABLET BY MOUTH EVERY DAY (Patient taking differently: Take 10 mg by mouth daily.) 90 tablet 1   ELIQUIS 5 MG TABS tablet TAKE 1 TABLET BY MOUTH TWICE A DAY. STOP PRADAXA (Patient taking differently: Take 5 mg by mouth 2 (two) times daily.) 60 tablet 5   indapamide (LOZOL) 1.25 MG tablet Take 1 tablet (1.25 mg total) by mouth daily. 30 tablet 5   latanoprost (XALATAN) 0.005 % ophthalmic solution Place 1 drop into both eyes at bedtime.     metoprolol succinate (TOPROL-XL) 25 MG 24 hr tablet Take 1 tablet (25 mg total) by mouth 2 (two) times daily. 180 tablet 2   Oxycodone HCl 10 MG TABS 1 taken 4  times daily as needed for pain caution drowsiness (Patient taking differently: Take 1 tablet by mouth 4 (four) times daily as needed (pain). caution drowsiness) 120 tablet 0   pantoprazole (PROTONIX) 40 MG tablet TAKE 1 TABLET BY MOUTH EVERY DAY BEFORE BREAKFAST (Patient taking differently: Take 40 mg by mouth daily.) 90 tablet 1   pravastatin (PRAVACHOL) 80 MG tablet TAKE 1 TABLET BY MOUTH EVERYDAY AT BEDTIME (Patient taking differently: Take 80 mg by mouth daily.) 90 tablet 1   psyllium (METAMUCIL SMOOTH TEXTURE) 58.6 % powder Take 1 packet by mouth at bedtime.     ramipril (ALTACE) 10 MG capsule Take 1 capsule (10 mg total) by mouth 2 (two) times daily. 180 capsule 1   tamsulosin (FLOMAX) 0.4 MG CAPS capsule Take 1 capsule (0.4 mg total) by mouth daily. 90 capsule 1   torsemide (DEMADEX) 10 MG tablet Take 1 tablet (10 mg total) by mouth daily. 30 tablet 5   XDEMVY 0.25 % SOLN Apply to eye.     No current facility-administered medications for this visit.    Allergies as of 11/26/2022 - Review Complete 11/26/2022  Allergen Reaction Noted   Sectral [acebutolol hcl] Other (See Comments) 09/14/2012   Sulfonamide derivatives Other (See Comments)    Zocor [simvastatin] Other (See Comments) 09/14/2012    Family History  Problem  Relation Age of Onset   Hypertension Mother    Transient ischemic attack Mother    Heart disease Father    Diabetes Brother    Healthy Daughter    Colon cancer Sister    Hypertension Other    Heart disease Other     Social History   Socioeconomic History   Marital status: Married    Spouse name: Not on file   Number of children: Not on file   Years of education: Not on file   Highest education level: Not on file  Occupational History   Occupation: retired  Tobacco Use   Smoking status: Former    Current packs/day: 0.00    Average packs/day: 0.3 packs/day for 3.0 years (0.8 ttl pk-yrs)    Types: Cigarettes    Start date: 03/30/1966    Quit date: 03/30/1969    Years since quitting: 53.6    Passive exposure: Past   Smokeless tobacco: Never  Vaping Use   Vaping status: Never Used  Substance and Sexual Activity   Alcohol use: Yes    Alcohol/week: 14.0 standard drinks of alcohol    Types: 7 Glasses of wine, 7 Shots of liquor per week    Comment: Jack nightly   Drug use: No   Sexual activity: Yes  Other Topics Concern   Not on file  Social History Narrative   Not on file   Social Determinants of Health   Financial Resource Strain: Low Risk  (11/12/2020)   Overall Financial Resource Strain (CARDIA)    Difficulty of Paying Living Expenses: Not hard at all  Food Insecurity: No Food Insecurity (10/26/2022)   Hunger Vital Sign    Worried About Running Out of Food in the Last Year: Never true    Ran Out of Food in the Last Year: Never true  Transportation Needs: No Transportation Needs (10/26/2022)   PRAPARE - Administrator, Civil Service (Medical): No    Lack of Transportation (Non-Medical): No  Physical Activity: Sufficiently Active (11/25/2021)   Exercise Vital Sign    Days of Exercise per Week: 5 days    Minutes of Exercise per  Session: 30 min  Stress: No Stress Concern Present (11/25/2021)   [employer] of Occupational Health - Occupational Stress  Questionnaire    Feeling of Stress : Not at all  Social Connections: Socially Integrated (11/25/2021)   Social Connection and Isolation Panel [NHANES]    Frequency of Communication with Friends and Family: More than three times a week    Frequency of Social Gatherings with Friends and Family: More than three times a week    Attends Religious Services: More than 4 times per year    Active Member of [employer] or Organizations: Yes    Attends Engineer, structural: More than 4 times per year    Marital Status: Married   Review of systems General: negative for malaise, night sweats, fever, chills, weight loss Neck: Negative for lumps, goiter, pain and significant neck swelling Resp: Negative for cough, wheezing, dyspnea at rest CV: Negative for chest pain, leg swelling, palpitations, orthopnea GI: denies melena, hematochezia, nausea, vomiting, diarrhea, constipation, dysphagia, odyonophagia, early satiety or unintentional weight loss.  MSK: Negative for joint pain or swelling, back pain, and muscle pain. Derm: Negative for itching or rash Psych: Denies depression, anxiety, memory loss, confusion. No homicidal or suicidal ideation.  Heme: Negative for prolonged bleeding, bruising easily, and swollen nodes. Endocrine: Negative for cold or heat intolerance, polyuria, polydipsia and goiter. Neuro: negative for tremor, gait imbalance, syncope and seizures. The remainder of the review of systems is noncontributory.  Physical Exam: BP 109/67 (BP Location: Left Arm, Patient Position: Sitting, Cuff Size: Large)   Pulse 74   Temp 97.9 F (36.6 C) (Oral)   Ht 5\' 6"  (1.676 m)   Wt 203 lb 9.6 oz (92.4 kg)   BMI 32.86 kg/m  General:   Alert and oriented. No distress noted. Pleasant and cooperative.  Head:  Normocephalic and atraumatic. Eyes:  Conjuctiva clear without scleral icterus. Mouth:  Oral mucosa pink and moist. Good dentition. No lesions. Heart: Normal rate and rhythm, s1 and s2 heart  sounds present.  Lungs: Clear lung sounds in all lobes. Respirations equal and unlabored. Abdomen:  +BS, soft, non-tender and non-distended. No rebound or guarding. No HSM or masses noted. Derm: No palmar erythema or jaundice Msk:  Symmetrical without gross deformities. Normal posture. Extremities:  Without edema. Neurologic:  Alert and  oriented x4 Psych:  Alert and cooperative. Normal mood and affect.  Invalid input(s): "6 MONTHS"   ASSESSMENT: [name] is a 82 y.o. male presenting today for follow up of constipation, recent admission for SBO  Constipation/SBO: constipation well controlled with metamucil at bedtime, he has increased his water intake as he was previously not drinking much water with his metamucil. Recent SBO with admission to [facility] In June, NG tube for decompression resolved his symptoms. No history of SBO in the past and no abdominal surgeries. SBO could possibly be secondary to slow transit from opiate use though etiology unclear. Last colonoscopy was in 2018 with 5 TAs, we have previously discussed risks/benefits of proceeding with repeat colonoscopy at his age, I have also discussed this with his PCP. Patient has decided he would like to pursue updating a colonoscopy at this time and this may also help to further evaluate the etiology of his recent SBO. Will consider CT enterography if Colonoscopy is unremarkable for causes of bowel obstruction.   Would recommend continued metamucil, minimizing opiates as much as possible which his PCP is working towards with him, making sure water intake is good and diet  is high in fruits, veggies, whole grains.    PLAN:  Continue with metamucil nightly  2. Good water intake, aim for 64oz per day 3. Diet high in fruits, veggies, whole grains 4. Consider trying to minimize opiates as much as possible (discuss with PCP) 5. Schedule colonoscopy -ASA III, on eliquis-hold x2 days  6. Consider CT enterography for further evaluation of  SBO if Colonoscopy unremarkable  All questions were answered, patient verbalized understanding and is in agreement with plan as outlined above.   Follow Up: 1 year   L. Jeanmarie Hubert, MSN, APRN, AGNP-C Adult-Gerontology Nurse Practitioner [facility]  I have reviewed the note and agree with the APP's assessment as described in this progress note.  Patient presenting very proximal small bowel obstruction that resolved with decompression with NGT. Currently asymptomatic. Will proceed with colonoscopy and may perform CT enterography if unremarkable -if stricture is found, will need to proceed with push enteroscopy. I wonder if NSAIDs may have contributed to his current presentation. He should abstain from taking this.  Katrinka Blazing, MD Gastroenterology and Hepatology [facility]

## 2022-11-26 NOTE — Progress Notes (Signed)
Subjective: 1. Benign prostatic hyperplasia with urinary frequency   2. Nocturia   3. Urgency of urination   4. Urge incontinence      11/26/22: [name] returns in f/u.  He is on tamsulosin bid.  His IPSS is 16 with nocturia x 5 but he takes his torsemide in the evening.  His UA is clear.  He still has some occasional UUI.   05/21/22: [name] returns today in f/u.  He didn't benefit from the Myrbetriq 50mg  and stopped that.  His diuretic has been cut back and he takes it at night which is better for him.  He is content with the tamsulosin but would like to try 0.8mg .  His PVR is 19ml.  His IPSS is 15 with nocturia x 4.   04/16/22: [name] returns today in f/u. He has BPH with OAB and UUI.  He responded to [facility] but it was too expensive.  He is on tamsulosin which has helped but he still has UUI.  HIs UA is clear and his PVR is 91ml.   6/30/22[name] returns today in f/u to assess his response to Myrbetriq.   He thinks he is doing better with reduced frequency and urgency.  He has reduced UUI as well.   His PVR is stable at 66ml.   He has had some constipation and mild headache just today.  He had a CT last week and was found to have diverticulitis.  He is on Cipro and Metronidazole.  He has had side effects with the antibiotics.    [name] returns today in f/u.  He was last seen in 2018 for BPH with BOO with urethral bleeding that was associated with urethral distention.  He had been on finasteride but has been off for a while.  He currently reports urgency and has UUI.  He has nocturia x 2.  His last PSA was stable at 2.0 in 1/19.  Dr. Gerda Diss ordered one that was drawn today.  His PVR is 56ml.   He has had some recent constipation that is associated with chronic opioid for low back pain.  He had a CT stone study in 5/21 that showed no stones.    IPSS     Row Name 11/26/22 1500         International Prostate Symptom Score   How often have you had the sensation of not emptying your bladder?  About half the time     How often have you had to urinate less than every two hours? Less than half the time     How often have you found you stopped and started again several times when you urinated? Less than 1 in 5 times     How often have you found it difficult to postpone urination? More than half the time     How often have you had a weak urinary stream? Less than 1 in 5 times     How often have you had to strain to start urination? Not at All     How many times did you typically get up at night to urinate? 5 Times     Total IPSS Score 16                    ROS:  Review of Systems  Constitutional:        He has gained about 30lb  Respiratory:  Positive for shortness of breath.   Cardiovascular:  Positive for leg swelling.  Musculoskeletal:  Positive for back pain and joint pain.  Neurological:  Positive for weakness and headaches.  Endo/Heme/Allergies:  Bruises/bleeds easily.  All other systems reviewed and are negative.   Allergies  Allergen Reactions   Sectral [Acebutolol Hcl] Other (See Comments)    Unknown reaction   Sulfonamide Derivatives Other (See Comments)    Runs blood pressure up   Zocor [Simvastatin] Other (See Comments)    Joint pain    Past Medical History:  Diagnosis Date   Arthritis    Cancer (HCC)    skin   Carpal tunnel syndrome    Cataract    left eye   Chronic back pain    multiple back surgeries   Complication of anesthesia    pt states woke up during hemorroid surgery   DVT (deep venous thrombosis) (HCC) 03/2007   left leg    Dysrhythmia    GERD (gastroesophageal reflux disease)    takes Protonix daily   Glaucoma    History of colon polyps    History of hyperglycemia    History of MRSA infection 1992   HTN (hypertension)    takes  Ramipril daily   Hyperlipidemia    takes Pravastatin 3 times a week   Nocturia    Paroxysmal atrial fibrillation (HCC)    takes Pradaxa daily, Chads2vasc score 2(07/11/14)   Pneumonia     many yrs ago   Pulmonary embolism (HCC) 2008    Past Surgical History:  Procedure Laterality Date   APPLICATION OF ROBOTIC ASSISTANCE FOR SPINAL PROCEDURE  05/06/2021   Procedure: APPLICATION OF ROBOTIC ASSISTANCE FOR SPINAL PROCEDURE;  Surgeon: Barnett Abu, MD;  Location: [facility];  Service: Neurosurgery;;   BACK SURGERY     BIOPSY  04/01/2018   Procedure: BIOPSY;  Surgeon: Malissa Hippo, MD;  [facility]: [facility];  Service: Endoscopy;;  gastric   CARDIAC CATHETERIZATION  05/18/2006   CARPAL TUNNEL RELEASE Right    cataract surgery Right    cervical and lumbar surgery     COLONOSCOPY  04/24/2011   Procedure: COLONOSCOPY;  Surgeon: Malissa Hippo, MD;  [facility]: [facility];  Service: Endoscopy;  Laterality: N/A;  1200   COLONOSCOPY N/A 08/13/2016   Procedure: COLONOSCOPY;  Surgeon: Malissa Hippo, MD;  [facility]: [facility];  Service: Endoscopy;  Laterality: N/A;  1200   ESOPHAGOGASTRODUODENOSCOPY  03/10/2012   Procedure: ESOPHAGOGASTRODUODENOSCOPY (EGD);  Surgeon: Malissa Hippo, MD;  [facility];  Service: Endoscopy;  Laterality: N/A;  325   ESOPHAGOGASTRODUODENOSCOPY (EGD) WITH PROPOFOL N/A 04/01/2018   Procedure: ESOPHAGOGASTRODUODENOSCOPY (EGD) WITH PROPOFOL;  Surgeon: Malissa Hippo, MD;  [facility];  Service: Endoscopy;  Laterality: N/A;   EYE SURGERY     HARDWARE REMOVAL N/A 04/24/2014   Procedure: REMOVAL OF SACRAL INSTRUMENTATION WITH METREX;  Surgeon: Barnett Abu, MD;  Location: [facility];  Service: Neurosurgery;  Laterality: N/A;   HEMORRHOIDECTOMY WITH HEMORRHOID BANDING     JOINT REPLACEMENT     KNEE ARTHROSCOPY Right    KNEE ARTHROSCOPY WITH MEDIAL MENISECTOMY Right 08/03/2018   Procedure: RIGHT KNEE ARTHROSCOPY WITH PARTIAL MEDIAL MENISCECTOMY;  Surgeon: Tarry Kos, MD;  Location: [facility];  Service: Orthopedics;  Laterality: Right;   KNEE SURGERY     left   LASIK     POLYPECTOMY  08/13/2016    Procedure: POLYPECTOMY;  Surgeon: Malissa Hippo, MD;  [facility];  Service: Endoscopy;;  colon   PVI  01/24/2010   afib ablation   TRANSFORAMINAL LUMBAR INTERBODY FUSION (TLIF) WITH PEDICLE SCREW FIXATION 1 LEVEL N/A 05/06/2021   Procedure: Transforaminal Lumbar Interbody Fusion  - Thoracic twelve-Lumbar one fixation Thoracic nine to Lumbar one with methacrylate augmentation with mazor;  Surgeon: Barnett Abu, MD;  Location: [facility];  Service: Neurosurgery;  Laterality: N/A;    Social History   Socioeconomic History   Marital status: Married    Spouse name: Not on file   Number of children: Not on file   Years of education: Not on file   Highest education level: Not on file  Occupational History   Occupation: retired  Tobacco Use   Smoking status: Former    Current packs/day: 0.00    Average packs/day: 0.3 packs/day for 3.0 years (0.8 ttl pk-yrs)    Types: Cigarettes    Start date: 03/30/1966    Quit date: 03/30/1969    Years since quitting: 53.6    Passive exposure: Past   Smokeless tobacco: Never  Vaping Use   Vaping status: Never Used  Substance and Sexual Activity   Alcohol use: Yes    Alcohol/week: 14.0 standard drinks of alcohol    Types: 7 Glasses of wine, 7 Shots of liquor per week    Comment: Jack nightly   Drug use: No   Sexual activity: Yes  Other Topics Concern   Not on file  Social History Narrative   Not on file   Social Determinants of Health   Financial Resource Strain: Low Risk  (11/12/2020)   Overall Financial Resource Strain (CARDIA)    Difficulty of Paying Living Expenses: Not hard at all  Food Insecurity: No Food Insecurity (10/26/2022)   Hunger Vital Sign    Worried About Running Out of Food in the Last Year: Never true    Ran Out of Food in the Last Year: Never true  Transportation Needs: No Transportation Needs (10/26/2022)   PRAPARE - Administrator, Civil Service (Medical): No    Lack of Transportation (Non-Medical):  No  Physical Activity: Sufficiently Active (11/25/2021)   Exercise Vital Sign    Days of Exercise per Week: 5 days    Minutes of Exercise per Session: 30 min  Stress: No Stress Concern Present (11/25/2021)   [employer] of Occupational Health - Occupational Stress Questionnaire    Feeling of Stress : Not at all  Social Connections: Socially Integrated (11/25/2021)   Social Connection and Isolation Panel [NHANES]    Frequency of Communication with Friends and Family: More than three times a week    Frequency of Social Gatherings with Friends and Family: More than three times a week    Attends Religious Services: More than 4 times per year    Active Member of [employer] or Organizations: Yes    Attends Engineer, structural: More than 4 times per year    Marital Status: Married  Catering manager Violence: Not At Risk (10/26/2022)   Humiliation, Afraid, Rape, and Kick questionnaire    Fear of Current or Ex-Partner: No    Emotionally Abused: No    Physically Abused: No    Sexually Abused: No    Family History  Problem Relation Age of Onset   Hypertension Mother    Transient ischemic attack Mother    Heart disease Father    Diabetes Brother    Healthy Daughter    Colon cancer Sister    Hypertension Other    Heart disease Other  Anti-infectives: Anti-infectives (From admission, onward)    None       Current Outpatient Medications  Medication Sig Dispense Refill   amLODipine (NORVASC) 10 MG tablet TAKE 1 TABLET BY MOUTH EVERY DAY (Patient taking differently: Take 10 mg by mouth daily.) 90 tablet 1   ELIQUIS 5 MG TABS tablet TAKE 1 TABLET BY MOUTH TWICE A DAY. STOP PRADAXA (Patient taking differently: Take 5 mg by mouth 2 (two) times daily.) 60 tablet 5   indapamide (LOZOL) 1.25 MG tablet Take 1 tablet (1.25 mg total) by mouth daily. 30 tablet 5   latanoprost (XALATAN) 0.005 % ophthalmic solution Place 1 drop into both eyes at bedtime.     metoprolol succinate  (TOPROL-XL) 25 MG 24 hr tablet Take 1 tablet (25 mg total) by mouth 2 (two) times daily. 180 tablet 2   Oxycodone HCl 10 MG TABS 1 taken 4 times daily as needed for pain caution drowsiness (Patient taking differently: Take 1 tablet by mouth 4 (four) times daily as needed (pain). caution drowsiness) 120 tablet 0   pantoprazole (PROTONIX) 40 MG tablet TAKE 1 TABLET BY MOUTH EVERY DAY BEFORE BREAKFAST (Patient taking differently: Take 40 mg by mouth daily.) 90 tablet 1   pravastatin (PRAVACHOL) 80 MG tablet TAKE 1 TABLET BY MOUTH EVERYDAY AT BEDTIME (Patient taking differently: Take 80 mg by mouth daily.) 90 tablet 1   psyllium (METAMUCIL SMOOTH TEXTURE) 58.6 % powder Take 1 packet by mouth at bedtime.     ramipril (ALTACE) 10 MG capsule Take 1 capsule (10 mg total) by mouth 2 (two) times daily. 180 capsule 1   torsemide (DEMADEX) 10 MG tablet Take 1 tablet (10 mg total) by mouth daily. 30 tablet 5   XDEMVY 0.25 % SOLN Apply to eye.     tamsulosin (FLOMAX) 0.4 MG CAPS capsule Take 1 capsule (0.4 mg total) by mouth 2 (two) times daily. 180 capsule 3   No current facility-administered medications for this visit.     Objective: Vital signs in last 24 hours: BP (!) 144/88   Pulse 86   Intake/Output from previous day: No intake/output data recorded. Intake/Output this shift: @IOTHISSHIFT @   Physical Exam Vitals reviewed.  Constitutional:      Appearance: Normal appearance.  Neurological:     Mental Status: He is alert.     Lab Results:  Results for orders placed or performed in visit on 11/26/22 (from the past 24 hour(s))  Urinalysis, Routine w reflex microscopic     Status: None   Collection Time: 11/26/22  2:06 PM  Result Value Ref Range   Specific Gravity, UA 1.015 1.005 - 1.030   pH, UA 6.0 5.0 - 7.5   Color, UA Yellow Yellow   Appearance Ur Clear Clear   Leukocytes,UA Negative Negative   Protein,UA Negative Negative/Trace   Glucose, UA Negative Negative   Ketones, UA  Negative Negative   RBC, UA Negative Negative   Bilirubin, UA Negative Negative   Urobilinogen, Ur 0.2 0.2 - 1.0 mg/dL   Nitrite, UA Negative Negative   Microscopic Examination Comment    Narrative   Performed at:  [address] - [facility] [address] [place], [place]  [id number] Lab Director: Chinita Pester MT, Phone:  [phone]    UA is clear today.    BMET No results for input(s): "NA", "K", "CL", "CO2", "GLUCOSE", "BUN", "CREATININE", "CALCIUM" in the last 72 hours.  PT/INR No results for input(s): "LABPROT", "INR" in the last 72 hours. ABG  No results for input(s): "PHART", "HCO3" in the last 72 hours.  Invalid input(s): "PCO2", "PO2" UA is clear.  Studies/Results:   Assessment/Plan: OAB wet with a history of BPH with BOO but minimal obstructive symptoms on tamsulosin.  He didn't do well on the Myrbetriq.  He will continue tamsulosin 0.4mg  bid.  He will return in 1 year   Meds ordered this encounter  Medications   tamsulosin (FLOMAX) 0.4 MG CAPS capsule    Sig: Take 1 capsule (0.4 mg total) by mouth 2 (two) times daily.    Dispense:  180 capsule    Refill:  3     Orders Placed This Encounter  Procedures   Urinalysis, Routine w reflex microscopic     Return in about 1 year (around 11/26/2023) for with PVR.    CC: Dr. Lilyan Punt.      Bjorn Pippin 11/27/2022 [phone] ID: [name], male   DOB: 02/21/1941, 82 y.o.   MRN: [id number] Patient ID: [name], male   DOB: Oct 16, 1940, 82 y.o.   MRN: [id number]

## 2022-12-02 MED ORDER — PEG 3350-KCL-NA BICARB-NACL 420 G PO SOLR: 4000.0000 mL | Freq: Once | ORAL | 0 refills | Status: AC

## 2022-12-02 NOTE — Telephone Encounter (Signed)
Left message to return call 

## 2022-12-02 NOTE — Telephone Encounter (Signed)
Pt returned call. TCS scheduled for 12/22/22 at 11:15 am with Dr.Castaneda. Prep sent to pharmacy. Instructions mailed to patient. Will call with pre op appt

## 2022-12-02 NOTE — Addendum Note (Signed)
Addended by: Marlowe Shores on: 12/02/2022 09:30 AM   Modules accepted: Orders

## 2022-12-09 NOTE — Telephone Encounter (Signed)
Contacted pt with pre op appt.

## 2022-12-14 ENCOUNTER — Ambulatory Visit (INDEPENDENT_AMBULATORY_CARE_PROVIDER_SITE_OTHER): Payer: PPO | Admitting: Family Medicine

## 2022-12-14 VITALS — BP 123/73 | HR 75 | Temp 98.4°F | Ht 66.0 in | Wt 197.0 lb

## 2022-12-14 DIAGNOSIS — Z8719 Personal history of other diseases of the digestive system: Secondary | ICD-10-CM

## 2022-12-14 DIAGNOSIS — Z79891 Long term (current) use of opiate analgesic: Secondary | ICD-10-CM

## 2022-12-14 MED ORDER — OXYCODONE HCL 10 MG PO TABS: ORAL_TABLET | ORAL | 0 refills | Status: DC

## 2022-12-14 NOTE — Progress Notes (Signed)
Subjective:    Patient ID: [name], male    DOB: 1941-04-04, 82 y.o.   MRN: [id number]  HPI  4 week follow up for bowel obstruction and hypokalemia Patient relates his bowels are moving better now that he is utilizing Metamucil with water on a regular basis  This patient was seen today for chronic pain  The medication list was reviewed and updated.   Location of Pain for which the patient has been treated with regarding narcotics: Cervical low back  Onset of this pain: Present for years   -Compliance with medication: Good compliance  - Number patient states they take daily: 3 or 4 daily  -Reason for ongoing use of opioids has tried surgery, injections, due to underlying health issues cannot take NSAIDs, has tried Tylenol  What other measures have been tried outside of opioids please see above  In the ongoing specialists regarding this condition back specialist  -when was the last dose patient took?  Earlier today  The patient was advised the importance of maintaining medication and not using illegal substances with these.  Here for refills and follow up  The patient was educated that we can provide 3 monthly scripts for their medication, it is their responsibility to follow the instructions.  Side effects or complications from medications: Denies side effects  Patient is aware that pain medications are meant to minimize the severity of the pain to allow their pain levels to improve to allow for better function. They are aware of that pain medications cannot totally remove their pain.  Due for UDT ( at least once per year) (pain management contract is also completed at the time of the UDT): Due on his next visit  Scale of 1 to 10 ( 1 is least 10 is most) Your pain level without the medicine: 7 Your pain level with medication 5  Scale 1 to 10 ( 1-helps very little, 10 helps very well) How well does your pain medication reduce your pain so you can function better  through out the day? 7  Quality of the pain: Throbbing aching  Persistence of the pain: Present all the time  Modifying factors: Worse with activity      Review of Systems     Objective:   Physical Exam  General-in no acute distress Eyes-no discharge Lungs-respiratory rate normal, CTA CV-no murmurs,RRR Extremities skin warm dry no edema Neuro grossly normal Behavior normal, alert       Assessment & Plan:  1. History of small bowel obstruction Patient has follow-up colonoscopy coming up in the near future  2. Encounter for long-term use of opiate analgesic The patient was seen in followup for chronic pain. A review over at their current pain status was discussed. Drug registry was checked. Prescriptions were given.  Regular follow-up recommended. Discussion was held regarding the importance of compliance with medication as well as pain medication contract.  Patient was informed that medication may cause drowsiness and should not be combined  with other medications/alcohol or street drugs. If the patient feels medication is causing altered alertness then do not drive or operate dangerous equipment.  Should be noted that the patient appears to be meeting appropriate use of opioids and response.  Evidenced by improved function and decent pain control without significant side effects and no evidence of overt aberrancy issues.  Upon discussion with the patient today they understand that opioid therapy is optional and they feel that the pain has been refractory to reasonable conservative  measures and is significant and affecting quality of life enough to warrant ongoing therapy and wishes to continue opioids.  Refills were provided.  [facility] medical Board guidelines regarding the pain medicine has been reviewed.  CDC guidelines most updated 2022 has been reviewed by the prescriber.  PDMP is checked on a regular basis yearly urine drug screen and pain management  contract  Pain management in October with urine drug screen

## 2022-12-17 ENCOUNTER — Encounter (HOSPITAL_COMMUNITY)
Admission: RE | Admit: 2022-12-17 | Discharge: 2022-12-17 | Disposition: A | Discharge status: Home / Self Care | Payer: PPO | Source: Ambulatory Visit | Attending: Gastroenterology | Admitting: Gastroenterology

## 2022-12-22 ENCOUNTER — Other Ambulatory Visit: Payer: Self-pay

## 2022-12-22 ENCOUNTER — Ambulatory Visit (HOSPITAL_COMMUNITY)
Admission: RE | Admit: 2022-12-22 | Discharge: 2022-12-22 | Disposition: A | Discharge status: Home / Self Care | Payer: PPO | Attending: Gastroenterology | Admitting: Gastroenterology

## 2022-12-22 ENCOUNTER — Ambulatory Visit (HOSPITAL_COMMUNITY): Payer: PPO | Admitting: Anesthesiology

## 2022-12-22 ENCOUNTER — Ambulatory Visit (HOSPITAL_BASED_OUTPATIENT_CLINIC_OR_DEPARTMENT_OTHER): Payer: PPO | Admitting: Anesthesiology

## 2022-12-22 ENCOUNTER — Encounter (HOSPITAL_COMMUNITY)
Admission: RE | Disposition: A | Discharge status: Home / Self Care | Payer: Self-pay | Source: Home / Self Care | Attending: Gastroenterology

## 2022-12-22 ENCOUNTER — Encounter (HOSPITAL_COMMUNITY): Payer: Self-pay | Admitting: Gastroenterology

## 2022-12-22 DIAGNOSIS — K219 Gastro-esophageal reflux disease without esophagitis: Secondary | ICD-10-CM | POA: Insufficient documentation

## 2022-12-22 DIAGNOSIS — Z86711 Personal history of pulmonary embolism: Secondary | ICD-10-CM | POA: Insufficient documentation

## 2022-12-22 DIAGNOSIS — I10 Essential (primary) hypertension: Secondary | ICD-10-CM | POA: Insufficient documentation

## 2022-12-22 DIAGNOSIS — G8929 Other chronic pain: Secondary | ICD-10-CM | POA: Insufficient documentation

## 2022-12-22 DIAGNOSIS — E785 Hyperlipidemia, unspecified: Secondary | ICD-10-CM | POA: Diagnosis not present

## 2022-12-22 DIAGNOSIS — Z1211 Encounter for screening for malignant neoplasm of colon: Secondary | ICD-10-CM | POA: Diagnosis not present

## 2022-12-22 DIAGNOSIS — D122 Benign neoplasm of ascending colon: Secondary | ICD-10-CM | POA: Diagnosis not present

## 2022-12-22 DIAGNOSIS — Z86718 Personal history of other venous thrombosis and embolism: Secondary | ICD-10-CM | POA: Diagnosis not present

## 2022-12-22 DIAGNOSIS — D126 Benign neoplasm of colon, unspecified: Secondary | ICD-10-CM

## 2022-12-22 DIAGNOSIS — Z8249 Family history of ischemic heart disease and other diseases of the circulatory system: Secondary | ICD-10-CM | POA: Insufficient documentation

## 2022-12-22 DIAGNOSIS — Z86010 Personal history of colonic polyps: Secondary | ICD-10-CM

## 2022-12-22 DIAGNOSIS — Z79899 Other long term (current) drug therapy: Secondary | ICD-10-CM | POA: Diagnosis not present

## 2022-12-22 DIAGNOSIS — K648 Other hemorrhoids: Secondary | ICD-10-CM | POA: Diagnosis not present

## 2022-12-22 DIAGNOSIS — K5730 Diverticulosis of large intestine without perforation or abscess without bleeding: Secondary | ICD-10-CM | POA: Diagnosis not present

## 2022-12-22 DIAGNOSIS — I4891 Unspecified atrial fibrillation: Secondary | ICD-10-CM | POA: Diagnosis not present

## 2022-12-22 DIAGNOSIS — D123 Benign neoplasm of transverse colon: Secondary | ICD-10-CM | POA: Diagnosis not present

## 2022-12-22 DIAGNOSIS — Z7901 Long term (current) use of anticoagulants: Secondary | ICD-10-CM | POA: Insufficient documentation

## 2022-12-22 DIAGNOSIS — G709 Myoneural disorder, unspecified: Secondary | ICD-10-CM | POA: Insufficient documentation

## 2022-12-22 DIAGNOSIS — K635 Polyp of colon: Secondary | ICD-10-CM | POA: Diagnosis not present

## 2022-12-22 DIAGNOSIS — Z87891 Personal history of nicotine dependence: Secondary | ICD-10-CM | POA: Insufficient documentation

## 2022-12-22 DIAGNOSIS — H409 Unspecified glaucoma: Secondary | ICD-10-CM | POA: Diagnosis not present

## 2022-12-22 DIAGNOSIS — Z85828 Personal history of other malignant neoplasm of skin: Secondary | ICD-10-CM | POA: Diagnosis not present

## 2022-12-22 DIAGNOSIS — I480 Paroxysmal atrial fibrillation: Secondary | ICD-10-CM | POA: Diagnosis not present

## 2022-12-22 DIAGNOSIS — M1990 Unspecified osteoarthritis, unspecified site: Secondary | ICD-10-CM | POA: Insufficient documentation

## 2022-12-22 HISTORY — PX: POLYPECTOMY: SHX5525

## 2022-12-22 HISTORY — PX: COLONOSCOPY WITH PROPOFOL: SHX5780

## 2022-12-22 SURGERY — COLONOSCOPY WITH PROPOFOL
Anesthesia: General

## 2022-12-22 MED ORDER — PHENYLEPHRINE 80 MCG/ML (10ML) SYRINGE FOR IV PUSH (FOR BLOOD PRESSURE SUPPORT)
PREFILLED_SYRINGE | INTRAVENOUS | Status: DC | PRN
  Administered 2022-12-22 (×2): 160 ug via INTRAVENOUS

## 2022-12-22 MED ORDER — PROPOFOL 500 MG/50ML IV EMUL
INTRAVENOUS | Status: DC | PRN
  Administered 2022-12-22: 125 ug/kg/min via INTRAVENOUS

## 2022-12-22 MED ORDER — PHENYLEPHRINE 80 MCG/ML (10ML) SYRINGE FOR IV PUSH (FOR BLOOD PRESSURE SUPPORT)
PREFILLED_SYRINGE | INTRAVENOUS | Status: AC
  Filled 2022-12-22: qty 10

## 2022-12-22 MED ORDER — LIDOCAINE HCL (PF) 2 % IJ SOLN
INTRAMUSCULAR | Status: AC
  Filled 2022-12-22: qty 10

## 2022-12-22 MED ORDER — LIDOCAINE HCL (CARDIAC) PF 100 MG/5ML IV SOSY
PREFILLED_SYRINGE | INTRAVENOUS | Status: DC | PRN
  Administered 2022-12-22: 80 mg via INTRAVENOUS

## 2022-12-22 MED ORDER — LACTATED RINGERS IV SOLN: INTRAVENOUS | Status: DC

## 2022-12-22 MED ORDER — PROPOFOL 500 MG/50ML IV EMUL
INTRAVENOUS | Status: AC
  Filled 2022-12-22: qty 50

## 2022-12-22 MED ORDER — PROPOFOL 10 MG/ML IV BOLUS
INTRAVENOUS | Status: DC | PRN
Start: 2022-12-22 — End: 2022-12-22
  Administered 2022-12-22: 30 mg via INTRAVENOUS
  Administered 2022-12-22: 80 mg via INTRAVENOUS

## 2022-12-22 NOTE — Anesthesia Preprocedure Evaluation (Signed)
Anesthesia Evaluation  Patient identified by MRN, date of birth, ID band Patient awake    Reviewed: Allergy & Precautions, H&P , NPO status , Patient's Chart, lab work & pertinent test results, reviewed documented beta blocker date and time   History of Anesthesia Complications (+) history of anesthetic complications  Airway Mallampati: III  TM Distance: >3 FB Neck ROM: Full    Dental  (+) Dental Advisory Given, Missing   Pulmonary pneumonia, former smoker, PE   Pulmonary exam normal breath sounds clear to auscultation       Cardiovascular Exercise Tolerance: Good hypertension, Pt. on medications and Pt. on home beta blockers + DVT  + dysrhythmias Atrial Fibrillation  Rhythm:Irregular Rate:Normal     Neuro/Psych  Neuromuscular disease  negative psych ROS   GI/Hepatic Neg liver ROS,GERD  Medicated,,  Endo/Other  negative endocrine ROS    Renal/GU negative Renal ROS  negative genitourinary   Musculoskeletal  (+) Arthritis , Osteoarthritis,    Abdominal   Peds negative pediatric ROS (+)  Hematology negative hematology ROS (+)   Anesthesia Other Findings Multiple back sx  Reproductive/Obstetrics negative OB ROS                             Anesthesia Physical Anesthesia Plan  ASA: 3  Anesthesia Plan: General   Post-op Pain Management: Minimal or no pain anticipated   Induction: Intravenous  PONV Risk Score and Plan: 1 and Treatment may vary due to age or medical condition  Airway Management Planned: Nasal Cannula and Natural Airway  Additional Equipment:   Intra-op Plan:   Post-operative Plan:   Informed Consent: I have reviewed the patients History and Physical, chart, labs and discussed the procedure including the risks, benefits and alternatives for the proposed anesthesia with the patient or authorized representative who has indicated his/her understanding and acceptance.      Dental advisory given  Plan Discussed with: CRNA and Surgeon  Anesthesia Plan Comments:         Anesthesia Quick Evaluation

## 2022-12-22 NOTE — Anesthesia Postprocedure Evaluation (Signed)
Anesthesia Post Note  Patient: [name]  Procedure(s) Performed: COLONOSCOPY WITH PROPOFOL POLYPECTOMY  Patient location during evaluation: Phase II Anesthesia Type: General Level of consciousness: awake and alert and oriented Pain management: pain level controlled Vital Signs Assessment: post-procedure vital signs reviewed and stable Respiratory status: spontaneous breathing, nonlabored ventilation and respiratory function stable Cardiovascular status: blood pressure returned to baseline and stable Postop Assessment: no apparent nausea or vomiting Anesthetic complications: no  No notable events documented.   Last Vitals:  Vitals:   12/22/22 1003 12/22/22 1213  BP: 139/85 114/85  Pulse: 82 78  Resp: 15 16  Temp: 36.6 C 36.4 C  SpO2: 100% 96%    Last Pain:  Vitals:   12/22/22 1213  TempSrc:   PainSc: 0-No pain                  C 

## 2022-12-22 NOTE — Discharge Instructions (Addendum)
You are being discharged to home.  Resume your previous diet.  We are waiting for your pathology results.  Your physician has recommended a repeat colonoscopy in three years for surveillance.  If recurrent small bowel obstruction, may consider outpatient evaluation by general surgery. Restart Eliquis tonight.

## 2022-12-22 NOTE — Op Note (Signed)
[facility] Patient Name: [name] Procedure Date: 12/22/2022 11:15 AM MRN: [id number] Date of Birth: Apr 17, 1941 Attending MD: Katrinka Blazing , , [id number] CSN: [id number] Age: 82 Admit Type: Outpatient Procedure:                Colonoscopy Indications:              Surveillance: Personal history of adenomatous                            polyps on last colonoscopy > 5 years ago Providers:                Katrinka Blazing, Edrick Kins, RN, Elinor Parkinson, Dyann Ruddle Referring MD:              Medicines:                Monitored Anesthesia Care Complications:            No immediate complications. Estimated Blood Loss:     Estimated blood loss: none. Procedure:                Pre-Anesthesia Assessment:                           - Prior to the procedure, a History and Physical                            was performed, and patient medications, allergies                            and sensitivities were reviewed. The patient's                            tolerance of previous anesthesia was reviewed.                           - The risks and benefits of the procedure and the                            sedation options and risks were discussed with the                            patient. All questions were answered and informed                            consent was obtained.                           - ASA Grade Assessment: II - A patient with mild                            systemic disease.                           After obtaining informed consent, the colonoscope  was passed under direct vision. Throughout the                            procedure, the patient's blood pressure, pulse, and                            oxygen saturations were monitored continuously. The                            [id number]-[id number] ([id number]) scope was introduced through                            the anus and advanced to the 10 cm into the ileum.                             The colonoscopy was performed without difficulty.                            The patient tolerated the procedure well. The                            quality of the bowel preparation was adequate. Scope In: 11:34:37 AM Scope Out: 12:06:53 PM Scope Withdrawal Time: 0 hours 21 minutes 51 seconds  Total Procedure Duration: 0 hours 32 minutes 16 seconds  Findings:      The perianal and digital rectal examinations were normal.      The terminal ileum appeared normal.      Five sessile polyps were found in the transverse colon and ascending       colon. The polyps were 4 to 8 mm in size. These polyps were removed with       a cold snare. Resection and retrieval were complete.      Scattered medium-mouthed and small-mouthed diverticula were found in the       sigmoid colon and descending colon.      Non-bleeding internal hemorrhoids were found during retroflexion. The       hemorrhoids were medium-sized. Impression:               - The examined portion of the ileum was normal.                           - Five 4 to 8 mm polyps in the transverse colon and                            in the ascending colon, removed with a cold snare.                            Resected and retrieved.                           - Diverticulosis in the sigmoid colon and in the                            descending colon.                           -  Non-bleeding internal hemorrhoids. Moderate Sedation:      Per Anesthesia Care Recommendation:           - Discharge patient to home (ambulatory).                           - Resume previous diet.                           - Await pathology results.                           - Repeat colonoscopy in 3 years for surveillance.                           - If recurrent small bowel obstruction, may                            consider outpatient evaluation by general surgery. Procedure Code(s):        --- Professional ---                            [phone], Colonoscopy, flexible; with removal of                            tumor(s), polyp(s), or other lesion(s) by snare                            technique Diagnosis Code(s):        --- Professional ---                           Z86.010, Personal history of colonic polyps                           K64.8, Other hemorrhoids                           D12.3, Benign neoplasm of transverse colon (hepatic                            flexure or splenic flexure)                           D12.2, Benign neoplasm of ascending colon                           K57.30, Diverticulosis of large intestine without                            perforation or abscess without bleeding CPT copyright 2022 American Medical Association. All rights reserved. The codes documented in this report are preliminary and upon coder review may  be revised to meet current compliance requirements. Katrinka Blazing, MD Katrinka Blazing,  12/22/2022 12:17:16 PM This report has been signed electronically. Number of Addenda: 0

## 2022-12-22 NOTE — Transfer of Care (Signed)
Immediate Anesthesia Transfer of Care Note  Patient: [name]  Procedure(s) Performed: COLONOSCOPY WITH PROPOFOL POLYPECTOMY  Patient Location: Short Stay  Anesthesia Type:General  Level of Consciousness: drowsy and patient cooperative  Airway & Oxygen Therapy: Patient Spontanous Breathing and Patient connected to nasal cannula oxygen  Post-op Assessment: Report given to RN and Post -op Vital signs reviewed and stable  Post vital signs: Reviewed and stable  Last Vitals:  Vitals Value Taken Time  BP    Temp    Pulse    Resp    SpO2      Last Pain:  Vitals:   12/22/22 1123  TempSrc:   PainSc: 0-No pain         Complications: No notable events documented.

## 2022-12-22 NOTE — Interval H&P Note (Signed)
History and Physical Interval Note:  12/22/2022 10:43 AM  [name]  has presented today for surgery, with the diagnosis of HX Adenomas.  The various methods of treatment have been discussed with the patient and family. After consideration of risks, benefits and other options for treatment, the patient has consented to  Procedure(s) with comments: COLONOSCOPY WITH PROPOFOL (N/A) - 11:15am;asa 3 as a surgical intervention.  The patient's history has been reviewed, patient examined, no change in status, stable for surgery.  I have reviewed the patient's chart and labs.  Questions were answered to the patient's satisfaction.     Katrinka Blazing Mayorga

## 2022-12-23 ENCOUNTER — Encounter (INDEPENDENT_AMBULATORY_CARE_PROVIDER_SITE_OTHER): Payer: Self-pay | Admitting: *Deleted

## 2022-12-25 ENCOUNTER — Encounter (HOSPITAL_COMMUNITY): Payer: Self-pay | Admitting: Gastroenterology

## 2023-01-01 DIAGNOSIS — Z6831 Body mass index (BMI) 31.0-31.9, adult: Secondary | ICD-10-CM | POA: Diagnosis not present

## 2023-01-01 DIAGNOSIS — G5603 Carpal tunnel syndrome, bilateral upper limbs: Secondary | ICD-10-CM | POA: Diagnosis not present

## 2023-01-02 ENCOUNTER — Telehealth: Payer: Self-pay | Admitting: Family Medicine

## 2023-01-02 NOTE — Telephone Encounter (Signed)
Nurses I received a note from neurosurgery stating they want to do a revision on his carpal tunnel surgery They are seeking permission for him to come off of his blood thinner From our perspective in order to do the surgery he would have to come off the blood thinner 2 days prior and then resume the blood thinner the day after surgery  There is a small risk that being off of the blood thinner could trigger a stroke but the likelihood of that is not big.  Also they cannot do the surgery if he is still on the medication.  So in other words we need [name] to understand the above and consent to coming off the Eliquis 2 days prior to surgery and resuming it the day after surgery  Please talk with him if he gives consent please document this then send it back to me then we can fill in the form for his neurosurgeon Dr. Barnett Abu  Thanks-Dr. Lorin Picket  Obviously if he says he is not coming off the blood thinner I would not do the surgery

## 2023-01-05 NOTE — Telephone Encounter (Signed)
That form should be somewhere within the office please forward back to my desk I will come by Thursday or Friday thank you

## 2023-01-06 NOTE — Telephone Encounter (Signed)
Form was completed ready to go

## 2023-01-13 ENCOUNTER — Ambulatory Visit: Payer: PPO | Admitting: Family Medicine

## 2023-01-21 DIAGNOSIS — G5601 Carpal tunnel syndrome, right upper limb: Secondary | ICD-10-CM | POA: Diagnosis not present

## 2023-01-21 DIAGNOSIS — Z9889 Other specified postprocedural states: Secondary | ICD-10-CM | POA: Diagnosis not present

## 2023-02-18 DIAGNOSIS — M5416 Radiculopathy, lumbar region: Secondary | ICD-10-CM | POA: Diagnosis not present

## 2023-03-09 DIAGNOSIS — M5414 Radiculopathy, thoracic region: Secondary | ICD-10-CM | POA: Diagnosis not present

## 2023-03-09 DIAGNOSIS — M5114 Intervertebral disc disorders with radiculopathy, thoracic region: Secondary | ICD-10-CM | POA: Diagnosis not present

## 2023-03-16 ENCOUNTER — Ambulatory Visit (INDEPENDENT_AMBULATORY_CARE_PROVIDER_SITE_OTHER): Payer: PPO | Admitting: Family Medicine

## 2023-03-16 ENCOUNTER — Encounter: Payer: Self-pay | Admitting: Family Medicine

## 2023-03-16 VITALS — BP 118/72 | HR 71 | Temp 98.2°F | Ht 66.0 in | Wt 192.4 lb

## 2023-03-16 DIAGNOSIS — M549 Dorsalgia, unspecified: Secondary | ICD-10-CM | POA: Diagnosis not present

## 2023-03-16 DIAGNOSIS — E7849 Other hyperlipidemia: Secondary | ICD-10-CM

## 2023-03-16 DIAGNOSIS — I10 Essential (primary) hypertension: Secondary | ICD-10-CM

## 2023-03-16 DIAGNOSIS — Z79899 Other long term (current) drug therapy: Secondary | ICD-10-CM | POA: Diagnosis not present

## 2023-03-16 DIAGNOSIS — E876 Hypokalemia: Secondary | ICD-10-CM | POA: Diagnosis not present

## 2023-03-16 DIAGNOSIS — Z79891 Long term (current) use of opiate analgesic: Secondary | ICD-10-CM | POA: Diagnosis not present

## 2023-03-16 DIAGNOSIS — D509 Iron deficiency anemia, unspecified: Secondary | ICD-10-CM | POA: Diagnosis not present

## 2023-03-16 DIAGNOSIS — Z23 Encounter for immunization: Secondary | ICD-10-CM | POA: Diagnosis not present

## 2023-03-16 DIAGNOSIS — G8929 Other chronic pain: Secondary | ICD-10-CM | POA: Diagnosis not present

## 2023-03-16 NOTE — Progress Notes (Signed)
Subjective:    Patient ID: [name], male    DOB: 1940/11/02, 82 y.o.   MRN: [id number]  Discussed the use of AI scribe software for clinical note transcription with the patient, who gave verbal consent to proceed.  History of Present Illness   The patient, with a history of chronic back pain, presents with worsening sleep disturbances due to discomfort. He reports an inability to lie down due to back pain, resulting in sleepless nights spent sitting in a recliner. The patient's pain is managed with regular pain medication, taken three to four times a day, and additional Tylenol around midday. However, the relief is described as only taking the edge off the pain.  The patient also reports a decline in appetite, with a recent tendency towards consuming sweets, which is unusual for him. Despite this, he reports adherence to his prescribed blood pressure and cholesterol medications.  The patient's emotional well-being appears to be significantly impacted by familial stressors, particularly concerning his spouse, Bonita Quin. The patient's religious devotion has reportedly caused tension in his relationship, leading to [place] moving out temporarily. This separation, coupled with Linda's health issues, has added to the patient's stress and may be contributing to his overall health decline.  The patient also mentions a history of alcohol use but reports abstinence for the past year. Despite the challenges, the patient remains active on his farm, albeit with difficulty due to his physical limitations.       This patient was seen today for chronic pain  The medication list was reviewed and updated.   Location of Pain for which the patient has been treated with regarding narcotics: Patient has pain in his neck and back area has had previous surgeries  Onset of this pain: Been present for years   -Compliance with medication: Good compliance  - Number patient states they take daily: Typically 4  tablets a day  -Reason for ongoing use of opioids does not get adequate relief with Tylenol not a good candidate for NSAIDs previous surgeries and injections have failed to alleviate his pain  What other measures have been tried outside of opioids surgeries, injections, physical therapy, OTC medicines  In the ongoing specialists regarding this condition back specialist  -when was the last dose patient took?  Earlier today  The patient was advised the importance of maintaining medication and not using illegal substances with these.  Here for refills and follow up  The patient was educated that we can provide 3 monthly scripts for their medication, it is their responsibility to follow the instructions.  Side effects or complications from medications: Denies side effects  Patient is aware that pain medications are meant to minimize the severity of the pain to allow their pain levels to improve to allow for better function. They are aware of that pain medications cannot totally remove their pain.  Due for UDT ( at least once per year) (pain management contract is also completed at the time of the UDT): Today  Scale of 1 to 10 ( 1 is least 10 is most) Your pain level without the medicine: 10 Your pain level with medication 8  Scale 1 to 10 ( 1-helps very little, 10 helps very well) How well does your pain medication reduce your pain so you can function better through out the day?  8  Quality of the pain: Throbbing aching  Persistence of the pain: Persistent all the time  Modifying factors: Worse with activity  Review of Systems     Objective:    Physical Exam   VITALS: BP- 118/72 CHEST: lungs clear to auscultation CARDIOVASCULAR: normal heart sounds    General-in no acute distress Eyes-no discharge Lungs-respiratory rate normal, CTA CV-no murmurs,RRR Extremities skin warm dry no edema Neuro grossly normal Behavior normal, alert Patient walks with cane        Assessment & Plan:  Assessment and Plan    Chronic Pain Reports joint pain and back pain. Pain is managed with pain medication 3-4 times a day and Tylenol 650mg  arthritis in the evening. Pain is not fully controlled, only takes the edge off. -Continue current pain management regimen.  Insomnia Reports inability to sleep at night, often sitting up all night in a chair. -Consider evaluation for sleep disorders or interventions to improve sleep hygiene.  Hypertension Blood pressure is well controlled at 118/72. -Continue current antihypertensive regimen.  Nutrition Reports poor eating habits, often eating sweets and not preparing meals at home. -Consider nutritional counseling to improve diet.  Psychosocial Stress Reports significant stress related to family issues and wife's health problems. -Consider referral to social work or counseling services to help manage stress.  General Health Maintenance -Continue current medications and follow-up as needed.      1. Immunization due Today - Flu Vaccine Trivalent High Dose (Fluad)  2. Encounter for long-term use of opiate analgesic The patient was seen in followup for chronic pain. A review over at their current pain status was discussed. Drug registry was checked. Prescriptions were given.  Regular follow-up recommended. Discussion was held regarding the importance of compliance with medication as well as pain medication contract.  Patient was informed that medication may cause drowsiness and should not be combined  with other medications/alcohol or street drugs. If the patient feels medication is causing altered alertness then do not drive or operate dangerous equipment.  Should be noted that the patient appears to be meeting appropriate use of opioids and response.  Evidenced by improved function and decent pain control without significant side effects and no evidence of overt aberrancy issues.  Upon discussion with the patient  today they understand that opioid therapy is optional and they feel that the pain has been refractory to reasonable conservative measures and is significant and affecting quality of life enough to warrant ongoing therapy and wishes to continue opioids.  Refills were provided.  [facility] medical Board guidelines regarding the pain medicine has been reviewed.  CDC guidelines most updated 2022 has been reviewed by the prescriber.  PDMP is checked on a regular basis yearly urine drug screen and pain management contract  - ToxASSURE Select 13 (MW), Urine  3. Chronic back pain greater than 3 months duration Patient to do stretching exercises further x-ray surgery or injections not indicated currently  4. Hypokalemia Monitor metabolic 7  5. Primary hypertension Continue blood pressure medicine check lab work - Basic Metabolic Panel  6. Other hyperlipidemia Continue cholesterol medicine check lab work - Lipid Panel  7. Iron deficiency anemia, unspecified iron deficiency anemia type History of anemia check lab work - CBC with Differential - Iron, TIBC and Ferritin Panel  8. High risk medication use Check lab work - Hepatic Function Panel

## 2023-03-17 LAB — MED LIST OPTION NOT SELECTED

## 2023-03-18 ENCOUNTER — Other Ambulatory Visit: Payer: Self-pay | Admitting: Family Medicine

## 2023-03-18 DIAGNOSIS — X32XXXD Exposure to sunlight, subsequent encounter: Secondary | ICD-10-CM | POA: Diagnosis not present

## 2023-03-18 DIAGNOSIS — L570 Actinic keratosis: Secondary | ICD-10-CM | POA: Diagnosis not present

## 2023-03-19 LAB — TOXASSURE SELECT 13 (MW), URINE

## 2023-03-19 LAB — SPECIMEN STATUS REPORT

## 2023-03-26 DIAGNOSIS — E7849 Other hyperlipidemia: Secondary | ICD-10-CM | POA: Diagnosis not present

## 2023-03-26 DIAGNOSIS — I10 Essential (primary) hypertension: Secondary | ICD-10-CM | POA: Diagnosis not present

## 2023-03-26 DIAGNOSIS — Z79899 Other long term (current) drug therapy: Secondary | ICD-10-CM | POA: Diagnosis not present

## 2023-03-26 DIAGNOSIS — D509 Iron deficiency anemia, unspecified: Secondary | ICD-10-CM | POA: Diagnosis not present

## 2023-03-27 ENCOUNTER — Encounter: Payer: Self-pay | Admitting: Family Medicine

## 2023-03-27 LAB — BASIC METABOLIC PANEL
BUN/Creatinine Ratio: 26 — ABNORMAL HIGH (ref 10–24)
BUN: 32 mg/dL — ABNORMAL HIGH (ref 8–27)
CO2: 23 mmol/L (ref 20–29)
Calcium: 9.5 mg/dL (ref 8.6–10.2)
Chloride: 98 mmol/L (ref 96–106)
Creatinine, Ser: 1.25 mg/dL (ref 0.76–1.27)
Glucose: 126 mg/dL — ABNORMAL HIGH (ref 70–99)
Potassium: 4.9 mmol/L (ref 3.5–5.2)
Sodium: 135 mmol/L (ref 134–144)
eGFR: 57 mL/min/{1.73_m2} — ABNORMAL LOW (ref >59)

## 2023-03-27 LAB — LIPID PANEL
Chol/HDL Ratio: 3.5 ratio (ref 0.0–5.0)
Cholesterol, Total: 141 mg/dL (ref 100–199)
HDL: 40 mg/dL (ref >39)
LDL Chol Calc (NIH): 80 mg/dL (ref 0–99)
Triglycerides: 113 mg/dL (ref 0–149)
VLDL Cholesterol Cal: 21 mg/dL (ref 5–40)

## 2023-03-27 LAB — CBC WITH DIFFERENTIAL/PLATELET
Basophils Absolute: 0.1 10*3/uL (ref 0.0–0.2)
Basos: 1 %
EOS (ABSOLUTE): 0.2 10*3/uL (ref 0.0–0.4)
Eos: 2 %
Hematocrit: 43.9 % (ref 37.5–51.0)
Hemoglobin: 14.2 g/dL (ref 13.0–17.7)
Immature Grans (Abs): 0.1 10*3/uL (ref 0.0–0.1)
Immature Granulocytes: 1 %
Lymphocytes Absolute: 1.3 10*3/uL (ref 0.7–3.1)
Lymphs: 15 %
MCH: 30.5 pg (ref 26.6–33.0)
MCHC: 32.3 g/dL (ref 31.5–35.7)
MCV: 94 fL (ref 79–97)
Monocytes Absolute: 1 10*3/uL — ABNORMAL HIGH (ref 0.1–0.9)
Monocytes: 11 %
Neutrophils Absolute: 6.4 10*3/uL (ref 1.4–7.0)
Neutrophils: 70 %
Platelets: 265 10*3/uL (ref 150–450)
RBC: 4.66 x10E6/uL (ref 4.14–5.80)
RDW: 13.2 % (ref 11.6–15.4)
WBC: 9.1 10*3/uL (ref 3.4–10.8)

## 2023-03-27 LAB — HEPATIC FUNCTION PANEL
ALT: 32 [IU]/L (ref 0–44)
AST: 24 [IU]/L (ref 0–40)
Albumin: 4.6 g/dL (ref 3.7–4.7)
Alkaline Phosphatase: 93 [IU]/L (ref 44–121)
Bilirubin Total: 1 mg/dL (ref 0.0–1.2)
Bilirubin, Direct: 0.35 mg/dL (ref 0.00–0.40)
Total Protein: 6.9 g/dL (ref 6.0–8.5)

## 2023-03-27 LAB — IRON,TIBC AND FERRITIN PANEL
Ferritin: 313 ng/mL (ref 30–400)
Iron Saturation: 54 % (ref 15–55)
Iron: 158 ug/dL (ref 38–169)
Total Iron Binding Capacity: 295 ug/dL (ref 250–450)
UIBC: 137 ug/dL (ref 111–343)

## 2023-03-27 NOTE — Progress Notes (Signed)
Please mail.

## 2023-04-04 ENCOUNTER — Other Ambulatory Visit: Payer: Self-pay | Admitting: Family Medicine

## 2023-04-04 DIAGNOSIS — K219 Gastro-esophageal reflux disease without esophagitis: Secondary | ICD-10-CM

## 2023-04-05 ENCOUNTER — Ambulatory Visit (INDEPENDENT_AMBULATORY_CARE_PROVIDER_SITE_OTHER): Payer: PPO | Admitting: Family Medicine

## 2023-04-05 VITALS — BP 112/68 | HR 82 | Temp 99.5°F | Ht 66.0 in | Wt 190.0 lb

## 2023-04-05 DIAGNOSIS — F321 Major depressive disorder, single episode, moderate: Secondary | ICD-10-CM | POA: Diagnosis not present

## 2023-04-05 DIAGNOSIS — R5383 Other fatigue: Secondary | ICD-10-CM

## 2023-04-05 DIAGNOSIS — K219 Gastro-esophageal reflux disease without esophagitis: Secondary | ICD-10-CM

## 2023-04-05 MED ORDER — PANTOPRAZOLE SODIUM 40 MG PO TBEC
40.0000 mg | DELAYED_RELEASE_TABLET | Freq: Two times a day (BID) | ORAL | 1 refills | Status: DC
Start: 2023-04-05 — End: 2023-09-08

## 2023-04-05 MED ORDER — SUCRALFATE 1 G PO TABS: ORAL_TABLET | ORAL | 1 refills | Status: DC

## 2023-04-05 NOTE — Progress Notes (Signed)
Subjective:    Patient ID: [name], male    DOB: [birth year], 82 y.o.   MRN: [id number]  Discussed the use of AI scribe software for clinical note transcription with the patient, who gave verbal consent to proceed.  History of Present Illness   The patient, with an unspecified medical history, presents with complaints of stomach discomfort described as "fumes coming off my stomach." He reports that this symptom is alleviated by taking pantoprazole at night. However, he has been experiencing hoarseness for the past three to four weeks, which he believes may be related to his stomach issues. He denies any pain or regurgitation associated with these symptoms.  The patient also reports significant sleep disturbances, often staying awake all night due to personal issues. He has tried taking over-the-counter sleep aids, which he believes may have provided some relief.  In addition to these issues, the patient reports difficulty walking due to weakness in his legs. He denies any back pain but describes his legs as so weak he can't stand for more than a minute. He also reports experiencing cramps in his thighs and tingling in his knees, which has led to near falls.  The patient also mentions a significant personal stressor related to a family member's decision to leave the home due to religious differences. This has caused considerable emotional distress for the patient, contributing to his sleep disturbances and overall mood.  The patient's symptoms and personal circumstances suggest a complex interplay of physical and emotional health issues that are impacting his overall well-being.         Review of Systems     Objective:    Physical Exam        General-in no acute distress Eyes-no discharge Lungs-respiratory rate normal, CTA CV-no murmurs,RRR Extremities skin warm dry no edema Neuro grossly normal Behavior normal, alert Neck no masses       Assessment & Plan:  Assessment  and Plan    Gastroesophageal Reflux Disease (GERD) Reports of fumes coming from stomach and hoarseness for the past 3-4 weeks. No reported pain or regurgitation. Currently on pantoprazole twice daily. -Start Carafate (sucralfate) three times a day, crushed and mixed in water to coat the esophagus. -Refer to gastroenterologist for further evaluation of esophagus.  Muscle Weakness Reports of leg weakness leading to difficulty standing and walking. No reported back pain. -Encouraged to follow up with back specialist for further evaluation and management.  Insomnia Reports of difficulty sleeping, often staying up all night. Currently trying over-the-counter sleep aids. -Consider a mild antidepressant to help improve sleep and mood.  Depression Reports of significant emotional distress related to family issues. -Consider a mild antidepressant to help improve sleep and mood. -Follow up in January, or sooner if symptoms do not improve.      1. Gastroesophageal reflux disease without esophagitis Continue acid blocker double up on dose add Carafate referral to gastroenterology More than likely is hoarseness related to this if that does not clear up may need ENT - pantoprazole (PROTONIX) 40 MG tablet; Take 1 tablet (40 mg total) by mouth 2 (two) times daily.  Dispense: 180 tablet; Refill: 1  2. Other fatigue Significant fatigue due to stress related issues and chronic health issues  3. Depression, major, single episode, moderate (HCC) We did discuss antidepressant Currently right now he is trying to work his way through it on his own If he gets worse he will notify us He has follow-up visit in several weeks

## 2023-04-09 ENCOUNTER — Other Ambulatory Visit: Payer: Self-pay

## 2023-04-09 DIAGNOSIS — K219 Gastro-esophageal reflux disease without esophagitis: Secondary | ICD-10-CM

## 2023-04-10 ENCOUNTER — Other Ambulatory Visit: Payer: Self-pay | Admitting: Family Medicine

## 2023-04-13 ENCOUNTER — Encounter (INDEPENDENT_AMBULATORY_CARE_PROVIDER_SITE_OTHER): Payer: Self-pay | Admitting: *Deleted

## 2023-04-20 ENCOUNTER — Telehealth: Payer: Self-pay | Admitting: *Deleted

## 2023-04-20 ENCOUNTER — Other Ambulatory Visit: Payer: Self-pay | Admitting: Family Medicine

## 2023-04-20 MED ORDER — OXYCODONE HCL 10 MG PO TABS: ORAL_TABLET | ORAL | 0 refills | Status: DC

## 2023-04-20 NOTE — Telephone Encounter (Signed)
Copied from CRM [id number]. Topic: Clinical - Medication Refill >> Apr 20, 2023 10:02 AM Thomes Dinning wrote: Most Recent Primary Care Visit:  Provider: Lilyan Punt A  Department: [facility]  Visit Type: OFFICE VISIT  Date: 04/05/2023  Medication: Oxycodone HCl 10 MG TABS  Has the patient contacted their pharmacy? Yes (Agent: If no, request that the patient contact the pharmacy for the refill. If patient does not wish to contact the pharmacy document the reason why and proceed with request.) (Agent: If yes, when and what did the pharmacy advise?)  Is this the correct pharmacy for this prescription? Yes If no, delete pharmacy and type the correct one.  This is the patient's preferred pharmacy:  [id number] - [place], [place] - [address] AT [facility] [address] [place] [address] Phone: [phone] Fax: [phone]   Has the prescription been filled recently? No  Is the patient out of the medication? Yes  Has the patient been seen for an appointment in the last year OR does the patient have an upcoming appointment? Yes  Can we respond through MyChart? Yes  Agent: Please be advised that Rx refills may take up to 3 business days. We ask that you follow-up with your pharmacy.

## 2023-04-20 NOTE — Telephone Encounter (Signed)
Patient notified

## 2023-04-20 NOTE — Telephone Encounter (Signed)
[facility] Please inform patient Also his follow-up pain management visit needs to be with myself mid February not with Dr. Adriana Simas thank you this will need to be changed

## 2023-04-26 ENCOUNTER — Telehealth: Payer: Self-pay | Admitting: Family Medicine

## 2023-04-26 NOTE — Telephone Encounter (Signed)
Pt needs to be sooner then Thursday he would like a call back is feeling unwell  for two months

## 2023-04-28 ENCOUNTER — Other Ambulatory Visit: Payer: Self-pay | Admitting: Family Medicine

## 2023-04-29 ENCOUNTER — Ambulatory Visit (INDEPENDENT_AMBULATORY_CARE_PROVIDER_SITE_OTHER): Payer: PPO | Admitting: Family Medicine

## 2023-04-29 ENCOUNTER — Other Ambulatory Visit: Payer: Self-pay

## 2023-04-29 ENCOUNTER — Other Ambulatory Visit: Payer: Self-pay | Admitting: Family Medicine

## 2023-04-29 ENCOUNTER — Encounter: Payer: Self-pay | Admitting: Family Medicine

## 2023-04-29 ENCOUNTER — Ambulatory Visit (HOSPITAL_COMMUNITY)
Admission: RE | Admit: 2023-04-29 | Discharge: 2023-04-29 | Disposition: A | Discharge status: Home / Self Care | Payer: PPO | Source: Ambulatory Visit | Attending: Family Medicine | Admitting: Family Medicine

## 2023-04-29 VITALS — BP 116/74 | HR 83 | Temp 97.0°F | Ht 66.0 in | Wt 189.0 lb

## 2023-04-29 DIAGNOSIS — K219 Gastro-esophageal reflux disease without esophagitis: Secondary | ICD-10-CM

## 2023-04-29 DIAGNOSIS — R5383 Other fatigue: Secondary | ICD-10-CM

## 2023-04-29 DIAGNOSIS — R9389 Abnormal findings on diagnostic imaging of other specified body structures: Secondary | ICD-10-CM | POA: Diagnosis not present

## 2023-04-29 DIAGNOSIS — I10 Essential (primary) hypertension: Secondary | ICD-10-CM

## 2023-04-29 DIAGNOSIS — R251 Tremor, unspecified: Secondary | ICD-10-CM

## 2023-04-29 DIAGNOSIS — D509 Iron deficiency anemia, unspecified: Secondary | ICD-10-CM

## 2023-04-29 DIAGNOSIS — I480 Paroxysmal atrial fibrillation: Secondary | ICD-10-CM | POA: Diagnosis not present

## 2023-04-29 DIAGNOSIS — R0609 Other forms of dyspnea: Secondary | ICD-10-CM | POA: Diagnosis not present

## 2023-04-29 DIAGNOSIS — J9811 Atelectasis: Secondary | ICD-10-CM | POA: Diagnosis not present

## 2023-04-29 DIAGNOSIS — E7849 Other hyperlipidemia: Secondary | ICD-10-CM

## 2023-04-29 DIAGNOSIS — Z79899 Other long term (current) drug therapy: Secondary | ICD-10-CM

## 2023-04-29 NOTE — Progress Notes (Signed)
   Subjective:    Patient ID: [name], male    DOB: 1940-11-22, 82 y.o.   MRN: [id number]   History of Present Illness   The patient presents with a chief complaint of hoarseness and fatigue, reporting a lack of energy. He also describes significant dyspnea on exertion, becoming breathless with minimal activity such as walking from his house to his truck or shop. He denies any chest heaviness or tightness, but describes the breathing as hard.  The patient also reports a recent fall, tripping over a bucket, which resulted in difficulty breathing and a period of time on the ground before he was able to get up. He denies any nocturnal dyspnea or lower leg swelling.  The patient's hoarseness seems to worsen throughout the day, and he has been using hard candy to help with this. He reports no difficulty with swallowing or regurgitation.  The patient has a history of back surgery, which he describes as having a negative impact on his overall health and well-being. He reports ongoing back pain, which is not relieved by pain medication.  The patient admits to occasionally forgetting to take his medication, despite having a system in place to remind him. He also describes a general lack of motivation or 'get up and go', but denies feeling depressed.  The patient's symptoms are concerning for either cardiac or pulmonary pathology, and further investigation is warranted.     The biggest concern I have is a patient has significant shortness of breath with activity he describes that when he tries to walk any distance he gets out of breath and has to stop he denies any chest tightness, denies chest pressure denies swelling in the legs no PND    Review of Systems     Objective:    Physical Exam   CHEST: Lungs clear to auscultation, no fluid detected. CARDIOVASCULAR: Heart sounds normal upon auscultation. EXTREMITIES: No swelling observed in the ankles.           Assessment & Plan:   Assessment and Plan    Shortness of Breath Significant dyspnea on exertion, even with minimal activity such as walking to the truck or shop. No nocturnal dyspnea or peripheral edema. No chest pain. -Order EKG, chest x-ray, and blood work to evaluate cardiac and pulmonary function. -Consider echocardiogram and consultation with cardiologist, Dr. Tenny Craw.  Hoarseness Chronic hoarseness, worsening throughout the day. No dysphagia, regurgitation, or heartburn. -Continue hard candy for symptomatic relief. -Consider ENT referral for laryngoscopy.  Chronic Pain Chronic pain managed with pain medication. Patient feels medication is less effective, but still necessary. -Continue current pain management regimen.  Fall Risk Recent fall due to tripping, with significant difficulty recovering. -Consider physical therapy or occupational therapy referral for fall prevention strategies.  Medication Adherence Occasional missed doses of medication.  Depression/Low Energy Reports of low energy and lack of motivation. -Monitor for signs of depression. Consider referral to mental health professional if symptoms persist or worsen.     Denies being depressed currently Will need further evaluation based on the results of lab work and x-rays

## 2023-05-01 LAB — CBC WITH DIFFERENTIAL/PLATELET
Basophils Absolute: 0.1 10*3/uL (ref 0.0–0.2)
Basos: 1 %
EOS (ABSOLUTE): 0.2 10*3/uL (ref 0.0–0.4)
Eos: 2 %
Hematocrit: 40.8 % (ref 37.5–51.0)
Hemoglobin: 13.4 g/dL (ref 13.0–17.7)
Immature Grans (Abs): 0 10*3/uL (ref 0.0–0.1)
Immature Granulocytes: 1 %
Lymphocytes Absolute: 1.2 10*3/uL (ref 0.7–3.1)
Lymphs: 20 %
MCH: 30.7 pg (ref 26.6–33.0)
MCHC: 32.8 g/dL (ref 31.5–35.7)
MCV: 93 fL (ref 79–97)
Monocytes Absolute: 0.8 10*3/uL (ref 0.1–0.9)
Monocytes: 12 %
Neutrophils Absolute: 4 10*3/uL (ref 1.4–7.0)
Neutrophils: 64 %
Platelets: 349 10*3/uL (ref 150–450)
RBC: 4.37 x10E6/uL (ref 4.14–5.80)
RDW: 13.4 % (ref 11.6–15.4)
WBC: 6.3 10*3/uL (ref 3.4–10.8)

## 2023-05-01 LAB — BASIC METABOLIC PANEL
BUN/Creatinine Ratio: 16 (ref 10–24)
BUN: 19 mg/dL (ref 8–27)
CO2: 23 mmol/L (ref 20–29)
Calcium: 9.8 mg/dL (ref 8.6–10.2)
Chloride: 104 mmol/L (ref 96–106)
Creatinine, Ser: 1.16 mg/dL (ref 0.76–1.27)
Glucose: 101 mg/dL — ABNORMAL HIGH (ref 70–99)
Potassium: 4.3 mmol/L (ref 3.5–5.2)
Sodium: 143 mmol/L (ref 134–144)
eGFR: 63 mL/min/{1.73_m2} (ref >59)

## 2023-05-01 LAB — D-DIMER, QUANTITATIVE: D-DIMER: 0.73 mg{FEU}/L — ABNORMAL HIGH (ref 0.00–0.49)

## 2023-05-01 LAB — BRAIN NATRIURETIC PEPTIDE: BNP: 321.6 pg/mL — ABNORMAL HIGH (ref 0.0–100.0)

## 2023-05-03 ENCOUNTER — Other Ambulatory Visit: Payer: Self-pay

## 2023-05-03 DIAGNOSIS — I480 Paroxysmal atrial fibrillation: Secondary | ICD-10-CM

## 2023-05-03 DIAGNOSIS — R0609 Other forms of dyspnea: Secondary | ICD-10-CM

## 2023-05-03 DIAGNOSIS — J849 Interstitial pulmonary disease, unspecified: Secondary | ICD-10-CM

## 2023-05-09 ENCOUNTER — Other Ambulatory Visit: Payer: Self-pay

## 2023-05-09 ENCOUNTER — Encounter (HOSPITAL_COMMUNITY): Payer: Self-pay | Admitting: *Deleted

## 2023-05-09 ENCOUNTER — Emergency Department (HOSPITAL_COMMUNITY)
Admission: EM | Admit: 2023-05-09 | Discharge: 2023-05-09 | Disposition: A | Discharge status: Home / Self Care | Payer: PPO | Attending: Emergency Medicine | Admitting: Emergency Medicine

## 2023-05-09 ENCOUNTER — Emergency Department (HOSPITAL_COMMUNITY): Payer: PPO

## 2023-05-09 DIAGNOSIS — K625 Hemorrhage of anus and rectum: Secondary | ICD-10-CM | POA: Diagnosis present

## 2023-05-09 DIAGNOSIS — K921 Melena: Secondary | ICD-10-CM | POA: Diagnosis not present

## 2023-05-09 DIAGNOSIS — I4891 Unspecified atrial fibrillation: Secondary | ICD-10-CM | POA: Diagnosis not present

## 2023-05-09 DIAGNOSIS — Z7901 Long term (current) use of anticoagulants: Secondary | ICD-10-CM | POA: Insufficient documentation

## 2023-05-09 DIAGNOSIS — K922 Gastrointestinal hemorrhage, unspecified: Secondary | ICD-10-CM | POA: Insufficient documentation

## 2023-05-09 DIAGNOSIS — N400 Enlarged prostate without lower urinary tract symptoms: Secondary | ICD-10-CM | POA: Diagnosis not present

## 2023-05-09 DIAGNOSIS — K5730 Diverticulosis of large intestine without perforation or abscess without bleeding: Secondary | ICD-10-CM | POA: Diagnosis not present

## 2023-05-09 LAB — HEPATIC FUNCTION PANEL
ALT: 30 U/L (ref 0–44)
AST: 25 U/L (ref 15–41)
Albumin: 4.2 g/dL (ref 3.5–5.0)
Alkaline Phosphatase: 66 U/L (ref 38–126)
Bilirubin, Direct: 0.2 mg/dL (ref 0.0–0.2)
Indirect Bilirubin: 0.7 mg/dL (ref 0.3–0.9)
Total Bilirubin: 0.9 mg/dL (ref <1.2)
Total Protein: 7.5 g/dL (ref 6.5–8.1)

## 2023-05-09 LAB — CBC WITH DIFFERENTIAL/PLATELET
Abs Immature Granulocytes: 0.03 10*3/uL (ref 0.00–0.07)
Basophils Absolute: 0 10*3/uL (ref 0.0–0.1)
Basophils Relative: 1 %
Eosinophils Absolute: 0.1 10*3/uL (ref 0.0–0.5)
Eosinophils Relative: 1 %
HCT: 42.2 % (ref 39.0–52.0)
Hemoglobin: 14.1 g/dL (ref 13.0–17.0)
Immature Granulocytes: 1 %
Lymphocytes Relative: 17 %
Lymphs Abs: 1.1 10*3/uL (ref 0.7–4.0)
MCH: 31.3 pg (ref 26.0–34.0)
MCHC: 33.4 g/dL (ref 30.0–36.0)
MCV: 93.6 fL (ref 80.0–100.0)
Monocytes Absolute: 0.6 10*3/uL (ref 0.1–1.0)
Monocytes Relative: 9 %
Neutro Abs: 4.7 10*3/uL (ref 1.7–7.7)
Neutrophils Relative %: 71 %
Platelets: 261 10*3/uL (ref 150–400)
RBC: 4.51 MIL/uL (ref 4.22–5.81)
RDW: 14.5 % (ref 11.5–15.5)
WBC: 6.4 10*3/uL (ref 4.0–10.5)
nRBC: 0 % (ref 0.0–0.2)

## 2023-05-09 LAB — BASIC METABOLIC PANEL
Anion gap: 10 (ref 5–15)
BUN: 21 mg/dL (ref 8–23)
CO2: 24 mmol/L (ref 22–32)
Calcium: 9.7 mg/dL (ref 8.9–10.3)
Chloride: 101 mmol/L (ref 98–111)
Creatinine, Ser: 1.23 mg/dL (ref 0.61–1.24)
GFR, Estimated: 59 mL/min — ABNORMAL LOW (ref >60)
Glucose, Bld: 212 mg/dL — ABNORMAL HIGH (ref 70–99)
Potassium: 4.5 mmol/L (ref 3.5–5.1)
Sodium: 135 mmol/L (ref 135–145)

## 2023-05-09 LAB — PROTIME-INR
INR: 1.3 — ABNORMAL HIGH (ref 0.8–1.2)
Prothrombin Time: 16.2 s — ABNORMAL HIGH (ref 11.4–15.2)

## 2023-05-09 MED ORDER — IOHEXOL 300 MG/ML  SOLN
100.0000 mL | Freq: Once | INTRAMUSCULAR | Status: AC | PRN
  Administered 2023-05-09: 100 mL via INTRAVENOUS

## 2023-05-09 NOTE — ED Provider Triage Note (Signed)
Emergency Medicine Provider Triage Evaluation Note  [name] , a 82 y.o. male  was evaluated in triage.  Pt complains of diarrhea and blood in his stool this morning.  Patient takes Eliquis daily for atrial fibrillation.  States he had loose numerous stools this morning that he described "looked like pus or infection."  Shortly after, he noticed bright red blood from his rectum.  Denies any abdominal pain or vomiting.  Endorses some generalized weakness.  No fever or chills.  He also complains of throat pain with painful swallowing but states this has been going on for over 1 month.  Scheduled for consultation with ENT.  Review of Systems  Positive: Loose stools, bright red blood from rectum Negative: Abdominal pain, vomiting  Physical Exam  BP (!) 156/109 (BP Location: Right Arm)   Pulse (!) 118   Temp 98.1 F (36.7 C) (Oral)   Resp 18   Ht 5\' 6"  (1.676 m)   Wt 79.4 kg   SpO2 99%   BMI 28.25 kg/m  Gen:   Awake, no distress   Resp:  Normal effort  MSK:   Moves extremities without difficulty  Other:    Medical Decision Making  Medically screening exam initiated at 2:09 PM.  Appropriate orders placed.  SAMANTHA PIETRZYK was informed that the remainder of the evaluation will be completed by another provider, this initial triage assessment does not replace that evaluation, and the importance of remaining in the ED until their evaluation is complete.     Pauline Aus, PA-C 05/09/23 1413

## 2023-05-09 NOTE — ED Provider Notes (Signed)
[facility] AT [facility] Provider Note   CSN: [id number] Arrival date & time: 05/09/23  1309     History  Chief Complaint  Patient presents with   GI Bleeding    [name] is a 82 y.o. male.  HPI    This patient is an 82 year old male on Eliquis for atrial fibrillation and a history of pulmonary embolism, history of diverticulosis based on a colonoscopy from several months ago by Dr. Levon Hedger.  Presents with several episodes of bright red blood per rectum today.  He reports that he was straining on a bowel movement, he had nothing in his system but felt like he had some tenesmus, subsequently he had several bloody excretions, there is no formed stool, he has no abdominal pain, no nausea or vomiting.  He reports that a couple of months ago he tried to drink some very hot coffee from a [employer], he felt like this called his mouth and his throat and down his esophagus and he immediately spit it out but ever since then he has had trouble swallowing.  He is scheduled to see gastroenterology tomorrow for consultation for an EGD.  He denies fevers chills nausea or vomiting, denies coughing or shortness of breath, denies chest pain other than this burning sensation which she has had for 2 months and swallowing hot coffee.  He has been prescribed what sounds like Carafate from his family doctor which she states is not helping.  Based on the medical record he is also on pantoprazole  Home Medications Prior to Admission medications   Medication Sig Start Date End Date Taking? Authorizing Provider  amLODipine (NORVASC) 10 MG tablet TAKE 1 TABLET BY MOUTH EVERY DAY 04/05/23   Luking, Jonna Coup, MD  ELIQUIS 5 MG TABS tablet TAKE 1 TABLET BY MOUTH TWICE A DAY. STOP PRADAXA 03/18/23   Luking, Jonna Coup, MD  indapamide (LOZOL) 1.25 MG tablet TAKE 1 TABLET BY MOUTH DAILY. 04/12/23   Babs Sciara, MD  latanoprost (XALATAN) 0.005 % ophthalmic solution Place 1  drop into both eyes at bedtime. 07/11/22   [provider]  metoprolol succinate (TOPROL-XL) 25 MG 24 hr tablet Take 1 tablet (25 mg total) by mouth 2 (two) times daily. 10/22/22 10/22/23  Mallipeddi, Vishnu P, MD  Oxycodone HCl 10 MG TABS 1 tablet taken 3 or 4 times daily as needed for pain caution drowsiness 04/20/23   Babs Sciara, MD  Oxycodone HCl 10 MG TABS 1 tablet taken 3 or 4 times daily as needed for pain caution drowsiness 04/20/23   Babs Sciara, MD  Oxycodone HCl 10 MG TABS 1 taken 4 times daily as needed for pain caution drowsiness 04/20/23   Luking, Jonna Coup, MD  pantoprazole (PROTONIX) 40 MG tablet Take 1 tablet (40 mg total) by mouth 2 (two) times daily. 04/05/23   Babs Sciara, MD  pravastatin (PRAVACHOL) 80 MG tablet TAKE 1 TABLET BY MOUTH EVERYDAY AT BEDTIME 04/05/23   Luking, Scott A, MD  psyllium (METAMUCIL SMOOTH TEXTURE) 58.6 % powder Take 1 packet by mouth at bedtime. 01/14/21   Malissa Hippo, MD  ramipril (ALTACE) 10 MG capsule Take 1 capsule (10 mg total) by mouth 2 (two) times daily. 10/13/22   Babs Sciara, MD  sucralfate (CARAFATE) 1 g tablet CRUSH MIX IN WATER USE THREE TIMES DAY FOR 30 DAYS 04/28/23   Babs Sciara, MD  tamsulosin (FLOMAX) 0.4 MG CAPS capsule Take 1  capsule (0.4 mg total) by mouth 2 (two) times daily. 11/26/22   Bjorn Pippin, MD  torsemide (DEMADEX) 10 MG tablet Take 1 tablet (10 mg total) by mouth daily. 02/26/22   Babs Sciara, MD  XDEMVY 0.25 % SOLN Apply to eye. 06/10/22   [provider]      Allergies    Sectral [acebutolol hcl], Sulfonamide derivatives, and Zocor [simvastatin]    Review of Systems   Review of Systems  All other systems reviewed and are negative.   Physical Exam Updated Vital Signs BP (!) 104/52 (BP Location: Right Arm)   Pulse 91   Temp 98.1 F (36.7 C) (Oral)   Resp 18   Ht 1.676 m (5\' 6" )   Wt 79.4 kg   SpO2 93%   BMI 28.25 kg/m  Physical Exam Vitals and nursing note reviewed.   Constitutional:      General: He is not in acute distress.    Appearance: He is well-developed.  HENT:     Head: Normocephalic and atraumatic.     Mouth/Throat:     Pharynx: No oropharyngeal exudate.     Comments: Oropharynx is clear, no signs of burn, phonation is normal, neck is very mobile, no trismus or torticollis Eyes:     General: No scleral icterus.       Right eye: No discharge.        Left eye: No discharge.     Conjunctiva/sclera: Conjunctivae normal.     Pupils: Pupils are equal, round, and reactive to light.  Neck:     Thyroid: No thyromegaly.     Vascular: No JVD.  Cardiovascular:     Rate and Rhythm: Normal rate and regular rhythm.     Heart sounds: Normal heart sounds. No murmur heard.    No friction rub. No gallop.  Pulmonary:     Effort: Pulmonary effort is normal. No respiratory distress.     Breath sounds: Normal breath sounds. No wheezing or rales.  Abdominal:     General: Bowel sounds are normal. There is no distension.     Palpations: Abdomen is soft. There is no mass.     Tenderness: There is no abdominal tenderness.  Musculoskeletal:        General: No tenderness. Normal range of motion.     Cervical back: Normal range of motion and neck supple.     Right lower leg: No edema.     Left lower leg: No edema.  Lymphadenopathy:     Cervical: No cervical adenopathy.  Skin:    General: Skin is warm and dry.     Findings: No erythema or rash.  Neurological:     Mental Status: He is alert.     Coordination: Coordination normal.  Psychiatric:        Behavior: Behavior normal.     ED Results / Procedures / Treatments   Labs (all labs ordered are listed, but only abnormal results are displayed) Labs Reviewed  BASIC METABOLIC PANEL - Abnormal; Notable for the following components:      Result Value   Glucose, Bld 212 (*)    GFR, Estimated 59 (*)    All other components within normal limits  PROTIME-INR - Abnormal; Notable for the following  components:   Prothrombin Time 16.2 (*)    INR 1.3 (*)    All other components within normal limits  CBC WITH DIFFERENTIAL/PLATELET  HEPATIC FUNCTION PANEL  URINALYSIS, ROUTINE W REFLEX MICROSCOPIC  EKG None  Radiology CT ABDOMEN PELVIS W CONTRAST Result Date: 05/09/2023 CLINICAL DATA:  Diarrhea and bloody stool. EXAM: CT ABDOMEN AND PELVIS WITH CONTRAST TECHNIQUE: Multidetector CT imaging of the abdomen and pelvis was performed using the standard protocol following bolus administration of intravenous contrast. RADIATION DOSE REDUCTION: This exam was performed according to the departmental dose-optimization program which includes automated exposure control, adjustment of the mA and/or kV according to patient size and/or use of iterative reconstruction technique. CONTRAST:  OMNIPAQUE IOHEXOL 300 MG/ML  SOLN COMPARISON:  10/26/2022 FINDINGS: Lower chest: No acute findings. Hepatobiliary: No suspicious focal abnormality within the liver parenchyma. There is no evidence for gallstones, gallbladder wall thickening, or pericholecystic fluid. No intrahepatic or extrahepatic biliary dilation. Pancreas: No focal mass lesion. No dilatation of the main duct. No intraparenchymal cyst. No peripancreatic edema. Spleen: No splenomegaly. No suspicious focal mass lesion. Adrenals/Urinary Tract: No adrenal nodule or mass. Tiny well-defined homogeneous low-density lesions in both kidneys are too small to characterize but are statistically most likely benign and probably cysts. No followup imaging is recommended. No evidence for hydroureter. Small hypervascular lesion is identified at the bladder base, adjacent to the prostate (axial 81/2 and coronal 62/4). This is probably an enhancing nodule in the prostate gland although mucosal lesion at the bladder base cannot be entirely excluded. Stomach/Bowel: Stomach is unremarkable. No gastric wall thickening. No evidence of outlet obstruction. Duodenum is normally  positioned as is the ligament of Treitz. No small bowel wall thickening. No small bowel dilatation. The terminal ileum is normal. The appendix is normal. No gross colonic mass. No colonic wall thickening. Diverticular changes are noted in the left colon without evidence of diverticulitis. Vascular/Lymphatic: There is moderate atherosclerotic calcification of the abdominal aorta without aneurysm. There is no gastrohepatic or hepatoduodenal ligament lymphadenopathy. No retroperitoneal or mesenteric lymphadenopathy. No pelvic sidewall lymphadenopathy. Reproductive: Prostate gland is enlarged. Other: No intraperitoneal free fluid. Musculoskeletal: No worrisome lytic or sclerotic osseous abnormality. Thoracolumbar fusion hardware evident. IMPRESSION: 1. No acute findings in the abdomen or pelvis. Specifically, no findings to explain the patient's history of diarrhea and bloody stool. 2. Left colonic diverticulosis without diverticulitis. 3. Small hypervascular lesion at the bladder base, adjacent to the prostate. This is probably an enhancing nodule in the prostate gland although mucosal lesion at the bladder base cannot be entirely excluded. Cystoscopy recommended to further evaluate. 4.  Aortic Atherosclerosis (ICD10-I70.0). Electronically Signed   By: Kennith Center M.D.   On: 05/09/2023 15:47    Procedures Procedures    Medications Ordered in ED Medications  iohexol (OMNIPAQUE) 300 MG/ML solution 100 mL (100 mLs Intravenous Contrast Given 05/09/23 1523)    ED Course/ Medical Decision Making/ A&P                                 Medical Decision Making Amount and/or Complexity of Data Reviewed Labs: ordered.   Soft nontender abdomen, respiratory exam is benign, mouth is benign, the patient has had a CT scan which shows no acute findings, nothing to give an answer for his bloody stool.  That being said he is on Eliquis last dose was last night, he has diverticulosis, this is a likely cause.  Will  discuss with gastroenterology, thankfully his labs are unremarkable   This patient presents to the ED for concern of gastrointestinal bleeding differential diagnosis includes diverticulosis, hemorrhoids, unlikely to be peptic ulcer disease    Additional  history obtained:  Additional history obtained from medical record External records from outside source obtained and reviewed including colonoscopy   Lab Tests:  I Ordered, and personally interpreted labs.  The pertinent results include: No anemia   Imaging Studies ordered:  I ordered imaging studies including CT scan of the abdomen and pelvis I independently visualized and interpreted imaging which showed no acute findings I agree with the radiologist interpretation   Medicines ordered and prescription drug management:  Holding Eliquis, discussed with Dr. Marletta Lor who will pass on information to the patient's primary gastroenterologist Dr. Levon Hedger, he agrees that the patient can be discharged and she has follow-up tomorrow with a normal hemoglobin, the patient had just taken his other daily medications prior to arrival Patient agreeable to the plan to follow-up tomorrow I have reviewed the patients home medicines and have made adjustments as needed   Problem List / ED Course:  Gastrointestinal bleeding Recheck heart rate 91 without any intervention, normotensive, no fever, no tachypnea Nontender abdomen I have discussed with the patient at the bedside the results, and the meaning of these results.  They have had opportunity to ask questions,  expressed their understanding to the need for follow-up with primary care physician   Social Determinants of Health:  Elderly           Final Clinical Impression(s) / ED Diagnoses Final diagnoses:  Lower GI bleeding    Rx / DC Orders ED Discharge Orders     None         Eber Hong, MD 05/09/23 1626

## 2023-05-09 NOTE — Discharge Instructions (Addendum)
Your blood test were normal, your heart rate is normal, I spoke with the GI specialist to request that you be seen at your doctor's office tomorrow at your appointment however if your bleeding becomes heavier or worse or you start to feel like you are going to pass out or become short of breath you need to come back to the hospital.  Do not take your Eliquis until you are instructed to do so by your doctor.  Not take any aspirin.

## 2023-05-09 NOTE — ED Triage Notes (Signed)
Pt not able to get up from toilet earlier today became weak and blood in stool- bright red in color. Pt is on Eliquis, did not take this morning.

## 2023-05-10 ENCOUNTER — Telehealth (INDEPENDENT_AMBULATORY_CARE_PROVIDER_SITE_OTHER): Payer: Self-pay | Admitting: Gastroenterology

## 2023-05-10 ENCOUNTER — Encounter (INDEPENDENT_AMBULATORY_CARE_PROVIDER_SITE_OTHER): Payer: Self-pay | Admitting: Gastroenterology

## 2023-05-10 ENCOUNTER — Other Ambulatory Visit (INDEPENDENT_AMBULATORY_CARE_PROVIDER_SITE_OTHER): Payer: Self-pay

## 2023-05-10 ENCOUNTER — Ambulatory Visit (INDEPENDENT_AMBULATORY_CARE_PROVIDER_SITE_OTHER): Payer: PPO | Admitting: Gastroenterology

## 2023-05-10 VITALS — BP 126/77 | HR 90 | Temp 97.1°F | Ht 66.0 in | Wt 184.8 lb

## 2023-05-10 DIAGNOSIS — R682 Dry mouth, unspecified: Secondary | ICD-10-CM | POA: Diagnosis not present

## 2023-05-10 DIAGNOSIS — Z87891 Personal history of nicotine dependence: Secondary | ICD-10-CM

## 2023-05-10 DIAGNOSIS — R634 Abnormal weight loss: Secondary | ICD-10-CM | POA: Diagnosis not present

## 2023-05-10 DIAGNOSIS — K625 Hemorrhage of anus and rectum: Secondary | ICD-10-CM | POA: Diagnosis not present

## 2023-05-10 DIAGNOSIS — K117 Disturbances of salivary secretion: Secondary | ICD-10-CM | POA: Insufficient documentation

## 2023-05-10 DIAGNOSIS — J392 Other diseases of pharynx: Secondary | ICD-10-CM | POA: Diagnosis not present

## 2023-05-10 DIAGNOSIS — K219 Gastro-esophageal reflux disease without esophagitis: Secondary | ICD-10-CM

## 2023-05-10 MED ORDER — NYSTATIN 100000 UNIT/ML MT SUSP: 5.0000 mL | Freq: Three times a day (TID) | OROMUCOSAL | 0 refills | Status: AC

## 2023-05-10 NOTE — H&P (View-Only) (Signed)
 Referring Provider: Babs Sciara, MD Primary Care Physician:  Babs Sciara, MD Primary GI Physician: Dr. Levon Hedger   Chief Complaint  Patient presents with   Gastroesophageal Reflux    Patient here today for a follow up on GERD and constipation. Patient says he has been having issues with being hoarse, every since drinking some scalding hot coffee in November. He is taking pantoprazole one or two times per day, usually just once per day. He is not having issues with constipation,but he is having seen some bright red blood in stools. He went to the ed yesterday and they told him he had a polyp that burst. He did see a small amount of blood in stool this am.    HPI:   [name] is a 82 y.o. male with past medical history of  arthritis, skin cancer, carpal tunnel syndrome, chronic back pain, DVT, GERD, glaucoma, HTN, HLD   Patient presenting today for rectal bleeding, dry mouth, changes in appetite and weight loss  Last seen July 2024, at that time patient had recent ED visit in June for high-grade SBO which was managed with decompression via NG tube.  He is feeling better at his OV.  Having a BM daily.  GERD well-controlled on Protonix.  Patient reminded continue with Metamucil nightly, good water intake, minimize opiate use is much as possible, schedule colonoscopy, consider CT and enterography for further evaluation of SBO colonoscopy unremarkable.  Colonoscopy as outlined below.  Notably patient presented to the ED yesterday afternoon with blood in stool reporting several episodes of bright red blood per rectum.  He also endorsed some issues with his throat and esophagus after drinking some hot coffee in November.  INR was 1.3 in the ED, hemoglobin 14.1.    Case was discussed with Dr. Marletta Lor who advised to hold patient's Eliquis for the next few days and repeat CBC in 1 week.  Present:  Patient states he woke up with rectal bleeding and abdominal cramping yesterday morning.  Spent all day in the ER. He notes that he drank some orange juice this morning and had resolution of cramping in his abdomen. Very little BRBPR this morning, with a very small spot of blood on toilet tissue.  he took eliquis last night. He meant to hold it but took it before thinking. He has some SOB but this has been chronic for 1-2 years. No dizziness. No change in bowel habits. Denies straining to defecate. No melena.   Notes on 11/2, he drank some very hot coffee. He took a sip and immediately spit it out though he had swallowed it and felt that it scalded him all the way down. He has a lot of hoarseness and dry mouth now. No hoarseness upon waking. Denies any odynophagia. Throat gets very dry but not painful. He eats some hard candy which seems to help some. He denies any dysphagia. Reports he has no appetite. If he talks for a while he will lose his voice.has occasional phlegm in his throat but does not think he is having heartburn or acid coming up. He denise any abdominal pain but notes he feels something is "off" in epigastric area."  He is taking protonix 40mg  1-2 times per day though does not note much difference when taking this. He denies nausea or vomiting. States appetite is not great. He denies early satiety. Has lost some weight, 197 lbs in August, down to 184 pounds today. He is doing carafate given to him  by PCP but this does not seem to be helping   Last Colonoscopy:12/2022 - The examined portion of the ileum was normal.                           - Five 4 to 8 mm polyps in the transverse colon and                            in the ascending colon, removed with a cold snare.                            Resected and retrieved.                           - Diverticulosis in the sigmoid colon and in the                            descending colon.                           - Non-bleeding internal hemorrhoids. (If recurrent small bowel obstruction may consider outpatient evaluation by general  surgery).  Last EGD 2019  - Normal proximal esophagus. - Longitudinal lines and circumferential rings in mid and distal esophagus. Biopsied. - Z-line irregular, 38 cm from the incisors. - 2 cm hiatal hernia. - Multiple gastric polyps. Biopsied. - Normal cardia, antrum and pylorus. - Normal duodenal bulb and second portion of the duodenum.  Recommendations:    Past Medical History:  Diagnosis Date   Arthritis    Cancer (HCC)    skin   Carpal tunnel syndrome    Cataract    left eye   Chronic back pain    multiple back surgeries   Complication of anesthesia    pt states woke up during hemorroid surgery   DVT (deep venous thrombosis) (HCC) 03/2007   left leg    Dysrhythmia    GERD (gastroesophageal reflux disease)    takes Protonix daily   Glaucoma    History of colon polyps    History of hyperglycemia    History of MRSA infection 1992   HTN (hypertension)    takes  Ramipril daily   Hyperlipidemia    takes Pravastatin 3 times a week   Nocturia    Paroxysmal atrial fibrillation (HCC)    takes Pradaxa daily, Chads2vasc score 2(07/11/14)   Pneumonia    many yrs ago   Pulmonary embolism (HCC) 2008    Past Surgical History:  Procedure Laterality Date   APPLICATION OF ROBOTIC ASSISTANCE FOR SPINAL PROCEDURE  05/06/2021   Procedure: APPLICATION OF ROBOTIC ASSISTANCE FOR SPINAL PROCEDURE;  Surgeon: Barnett Abu, MD;  Location: [facility];  Service: Neurosurgery;;   BACK SURGERY     BIOPSY  04/01/2018   Procedure: BIOPSY;  Surgeon: Malissa Hippo, MD;  Location: [facility];  Service: Endoscopy;;  gastric   CARDIAC CATHETERIZATION  05/18/2006   CARPAL TUNNEL RELEASE Right    cataract surgery Right    cervical and lumbar surgery     COLONOSCOPY  04/24/2011   Procedure: COLONOSCOPY;  Surgeon: Malissa Hippo, MD;  [facility]: [facility];  Service: Endoscopy;  Laterality: N/A;  1200   COLONOSCOPY N/A 08/13/2016   Procedure: COLONOSCOPY;  Surgeon: Malissa Hippo, MD;   [facility]: [facility];  Service: Endoscopy;  Laterality: N/A;  1200   COLONOSCOPY WITH PROPOFOL N/A 12/22/2022   Procedure: COLONOSCOPY WITH PROPOFOL;  Surgeon: Dolores Frame, MD;  [facility]: [facility];  Service: Gastroenterology;  Laterality: N/A;  11:15am;asa 3   ESOPHAGOGASTRODUODENOSCOPY  03/10/2012   Procedure: ESOPHAGOGASTRODUODENOSCOPY (EGD);  Surgeon: Malissa Hippo, MD;  [facility];  Service: Endoscopy;  Laterality: N/A;  325   ESOPHAGOGASTRODUODENOSCOPY (EGD) WITH PROPOFOL N/A 04/01/2018   Procedure: ESOPHAGOGASTRODUODENOSCOPY (EGD) WITH PROPOFOL;  Surgeon: Malissa Hippo, MD;  [facility];  Service: Endoscopy;  Laterality: N/A;   EYE SURGERY     HARDWARE REMOVAL N/A 04/24/2014   Procedure: REMOVAL OF SACRAL INSTRUMENTATION WITH METREX;  Surgeon: Barnett Abu, MD;  Location: [facility];  Service: Neurosurgery;  Laterality: N/A;   HEMORRHOIDECTOMY WITH HEMORRHOID BANDING     JOINT REPLACEMENT     KNEE ARTHROSCOPY Right    KNEE ARTHROSCOPY WITH MEDIAL MENISECTOMY Right 08/03/2018   Procedure: RIGHT KNEE ARTHROSCOPY WITH PARTIAL MEDIAL MENISCECTOMY;  Surgeon: Tarry Kos, MD;  Location: [facility];  Service: Orthopedics;  Laterality: Right;   KNEE SURGERY     left   LASIK     POLYPECTOMY  08/13/2016   Procedure: POLYPECTOMY;  Surgeon: Malissa Hippo, MD;  [facility];  Service: Endoscopy;;  colon   POLYPECTOMY  12/22/2022   Procedure: POLYPECTOMY;  Surgeon: Dolores Frame, MD;  [facility]: [facility];  Service: Gastroenterology;;   PVI  01/24/2010   afib ablation   TRANSFORAMINAL LUMBAR INTERBODY FUSION (TLIF) WITH PEDICLE SCREW FIXATION 1 LEVEL N/A 05/06/2021   Procedure: Transforaminal Lumbar Interbody Fusion  - Thoracic twelve-Lumbar one fixation Thoracic nine to Lumbar one with methacrylate augmentation with mazor;  Surgeon: Barnett Abu, MD;  Location: [facility];  Service: Neurosurgery;  Laterality:  N/A;    Current Outpatient Medications  Medication Sig Dispense Refill   amLODipine (NORVASC) 10 MG tablet TAKE 1 TABLET BY MOUTH EVERY DAY 90 tablet 1   [Paused] ELIQUIS 5 MG TABS tablet TAKE 1 TABLET BY MOUTH TWICE A DAY. STOP PRADAXA (Patient taking differently: 5 mg.) 60 tablet 5   indapamide (LOZOL) 1.25 MG tablet TAKE 1 TABLET BY MOUTH DAILY. 90 tablet 1   latanoprost (XALATAN) 0.005 % ophthalmic solution Place 1 drop into both eyes at bedtime.     metoprolol succinate (TOPROL-XL) 25 MG 24 hr tablet Take 1 tablet (25 mg total) by mouth 2 (two) times daily. 180 tablet 2   Oxycodone HCl 10 MG TABS 1 tablet taken 3 or 4 times daily as needed for pain caution drowsiness 120 tablet 0   pantoprazole (PROTONIX) 40 MG tablet Take 1 tablet (40 mg total) by mouth 2 (two) times daily. 180 tablet 1   pravastatin (PRAVACHOL) 80 MG tablet TAKE 1 TABLET BY MOUTH EVERYDAY AT BEDTIME 90 tablet 1   psyllium (METAMUCIL SMOOTH TEXTURE) 58.6 % powder Take 1 packet by mouth at bedtime.     ramipril (ALTACE) 10 MG capsule Take 1 capsule (10 mg total) by mouth 2 (two) times daily. 180 capsule 1   sucralfate (CARAFATE) 1 g tablet CRUSH MIX IN WATER USE THREE TIMES DAY FOR 30 DAYS 270 tablet 1   tamsulosin (FLOMAX) 0.4 MG CAPS capsule Take 1 capsule (0.4 mg total) by mouth 2 (two) times daily. 180 capsule 3   torsemide (DEMADEX) 10 MG tablet  Take 1 tablet (10 mg total) by mouth daily. 30 tablet 5   XDEMVY 0.25 % SOLN Apply to eye. (Patient not taking: Reported on 05/10/2023)     No current facility-administered medications for this visit.    Allergies as of 05/10/2023 - Review Complete 05/10/2023  Allergen Reaction Noted   Sectral [acebutolol hcl] Other (See Comments) 09/14/2012   Sulfonamide derivatives Other (See Comments)    Zocor [simvastatin] Other (See Comments) 09/14/2012    Family History  Problem Relation Age of Onset   Hypertension Mother    Transient ischemic attack Mother    Heart disease  Father    Diabetes Brother    Healthy Daughter    Colon cancer Sister    Hypertension Other    Heart disease Other     Social History   Socioeconomic History   Marital status: Married    Spouse name: Not on file   Number of children: Not on file   Years of education: Not on file   Highest education level: Not on file  Occupational History   Occupation: retired  Tobacco Use   Smoking status: Former    Current packs/day: 0.00    Average packs/day: 0.3 packs/day for 3.0 years (0.8 ttl pk-yrs)    Types: Cigarettes    Start date: 03/30/1966    Quit date: 03/30/1969    Years since quitting: 54.1    Passive exposure: Past   Smokeless tobacco: Never  Vaping Use   Vaping status: Never Used  Substance and Sexual Activity   Alcohol use: Yes    Alcohol/week: 14.0 standard drinks of alcohol    Types: 7 Glasses of wine, 7 Shots of liquor per week    Comment: Jack nightly   Drug use: No   Sexual activity: Yes  Other Topics Concern   Not on file  Social History Narrative   Not on file   Social Drivers of Health   Financial Resource Strain: Low Risk  (11/12/2020)   Overall Financial Resource Strain (CARDIA)    Difficulty of Paying Living Expenses: Not hard at all  Food Insecurity: No Food Insecurity (10/26/2022)   Hunger Vital Sign    Worried About Running Out of Food in the Last Year: Never true    Ran Out of Food in the Last Year: Never true  Transportation Needs: No Transportation Needs (10/26/2022)   PRAPARE - Administrator, Civil Service (Medical): No    Lack of Transportation (Non-Medical): No  Physical Activity: Sufficiently Active (11/25/2021)   Exercise Vital Sign    Days of Exercise per Week: 5 days    Minutes of Exercise per Session: 30 min  Stress: No Stress Concern Present (11/25/2021)   [employer] of Occupational Health - Occupational Stress Questionnaire    Feeling of Stress : Not at all  Social Connections: Socially Integrated (11/25/2021)    Social Connection and Isolation Panel [NHANES]    Frequency of Communication with Friends and Family: More than three times a week    Frequency of Social Gatherings with Friends and Family: More than three times a week    Attends Religious Services: More than 4 times per year    Active Member of [employer] or Organizations: Yes    Attends Engineer, structural: More than 4 times per year    Marital Status: Married    Review of systems General: negative for malaise, night sweats, fever, chills, weight los Neck: Negative for lumps, goiter, pain and  significant neck swelling Resp: Negative for cough, wheezing, dyspnea at rest CV: Negative for chest pain, leg swelling, palpitations, orthopnea GI: denies melena, hematochezia, nausea, vomiting, diarrhea, constipation, dysphagia, odyonophagia, early satiety or unintentional weight loss.  MSK: Negative for joint pain or swelling, back pain, and muscle pain. Derm: Negative for itching or rash Psych: Denies depression, anxiety, memory loss, confusion. No homicidal or suicidal ideation.  Heme: Negative for prolonged bleeding, bruising easily, and swollen nodes. Endocrine: Negative for cold or heat intolerance, polyuria, polydipsia and goiter. Neuro: negative for tremor, gait imbalance, syncope and seizures. The remainder of the review of systems is noncontributory.  Physical Exam: BP 126/77 (BP Location: Left Arm, Patient Position: Sitting, Cuff Size: Large)   Pulse 90   Temp (!) 97.1 F (36.2 C) (Temporal)   Ht 5\' 6"  (1.676 m)   Wt 184 lb 12.8 oz (83.8 kg)   BMI 29.83 kg/m  General:   Alert and oriented. No distress noted. Pleasant and cooperative.  Head:  Normocephalic and atraumatic. Eyes:  Conjuctiva clear without scleral icterus. Mouth: minimal patchy white coating on tongue  Heart: Normal rate and rhythm, s1 and s2 heart sounds present.  Lungs: Clear lung sounds in all lobes. Respirations equal and unlabored. Abdomen:  +BS, soft,  non-tender and non-distended. No rebound or guarding. No HSM or masses noted. Derm: No palmar erythema or jaundice Msk:  Symmetrical without gross deformities. Normal posture. Extremities:  Without edema. Neurologic:  Alert and  oriented x4 Psych:  Alert and cooperative. Normal mood and affect.  Invalid input(s): "6 MONTHS"   ASSESSMENT: [name] is a 83 y.o. male presenting today for rectal bleeding, dry mouth, changes in appetite and weight loss  Rectal bleeding: Since onset of rectal bleeding yesterday which seems to be tapering off today.  CT A/P with contrast yesterday with no acute findings, no diverticulitis noted.  Recent colonoscopy also with diverticulosis.  Suspect this is likely a diverticular bleed especially in the setting of Eliquis.  Case was discussed with Dr. Marletta Lor by EDP and again with me today.  As bleeding seems to be slowing down, recommend holding Eliquis until Tuesday night, repeat CBC in 1 week.  Patient is aware that if heavier bleeding recurs, he begins to feel symptomatic he should proceed to the ER.  Change in appetite/weight loss: Patient notably has had some changes in appetite and weight loss of around 13 pound since August.  He denies any nausea or vomiting.  He denies early satiety.  Notes he does have a sensation of something feeling "off" in epigastric area.  Given recent colonoscopy as above, would recommend proceeding with upper endoscopy for further evaluation of his symptoms as I cannot rule out PUD, gastritis, duodenitis, malignancy.  Dry mouth/throat: Patient having dry mouth and dry throat with some hoarseness after talking since early November, notably this began about a week after an incident with scalding hot coffee that he drink.  He was given some Carafate by his PCP which did not seem to help.  He denies any pain in his mouth though notes that he has to use hard candy throughout the day to help with the discomfort of how dry his mouth and throat  feel.  Upon exam there is some very minimal white patches to the tongue, will treat for Candida with Magic mouthwash which he can swish and swallow as he notes some throat discomfort as well.  He notably denies any odynophagia or dysphagia, though further evaluation can be  done during EGD as recommended above  PLAN:  Hold eliquis x48 hours  2. Repeat CBC 1 week  3. Schedule EGD ASA III  4. Magic mouthwash TID x7-10 days  5. ER precautions   All questions were answered, patient verbalized understanding and is in agreement with plan as outlined above.    Follow Up: 2 months   Leotis Isham L. Jeanmarie Hubert, MSN, APRN, AGNP-C Adult-Gerontology Nurse Practitioner [facility]

## 2023-05-10 NOTE — Telephone Encounter (Signed)
    05/10/23  [name] 1940/11/28  What type of surgery is being performed? EGD  When is surgery scheduled? 05/13/26  Clearance to hold Eliquis for 2 days (pt states he had stopped taking Eliquis but took it yesterday and didn't mean to)  Name of physician performing surgery?  Dr. Starleen Arms [facility] Phone: [phone] Fax: [phone]  Anethesia type (none, local, MAC, general)? MAC

## 2023-05-10 NOTE — Progress Notes (Signed)
Referring Provider: Babs Sciara, MD Primary Care Physician:  Babs Sciara, MD Primary GI Physician: Dr. Levon Hedger   Chief Complaint  Patient presents with   Gastroesophageal Reflux    Patient here today for a follow up on GERD and constipation. Patient says he has been having issues with being hoarse, every since drinking some scalding hot coffee in November. He is taking pantoprazole one or two times per day, usually just once per day. He is not having issues with constipation,but he is having seen some bright red blood in stools. He went to the ed yesterday and they told him he had a polyp that burst. He did see a small amount of blood in stool this am.    HPI:   [name] is a 82 y.o. male with past medical history of  arthritis, skin cancer, carpal tunnel syndrome, chronic back pain, DVT, GERD, glaucoma, HTN, HLD   Patient presenting today for rectal bleeding, dry mouth, changes in appetite and weight loss  Last seen July 2024, at that time patient had recent ED visit in June for high-grade SBO which was managed with decompression via NG tube.  He is feeling better at his OV.  Having a BM daily.  GERD well-controlled on Protonix.  Patient reminded continue with Metamucil nightly, good water intake, minimize opiate use is much as possible, schedule colonoscopy, consider CT and enterography for further evaluation of SBO colonoscopy unremarkable.  Colonoscopy as outlined below.  Notably patient presented to the ED yesterday afternoon with blood in stool reporting several episodes of bright red blood per rectum.  He also endorsed some issues with his throat and esophagus after drinking some hot coffee in November.  INR was 1.3 in the ED, hemoglobin 14.1.    Case was discussed with Dr. Marletta Lor who advised to hold patient's Eliquis for the next few days and repeat CBC in 1 week.  Present:  Patient states he woke up with rectal bleeding and abdominal cramping yesterday morning.  Spent all day in the ER. He notes that he drank some orange juice this morning and had resolution of cramping in his abdomen. Very little BRBPR this morning, with a very small spot of blood on toilet tissue.  he took eliquis last night. He meant to hold it but took it before thinking. He has some SOB but this has been chronic for 1-2 years. No dizziness. No change in bowel habits. Denies straining to defecate. No melena.   Notes on 11/2, he drank some very hot coffee. He took a sip and immediately spit it out though he had swallowed it and felt that it scalded him all the way down. He has a lot of hoarseness and dry mouth now. No hoarseness upon waking. Denies any odynophagia. Throat gets very dry but not painful. He eats some hard candy which seems to help some. He denies any dysphagia. Reports he has no appetite. If he talks for a while he will lose his voice.has occasional phlegm in his throat but does not think he is having heartburn or acid coming up. He denise any abdominal pain but notes he feels something is "off" in epigastric area."  He is taking protonix 40mg  1-2 times per day though does not note much difference when taking this. He denies nausea or vomiting. States appetite is not great. He denies early satiety. Has lost some weight, 197 lbs in August, down to 184 pounds today. He is doing carafate given to him  by PCP but this does not seem to be helping   Last Colonoscopy:12/2022 - The examined portion of the ileum was normal.                           - Five 4 to 8 mm polyps in the transverse colon and                            in the ascending colon, removed with a cold snare.                            Resected and retrieved.                           - Diverticulosis in the sigmoid colon and in the                            descending colon.                           - Non-bleeding internal hemorrhoids. (If recurrent small bowel obstruction may consider outpatient evaluation by general  surgery).  Last EGD 2019  - Normal proximal esophagus. - Longitudinal lines and circumferential rings in mid and distal esophagus. Biopsied. - Z-line irregular, 38 cm from the incisors. - 2 cm hiatal hernia. - Multiple gastric polyps. Biopsied. - Normal cardia, antrum and pylorus. - Normal duodenal bulb and second portion of the duodenum.  Recommendations:    Past Medical History:  Diagnosis Date   Arthritis    Cancer (HCC)    skin   Carpal tunnel syndrome    Cataract    left eye   Chronic back pain    multiple back surgeries   Complication of anesthesia    pt states woke up during hemorroid surgery   DVT (deep venous thrombosis) (HCC) 03/2007   left leg    Dysrhythmia    GERD (gastroesophageal reflux disease)    takes Protonix daily   Glaucoma    History of colon polyps    History of hyperglycemia    History of MRSA infection 1992   HTN (hypertension)    takes  Ramipril daily   Hyperlipidemia    takes Pravastatin 3 times a week   Nocturia    Paroxysmal atrial fibrillation (HCC)    takes Pradaxa daily, Chads2vasc score 2(07/11/14)   Pneumonia    many yrs ago   Pulmonary embolism (HCC) 2008    Past Surgical History:  Procedure Laterality Date   APPLICATION OF ROBOTIC ASSISTANCE FOR SPINAL PROCEDURE  05/06/2021   Procedure: APPLICATION OF ROBOTIC ASSISTANCE FOR SPINAL PROCEDURE;  Surgeon: Barnett Abu, MD;  [facility]: [facility];  Service: Neurosurgery;;   BACK SURGERY     BIOPSY  04/01/2018   Procedure: BIOPSY;  Surgeon: Malissa Hippo, MD;  [facility]: [facility];  Service: Endoscopy;;  gastric   CARDIAC CATHETERIZATION  05/18/2006   CARPAL TUNNEL RELEASE Right    cataract surgery Right    cervical and lumbar surgery     COLONOSCOPY  04/24/2011   Procedure: COLONOSCOPY;  Surgeon: Malissa Hippo, MD;  [facility];  Service: Endoscopy;  Laterality: N/A;  1200   COLONOSCOPY N/A 08/13/2016   Procedure: COLONOSCOPY;  Surgeon: Malissa Hippo, MD;   [facility];  Service: Endoscopy;  Laterality: N/A;  1200   COLONOSCOPY WITH PROPOFOL N/A 12/22/2022   Procedure: COLONOSCOPY WITH PROPOFOL;  Surgeon: Dolores Frame, MD;  [facility];  Service: Gastroenterology;  Laterality: N/A;  11:15am;asa 3   ESOPHAGOGASTRODUODENOSCOPY  03/10/2012   Procedure: ESOPHAGOGASTRODUODENOSCOPY (EGD);  Surgeon: Malissa Hippo, MD;  [facility];  Service: Endoscopy;  Laterality: N/A;  325   ESOPHAGOGASTRODUODENOSCOPY (EGD) WITH PROPOFOL N/A 04/01/2018   Procedure: ESOPHAGOGASTRODUODENOSCOPY (EGD) WITH PROPOFOL;  Surgeon: Malissa Hippo, MD;  [facility];  Service: Endoscopy;  Laterality: N/A;   EYE SURGERY     HARDWARE REMOVAL N/A 04/24/2014   Procedure: REMOVAL OF SACRAL INSTRUMENTATION WITH METREX;  Surgeon: Barnett Abu, MD;  Location: [facility];  Service: Neurosurgery;  Laterality: N/A;   HEMORRHOIDECTOMY WITH HEMORRHOID BANDING     JOINT REPLACEMENT     KNEE ARTHROSCOPY Right    KNEE ARTHROSCOPY WITH MEDIAL MENISECTOMY Right 08/03/2018   Procedure: RIGHT KNEE ARTHROSCOPY WITH PARTIAL MEDIAL MENISCECTOMY;  Surgeon: Tarry Kos, MD;  Location: [facility];  Service: Orthopedics;  Laterality: Right;   KNEE SURGERY     left   LASIK     POLYPECTOMY  08/13/2016   Procedure: POLYPECTOMY;  Surgeon: Malissa Hippo, MD;  [facility];  Service: Endoscopy;;  colon   POLYPECTOMY  12/22/2022   Procedure: POLYPECTOMY;  Surgeon: Dolores Frame, MD;  [facility];  Service: Gastroenterology;;   PVI  01/24/2010   afib ablation   TRANSFORAMINAL LUMBAR INTERBODY FUSION (TLIF) WITH PEDICLE SCREW FIXATION 1 LEVEL N/A 05/06/2021   Procedure: Transforaminal Lumbar Interbody Fusion  - Thoracic twelve-Lumbar one fixation Thoracic nine to Lumbar one with methacrylate augmentation with mazor;  Surgeon: Barnett Abu, MD;  Location: [facility];  Service: Neurosurgery;  Laterality:  N/A;    Current Outpatient Medications  Medication Sig Dispense Refill   amLODipine (NORVASC) 10 MG tablet TAKE 1 TABLET BY MOUTH EVERY DAY 90 tablet 1   [Paused] ELIQUIS 5 MG TABS tablet TAKE 1 TABLET BY MOUTH TWICE A DAY. STOP PRADAXA (Patient taking differently: 5 mg.) 60 tablet 5   indapamide (LOZOL) 1.25 MG tablet TAKE 1 TABLET BY MOUTH DAILY. 90 tablet 1   latanoprost (XALATAN) 0.005 % ophthalmic solution Place 1 drop into both eyes at bedtime.     metoprolol succinate (TOPROL-XL) 25 MG 24 hr tablet Take 1 tablet (25 mg total) by mouth 2 (two) times daily. 180 tablet 2   Oxycodone HCl 10 MG TABS 1 tablet taken 3 or 4 times daily as needed for pain caution drowsiness 120 tablet 0   pantoprazole (PROTONIX) 40 MG tablet Take 1 tablet (40 mg total) by mouth 2 (two) times daily. 180 tablet 1   pravastatin (PRAVACHOL) 80 MG tablet TAKE 1 TABLET BY MOUTH EVERYDAY AT BEDTIME 90 tablet 1   psyllium (METAMUCIL SMOOTH TEXTURE) 58.6 % powder Take 1 packet by mouth at bedtime.     ramipril (ALTACE) 10 MG capsule Take 1 capsule (10 mg total) by mouth 2 (two) times daily. 180 capsule 1   sucralfate (CARAFATE) 1 g tablet CRUSH MIX IN WATER USE THREE TIMES DAY FOR 30 DAYS 270 tablet 1   tamsulosin (FLOMAX) 0.4 MG CAPS capsule Take 1 capsule (0.4 mg total) by mouth 2 (two) times daily. 180 capsule 3   torsemide (DEMADEX) 10 MG tablet  Take 1 tablet (10 mg total) by mouth daily. 30 tablet 5   XDEMVY 0.25 % SOLN Apply to eye. (Patient not taking: Reported on 05/10/2023)     No current facility-administered medications for this visit.    Allergies as of 05/10/2023 - Review Complete 05/10/2023  Allergen Reaction Noted   Sectral [acebutolol hcl] Other (See Comments) 09/14/2012   Sulfonamide derivatives Other (See Comments)    Zocor [simvastatin] Other (See Comments) 09/14/2012    Family History  Problem Relation Age of Onset   Hypertension Mother    Transient ischemic attack Mother    Heart disease  Father    Diabetes Brother    Healthy Daughter    Colon cancer Sister    Hypertension Other    Heart disease Other     Social History   Socioeconomic History   Marital status: Married    Spouse name: Not on file   Number of children: Not on file   Years of education: Not on file   Highest education level: Not on file  Occupational History   Occupation: retired  Tobacco Use   Smoking status: Former    Current packs/day: 0.00    Average packs/day: 0.3 packs/day for 3.0 years (0.8 ttl pk-yrs)    Types: Cigarettes    Start date: 03/30/1966    Quit date: 03/30/1969    Years since quitting: 54.1    Passive exposure: Past   Smokeless tobacco: Never  Vaping Use   Vaping status: Never Used  Substance and Sexual Activity   Alcohol use: Yes    Alcohol/week: 14.0 standard drinks of alcohol    Types: 7 Glasses of wine, 7 Shots of liquor per week    Comment: Jack nightly   Drug use: No   Sexual activity: Yes  Other Topics Concern   Not on file  Social History Narrative   Not on file   Social Drivers of Health   Financial Resource Strain: Low Risk  (11/12/2020)   Overall Financial Resource Strain (CARDIA)    Difficulty of Paying Living Expenses: Not hard at all  Food Insecurity: No Food Insecurity (10/26/2022)   Hunger Vital Sign    Worried About Running Out of Food in the Last Year: Never true    Ran Out of Food in the Last Year: Never true  Transportation Needs: No Transportation Needs (10/26/2022)   PRAPARE - Administrator, Civil Service (Medical): No    Lack of Transportation (Non-Medical): No  Physical Activity: Sufficiently Active (11/25/2021)   Exercise Vital Sign    Days of Exercise per Week: 5 days    Minutes of Exercise per Session: 30 min  Stress: No Stress Concern Present (11/25/2021)   [employer] of Occupational Health - Occupational Stress Questionnaire    Feeling of Stress : Not at all  Social Connections: Socially Integrated (11/25/2021)    Social Connection and Isolation Panel [NHANES]    Frequency of Communication with Friends and Family: More than three times a week    Frequency of Social Gatherings with Friends and Family: More than three times a week    Attends Religious Services: More than 4 times per year    Active Member of [employer] or Organizations: Yes    Attends Engineer, structural: More than 4 times per year    Marital Status: Married    Review of systems General: negative for malaise, night sweats, fever, chills, weight los Neck: Negative for lumps, goiter, pain and  significant neck swelling Resp: Negative for cough, wheezing, dyspnea at rest CV: Negative for chest pain, leg swelling, palpitations, orthopnea GI: denies melena, hematochezia, nausea, vomiting, diarrhea, constipation, dysphagia, odyonophagia, early satiety or unintentional weight loss.  MSK: Negative for joint pain or swelling, back pain, and muscle pain. Derm: Negative for itching or rash Psych: Denies depression, anxiety, memory loss, confusion. No homicidal or suicidal ideation.  Heme: Negative for prolonged bleeding, bruising easily, and swollen nodes. Endocrine: Negative for cold or heat intolerance, polyuria, polydipsia and goiter. Neuro: negative for tremor, gait imbalance, syncope and seizures. The remainder of the review of systems is noncontributory.  Physical Exam: BP 126/77 (BP Location: Left Arm, Patient Position: Sitting, Cuff Size: Large)   Pulse 90   Temp (!) 97.1 F (36.2 C) (Temporal)   Ht 5\' 6"  (1.676 m)   Wt 184 lb 12.8 oz (83.8 kg)   BMI 29.83 kg/m  General:   Alert and oriented. No distress noted. Pleasant and cooperative.  Head:  Normocephalic and atraumatic. Eyes:  Conjuctiva clear without scleral icterus. Mouth: minimal patchy white coating on tongue  Heart: Normal rate and rhythm, s1 and s2 heart sounds present.  Lungs: Clear lung sounds in all lobes. Respirations equal and unlabored. Abdomen:  +BS, soft,  non-tender and non-distended. No rebound or guarding. No HSM or masses noted. Derm: No palmar erythema or jaundice Msk:  Symmetrical without gross deformities. Normal posture. Extremities:  Without edema. Neurologic:  Alert and  oriented x4 Psych:  Alert and cooperative. Normal mood and affect.  Invalid input(s): "6 MONTHS"   ASSESSMENT: [name] is a 83 y.o. male presenting today for rectal bleeding, dry mouth, changes in appetite and weight loss  Rectal bleeding: Since onset of rectal bleeding yesterday which seems to be tapering off today.  CT A/P with contrast yesterday with no acute findings, no diverticulitis noted.  Recent colonoscopy also with diverticulosis.  Suspect this is likely a diverticular bleed especially in the setting of Eliquis.  Case was discussed with Dr. Marletta Lor by EDP and again with me today.  As bleeding seems to be slowing down, recommend holding Eliquis until Tuesday night, repeat CBC in 1 week.  Patient is aware that if heavier bleeding recurs, he begins to feel symptomatic he should proceed to the ER.  Change in appetite/weight loss: Patient notably has had some changes in appetite and weight loss of around 13 pound since August.  He denies any nausea or vomiting.  He denies early satiety.  Notes he does have a sensation of something feeling "off" in epigastric area.  Given recent colonoscopy as above, would recommend proceeding with upper endoscopy for further evaluation of his symptoms as I cannot rule out PUD, gastritis, duodenitis, malignancy.  Dry mouth/throat: Patient having dry mouth and dry throat with some hoarseness after talking since early November, notably this began about a week after an incident with scalding hot coffee that he drink.  He was given some Carafate by his PCP which did not seem to help.  He denies any pain in his mouth though notes that he has to use hard candy throughout the day to help with the discomfort of how dry his mouth and throat  feel.  Upon exam there is some very minimal white patches to the tongue, will treat for Candida with Magic mouthwash which he can swish and swallow as he notes some throat discomfort as well.  He notably denies any odynophagia or dysphagia, though further evaluation can be  done during EGD as recommended above  PLAN:  Hold eliquis x48 hours  2. Repeat CBC 1 week  3. Schedule EGD ASA III  4. Magic mouthwash TID x7-10 days  5. ER precautions   All questions were answered, patient verbalized understanding and is in agreement with plan as outlined above.    Follow Up: 2 months   Leotis Isham L. Jeanmarie Hubert, MSN, APRN, AGNP-C Adult-Gerontology Nurse Practitioner [facility]

## 2023-05-10 NOTE — Telephone Encounter (Signed)
   Name: [name]  DOB: 11-14-1940  MRN: [id number]  Primary Cardiologist: None   Preoperative team, please contact this patient and set up a phone call appointment for further preoperative risk assessment. Please obtain consent and complete medication review. Thank you for your help.  I confirm that guidance regarding antiplatelet and oral anticoagulation therapy has been completed and, if necessary, noted below.  CHA2DS2-VASc Score = 3   This indicates a 3.2% annual risk of stroke. The patient's score is based upon: CHF History: 0 HTN History: 1 Diabetes History: 0 Stroke History: 0 Vascular Disease History: 0 Age Score: 2 Gender Score: 0   Does have history of DVT in 2008   CrCl 55 Platelet count 261   Per office protocol, patient can hold Eliquis until after procedure (was in ED yesterday and told to hold 2/2 GI bleed)     I also confirmed the patient resides in the state of [place]. As per [facility] Medical Board telemedicine laws, the patient must reside in the state in which the provider is licensed.   Ronney Asters, NP 05/10/2023, 1:40 PM [facility]

## 2023-05-10 NOTE — Telephone Encounter (Signed)
Patient with diagnosis of atrial fibrillation on Eliquis for anticoagulation.    Procedure: EGD Date of procedure: 05/14/23   CHA2DS2-VASc Score = 3   This indicates a 3.2% annual risk of stroke. The patient's score is based upon: CHF History: 0 HTN History: 1 Diabetes History: 0 Stroke History: 0 Vascular Disease History: 0 Age Score: 2 Gender Score: 0   Does have history of DVT in 2008  CrCl 55 Platelet count 261  Per office protocol, patient can hold Eliquis until after procedure (was in ED yesterday and told to hold 2/2 GI bleed)   **This guidance is not considered finalized until pre-operative APP has relayed final recommendations.**

## 2023-05-10 NOTE — Telephone Encounter (Signed)
Left message on machine for pt to contact the office.   

## 2023-05-10 NOTE — Patient Instructions (Signed)
Please hold eliquis until Tuesday night, you can resume regular dosing at that time I suspect your bleeding is from a diverticuli in the colon, We will repeat blood counts in 1 week If bleeding gets heavy, you have weakness, dizziness or pass out, you need to proceed back to the ER We will schedule EGD for further evaluation of you appetite changes and weight loss I have sent magic mouthwash to use 3 times per day for 7-10 days. Swish this for atleast 30 seconds then swallow  Follow up 2 months  It was a pleasure to see you today. I want to create trusting relationships with patients and provide genuine, compassionate, and quality care. I truly value your feedback! please be on the lookout for a survey regarding your visit with me today. I appreciate your input about our visit and your time in completing this!    Blakeley Scheier L. Jeanmarie Hubert, MSN, APRN, AGNP-C Adult-Gerontology Nurse Practitioner [facility]

## 2023-05-11 NOTE — Telephone Encounter (Signed)
2nd attempt to reach pt to schedule tele visit. No answer. Lvm

## 2023-05-13 ENCOUNTER — Encounter (HOSPITAL_COMMUNITY)
Admission: RE | Admit: 2023-05-13 | Discharge: 2023-05-13 | Disposition: A | Discharge status: Home / Self Care | Payer: PPO | Source: Ambulatory Visit | Attending: Gastroenterology | Admitting: Gastroenterology

## 2023-05-13 ENCOUNTER — Encounter (HOSPITAL_COMMUNITY): Payer: Self-pay

## 2023-05-13 NOTE — Anesthesia Preprocedure Evaluation (Addendum)
Anesthesia Evaluation  Patient identified by MRN, date of birth, ID band Patient awake    Reviewed: Allergy & Precautions, H&P , NPO status , Patient's Chart, lab work & pertinent test results, reviewed documented beta blocker date and time   History of Anesthesia Complications (+) history of anesthetic complications  Airway Mallampati: III  TM Distance: >3 FB Neck ROM: Full    Dental  (+) Dental Advisory Given, Missing, Upper Dentures   Pulmonary shortness of breath, pneumonia, former smoker, PE   Pulmonary exam normal breath sounds clear to auscultation       Cardiovascular Exercise Tolerance: Good hypertension, Pt. on medications and Pt. on home beta blockers + DVT  + dysrhythmias Atrial Fibrillation  Rhythm:Irregular Rate:Normal     Neuro/Psych  Neuromuscular disease  negative psych ROS   GI/Hepatic Neg liver ROS,GERD  Medicated,,  Endo/Other  negative endocrine ROS    Renal/GU negative Renal ROS  negative genitourinary   Musculoskeletal  (+) Arthritis , Osteoarthritis,    Abdominal Normal abdominal exam  (+)   Peds negative pediatric ROS (+)  Hematology negative hematology ROS (+)   Anesthesia Other Findings Multiple back sx  Reproductive/Obstetrics negative OB ROS                             Anesthesia Physical Anesthesia Plan  ASA: 3  Anesthesia Plan: General   Post-op Pain Management: Minimal or no pain anticipated   Induction: Intravenous  PONV Risk Score and Plan: 1 and Treatment may vary due to age or medical condition  Airway Management Planned: Nasal Cannula and Natural Airway  Additional Equipment: None  Intra-op Plan:   Post-operative Plan:   Informed Consent: I have reviewed the patients History and Physical, chart, labs and discussed the procedure including the risks, benefits and alternatives for the proposed anesthesia with the patient or authorized  representative who has indicated his/her understanding and acceptance.     Dental advisory given  Plan Discussed with: CRNA  Anesthesia Plan Comments:         Anesthesia Quick Evaluation

## 2023-05-14 ENCOUNTER — Ambulatory Visit (HOSPITAL_COMMUNITY): Payer: PPO | Admitting: Anesthesiology

## 2023-05-14 ENCOUNTER — Encounter (HOSPITAL_COMMUNITY)
Admission: RE | Disposition: A | Discharge status: Home / Self Care | Payer: Self-pay | Source: Home / Self Care | Attending: Gastroenterology

## 2023-05-14 ENCOUNTER — Ambulatory Visit (HOSPITAL_COMMUNITY)
Admission: RE | Admit: 2023-05-14 | Discharge: 2023-05-14 | Disposition: A | Discharge status: Home / Self Care | Payer: PPO | Attending: Gastroenterology | Admitting: Gastroenterology

## 2023-05-14 ENCOUNTER — Encounter (HOSPITAL_COMMUNITY): Payer: Self-pay | Admitting: Gastroenterology

## 2023-05-14 DIAGNOSIS — Z86718 Personal history of other venous thrombosis and embolism: Secondary | ICD-10-CM | POA: Insufficient documentation

## 2023-05-14 DIAGNOSIS — R634 Abnormal weight loss: Secondary | ICD-10-CM | POA: Diagnosis not present

## 2023-05-14 DIAGNOSIS — K30 Functional dyspepsia: Secondary | ICD-10-CM | POA: Diagnosis not present

## 2023-05-14 DIAGNOSIS — K921 Melena: Secondary | ICD-10-CM

## 2023-05-14 DIAGNOSIS — K2970 Gastritis, unspecified, without bleeding: Secondary | ICD-10-CM

## 2023-05-14 DIAGNOSIS — I10 Essential (primary) hypertension: Secondary | ICD-10-CM | POA: Diagnosis not present

## 2023-05-14 DIAGNOSIS — Z7901 Long term (current) use of anticoagulants: Secondary | ICD-10-CM | POA: Diagnosis not present

## 2023-05-14 DIAGNOSIS — K209 Esophagitis, unspecified without bleeding: Secondary | ICD-10-CM | POA: Diagnosis not present

## 2023-05-14 DIAGNOSIS — K5730 Diverticulosis of large intestine without perforation or abscess without bleeding: Secondary | ICD-10-CM | POA: Insufficient documentation

## 2023-05-14 DIAGNOSIS — K648 Other hemorrhoids: Secondary | ICD-10-CM | POA: Diagnosis not present

## 2023-05-14 DIAGNOSIS — K2100 Gastro-esophageal reflux disease with esophagitis, without bleeding: Secondary | ICD-10-CM | POA: Insufficient documentation

## 2023-05-14 DIAGNOSIS — I480 Paroxysmal atrial fibrillation: Secondary | ICD-10-CM | POA: Insufficient documentation

## 2023-05-14 DIAGNOSIS — K3189 Other diseases of stomach and duodenum: Secondary | ICD-10-CM | POA: Diagnosis not present

## 2023-05-14 DIAGNOSIS — I4891 Unspecified atrial fibrillation: Secondary | ICD-10-CM | POA: Diagnosis not present

## 2023-05-14 DIAGNOSIS — K449 Diaphragmatic hernia without obstruction or gangrene: Secondary | ICD-10-CM | POA: Diagnosis not present

## 2023-05-14 DIAGNOSIS — K317 Polyp of stomach and duodenum: Secondary | ICD-10-CM

## 2023-05-14 DIAGNOSIS — Z87891 Personal history of nicotine dependence: Secondary | ICD-10-CM | POA: Diagnosis not present

## 2023-05-14 DIAGNOSIS — Z86711 Personal history of pulmonary embolism: Secondary | ICD-10-CM | POA: Diagnosis not present

## 2023-05-14 DIAGNOSIS — K2289 Other specified disease of esophagus: Secondary | ICD-10-CM | POA: Diagnosis not present

## 2023-05-14 DIAGNOSIS — R1013 Epigastric pain: Secondary | ICD-10-CM | POA: Diagnosis present

## 2023-05-14 HISTORY — PX: ESOPHAGOGASTRODUODENOSCOPY (EGD) WITH PROPOFOL: SHX5813

## 2023-05-14 HISTORY — PX: BIOPSY: SHX5522

## 2023-05-14 HISTORY — PX: POLYPECTOMY: SHX5525

## 2023-05-14 SURGERY — ESOPHAGOGASTRODUODENOSCOPY (EGD) WITH PROPOFOL
Anesthesia: General

## 2023-05-14 MED ORDER — PROPOFOL 500 MG/50ML IV EMUL
INTRAVENOUS | Status: DC | PRN
  Administered 2023-05-14: 150 ug/kg/min via INTRAVENOUS

## 2023-05-14 MED ORDER — LACTATED RINGERS IV SOLN: INTRAVENOUS | Status: DC | PRN

## 2023-05-14 MED ORDER — PROPOFOL 10 MG/ML IV BOLUS
INTRAVENOUS | Status: DC | PRN
  Administered 2023-05-14: 80 mg via INTRAVENOUS

## 2023-05-14 NOTE — Op Note (Signed)
[facility] Patient Name: [name] Procedure Date: 05/14/2023 7:57 AM MRN: [id number] Date of Birth: 1941-01-06 Attending MD: Sanjuan Dame , MD, [id number] CSN: [id number] Age: 82 Admit Type: Outpatient Procedure:                Upper GI endoscopy Indications:              Dyspepsia, Hematochezia, Weight loss Providers:                Sanjuan Dame, MD, Buel Ream. Thomasena Edis RN, RN,                            Elinor Parkinson Referring MD:              Medicines:                Monitored Anesthesia Care Complications:            No immediate complications. Estimated Blood Loss:     Estimated blood loss was minimal. Procedure:                Pre-Anesthesia Assessment:                           - Prior to the procedure, a History and Physical                            was performed, and patient medications and                            allergies were reviewed. The patient's tolerance of                            previous anesthesia was also reviewed. The risks                            and benefits of the procedure and the sedation                            options and risks were discussed with the patient.                            All questions were answered, and informed consent                            was obtained. Prior Anticoagulants: The patient has                            taken Eliquis (apixaban), last dose was 4 days                            prior to procedure. ASA Grade Assessment: III - A                            patient with severe systemic disease. After  reviewing the risks and benefits, the patient was                            deemed in satisfactory condition to undergo the                            procedure.                           After obtaining informed consent, the endoscope was                            passed under direct vision. Throughout the                            procedure, the patient's blood  pressure, pulse, and                            oxygen saturations were monitored continuously. The                            [id number]-H190 ([id number]) scope was introduced through the                            mouth, and advanced to the third part of duodenum.                            The upper GI endoscopy was accomplished without                            difficulty. The patient tolerated the procedure                            well. Scope In: 8:14:04 AM Scope Out: 8:24:39 AM Total Procedure Duration: 0 hours 10 minutes 35 seconds  Findings:      No gross lesions were noted in the entire esophagus. Biopsies were       obtained from the proximal and distal esophagus with cold forceps for       histology of suspected eosinophilic esophagitis.      Diffuse mucosal changes characterized by granularity were found in the       entire examined stomach. Biopsies were taken with a cold forceps for       histology.      Multiple 2 to 8 mm sessile polyps with no bleeding and no stigmata of       recent bleeding were found in the entire examined stomach. The polyp was       removed with a cold snare. Resection and retrieval were complete.      The duodenal bulb, second portion of the duodenum and third portion of       the duodenum were normal. Impression:               - No gross lesions in the entire esophagus.                           -  Granular mucosa in the stomach. Biopsied.                           - Multiple gastric polyps. Resected and retrieved;                            largest representative polypectomy .                           - Normal duodenal bulb, second portion of the                            duodenum and third portion of the duodenum.                           - Biopsies were taken with a cold forceps for                            evaluation of eosinophilic esophagitis. Moderate Sedation:      Per Anesthesia Care Recommendation:           - Patient has a contact  number available for                            emergencies. The signs and symptoms of potential                            delayed complications were discussed with the                            patient. Return to normal activities tomorrow.                            Written discharge instructions were provided to the                            patient.                           - Resume previous diet.                           - Continue present medications.                           - Await pathology results.                           - Repeat upper endoscopy for surveillance based on                            pathology results.                           - Return to GI clinic as previously scheduled. Procedure Code(s):        --- Professional ---  43251, Esophagogastroduodenoscopy, flexible,                            transoral; with removal of tumor(s), polyp(s), or                            other lesion(s) by snare technique                           43239, 59, Esophagogastroduodenoscopy, flexible,                            transoral; with biopsy, single or multiple Diagnosis Code(s):        --- Professional ---                           K31.89, Other diseases of stomach and duodenum                           K31.7, Polyp of stomach and duodenum                           R10.13, Epigastric pain                           K92.1, Melena (includes Hematochezia)                           R63.4, Abnormal weight loss CPT copyright 2022 American Medical Association. All rights reserved. The codes documented in this report are preliminary and upon coder review may  be revised to meet current compliance requirements. Sanjuan Dame, MD Sanjuan Dame, MD 05/14/2023 8:29:47 AM This report has been signed electronically. Number of Addenda: 0

## 2023-05-14 NOTE — Transfer of Care (Signed)
Immediate Anesthesia Transfer of Care Note  Patient: [name]  Procedure(s) Performed: ESOPHAGOGASTRODUODENOSCOPY (EGD) WITH PROPOFOL BIOPSY POLYPECTOMY  Patient Location: Short Stay  Anesthesia Type:General  Level of Consciousness: awake  Airway & Oxygen Therapy: Patient Spontanous Breathing  Post-op Assessment: Report given to RN  Post vital signs: Reviewed and stable  Last Vitals:  Vitals Value Taken Time  BP    Temp    Pulse    Resp    SpO2      Last Pain:  Vitals:   05/14/23 0813  PainSc: 0-No pain         Complications: No notable events documented.

## 2023-05-14 NOTE — Discharge Instructions (Signed)

## 2023-05-14 NOTE — Telephone Encounter (Signed)
I will forward to preop APP to review. Looks like pt had his procedure today.

## 2023-05-14 NOTE — Interval H&P Note (Signed)
History and Physical Interval Note:  05/14/2023 7:58 AM  [name]  has presented today for surgery, with the diagnosis of WEIGHT LOSS, DECREASED APPETITE.  The various methods of treatment have been discussed with the patient and family. After consideration of risks, benefits and other options for treatment, the patient has consented to  Procedure(s) with comments: ESOPHAGOGASTRODUODENOSCOPY (EGD) WITH PROPOFOL (N/A) - 8:00am;asa 3 as a surgical intervention.  The patient's history has been reviewed, patient examined, no change in status, stable for surgery.  I have reviewed the patient's chart and labs.  Questions were answered to the patient's satisfaction.     [name] [name]

## 2023-05-14 NOTE — Anesthesia Postprocedure Evaluation (Signed)
Anesthesia Post Note  Patient: [name]  Procedure(s) Performed: ESOPHAGOGASTRODUODENOSCOPY (EGD) WITH PROPOFOL BIOPSY POLYPECTOMY  Patient location during evaluation: Short Stay Anesthesia Type: General Level of consciousness: awake and alert Pain management: pain level controlled Vital Signs Assessment: post-procedure vital signs reviewed and stable Respiratory status: spontaneous breathing Cardiovascular status: blood pressure returned to baseline and stable Postop Assessment: no apparent nausea or vomiting Anesthetic complications: no   No notable events documented.   Last Vitals:  Vitals:   05/14/23 0721  BP: 125/84  Pulse: 94  Resp: 18  Temp: 36.9 C  SpO2: 98%    Last Pain:  Vitals:   05/14/23 0813  PainSc: 0-No pain                 Geena Weinhold

## 2023-05-17 DIAGNOSIS — K625 Hemorrhage of anus and rectum: Secondary | ICD-10-CM | POA: Diagnosis not present

## 2023-05-17 LAB — CBC
HCT: 40.1 % (ref 38.5–50.0)
Hemoglobin: 12.9 g/dL — ABNORMAL LOW (ref 13.2–17.1)
MCH: 30.8 pg (ref 27.0–33.0)
MCHC: 32.2 g/dL (ref 32.0–36.0)
MCV: 95.7 fL (ref 80.0–100.0)
MPV: 9.8 fL (ref 7.5–12.5)
Platelets: 236 10*3/uL (ref 140–400)
RBC: 4.19 10*6/uL — ABNORMAL LOW (ref 4.20–5.80)
RDW: 13.6 % (ref 11.0–15.0)
WBC: 5.6 10*3/uL (ref 3.8–10.8)

## 2023-05-17 LAB — SURGICAL PATHOLOGY

## 2023-05-18 NOTE — Progress Notes (Signed)
I reviewed the pathology results. Ann, can you send her a letter with the findings as described below please?  Thanks,  Vista Lawman, MD Gastroenterology and Hepatology [facility] Gastroenterology  ---------------------------------------------------------------------------------------------  [facility] [address], [address], [place], [place] [address] Phone:  [phone]   05/18/23 [place], [place]   Dear Guinevere Ferrari,  I am writing to inform you that the biopsies taken during your recent endoscopic examination showed:  The polyp removed from the stomach is benign, fundic gland polyp and nothing to be worried about  Stomach biopsies negative for bacteria called H. pylori which is a good news  Also esophageal biopsy shows reflux:  I recommend taking pantoprazole 40 mg 30 minutes before breakfast   Also I value your feedback , so if you get a survey , please take the time to fill it out and thank you for choosing /[facility]  Please call us at [phone] if you have persistent problems or have questions about your condition that have not been fully answered at this time.  Sincerely,  Vista Lawman, MD Gastroenterology and Hepatology

## 2023-05-20 ENCOUNTER — Encounter (INDEPENDENT_AMBULATORY_CARE_PROVIDER_SITE_OTHER): Payer: Self-pay | Admitting: *Deleted

## 2023-05-21 ENCOUNTER — Ambulatory Visit (HOSPITAL_COMMUNITY)
Admission: RE | Admit: 2023-05-21 | Discharge: 2023-05-21 | Disposition: A | Discharge status: Home / Self Care | Payer: PPO | Source: Ambulatory Visit | Attending: Family Medicine | Admitting: Family Medicine

## 2023-05-21 ENCOUNTER — Other Ambulatory Visit (INDEPENDENT_AMBULATORY_CARE_PROVIDER_SITE_OTHER): Payer: Self-pay

## 2023-05-21 ENCOUNTER — Other Ambulatory Visit: Payer: Self-pay | Admitting: Family Medicine

## 2023-05-21 DIAGNOSIS — K625 Hemorrhage of anus and rectum: Secondary | ICD-10-CM

## 2023-05-21 DIAGNOSIS — R918 Other nonspecific abnormal finding of lung field: Secondary | ICD-10-CM | POA: Diagnosis not present

## 2023-05-21 DIAGNOSIS — J849 Interstitial pulmonary disease, unspecified: Secondary | ICD-10-CM | POA: Diagnosis not present

## 2023-05-21 DIAGNOSIS — R591 Generalized enlarged lymph nodes: Secondary | ICD-10-CM | POA: Diagnosis not present

## 2023-05-21 DIAGNOSIS — R0609 Other forms of dyspnea: Secondary | ICD-10-CM | POA: Diagnosis not present

## 2023-05-21 MED ORDER — IOHEXOL 350 MG/ML SOLN
75.0000 mL | Freq: Once | INTRAVENOUS | Status: AC | PRN
  Administered 2023-05-21: 75 mL via INTRAVENOUS

## 2023-05-28 ENCOUNTER — Encounter (HOSPITAL_COMMUNITY): Payer: Self-pay | Admitting: Gastroenterology

## 2023-06-11 ENCOUNTER — Ambulatory Visit (HOSPITAL_COMMUNITY)
Admission: RE | Admit: 2023-06-11 | Discharge: 2023-06-11 | Disposition: A | Discharge status: Home / Self Care | Payer: PPO | Source: Ambulatory Visit | Attending: Family Medicine | Admitting: Family Medicine

## 2023-06-11 DIAGNOSIS — R0609 Other forms of dyspnea: Secondary | ICD-10-CM | POA: Insufficient documentation

## 2023-06-11 DIAGNOSIS — I480 Paroxysmal atrial fibrillation: Secondary | ICD-10-CM | POA: Diagnosis not present

## 2023-06-11 LAB — ECHOCARDIOGRAM COMPLETE
AR max vel: 1.75 cm2
AV Area VTI: 1.96 cm2
AV Area mean vel: 1.69 cm2
AV Mean grad: 11 mm[Hg]
AV Peak grad: 16.9 mm[Hg]
Ao pk vel: 2.06 m/s
Area-P 1/2: 3.85 cm2
S' Lateral: 2.7 cm

## 2023-06-11 NOTE — Progress Notes (Signed)
*  PRELIMINARY RESULTS* Echocardiogram 2D Echocardiogram has been performed.  [name] 06/11/2023, 10:23 AM

## 2023-06-12 ENCOUNTER — Encounter: Payer: Self-pay | Admitting: Family Medicine

## 2023-06-15 ENCOUNTER — Encounter: Payer: Self-pay | Admitting: Family Medicine

## 2023-06-16 ENCOUNTER — Ambulatory Visit (INDEPENDENT_AMBULATORY_CARE_PROVIDER_SITE_OTHER): Payer: PPO | Admitting: Family Medicine

## 2023-06-16 VITALS — BP 119/68 | HR 61 | Temp 98.4°F | Ht 66.0 in | Wt 187.0 lb

## 2023-06-16 DIAGNOSIS — I480 Paroxysmal atrial fibrillation: Secondary | ICD-10-CM | POA: Diagnosis not present

## 2023-06-16 DIAGNOSIS — E7849 Other hyperlipidemia: Secondary | ICD-10-CM | POA: Diagnosis not present

## 2023-06-16 DIAGNOSIS — R0609 Other forms of dyspnea: Secondary | ICD-10-CM

## 2023-06-16 DIAGNOSIS — D509 Iron deficiency anemia, unspecified: Secondary | ICD-10-CM | POA: Diagnosis not present

## 2023-06-16 DIAGNOSIS — Z79891 Long term (current) use of opiate analgesic: Secondary | ICD-10-CM

## 2023-06-16 NOTE — Progress Notes (Unsigned)
Subjective:    Patient ID: [name], male    DOB: 1940/07/25, 83 y.o.   MRN: [id number]  Discussed the use of AI scribe software for clinical note transcription with the patient, who gave verbal consent to proceed. This patient was seen today for chronic pain  The medication list was reviewed and updated.   Location of Pain for which the patient has been treated with regarding narcotics: Back lumbar area previous surgery, failed control of pain  Onset of this pain: Present for years   -Compliance with medication: Good compliance with medicine  - Number patient states they take daily: 4/day  -Reason for ongoing use of opioids cannot take NSAIDs because of his medications Tylenol does not work well enough chronic low back pain despite having multiple surgeries  What other measures have been tried outside of opioids Tylenol, NSAIDs, surgery  In the ongoing specialists regarding this condition neurosurgery  -when was the last dose patient took?  Earlier today  The patient was advised the importance of maintaining medication and not using illegal substances with these.  Here for refills and follow up  The patient was educated that we can provide 3 monthly scripts for their medication, it is their responsibility to follow the instructions.  Side effects or complications from medications: Denies side effects  Patient is aware that pain medications are meant to minimize the severity of the pain to allow their pain levels to improve to allow for better function. They are aware of that pain medications cannot totally remove their pain.  Due for UDT ( at least once per year) (pain management contract is also completed at the time of the UDT): Does this on a yearly basis  Scale of 1 to 10 ( 1 is least 10 is most) Your pain level without the medicine: 9 Your pain level with medication 3  Scale 1 to 10 ( 1-helps very little, 10 helps very well) How well does your pain medication  reduce your pain so you can function better through out the day?  9  Quality of the pain: Throbbing aching  Persistence of the pain: Persist all the time worse with all  Modifying factors: Worse with activity      History of Present Illness   The patient is an 83 year old male who presents with dyspnea on exertion.  He experiences dyspnea on exertion, particularly when walking uphill for 50 to 100 feet, which leaves him 'totally out of breath' and necessitates sitting down. His breathing issues are intermittent, and he had a good night's sleep the previous night, despite waking up four or five times. At 8 o'clock, he experienced significant difficulty breathing. A heart ultrasound showed good heart function, and a CT scan earlier in the month did not reveal any lung clots.  He experiences urinary urgency, needing to urinate immediately to avoid incontinence. He manages this by staying close to a bathroom when out and often wears his shirt untucked to conceal any accidents. He is currently taking tamsulosin twice daily.  He has a persistent dry mouth, for which he keeps candy on his stand and uses it when his mouth gets very dry. He sometimes uses a humidifier, especially during the winter months.  He is compliant with his cholesterol and heart medications. He also takes pain medication, usually three or four times a day, and sometimes can go without it, but other times he cannot. He mentions having two pain pills left over from the previous  day.  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 No fever or chills.         Review of Systems     Objective:    Physical Exam   VITALS: BP 112/60 EXTREMITIES: No edema in lower legs     General-in no acute distress Eyes-no discharge Lungs-respiratory rate normal, CTA CV-no murmurs,RRR Extremities skin warm dry no edema Neuro grossly normal Behavior normal, alert  {Labs  (Optional):23779}     Assessment & Plan:  Assessment and Plan    Chronic Dry Mouth Persistent despite use of candy and occasional humidifier use. -Consider further evaluation for underlying causes such as Sjogren's syndrome or medication side effects.  Dyspnea on Exertion Noted particularly with uphill walking. Recent echocardiogram and CT scan showed normal heart function and no pulmonary embolism, respectively. Concern for possible coronary artery disease. -Plan to contact cardiology for further evaluation, possibly with a stress test or coronary angiogram.  Urinary Incontinence Frequent urgency and occasional accidents. Currently on Tamsulosin. -Reduce Tamsulosin to once daily, skipping the daytime dose.  Chronic Pain Management Currently on Oxycodone, with a plan to reduce to three pills daily or refer to pain management by midsummer. -Continue current regimen and monitor closely.  Follow-up in three months.     1. DOE (dyspnea on exertion) (Primary) ***  2. Paroxysmal atrial fibrillation (HCC) ***  3. Iron deficiency anemia, unspecified iron deficiency anemia type ***  4. Other hyperlipidemia ***  5. Encounter for long-term use of opiate analgesic ***

## 2023-06-17 ENCOUNTER — Ambulatory Visit: Payer: PPO | Admitting: Family Medicine

## 2023-06-17 ENCOUNTER — Other Ambulatory Visit (INDEPENDENT_AMBULATORY_CARE_PROVIDER_SITE_OTHER): Payer: Self-pay | Admitting: *Deleted

## 2023-06-17 DIAGNOSIS — D509 Iron deficiency anemia, unspecified: Secondary | ICD-10-CM

## 2023-06-17 DIAGNOSIS — K625 Hemorrhage of anus and rectum: Secondary | ICD-10-CM

## 2023-06-17 MED ORDER — OXYCODONE HCL 10 MG PO TABS: ORAL_TABLET | ORAL | 0 refills | Status: DC

## 2023-06-18 DIAGNOSIS — K625 Hemorrhage of anus and rectum: Secondary | ICD-10-CM | POA: Diagnosis not present

## 2023-06-18 DIAGNOSIS — D509 Iron deficiency anemia, unspecified: Secondary | ICD-10-CM | POA: Diagnosis not present

## 2023-06-19 LAB — CBC WITH DIFFERENTIAL/PLATELET
Basophils Absolute: 0.1 10*3/uL (ref 0.0–0.2)
Basos: 1 %
EOS (ABSOLUTE): 0.2 10*3/uL (ref 0.0–0.4)
Eos: 3 %
Hematocrit: 45.2 % (ref 37.5–51.0)
Hemoglobin: 14.3 g/dL (ref 13.0–17.7)
Immature Grans (Abs): 0 10*3/uL (ref 0.0–0.1)
Immature Granulocytes: 0 %
Lymphocytes Absolute: 1.9 10*3/uL (ref 0.7–3.1)
Lymphs: 31 %
MCH: 30.7 pg (ref 26.6–33.0)
MCHC: 31.6 g/dL (ref 31.5–35.7)
MCV: 97 fL (ref 79–97)
Monocytes Absolute: 0.9 10*3/uL (ref 0.1–0.9)
Monocytes: 14 %
Neutrophils Absolute: 3 10*3/uL (ref 1.4–7.0)
Neutrophils: 51 %
Platelets: 248 10*3/uL (ref 150–450)
RBC: 4.66 x10E6/uL (ref 4.14–5.80)
RDW: 13.3 % (ref 11.6–15.4)
WBC: 6 10*3/uL (ref 3.4–10.8)

## 2023-06-22 ENCOUNTER — Encounter (INDEPENDENT_AMBULATORY_CARE_PROVIDER_SITE_OTHER): Payer: Self-pay

## 2023-07-01 ENCOUNTER — Other Ambulatory Visit: Payer: Self-pay | Admitting: Family Medicine

## 2023-07-03 ENCOUNTER — Other Ambulatory Visit: Payer: Self-pay | Admitting: Internal Medicine

## 2023-07-09 ENCOUNTER — Ambulatory Visit: Payer: PPO | Attending: Student | Admitting: Student

## 2023-07-09 ENCOUNTER — Encounter: Payer: Self-pay | Admitting: Student

## 2023-07-09 VITALS — BP 112/60 | HR 74 | Ht 66.0 in | Wt 189.8 lb

## 2023-07-09 DIAGNOSIS — I4819 Other persistent atrial fibrillation: Secondary | ICD-10-CM

## 2023-07-09 DIAGNOSIS — Z86711 Personal history of pulmonary embolism: Secondary | ICD-10-CM

## 2023-07-09 DIAGNOSIS — I10 Essential (primary) hypertension: Secondary | ICD-10-CM

## 2023-07-09 DIAGNOSIS — I350 Nonrheumatic aortic (valve) stenosis: Secondary | ICD-10-CM | POA: Diagnosis not present

## 2023-07-09 DIAGNOSIS — R0609 Other forms of dyspnea: Secondary | ICD-10-CM | POA: Diagnosis not present

## 2023-07-09 MED ORDER — METOPROLOL TARTRATE 100 MG PO TABS: 100.0000 mg | ORAL_TABLET | ORAL | 0 refills | Status: DC

## 2023-07-09 NOTE — Progress Notes (Signed)
Cardiology Office Note    Date:  07/09/2023  ID:  [name], [name] [birth year], MRN [id number] Cardiologist: Marjo Bicker, MD    History of Present Illness:    [name] is a 83 y.o. male with past medical history of paroxysmal atrial fibrillation (prior ablation in 2011), prior PE/DVT, aortic stenosis, HTN and HLD who presents to the office today for follow-up of worsening dyspnea on exertion.  He was examined by Dr. Jenene Slicker in 07/2022 and reported shortness of breath for the past 3 months but symptoms had started to improve. Did note occasional palpitations but denied any chest pain. He was continued on Toprol-XL 25 mg twice daily along with Eliquis for anticoagulation. Was encouraged to use his Kardia monitor at home.  He did follow-up with his PCP in 05/2023 and reported worsening dyspnea despite recent echocardiogram and CT being reassuring. Was recommended to follow-up with Cardiology for further ischemic work-up. CTA in 05/2023 showed no evidence of a PE but was noted to have mild cardiomegaly and stable interstitial and groundglass opacities in the lung bases. Echocardiogram showed a preserved EF of 60 to 65% with no regional wall motion abnormalities, moderate LVH, low normal RV function, mild MR and mild AS.  In talking with the patient today, he reports having progressive dyspnea on exertion over the past 6 months to 1 year.  Notices this mostly when walking up inclines such as the hill in his backyard or up steps. Also notices this when walking for longer distances. Denies any associated chest pain or palpitations. No recent lower extremity edema but does report episodes of what sounds most consistent with PND. He takes Torsemide 10 mg but takes this at night as he prefers to avoid frequent urination during the day. Does report consuming a high sodium diet and has been unsuccessful with cutting back on sodium intake in the past. Remains on Eliquis for anticoagulation with  no reports of active bleeding.  Studies Reviewed:   EKG: EKG is ordered today and shows rate-controlled atrial fibrillation, HR 77 with TWI along the inferior leads (TWI noted on prior tracings).    NST: 01/2022   Lexiscan stress shows no EKG changes to suggest ischemia   Myoview scan shows thinning with decreased tracer activity in the inferior/ inferoseptal wall (base, mid) and apex  Similar in the stress and rest images   Consistent with probable soft tissue attenuation (diaphragm).  No ischemia or scar.   LVEF 58 % with normal wall motion   Overall low risk scan  Echocardiogram: 05/2022 IMPRESSIONS     1. Left ventricular ejection fraction, by estimation, is 60 to 65%. The  left ventricle has normal function. The left ventricle has no regional  wall motion abnormalities. There is moderate left ventricular hypertrophy.  Left ventricular diastolic function   could not be evaluated.   2. Right ventricular systolic function is low normal. The right  ventricular size is mildly enlarged. Tricuspid regurgitation signal is  inadequate for assessing PA pressure.   3. Left atrial size was severely dilated.   4. Right atrial size was severely dilated.   5. The mitral valve is normal in structure. Mild mitral valve  regurgitation. No evidence of mitral stenosis.   6. The aortic valve has an indeterminant number of cusps. There is  moderate calcification of the aortic valve. Aortic valve regurgitation is  trivial. Mild aortic valve stenosis. Aortic valve area, by VTI measures  1.96 cm. Aortic valve mean gradient  measures 11.0 mmHg. Aortic valve Vmax measures 2.05 m/s.   7. The inferior vena cava is normal in size with greater than 50%  respiratory variability, suggesting right atrial pressure of 3 mmHg.   Comparison(s): No significant change from prior study.   Risk Assessment/Calculations:    CHA2DS2-VASc Score = 3   This indicates a 3.2% annual risk of stroke. The patient's  score is based upon: CHF History: 0 HTN History: 1 Diabetes History: 0 Stroke History: 0 Vascular Disease History: 0 Age Score: 2 Gender Score: 0     Physical Exam:   VS:  BP 112/60 (BP Location: Left Arm, Cuff Size: Normal)   Pulse 74   Ht 5\' 6"  (1.676 m)   Wt 189 lb 12.8 oz (86.1 kg)   SpO2 95%   BMI 30.63 kg/m    Wt Readings from Last 3 Encounters:  07/09/23 189 lb 12.8 oz (86.1 kg)  06/16/23 187 lb (84.8 kg)  05/14/23 184 lb 11.9 oz (83.8 kg)     GEN: Well nourished, well developed male appearing in no acute distress NECK: No JVD; No carotid bruits CARDIAC: Irregularly irregular, 2/6 SEM along RUSB.  RESPIRATORY:  Clear to auscultation without rales, wheezing or rhonchi  ABDOMEN: Appears non-distended. No obvious abdominal masses. EXTREMITIES: No clubbing or cyanosis. No pitting edema.  Distal pedal pulses are 2+ bilaterally.   Assessment and Plan:   1. Dyspnea on Exertion - Unclear etiology but has been progressive over the past several months. Has been concerned about CAD.  Echocardiogram was reassuring as discussed above and CTA did show groundglass opacities in the lung bases but no PE. Reviewed options with the patient in regards to a repeat Lexiscan Myoview vs. Coronary CTA and he wishes to proceed with a Coronary CTA for further evaluation. - His BNP was also elevated at 321 when checked in 04/2023. He does take Torsemide 10 mg nightly due to frequent urination during the day. We reviewed titration of this to see if it helps with his symptoms but he declines at this time.  Also encouraged him to reduce his sodium intake.   Addendum: EKG today did confirm he was in atrial fibrillation. Reached out to Tanna Savoy, RN and will plan to obtain his Coronary CTA at [facility].   2. Paroxysmal Atrial Fibrillation - He did have a prior ablation in 2011 and EKG in 07/2022 showed normal sinus rhythm. EKG in 04/2023 showed significant artifact but overall appears irregular and  suspect he was atrial fibrillation at that time as well. EKG today confirms rate-controlled atrial fibrillation. We reviewed referral back to the [facility] but he prefers to follow-up here in [place] if able. Therefore will enter EP referral for here in [place] to discuss antiarrhythmic options as this could be contributing to his dyspnea on exertion. Remains on Toprol-XL 25 mg twice daily for now. - He is on Eliquis 5 mg twice daily for anticoagulation which is the appropriate dose at this time given his age, weight and renal function (creatinine was at 1.23 when checked in 04/2023).  3. History of PE/DVT - He has been on Eliquis for anticoagulation and recent CTA in 05/2023 was negative for PE.  4. Aortic Stenosis - This was mild by echocardiogram in 05/2022. Will continue to follow.  5.  HTN - Blood pressure is well-controlled at 112/60 during today's visit. Continue Amlodipine 10 mg daily, Toprol-XL 25 mg twice daily and Ramipril 10 mg twice daily.   Signed, Lennart Pall  Iran Ouch, PA-C

## 2023-07-09 NOTE — Patient Instructions (Signed)
Medication Instructions:   HOLD Toprol XL the morning of your CT Scan ( Take Lopressor instead)   Take 100 mg of Lopressor 2 hours prior to CT Scan   *If you need a refill on your cardiac medications before your next appointment, please call your pharmacy*   Lab Work: Your physician recommends that you return for lab work today. ( BMET)   If you have labs (blood work) drawn today and your tests are completely normal, you will receive your results only by: MyChart Message (if you have MyChart) OR A paper copy in the mail If you have any lab test that is abnormal or we need to change your treatment, we will call you to review the results.   Testing/Procedures:   Your cardiac CT will be scheduled at one of the below locations:   [facility] [address] [place], [place] [address] [phone]  OR  [facility] [address] [place], [place] [address] [phone]  If scheduled at [facility], please arrive at the [facility] and Children's Entrance (Entrance C2) of [facility] 30 minutes prior to test start time. You can use the FREE valet parking offered at entrance C (encouraged to control the heart rate for the test)  Proceed to the [facility] Radiology Department (first floor) to check-in and test prep.  All radiology patients and guests should use entrance C2 at [facility], accessed from [facility], even though the hospital's physical address listed is [address].    If scheduled at [facility] or [facility], please arrive 15 mins early for check-in and test prep.  There is spacious parking and easy access to the radiology department from the [facility] entrance. Please enter here and check-in with the desk attendant.   If scheduled at [facility], please arrive 30 minutes early for check-in and test prep.  Please follow  these instructions carefully (unless otherwise directed):  An IV will be required for this test and Nitroglycerin will be given.  Hold all erectile dysfunction medications at least 3 days (72 hrs) prior to test. (Ie viagra, cialis, sildenafil, tadalafil, etc)   On the Night Before the Test: Be sure to Drink plenty of water. Do not consume any caffeinated/decaffeinated beverages or chocolate 12 hours prior to your test. Do not take any antihistamines 12 hours prior to your test. If the patient has contrast allergy: Patient will need a prescription for Prednisone and very clear instructions (as follows): Prednisone 50 mg - take 13 hours prior to test Take another Prednisone 50 mg 7 hours prior to test Take another Prednisone 50 mg 1 hour prior to test Take Benadryl 50 mg 1 hour prior to test Patient must complete all four doses of above prophylactic medications. Patient will need a ride after test due to Benadryl.  On the Day of the Test: Drink plenty of water until 1 hour prior to the test. Do not eat any food 1 hour prior to test. You may take your regular medications prior to the test.  Take metoprolol (Lopressor) two hours prior to test. If you take Furosemide/Hydrochlorothiazide/Spironolactone/Chlorthalidone, please HOLD on the morning of the test. Patients who wear a continuous glucose monitor MUST remove the device prior to scanning. FEMALES- please wear underwire-free bra if available, avoid dresses & tight clothing  *For Clinical Staff only. Please instruct patient the following:* Heart Rate Medication Recommendations for  Cardiac CT  Resting HR < 50 bpm  No medication  Resting HR 50-60 bpm and BP >110/50 mmHG   Consider Metoprolol tartrate 25 mg PO 90-120 min prior to scan  Resting HR 60-65 bpm and BP >110/50 mmHG  Metoprolol tartrate 50 mg PO 90-120 minutes prior to scan   Resting HR > 65 bpm and BP >110/50 mmHG  Metoprolol tartrate 100 mg PO 90-120 minutes prior to scan   Consider Ivabradine 10-15 mg PO or a calcium channel blocker for resting HR >60 bpm and contraindication to metoprolol tartrate  Consider Ivabradine 10-15 mg PO in combination with metoprolol tartrate for HR >80 bpm        After the Test: Drink plenty of water. After receiving IV contrast, you may experience a mild flushed feeling. This is normal. On occasion, you may experience a mild rash up to 24 hours after the test. This is not dangerous. If this occurs, you can take Benadryl 25 mg, Zyrtec, Claritin, or Allegra and increase your fluid intake. (Patients taking Tikosyn should avoid Benadryl, and may take Zyrtec, Claritin, or Allegra) If you experience trouble breathing, this can be serious. If it is severe call 911 IMMEDIATELY. If it is mild, please call our office.  We will call to schedule your test 2-4 weeks out understanding that some insurance companies will need an authorization prior to the service being performed.   For more information and frequently asked questions, please visit our website : [email or web address]  For non-scheduling related questions, please contact the cardiac imaging nurse navigator should you have any questions/concerns: Cardiac Imaging Nurse Navigators Direct Office Dial: [phone]   For scheduling needs, including cancellations and rescheduling, please call [place], [phone].    Follow-Up: At [facility], you and your health needs are our priority.  As part of our continuing mission to provide you with exceptional heart care, we have created designated Provider Care Teams.  These Care Teams include your primary Cardiologist (physician) and Advanced Practice Providers (APPs -  Physician Assistants and Nurse Practitioners) who all work together to provide you with the care you need, when you need it.  We recommend signing up for the patient portal called "MyChart".  Sign up information is provided on this After Visit Summary.   MyChart is used to connect with patients for Virtual Visits (Telemedicine).  Patients are able to view lab/test results, encounter notes, upcoming appointments, etc.  Non-urgent messages can be sent to your provider as well.   To learn more about what you can do with MyChart, go to [email or web address].    Your next appointment:   3 -4 month(s)  Provider:   You may see Vishnu P Mallipeddi, MD or one of the following Advanced Practice Providers on your designated Care Team:   Randall An, PA-C  Jacolyn Reedy, PA-C     Other Instructions Thank you for choosing [facility]!

## 2023-07-12 ENCOUNTER — Ambulatory Visit (INDEPENDENT_AMBULATORY_CARE_PROVIDER_SITE_OTHER): Payer: PPO | Admitting: Gastroenterology

## 2023-07-12 ENCOUNTER — Encounter (INDEPENDENT_AMBULATORY_CARE_PROVIDER_SITE_OTHER): Payer: Self-pay | Admitting: Gastroenterology

## 2023-07-12 VITALS — BP 116/75 | HR 82 | Temp 97.5°F | Ht 66.0 in | Wt 187.8 lb

## 2023-07-12 DIAGNOSIS — R682 Dry mouth, unspecified: Secondary | ICD-10-CM

## 2023-07-12 MED ORDER — NYSTATIN 100000 UNIT/ML MT SUSP: 5.0000 mL | Freq: Three times a day (TID) | OROMUCOSAL | 0 refills | Status: AC

## 2023-07-12 NOTE — Progress Notes (Signed)
 Referring Provider: Babs Sciara, MD Primary Care Physician:  Babs Sciara, MD Primary GI Physician: Dr. Levon Hedger   Chief Complaint  Patient presents with   Follow-up    Pt arrives for follow up. Pt has dry mouth mostly at night.    HPI:   [name] is a 83 y.o. male with past medical history of arthritis, skin cancer, carpal tunnel syndrome, chronic back pain, DVT, GERD, glaucoma, HTN, HLD    Patient presenting today for follow up of rectal bleeding, dry mouth   Patient last seen December 2024, at that time, reports he woke up with some rectal bleeding, abdominal cramping the day prior, proceeded to the ED for further evaluation where hemoglobin was 14.1, INR 1.3, case was discussed with Dr. Marletta Lor who advised patient hold Eliquis for the next few days and repeat CBC in 1 week.  Patient also reported some issues since drinking hot coffee in November with sore throat and dry mouth.  Taking Protonix 40 mg 1-2 times per day.  Had some weight loss since August.  Doing Carafate given to him by PCP but not seeing much improvement.  Patient recommended to hold Eliquis x 48 hours, repeat CBC 1 week, schedule EGD, Magic mouthwash 3 times daily x 7 to 10 days, ER precautions  Patient underwent upper endoscopy 06/13/2022 which showed no gross lesions in the entire esophagus, granular mucosa in the stomach which was biopsied, multiple gastric polyps, normal duodenal bulb, second portion of the duodenum and third portion of the duodenum, biopsies taken with cold forceps for evaluation of eosinophilic esophagitis  Biopsies from EGD with benign fundic gland polyps in the stomach, negative H. pylori, esophageal biopsies with reflux changes.  Last labs done 06/18/2023 with hemoglobin 14.3  Present: He is unsure if he did the magic mouthwash prescribed at last visit. He does not recall taking this. He reports that his mouth remains dry and he has to sip on water and suck on hard candy. He  reports that he often has to sit up and sleep as dry mouth is worse when he lays down and sitting up to sleep improves his symptoms. Denies any pain or burning in his mouth.  Feels GERD is well controlled on protonix 40mg  BID.  No dysphagia or odynophagia.  Denies rectal bleeding, abdominal pain, melena. Weight has been stable.   States he saw cardiology on 2/21 and was found to have a fib with controlled rate. He notes he did not have any symptoms of his heart feeling out of rhythm as he has been able to tin the past. [place] to go to [place].    Last Colonoscopy:Last Colonoscopy:12/2022 - The examined portion of the ileum was normal.                           - Five 4 to 8 mm polyps in the transverse colon and                            in the ascending colon, removed with a cold snare.                            Resected and retrieved.                           -  Diverticulosis in the sigmoid colon and in the                            descending colon.                           - Non-bleeding internal hemorrhoids. (If recurrent small bowel obstruction may consider outpatient evaluation by general surgery)  Last Endoscopy: 05/14/2023, as outlined above  Recommendations:    Past Medical History:  Diagnosis Date   Arthritis    Cancer (HCC)    skin   Carpal tunnel syndrome    Cataract    left eye   Chronic back pain    multiple back surgeries   Complication of anesthesia    pt states woke up during hemorroid surgery and stopped breathing during his back surgery   DVT (deep venous thrombosis) (HCC) 03/2007   left leg    Dysrhythmia    GERD (gastroesophageal reflux disease)    takes Protonix daily   Glaucoma    History of colon polyps    History of hyperglycemia    History of MRSA infection 1992   HTN (hypertension)    takes  Ramipril daily   Hyperlipidemia    takes Pravastatin 3 times a week   Nocturia    Paroxysmal atrial fibrillation (HCC)     takes Pradaxa daily, Chads2vasc score 2(07/11/14)   Pneumonia    many yrs ago   Pulmonary embolism (HCC) 2008    Past Surgical History:  Procedure Laterality Date   APPLICATION OF ROBOTIC ASSISTANCE FOR SPINAL PROCEDURE  05/06/2021   Procedure: APPLICATION OF ROBOTIC ASSISTANCE FOR SPINAL PROCEDURE;  Surgeon: Barnett Abu, MD;  Location: [facility] OR;  Service: Neurosurgery;;   BACK SURGERY     BIOPSY  04/01/2018   Procedure: BIOPSY;  Surgeon: Malissa Hippo, MD;  Location: [facility];  Service: Endoscopy;;  gastric   BIOPSY  05/14/2023   Procedure: BIOPSY;  Surgeon: Franky Macho, MD;  [facility];  Service: Endoscopy;;   CARDIAC CATHETERIZATION  05/18/2006   CARPAL TUNNEL RELEASE Right    cataract surgery Right    cervical and lumbar surgery     COLONOSCOPY  04/24/2011   Procedure: COLONOSCOPY;  Surgeon: Malissa Hippo, MD;  [facility];  Service: Endoscopy;  Laterality: N/A;  1200   COLONOSCOPY N/A 08/13/2016   Procedure: COLONOSCOPY;  Surgeon: Malissa Hippo, MD;  [facility];  Service: Endoscopy;  Laterality: N/A;  1200   COLONOSCOPY WITH PROPOFOL N/A 12/22/2022   Procedure: COLONOSCOPY WITH PROPOFOL;  Surgeon: Dolores Frame, MD;  [facility];  Service: Gastroenterology;  Laterality: N/A;  11:15am;asa 3   ESOPHAGOGASTRODUODENOSCOPY  03/10/2012   Procedure: ESOPHAGOGASTRODUODENOSCOPY (EGD);  Surgeon: Malissa Hippo, MD;  [facility];  Service: Endoscopy;  Laterality: N/A;  325   ESOPHAGOGASTRODUODENOSCOPY (EGD) WITH PROPOFOL N/A 04/01/2018   Procedure: ESOPHAGOGASTRODUODENOSCOPY (EGD) WITH PROPOFOL;  Surgeon: Malissa Hippo, MD;  [facility];  Service: Endoscopy;  Laterality: N/A;   ESOPHAGOGASTRODUODENOSCOPY (EGD) WITH PROPOFOL N/A 05/14/2023   Procedure: ESOPHAGOGASTRODUODENOSCOPY (EGD) WITH PROPOFOL;  Surgeon: Franky Macho, MD;  [facility];  Service: Endoscopy;  Laterality:  N/A;  8:00am;asa 3   EYE SURGERY     HARDWARE REMOVAL N/A 04/24/2014   Procedure: REMOVAL OF SACRAL INSTRUMENTATION WITH METREX;  Surgeon:  Barnett Abu, MD;  Location: [facility];  Service: Neurosurgery;  Laterality: N/A;   HEMORRHOIDECTOMY WITH HEMORRHOID BANDING     JOINT REPLACEMENT     KNEE ARTHROSCOPY Right    KNEE ARTHROSCOPY WITH MEDIAL MENISECTOMY Right 08/03/2018   Procedure: RIGHT KNEE ARTHROSCOPY WITH PARTIAL MEDIAL MENISCECTOMY;  Surgeon: Tarry Kos, MD;  Location: [facility];  Service: Orthopedics;  Laterality: Right;   KNEE SURGERY     left   LASIK     POLYPECTOMY  08/13/2016   Procedure: POLYPECTOMY;  Surgeon: Malissa Hippo, MD;  [facility];  Service: Endoscopy;;  colon   POLYPECTOMY  12/22/2022   Procedure: POLYPECTOMY;  Surgeon: Dolores Frame, MD;  [facility];  Service: Gastroenterology;;   POLYPECTOMY  05/14/2023   Procedure: POLYPECTOMY;  Surgeon: Franky Macho, MD;  [facility];  Service: Endoscopy;;   PVI  01/24/2010   afib ablation   TRANSFORAMINAL LUMBAR INTERBODY FUSION (TLIF) WITH PEDICLE SCREW FIXATION 1 LEVEL N/A 05/06/2021   Procedure: Transforaminal Lumbar Interbody Fusion  - Thoracic twelve-Lumbar one fixation Thoracic nine to Lumbar one with methacrylate augmentation with mazor;  Surgeon: Barnett Abu, MD;  Location: [facility] OR;  Service: Neurosurgery;  Laterality: N/A;    Current Outpatient Medications  Medication Sig Dispense Refill   amLODipine (NORVASC) 10 MG tablet TAKE 1 TABLET BY MOUTH EVERY DAY 90 tablet 1   [Paused] ELIQUIS 5 MG TABS tablet TAKE 1 TABLET BY MOUTH TWICE A DAY. STOP PRADAXA (Patient taking differently: 5 mg.) 60 tablet 5   indapamide (LOZOL) 1.25 MG tablet TAKE 1 TABLET BY MOUTH DAILY. 90 tablet 1   latanoprost (XALATAN) 0.005 % ophthalmic solution Place 1 drop into both eyes at bedtime.     metoprolol succinate (TOPROL-XL) 25 MG 24 hr tablet TAKE 1 TABLET BY  MOUTH TWICE A DAY 180 tablet 2   metoprolol tartrate (LOPRESSOR) 100 MG tablet Take 1 tablet (100 mg total) by mouth as directed. Take 2 Hours Prior to Cardiac CT Scan ( Do not take Toprol XL that Morning) 1 tablet 0   Oxycodone HCl 10 MG TABS 1 taken 4 times daily as needed pain 120 tablet 0   Oxycodone HCl 10 MG TABS 1 taken 4 times daily as needed for pain 120 tablet 0   Oxycodone HCl 10 MG TABS 1 tablet taken 3 or 4 times daily as needed for pain caution drowsiness 120 tablet 0   pantoprazole (PROTONIX) 40 MG tablet Take 1 tablet (40 mg total) by mouth 2 (two) times daily. 180 tablet 1   pravastatin (PRAVACHOL) 80 MG tablet TAKE 1 TABLET BY MOUTH EVERYDAY AT BEDTIME 90 tablet 1   psyllium (METAMUCIL SMOOTH TEXTURE) 58.6 % powder Take 1 packet by mouth at bedtime.     ramipril (ALTACE) 10 MG capsule TAKE 1 CAPSULE BY MOUTH 2 TIMES DAILY. 180 capsule 1   sucralfate (CARAFATE) 1 g tablet CRUSH MIX IN WATER USE THREE TIMES DAY FOR 30 DAYS 270 tablet 1   tamsulosin (FLOMAX) 0.4 MG CAPS capsule Take 1 capsule (0.4 mg total) by mouth 2 (two) times daily. 180 capsule 3   torsemide (DEMADEX) 10 MG tablet Take 1 tablet (10 mg total) by mouth daily. 30 tablet 5   XDEMVY 0.25 % SOLN Apply to eye.     No current facility-administered medications for this visit.    Allergies as of 07/12/2023 - Review Complete 07/12/2023  Allergen Reaction Noted  Sectral [acebutolol hcl] Other (See Comments) 09/14/2012   Sulfonamide derivatives Other (See Comments)    Zocor [simvastatin] Other (See Comments) 09/14/2012    Family History  Problem Relation Age of Onset   Hypertension Mother    Transient ischemic attack Mother    Heart disease Father    Diabetes Brother    Healthy Daughter    Colon cancer Sister    Hypertension Other    Heart disease Other     Social History   Socioeconomic History   Marital status: Married    Spouse name: Not on file   Number of children: Not on file   Years of  education: Not on file   Highest education level: Not on file  Occupational History   Occupation: retired  Tobacco Use   Smoking status: Former    Current packs/day: 0.00    Average packs/day: 0.3 packs/day for 3.0 years (0.8 ttl pk-yrs)    Types: Cigarettes    Start date: 03/30/1966    Quit date: 03/30/1969    Years since quitting: 54.3    Passive exposure: Past   Smokeless tobacco: Never  Vaping Use   Vaping status: Never Used  Substance and Sexual Activity   Alcohol use: Not Currently    Alcohol/week: 14.0 standard drinks of alcohol    Types: 7 Glasses of wine, 7 Shots of liquor per week    Comment: Jack nightly   Drug use: No   Sexual activity: Yes  Other Topics Concern   Not on file  Social History Narrative   Not on file   Social Drivers of Health   Financial Resource Strain: Low Risk  (11/12/2020)   Overall Financial Resource Strain (CARDIA)    Difficulty of Paying Living Expenses: Not hard at all  Food Insecurity: No Food Insecurity (10/26/2022)   Hunger Vital Sign    Worried About Running Out of Food in the Last Year: Never true    Ran Out of Food in the Last Year: Never true  Transportation Needs: No Transportation Needs (10/26/2022)   PRAPARE - Administrator, Civil Service (Medical): No    Lack of Transportation (Non-Medical): No  Physical Activity: Sufficiently Active (11/25/2021)   Exercise Vital Sign    Days of Exercise per Week: 5 days    Minutes of Exercise per Session: 30 min  Stress: No Stress Concern Present (11/25/2021)   [employer] of Occupational Health - Occupational Stress Questionnaire    Feeling of Stress : Not at all  Social Connections: Socially Integrated (11/25/2021)   Social Connection and Isolation Panel [NHANES]    Frequency of Communication with Friends and Family: More than three times a week    Frequency of Social Gatherings with Friends and Family: More than three times a week    Attends Religious Services: More  than 4 times per year    Active Member of [employer] or Organizations: Yes    Attends Engineer, structural: More than 4 times per year    Marital Status: Married    Review of systems General: negative for malaise, night sweats, fever, chills, weight loss Neck: Negative for lumps, goiter, pain and significant neck swelling HEENT: dry mouth  Resp: Negative for cough, wheezing, dyspnea at rest CV: Negative for chest pain, leg swelling, palpitations, orthopnea GI: denies melena, hematochezia, nausea, vomiting, diarrhea, constipation, dysphagia, odyonophagia, early satiety or unintentional weight loss.  The remainder of the review of systems is noncontributory.  Physical Exam: BP  116/75   Pulse 82   Temp (!) 97.5 F (36.4 C)   Ht 5\' 6"  (1.676 m)   Wt 187 lb 12.8 oz (85.2 kg)   BMI 30.31 kg/m  General:   Alert and oriented. No distress noted. Pleasant and cooperative.  Head:  Normocephalic and atraumatic. Eyes:  Conjuctiva clear without scleral icterus. Mouth:  Oral mucosa pink and moist. White patches noted on tongue  Heart: Normal rate and rhythm, s1 and s2 heart sounds present.  Lungs: Clear lung sounds in all lobes. Respirations equal and unlabored. Abdomen:  +BS, soft, non-tender and non-distended. No rebound or guarding. No HSM or masses noted. Neurologic:  Alert and  oriented x4 Psych:  Alert and cooperative. Normal mood and affect.  Invalid input(s): "6 MONTHS"   ASSESSMENT: [name] is a 83 y.o. male presenting today for follow up of rectal bleeding and dry mouth   Patient with previous mild rectal bleeding that spontaneously resolved.  Thought to be possibly a diverticular bleed.  Last hemoglobin stable and he has had no further rectal bleeding or melena.  He should let me know if he has any recurrent symptoms  He continues to endorse dry mouth since drinking scalding hot coffee in November.  EGD as outlined above without findings to explain his symptoms.  He is  unsure if he obtained the Magic mouthwash that was sent to him at his last visit.  Of note he does have some white patches on his tongue in office today, could be some yeast contributing to his symptoms though I also wonder if he is sleeping with his mouth open as he notes his symptoms are worse at night especially when laying down.  For now he can continue with sips of water, sugar-free hard candy and will send Magic mouthwash again for him to swish and swallow.  He should make me aware if symptoms or not improving    PLAN:  -Magic mouthwash 3 times daily x 10 days -Continue PPI twice daily -Patient to let me know if dry mouth does not improve with Magic mouthwash course -Continue to sip on water and suck on sugar-free hard candy to help with symptoms of dry mouth.  All questions were answered, patient verbalized understanding and is in agreement with plan as outlined above.    Follow Up: 3 to 4 months  Chance Munter L. Jeanmarie Hubert, MSN, APRN, AGNP-C Adult-Gerontology Nurse Practitioner [facility]  I have reviewed the note and agree with the APP's assessment as described in this progress note  Katrinka Blazing, MD Gastroenterology and Hepatology [facility] Gastroenterology

## 2023-07-12 NOTE — Patient Instructions (Signed)
 Please continue pantoprazole 40 mg twice daily I will resend the Magic mouthwash for you to use 3 times a day for 10 days to see if this helps with your dry mouth, please continue to do sips of water as well as sugar-free hard candy to help with symptoms as well.  Please let me know if this is not improving  Follow-up 3 to 4 months

## 2023-07-13 DIAGNOSIS — R918 Other nonspecific abnormal finding of lung field: Secondary | ICD-10-CM | POA: Diagnosis not present

## 2023-07-13 DIAGNOSIS — Z7901 Long term (current) use of anticoagulants: Secondary | ICD-10-CM | POA: Diagnosis not present

## 2023-07-13 DIAGNOSIS — Z85828 Personal history of other malignant neoplasm of skin: Secondary | ICD-10-CM | POA: Diagnosis not present

## 2023-07-13 DIAGNOSIS — I10 Essential (primary) hypertension: Secondary | ICD-10-CM | POA: Diagnosis not present

## 2023-07-13 DIAGNOSIS — R079 Chest pain, unspecified: Secondary | ICD-10-CM | POA: Diagnosis not present

## 2023-07-13 DIAGNOSIS — H5789 Other specified disorders of eye and adnexa: Secondary | ICD-10-CM | POA: Insufficient documentation

## 2023-07-13 DIAGNOSIS — I700 Atherosclerosis of aorta: Secondary | ICD-10-CM | POA: Diagnosis not present

## 2023-07-13 DIAGNOSIS — Z87891 Personal history of nicotine dependence: Secondary | ICD-10-CM | POA: Insufficient documentation

## 2023-07-13 DIAGNOSIS — I771 Stricture of artery: Secondary | ICD-10-CM | POA: Diagnosis not present

## 2023-07-13 DIAGNOSIS — R0789 Other chest pain: Secondary | ICD-10-CM | POA: Diagnosis not present

## 2023-07-13 DIAGNOSIS — R0609 Other forms of dyspnea: Secondary | ICD-10-CM | POA: Diagnosis not present

## 2023-07-13 DIAGNOSIS — R591 Generalized enlarged lymph nodes: Secondary | ICD-10-CM | POA: Diagnosis not present

## 2023-07-13 DIAGNOSIS — R002 Palpitations: Secondary | ICD-10-CM | POA: Diagnosis not present

## 2023-07-13 DIAGNOSIS — R9389 Abnormal findings on diagnostic imaging of other specified body structures: Secondary | ICD-10-CM | POA: Diagnosis not present

## 2023-07-13 DIAGNOSIS — I517 Cardiomegaly: Secondary | ICD-10-CM | POA: Diagnosis not present

## 2023-07-14 ENCOUNTER — Emergency Department (HOSPITAL_COMMUNITY): Payer: PPO

## 2023-07-14 ENCOUNTER — Other Ambulatory Visit: Payer: Self-pay

## 2023-07-14 ENCOUNTER — Encounter (HOSPITAL_COMMUNITY): Payer: Self-pay | Admitting: Emergency Medicine

## 2023-07-14 ENCOUNTER — Telehealth: Payer: Self-pay

## 2023-07-14 ENCOUNTER — Emergency Department (HOSPITAL_COMMUNITY)
Admission: EM | Admit: 2023-07-14 | Discharge: 2023-07-14 | Disposition: A | Discharge status: Home / Self Care | Payer: PPO | Attending: Emergency Medicine | Admitting: Emergency Medicine

## 2023-07-14 DIAGNOSIS — R079 Chest pain, unspecified: Secondary | ICD-10-CM | POA: Diagnosis not present

## 2023-07-14 DIAGNOSIS — R591 Generalized enlarged lymph nodes: Secondary | ICD-10-CM | POA: Diagnosis not present

## 2023-07-14 DIAGNOSIS — R0609 Other forms of dyspnea: Secondary | ICD-10-CM

## 2023-07-14 DIAGNOSIS — I700 Atherosclerosis of aorta: Secondary | ICD-10-CM | POA: Diagnosis not present

## 2023-07-14 DIAGNOSIS — I517 Cardiomegaly: Secondary | ICD-10-CM | POA: Diagnosis not present

## 2023-07-14 DIAGNOSIS — I771 Stricture of artery: Secondary | ICD-10-CM | POA: Diagnosis not present

## 2023-07-14 DIAGNOSIS — R002 Palpitations: Secondary | ICD-10-CM

## 2023-07-14 DIAGNOSIS — R918 Other nonspecific abnormal finding of lung field: Secondary | ICD-10-CM | POA: Diagnosis not present

## 2023-07-14 DIAGNOSIS — R9389 Abnormal findings on diagnostic imaging of other specified body structures: Secondary | ICD-10-CM | POA: Diagnosis not present

## 2023-07-14 LAB — BASIC METABOLIC PANEL
BUN/Creatinine Ratio: 23 (ref 10–24)
BUN: 27 mg/dL (ref 8–27)
CO2: 23 mmol/L (ref 20–29)
Calcium: 9.5 mg/dL (ref 8.6–10.2)
Chloride: 103 mmol/L (ref 96–106)
Creatinine, Ser: 1.19 mg/dL (ref 0.76–1.27)
Glucose: 191 mg/dL — ABNORMAL HIGH (ref 70–99)
Potassium: 4.3 mmol/L (ref 3.5–5.2)
Sodium: 143 mmol/L (ref 134–144)
eGFR: 61 mL/min/{1.73_m2} (ref >59)

## 2023-07-14 LAB — COMPREHENSIVE METABOLIC PANEL
ALT: 34 U/L (ref 0–44)
AST: 33 U/L (ref 15–41)
Albumin: 4.7 g/dL (ref 3.5–5.0)
Alkaline Phosphatase: 76 U/L (ref 38–126)
Anion gap: 13 (ref 5–15)
BUN: 28 mg/dL — ABNORMAL HIGH (ref 8–23)
CO2: 24 mmol/L (ref 22–32)
Calcium: 9.7 mg/dL (ref 8.9–10.3)
Chloride: 101 mmol/L (ref 98–111)
Creatinine, Ser: 0.98 mg/dL (ref 0.61–1.24)
GFR, Estimated: >60 mL/min (ref >60)
Glucose, Bld: 145 mg/dL — ABNORMAL HIGH (ref 70–99)
Potassium: 3.7 mmol/L (ref 3.5–5.1)
Sodium: 138 mmol/L (ref 135–145)
Total Bilirubin: 0.8 mg/dL (ref 0.0–1.2)
Total Protein: 7.9 g/dL (ref 6.5–8.1)

## 2023-07-14 LAB — CBC
HCT: 43.9 % (ref 39.0–52.0)
Hemoglobin: 14.6 g/dL (ref 13.0–17.0)
MCH: 31.1 pg (ref 26.0–34.0)
MCHC: 33.3 g/dL (ref 30.0–36.0)
MCV: 93.4 fL (ref 80.0–100.0)
Platelets: 216 10*3/uL (ref 150–400)
RBC: 4.7 MIL/uL (ref 4.22–5.81)
RDW: 12.8 % (ref 11.5–15.5)
WBC: 5.7 10*3/uL (ref 4.0–10.5)
nRBC: 0 % (ref 0.0–0.2)

## 2023-07-14 LAB — TROPONIN I (HIGH SENSITIVITY)
Troponin I (High Sensitivity): 20 ng/L — ABNORMAL HIGH (ref <18)
Troponin I (High Sensitivity): 20 ng/L — ABNORMAL HIGH (ref <18)

## 2023-07-14 LAB — D-DIMER, QUANTITATIVE: D-Dimer, Quant: 0.55 ug{FEU}/mL — ABNORMAL HIGH (ref 0.00–0.50)

## 2023-07-14 MED ORDER — IOHEXOL 350 MG/ML SOLN
75.0000 mL | Freq: Once | INTRAVENOUS | Status: AC | PRN
  Administered 2023-07-14: 75 mL via INTRAVENOUS

## 2023-07-14 NOTE — ED Triage Notes (Signed)
 Pt c/o bilateral eye burning, states his heart is out of rhythm and a blood clot moving around his chest.

## 2023-07-14 NOTE — Discharge Instructions (Signed)
 You were evaluated in the Emergency Department and after careful evaluation, we did not find any emergent condition requiring admission or further testing in the hospital.  Your exam/testing today is overall reassuring.  Recommend close follow-up with your regular doctors and cardiologist to discuss her symptoms.  Continue your home medications.  Please return to the Emergency Department if you experience any worsening of your condition.   Thank you for allowing [place] to be a part of your care.

## 2023-07-14 NOTE — Telephone Encounter (Signed)
 The patient has been notified of the result and verbalized understanding.  All questions (if any) were answered. Nori Riis, RN 07/14/2023 9:28 AM     Patient would like referral placed to pulmonary, pcp copied

## 2023-07-14 NOTE — Telephone Encounter (Signed)
-----   Message from Laurann Montana sent at 07/14/2023  7:54 AM EST ----- Covering for B Strader's inbox today. BMET was obtained in preparation for coronary CTA - let him know this looked stable except blood sugar elevated. However in the interim looks like he was seen in ED with multiple complaints and concern for blood clot in his lung. CTA thankfully negative for PE. hsTroponin level low/flat at 20-20. ED provider felt workup was reassuring. He has been in persistent atrial fibrillation recently which may make the plan for coronary CTA challenging but it looks like [place] already had reached out to [place] at [place] regarding this - this is not scheduled until end of March. I will leave this message in Brittany's inbox for her review to ensure the presence of afib doesn't change plan.  His recent CT had shown some small airways disease, air trapping, enlarged lymph nodes, and lung base opacities/shadows. Would offer pulmonary referral for further evaluation of this under diagnosis dyspnea on exertion. Thank you!

## 2023-07-14 NOTE — ED Provider Notes (Signed)
 [facility] Emergency Department Provider Note MRN:  [id number]  Arrival date & time: 07/14/23     Chief Complaint   Palpitations   History of Present Illness   [name] is a 83 y.o. year-old male with a history of A-fib, PE presenting to the ED with chief complaint of palpitations.  Patient is here with multiple complaints.  Felt like his eyes were burning earlier but then they got better.  Also feels like his heart is beating out of rhythm.  Has a known history of A-fib.  Also feels pretty confidently that he has a blood clot in his lungs.  Felt the blood clot start in the left upper quadrant area and feels it moving into the left upper chest.  Has had a blood clot before.  Denies shortness of breath, no leg pain or swelling.  Compliant with home medications though did not take his morning dose this morning.  Review of Systems  A thorough review of systems was obtained and all systems are negative except as noted in the HPI and PMH.   Patient's Health History    Past Medical History:  Diagnosis Date   Arthritis    Cancer (HCC)    skin   Carpal tunnel syndrome    Cataract    left eye   Chronic back pain    multiple back surgeries   Complication of anesthesia    pt states woke up during hemorroid surgery and stopped breathing during his back surgery   DVT (deep venous thrombosis) (HCC) 03/2007   left leg    Dysrhythmia    GERD (gastroesophageal reflux disease)    takes Protonix daily   Glaucoma    History of colon polyps    History of hyperglycemia    History of MRSA infection 1992   HTN (hypertension)    takes  Ramipril daily   Hyperlipidemia    takes Pravastatin 3 times a week   Nocturia    Paroxysmal atrial fibrillation (HCC)    takes Pradaxa daily, Chads2vasc score 2(07/11/14)   Pneumonia    many yrs ago   Pulmonary embolism (HCC) 2008    Past Surgical History:  Procedure Laterality Date   APPLICATION OF ROBOTIC ASSISTANCE FOR  SPINAL PROCEDURE  05/06/2021   Procedure: APPLICATION OF ROBOTIC ASSISTANCE FOR SPINAL PROCEDURE;  Surgeon: Barnett Abu, MD;  Location: [facility] OR;  Service: Neurosurgery;;   BACK SURGERY     BIOPSY  04/01/2018   Procedure: BIOPSY;  Surgeon: Malissa Hippo, MD;  Location: [facility];  Service: Endoscopy;;  gastric   BIOPSY  05/14/2023   Procedure: BIOPSY;  Surgeon: Franky Macho, MD;  [facility];  Service: Endoscopy;;   CARDIAC CATHETERIZATION  05/18/2006   CARPAL TUNNEL RELEASE Right    cataract surgery Right    cervical and lumbar surgery     COLONOSCOPY  04/24/2011   Procedure: COLONOSCOPY;  Surgeon: Malissa Hippo, MD;  [facility];  Service: Endoscopy;  Laterality: N/A;  1200   COLONOSCOPY N/A 08/13/2016   Procedure: COLONOSCOPY;  Surgeon: Malissa Hippo, MD;  [facility];  Service: Endoscopy;  Laterality: N/A;  1200   COLONOSCOPY WITH PROPOFOL N/A 12/22/2022   Procedure: COLONOSCOPY WITH PROPOFOL;  Surgeon: Dolores Frame, MD;  [facility];  Service: Gastroenterology;  Laterality: N/A;  11:15am;asa 3   ESOPHAGOGASTRODUODENOSCOPY  03/10/2012   Procedure: ESOPHAGOGASTRODUODENOSCOPY (EGD);  Surgeon: Malissa Hippo, MD;  [facility];  Service: Endoscopy;  Laterality: N/A;  325   ESOPHAGOGASTRODUODENOSCOPY (EGD) WITH PROPOFOL N/A 04/01/2018   Procedure: ESOPHAGOGASTRODUODENOSCOPY (EGD) WITH PROPOFOL;  Surgeon: Malissa Hippo, MD;  [facility];  Service: Endoscopy;  Laterality: N/A;   ESOPHAGOGASTRODUODENOSCOPY (EGD) WITH PROPOFOL N/A 05/14/2023   Procedure: ESOPHAGOGASTRODUODENOSCOPY (EGD) WITH PROPOFOL;  Surgeon: Franky Macho, MD;  [facility];  Service: Endoscopy;  Laterality: N/A;  8:00am;asa 3   EYE SURGERY     HARDWARE REMOVAL N/A 04/24/2014   Procedure: REMOVAL OF SACRAL INSTRUMENTATION WITH METREX;  Surgeon: Barnett Abu, MD;  [facility]: [facility];  Service: Neurosurgery;   Laterality: N/A;   HEMORRHOIDECTOMY WITH HEMORRHOID BANDING     JOINT REPLACEMENT     KNEE ARTHROSCOPY Right    KNEE ARTHROSCOPY WITH MEDIAL MENISECTOMY Right 08/03/2018   Procedure: RIGHT KNEE ARTHROSCOPY WITH PARTIAL MEDIAL MENISCECTOMY;  Surgeon: Tarry Kos, MD;  Location: [facility];  Service: Orthopedics;  Laterality: Right;   KNEE SURGERY     left   LASIK     POLYPECTOMY  08/13/2016   Procedure: POLYPECTOMY;  Surgeon: Malissa Hippo, MD;  [facility];  Service: Endoscopy;;  colon   POLYPECTOMY  12/22/2022   Procedure: POLYPECTOMY;  Surgeon: Dolores Frame, MD;  [facility];  Service: Gastroenterology;;   POLYPECTOMY  05/14/2023   Procedure: POLYPECTOMY;  Surgeon: Franky Macho, MD;  [facility];  Service: Endoscopy;;   PVI  01/24/2010   afib ablation   TRANSFORAMINAL LUMBAR INTERBODY FUSION (TLIF) WITH PEDICLE SCREW FIXATION 1 LEVEL N/A 05/06/2021   Procedure: Transforaminal Lumbar Interbody Fusion  - Thoracic twelve-Lumbar one fixation Thoracic nine to Lumbar one with methacrylate augmentation with mazor;  Surgeon: Barnett Abu, MD;  Location: [facility];  Service: Neurosurgery;  Laterality: N/A;    Family History  Problem Relation Age of Onset   Hypertension Mother    Transient ischemic attack Mother    Heart disease Father    Diabetes Brother    Healthy Daughter    Colon cancer Sister    Hypertension Other    Heart disease Other     Social History   Socioeconomic History   Marital status: Married    Spouse name: Not on file   Number of children: Not on file   Years of education: Not on file   Highest education level: Not on file  Occupational History   Occupation: retired  Tobacco Use   Smoking status: Former    Current packs/day: 0.00    Average packs/day: 0.3 packs/day for 3.0 years (0.8 ttl pk-yrs)    Types: Cigarettes    Start date: 03/30/1966    Quit date: 03/30/1969    Years since quitting:  54.3    Passive exposure: Past   Smokeless tobacco: Never  Vaping Use   Vaping status: Never Used  Substance and Sexual Activity   Alcohol use: Not Currently    Alcohol/week: 14.0 standard drinks of alcohol    Types: 7 Glasses of wine, 7 Shots of liquor per week    Comment: Jack nightly   Drug use: No   Sexual activity: Yes  Other Topics Concern   Not on file  Social History Narrative   Not on file   Social Drivers of Health   Financial Resource Strain: Low Risk  (11/12/2020)   Overall Financial Resource Strain (CARDIA)    Difficulty of Paying Living Expenses:  Not hard at all  Food Insecurity: No Food Insecurity (10/26/2022)   Hunger Vital Sign    Worried About Running Out of Food in the Last Year: Never true    Ran Out of Food in the Last Year: Never true  Transportation Needs: No Transportation Needs (10/26/2022)   PRAPARE - Administrator, Civil Service (Medical): No    Lack of Transportation (Non-Medical): No  Physical Activity: Sufficiently Active (11/25/2021)   Exercise Vital Sign    Days of Exercise per Week: 5 days    Minutes of Exercise per Session: 30 min  Stress: No Stress Concern Present (11/25/2021)   [employer] of Occupational Health - Occupational Stress Questionnaire    Feeling of Stress : Not at all  Social Connections: Socially Integrated (11/25/2021)   Social Connection and Isolation Panel [NHANES]    Frequency of Communication with Friends and Family: More than three times a week    Frequency of Social Gatherings with Friends and Family: More than three times a week    Attends Religious Services: More than 4 times per year    Active Member of [employer] or Organizations: Yes    Attends Engineer, structural: More than 4 times per year    Marital Status: Married  Catering manager Violence: Not At Risk (10/26/2022)   Humiliation, Afraid, Rape, and Kick questionnaire    Fear of Current or Ex-Partner: No    Emotionally Abused: No     Physically Abused: No    Sexually Abused: No     Physical Exam   Vitals:   07/14/23 0100 07/14/23 0115  BP: 123/84 138/80  Pulse: 74 76  Resp:    Temp:    SpO2: 97% 92%    CONSTITUTIONAL: Well-appearing, NAD NEURO/PSYCH:  Alert and oriented x 3, no focal deficits EYES:  eyes equal and reactive ENT/NECK:  no LAD, no JVD CARDIO: Regular rate, well-perfused, normal S1 and S2 PULM:  CTAB no wheezing or rhonchi GI/GU:  non-distended, non-tender MSK/SPINE:  No gross deformities, no edema SKIN:  no rash, atraumatic   *Additional and/or pertinent findings included in MDM below  Diagnostic and Interventional Summary    EKG Interpretation Date/Time:  Wednesday July 14 2023 00:07:44 EST Ventricular Rate:  89 PR Interval:    QRS Duration:  90 QT Interval:  376 QTC Calculation: 457 R Axis:   7  Text Interpretation: Atrial fibrillation Septal infarct (cited on or before 28-Apr-2021) T wave abnormality, consider inferior ischemia Abnormal ECG When compared with ECG of 09-Jul-2023 14:17, No significant change was found Confirmed by Kennis Carina [phone]) on 07/14/2023 3:26:58 AM       Labs Reviewed  COMPREHENSIVE METABOLIC PANEL - Abnormal; Notable for the following components:      Result Value   Glucose, Bld 145 (*)    BUN 28 (*)    All other components within normal limits  D-DIMER, QUANTITATIVE - Abnormal; Notable for the following components:   D-Dimer, Quant 0.55 (*)    All other components within normal limits  TROPONIN I (HIGH SENSITIVITY) - Abnormal; Notable for the following components:   Troponin I (High Sensitivity) 20 (*)    All other components within normal limits  TROPONIN I (HIGH SENSITIVITY) - Abnormal; Notable for the following components:   Troponin I (High Sensitivity) 20 (*)    All other components within normal limits  CBC    CT Angio Chest Pulmonary Embolism (PE) W or WO Contrast  Final Result  DG Chest Port 1 View  Final Result       Medications  iohexol (OMNIPAQUE) 350 MG/ML injection 75 mL (75 mLs Intravenous Contrast Given 07/14/23 0125)     Procedures  /  Critical Care Procedures  ED Course and Medical Decision Making  Initial Impression and Ddx Patient is endorsing a very atypical and migratory chest discomfort as well as palpitations, burning of the eyes which has resolved.  He is in no acute distress on exam, no increased work of breathing, clear lungs, no significant murmur, vitals are normal.  He is in A-fib with controlled rate, known history of persistent A-fib, anticoagulated.  He did miss a dose, he is endorsing symptoms similar to prior blood clot and so PE is considered.  Overall highly doubt primary cardiac cause of patient's symptoms such as ACS.  Very atypical in nature.  Past medical/surgical history that increases complexity of ED encounter: History of PE  Interpretation of Diagnostics I personally reviewed the EKG and my interpretation is as follows: A-fib, no significant change from prior  Labs overall reassuring with no significant blood count or electrolyte disturbance, minimally elevated D-dimer, minimally elevated troponin that is flat upon repeat.  Patient Reassessment and Ultimate Disposition/Management     CTA is negative for PE.  Patient feeling better, still having this very mild abnormal sensation to the chest, he is quite surprised that he does not have a blood clot.  Suspect anxiety component, appropriate for discharge with follow-up.  Patient management required discussion with the following services or consulting groups:  None  Complexity of Problems Addressed Acute illness or injury that poses threat of life of bodily function  Additional Data Reviewed and Analyzed Further history obtained from: Prior labs/imaging results  Additional Factors Impacting ED Encounter Risk Consideration of hospitalization  Elmer Sow. Pilar Plate, MD [facility] Emergency Medicine [facility]  Health [email or web address]  Final Clinical Impressions(s) / ED Diagnoses     ICD-10-CM   1. Palpitations  R00.2       ED Discharge Orders     None        Discharge Instructions Discussed with and Provided to Patient:    Discharge Instructions      You were evaluated in the Emergency Department and after careful evaluation, we did not find any emergent condition requiring admission or further testing in the hospital.  Your exam/testing today is overall reassuring.  Recommend close follow-up with your regular doctors and cardiologist to discuss her symptoms.  Continue your home medications.  Please return to the Emergency Department if you experience any worsening of your condition.   Thank you for allowing [place] to be a part of your care.      Sabas Sous, MD 07/14/23 [phone]

## 2023-07-26 DIAGNOSIS — L570 Actinic keratosis: Secondary | ICD-10-CM | POA: Diagnosis not present

## 2023-07-26 DIAGNOSIS — X32XXXD Exposure to sunlight, subsequent encounter: Secondary | ICD-10-CM | POA: Diagnosis not present

## 2023-08-06 ENCOUNTER — Encounter (HOSPITAL_COMMUNITY): Payer: Self-pay

## 2023-08-09 ENCOUNTER — Telehealth (HOSPITAL_COMMUNITY): Payer: Self-pay | Admitting: *Deleted

## 2023-08-09 NOTE — Telephone Encounter (Signed)
 Reaching out to patient to offer assistance regarding upcoming cardiac imaging study; pt verbalizes understanding of appt date/time, parking situation and where to check in, pre-test NPO status and medications ordered, and verified current allergies; name and call back number provided for further questions should they arise  Larey Brick RN Navigator Cardiac Imaging [facility] [phone] office [phone] cell  Patient to take 100mg  metoprolol tartrate two hours prior to his cardiac CT scan.

## 2023-08-10 ENCOUNTER — Other Ambulatory Visit: Payer: Self-pay | Admitting: Cardiology

## 2023-08-10 ENCOUNTER — Ambulatory Visit (HOSPITAL_BASED_OUTPATIENT_CLINIC_OR_DEPARTMENT_OTHER)
Admission: RE | Admit: 2023-08-10 | Discharge: 2023-08-10 | Disposition: A | Discharge status: Home / Self Care | Source: Ambulatory Visit | Attending: Cardiology | Admitting: Cardiology

## 2023-08-10 ENCOUNTER — Encounter (HOSPITAL_BASED_OUTPATIENT_CLINIC_OR_DEPARTMENT_OTHER): Payer: Self-pay

## 2023-08-10 ENCOUNTER — Ambulatory Visit (HOSPITAL_BASED_OUTPATIENT_CLINIC_OR_DEPARTMENT_OTHER)
Admission: RE | Admit: 2023-08-10 | Discharge: 2023-08-10 | Disposition: A | Discharge status: Home / Self Care | Payer: PPO | Source: Ambulatory Visit | Attending: Student | Admitting: Student

## 2023-08-10 DIAGNOSIS — I2510 Atherosclerotic heart disease of native coronary artery without angina pectoris: Secondary | ICD-10-CM | POA: Insufficient documentation

## 2023-08-10 DIAGNOSIS — R931 Abnormal findings on diagnostic imaging of heart and coronary circulation: Secondary | ICD-10-CM

## 2023-08-10 DIAGNOSIS — R0609 Other forms of dyspnea: Secondary | ICD-10-CM | POA: Diagnosis not present

## 2023-08-10 MED ORDER — NITROGLYCERIN 0.4 MG SL SUBL
0.8000 mg | SUBLINGUAL_TABLET | Freq: Once | SUBLINGUAL | Status: AC
Start: 2023-08-10 — End: 2023-08-10
  Administered 2023-08-10: 0.8 mg via SUBLINGUAL

## 2023-08-10 MED ORDER — IOHEXOL 350 MG/ML SOLN
100.0000 mL | Freq: Once | INTRAVENOUS | Status: AC | PRN
  Administered 2023-08-10: 95 mL via INTRAVENOUS

## 2023-08-10 NOTE — Progress Notes (Signed)
 FFR order

## 2023-08-12 ENCOUNTER — Other Ambulatory Visit: Payer: Self-pay

## 2023-08-12 ENCOUNTER — Other Ambulatory Visit (HOSPITAL_COMMUNITY)
Admission: RE | Admit: 2023-08-12 | Discharge: 2023-08-12 | Disposition: A | Discharge status: Home / Self Care | Source: Ambulatory Visit | Attending: Internal Medicine | Admitting: Internal Medicine

## 2023-08-12 ENCOUNTER — Encounter: Payer: Self-pay | Admitting: Internal Medicine

## 2023-08-12 ENCOUNTER — Ambulatory Visit: Attending: Internal Medicine | Admitting: Internal Medicine

## 2023-08-12 VITALS — BP 124/70 | HR 72 | Ht 66.0 in | Wt 189.6 lb

## 2023-08-12 DIAGNOSIS — R931 Abnormal findings on diagnostic imaging of heart and coronary circulation: Secondary | ICD-10-CM

## 2023-08-12 DIAGNOSIS — I2510 Atherosclerotic heart disease of native coronary artery without angina pectoris: Secondary | ICD-10-CM | POA: Diagnosis not present

## 2023-08-12 DIAGNOSIS — Z79899 Other long term (current) drug therapy: Secondary | ICD-10-CM

## 2023-08-12 LAB — CBC
HCT: 42.9 % (ref 39.0–52.0)
Hemoglobin: 14.2 g/dL (ref 13.0–17.0)
MCH: 30.9 pg (ref 26.0–34.0)
MCHC: 33.1 g/dL (ref 30.0–36.0)
MCV: 93.3 fL (ref 80.0–100.0)
Platelets: 208 10*3/uL (ref 150–400)
RBC: 4.6 MIL/uL (ref 4.22–5.81)
RDW: 12.3 % (ref 11.5–15.5)
WBC: 4.8 10*3/uL (ref 4.0–10.5)
nRBC: 0 % (ref 0.0–0.2)

## 2023-08-12 LAB — BASIC METABOLIC PANEL WITH GFR
Anion gap: 10 (ref 5–15)
BUN: 25 mg/dL — ABNORMAL HIGH (ref 8–23)
CO2: 26 mmol/L (ref 22–32)
Calcium: 9.3 mg/dL (ref 8.9–10.3)
Chloride: 102 mmol/L (ref 98–111)
Creatinine, Ser: 1.31 mg/dL — ABNORMAL HIGH (ref 0.61–1.24)
GFR, Estimated: 54 mL/min — ABNORMAL LOW (ref >60)
Glucose, Bld: 129 mg/dL — ABNORMAL HIGH (ref 70–99)
Potassium: 3.8 mmol/L (ref 3.5–5.1)
Sodium: 138 mmol/L (ref 135–145)

## 2023-08-12 NOTE — Progress Notes (Signed)
 Cardiology Office Note  Date: 08/12/2023   ID: [name], DOB [birth year], MRN [id number]  PCP:  Babs Sciara, MD  Cardiologist:  Marjo Bicker, MD Electrophysiologist:  None   Reason for Office Visit: Follow-up of CCTA results   History of Present Illness: [name] is a 83 y.o. male known to have paroxysmal A-fib s/p ablation in 2011 with recurrence of A-fib, history of DVT/PE on Eliquis, HTN, HLD, mild aortic valve stenosis, chronic back pain (had multiple back surgeries) presented to cardiology clinic for follow-up visit to discuss CCTA results.  He reported having stable DOE for 2 years but noticed worsening of his symptoms in the last 1 year to the point he is not able to walk even 50 feet without having symptoms of shortness of breath.  He also noticed worsening orthopnea recently. No angina per se except for 1 episode of chest discomfort lasting for about few minutes, this was around 3 weeks ago.  No dizziness, lightheadedness, syncope, palpitations or leg swelling. EKG today showed atrial fibrillation, rate controlled.  He underwent a CCTA cardiac that showed coronary calcium score of 3221, 89th percentile for age and sex matched control.  Total plaque volume is severe. CT cardiac showed multivessel CAD, flow-limiting lesions in the distal RCA and mid circumflex. LM assessment was limited on FFR. Distal part of left atrial appendage was not well-visualized, unable to rule out a left atrial appendage thrombus completely.  Past [name] History:  Diagnosis Date   Arthritis    Cancer (HCC)    skin   Carpal tunnel syndrome    Cataract    left eye   Chronic back pain    multiple back surgeries   Complication of anesthesia    pt states woke up during hemorroid surgery and stopped breathing during his back surgery   DVT (deep venous thrombosis) (HCC) 03/2007   left leg    Dysrhythmia    GERD (gastroesophageal reflux disease)    takes Protonix daily   Glaucoma     History of colon polyps    History of hyperglycemia    History of MRSA infection 1992   HTN (hypertension)    takes  Ramipril daily   Hyperlipidemia    takes Pravastatin 3 times a week   Nocturia    Paroxysmal atrial fibrillation (HCC)    takes Pradaxa daily, Chads2vasc score 2(07/11/14)   Pneumonia    many yrs ago   Pulmonary embolism (HCC) 2008    Past Surgical History:  Procedure Laterality Date   APPLICATION OF ROBOTIC ASSISTANCE FOR SPINAL PROCEDURE  05/06/2021   Procedure: APPLICATION OF ROBOTIC ASSISTANCE FOR SPINAL PROCEDURE;  Surgeon: Barnett Abu, MD;  [facility];  Service: Neurosurgery;;   BACK SURGERY     BIOPSY  04/01/2018   Procedure: BIOPSY;  Surgeon: Malissa Hippo, MD;  [facility];  Service: Endoscopy;;  gastric   BIOPSY  05/14/2023   Procedure: BIOPSY;  Surgeon: Franky Macho, MD;  [facility];  Service: Endoscopy;;   CARDIAC CATHETERIZATION  05/18/2006   CARPAL TUNNEL RELEASE Right    cataract surgery Right    cervical and lumbar surgery     COLONOSCOPY  04/24/2011   Procedure: COLONOSCOPY;  Surgeon: Malissa Hippo, MD;  [facility];  Service: Endoscopy;  Laterality: N/A;  1200   COLONOSCOPY N/A 08/13/2016   Procedure: COLONOSCOPY;  Surgeon: Malissa Hippo, MD;  [facility];  Service: Endoscopy;  Laterality: N/A;  1200   COLONOSCOPY WITH PROPOFOL N/A 12/22/2022   Procedure: COLONOSCOPY WITH PROPOFOL;  Surgeon: Dolores Frame, MD;  [facility];  Service: Gastroenterology;  Laterality: N/A;  11:15am;asa 3   ESOPHAGOGASTRODUODENOSCOPY  03/10/2012   Procedure: ESOPHAGOGASTRODUODENOSCOPY (EGD);  Surgeon: Malissa Hippo, MD;  [facility];  Service: Endoscopy;  Laterality: N/A;  325   ESOPHAGOGASTRODUODENOSCOPY (EGD) WITH PROPOFOL N/A 04/01/2018   Procedure: ESOPHAGOGASTRODUODENOSCOPY (EGD) WITH PROPOFOL;  Surgeon: Malissa Hippo, MD;  [facility];   Service: Endoscopy;  Laterality: N/A;   ESOPHAGOGASTRODUODENOSCOPY (EGD) WITH PROPOFOL N/A 05/14/2023   Procedure: ESOPHAGOGASTRODUODENOSCOPY (EGD) WITH PROPOFOL;  Surgeon: Franky Macho, MD;  [facility];  Service: Endoscopy;  Laterality: N/A;  8:00am;asa 3   EYE SURGERY     HARDWARE REMOVAL N/A 04/24/2014   Procedure: REMOVAL OF SACRAL INSTRUMENTATION WITH METREX;  Surgeon: Barnett Abu, MD;  [facility];  Service: Neurosurgery;  Laterality: N/A;   HEMORRHOIDECTOMY WITH HEMORRHOID BANDING     JOINT REPLACEMENT     KNEE ARTHROSCOPY Right    KNEE ARTHROSCOPY WITH MEDIAL MENISECTOMY Right 08/03/2018   Procedure: RIGHT KNEE ARTHROSCOPY WITH PARTIAL MEDIAL MENISCECTOMY;  Surgeon: Tarry Kos, MD;  Location: [facility];  Service: Orthopedics;  Laterality: Right;   KNEE SURGERY     left   LASIK     POLYPECTOMY  08/13/2016   Procedure: POLYPECTOMY;  Surgeon: Malissa Hippo, MD;  [facility];  Service: Endoscopy;;  colon   POLYPECTOMY  12/22/2022   Procedure: POLYPECTOMY;  Surgeon: Dolores Frame, MD;  [facility];  Service: Gastroenterology;;   POLYPECTOMY  05/14/2023   Procedure: POLYPECTOMY;  Surgeon: Franky Macho, MD;  [facility];  Service: Endoscopy;;   PVI  01/24/2010   afib ablation   TRANSFORAMINAL LUMBAR INTERBODY FUSION (TLIF) WITH PEDICLE SCREW FIXATION 1 LEVEL N/A 05/06/2021   Procedure: Transforaminal Lumbar Interbody Fusion  - Thoracic twelve-Lumbar one fixation Thoracic nine to Lumbar one with methacrylate augmentation with mazor;  Surgeon: Barnett Abu, MD;  [facility]: [facility];  Service: Neurosurgery;  Laterality: N/A;    Current Outpatient Medications  Medication Sig Dispense Refill   amLODipine (NORVASC) 10 MG tablet TAKE 1 TABLET BY MOUTH EVERY DAY 90 tablet 1   ELIQUIS 5 MG TABS tablet TAKE 1 TABLET BY MOUTH TWICE A DAY. STOP PRADAXA (Patient taking differently: 5 mg.) 60 tablet 5    indapamide (LOZOL) 1.25 MG tablet TAKE 1 TABLET BY MOUTH DAILY. 90 tablet 1   latanoprost (XALATAN) 0.005 % ophthalmic solution Place 1 drop into both eyes at bedtime.     metoprolol succinate (TOPROL-XL) 25 MG 24 hr tablet TAKE 1 TABLET BY MOUTH TWICE A DAY 180 tablet 2   Oxycodone HCl 10 MG TABS 1 tablet taken 3 or 4 times daily as needed for pain caution drowsiness 120 tablet 0   pantoprazole (PROTONIX) 40 MG tablet Take 1 tablet (40 mg total) by mouth 2 (two) times daily. 180 tablet 1   pravastatin (PRAVACHOL) 80 MG tablet TAKE 1 TABLET BY MOUTH EVERYDAY AT BEDTIME 90 tablet 1   psyllium (METAMUCIL SMOOTH TEXTURE) 58.6 % powder Take 1 packet by mouth at bedtime.     ramipril (ALTACE) 10 MG capsule TAKE 1 CAPSULE BY MOUTH 2 TIMES DAILY. 180 capsule 1   sucralfate (CARAFATE) 1 g tablet CRUSH MIX IN WATER USE THREE  TIMES DAY FOR 30 DAYS 270 tablet 1   tamsulosin (FLOMAX) 0.4 MG CAPS capsule Take 1 capsule (0.4 mg total) by mouth 2 (two) times daily. 180 capsule 3   torsemide (DEMADEX) 10 MG tablet Take 1 tablet (10 mg total) by mouth daily. 30 tablet 5   XDEMVY 0.25 % SOLN Apply to eye.     Oxycodone HCl 10 MG TABS 1 taken 4 times daily as needed pain (Patient not taking: Reported on 08/12/2023) 120 tablet 0   Oxycodone HCl 10 MG TABS 1 taken 4 times daily as needed for pain (Patient not taking: Reported on 08/12/2023) 120 tablet 0   No current facility-administered medications for this visit.   Allergies:  Sectral [acebutolol hcl], Sulfonamide derivatives, and Zocor [simvastatin]   Social History: The patient  reports that he quit smoking about 54 years ago. His smoking use included cigarettes. He started smoking about 57 years ago. He has a 0.8 pack-year smoking history. He has been exposed to tobacco smoke. He has never used smokeless tobacco. He reports that he does not currently use alcohol after a past usage of about 14.0 standard drinks of alcohol per week. He reports that he does not use  drugs.   Family History: The patient's family history includes Colon cancer in his sister; Diabetes in his brother; Healthy in his daughter; Heart disease in his father and another family member; Hypertension in his mother and another family member; Transient ischemic attack in his mother.   ROS:  Please see the history of present illness. Otherwise, complete review of systems is positive for none.  All other systems are reviewed and negative.   Physical Exam: VS:  BP 124/70   Pulse 72   Ht 5\' 6"  (1.676 m)   Wt 189 lb 9.6 oz (86 kg)   SpO2 97%   BMI 30.60 kg/m , BMI Body mass index is 30.6 kg/m.  Wt Readings from Last 3 Encounters:  08/12/23 189 lb 9.6 oz (86 kg)  07/14/23 187 lb 13.3 oz (85.2 kg)  07/12/23 187 lb 12.8 oz (85.2 kg)    General: Patient appears comfortable at rest. HEENT: Conjunctiva and lids normal, oropharynx clear with moist mucosa. Neck: Supple, no elevated JVP or carotid bruits, no thyromegaly. Lungs: Clear to auscultation, nonlabored breathing at rest. Cardiac: Regular rate and rhythm, no S3 or significant systolic murmur, no pericardial rub. Abdomen: Soft, nontender, no hepatomegaly, bowel sounds present, no guarding or rebound. Extremities: No pitting edema, distal pulses 2+. Skin: Warm and dry. Musculoskeletal: No kyphosis. Neuropsychiatric: Alert and oriented x3, affect grossly appropriate.  ECG: NSR  Recent Labwork: 11/02/2022: Magnesium 2.2 04/29/2023: BNP 321.6 07/14/2023: ALT 34; AST 33; BUN 28; Creatinine, Ser 0.98; Hemoglobin 14.6; Platelets 216; Potassium 3.7; Sodium 138     Component Value Date/Time   CHOL 141 03/26/2023 0946   TRIG 113 03/26/2023 0946   HDL 40 03/26/2023 0946   CHOLHDL 3.5 03/26/2023 0946   CHOLHDL 2.9 03/16/2014 1219   VLDL 12 03/16/2014 1219   LDLCALC 80 03/26/2023 0946      Assessment and Plan:  CAD manifested by SIHD: Ongoing DOE x 2 years but worsened in the last 1 year, not able to walk even 50 feet.   Worsening orthopnea as well.  No angina except for 1 episode around 3 weeks ago.  CT cardiac showed multivessel CAD, flow-limiting lesions in the mid LCx and distal RCA.  LM assessment was limited on FFR.  Not on aspirin due to  Eliquis use, continue atorvastatin 80 mg nightly.  He will benefit from invasive ischemia evaluation with LHC and RHC due to multivessel CAD with flow-limiting lesions in LCx/RCA on cardiac CT and worsening DOE/orthopnea.  He is scheduled for LHC/RHC on 08/18/2023 with Dr. Clifton James.  Okay to hold Eliquis 2 days prior to the procedure.  Informed consent for LHC and RHC Risks and benefits of cardiac catheterization have been discussed with the patient.  These include bleeding, infection, kidney damage, stroke, heart attack, death.  The patient understands these risks and is willing to proceed.  Persistent A-fib: EKG today showed atrial fibrillation, rate controlled.  He underwent PVI in 2011 with no recurrence until recently.  Continue metoprolol succinate 25 mg twice daily and Eliquis 5 mg twice daily.  Mild aortic valve stenosis: Echocardiogram from 2023 and 2025 showed mild aortic valve stenosis.  HTN, controlled: Continue current antihypertensives.  Amlodipine 10 mg once daily, metoprolol succinate 25 mg twice daily, ramipril 10 mg twice daily and torsemide 10 mg once daily.  HTN management per PCP.  HLD, partially at goal: LDL 80 in 2024.  Currently atorvastatin 80 mg nightly which I will continue.  Goal LDL less than 70.    Medication Adjustments/Labs and Tests Ordered: Current medicines are reviewed at length with the patient today.  Concerns regarding medicines are outlined above.   Tests Ordered: Orders Placed This Encounter  Procedures   EKG 12-Lead    Medication Changes: No orders of the defined types were placed in this encounter.   Disposition:  Follow up 1 month after LHC  Signed, Emonii Wienke Verne Spurr, MD, 08/12/2023 10:16 AM    [name]  [facility] [address], [place], [place] [address]

## 2023-08-12 NOTE — Patient Instructions (Signed)
 Medication Instructions:  Your physician recommends that you continue on your current medications as directed. Please refer to the Current Medication list given to you today.  *If you need a refill on your cardiac medications before your next appointment, please call your pharmacy*   Lab Work: CBC BMET  If you have labs (blood work) drawn today and your tests are completely normal, you will receive your results only by: MyChart Message (if you have MyChart) OR A paper copy in the mail If you have any lab test that is abnormal or we need to change your treatment, we will call you to review the results.   Testing/Procedures: L/RHC   Follow-Up: At [facility], you and your health needs are our priority.  As part of our continuing mission to provide you with exceptional heart care, we have created designated Provider Care Teams.  These Care Teams include your primary Cardiologist (physician) and Advanced Practice Providers (APPs -  Physician Assistants and Nurse Practitioners) who all work together to provide you with the care you need, when you need it.  We recommend signing up for the patient portal called "MyChart".  Sign up information is provided on this After Visit Summary.  MyChart is used to connect with patients for Virtual Visits (Telemedicine).  Patients are able to view lab/test results, encounter notes, upcoming appointments, etc.  Non-urgent messages can be sent to your provider as well.   To learn more about what you can do with MyChart, go to [email or web address].    Your next appointment:   1 month(s)  Provider:   Randall An, PA-C    Other Instructions         [facility] A DEPT [facility] [address] [place] [address] Dept: [phone] Loc: [phone]  [name]  08/12/2023  You are scheduled for a Cardiac Catheterization on Wednesday, April 2 with Dr.  Verne Carrow.  1. Please arrive at the [facility] (Main Entrance A) at [facility]: [address] [place], [place] [address] at 6:30 AM (This time is 2 hour(s) before your procedure to ensure your preparation).   Free valet parking service is available. You will check in at ADMITTING. The support person will be asked to wait in the waiting room.  It is OK to have someone drop you off and come back when you are ready to be discharged.    Special note: Every effort is made to have your procedure done on time. Please understand that emergencies sometimes delay scheduled procedures.  2. Diet: Do not eat solid foods after midnight.  The patient may have clear liquids until 5am upon the day of the procedure.  3. Labs: You will need to have blood drawn on TODAY  4. Medication instructions in preparation for your procedure:   Contrast Allergy: No  Stop taking Eliquis (Apixiban) on Monday, March 31.  Stop taking, Torsemide (Demadex) Wednesday, April 2, Stop taking Indapamide Wednesday April 2.  Hold Oxycodone the morning of you procedure.   On the morning of your procedure, take your Aspirin 81 mg and any morning medicines NOT listed above.  You may use sips of water.  5. Plan to go home the same day, you will only stay overnight if medically necessary. 6. Bring a current list of your medications and current insurance cards. 7. You MUST have a responsible person to drive you home. 8. Someone MUST be  with you the first 24 hours after you arrive home or your discharge will be delayed. 9. Please wear clothes that are easy to get on and off and wear slip-on shoes.  Thank you for allowing [place] to care for you!   -- [facility]

## 2023-08-12 NOTE — H&P (View-Only) (Signed)
 Cardiology Office Note  Date: 08/12/2023   ID: [name], DOB [birth year], MRN [id number]  PCP:  Babs Sciara, MD  Cardiologist:  Marjo Bicker, MD Electrophysiologist:  None   Reason for Office Visit: Follow-up of CCTA results   History of Present Illness: [name] is a 83 y.o. male known to have paroxysmal A-fib s/p ablation in 2011 with recurrence of A-fib, history of DVT/PE on Eliquis, HTN, HLD, mild aortic valve stenosis, chronic back pain (had multiple back surgeries) presented to cardiology clinic for follow-up visit to discuss CCTA results.  He reported having stable DOE for 2 years but noticed worsening of his symptoms in the last 1 year to the point he is not able to walk even 50 feet without having symptoms of shortness of breath.  He also noticed worsening orthopnea recently. No angina per se except for 1 episode of chest discomfort lasting for about few minutes, this was around 3 weeks ago.  No dizziness, lightheadedness, syncope, palpitations or leg swelling. EKG today showed atrial fibrillation, rate controlled.  He underwent a CCTA cardiac that showed coronary calcium score of 3221, 89th percentile for age and sex matched control.  Total plaque volume is severe. CT cardiac showed multivessel CAD, flow-limiting lesions in the distal RCA and mid circumflex. LM assessment was limited on FFR. Distal part of left atrial appendage was not well-visualized, unable to rule out a left atrial appendage thrombus completely.  Past Medical History:  Diagnosis Date   Arthritis    Cancer (HCC)    skin   Carpal tunnel syndrome    Cataract    left eye   Chronic back pain    multiple back surgeries   Complication of anesthesia    pt states woke up during hemorroid surgery and stopped breathing during his back surgery   DVT (deep venous thrombosis) (HCC) 03/2007   left leg    Dysrhythmia    GERD (gastroesophageal reflux disease)    takes Protonix daily   Glaucoma     History of colon polyps    History of hyperglycemia    History of MRSA infection 1992   HTN (hypertension)    takes  Ramipril daily   Hyperlipidemia    takes Pravastatin 3 times a week   Nocturia    Paroxysmal atrial fibrillation (HCC)    takes Pradaxa daily, Chads2vasc score 2(07/11/14)   Pneumonia    many yrs ago   Pulmonary embolism (HCC) 2008    Past Surgical History:  Procedure Laterality Date   APPLICATION OF ROBOTIC ASSISTANCE FOR SPINAL PROCEDURE  05/06/2021   Procedure: APPLICATION OF ROBOTIC ASSISTANCE FOR SPINAL PROCEDURE;  Surgeon: Barnett Abu, MD;  [facility]: [facility];  Service: Neurosurgery;;   BACK SURGERY     BIOPSY  04/01/2018   Procedure: BIOPSY;  Surgeon: Malissa Hippo, MD;  [facility];  Service: Endoscopy;;  gastric   BIOPSY  05/14/2023   Procedure: BIOPSY;  Surgeon: Franky Macho, MD;  [facility];  Service: Endoscopy;;   CARDIAC CATHETERIZATION  05/18/2006   CARPAL TUNNEL RELEASE Right    cataract surgery Right    cervical and lumbar surgery     COLONOSCOPY  04/24/2011   Procedure: COLONOSCOPY;  Surgeon: Malissa Hippo, MD;  [facility];  Service: Endoscopy;  Laterality: N/A;  1200   COLONOSCOPY N/A 08/13/2016   Procedure: COLONOSCOPY;  Surgeon: Malissa Hippo, MD;  [facility];  Service: Endoscopy;  Laterality: N/A;  1200   COLONOSCOPY WITH PROPOFOL N/A 12/22/2022   Procedure: COLONOSCOPY WITH PROPOFOL;  Surgeon: Dolores Frame, MD;  [facility];  Service: Gastroenterology;  Laterality: N/A;  11:15am;asa 3   ESOPHAGOGASTRODUODENOSCOPY  03/10/2012   Procedure: ESOPHAGOGASTRODUODENOSCOPY (EGD);  Surgeon: Malissa Hippo, MD;  [facility];  Service: Endoscopy;  Laterality: N/A;  325   ESOPHAGOGASTRODUODENOSCOPY (EGD) WITH PROPOFOL N/A 04/01/2018   Procedure: ESOPHAGOGASTRODUODENOSCOPY (EGD) WITH PROPOFOL;  Surgeon: Malissa Hippo, MD;  [facility];   Service: Endoscopy;  Laterality: N/A;   ESOPHAGOGASTRODUODENOSCOPY (EGD) WITH PROPOFOL N/A 05/14/2023   Procedure: ESOPHAGOGASTRODUODENOSCOPY (EGD) WITH PROPOFOL;  Surgeon: Franky Macho, MD;  [facility];  Service: Endoscopy;  Laterality: N/A;  8:00am;asa 3   EYE SURGERY     HARDWARE REMOVAL N/A 04/24/2014   Procedure: REMOVAL OF SACRAL INSTRUMENTATION WITH METREX;  Surgeon: Barnett Abu, MD;  [facility]: [facility];  Service: Neurosurgery;  Laterality: N/A;   HEMORRHOIDECTOMY WITH HEMORRHOID BANDING     JOINT REPLACEMENT     KNEE ARTHROSCOPY Right    KNEE ARTHROSCOPY WITH MEDIAL MENISECTOMY Right 08/03/2018   Procedure: RIGHT KNEE ARTHROSCOPY WITH PARTIAL MEDIAL MENISCECTOMY;  Surgeon: Tarry Kos, MD;  Location: [facility];  Service: Orthopedics;  Laterality: Right;   KNEE SURGERY     left   LASIK     POLYPECTOMY  08/13/2016   Procedure: POLYPECTOMY;  Surgeon: Malissa Hippo, MD;  [facility];  Service: Endoscopy;;  colon   POLYPECTOMY  12/22/2022   Procedure: POLYPECTOMY;  Surgeon: Dolores Frame, MD;  [facility];  Service: Gastroenterology;;   POLYPECTOMY  05/14/2023   Procedure: POLYPECTOMY;  Surgeon: Franky Macho, MD;  [facility];  Service: Endoscopy;;   PVI  01/24/2010   afib ablation   TRANSFORAMINAL LUMBAR INTERBODY FUSION (TLIF) WITH PEDICLE SCREW FIXATION 1 LEVEL N/A 05/06/2021   Procedure: Transforaminal Lumbar Interbody Fusion  - Thoracic twelve-Lumbar one fixation Thoracic nine to Lumbar one with methacrylate augmentation with mazor;  Surgeon: Barnett Abu, MD;  Location: [facility];  Service: Neurosurgery;  Laterality: N/A;    Current Outpatient Medications  Medication Sig Dispense Refill   amLODipine (NORVASC) 10 MG tablet TAKE 1 TABLET BY MOUTH EVERY DAY 90 tablet 1   ELIQUIS 5 MG TABS tablet TAKE 1 TABLET BY MOUTH TWICE A DAY. STOP PRADAXA (Patient taking differently: 5 mg.) 60 tablet 5    indapamide (LOZOL) 1.25 MG tablet TAKE 1 TABLET BY MOUTH DAILY. 90 tablet 1   latanoprost (XALATAN) 0.005 % ophthalmic solution Place 1 drop into both eyes at bedtime.     metoprolol succinate (TOPROL-XL) 25 MG 24 hr tablet TAKE 1 TABLET BY MOUTH TWICE A DAY 180 tablet 2   Oxycodone HCl 10 MG TABS 1 tablet taken 3 or 4 times daily as needed for pain caution drowsiness 120 tablet 0   pantoprazole (PROTONIX) 40 MG tablet Take 1 tablet (40 mg total) by mouth 2 (two) times daily. 180 tablet 1   pravastatin (PRAVACHOL) 80 MG tablet TAKE 1 TABLET BY MOUTH EVERYDAY AT BEDTIME 90 tablet 1   psyllium (METAMUCIL SMOOTH TEXTURE) 58.6 % powder Take 1 packet by mouth at bedtime.     ramipril (ALTACE) 10 MG capsule TAKE 1 CAPSULE BY MOUTH 2 TIMES DAILY. 180 capsule 1   sucralfate (CARAFATE) 1 g tablet CRUSH MIX IN WATER USE THREE  TIMES DAY FOR 30 DAYS 270 tablet 1   tamsulosin (FLOMAX) 0.4 MG CAPS capsule Take 1 capsule (0.4 mg total) by mouth 2 (two) times daily. 180 capsule 3   torsemide (DEMADEX) 10 MG tablet Take 1 tablet (10 mg total) by mouth daily. 30 tablet 5   XDEMVY 0.25 % SOLN Apply to eye.     Oxycodone HCl 10 MG TABS 1 taken 4 times daily as needed pain (Patient not taking: Reported on 08/12/2023) 120 tablet 0   Oxycodone HCl 10 MG TABS 1 taken 4 times daily as needed for pain (Patient not taking: Reported on 08/12/2023) 120 tablet 0   No current facility-administered medications for this visit.   Allergies:  Sectral [acebutolol hcl], Sulfonamide derivatives, and Zocor [simvastatin]   Social History: The patient  reports that he quit smoking about 54 years ago. His smoking use included cigarettes. He started smoking about 57 years ago. He has a 0.8 pack-year smoking history. He has been exposed to tobacco smoke. He has never used smokeless tobacco. He reports that he does not currently use alcohol after a past usage of about 14.0 standard drinks of alcohol per week. He reports that he does not use  drugs.   Family History: The patient's family history includes Colon cancer in his sister; Diabetes in his brother; Healthy in his daughter; Heart disease in his father and another family member; Hypertension in his mother and another family member; Transient ischemic attack in his mother.   ROS:  Please see the history of present illness. Otherwise, complete review of systems is positive for none.  All other systems are reviewed and negative.   Physical Exam: VS:  BP 124/70   Pulse 72   Ht 5\' 6"  (1.676 m)   Wt 189 lb 9.6 oz (86 kg)   SpO2 97%   BMI 30.60 kg/m , BMI Body mass index is 30.6 kg/m.  Wt Readings from Last 3 Encounters:  08/12/23 189 lb 9.6 oz (86 kg)  07/14/23 187 lb 13.3 oz (85.2 kg)  07/12/23 187 lb 12.8 oz (85.2 kg)    General: Patient appears comfortable at rest. HEENT: Conjunctiva and lids normal, oropharynx clear with moist mucosa. Neck: Supple, no elevated JVP or carotid bruits, no thyromegaly. Lungs: Clear to auscultation, nonlabored breathing at rest. Cardiac: Regular rate and rhythm, no S3 or significant systolic murmur, no pericardial rub. Abdomen: Soft, nontender, no hepatomegaly, bowel sounds present, no guarding or rebound. Extremities: No pitting edema, distal pulses 2+. Skin: Warm and dry. Musculoskeletal: No kyphosis. Neuropsychiatric: Alert and oriented x3, affect grossly appropriate.  ECG: NSR  Recent Labwork: 11/02/2022: Magnesium 2.2 04/29/2023: BNP 321.6 07/14/2023: ALT 34; AST 33; BUN 28; Creatinine, Ser 0.98; Hemoglobin 14.6; Platelets 216; Potassium 3.7; Sodium 138     Component Value Date/Time   CHOL 141 03/26/2023 0946   TRIG 113 03/26/2023 0946   HDL 40 03/26/2023 0946   CHOLHDL 3.5 03/26/2023 0946   CHOLHDL 2.9 03/16/2014 1219   VLDL 12 03/16/2014 1219   LDLCALC 80 03/26/2023 0946      Assessment and Plan:  CAD manifested by SIHD: Ongoing DOE x 2 years but worsened in the last 1 year, not able to walk even 50 feet.   Worsening orthopnea as well.  No angina except for 1 episode around 3 weeks ago.  CT cardiac showed multivessel CAD, flow-limiting lesions in the mid LCx and distal RCA.  LM assessment was limited on FFR.  Not on aspirin due to  Eliquis use, continue atorvastatin 80 mg nightly.  He will benefit from invasive ischemia evaluation with LHC and RHC due to multivessel CAD with flow-limiting lesions in LCx/RCA on cardiac CT and worsening DOE/orthopnea.  He is scheduled for LHC/RHC on 08/18/2023 with Dr. Clifton James.  Okay to hold Eliquis 2 days prior to the procedure.  Informed consent for LHC and RHC Risks and benefits of cardiac catheterization have been discussed with the patient.  These include bleeding, infection, kidney damage, stroke, heart attack, death.  The patient understands these risks and is willing to proceed.  Persistent A-fib: EKG today showed atrial fibrillation, rate controlled.  He underwent PVI in 2011 with no recurrence until recently.  Continue metoprolol succinate 25 mg twice daily and Eliquis 5 mg twice daily.  Mild aortic valve stenosis: Echocardiogram from 2023 and 2025 showed mild aortic valve stenosis.  HTN, controlled: Continue current antihypertensives.  Amlodipine 10 mg once daily, metoprolol succinate 25 mg twice daily, ramipril 10 mg twice daily and torsemide 10 mg once daily.  HTN management per PCP.  HLD, partially at goal: LDL 80 in 2024.  Currently atorvastatin 80 mg nightly which I will continue.  Goal LDL less than 70.    Medication Adjustments/Labs and Tests Ordered: Current medicines are reviewed at length with the patient today.  Concerns regarding medicines are outlined above.   Tests Ordered: Orders Placed This Encounter  Procedures   EKG 12-Lead    Medication Changes: No orders of the defined types were placed in this encounter.   Disposition:  Follow up 1 month after LHC  Signed, Emonii Wienke Verne Spurr, MD, 08/12/2023 10:16 AM    [facility] [address], [place], [place] [address]

## 2023-08-13 NOTE — Progress Notes (Signed)
 [name], male    DOB: [birth year]    MRN: [id number]   Brief patient profile:  60  yowm  quit smoking 1970  referred to pulmonary clinic in [place]  08/16/2023 by Ronie Spies for  doe onset around 2023      History of Present Illness  08/16/2023  Pulmonary/ 1st office eval/ Abigayle Wilinski / [facility]  Chief Complaint  Patient presents with   Establish Care   Shortness of Breath  Dyspnea:  100 ft slt uphill s classic angina  Cough: none  Sleep: now sleeps raised up with 2 pillows a wedge where use to sleep flap / prefers L side down/ but still ends up in recliner   SABA use: none  02: none  Needing lozenges in church and having problem with allergies "first time in my life this spring"     No obvious day to day or daytime pattern/variability or assoc excess/ purulent sputum or mucus plugs or hemoptysis or cp or chest tightness, subjective wheeze or overt sinus or hb symptoms.    Also denies any obvious fluctuation of symptoms with weather or environmental changes or other aggravating or alleviating factors except as outlined above   No unusual exposure hx or h/o childhood pna/ asthma or knowledge of premature birth.  Current Allergies, Complete Past Medical History, Past Surgical History, Family History, and Social History were reviewed in [employer] record.  ROS  The following are not active complaints unless bolded Hoarseness, sore throat, dysphagia, dental problems, itching, sneezing,  nasal congestion or discharge of excess mucus or purulent secretions, ear ache,   fever, chills, sweats, unintended wt loss or wt gain, classically pleuritic or exertional cp,  orthopnea pnd or arm/hand swelling  or leg swelling, presyncope, palpitations, abdominal pain, anorexia, nausea, vomiting, diarrhea  or change in bowel habits or change in bladder habits, change in stools or change in urine, dysuria, hematuria,  rash, arthralgias, visual complaints, headache,  numbness, weakness or ataxia or problems with walking/ uses cane  or coordination, change in mood or  memory.            Outpatient Medications Prior to Visit  Medication Sig Dispense Refill   amLODipine (NORVASC) 10 MG tablet TAKE 1 TABLET BY MOUTH EVERY DAY 90 tablet 1   diphenhydramine-acetaminophen (TYLENOL PM) 25-500 MG TABS tablet Take 2 tablets by mouth at bedtime.     ELIQUIS 5 MG TABS tablet TAKE 1 TABLET BY MOUTH TWICE A DAY. STOP PRADAXA (Patient taking differently: Take 5 mg by mouth 2 (two) times daily.) 60 tablet 5   indapamide (LOZOL) 1.25 MG tablet TAKE 1 TABLET BY MOUTH DAILY. 90 tablet 1   latanoprost (XALATAN) 0.005 % ophthalmic solution Place 1 drop into both eyes at bedtime.     metoprolol succinate (TOPROL-XL) 25 MG 24 hr tablet TAKE 1 TABLET BY MOUTH TWICE A DAY 180 tablet 2   Multiple Vitamin (MULTIVITAMIN WITH MINERALS) TABS tablet Take 1 tablet by mouth 3 (three) times a week.     Oxycodone HCl 10 MG TABS 1 taken 4 times daily as needed for pain 120 tablet 0   pantoprazole (PROTONIX) 40 MG tablet Take 1 tablet (40 mg total) by mouth 2 (two) times daily. (Patient taking differently: Take 40 mg by mouth See admin instructions. Take 40 mg by mouth daily in the morning and every other evening) 180 tablet 1   pravastatin (PRAVACHOL) 80 MG tablet TAKE 1 TABLET BY MOUTH EVERYDAY  AT BEDTIME 90 tablet 1   psyllium (METAMUCIL SMOOTH TEXTURE) 58.6 % powder Take 1 packet by mouth at bedtime.     ramipril (ALTACE) 10 MG capsule TAKE 1 CAPSULE BY MOUTH 2 TIMES DAILY. 180 capsule 1   sucralfate (CARAFATE) 1 g tablet CRUSH MIX IN WATER USE THREE TIMES DAY FOR 30 DAYS 270 tablet 1   tamsulosin (FLOMAX) 0.4 MG CAPS capsule Take 1 capsule (0.4 mg total) by mouth 2 (two) times daily. 180 capsule 3   torsemide (DEMADEX) 10 MG tablet Take 1 tablet (10 mg total) by mouth daily. 30 tablet 5   XDEMVY 0.25 % SOLN Place 1 drop into both eyes daily as needed (irritation/inflammation).      Oxycodone HCl 10 MG TABS 1 taken 4 times daily as needed pain (Patient not taking: Reported on 08/16/2023) 120 tablet 0   Oxycodone HCl 10 MG TABS 1 tablet taken 3 or 4 times daily as needed for pain caution drowsiness (Patient not taking: Reported on 08/16/2023) 120 tablet 0   No facility-administered medications prior to visit.    Past Medical History:  Diagnosis Date   Arthritis    Cancer (HCC)    skin   Carpal tunnel syndrome    Cataract    left eye   Chronic back pain    multiple back surgeries   Complication of anesthesia    pt states woke up during hemorroid surgery and stopped breathing during his back surgery   DVT (deep venous thrombosis) (HCC) 03/2007   left leg    Dysrhythmia    GERD (gastroesophageal reflux disease)    takes Protonix daily   Glaucoma    History of colon polyps    History of hyperglycemia    History of MRSA infection 1992   HTN (hypertension)    takes  Ramipril daily   Hyperlipidemia    takes Pravastatin 3 times a week   Nocturia    Paroxysmal atrial fibrillation (HCC)    takes Pradaxa daily, Chads2vasc score 2(07/11/14)   Pneumonia    many yrs ago   Pulmonary embolism (HCC) 2008      Objective:     BP 105/63   Pulse 73   Ht 5\' 6"  (1.676 m)   Wt 189 lb (85.7 kg)   SpO2 97%   BMI 30.51 kg/m   SpO2: 97 % RA  Amb wm nad slt hoarse with occ throat clearing / dry cough " must be my allergies"    HEENT : Oropharynx  clear      Nasal turbinates nl    NECK :  without  apparent JVD/ palpable Nodes/TM    LUNGS: no acc muscle use,  Nl contour chest which is clear to A and P bilaterally without cough on insp or exp maneuvers   CV:  RRR  no s3 or murmur or increase in P2, and no edema   ABD:  soft and nontender   MS:  Gait slow/ walks with cane   ext warm without deformities Or obvious joint restrictions  calf tenderness, cyanosis or clubbing    SKIN: warm and dry without lesions    NEURO:  alert, approp, nl sensorium with  no  motor or cerebellar deficits apparent.    I personally reviewed images and agree with radiology impression as follows:  CXR:   pa and lateral  04/29/23  No significant interval change.  Persistent elevation of the right hemidiaphragm since at least 2016   CTa  07/14/23  1. No evidence for pulmonary embolism. 2. Cardiomegaly. 3. Stable peripheral interstitial opacities in the lung bases. 4. Stable mediastinal and right hilar lymphadenopathy. 5. Stable air trapping throughout both lungs. Findings are compatible with small airways disease. 6. Aortic atherosclerosis.      Assessment   DOE (dyspnea on exertion) Onset 2022-2023  - Echo 06/11/23   1. Left ventricular ejection fraction, by estimation, is 60 to 65%. The  left ventricle has normal function. The left ventricle has no regional  wall motion abnormalities. There is moderate left ventricular hypertrophy.  Left ventricular diastolic function   could not be evaluated.   2. Right ventricular systolic function is low normal. The right  ventricular size is mildly enlarged. Tricuspid regurgitation signal is  inadequate for assessing PA pressure.   3. Left atrial size was severely dilated.   4. Right atrial size was severely dilated.   5. The mitral valve is normal in structure. Mild mitral valve  regurgitation. No evidence of mitral stenosis.   6. The aortic valve has an indeterminant number of cusps. There is  moderate calcification of the aortic valve. Aortic valve regurgitation is  trivial. Mild aortic valve stenosis.   7. The inferior vena cava is normal in size with greater than 50%  respiratory variability, suggesting right atrial pressure of 3 mmHg.  - 08/16/2023   Walked on RA  x  2  lap(s) =  approx 300  ft  @ slow/ cane pace, stopped due to fatigue > sob  with lowest 02 sats 96%   - Coronary arterial dz by non-invasive studies/ for heart cath 08/18/23    When respiratory symptoms begin or become refractory well after a  patient reports complete smoking cessation,  Especially when this wasn't the case while they were smoking, a red flag is raised based on the work of Dr Primitivo Gauze which states: if you quit smoking when your best day FEV1 is still well preserved it is highly unlikely you will progress to severe disease.  That is to say, once the smoking stops,  the symptoms should not suddenly erupt or markedly worsen.  If so, the differential diagnosis should include  obesity/deconditioning,  LPR/Reflux/Aspiration syndromes,  occult CHF, or  especially side effect of medications commonly used in this population (esp ACEi).  "Allergies" to which he attributes his cough, throat drainage, have never bothered him until now.   Since he is going to have his cath on 08/18/23 with possible stents, would rec he proceed with this and then see if breathing better and if not then change altace to valsartan for 6 weeks  (see hbp) and f/u prn p this trial (or return here for the substitution     HYPERTENSION, BENIGN In the best review of chronic cough to date ( NEJM 2016 375 1544-1551) ,  ACEi are now felt to cause cough in up to  20% of pts which is a 4 fold increase from previous reports and does not include the variety of non-specific complaints we see in pulmonary clinic in pts on ACEi but previously attributed to another dx like  Copd/asthma and  include PNDS, throat congestion, "bronchitis", unexplained dyspnea and noct "strangling" sensations, and hoarseness, but also  atypical /refractory GERD symptoms like dysphagia and "bad heartburn"   The only way I know  to prove this is not an "ACEi Case" is a trial off ACEi x a minimum of 6 weeks then regroup.   Rec change altace to valsartan  x 6 weeks and return to this clinic if not better        Each maintenance medication was reviewed in detail including emphasizing most importantly the difference between maintenance and prns and under what circumstances the prns are to be  triggered using an action plan format where appropriate.  Total time for H and P, chart review, counseling,  directly observing portions of ambulatory 02 saturation study/ and generating customized AVS unique to this office visit / same day charting = 50 min new pt eval for > 30 min for multiple  refractory respiratory  symptoms of uncertain etiology                  Sandrea Hughs, MD 08/16/2023

## 2023-08-16 ENCOUNTER — Other Ambulatory Visit (HOSPITAL_COMMUNITY)
Admission: RE | Admit: 2023-08-16 | Discharge: 2023-08-16 | Disposition: A | Discharge status: Home / Self Care | Source: Ambulatory Visit | Attending: Internal Medicine | Admitting: Internal Medicine

## 2023-08-16 ENCOUNTER — Encounter: Payer: Self-pay | Admitting: Internal Medicine

## 2023-08-16 ENCOUNTER — Ambulatory Visit: Payer: PPO | Admitting: Internal Medicine

## 2023-08-16 VITALS — BP 105/63 | HR 73 | Ht 66.0 in | Wt 189.0 lb

## 2023-08-16 DIAGNOSIS — I10 Essential (primary) hypertension: Secondary | ICD-10-CM | POA: Diagnosis not present

## 2023-08-16 DIAGNOSIS — R0609 Other forms of dyspnea: Secondary | ICD-10-CM | POA: Diagnosis not present

## 2023-08-16 DIAGNOSIS — Z79899 Other long term (current) drug therapy: Secondary | ICD-10-CM | POA: Diagnosis not present

## 2023-08-16 DIAGNOSIS — Z87891 Personal history of nicotine dependence: Secondary | ICD-10-CM

## 2023-08-16 DIAGNOSIS — M4714 Other spondylosis with myelopathy, thoracic region: Secondary | ICD-10-CM | POA: Insufficient documentation

## 2023-08-16 LAB — BASIC METABOLIC PANEL WITH GFR
Anion gap: 11 (ref 5–15)
BUN: 21 mg/dL (ref 8–23)
CO2: 27 mmol/L (ref 22–32)
Calcium: 9.4 mg/dL (ref 8.9–10.3)
Chloride: 100 mmol/L (ref 98–111)
Creatinine, Ser: 1.23 mg/dL (ref 0.61–1.24)
GFR, Estimated: 59 mL/min — ABNORMAL LOW (ref >60)
Glucose, Bld: 121 mg/dL — ABNORMAL HIGH (ref 70–99)
Potassium: 4.4 mmol/L (ref 3.5–5.1)
Sodium: 138 mmol/L (ref 135–145)

## 2023-08-16 NOTE — Patient Instructions (Addendum)
 If you don't breathe better after your procedure, I need to see you back here to consider substituting Valsartan in place of altace (or your cardiologist can do this)   Pulmonary follow up is as needed

## 2023-08-16 NOTE — Assessment & Plan Note (Addendum)
 Onset 2022-2023  - Echo 06/11/23   1. Left ventricular ejection fraction, by estimation, is 60 to 65%. The  left ventricle has normal function. The left ventricle has no regional  wall motion abnormalities. There is moderate left ventricular hypertrophy.  Left ventricular diastolic function   could not be evaluated.   2. Right ventricular systolic function is low normal. The right  ventricular size is mildly enlarged. Tricuspid regurgitation signal is  inadequate for assessing PA pressure.   3. Left atrial size was severely dilated.   4. Right atrial size was severely dilated.   5. The mitral valve is normal in structure. Mild mitral valve  regurgitation. No evidence of mitral stenosis.   6. The aortic valve has an indeterminant number of cusps. There is  moderate calcification of the aortic valve. Aortic valve regurgitation is  trivial. Mild aortic valve stenosis.   7. The inferior vena cava is normal in size with greater than 50%  respiratory variability, suggesting right atrial pressure of 3 mmHg.  - 08/16/2023   Walked on RA  x  2  lap(s) =  approx 300  ft  @ slow/ cane pace, stopped due to fatigue > sob  with lowest 02 sats 96%   - Coronary arterial dz by non-invasive studies/ for heart cath 08/18/23    When respiratory symptoms begin or become refractory well after a patient reports complete smoking cessation,  Especially when this wasn't the case while they were smoking, a red flag is raised based on the work of Dr Primitivo Gauze which states: if you quit smoking when your best day FEV1 is still well preserved it is highly unlikely you will progress to severe disease.  That is to say, once the smoking stops,  the symptoms should not suddenly erupt or markedly worsen.  If so, the differential diagnosis should include  obesity/deconditioning,  LPR/Reflux/Aspiration syndromes,  occult CHF, or  especially side effect of medications commonly used in this population (esp ACEi).  "Allergies" to  which he attributes his cough, throat drainage, have never bothered him until now.   Since he is going to have his cath on 08/18/23 with possible stents, would rec he proceed with this and then see if breathing better and if not then change altace to valsartan for 6 weeks  (see hbp) and f/u prn p this trial (or return here for the substitution

## 2023-08-16 NOTE — Assessment & Plan Note (Signed)
 In the best review of chronic cough to date ( NEJM 2016 375 [id number]) ,  ACEi are now felt to cause cough in up to  20% of pts which is a 4 fold increase from previous reports and does not include the variety of non-specific complaints we see in pulmonary clinic in pts on ACEi but previously attributed to another dx like  Copd/asthma and  include PNDS, throat congestion, "bronchitis", unexplained dyspnea and noct "strangling" sensations, and hoarseness, but also  atypical /refractory GERD symptoms like dysphagia and "bad heartburn"   The only way I know  to prove this is not an "ACEi Case" is a trial off ACEi x a minimum of 6 weeks then regroup.   Rec change altace to valsartan x 6 weeks and return to this clinic if not better        Each maintenance medication was reviewed in detail including emphasizing most importantly the difference between maintenance and prns and under what circumstances the prns are to be triggered using an action plan format where appropriate.  Total time for H and P, chart review, counseling,  directly observing portions of ambulatory 02 saturation study/ and generating customized AVS unique to this office visit / same day charting = 50 min new pt eval for > 30 min for multiple  refractory respiratory  symptoms of uncertain etiology

## 2023-08-17 ENCOUNTER — Telehealth: Payer: Self-pay | Admitting: *Deleted

## 2023-08-17 NOTE — Telephone Encounter (Signed)
 Duplicate encounter

## 2023-08-17 NOTE — Telephone Encounter (Signed)
 Cardiac Catheterization scheduled at [facility] for: Wednesday August 18, 2023 8:30 AM Arrival time [facility] Main Entrance A at: 6:30 AM  Nothing to eat after midnight prior to procedure, clear liquids until 5 AM day of procedure.   Medication instructions: -Hold:  Eliquis-none 08/16/23 until post procedure  Altace/Lozol-AM of procedure-GFR 59 -Other usual morning medications can be taken with sips of water including aspirin 81 mg.  Plan to go home the same day, you will only stay overnight if medically necessary.  You must have responsible adult to drive you home.  Someone must be with you the first 24 hours after you arrive home.  Reviewed procedure instruction with patient.

## 2023-08-18 ENCOUNTER — Encounter (HOSPITAL_COMMUNITY)
Admission: RE | Disposition: A | Discharge status: Home / Self Care | Payer: Self-pay | Source: Home / Self Care | Attending: Cardiovascular Disease

## 2023-08-18 ENCOUNTER — Other Ambulatory Visit: Payer: Self-pay

## 2023-08-18 ENCOUNTER — Ambulatory Visit: Payer: PPO | Admitting: Internal Medicine

## 2023-08-18 ENCOUNTER — Other Ambulatory Visit (HOSPITAL_COMMUNITY): Payer: Self-pay

## 2023-08-18 ENCOUNTER — Ambulatory Visit (HOSPITAL_COMMUNITY)
Admission: RE | Admit: 2023-08-18 | Discharge: 2023-08-18 | Disposition: A | Discharge status: Home / Self Care | Attending: Cardiovascular Disease | Admitting: Cardiovascular Disease

## 2023-08-18 DIAGNOSIS — Z79899 Other long term (current) drug therapy: Secondary | ICD-10-CM | POA: Insufficient documentation

## 2023-08-18 DIAGNOSIS — Z86718 Personal history of other venous thrombosis and embolism: Secondary | ICD-10-CM | POA: Insufficient documentation

## 2023-08-18 DIAGNOSIS — D6869 Other thrombophilia: Secondary | ICD-10-CM | POA: Diagnosis not present

## 2023-08-18 DIAGNOSIS — I10 Essential (primary) hypertension: Secondary | ICD-10-CM | POA: Diagnosis not present

## 2023-08-18 DIAGNOSIS — G8929 Other chronic pain: Secondary | ICD-10-CM | POA: Diagnosis not present

## 2023-08-18 DIAGNOSIS — Z86711 Personal history of pulmonary embolism: Secondary | ICD-10-CM | POA: Insufficient documentation

## 2023-08-18 DIAGNOSIS — I350 Nonrheumatic aortic (valve) stenosis: Secondary | ICD-10-CM | POA: Diagnosis not present

## 2023-08-18 DIAGNOSIS — E7849 Other hyperlipidemia: Secondary | ICD-10-CM

## 2023-08-18 DIAGNOSIS — I25110 Atherosclerotic heart disease of native coronary artery with unstable angina pectoris: Secondary | ICD-10-CM | POA: Diagnosis not present

## 2023-08-18 DIAGNOSIS — Z955 Presence of coronary angioplasty implant and graft: Secondary | ICD-10-CM

## 2023-08-18 DIAGNOSIS — I2584 Coronary atherosclerosis due to calcified coronary lesion: Secondary | ICD-10-CM | POA: Diagnosis not present

## 2023-08-18 DIAGNOSIS — Z87891 Personal history of nicotine dependence: Secondary | ICD-10-CM | POA: Diagnosis not present

## 2023-08-18 DIAGNOSIS — Z7901 Long term (current) use of anticoagulants: Secondary | ICD-10-CM | POA: Diagnosis not present

## 2023-08-18 DIAGNOSIS — I4819 Other persistent atrial fibrillation: Secondary | ICD-10-CM | POA: Diagnosis not present

## 2023-08-18 DIAGNOSIS — Z7902 Long term (current) use of antithrombotics/antiplatelets: Secondary | ICD-10-CM | POA: Diagnosis not present

## 2023-08-18 DIAGNOSIS — I2510 Atherosclerotic heart disease of native coronary artery without angina pectoris: Secondary | ICD-10-CM | POA: Diagnosis present

## 2023-08-18 DIAGNOSIS — E785 Hyperlipidemia, unspecified: Secondary | ICD-10-CM | POA: Diagnosis not present

## 2023-08-18 HISTORY — PX: CORONARY STENT INTERVENTION: CATH118234

## 2023-08-18 HISTORY — PX: RIGHT/LEFT HEART CATH AND CORONARY ANGIOGRAPHY: CATH118266

## 2023-08-18 LAB — POCT I-STAT EG7
Acid-Base Excess: 1 mmol/L (ref 0.0–2.0)
Acid-base deficit: 1 mmol/L (ref 0.0–2.0)
Bicarbonate: 24.4 mmol/L (ref 20.0–28.0)
Bicarbonate: 25.8 mmol/L (ref 20.0–28.0)
Calcium, Ion: 1.04 mmol/L — ABNORMAL LOW (ref 1.15–1.40)
Calcium, Ion: 1.13 mmol/L — ABNORMAL LOW (ref 1.15–1.40)
HCT: 36 % — ABNORMAL LOW (ref 39.0–52.0)
HCT: 38 % — ABNORMAL LOW (ref 39.0–52.0)
Hemoglobin: 12.2 g/dL — ABNORMAL LOW (ref 13.0–17.0)
Hemoglobin: 12.9 g/dL — ABNORMAL LOW (ref 13.0–17.0)
O2 Saturation: 64 %
O2 Saturation: 66 %
Potassium: 3.4 mmol/L — ABNORMAL LOW (ref 3.5–5.1)
Potassium: 3.7 mmol/L (ref 3.5–5.1)
Sodium: 138 mmol/L (ref 135–145)
Sodium: 140 mmol/L (ref 135–145)
TCO2: 26 mmol/L (ref 22–32)
TCO2: 27 mmol/L (ref 22–32)
pCO2, Ven: 40.7 mmHg — ABNORMAL LOW (ref 44–60)
pCO2, Ven: 41.8 mmHg — ABNORMAL LOW (ref 44–60)
pH, Ven: 7.386 (ref 7.25–7.43)
pH, Ven: 7.397 (ref 7.25–7.43)
pO2, Ven: 34 mmHg (ref 32–45)
pO2, Ven: 34 mmHg (ref 32–45)

## 2023-08-18 LAB — POCT I-STAT 7, (LYTES, BLD GAS, ICA,H+H)
Acid-base deficit: 1 mmol/L (ref 0.0–2.0)
Bicarbonate: 22.3 mmol/L (ref 20.0–28.0)
Calcium, Ion: 1.16 mmol/L (ref 1.15–1.40)
HCT: 38 % — ABNORMAL LOW (ref 39.0–52.0)
Hemoglobin: 12.9 g/dL — ABNORMAL LOW (ref 13.0–17.0)
O2 Saturation: 95 %
Potassium: 3.7 mmol/L (ref 3.5–5.1)
Sodium: 137 mmol/L (ref 135–145)
TCO2: 23 mmol/L (ref 22–32)
pCO2 arterial: 31.7 mmHg — ABNORMAL LOW (ref 32–48)
pH, Arterial: 7.456 — ABNORMAL HIGH (ref 7.35–7.45)
pO2, Arterial: 70 mmHg — ABNORMAL LOW (ref 83–108)

## 2023-08-18 LAB — POCT ACTIVATED CLOTTING TIME
Activated Clotting Time: 262 s
Activated Clotting Time: 268 s
Activated Clotting Time: 187 s

## 2023-08-18 SURGERY — RIGHT/LEFT HEART CATH AND CORONARY ANGIOGRAPHY
Anesthesia: LOCAL

## 2023-08-18 MED ORDER — ASPIRIN 81 MG PO CHEW: 81.0000 mg | CHEWABLE_TABLET | ORAL | Status: DC

## 2023-08-18 MED ORDER — OXYCODONE HCL 5 MG PO TABS
ORAL_TABLET | ORAL | Status: AC
  Filled 2023-08-18: qty 2

## 2023-08-18 MED ORDER — FENTANYL CITRATE (PF) 100 MCG/2ML IJ SOLN
INTRAMUSCULAR | Status: DC | PRN
  Administered 2023-08-18: 50 ug via INTRAVENOUS
  Administered 2023-08-18 (×2): 25 ug via INTRAVENOUS

## 2023-08-18 MED ORDER — LIDOCAINE HCL (PF) 1 % IJ SOLN
INTRAMUSCULAR | Status: AC
Start: 2023-08-18 — End: ?
  Filled 2023-08-18: qty 30

## 2023-08-18 MED ORDER — PANTOPRAZOLE SODIUM 40 MG PO TBEC: 40.0000 mg | DELAYED_RELEASE_TABLET | ORAL | Status: DC

## 2023-08-18 MED ORDER — ACETAMINOPHEN 325 MG PO TABS
ORAL_TABLET | ORAL | Status: AC
  Filled 2023-08-18: qty 2

## 2023-08-18 MED ORDER — MIDAZOLAM HCL 2 MG/2ML IJ SOLN
INTRAMUSCULAR | Status: DC | PRN
  Administered 2023-08-18 (×2): 1 mg via INTRAVENOUS

## 2023-08-18 MED ORDER — ONDANSETRON HCL 4 MG/2ML IJ SOLN: 4.0000 mg | Freq: Four times a day (QID) | INTRAMUSCULAR | Status: DC | PRN

## 2023-08-18 MED ORDER — SODIUM CHLORIDE 0.9 % IV SOLN: 250.0000 mL | INTRAVENOUS | Status: DC | PRN

## 2023-08-18 MED ORDER — LABETALOL HCL 5 MG/ML IV SOLN: 10.0000 mg | INTRAVENOUS | Status: DC | PRN

## 2023-08-18 MED ORDER — ACETAMINOPHEN 325 MG PO TABS
650.0000 mg | ORAL_TABLET | ORAL | Status: DC | PRN
  Administered 2023-08-18: 650 mg via ORAL

## 2023-08-18 MED ORDER — ACETAMINOPHEN 325 MG PO TABS: 650.0000 mg | ORAL_TABLET | ORAL | Status: DC | PRN

## 2023-08-18 MED ORDER — ASPIRIN 81 MG PO TBEC
81.0000 mg | DELAYED_RELEASE_TABLET | Freq: Every day | ORAL | 0 refills | Status: AC
  Filled 2023-08-18: qty 30, 30d supply, fill #0

## 2023-08-18 MED ORDER — FENTANYL CITRATE (PF) 100 MCG/2ML IJ SOLN
INTRAMUSCULAR | Status: AC
  Filled 2023-08-18: qty 2

## 2023-08-18 MED ORDER — CLOPIDOGREL BISULFATE 75 MG PO TABS: 75.0000 mg | ORAL_TABLET | Freq: Every day | ORAL | Status: DC

## 2023-08-18 MED ORDER — APIXABAN 5 MG PO TABS: 5.0000 mg | ORAL_TABLET | Freq: Two times a day (BID) | ORAL | Status: DC

## 2023-08-18 MED ORDER — IOHEXOL 350 MG/ML SOLN
INTRAVENOUS | Status: DC | PRN
  Administered 2023-08-18: 110 mL

## 2023-08-18 MED ORDER — VERAPAMIL HCL 2.5 MG/ML IV SOLN
INTRAVENOUS | Status: DC | PRN
  Administered 2023-08-18: 10 mL via INTRA_ARTERIAL

## 2023-08-18 MED ORDER — CLOPIDOGREL BISULFATE 75 MG PO TABS
75.0000 mg | ORAL_TABLET | Freq: Every day | ORAL | 3 refills | Status: DC
  Filled 2023-08-18: qty 90, 90d supply, fill #0

## 2023-08-18 MED ORDER — HYDRALAZINE HCL 20 MG/ML IJ SOLN: 10.0000 mg | INTRAMUSCULAR | Status: AC | PRN

## 2023-08-18 MED ORDER — HEPARIN SODIUM (PORCINE) 1000 UNIT/ML IJ SOLN
INTRAMUSCULAR | Status: AC
  Filled 2023-08-18: qty 10

## 2023-08-18 MED ORDER — HEPARIN SODIUM (PORCINE) 1000 UNIT/ML IJ SOLN
INTRAMUSCULAR | Status: DC | PRN
  Administered 2023-08-18: 6500 [IU] via INTRAVENOUS
  Administered 2023-08-18: 4500 [IU] via INTRAVENOUS
  Administered 2023-08-18: 2000 [IU] via INTRAVENOUS

## 2023-08-18 MED ORDER — SODIUM CHLORIDE 0.9% FLUSH: 3.0000 mL | INTRAVENOUS | Status: DC | PRN

## 2023-08-18 MED ORDER — INDAPAMIDE 1.25 MG PO TABS: 1.2500 mg | ORAL_TABLET | Freq: Every day | ORAL | Status: DC

## 2023-08-18 MED ORDER — MIDAZOLAM HCL 2 MG/2ML IJ SOLN
INTRAMUSCULAR | Status: AC
  Filled 2023-08-18: qty 2

## 2023-08-18 MED ORDER — VERAPAMIL HCL 2.5 MG/ML IV SOLN
INTRAVENOUS | Status: AC
  Filled 2023-08-18: qty 2

## 2023-08-18 MED ORDER — SODIUM CHLORIDE 0.9% FLUSH: 3.0000 mL | Freq: Two times a day (BID) | INTRAVENOUS | Status: DC

## 2023-08-18 MED ORDER — TAMSULOSIN HCL 0.4 MG PO CAPS: 0.4000 mg | ORAL_CAPSULE | Freq: Two times a day (BID) | ORAL | Status: DC

## 2023-08-18 MED ORDER — OXYCODONE HCL 5 MG PO TABS
10.0000 mg | ORAL_TABLET | Freq: Four times a day (QID) | ORAL | Status: DC | PRN
  Administered 2023-08-18: 10 mg via ORAL

## 2023-08-18 MED ORDER — RAMIPRIL 10 MG PO CAPS: 10.0000 mg | ORAL_CAPSULE | Freq: Two times a day (BID) | ORAL | Status: DC

## 2023-08-18 MED ORDER — HEPARIN (PORCINE) IN NACL 1000-0.9 UT/500ML-% IV SOLN
INTRAVENOUS | Status: DC | PRN
  Administered 2023-08-18 (×2): 500 mL

## 2023-08-18 MED ORDER — ASPIRIN 81 MG PO CHEW: 81.0000 mg | CHEWABLE_TABLET | Freq: Every day | ORAL | Status: DC

## 2023-08-18 MED ORDER — NITROGLYCERIN 1 MG/10 ML FOR IR/CATH LAB
INTRA_ARTERIAL | Status: AC
  Filled 2023-08-18: qty 10

## 2023-08-18 MED ORDER — SODIUM CHLORIDE 0.9 % IV SOLN: INTRAVENOUS | Status: AC

## 2023-08-18 MED ORDER — LIDOCAINE HCL (PF) 1 % IJ SOLN
INTRAMUSCULAR | Status: DC | PRN
  Administered 2023-08-18 (×2): 5 mL

## 2023-08-18 MED ORDER — AMLODIPINE BESYLATE 5 MG PO TABS: 10.0000 mg | ORAL_TABLET | Freq: Every day | ORAL | Status: DC

## 2023-08-18 MED ORDER — FAMOTIDINE IN NACL 20-0.9 MG/50ML-% IV SOLN
INTRAVENOUS | Status: DC | PRN
  Administered 2023-08-18: 20 mg via INTRAVENOUS

## 2023-08-18 MED ORDER — METOPROLOL SUCCINATE ER 25 MG PO TB24: 25.0000 mg | ORAL_TABLET | Freq: Two times a day (BID) | ORAL | Status: DC

## 2023-08-18 MED ORDER — CLOPIDOGREL BISULFATE 300 MG PO TABS
ORAL_TABLET | ORAL | Status: DC | PRN
  Administered 2023-08-18: 600 mg via ORAL

## 2023-08-18 MED ORDER — SODIUM CHLORIDE 0.9 % WEIGHT BASED INFUSION: 3.0000 mL/kg/h | INTRAVENOUS | Status: AC

## 2023-08-18 MED ORDER — CLOPIDOGREL BISULFATE 300 MG PO TABS
ORAL_TABLET | ORAL | Status: AC
  Filled 2023-08-18: qty 2

## 2023-08-18 MED ORDER — FAMOTIDINE IN NACL 20-0.9 MG/50ML-% IV SOLN
INTRAVENOUS | Status: AC
  Filled 2023-08-18: qty 50

## 2023-08-18 MED ORDER — NITROGLYCERIN 0.4 MG SL SUBL
0.4000 mg | SUBLINGUAL_TABLET | SUBLINGUAL | 3 refills | Status: AC | PRN
  Filled 2023-08-18: qty 25, 5d supply, fill #0

## 2023-08-18 MED ORDER — LATANOPROST 0.005 % OP SOLN: 1.0000 [drp] | Freq: Every day | OPHTHALMIC | Status: DC

## 2023-08-18 MED ORDER — PRAVASTATIN SODIUM 80 MG PO TABS: 80.0000 mg | ORAL_TABLET | Freq: Every day | ORAL | Status: DC

## 2023-08-18 MED ORDER — SODIUM CHLORIDE 0.9 % WEIGHT BASED INFUSION: 1.0000 mL/kg/h | INTRAVENOUS | Status: DC

## 2023-08-18 SURGICAL SUPPLY — 25 items
BALL SAPPHIRE NC24 3.0X15 (BALLOONS) ×1 IMPLANT
BALLN EMERGE MR 2.0X12 (BALLOONS) ×1 IMPLANT
BALLOON EMERGE MR 2.0X12 (BALLOONS) IMPLANT
BALLOON SAPPHIRE NC24 3.0X15 (BALLOONS) IMPLANT
CATH 5FR JL3.5 JR4 ANG PIG MP (CATHETERS) IMPLANT
CATH BALLN WEDGE 5F 110CM (CATHETERS) IMPLANT
CATH SWAN GANZ 7F STRAIGHT (CATHETERS) IMPLANT
CATH VISTA GUIDE 6FR XB3.5 EPK (CATHETERS) IMPLANT
DEVICE RAD COMP TR BAND LRG (VASCULAR PRODUCTS) IMPLANT
GLIDESHEATH SLEND SS 6F .021 (SHEATH) IMPLANT
GUIDEWIRE .025 260CM (WIRE) IMPLANT
GUIDEWIRE INQWIRE 1.5J.035X260 (WIRE) IMPLANT
INQWIRE 1.5J .035X260CM (WIRE) ×1 IMPLANT
KIT ENCORE 26 ADVANTAGE (KITS) IMPLANT
KIT MICROPUNCTURE NIT STIFF (SHEATH) IMPLANT
KIT SYRINGE INJ CVI SPIKEX1 (MISCELLANEOUS) IMPLANT
PACK CARDIAC CATHETERIZATION (CUSTOM PROCEDURE TRAY) ×1 IMPLANT
SET ATX-X65L (MISCELLANEOUS) IMPLANT
SHEATH 6FR 85 DEST SLENDER (SHEATH) IMPLANT
SHEATH GLIDE SLENDER 4/5FR (SHEATH) IMPLANT
SHEATH PINNACLE 7F 10CM (SHEATH) IMPLANT
SHEATH PROBE COVER 6X72 (BAG) IMPLANT
STENT SYNERGY XD 2.75X20 (Permanent Stent) IMPLANT
SYNERGY XD 2.75X20 (Permanent Stent) ×1 IMPLANT
WIRE ASAHI PROWATER 180CM (WIRE) IMPLANT

## 2023-08-18 NOTE — Progress Notes (Signed)
 Site area: rt groin femoral venous sheath Site Prior to Removal:  Level 0 Pressure Applied For: 15 minutes Manual:   yes Patient Status During Pull:  stable Post Pull Site:  Level 0 Post Pull Instructions Given:  yes Post Pull Pulses Present: rt pt pulse palpable Dressing Applied:  gauze and tegaderm Bedrest begins @ 1240 Comments:

## 2023-08-18 NOTE — Progress Notes (Signed)
 Discussed with pt and daughter stent, restrictions, Plavix importance, exercise, NTG and CRPII. Pt receptive. Will refer to [facility] CRPII.  [id number] Ethelda Chick BS, ACSM-CEP 08/18/2023 2:22 PM

## 2023-08-18 NOTE — Discharge Instructions (Signed)
Drink plenty of fluids for 48 hours and keep wrist elevated at heart level for 24 hours  Radial Site Care   This sheet gives you information about how to care for yourself after your procedure. Your health care provider may also give you more specific instructions. If you have problems or questions, contact your health care provider. What can I expect after the procedure? After the procedure, it is common to have: Bruising and tenderness at the catheter insertion area. Follow these instructions at home: Medicines Take over-the-counter and prescription medicines only as told by your health care provider. Insertion site care Follow instructions from your health care provider about how to take care of your insertion site. Make sure you: Wash your hands with soap and water before you change your bandage (dressing). If soap and water are not available, use hand sanitizer. Remove your dressing as told by your health care provider. In 24 hours Check your insertion site every day for signs of infection. Check for: Redness, swelling, or pain. Fluid or blood. Pus or a bad smell. Warmth. Do not take baths, swim, or use a hot tub until your health care provider approves. You may shower 24-48 hours after the procedure, or as directed by your health care provider. Remove the dressing and gently wash the site with plain soap and water. Pat the area dry with a clean towel. Do not rub the site. That could cause bleeding. Do not apply powder or lotion to the site. Activity   For 24 hours after the procedure, or as directed by your health care provider: Do not flex or bend the affected arm. Do not push or pull heavy objects with the affected arm. Do not drive yourself home from the hospital or clinic. You may drive 24 hours after the procedure unless your health care provider tells you not to. Do not operate machinery or power tools. Do not lift anything that is heavier than 10 lb (4.5 kg), or the  limit that you are told, until your health care provider says that it is safe.  For 4 days Ask your health care provider when it is okay to: Return to work or school. Resume usual physical activities or sports. Resume sexual activity. General instructions If the catheter site starts to bleed, raise your arm and put firm pressure on the site. If the bleeding does not stop, get help right away. This is a medical emergency. If you went home on the same day as your procedure, a responsible adult should be with you for the first 24 hours after you arrive home. Keep all follow-up visits as told by your health care provider. This is important. Contact a health care provider if: You have a fever. You have redness, swelling, or yellow drainage around your insertion site. Get help right away if: You have unusual pain at the radial site. The catheter insertion area swells very fast. The insertion area is bleeding, and the bleeding does not stop when you hold steady pressure on the area. Your arm or hand becomes pale, cool, tingly, or numb. These symptoms may represent a serious problem that is an emergency. Do not wait to see if the symptoms will go away. Get medical help right away. Call your local emergency services (911 in the [place] Do not drive yourself to the hospital. Summary After the procedure, it is common to have bruising and tenderness at the site. Follow instructions from your health care provider about how to take care of your  radial site wound. Check the wound every day for signs of infection. Do not lift anything that is heavier than 10 lb (4.5 kg), or the limit that you are told, until your health care provider says that it is safe. This information is not intended to replace advice given to you by your health care provider. Make sure you discuss any questions you have with your health care provider. Document Revised: 06/09/2017 Document Reviewed: 06/09/2017 Elsevier Patient Education   2020 [employer] Femoral Site Care This sheet gives you information about how to care for yourself after your procedure. Your health care provider may also give you more specific instructions. If you have problems or questions, contact your health care provider. What can I expect after the procedure?  After the procedure, it is common to have: Bruising that usually fades within 1-2 weeks. Tenderness at the site. Follow these instructions at home: Wound care Follow instructions from your health care provider about how to take care of your insertion site. Make sure you: Wash your hands with soap and water before you change your bandage (dressing). If soap and water are not available, use hand sanitizer. Remove your dressing as told by your health care provider. In 24 hours Do not take baths, swim, or use a hot tub until your health care provider approves. You may shower 24-48 hours after the procedure or as told by your health care provider. Gently wash the site with plain soap and water. Pat the area dry with a clean towel. Do not rub the site. This may cause bleeding. Do not apply powder or lotion to the site. Keep the site clean and dry. Check your femoral site every day for signs of infection. Check for: Redness, swelling, or pain. Fluid or blood. Warmth. Pus or a bad smell. Activity For the first 2-3 days after your procedure, or as long as directed: Avoid climbing stairs as much as possible. Do not squat. Do not lift anything that is heavier than 10 lb (4.5 kg), or the limit that you are told, until your health care provider says that it is safe. For 5 days Rest as directed. Avoid sitting for a long time without moving. Get up to take short walks every 1-2 hours. Do not drive for 24 hours if you were given a medicine to help you relax (sedative). General instructions Take over-the-counter and prescription medicines only as told by your health care provider. Keep all follow-up  visits as told by your health care provider. This is important. Contact a health care provider if you have: A fever or chills. You have redness, swelling, or pain around your insertion site. Get help right away if: The catheter insertion area swells very fast. You pass out. You suddenly start to sweat or your skin gets clammy. The catheter insertion area is bleeding, and the bleeding does not stop when you hold steady pressure on the area. The area near or just beyond the catheter insertion site becomes pale, cool, tingly, or numb. These symptoms may represent a serious problem that is an emergency. Do not wait to see if the symptoms will go away. Get medical help right away. Call your local emergency services (911 in the [place] Do not drive yourself to the hospital. Summary After the procedure, it is common to have bruising that usually fades within 1-2 weeks. Check your femoral site every day for signs of infection. Do not lift anything that is heavier than 10 lb (4.5 kg), or the limit  that you are told, until your health care provider says that it is safe. This information is not intended to replace advice given to you by your health care provider. Make sure you discuss any questions you have with your health care provider. Document Revised: 05/17/2017 Document Reviewed: 05/17/2017 Elsevier Patient Education  2020 [employer].

## 2023-08-18 NOTE — Progress Notes (Signed)
 ACT 187. Permission received from Dr. Clifton James to pull sheath now.

## 2023-08-18 NOTE — Interval H&P Note (Signed)
 History and Physical Interval Note:  08/18/2023 7:16 AM  [name]  has presented today for surgery, with the diagnosis of abnormal ct - doe.  The various methods of treatment have been discussed with the patient and family. After consideration of risks, benefits and other options for treatment, the patient has consented to  Procedure(s): RIGHT/LEFT HEART CATH AND CORONARY ANGIOGRAPHY (N/A) as a surgical intervention.  The patient's history has been reviewed, patient examined, no change in status, stable for surgery.  I have reviewed the patient's chart and labs.  Questions were answered to the patient's satisfaction.    Cath Lab Visit (complete for each Cath Lab visit)  Clinical Evaluation Leading to the Procedure:   ACS: No.  Non-ACS:    Anginal Classification: CCS II  Anti-ischemic medical therapy: Maximal Therapy (2 or more classes of medications)  Non-Invasive Test Results: High-risk stress test findings: cardiac mortality >3%/year (Coronary CTA with possible severe lesion in RCA and circumflex)  Prior CABG: No previous CABG    Verne Carrow

## 2023-08-18 NOTE — Discharge Summary (Addendum)
 Discharge Summary for Same Day PCI   Patient ID: SAAHIL HERBSTER MRN: [id number]; DOB: 1940/07/23  Admit date: 08/18/2023 Discharge date: 08/18/2023  Primary Care Provider: Babs Sciara, MD  Primary Cardiologist: Marjo Bicker, MD  Primary Electrophysiologist:  None   Discharge Diagnoses    Principal Problem:   CAD (coronary artery disease) Active Problems:   HYPERTENSION, BENIGN   Persistent atrial fibrillation (HCC)   Hyperlipidemia   Secondary hypercoagulable state [facility])   Diagnostic Studies/Procedures    Cardiac Catheterization 08/18/2023:    Mid RCA to Dist RCA lesion is 40% stenosed.   Prox RCA lesion is 30% stenosed.   Lat 2nd Mrg lesion is 99% stenosed.   Prox LAD to Dist LAD lesion is 40% stenosed.   2nd Mrg lesion is 50% stenosed.   A drug-eluting stent was successfully placed using a SYNERGY XD 2.75X20.   Post intervention, there is a 0% residual stenosis.   Large caliber LAD with diffuse mild proximal and mid vessel stenosis Large caliber circumflex artery with large first obtuse marginal branch. Successful PTCA/DES x 1 obtuse marginal branch Large dominant RCA with mild to moderate non-obstructive disease in the proximal, mid and distal vessel.  Normal right and left heart pressures.    Recommendations: Same day post PCI discharge. ASA/Plavix along with Eliquis for one month then d/c ASA. Resume Eliquis tomorrow.  _____________   History of Present Illness     NEDIM OKI is a 83 y.o. male with known to have paroxysmal A-fib s/p ablation in 2011 with recurrence of A-fib, history of DVT/PE on Eliquis, HTN, HLD, mild aortic valve stenosis, chronic back pain (had multiple back surgeries) presented to cardiology clinic for follow-up visit to discuss CCTA results.   He reported having stable DOE for 2 years but noticed worsening of his symptoms in the last 1 year to the point he is not able to walk even 50 feet without having symptoms of shortness of  breath.  He also noticed worsening orthopnea recently. No angina per se except for 1 episode of chest discomfort lasting for about few minutes, this was around 3 weeks ago.  No dizziness, lightheadedness, syncope, palpitations or leg swelling. EKG today showed atrial fibrillation, rate controlled.  He underwent a CCTA cardiac that showed coronary calcium score of 3221, 89th percentile for age and sex matched control.  Total plaque volume is severe. CT cardiac showed multivessel CAD, flow-limiting lesions in the distal RCA and mid circumflex. LM assessment was limited on FFR. Distal part of left atrial appendage was not well-visualized, unable to rule out a left atrial appendage thrombus completely.  .   Cardiac catheterization was arranged for further evaluation.  Hospital Course     The patient underwent cardiac cath as noted above with nonobstructive disease in the LAD and RCA, but 99% first obtuse marginal branch stenosis successfully treated with 2.75 x 20 mm DES. Plan for DAPT with ASA/plavix along with eliquis for 30 days then discontinue ASA and continue plavix and eliquis.. The patient was seen by cardiac rehab while in short stay. There were no observed complications post cath. Radial cath site was re-evaluated prior to discharge and found to be stable without any complications. Instructions/precautions regarding cath site care were given prior to discharge.  JACOBB ALEN was seen by Dr. Aundra Dubin and determined stable for discharge home. Follow up with our office has been arranged. Medications are listed below. Pertinent changes include ASA x 30 days. I also  added PNR NTG. He has had prior intolerances to statins, remains on 80 mg pravastatin.   03/26/2023: Cholesterol, Total 141; HDL 40; LDL Chol Calc (NIH) 80; Triglycerides 113 Pt has been intolerant to statins in the past. Will refer to lipid clinic.    _____________  Cath/PCI Registry Performance & Quality Measures: Aspirin  prescribed? - Yes ADP Receptor Inhibitor (Plavix/Clopidogrel, Brilinta/Ticagrelor or Effient/Prasugrel) prescribed (includes medically managed patients)? - Yes High Intensity Statin (Lipitor 40-80mg  or Crestor 20-40mg ) prescribed? - No - statin intolerance For EF <40%, was ACEI/ARB prescribed? - Not Applicable (EF >/= 40%) For EF <40%, Aldosterone Antagonist (Spironolactone or Eplerenone) prescribed? - Not Applicable (EF >/= 40%) Cardiac Rehab Phase II ordered (Included Medically managed Patients)? - Yes  _____________   Discharge Vitals Blood pressure (!) 151/97, pulse 69, temperature 98.2 F (36.8 C), temperature source Oral, resp. rate 17, height 5\' 6"  (1.676 m), weight 85.7 kg, SpO2 (!) 80%.  Filed Weights   08/18/23 0657  Weight: 85.7 kg    Last Labs & Radiologic Studies    CBC Recent Labs    08/18/23 0825 08/18/23 0826  HGB 12.2* 12.9*  HCT 36.0* 38.0*   Basic Metabolic Panel Recent Labs    16/10/96 1358 08/18/23 0811 08/18/23 0825 08/18/23 0826  NA 138   < > 140 138  K 4.4   < > 3.4* 3.7  CL 100  --   --   --   CO2 27  --   --   --   GLUCOSE 121*  --   --   --   BUN 21  --   --   --   CREATININE 1.23  --   --   --   CALCIUM 9.4  --   --   --    < > = values in this interval not displayed.   Liver Function Tests No results for input(s): "AST", "ALT", "ALKPHOS", "BILITOT", "PROT", "ALBUMIN" in the last 72 hours. No results for input(s): "LIPASE", "AMYLASE" in the last 72 hours. High Sensitivity Troponin:   No results for input(s): "TROPONINIHS" in the last 720 hours.  BNP Invalid input(s): "POCBNP" D-Dimer No results for input(s): "DDIMER" in the last 72 hours. Hemoglobin A1C No results for input(s): "HGBA1C" in the last 72 hours. Fasting Lipid Panel No results for input(s): "CHOL", "HDL", "LDLCALC", "TRIG", "CHOLHDL", "LDLDIRECT" in the last 72 hours. Thyroid Function Tests No results for input(s): "TSH", "T4TOTAL", "T3FREE", "THYROIDAB" in the last 72  hours.  Invalid input(s): "FREET3" _____________  CARDIAC CATHETERIZATION Result Date: 08/18/2023   Mid RCA to Dist RCA lesion is 40% stenosed.   Prox RCA lesion is 30% stenosed.   Lat 2nd Mrg lesion is 99% stenosed.   Prox LAD to Dist LAD lesion is 40% stenosed.   2nd Mrg lesion is 50% stenosed.   A drug-eluting stent was successfully placed using a SYNERGY XD 2.75X20.   Post intervention, there is a 0% residual stenosis. Large caliber LAD with diffuse mild proximal and mid vessel stenosis Large caliber circumflex artery with large first obtuse marginal branch. Successful PTCA/DES x 1 obtuse marginal branch Large dominant RCA with mild to moderate non-obstructive disease in the proximal, mid and distal vessel. Normal right and left heart pressures. Recommendations: Same day post PCI discharge. ASA/Plavix along with Eliquis for one month then d/c ASA. Resume Eliquis tomorrow.   CT CORONARY FRACTIONAL FLOW RESERVE FLUID ANALYSIS Result Date: 08/10/2023 EXAM: FFRCT ANALYSIS FINDINGS: FFRct analysis was performed on the  original cardiac CT angiogram dataset. Diagrammatic representation of the FFRct analysis is provided in a separate PDF document in PACS. This dictation was created using the PDF document and an interactive 3D model of the results. 3D model is not available in the EMR/PACS. Normal FFR range is >0.80. 1. LM FFR: 0.99 2. LAD FFR: Prox 0.98, mid 0.82, distal 0.71 3. CX FFR: Prox 0.97, mid 0.66, distal <0.5 4. RCA FFR: Prox 0.98, mid 0.95, distal 0.5 IMPRESSION: This study demonstrates HIGH likelihood of hemodynamically significant stenosis on mid CX and distal RCA. Please note that LM assessment is limited on FFR. Electronically Signed   By: Gypsy Balsam M.D.   On: 08/10/2023 12:48   CT CORONARY MORPH W/CTA COR W/SCORE W/CA W/CM &/OR WO/CM Result Date: 08/10/2023 CLINICAL DATA:  CP EXAM: Cardiac/Coronary  CTA TECHNIQUE: The patient was scanned on a GE Apex scanner. FINDINGS: A 120 kV  prospective scan was triggered in the descending thoracic aorta at 111 HU's. Axial non-contrast 3 mm slices were carried out through the heart. The data set was analyzed on a dedicated work station and scored using the Agatson method. Gantry rotation speed was 250 msecs and collimation was .6 mm. No beta blockade and 0.8 mg of sl NTG was given. The 3D data set was reconstructed at 75 % of the R-R cycle. Diastolic phases were analyzed on a dedicated work station using MPR, MIP and VRT modes. The patient received 80 cc of contrast. Aorta:  Normal size.  Mild calcifications.  No dissection. Aortic Valve:  Trileaflet.  Mild calcifications. Coronary Arteries:  Normal coronary origin.  Right dominance. RCA is a large dominant artery that gives rise to PDA and PLA. This artery is diffusely diseased with multiple mostly calcified plaques in its proximal and mid portion with mild stenosis of 25-49%. In the distal portion of RCA and at the bifurcation of PDA and PLB there is a mixed plaque with severe stenosis of 70-99%. Left main is a large artery that gives rise to LAD and LCX arteries. There is a mixed plaque at the orifice and proximal portion of this artery with mild stenosis of 25-49% LAD is a large vessel that has multiple mixed plaques in its proximal and mid portion with moderate stenosis of 50-69%. This artery gives rise to moderate size D1. D1 has numerous calcified plaques with moderate stenosis of 50-69% LCX is a non-dominant artery that gives rise to one large OM1 branch. This artery is diffusely diseased with multiple mixed plaques and potential severe stenosis of 70-99% in its mid portion. Other findings: Normal pulmonary vein drainage into the left atrium. Left common pulmonary vein trunk noted - normal variant. Normal left atrial appendage but distal portion of the appendage was not completely opacified. Can not r/o thrombus. Normal size of the pulmonary artery. IMPRESSION: 1. Coronary calcium score of 3221.  This was 37 percentile for age and sex matched control. 2. Normal coronary origin with right dominance. 3. CAD-RADS 4 Severe stenosis. (70-99% or > 50% left main). LM, mid CX, distal RCA cardiac catheterization or CT FFR is recommended. Consider symptom-guided anti-ischemic pharmacotherapy as well as risk factor modification per guideline directed care. 4. Plaque analysis: TPV 2487 mm3, 97th percentile, non calcified 1780 mm3, calcified plaque 707 mm3. TPV is severe. Electronically Signed   By: Gypsy Balsam M.D.   On: 08/10/2023 12:44    Disposition   Pt is being discharged home today in good condition.  Follow-up Plans & Appointments  Discharge Medications   Allergies as of 08/18/2023       Reactions   Sectral [acebutolol Hcl] Other (See Comments)   Unknown reaction   Sulfonamide Derivatives Other (See Comments)   Runs blood pressure up   Zocor [simvastatin] Other (See Comments)   Joint pain        Medication List     PAUSE taking these medications    Eliquis 5 MG Tabs tablet Wait to take this until: August 19, 2023 Morning Generic drug: apixaban TAKE 1 TABLET BY MOUTH TWICE A DAY. STOP PRADAXA       TAKE these medications    amLODipine 10 MG tablet Commonly known as: NORVASC TAKE 1 TABLET BY MOUTH EVERY DAY   aspirin EC 81 MG tablet Take 1 tablet (81 mg total) by mouth daily. Swallow whole.   clopidogrel 75 MG tablet Commonly known as: Plavix Take 1 tablet (75 mg total) by mouth daily.   diphenhydramine-acetaminophen 25-500 MG Tabs tablet Commonly known as: TYLENOL PM Take 2 tablets by mouth at bedtime.   indapamide 1.25 MG tablet Commonly known as: LOZOL TAKE 1 TABLET BY MOUTH DAILY.   latanoprost 0.005 % ophthalmic solution Commonly known as: XALATAN Place 1 drop into both eyes at bedtime.   Metamucil Smooth Texture 58.6 % powder Generic drug: psyllium Take 1 packet by mouth at bedtime.   metoprolol succinate 25 MG 24 hr  tablet Commonly known as: TOPROL-XL TAKE 1 TABLET BY MOUTH TWICE A DAY   multivitamin with minerals Tabs tablet Take 1 tablet by mouth 3 (three) times a week.   nitroGLYCERIN 0.4 MG SL tablet Commonly known as: Nitrostat Place 1 tablet (0.4 mg total) under the tongue every 5 (five) minutes as needed for chest pain.   Oxycodone HCl 10 MG Tabs 1 taken 4 times daily as needed pain   Oxycodone HCl 10 MG Tabs 1 taken 4 times daily as needed for pain   Oxycodone HCl 10 MG Tabs 1 tablet taken 3 or 4 times daily as needed for pain caution drowsiness   pantoprazole 40 MG tablet Commonly known as: PROTONIX Take 1 tablet (40 mg total) by mouth 2 (two) times daily. What changed:  when to take this additional instructions   pravastatin 80 MG tablet Commonly known as: PRAVACHOL TAKE 1 TABLET BY MOUTH EVERYDAY AT BEDTIME   ramipril 10 MG capsule Commonly known as: ALTACE TAKE 1 CAPSULE BY MOUTH 2 TIMES DAILY.   sucralfate 1 g tablet Commonly known as: CARAFATE CRUSH MIX IN WATER USE THREE TIMES DAY FOR 30 DAYS   tamsulosin 0.4 MG Caps capsule Commonly known as: FLOMAX Take 1 capsule (0.4 mg total) by mouth 2 (two) times daily.   torsemide 10 MG tablet Commonly known as: DEMADEX Take 1 tablet (10 mg total) by mouth daily.   Xdemvy 0.25 % Soln Generic drug: Lotilaner Place 1 drop into both eyes daily as needed (irritation/inflammation).           Allergies Allergies  Allergen Reactions   Sectral [Acebutolol Hcl] Other (See Comments)    Unknown reaction   Sulfonamide Derivatives Other (See Comments)    Runs blood pressure up   Zocor [Simvastatin] Other (See Comments)    Joint pain    Outstanding Labs/Studies     Duration of Discharge Encounter : 20 min time spent reviewing chart, medication reconciliation and patient counseling.   Signed, Roe Rutherford Bianey Tesoro, PA 08/18/2023, 1:09 PM

## 2023-08-19 ENCOUNTER — Encounter (HOSPITAL_COMMUNITY): Payer: Self-pay | Admitting: Cardiovascular Disease

## 2023-09-08 ENCOUNTER — Ambulatory Visit: Payer: PPO | Admitting: Family Medicine

## 2023-09-08 DIAGNOSIS — K219 Gastro-esophageal reflux disease without esophagitis: Secondary | ICD-10-CM | POA: Diagnosis not present

## 2023-09-08 MED ORDER — PANTOPRAZOLE SODIUM 40 MG PO TBEC: 40.0000 mg | DELAYED_RELEASE_TABLET | Freq: Two times a day (BID) | ORAL | 1 refills | Status: DC

## 2023-09-08 MED ORDER — PRAVASTATIN SODIUM 80 MG PO TABS: 80.0000 mg | ORAL_TABLET | Freq: Every day | ORAL | 1 refills | Status: DC

## 2023-09-08 MED ORDER — AMLODIPINE BESYLATE 10 MG PO TABS: 10.0000 mg | ORAL_TABLET | Freq: Every day | ORAL | 1 refills | Status: DC

## 2023-09-08 MED ORDER — OXYCODONE HCL 10 MG PO TABS: ORAL_TABLET | ORAL | 0 refills | Status: DC

## 2023-09-08 MED ORDER — OXYCODONE HCL 10 MG PO TABS
ORAL_TABLET | ORAL | 0 refills | Status: DC
Start: 2023-09-08 — End: 2023-12-15

## 2023-09-08 MED ORDER — INDAPAMIDE 1.25 MG PO TABS: 1.2500 mg | ORAL_TABLET | Freq: Every day | ORAL | 1 refills | Status: DC

## 2023-09-08 NOTE — Progress Notes (Signed)
 Subjective:    Patient ID: [name], male    DOB: 12/24/1940, 83 y.o.   MRN: [id number]  HPI Pt comes in today for follow up visit and would like to discuss new medications he is on. Medication and allergies reviewed.  This patient was seen today for chronic pain  The medication list was reviewed and updated.   Location of Pain for which the patient has been treated with regarding narcotics: Lumbar pain back pain radiates into the legs  Onset of this pain: Present for years   -Compliance with medication: Good control with medicine  - Number patient states they take daily: 4 tablets daily  -Reason for ongoing use of opioids cannot take NSAIDs, does not get adequate relief with Tylenol , has had numerous surgeries without success  What other measures have been tried outside of opioids surgeries, physical therapy, injections  In the ongoing specialists regarding this condition back specialist on a periodic basis  -when was the last dose patient took?  Within the past 24 hours  The patient was advised the importance of maintaining medication and not using illegal substances with these.  Here for refills and follow up  The patient was educated that we can provide 3 monthly scripts for their medication, it is their responsibility to follow the instructions.  Side effects or complications from medications: Denies side effects  Patient is aware that pain medications are meant to minimize the severity of the pain to allow their pain levels to improve to allow for better function. They are aware of that pain medications cannot totally remove their pain.  Due for UDT ( at least once per year) (pain management contract is also completed at the time of the UDT): October 2024  Scale of 1 to 10 ( 1 is least 10 is most) Your pain level without the medicine: 9-10 Your pain level with medication 4-5  Scale 1 to 10 ( 1-helps very little, 10 helps very well) How well does your pain  medication reduce your pain so you can function better through out the day?  8  Quality of the pain: Throbbing aching  Persistence of the pain: Has all time  Modifying factors: Worse with 70      Review of Systems     Objective:   Physical Exam General-in no acute distress Eyes-no discharge Lungs-respiratory rate normal, CTA CV-no murmurs,RRR Extremities skin warm dry no edema Neuro grossly normal Behavior normal, alert Patient with mild senile purpura probably related to all the medicines he is on he denies any hematuria or hematochezia       Assessment & Plan:  Patient with senile purpura For now I think he has to take all of his medicines but not uncertain how long he have to be on the Plavix   The patient was seen in followup for chronic pain. A review over at their current pain status was discussed. Drug registry was checked. Prescriptions were given.  Regular follow-up recommended. Discussion was held regarding the importance of compliance with medication as well as pain medication contract.  Patient was informed that medication may cause drowsiness and should not be combined  with other medications/alcohol  or street drugs. If the patient feels medication is causing altered alertness then do not drive or operate dangerous equipment.  Should be noted that the patient appears to be meeting appropriate use of opioids and response.  Evidenced by improved function and decent pain control without significant side effects and no evidence of overt  aberrancy issues.  Upon discussion with the patient today they understand that opioid therapy is optional and they feel that the pain has been refractory to reasonable conservative measures and is significant and affecting quality of life enough to warrant ongoing therapy and wishes to continue opioids.  Refills were provided.  [place]  medical Board guidelines regarding the pain medicine has been reviewed.  CDC guidelines most  updated 2022 has been reviewed by the prescriber.  PDMP is checked on a regular basis yearly urine drug screen and pain management contract  Treatment plan for this patient includes #1-gentle stretching exercises as shown daily basis 2.  Mild strength exercises 3 times per week #3 continue pain medications #4 notify us  if any digression  For now we will maintain 4 tablets/day he has been very faithful with this he is in the ask for medications early patient is under a lot of stress he states he does not feel he can go down to 3 tablets/day and he wonders if he may supplement his pain medicine with alcohol  I told him that would be a bad idea so for now we will maintain 4 tablets/day

## 2023-09-15 ENCOUNTER — Ambulatory Visit: Attending: Student | Admitting: Student

## 2023-09-15 ENCOUNTER — Encounter: Payer: Self-pay | Admitting: Student

## 2023-09-15 VITALS — BP 126/60 | HR 70 | Ht 64.0 in | Wt 191.4 lb

## 2023-09-15 DIAGNOSIS — Z86711 Personal history of pulmonary embolism: Secondary | ICD-10-CM | POA: Diagnosis not present

## 2023-09-15 DIAGNOSIS — E785 Hyperlipidemia, unspecified: Secondary | ICD-10-CM

## 2023-09-15 DIAGNOSIS — I10 Essential (primary) hypertension: Secondary | ICD-10-CM

## 2023-09-15 DIAGNOSIS — I4819 Other persistent atrial fibrillation: Secondary | ICD-10-CM

## 2023-09-15 DIAGNOSIS — Z79899 Other long term (current) drug therapy: Secondary | ICD-10-CM | POA: Diagnosis not present

## 2023-09-15 DIAGNOSIS — I350 Nonrheumatic aortic (valve) stenosis: Secondary | ICD-10-CM

## 2023-09-15 DIAGNOSIS — I2510 Atherosclerotic heart disease of native coronary artery without angina pectoris: Secondary | ICD-10-CM | POA: Diagnosis not present

## 2023-09-15 MED ORDER — CLOPIDOGREL BISULFATE 75 MG PO TABS: 75.0000 mg | ORAL_TABLET | Freq: Every day | ORAL | 3 refills | Status: AC

## 2023-09-15 MED ORDER — VALSARTAN 160 MG PO TABS: 160.0000 mg | ORAL_TABLET | Freq: Every day | ORAL | 3 refills | Status: DC

## 2023-09-15 NOTE — Progress Notes (Signed)
 Cardiology Office Note    Date:  09/15/2023  ID:  [name], [name] Mar 24, 1941, MRN [id number] Cardiologist: Lasalle Pointer, MD    History of Present Illness:    [name] is a 83 y.o. male with past medical history of persistent atrial fibrillation (prior ablation in 2011 with recurrence diagnosed in 04/2023, prior PE/DVT, aortic stenosis, HTN and HLD who presents to the office today for follow-up from his recent cardiac catheterization.   He was examined by myself in 06/2023 and reported worsening dyspnea on exertion for the past 6 months to 1 year which occurred when walking up inclines or for longer distances. A Coronary CTA was recommended for further evaluation. He was also noted to be in atrial fibrillation by review of prior EKG's and it was felt that he had been in atrial fibrillation since at least 04/2023. He was referred back to EP for consideration of antiarrhythmic options. His Coronary CTA showed a calcium score of 3221 and he did have severe stenosis along the LCx and distal RCA which was positive by FFR. He did see Dr. Mallipeddi on 08/12/2023 for follow-up of this and a cardiac catheterization was arranged. This was performed by Dr. Abel Hoe on 08/18/2023 and showed 30% proximal RCA stenosis, 40% mid to distal RCA stenosis, 40% proximal LAD to distal LAD, 50% second marginal stenosis and 99% lateral second marginal stenosis. He did undergo successful PTCA/DES x 1 to the obtuse marginal branch. Was recommended to be on ASA and Plavix  along with Eliquis  for 1 month and then discontinue ASA. He was discharged the same day of his cardiac catheterization. He was referred to [facility] given his intolerance to higher-intensity statins.  In talking with the patient and his daughter today, he reports still having dyspnea on exertion and is unsure if this improved after his stent placement. Was previously having episodes of orthopnea and shortness of breath at night and says this  has now resolved. He denies any recent chest pain or palpitations. Is overall unaware that he is still in atrial fibrillation. He feels that his shortness of breath may be due to his ACE-I as he says this was a possible culprit that was brought up by his Pulmonologist (Dr. Waymond Hailey). He has been experiencing easy bruising since being on ASA, Plavix  and Eliquis  and we reviewed that he could stop ASA on 09/17/2023. Denies any recent melena, hematochezia or hematuria. No complications regarding his groin cath site.   Studies Reviewed:   EKG: EKG is ordered today and demonstrates:   EKG Interpretation Date/Time:  Wednesday September 15 2023 15:36:16 EDT Ventricular Rate:  70 PR Interval:    QRS Duration:  88 QT Interval:  406 QTC Calculation: 438 R Axis:   7  Text Interpretation: Atrial fibrillation TWI along inferior leads. Confirmed by Woodfin Hays ([id number]) on 09/15/2023 3:50:58 PM       Echocardiogram: 05/2023 IMPRESSIONS     1. Left ventricular ejection fraction, by estimation, is 60 to 65%. The  left ventricle has normal function. The left ventricle has no regional  wall motion abnormalities. There is moderate left ventricular hypertrophy.  Left ventricular diastolic function   could not be evaluated.   2. Right ventricular systolic function is low normal. The right  ventricular size is mildly enlarged. Tricuspid regurgitation signal is  inadequate for assessing PA pressure.   3. Left atrial size was severely dilated.   4. Right atrial size was severely dilated.   5. The  mitral valve is normal in structure. Mild mitral valve  regurgitation. No evidence of mitral stenosis.   6. The aortic valve has an indeterminant number of cusps. There is  moderate calcification of the aortic valve. Aortic valve regurgitation is  trivial. Mild aortic valve stenosis. Aortic valve area, by VTI measures  1.96 cm. Aortic valve mean gradient  measures 11.0 mmHg. Aortic valve Vmax measures 2.05 m/s.    7. The inferior vena cava is normal in size with greater than 50%  respiratory variability, suggesting right atrial pressure of 3 mmHg.   Comparison(s): No significant change from prior study.    R/LHC: 08/2023   Mid RCA to Dist RCA lesion is 40% stenosed.   Prox RCA lesion is 30% stenosed.   Lat 2nd Mrg lesion is 99% stenosed.   Prox LAD to Dist LAD lesion is 40% stenosed.   2nd Mrg lesion is 50% stenosed.   A drug-eluting stent was successfully placed using a SYNERGY XD 2.75X20.   Post intervention, there is a 0% residual stenosis.   Large caliber LAD with diffuse mild proximal and mid vessel stenosis Large caliber circumflex artery with large first obtuse marginal branch. Successful PTCA/DES x 1 obtuse marginal branch Large dominant RCA with mild to moderate non-obstructive disease in the proximal, mid and distal vessel.  Normal right and left heart pressures.    Recommendations: Same day post PCI discharge. ASA/Plavix  along with Eliquis  for one month then d/c ASA. Resume Eliquis  tomorrow.     Risk Assessment/Calculations:    CHA2DS2-VASc Score = 3   This indicates a 3.2% annual risk of stroke. The patient's score is based upon: CHF History: 0 HTN History: 1 Diabetes History: 0 Stroke History: 0 Vascular Disease History: 0 Age Score: 2 Gender Score: 0   Physical Exam:   VS:  BP 126/60 (BP Location: Right Arm, Cuff Size: Normal)   Pulse (!) 56   Ht 5\' 4"  (1.626 m)   Wt 191 lb 6.4 oz (86.8 kg)   SpO2 98%   BMI 32.85 kg/m    Wt Readings from Last 3 Encounters:  09/15/23 191 lb 6.4 oz (86.8 kg)  09/08/23 189 lb 6.4 oz (85.9 kg)  08/18/23 189 lb (85.7 kg)     GEN: Pleasant, elderly male appearing in no acute distress NECK: No JVD; No carotid bruits CARDIAC: Irregularly irregular, 2/6 systolic murmur along sternal border.  RESPIRATORY:  Clear to auscultation without rales, wheezing or rhonchi  ABDOMEN: Appears non-distended. No obvious abdominal  masses. EXTREMITIES: No clubbing or cyanosis. No pitting edema.  Distal pedal pulses are 2+ bilaterally.   Assessment and Plan:   1. CAD/HLD - He is s/p cardiac catheterization following abnormal Coronary CTA and underwent PTCA/DES x 1 to the obtuse marginal branch as discussed above. - He does feel like his shortness of breath improved some as discussed above but still has intermittent dyspnea. No recent chest pain. - Continue Plavix  75 mg daily and we reviewed that he could stop ASA 81 mg daily on 09/17/2023 as recommended at the time of his cardiac catheterization. Continue Toprol -XL 25 mg twice daily and Pravastatin  80 mg daily. We reviewed possibly adding Zetia in addition to continuing Pravastatin  given his LDL at 80 when checked in 56/2130.  He wishes to hold off on this currently and keep scheduled follow-up with [facility] on 10/22/2023 to review other options.  2. Persistent Atrial Fibrillation/Use of Long-term Anticoagulation - He had a prior ablation in 2011 and  was found to have recurrent atrial fibrillation by EKG in 04/2023 and by repeat EKG's since. He was referred back to EP for consideration of antiarrhythmic options and is scheduled to meet with Dr. Carolynne Citron in 11/2023 (previously declined to go to [place]).  - He remains on Toprol -XL 25 mg twice daily for rate-control and is on Eliquis  5 mg twice daily for anticoagulation which is the appropriate dose given his current age, weight and renal function (creatinine at 1.23 when checked on 08/16/2023).  3. History of PE/DVT - He has been continued on Eliquis  as discussed above. CTA in 05/2023 was negative for a PE.   4. Aortic Stenosis - This was mild by echo in 05/2023. Will continue to follow over time.   5. HTN - BP is well-controlled at 126/60 during today's visit. Continue current medical therapy with Amlodipine  10 mg daily, Lozol  1.25 mg daily and Toprol -XL 25 mg twice daily. He does feel that Ramipril  is potentially  causing shortness of breath as suggested by Pulmonology. Reviewed options with the patient and will stop this and switch to Valsartan 160 mg daily. He was provided with a BP log and encouraged to return this in a few weeks. If BP is above goal, could titrate to 320 mg daily. Recheck BMET in 2 weeks.  Signed, Dorma Gash, PA-C

## 2023-09-15 NOTE — Patient Instructions (Signed)
 Medication Instructions:  Your physician has recommended you make the following change in your medication:   Stop Taking Ramipril    Start Taking Valsartan 160 mg Daily   *If you need a refill on your cardiac medications before your next appointment, please call your pharmacy*  Lab Work: Your physician recommends that you return for lab work in: 2 Weeks   If you have labs (blood work) drawn today and your tests are completely normal, you will receive your results only by: [employer] (if you have MyChart) OR A paper copy in the mail If you have any lab test that is abnormal or we need to change your treatment, we will call you to review the results.  Testing/Procedures: NONE    Follow-Up: At [facility], you and your health needs are our priority.  As part of our continuing mission to provide you with exceptional heart care, our providers are all part of one team.  This team includes your primary Cardiologist (physician) and Advanced Practice Providers or APPs (Physician Assistants and Nurse Practitioners) who all work together to provide you with the care you need, when you need it.  Your next appointment:   3 month(s)  Provider:   Vishnu Mallipeddi, MD or Woodfin Hays, PA-C    We recommend signing up for the patient portal called "MyChart".  Sign up information is provided on this After Visit Summary.  MyChart is used to connect with patients for Virtual Visits (Telemedicine).  Patients are able to view lab/test results, encounter notes, upcoming appointments, etc.  Non-urgent messages can be sent to your provider as well.   To learn more about what you can do with MyChart, go to [email or web address].   Other Instructions Thank you for choosing [facility]!

## 2023-10-04 ENCOUNTER — Ambulatory Visit: Payer: Self-pay | Admitting: Cardiology

## 2023-10-04 ENCOUNTER — Other Ambulatory Visit (HOSPITAL_COMMUNITY)
Admission: RE | Admit: 2023-10-04 | Discharge: 2023-10-04 | Disposition: A | Discharge status: Home / Self Care | Source: Ambulatory Visit | Attending: Student | Admitting: Student

## 2023-10-04 DIAGNOSIS — Z79899 Other long term (current) drug therapy: Secondary | ICD-10-CM | POA: Insufficient documentation

## 2023-10-04 LAB — BASIC METABOLIC PANEL WITH GFR
Anion gap: 8 (ref 5–15)
BUN: 22 mg/dL (ref 8–23)
CO2: 25 mmol/L (ref 22–32)
Calcium: 9 mg/dL (ref 8.9–10.3)
Chloride: 102 mmol/L (ref 98–111)
Creatinine, Ser: 1.18 mg/dL (ref 0.61–1.24)
GFR, Estimated: >60 mL/min (ref >60)
Glucose, Bld: 126 mg/dL — ABNORMAL HIGH (ref 70–99)
Potassium: 3.8 mmol/L (ref 3.5–5.1)
Sodium: 135 mmol/L (ref 135–145)

## 2023-10-07 ENCOUNTER — Ambulatory Visit (INDEPENDENT_AMBULATORY_CARE_PROVIDER_SITE_OTHER): Payer: PPO | Admitting: Gastroenterology

## 2023-10-15 DIAGNOSIS — H01023 Squamous blepharitis right eye, unspecified eyelid: Secondary | ICD-10-CM | POA: Diagnosis not present

## 2023-10-15 DIAGNOSIS — H16223 Keratoconjunctivitis sicca, not specified as Sjogren's, bilateral: Secondary | ICD-10-CM | POA: Diagnosis not present

## 2023-10-15 DIAGNOSIS — Z961 Presence of intraocular lens: Secondary | ICD-10-CM | POA: Diagnosis not present

## 2023-10-15 DIAGNOSIS — H401132 Primary open-angle glaucoma, bilateral, moderate stage: Secondary | ICD-10-CM | POA: Diagnosis not present

## 2023-10-16 ENCOUNTER — Other Ambulatory Visit: Payer: Self-pay | Admitting: Family Medicine

## 2023-10-20 ENCOUNTER — Ambulatory Visit: Payer: PPO | Admitting: Student

## 2023-10-22 ENCOUNTER — Ambulatory Visit: Admitting: Pharmacist Clinician (PhC)/ Clinical Pharmacy Specialist

## 2023-11-09 ENCOUNTER — Encounter (INDEPENDENT_AMBULATORY_CARE_PROVIDER_SITE_OTHER): Payer: Self-pay | Admitting: Gastroenterology

## 2023-11-09 ENCOUNTER — Ambulatory Visit (INDEPENDENT_AMBULATORY_CARE_PROVIDER_SITE_OTHER): Payer: PPO | Admitting: Gastroenterology

## 2023-11-09 VITALS — BP 104/59 | HR 71 | Temp 97.6°F | Ht 64.0 in | Wt 188.0 lb

## 2023-11-09 DIAGNOSIS — R682 Dry mouth, unspecified: Secondary | ICD-10-CM

## 2023-11-09 DIAGNOSIS — K219 Gastro-esophageal reflux disease without esophagitis: Secondary | ICD-10-CM | POA: Diagnosis not present

## 2023-11-09 NOTE — Progress Notes (Addendum)
 Referring Provider: Alphonsa Glendia LABOR, MD Primary Care Physician:  Alphonsa Glendia LABOR, MD Primary GI Physician: Dr. Eartha   Chief Complaint  Patient presents with   Follow-up    Pt arrives for follow up. Pt states things are going good.    HPI:   [name] is a 83 y.o. male with past medical history of arthritis, skin cancer, carpal tunnel syndrome, chronic back pain, DVT, GERD, glaucoma, HTN, HLD   Patient presenting today for:  Follow up of dry mouth  GERD  Last seen February, at that time mouth remains dry and he has to sip on water  and suck on hard candy.  often has to sit up and sleep as dry mouth is worse when he lays down and sitting up to sleep improves his symptoms. GERD is well controlled on protonix  40mg  BID. saw cardiology on 2/21 and was found to have a fib with controlled rate. Asymptomatic. Upcoming stress test.   Recommended Magic mouthwash 3 times daily x 10 days, Continue PPI twice dailyContinue to sip on water  and suck on sugar-free hard candy to help with symptoms of dry mouth  Last labs with hgb 14.2 on 08/12/23  Present:  He is unsure if he did magic mouthwash after last visit but dry mouth is much better. Very occasional issues with this now. GERD is well controlled on protonix  40mg  BID. Appetite is good. No constipation or diarrhea, he is taking metamucil at bedtime which seems to keep things moving. No rectal bleeding or melena. States his wife is having some health issues and he is concerned about her.    Last Colonoscopy:12/2022 - The examined portion of the ileum was normal.                           - Five 4 to 8 mm polyps in the transverse colon and                            in the ascending colon, removed with a cold snare.                            Resected and retrieved.                           - Diverticulosis in the sigmoid colon and in the                            descending colon.                           - Non-bleeding internal  hemorrhoids. (If recurrent small bowel obstruction may consider outpatient evaluation by general surgery)   Last Endoscopy: 05/14/2023,no gross lesions in the entire esophagus, granular mucosa in the stomach which was biopsied, multiple gastric polyps, normal duodenal bulb, second portion of the duodenum and third portion of the duodenum, biopsies taken with cold forceps for evaluation of eosinophilic esophagitis   Biopsies from EGD with benign fundic gland polyps in the stomach, negative H. pylori, esophageal biopsies with reflux changes.   Filed Weights   11/09/23 1136  Weight: 188 lb (85.3 kg)     Past Medical History:  Diagnosis Date   Arthritis    Cancer (  HCC)    skin   Carpal tunnel syndrome    Cataract    left eye   Chronic back pain    multiple back surgeries   Complication of anesthesia    pt states woke up during hemorroid surgery and stopped breathing during his back surgery   DVT (deep venous thrombosis) (HCC) 03/2007   left leg    Dysrhythmia    GERD (gastroesophageal reflux disease)    takes Protonix  daily   Glaucoma    History of colon polyps    History of hyperglycemia    History of MRSA infection 1992   HTN (hypertension)    takes  Ramipril  daily   Hyperlipidemia    takes Pravastatin  3 times a week   Nocturia    Paroxysmal atrial fibrillation (HCC)    takes Pradaxa  daily, Chads2vasc score 2(07/11/14)   Pneumonia    many yrs ago   Pulmonary embolism (HCC) 2008    Past Surgical History:  Procedure Laterality Date   APPLICATION OF ROBOTIC ASSISTANCE FOR SPINAL PROCEDURE  05/06/2021   Procedure: APPLICATION OF ROBOTIC ASSISTANCE FOR SPINAL PROCEDURE;  Surgeon: Colon Shove, MD;  Location: [facility];  Service: Neurosurgery;;   BACK SURGERY     BIOPSY  04/01/2018   Procedure: BIOPSY;  Surgeon: Golda Claudis PENNER, MD;  [facility]: [facility];  Service: Endoscopy;;  gastric   BIOPSY  05/14/2023   Procedure: BIOPSY;  Surgeon: Cinderella Deatrice FALCON, MD;  [facility];  Service: Endoscopy;;   CARDIAC CATHETERIZATION  05/18/2006   CARPAL TUNNEL RELEASE Right    cataract surgery Right    cervical and lumbar surgery     COLONOSCOPY  04/24/2011   Procedure: COLONOSCOPY;  Surgeon: Claudis PENNER Golda, MD;  [facility];  Service: Endoscopy;  Laterality: N/A;  1200   COLONOSCOPY N/A 08/13/2016   Procedure: COLONOSCOPY;  Surgeon: Claudis PENNER Golda, MD;  [facility];  Service: Endoscopy;  Laterality: N/A;  1200   COLONOSCOPY WITH PROPOFOL  N/A 12/22/2022   Procedure: COLONOSCOPY WITH PROPOFOL ;  Surgeon: Eartha Angelia Sieving, MD;  [facility];  Service: Gastroenterology;  Laterality: N/A;  11:15am;asa 3   CORONARY STENT INTERVENTION N/A 08/18/2023   Procedure: CORONARY STENT INTERVENTION;  Surgeon: Verlin Lonni BIRCH, MD;  Location: [facility];  Service: Cardiovascular;  Laterality: N/A;   ESOPHAGOGASTRODUODENOSCOPY  03/10/2012   Procedure: ESOPHAGOGASTRODUODENOSCOPY (EGD);  Surgeon: Claudis PENNER Golda, MD;  [facility];  Service: Endoscopy;  Laterality: N/A;  325   ESOPHAGOGASTRODUODENOSCOPY (EGD) WITH PROPOFOL  N/A 04/01/2018   Procedure: ESOPHAGOGASTRODUODENOSCOPY (EGD) WITH PROPOFOL ;  Surgeon: Golda Claudis PENNER, MD;  [facility];  Service: Endoscopy;  Laterality: N/A;   ESOPHAGOGASTRODUODENOSCOPY (EGD) WITH PROPOFOL  N/A 05/14/2023   Procedure: ESOPHAGOGASTRODUODENOSCOPY (EGD) WITH PROPOFOL ;  Surgeon: Cinderella Deatrice FALCON, MD;  [facility];  Service: Endoscopy;  Laterality: N/A;  8:00am;asa 3   EYE SURGERY     HARDWARE REMOVAL N/A 04/24/2014   Procedure: REMOVAL OF SACRAL INSTRUMENTATION WITH METREX;  Surgeon: Shove Colon, MD;  Location: [facility];  Service: Neurosurgery;  Laterality: N/A;   HEMORRHOIDECTOMY WITH HEMORRHOID BANDING     JOINT REPLACEMENT     KNEE ARTHROSCOPY Right    KNEE ARTHROSCOPY WITH MEDIAL MENISECTOMY Right 08/03/2018   Procedure: RIGHT KNEE  ARTHROSCOPY WITH PARTIAL MEDIAL MENISCECTOMY;  Surgeon: Jerri Kay HERO, MD;  Location: [facility];  Service: Orthopedics;  Laterality: Right;  KNEE SURGERY     left   LASIK     POLYPECTOMY  08/13/2016   Procedure: POLYPECTOMY;  Surgeon: Claudis RAYMOND Rivet, MD;  [facility];  Service: Endoscopy;;  colon   POLYPECTOMY  12/22/2022   Procedure: POLYPECTOMY;  Surgeon: Eartha Angelia Sieving, MD;  [facility];  Service: Gastroenterology;;   POLYPECTOMY  05/14/2023   Procedure: POLYPECTOMY;  Surgeon: Cinderella Deatrice FALCON, MD;  [facility];  Service: Endoscopy;;   PVI  01/24/2010   afib ablation   RIGHT/LEFT HEART CATH AND CORONARY ANGIOGRAPHY N/A 08/18/2023   Procedure: RIGHT/LEFT HEART CATH AND CORONARY ANGIOGRAPHY;  Surgeon: Verlin Lonni BIRCH, MD;  [facility];  Service: Cardiovascular;  Laterality: N/A;   TRANSFORAMINAL LUMBAR INTERBODY FUSION (TLIF) WITH PEDICLE SCREW FIXATION 1 LEVEL N/A 05/06/2021   Procedure: Transforaminal Lumbar Interbody Fusion  - Thoracic twelve-Lumbar one fixation Thoracic nine to Lumbar one with methacrylate augmentation with mazor;  Surgeon: Colon Shove, MD;  Location: [facility];  Service: Neurosurgery;  Laterality: N/A;    Current Outpatient Medications  Medication Sig Dispense Refill   amLODipine  (NORVASC ) 10 MG tablet Take 1 tablet (10 mg total) by mouth daily. 90 tablet 1   clopidogrel  (PLAVIX ) 75 MG tablet Take 1 tablet (75 mg total) by mouth daily. 90 tablet 3   diphenhydramine -acetaminophen  (TYLENOL  PM) 25-500 MG TABS tablet Take 2 tablets by mouth at bedtime.     ELIQUIS  5 MG TABS tablet TAKE 1 TABLET BY MOUTH TWICE A DAY. STOP PRADAXA  60 tablet 5   indapamide  (LOZOL ) 1.25 MG tablet Take 1 tablet (1.25 mg total) by mouth daily. 90 tablet 1   latanoprost  (XALATAN ) 0.005 % ophthalmic solution Place 1 drop into both eyes at bedtime.     metoprolol  succinate (TOPROL -XL) 25 MG 24 hr tablet TAKE 1  TABLET BY MOUTH TWICE A DAY 180 tablet 2   Multiple Vitamin (MULTIVITAMIN WITH MINERALS) TABS tablet Take 1 tablet by mouth 3 (three) times a week.     nitroGLYCERIN  (NITROSTAT ) 0.4 MG SL tablet Place 1 tablet (0.4 mg total) under the tongue every 5 (five) minutes as needed for chest pain. 25 tablet 3   Oxycodone  HCl 10 MG TABS 1 taken 4 times daily as needed for pain 120 tablet 0   Oxycodone  HCl 10 MG TABS 1 tablet taken 3 or 4 times daily as needed for pain caution drowsiness 120 tablet 0   Oxycodone  HCl 10 MG TABS 1 taken 4 times daily as needed pain 120 tablet 0   pantoprazole  (PROTONIX ) 40 MG tablet Take 1 tablet (40 mg total) by mouth 2 (two) times daily. 180 tablet 1   pravastatin  (PRAVACHOL ) 80 MG tablet Take 1 tablet (80 mg total) by mouth daily. 90 tablet 1   psyllium (METAMUCIL SMOOTH TEXTURE) 58.6 % powder Take 1 packet by mouth at bedtime.     sucralfate  (CARAFATE ) 1 g tablet CRUSH MIX IN WATER  USE THREE TIMES DAY FOR 30 DAYS 270 tablet 1   tamsulosin  (FLOMAX ) 0.4 MG CAPS capsule Take 1 capsule (0.4 mg total) by mouth 2 (two) times daily. 180 capsule 3   torsemide  (DEMADEX ) 10 MG tablet Take 1 tablet (10 mg total) by mouth daily. 30 tablet 5   valsartan  (DIOVAN ) 160 MG tablet Take 1 tablet (160 mg total) by mouth daily. 90 tablet 3   XDEMVY 0.25 % SOLN Place 1 drop into both eyes daily as needed (irritation/inflammation).  No current facility-administered medications for this visit.    Allergies as of 11/09/2023 - Review Complete 11/09/2023  Allergen Reaction Noted   Sectral [acebutolol hcl] Other (See Comments) 09/14/2012   Sulfonamide derivatives Other (See Comments)    Zocor [simvastatin] Other (See Comments) 09/14/2012    Social History   Socioeconomic History   Marital status: Married    Spouse name: Not on file   Number of children: Not on file   Years of education: Not on file   Highest education level: Not on file  Occupational History   Occupation: retired   Tobacco Use   Smoking status: Former    Current packs/day: 0.00    Average packs/day: 0.3 packs/day for 3.0 years (0.8 ttl pk-yrs)    Types: Cigarettes    Start date: 03/30/1966    Quit date: 03/30/1969    Years since quitting: 54.6    Passive exposure: Past   Smokeless tobacco: Never  Vaping Use   Vaping status: Never Used  Substance and Sexual Activity   Alcohol  use: Not Currently    Alcohol /week: 14.0 standard drinks of alcohol     Types: 7 Glasses of wine, 7 Shots of liquor per week    Comment: Jack nightly   Drug use: No   Sexual activity: Yes  Other Topics Concern   Not on file  Social History Narrative   Not on file   Social Drivers of Health   Financial Resource Strain: Low Risk  (11/12/2020)   Overall Financial Resource Strain (CARDIA)    Difficulty of Paying Living Expenses: Not hard at all  Food Insecurity: No Food Insecurity (10/26/2022)   Hunger Vital Sign    Worried About Running Out of Food in the Last Year: Never true    Ran Out of Food in the Last Year: Never true  Transportation Needs: No Transportation Needs (10/26/2022)   PRAPARE - Administrator, Civil Service (Medical): No    Lack of Transportation (Non-Medical): No  Physical Activity: Sufficiently Active (11/25/2021)   Exercise Vital Sign    Days of Exercise per Week: 5 days    Minutes of Exercise per Session: 30 min  Stress: No Stress Concern Present (11/25/2021)   [employer] of Occupational Health - Occupational Stress Questionnaire    Feeling of Stress : Not at all  Social Connections: Socially Integrated (11/25/2021)   Social Connection and Isolation Panel    Frequency of Communication with Friends and Family: More than three times a week    Frequency of Social Gatherings with Friends and Family: More than three times a week    Attends Religious Services: More than 4 times per year    Active Member of [employer] or Organizations: Yes    Attends Engineer, structural: More  than 4 times per year    Marital Status: Married    Review of systems General: negative for malaise, night sweats, fever, chills, weight loss Neck: Negative for lumps, goiter, pain and significant neck swelling Resp: Negative for cough, wheezing, dyspnea at rest CV: Negative for chest pain, leg swelling, palpitations, orthopnea GI: denies melena, hematochezia, nausea, vomiting, diarrhea, constipation, dysphagia, odyonophagia, early satiety or unintentional weight loss.  The remainder of the review of systems is noncontributory.  Physical Exam: BP (!) 104/59   Pulse 71   Temp 97.6 F (36.4 C)   Ht 5' 4 (1.626 m)   Wt 188 lb (85.3 kg)   BMI 32.27 kg/m  General:   Alert  and oriented. No distress noted. Pleasant and cooperative.  Head:  Normocephalic and atraumatic. Eyes:  Conjuctiva clear without scleral icterus. Mouth:  Oral mucosa pink and moist. Good dentition. No lesions. Heart: irregular rhythm, normal rate  Lungs: Clear lung sounds in all lobes. Respirations equal and unlabored. Abdomen:  +BS, soft, non-tender and non-distended. No rebound or guarding. No HSM or masses noted. Neurologic:  Alert and  oriented x4 Psych:  Alert and cooperative. Normal mood and affect.  Invalid input(s): 6 MONTHS   ASSESSMENT: [name] is a 83 y.o. male presenting today for follow up of Dry mouth and GERD  GERD: well controlled on protonix  40mg  BID. No breakthrough, dysphagia or odynophagia. Appetite good, weight stable. Continue PPI BID, good reflux precautions  Dry mouth: since drinking scalding hot coffee in November. previously prescribed magic mouthwash which he is unsure if he completed but notes much improvement in dry mouth, very rarely has an issue with this now. He should make me aware if symptoms return.    PLAN:  -continue protonix  40mg  BID -Increase water  intake, aim for atleast 64 oz per day -Increase fruits, veggies and whole grains, kiwi and prunes are especially  good for constipation -continue metamucil nightly   All questions were answered, patient verbalized understanding and is in agreement with plan as outlined above.   Follow Up: 1 year   Tuyen Uncapher L. Mariette, MSN, APRN, AGNP-C Adult-Gerontology Nurse Practitioner [facility]  I have reviewed the note and agree with the APP's assessment as described in this progress note  Toribio Fortune, MD Gastroenterology and Hepatology [facility]astroenter[facility]

## 2023-11-09 NOTE — Patient Instructions (Signed)
-  continue protonix  40mg  twice daily -Increase water  intake, aim for atleast 64 oz per day -Increase fruits, veggies and whole grains, kiwi and prunes are especially good for constipation -continue metamucil nightly  Follow up 1 year  It was a pleasure to see you today. I want to create trusting relationships with patients and provide genuine, compassionate, and quality care. I truly value your feedback! please be on the lookout for a survey regarding your visit with me today. I appreciate your input about our visit and your time in completing this!    Coline Calkin L. Ewelina Naves, MSN, APRN, AGNP-C Adult-Gerontology Nurse Practitioner [facility]

## 2023-11-24 ENCOUNTER — Ambulatory Visit: Attending: Internal Medicine | Admitting: Internal Medicine

## 2023-11-24 VITALS — BP 118/64 | HR 70 | Ht 66.0 in | Wt 186.8 lb

## 2023-11-24 DIAGNOSIS — I4819 Other persistent atrial fibrillation: Secondary | ICD-10-CM

## 2023-11-24 NOTE — Progress Notes (Signed)
 HPI [name] is referred by Laymon Qua, NP-C for evaluation of atrial fib. He was a former patient of Dr. Willam who underwent catheter ablation for symptomatic atrial fib. He developed worsening fatigue and weakness and was found to be back in atrial fib. He did not have palpitations. The patient has CAD and underwent PCI about a month ago. He still feels poorly. He stopped the ASA but remains on plavix  and eliquis .  Allergies  Allergen Reactions   Sectral [Acebutolol Hcl] Other (See Comments)    Unknown reaction   Sulfonamide Derivatives Other (See Comments)    Runs blood pressure up   Zocor [Simvastatin] Other (See Comments)    Joint pain     Current Outpatient Medications  Medication Sig Dispense Refill   amLODipine  (NORVASC ) 10 MG tablet Take 1 tablet (10 mg total) by mouth daily. 90 tablet 1   clopidogrel  (PLAVIX ) 75 MG tablet Take 1 tablet (75 mg total) by mouth daily. 90 tablet 3   diphenhydramine -acetaminophen  (TYLENOL  PM) 25-500 MG TABS tablet Take 2 tablets by mouth at bedtime.     ELIQUIS  5 MG TABS tablet TAKE 1 TABLET BY MOUTH TWICE A DAY. STOP PRADAXA  60 tablet 5   indapamide  (LOZOL ) 1.25 MG tablet Take 1 tablet (1.25 mg total) by mouth daily. 90 tablet 1   latanoprost  (XALATAN ) 0.005 % ophthalmic solution Place 1 drop into both eyes at bedtime.     metoprolol  succinate (TOPROL -XL) 25 MG 24 hr tablet TAKE 1 TABLET BY MOUTH TWICE A DAY 180 tablet 2   Multiple Vitamin (MULTIVITAMIN WITH MINERALS) TABS tablet Take 1 tablet by mouth 3 (three) times a week.     nitroGLYCERIN  (NITROSTAT ) 0.4 MG SL tablet Place 1 tablet (0.4 mg total) under the tongue every 5 (five) minutes as needed for chest pain. 25 tablet 3   Oxycodone  HCl 10 MG TABS 1 taken 4 times daily as needed for pain 120 tablet 0   Oxycodone  HCl 10 MG TABS 1 tablet taken 3 or 4 times daily as needed for pain caution drowsiness 120 tablet 0   Oxycodone  HCl 10 MG TABS 1 taken 4 times daily as needed pain  120 tablet 0   pantoprazole  (PROTONIX ) 40 MG tablet Take 1 tablet (40 mg total) by mouth 2 (two) times daily. 180 tablet 1   pravastatin  (PRAVACHOL ) 80 MG tablet Take 1 tablet (80 mg total) by mouth daily. 90 tablet 1   psyllium (METAMUCIL SMOOTH TEXTURE) 58.6 % powder Take 1 packet by mouth at bedtime.     ramipril  (ALTACE ) 10 MG capsule Take 10 mg by mouth 2 (two) times daily.     sucralfate  (CARAFATE ) 1 g tablet CRUSH MIX IN WATER  USE THREE TIMES DAY FOR 30 DAYS 270 tablet 1   tamsulosin  (FLOMAX ) 0.4 MG CAPS capsule Take 1 capsule (0.4 mg total) by mouth 2 (two) times daily. 180 capsule 3   torsemide  (DEMADEX ) 10 MG tablet Take 1 tablet (10 mg total) by mouth daily. 30 tablet 5   XDEMVY 0.25 % SOLN Place 1 drop into both eyes daily as needed (irritation/inflammation).     valsartan  (DIOVAN ) 160 MG tablet Take 1 tablet (160 mg total) by mouth daily. (Patient not taking: Reported on 11/24/2023) 90 tablet 3   No current facility-administered medications for this visit.     Past Medical History:  Diagnosis Date   Arthritis    Cancer (HCC)    skin   Carpal tunnel syndrome  Cataract    left eye   Chronic back pain    multiple back surgeries   Complication of anesthesia    pt states woke up during hemorroid surgery and stopped breathing during his back surgery   DVT (deep venous thrombosis) (HCC) 03/2007   left leg    Dysrhythmia    GERD (gastroesophageal reflux disease)    takes Protonix  daily   Glaucoma    History of colon polyps    History of hyperglycemia    History of MRSA infection 1992   HTN (hypertension)    takes  Ramipril  daily   Hyperlipidemia    takes Pravastatin  3 times a week   Nocturia    Paroxysmal atrial fibrillation (HCC)    takes Pradaxa  daily, Chads2vasc score 2(07/11/14)   Pneumonia    many yrs ago   Pulmonary embolism (HCC) 2008    ROS:   All systems reviewed and negative except as noted in the HPI.   Past Surgical History:  Procedure Laterality  Date   APPLICATION OF ROBOTIC ASSISTANCE FOR SPINAL PROCEDURE  05/06/2021   Procedure: APPLICATION OF ROBOTIC ASSISTANCE FOR SPINAL PROCEDURE;  Surgeon: Colon Shove, MD;  Location: [facility];  Service: Neurosurgery;;   BACK SURGERY     BIOPSY  04/01/2018   Procedure: BIOPSY;  Surgeon: Golda Claudis PENNER, MD;  [facility];  Service: Endoscopy;;  gastric   BIOPSY  05/14/2023   Procedure: BIOPSY;  Surgeon: Cinderella Deatrice FALCON, MD;  [facility];  Service: Endoscopy;;   CARDIAC CATHETERIZATION  05/18/2006   CARPAL TUNNEL RELEASE Right    cataract surgery Right    cervical and lumbar surgery     COLONOSCOPY  04/24/2011   Procedure: COLONOSCOPY;  Surgeon: Claudis PENNER Golda, MD;  [facility];  Service: Endoscopy;  Laterality: N/A;  1200   COLONOSCOPY N/A 08/13/2016   Procedure: COLONOSCOPY;  Surgeon: Claudis PENNER Golda, MD;  [facility];  Service: Endoscopy;  Laterality: N/A;  1200   COLONOSCOPY WITH PROPOFOL  N/A 12/22/2022   Procedure: COLONOSCOPY WITH PROPOFOL ;  Surgeon: Eartha Angelia Sieving, MD;  [facility];  Service: Gastroenterology;  Laterality: N/A;  11:15am;asa 3   CORONARY STENT INTERVENTION N/A 08/18/2023   Procedure: CORONARY STENT INTERVENTION;  Surgeon: Verlin Lonni BIRCH, MD;  [facility]: [facility];  Service: Cardiovascular;  Laterality: N/A;   ESOPHAGOGASTRODUODENOSCOPY  03/10/2012   Procedure: ESOPHAGOGASTRODUODENOSCOPY (EGD);  Surgeon: Claudis PENNER Golda, MD;  [facility];  Service: Endoscopy;  Laterality: N/A;  325   ESOPHAGOGASTRODUODENOSCOPY (EGD) WITH PROPOFOL  N/A 04/01/2018   Procedure: ESOPHAGOGASTRODUODENOSCOPY (EGD) WITH PROPOFOL ;  Surgeon: Golda Claudis PENNER, MD;  [facility];  Service: Endoscopy;  Laterality: N/A;   ESOPHAGOGASTRODUODENOSCOPY (EGD) WITH PROPOFOL  N/A 05/14/2023   Procedure: ESOPHAGOGASTRODUODENOSCOPY (EGD) WITH PROPOFOL ;  Surgeon: Cinderella Deatrice FALCON, MD;  [facility];  Service: Endoscopy;  Laterality: N/A;  8:00am;asa 3   EYE SURGERY     HARDWARE REMOVAL N/A 04/24/2014   Procedure: REMOVAL OF SACRAL INSTRUMENTATION WITH METREX;  Surgeon: Shove Colon, MD;  [facility]: [facility];  Service: Neurosurgery;  Laterality: N/A;   HEMORRHOIDECTOMY WITH HEMORRHOID BANDING     JOINT REPLACEMENT     KNEE ARTHROSCOPY Right    KNEE ARTHROSCOPY WITH MEDIAL MENISECTOMY Right 08/03/2018   Procedure: RIGHT KNEE ARTHROSCOPY WITH PARTIAL MEDIAL MENISCECTOMY;  Surgeon: Jerri Kay HERO, MD;  Location: [facility];  Service: Orthopedics;  Laterality:  Right;   KNEE SURGERY     left   LASIK     POLYPECTOMY  08/13/2016   Procedure: POLYPECTOMY;  Surgeon: Claudis RAYMOND Rivet, MD;  [facility];  Service: Endoscopy;;  colon   POLYPECTOMY  12/22/2022   Procedure: POLYPECTOMY;  Surgeon: Eartha Angelia Sieving, MD;  [facility];  Service: Gastroenterology;;   POLYPECTOMY  05/14/2023   Procedure: POLYPECTOMY;  Surgeon: Cinderella Deatrice FALCON, MD;  [facility];  Service: Endoscopy;;   PVI  01/24/2010   afib ablation   RIGHT/LEFT HEART CATH AND CORONARY ANGIOGRAPHY N/A 08/18/2023   Procedure: RIGHT/LEFT HEART CATH AND CORONARY ANGIOGRAPHY;  Surgeon: Verlin Lonni BIRCH, MD;  [facility];  Service: Cardiovascular;  Laterality: N/A;   TRANSFORAMINAL LUMBAR INTERBODY FUSION (TLIF) WITH PEDICLE SCREW FIXATION 1 LEVEL N/A 05/06/2021   Procedure: Transforaminal Lumbar Interbody Fusion  - Thoracic twelve-Lumbar one fixation Thoracic nine to Lumbar one with methacrylate augmentation with mazor;  Surgeon: Colon Shove, MD;  Location: [facility];  Service: Neurosurgery;  Laterality: N/A;     Family History  Problem Relation Age of Onset   Hypertension Mother    Transient ischemic attack Mother    Heart disease Father    Diabetes Brother    Healthy Daughter    Colon cancer Sister    Hypertension Other    Heart disease Other       Social History   Socioeconomic History   Marital status: Married    Spouse name: Not on file   Number of children: Not on file   Years of education: Not on file   Highest education level: Not on file  Occupational History   Occupation: retired  Tobacco Use   Smoking status: Former    Current packs/day: 0.00    Average packs/day: 0.3 packs/day for 3.0 years (0.8 ttl pk-yrs)    Types: Cigarettes    Start date: 03/30/1966    Quit date: 03/30/1969    Years since quitting: 54.6    Passive exposure: Past   Smokeless tobacco: Never  Vaping Use   Vaping status: Never Used  Substance and Sexual Activity   Alcohol  use: Not Currently    Alcohol /week: 14.0 standard drinks of alcohol     Types: 7 Glasses of wine, 7 Shots of liquor per week    Comment: Jack nightly   Drug use: No   Sexual activity: Yes  Other Topics Concern   Not on file  Social History Narrative   Not on file   Social Drivers of Health   Financial Resource Strain: Low Risk  (11/12/2020)   Overall Financial Resource Strain (CARDIA)    Difficulty of Paying Living Expenses: Not hard at all  Food Insecurity: No Food Insecurity (10/26/2022)   Hunger Vital Sign    Worried About Running Out of Food in the Last Year: Never true    Ran Out of Food in the Last Year: Never true  Transportation Needs: No Transportation Needs (10/26/2022)   PRAPARE - Administrator, Civil Service (Medical): No    Lack of Transportation (Non-Medical): No  Physical Activity: Sufficiently Active (11/25/2021)   Exercise Vital Sign    Days of Exercise per Week: 5 days    Minutes of Exercise per Session: 30 min  Stress: No Stress Concern Present (11/25/2021)   [employer] of Occupational Health - Occupational Stress Questionnaire    Feeling of Stress : Not at all  Social Connections:  Socially Integrated (11/25/2021)   Social Connection and Isolation Panel    Frequency of Communication with Friends and Family: More than three  times a week    Frequency of Social Gatherings with Friends and Family: More than three times a week    Attends Religious Services: More than 4 times per year    Active Member of [employer] or Organizations: Yes    Attends Engineer, structural: More than 4 times per year    Marital Status: Married  Catering manager Violence: Not At Risk (10/26/2022)   Humiliation, Afraid, Rape, and Kick questionnaire    Fear of Current or Ex-Partner: No    Emotionally Abused: No    Physically Abused: No    Sexually Abused: No     BP 118/64   Pulse 70   Ht 5' 6 (1.676 m)   Wt 186 lb 12.8 oz (84.7 kg)   SpO2 90%   BMI 30.15 kg/m   Physical Exam:  Well appearing NAD HEENT: Unremarkable Neck:  No JVD, no thyromegally Lymphatics:  No adenopathy Back:  No CVA tenderness Lungs:  Clear with no wheezes HEART:  Regular rate rhythm, no murmurs, no rubs, no clicks Abd:  soft, positive bowel sounds, no organomegally, no rebound, no guarding Ext:  2 plus pulses, no edema, no cyanosis, no clubbing Skin:  No rashes no nodules Neuro:  CN II through XII intact, motor grossly intact  EKG - atrial fib with a controlled VR.  Assess/Plan: Symptomatic atrial fib - he does not have palpitations but his CHF symptoms are worse. Class 3. I have offered him dofetilide vs repeat ablation. He will call us  if he would like to come in for dofetilide. CAD - he denies anginal symptoms. Coags - he has not missed his xarelto.   Danelle Ell Tiso,MD

## 2023-11-24 NOTE — Patient Instructions (Addendum)
 Medication Instructions:  Your physician recommends that you continue on your current medications as directed. Please refer to the Current Medication list given to you today.  Please call Iva at [phone]  *If you need a refill on your cardiac medications before your next appointment, please call your pharmacy*  Lab Work: NONE   If you have labs (blood work) drawn today and your tests are completely normal, you will receive your results only by: MyChart Message (if you have MyChart) OR A paper copy in the mail If you have any lab test that is abnormal or we need to change your treatment, we will call you to review the results.  Testing/Procedures: NONE   Follow-Up: At [facility], you and your health needs are our priority.  As part of our continuing mission to provide you with exceptional heart care, our providers are all part of one team.  This team includes your primary Cardiologist (physician) and Advanced Practice Providers or APPs (Physician Assistants and Nurse Practitioners) who all work together to provide you with the care you need, when you need it.  Your next appointment:    Pending   Provider:   Danelle Birmingham, MD    We recommend signing up for the patient portal called MyChart.  Sign up information is provided on this After Visit Summary.  MyChart is used to connect with patients for Virtual Visits (Telemedicine).  Patients are able to view lab/test results, encounter notes, upcoming appointments, etc.  Non-urgent messages can be sent to your provider as well.   To learn more about what you can do with MyChart, go to [email or web address].   Other Instructions Thank you for choosing [facility]!

## 2023-11-25 ENCOUNTER — Telehealth: Payer: Self-pay | Admitting: Pharmacist

## 2023-11-25 ENCOUNTER — Encounter (HOSPITAL_COMMUNITY): Payer: Self-pay | Admitting: *Deleted

## 2023-11-25 NOTE — Telephone Encounter (Signed)
 Medication list reviewed in anticipation of upcoming Tikosyn initiation. Patient is taking some contraindicated or QTc prolonging medications.   Indapamide - is a thiazide like diuretic and may increase the serum concentration of Tikosyn and increase QTc prolonging effects. Patient will need to stop at least 3 days prior to admission.  Torsemide - monitor potassium and magnesium  closely. Please ensure K is at least 4 and Mag at least 2 prior to and during treatment with Tikosyn  Tylenol -PM- has diphenhydramine  in it and needs to be avoided due to increased QTc risk  Patient is anticoagulated on Eliquis  on the appropriate dose. Please ensure that patient has not missed any anticoagulation doses in the 3 weeks prior to Tikosyn initiation.   Patient will need to be counseled to avoid use of Benadryl  while on Tikosyn and in the 2-3 days prior to Tikosyn initiation.

## 2023-11-29 ENCOUNTER — Ambulatory Visit (INDEPENDENT_AMBULATORY_CARE_PROVIDER_SITE_OTHER): Payer: PPO | Admitting: Gastroenterology

## 2023-12-04 ENCOUNTER — Other Ambulatory Visit: Payer: Self-pay | Admitting: Urology

## 2023-12-07 ENCOUNTER — Ambulatory Visit (INDEPENDENT_AMBULATORY_CARE_PROVIDER_SITE_OTHER): Payer: PPO | Admitting: Gastroenterology

## 2023-12-08 NOTE — Telephone Encounter (Signed)
Left message to sch

## 2023-12-13 ENCOUNTER — Telehealth (HOSPITAL_COMMUNITY): Payer: Self-pay

## 2023-12-13 NOTE — Telephone Encounter (Signed)
 Faxed clinical information to [employer] for review. Tikosyn admission: date of service 01/03/2024  Authorization is pending for review.

## 2023-12-15 ENCOUNTER — Encounter: Payer: Self-pay | Admitting: Family Medicine

## 2023-12-15 ENCOUNTER — Ambulatory Visit: Admitting: Family Medicine

## 2023-12-15 VITALS — BP 136/78 | HR 69 | Temp 98.0°F | Ht 66.0 in | Wt 185.0 lb

## 2023-12-15 DIAGNOSIS — E7849 Other hyperlipidemia: Secondary | ICD-10-CM

## 2023-12-15 DIAGNOSIS — Z79899 Other long term (current) drug therapy: Secondary | ICD-10-CM | POA: Diagnosis not present

## 2023-12-15 DIAGNOSIS — R0609 Other forms of dyspnea: Secondary | ICD-10-CM | POA: Diagnosis not present

## 2023-12-15 DIAGNOSIS — I480 Paroxysmal atrial fibrillation: Secondary | ICD-10-CM

## 2023-12-15 DIAGNOSIS — Z79891 Long term (current) use of opiate analgesic: Secondary | ICD-10-CM

## 2023-12-15 MED ORDER — OXYCODONE HCL 10 MG PO TABS: ORAL_TABLET | ORAL | 0 refills | Status: DC

## 2023-12-15 NOTE — Progress Notes (Signed)
 Subjective:    Patient ID: [name], male    DOB: Mar 13, 1941, 83 y.o.   MRN: [id number]  HPI This patient was seen today for chronic pain  The medication list was reviewed and updated.   Location of Pain for which the patient has been treated with regarding narcotics: right shoulder and back   Onset of this pain: Present for years   -Compliance with medication: Good compliance  - Number patient states they take daily: 4  -Reason for ongoing use of opioids : Right shoulder and back pain  What other measures have been tried outside of opioids surgery, injection, Tylenol    In the ongoing specialists regarding this condition back specialist  -when was the last dose patient took? 9am today  The patient was advised the importance of maintaining medication and not using illegal substances with these.  Here for refills and follow up  The patient was educated that we can provide 3 monthly scripts for their medication, it is their responsibility to follow the instructions.  Side effects or complications from medications: Denies side effects  Patient is aware that pain medications are meant to minimize the severity of the pain to allow their pain levels to improve to allow for better function. They are aware of that pain medications cannot totally remove their pain.  Due for UDT ( at least once per year) (pain management contract is also completed at the time of the UDT): 03/16/23  Scale of 1 to 10 ( 1 is least 10 is most) Your pain level without the medicine: 9 Your pain level with medication 3  Scale 1 to 10 ( 1-helps very little, 10 helps very well) How well does your pain medication reduce your pain so you can function better through out the day? 6-7  Quality of the pain: Throbbing aching  Persistence of the pain: Present all time  Modifying factors: Worse with activity    12/15/2023   10:11 AM 09/08/2023   10:37 AM 06/16/2023   11:04 AM  PHQ9 SCORE ONLY  PHQ-9  Total Score 15 15 8    He denies being depressed he just states he has a lot of pain discomfort which makes his quality of life poor He is not thinking about hurting himself DOE (dyspnea on exertion)  Other hyperlipidemia  Encounter for long-term use of opiate analgesic  Paroxysmal atrial fibrillation (HCC)  High risk medication use  His heart at times gets a little fast but other times just out of rhythm he states he has a procedure coming up in August to try to help He gets out of breath when he pushes himself it is altered related to his atrial fit History of hyperlipidemia PDMP was checked     Review of Systems     Objective:   Physical Exam General-in no acute distress Eyes-no discharge Lungs-respiratory rate normal, CTA CV-no murmurs,RRR Extremities skin warm dry no edema Neuro grossly normal Behavior normal, alert        Assessment & Plan:  Will do labs before his follow-up visit 1. DOE (dyspnea on exertion) (Primary) More than likely related to his atrial fibs and inefficiency of cardiovascular system as well as deconditioning if this gets worse will evaluate lungs closer previously had some interstitial findings  2. Other hyperlipidemia Try to keep LDL below 70 if possible  3. Encounter for long-term use of opiate analgesic The patient was seen in followup for chronic pain. A review over at their current pain status  was discussed. Drug registry was checked. Prescriptions were given.  Regular follow-up recommended. Discussion was held regarding the importance of compliance with medication as well as pain medication contract.  Patient was informed that medication may cause drowsiness and should not be combined  with other medications/alcohol  or street drugs. If the patient feels medication is causing altered alertness then do not drive or operate dangerous equipment.  Should be noted that the patient appears to be meeting appropriate use of opioids and  response.  Evidenced by improved function and decent pain control without significant side effects and no evidence of overt aberrancy issues.  Upon discussion with the patient today they understand that opioid therapy is optional and they feel that the pain has been refractory to reasonable conservative measures and is significant and affecting quality of life enough to warrant ongoing therapy and wishes to continue opioids.  Refills were provided.  [place]  medical Board guidelines regarding the pain medicine has been reviewed.  CDC guidelines most updated 2022 has been reviewed by the prescriber.  PDMP is checked on a regular basis yearly urine drug screen and pain management contract  Treatment plan for this patient includes #1-gentle stretching exercises as shown daily basis 2.  Mild strength exercises 3 times per week #3 continue pain medications #4 notify us  if any digression   4. Paroxysmal atrial fibrillation (HCC) Follow through with cardiology procedure in August  5. High risk medication use Labs before next visit

## 2023-12-16 ENCOUNTER — Other Ambulatory Visit: Payer: Self-pay

## 2023-12-16 DIAGNOSIS — I4891 Unspecified atrial fibrillation: Secondary | ICD-10-CM

## 2023-12-16 DIAGNOSIS — Z79899 Other long term (current) drug therapy: Secondary | ICD-10-CM

## 2023-12-16 DIAGNOSIS — E7849 Other hyperlipidemia: Secondary | ICD-10-CM

## 2023-12-22 ENCOUNTER — Other Ambulatory Visit: Payer: Self-pay | Admitting: Family Medicine

## 2023-12-22 DIAGNOSIS — K219 Gastro-esophageal reflux disease without esophagitis: Secondary | ICD-10-CM

## 2023-12-24 NOTE — Telephone Encounter (Signed)
 Tikosyn admission has been approved. Authorization # [id number]

## 2023-12-31 ENCOUNTER — Encounter (HOSPITAL_COMMUNITY): Payer: Self-pay

## 2024-01-03 ENCOUNTER — Encounter (HOSPITAL_COMMUNITY): Payer: Self-pay | Admitting: Cardiology

## 2024-01-03 ENCOUNTER — Telehealth (HOSPITAL_COMMUNITY): Payer: Self-pay | Admitting: *Deleted

## 2024-01-03 ENCOUNTER — Inpatient Hospital Stay (HOSPITAL_COMMUNITY)
Admission: AD | Admit: 2024-01-03 | Discharge: 2024-01-06 | DRG: 309 | Disposition: A | Discharge status: Home / Self Care | Attending: Internal Medicine | Admitting: Internal Medicine

## 2024-01-03 ENCOUNTER — Encounter (HOSPITAL_COMMUNITY): Payer: Self-pay | Admitting: Physician Assistant

## 2024-01-03 ENCOUNTER — Ambulatory Visit (HOSPITAL_COMMUNITY)
Admission: RE | Admit: 2024-01-03 | Discharge: 2024-01-03 | Disposition: A | Discharge status: Home / Self Care | Source: Ambulatory Visit | Attending: Physician Assistant | Admitting: Physician Assistant

## 2024-01-03 VITALS — BP 136/90 | HR 76 | Ht 66.0 in | Wt 185.4 lb

## 2024-01-03 DIAGNOSIS — Z860100 Personal history of colon polyps, unspecified: Secondary | ICD-10-CM

## 2024-01-03 DIAGNOSIS — R7303 Prediabetes: Secondary | ICD-10-CM | POA: Diagnosis not present

## 2024-01-03 DIAGNOSIS — I4819 Other persistent atrial fibrillation: Principal | ICD-10-CM | POA: Diagnosis present

## 2024-01-03 DIAGNOSIS — Z7901 Long term (current) use of anticoagulants: Secondary | ICD-10-CM | POA: Diagnosis not present

## 2024-01-03 DIAGNOSIS — D6869 Other thrombophilia: Secondary | ICD-10-CM | POA: Diagnosis not present

## 2024-01-03 DIAGNOSIS — I10 Essential (primary) hypertension: Secondary | ICD-10-CM | POA: Diagnosis present

## 2024-01-03 DIAGNOSIS — E785 Hyperlipidemia, unspecified: Secondary | ICD-10-CM | POA: Diagnosis not present

## 2024-01-03 DIAGNOSIS — M549 Dorsalgia, unspecified: Secondary | ICD-10-CM | POA: Diagnosis present

## 2024-01-03 DIAGNOSIS — G8929 Other chronic pain: Secondary | ICD-10-CM | POA: Diagnosis present

## 2024-01-03 DIAGNOSIS — Z882 Allergy status to sulfonamides status: Secondary | ICD-10-CM

## 2024-01-03 DIAGNOSIS — R350 Frequency of micturition: Secondary | ICD-10-CM | POA: Diagnosis not present

## 2024-01-03 DIAGNOSIS — Z888 Allergy status to other drugs, medicaments and biological substances status: Secondary | ICD-10-CM | POA: Diagnosis not present

## 2024-01-03 DIAGNOSIS — K219 Gastro-esophageal reflux disease without esophagitis: Secondary | ICD-10-CM | POA: Diagnosis not present

## 2024-01-03 DIAGNOSIS — Z79899 Other long term (current) drug therapy: Secondary | ICD-10-CM | POA: Diagnosis not present

## 2024-01-03 DIAGNOSIS — I2510 Atherosclerotic heart disease of native coronary artery without angina pectoris: Secondary | ICD-10-CM | POA: Diagnosis not present

## 2024-01-03 LAB — BASIC METABOLIC PANEL WITH GFR
BUN/Creatinine Ratio: 21 (ref 10–24)
BUN: 21 mg/dL (ref 8–27)
CO2: 26 mmol/L (ref 20–29)
Calcium: 9.1 mg/dL (ref 8.6–10.2)
Chloride: 106 mmol/L (ref 96–106)
Creatinine, Ser: 0.98 mg/dL (ref 0.76–1.27)
Glucose: 163 mg/dL — ABNORMAL HIGH (ref 70–99)
Potassium: 4.5 mmol/L (ref 3.5–5.2)
Sodium: 141 mmol/L (ref 134–144)
eGFR: 77 mL/min/1.73 (ref >59)

## 2024-01-03 LAB — MAGNESIUM: Magnesium: 2.1 mg/dL (ref 1.6–2.3)

## 2024-01-03 MED ORDER — OXYCODONE HCL 5 MG PO TABS
10.0000 mg | ORAL_TABLET | Freq: Three times a day (TID) | ORAL | Status: DC | PRN
  Administered 2024-01-04 – 2024-01-06 (×5): 10 mg via ORAL
  Filled 2024-01-03 (×5): qty 2

## 2024-01-03 MED ORDER — LATANOPROST 0.005 % OP SOLN
1.0000 [drp] | Freq: Every day | OPHTHALMIC | Status: DC
  Administered 2024-01-03 – 2024-01-05 (×3): 1 [drp] via OPHTHALMIC
  Filled 2024-01-03: qty 2.5

## 2024-01-03 MED ORDER — OXYCODONE HCL 10 MG PO TABS: 10.0000 mg | ORAL_TABLET | Freq: Three times a day (TID) | ORAL | Status: DC | PRN

## 2024-01-03 MED ORDER — NITROGLYCERIN 0.4 MG SL SUBL: 0.4000 mg | SUBLINGUAL_TABLET | SUBLINGUAL | Status: DC | PRN

## 2024-01-03 MED ORDER — RAMIPRIL 5 MG PO CAPS
10.0000 mg | ORAL_CAPSULE | Freq: Two times a day (BID) | ORAL | Status: DC
  Administered 2024-01-04 – 2024-01-06 (×5): 10 mg via ORAL
  Filled 2024-01-03 (×7): qty 2

## 2024-01-03 MED ORDER — SODIUM CHLORIDE 0.9 % IV SOLN: 250.0000 mL | INTRAVENOUS | Status: AC | PRN

## 2024-01-03 MED ORDER — APIXABAN 5 MG PO TABS
5.0000 mg | ORAL_TABLET | Freq: Two times a day (BID) | ORAL | Status: DC
  Administered 2024-01-03 – 2024-01-06 (×6): 5 mg via ORAL
  Filled 2024-01-03 (×6): qty 1

## 2024-01-03 MED ORDER — PANTOPRAZOLE SODIUM 40 MG PO TBEC
40.0000 mg | DELAYED_RELEASE_TABLET | Freq: Every day | ORAL | Status: DC
  Administered 2024-01-04 – 2024-01-06 (×3): 40 mg via ORAL
  Filled 2024-01-03 (×3): qty 1

## 2024-01-03 MED ORDER — SODIUM CHLORIDE 0.9% FLUSH
3.0000 mL | Freq: Two times a day (BID) | INTRAVENOUS | Status: DC
  Administered 2024-01-03 – 2024-01-05 (×4): 3 mL via INTRAVENOUS

## 2024-01-03 MED ORDER — PRAVASTATIN SODIUM 40 MG PO TABS
80.0000 mg | ORAL_TABLET | Freq: Every day | ORAL | Status: DC
  Administered 2024-01-04 – 2024-01-06 (×3): 80 mg via ORAL
  Filled 2024-01-03 (×3): qty 2

## 2024-01-03 MED ORDER — PSYLLIUM 95 % PO PACK
1.0000 | PACK | Freq: Every day | ORAL | Status: DC
  Administered 2024-01-03: 1 via ORAL
  Filled 2024-01-03 (×4): qty 1

## 2024-01-03 MED ORDER — DOFETILIDE 250 MCG PO CAPS
250.0000 ug | ORAL_CAPSULE | Freq: Two times a day (BID) | ORAL | Status: DC
  Administered 2024-01-03 – 2024-01-06 (×6): 250 ug via ORAL
  Filled 2024-01-03 (×6): qty 1

## 2024-01-03 MED ORDER — LOTILANER 0.25 % OP SOLN: 1.0000 [drp] | Freq: Every day | OPHTHALMIC | Status: DC | PRN

## 2024-01-03 MED ORDER — TAMSULOSIN HCL 0.4 MG PO CAPS
0.4000 mg | ORAL_CAPSULE | Freq: Two times a day (BID) | ORAL | Status: DC
  Administered 2024-01-03 – 2024-01-06 (×6): 0.4 mg via ORAL
  Filled 2024-01-03 (×6): qty 1

## 2024-01-03 MED ORDER — AMLODIPINE BESYLATE 10 MG PO TABS
10.0000 mg | ORAL_TABLET | Freq: Every day | ORAL | Status: DC
  Administered 2024-01-04 – 2024-01-06 (×3): 10 mg via ORAL
  Filled 2024-01-03 (×3): qty 1

## 2024-01-03 MED ORDER — CLOPIDOGREL BISULFATE 75 MG PO TABS
75.0000 mg | ORAL_TABLET | Freq: Every day | ORAL | Status: DC
  Administered 2024-01-04 – 2024-01-06 (×3): 75 mg via ORAL
  Filled 2024-01-03 (×3): qty 1

## 2024-01-03 MED ORDER — SODIUM CHLORIDE 0.9% FLUSH: 3.0000 mL | INTRAVENOUS | Status: DC | PRN

## 2024-01-03 MED ORDER — METOPROLOL SUCCINATE ER 25 MG PO TB24
25.0000 mg | ORAL_TABLET | Freq: Two times a day (BID) | ORAL | Status: DC
  Administered 2024-01-04 – 2024-01-06 (×5): 25 mg via ORAL
  Filled 2024-01-03 (×6): qty 1

## 2024-01-03 NOTE — H&P (Addendum)
 Primary Care Physician: Alphonsa Glendia LABOR, MD Primary Cardiologist: Diannah SHAUNNA Maywood, MD Electrophysiologist: Danelle Birmingham, MD  Referring Physician: Dr Birmingham Ellender [name] is a 83 y.o. male with a history of CAD, DVT/PE, HTN, HLD, atrial fibrillation who presents for follow up in the [facility].  The patient is s/p afib ablation with Dr Kelsie in 2011. He was seen by Dr Birmingham for recurrent afib and dofetilide  was recommended. Patient is on Eliquis  for stroke prevention.    Patient presents today for follow up for atrial fibrillation and dofetilide  loading. He remains in rate controlled afib today. He denies palpitations but does have symptoms of fatigue. No bleeding issues on anticoagulation.   Today, he denies symptoms of palpitations, chest pain, shortness of breath, orthopnea, PND, lower extremity edema, dizziness, presyncope, syncope, snoring, daytime somnolence, bleeding, or neurologic sequela. The patient is tolerating medications without difficulties and is otherwise without complaint today.    Atrial Fibrillation Risk Factors:  he does not have symptoms or diagnosis of sleep apnea. he does not have a history of rheumatic fever.   Atrial Fibrillation Management history:  Previous antiarrhythmic drugs: none Previous cardioversions: none Previous ablations: 01/24/10 Anticoagulation history: warfarin, Eliquis   ROS- All systems are reviewed and negative except as per the HPI above.  Past Medical History:  Diagnosis Date   Arthritis    Cancer (HCC)    skin   Carpal tunnel syndrome    Cataract    left eye   Chronic back pain    multiple back surgeries   Complication of anesthesia    pt states woke up during hemorroid surgery and stopped breathing during his back surgery   DVT (deep venous thrombosis) (HCC) 03/2007   left leg    Dysrhythmia    GERD (gastroesophageal reflux disease)    takes Protonix  daily   Glaucoma    History of  colon polyps    History of hyperglycemia    History of MRSA infection 1992   HTN (hypertension)    takes  Ramipril  daily   Hyperlipidemia    takes Pravastatin  3 times a week   Nocturia    Paroxysmal atrial fibrillation (HCC)    takes Pradaxa  daily, Chads2vasc score 2(07/11/14)   Pneumonia    many yrs ago   Pulmonary embolism (HCC) 2008    Current Facility-Administered Medications  Medication Dose Route Frequency Provider Last Rate Last Admin   0.9 %  sodium chloride  infusion  250 mL Intravenous PRN Birmingham Danelle ORN, MD       [START ON 01/04/2024] amLODipine  (NORVASC ) tablet 10 mg  10 mg Oral Daily Fenton, Clint R, PA       apixaban  (ELIQUIS ) tablet 5 mg  5 mg Oral BID Birmingham Danelle ORN, MD       [START ON 01/04/2024] clopidogrel  (PLAVIX ) tablet 75 mg  75 mg Oral Daily Fenton, Clint R, PA       dofetilide  (TIKOSYN ) capsule 250 mcg  250 mcg Oral BID Birmingham Danelle ORN, MD       latanoprost  (XALATAN ) 0.005 % ophthalmic solution 1 drop  1 drop Both Eyes QHS Fenton, Clint R, PA       Lotilaner  0.25 % SOLN 1 drop  1 drop Both Eyes Daily PRN Fenton, Clint R, PA       metoprolol  succinate (TOPROL -XL) 24 hr tablet 25 mg  25 mg Oral BID Fenton, Clint R, PA       nitroGLYCERIN  (  NITROSTAT ) SL tablet 0.4 mg  0.4 mg Sublingual Q5 min PRN Fenton, Clint R, PA       Oxycodone  HCl TABS 10 mg  10 mg Oral TID PRN Fenton, Clint R, PA       [START ON 01/04/2024] pantoprazole  (PROTONIX ) EC tablet 40 mg  40 mg Oral QAC breakfast Fenton, Clint R, PA       [START ON 01/04/2024] pravastatin  (PRAVACHOL ) tablet 80 mg  80 mg Oral Daily Fenton, Clint R, PA       psyllium (HYDROCIL/METAMUCIL) 1 packet  1 packet Oral QHS Fenton, Clint R, PA       ramipril  (ALTACE ) capsule 10 mg  10 mg Oral BID Fenton, Clint R, PA       sodium chloride  flush (NS) 0.9 % injection 3 mL  3 mL Intravenous Q12H [place] Danelle ORN, MD       sodium chloride  flush (NS) 0.9 % injection 3 mL  3 mL Intravenous PRN [place]addell Danelle ORN, MD       tamsulosin   (FLOMAX ) capsule 0.4 mg  0.4 mg Oral BID Fenton, Clint R, PA        Physical Exam: There were no vitals taken for this visit.  GEN: Well nourished, well developed in no acute distress CARDIAC: Irregularly irregular rate and rhythm, no murmurs, rubs, gallops RESPIRATORY:  Clear to auscultation without rales, wheezing or rhonchi  ABDOMEN: Soft, non-tender, non-distended EXTREMITIES:  No edema; No deformity   Wt Readings from Last 3 Encounters:  01/03/24 84.1 kg  12/15/23 83.9 kg  11/24/23 84.7 kg     EKG today demonstrates  Afib Vent. rate 76 BPM PR interval * ms QRS duration 92 ms QT/QTcB 404/454 ms   Echo 06/11/23 demonstrated   1. Left ventricular ejection fraction, by estimation, is 60 to 65%. The  left ventricle has normal function. The left ventricle has no regional  wall motion abnormalities. There is moderate left ventricular hypertrophy.  Left ventricular diastolic function could not be evaluated.   2. Right ventricular systolic function is low normal. The right  ventricular size is mildly enlarged. Tricuspid regurgitation signal is  inadequate for assessing PA pressure.   3. Left atrial size was severely dilated.   4. Right atrial size was severely dilated.   5. The mitral valve is normal in structure. Mild mitral valve  regurgitation. No evidence of mitral stenosis.   6. The aortic valve has an indeterminant number of cusps. There is  moderate calcification of the aortic valve. Aortic valve regurgitation is  trivial. Mild aortic valve stenosis. Aortic valve area, by VTI measures  1.96 cm. Aortic valve mean gradient  measures 11.0 mmHg. Aortic valve Vmax measures 2.05 m/s.   7. The inferior vena cava is normal in size with greater than 50%  respiratory variability, suggesting right atrial pressure of 3 mmHg.     CHA2DS2-VASc Score = 3  The patient's score is based upon: CHF History: 0 HTN History: 1 Diabetes History: 0 Stroke History: 0 Vascular Disease  History: 0 Age Score: 2 Gender Score: 0       ASSESSMENT AND PLAN: Persistent Atrial Fibrillation (ICD10:  I48.19) The patient's CHA2DS2-VASc score is 3, indicating a 3.2% annual risk of stroke.   S/p ablation 2011 Patient presents for dofetilide  admission Continue Eliquis  5 mg BID, states no missed doses in the last 3 weeks. No recent benadryl  use PharmD has screened medications for QT prolonging agents. He has discontinued indapamide .  QTc in SR  430 ms Labs pending Continue Toprol  25 mg BID  Secondary Hypercoagulable State (ICD10:  D68.69) The patient is at significant risk for stroke/thromboembolism based upon his CHA2DS2-VASc Score of 3.  Continue Apixaban  (Eliquis ).   CAD No anginal symptoms Followed by Dr Mallipeddi  HTN Stable on current regimen   To be admitted later today once a bed becomes available.      [facility] [address] [place], [place] [address] [phone]    ADDEND: Patient has arrived Accompanied by his daughter Confirms no missed doses of his Eliquis  in 3 weeks or longer Fatigued, more SOB, no energy in AFib No CP No rest SOB Confirms he stopped indapamide  and Tylenol  PM as instructed Labs reviewed Creat is better then his baseline > qualifies for dose though historically his creat is higher, (likely 2/2 stopping his diuretic). D/w Dr. Kennyth, given advanced age and historically higher Creat, perhaps need for another diuretic in the future > will continue with 250mcg dose  EKG reviewed, Ok to start  Charlies Arthur, PA-C   I have seen, examined the patient, and reviewed the above assessment and plan.    HPI: [name] is a 83 y.o. male with a history of CAD, DVT/PE, HTN, HLD, atrial fibrillation s/p ablation with Dr. Kelsie in 2011 who presents today for planned Tikosyn  admission. Patient has no new or acute complaints today.   General: Well developed, in no acute distress.  Neck: No JVD.  Cardiac:  Normal rate, irregular rhythm.  Resp: Normal work of breathing.  Ext: No edema.  Neuro: No gross focal deficits.  Psych: Normal affect.   Cr: 0.98 K: 4.5  EKG: AF, Qtc  Assessment:  Persistent atrial fibrillation Secondary hypercoagulable state due to atrial fibrillation High risk medication use  Plan:  - Start Tikosyn  250 mcg with ECG 2 hours after each dose.  - Continue routine monitoring of kidney function and electrolytes, replete as necessary.  - Continue Eliquis  5mg  BID. - If patient remains in atrial fibrillation after 4th dose, then we will arrange for cardioversion.  Fonda Kennyth, MD 01/03/2024 10:28 PM

## 2024-01-03 NOTE — Plan of Care (Signed)

## 2024-01-03 NOTE — Progress Notes (Addendum)
 Transition of Care [facility]) - Inpatient Brief Assessment   Patient Details  Name: [name] MRN: [id number] Date of Birth: 1940/07/21  Transition of Care [facility]) CM/SW Contact:    Rosaline JONELLE Joe, RN Phone Number: 01/03/2024, 4:31 PM   Clinical Narrative: Patient admitted to the hospital with persistent atrial fibrillation.  Patient lives at home with spouse and adult daughter.  Patient is normally independent, drives and has a cane at home.  PCP - Dr. Glendia Fielding  Home pharmacy - [facility] on [address], [place], [place]  I placed a benefits check request with Reyes Sharps with pharmacy today.  CM with IP Care management team will continue to follow the patient for needs to return home with family when stable.   Transition of Care Asessment: Insurance and Status: (P) Insurance coverage has been reviewed Patient has primary care physician: (P) Yes Home environment has been reviewed: (P) from home with spouse Prior level of function:: (P) self Prior/Current Home Services: (P) No current home services (DME at home -[place]) Social Drivers of Health Review: (P) SDOH reviewed needs interventions Readmission risk has been reviewed: (P) Yes Transition of care needs: (P) transition of care needs identified, TOC will continue to follow

## 2024-01-03 NOTE — Progress Notes (Signed)
 Pharmacy: Dofetilide  (Tikosyn ) - Initial Consult Assessment and Electrolyte Replacement  Pharmacy consulted to assist in monitoring and replacing electrolytes in this 83 y.o. male admitted on 01/03/2024 undergoing dofetilide  initiation. First dofetilide  dose: 01/03/2024  Assessment:  Patient Exclusion Criteria: If any screening criteria checked as Yes, then  patient  should NOT receive dofetilide  until criteria item is corrected.  If "Yes" please indicate correction plan.  YES  NO Patient  Exclusion Criteria Correction Plan/Comments   []   [x]   Baseline QTc interval is greater than or equal to 440 msec. IF above YES box checked dofetilide  contraindicated unless patient has ICD; then may proceed if QTc 500-550 msec or with known ventricular conduction abnormalities may proceed with QTc 550-600 msec. QTc = 430    []   [x]   Patient is known or suspected to have a digoxin level greater than 2 ng/ml: No results found for: DIGOXIN     []   [x]   Creatinine clearance less than 20 ml/min (calculated using Cockcroft-Gault, actual body weight and serum creatinine): Estimated Creatinine Clearance: 56.5 mL/min (by C-G formula based on SCr of 0.98 mg/dL).     []   []  Patient has received drugs known to prolong the QT intervals within the last 48 hour (examples: phenothiazines, tricyclics or tetracyclic antidepressants, macrolides, 1st generation H-1 antihistamines (especially diphenhydramine ), fluoroquinolones, azoles, ondansetron , metoclopramide, promethazine ).   Updated information on QT prolonging agents is available to be searched on the following database:QT prolonging agents -If SSRI or antihistamine needed, preferred options are sertraline and loratadine respectively     []   [x]  Patient received a dose of a thiazide diuretic in the last 48 hours [including hydrochlorothiazide (Oretic) alone or in any combination including triamterene (Dyazide, Maxzide)]. Pt's daughter confirmed no longer  taking indapamide    []   [x]  Patient received a medication known to increase dofetilide  plasma concentrations prior to initial dofetilide  dose:  Trimethoprim (Primsol, Proloprim) in the last 36 hours Verapamil  (Calan , Verelan ) in the last 36 hours or a sustained release dose in the last 72 hours Megestrol (Megace) in the last 5 days  Cimetidine (Tagamet) in the last 6 hours Ketoconazole (Nizoral) in the last 24 hours Itraconazole (Sporanox) in the last 48 hours  Prochlorperazine (Compazine) in the last 36 hours     []   [x]   Patient is known to have a history of torsades de pointes; congenital or acquired long QT syndromes.    []   [x]   Patient has received a Class 1 and Class 3 antiarrhythmic with less than 2 half-lives since last dose. (Disopyramide, Quinidine, Procainamide, Lidocaine , Mexiletine, Flecainide, Propafenone, Sotalol, Dronedarone)    []   [x]   Patient has received amiodarone therapy in the past 3 months or amiodarone level is greater than 0.3 ng/ml.    Labs:    Component Value Date/Time   K 4.5 01/03/2024 0830   MG 2.1 01/03/2024 0830     Plan: Select One Calculated CrCl  Dose q12h  [x]  > 60 ml/min 500 mcg  []  40-60 ml/min 250 mcg  []  20-40 ml/min 125 mcg   []   Physician selected initial dose within range recommended for patients level of renal function - will monitor for response.  [x]   Physician selected initial dose outside of range recommended for patients level of renal function - will discuss if the dose should be altered at this time.   Patient has been appropriately anticoagulated with apixaban  5mg  BID.  Potassium: K >/= 4: Appropriate to initiate Tikosyn , no replacement needed  Magnesium : Mg >2: Appropriate to initiate Tikosyn , no replacement needed     Thank you for allowing pharmacy to participate in this patient's care   [name] [name] 01/03/2024  6:41 PM

## 2024-01-03 NOTE — Progress Notes (Signed)
 Primary Care Physician: Alphonsa Glendia LABOR, MD Primary Cardiologist: Diannah SHAUNNA Maywood, MD Electrophysiologist: Danelle Birmingham, MD  Referring Physician: Dr Birmingham Ellender [name] is a 83 y.o. male with a history of CAD, DVT/PE, HTN, HLD, atrial fibrillation who presents for follow up in the [facility].  The patient is s/p afib ablation with Dr Kelsie in 2011. He was seen by Dr Birmingham for recurrent afib and dofetilide  was recommended. Patient is on Eliquis  for stroke prevention.    Patient presents today for follow up for atrial fibrillation and dofetilide  loading. He remains in rate controlled afib today. He denies palpitations but does have symptoms of fatigue. No bleeding issues on anticoagulation.   Today, he denies symptoms of palpitations, chest pain, shortness of breath, orthopnea, PND, lower extremity edema, dizziness, presyncope, syncope, snoring, daytime somnolence, bleeding, or neurologic sequela. The patient is tolerating medications without difficulties and is otherwise without complaint today.    Atrial Fibrillation Risk Factors:  he does not have symptoms or diagnosis of sleep apnea. he does not have a history of rheumatic fever.   Atrial Fibrillation Management history:  Previous antiarrhythmic drugs: none Previous cardioversions: none Previous ablations: 01/24/10 Anticoagulation history: warfarin, Eliquis   ROS- All systems are reviewed and negative except as per the HPI above.  Past Medical History:  Diagnosis Date   Arthritis    Cancer (HCC)    skin   Carpal tunnel syndrome    Cataract    left eye   Chronic back pain    multiple back surgeries   Complication of anesthesia    pt states woke up during hemorroid surgery and stopped breathing during his back surgery   DVT (deep venous thrombosis) (HCC) 03/2007   left leg    Dysrhythmia    GERD (gastroesophageal reflux disease)    takes Protonix  daily   Glaucoma    History of  colon polyps    History of hyperglycemia    History of MRSA infection 1992   HTN (hypertension)    takes  Ramipril  daily   Hyperlipidemia    takes Pravastatin  3 times a week   Nocturia    Paroxysmal atrial fibrillation (HCC)    takes Pradaxa  daily, Chads2vasc score 2(07/11/14)   Pneumonia    many yrs ago   Pulmonary embolism (HCC) 2008    Current Outpatient Medications  Medication Sig Dispense Refill   amLODipine  (NORVASC ) 10 MG tablet Take 1 tablet (10 mg total) by mouth daily. 90 tablet 1   clopidogrel  (PLAVIX ) 75 MG tablet Take 1 tablet (75 mg total) by mouth daily. 90 tablet 3   ELIQUIS  5 MG TABS tablet TAKE 1 TABLET BY MOUTH TWICE A DAY. STOP PRADAXA  60 tablet 5   indapamide  (LOZOL ) 1.25 MG tablet Take 1 tablet (1.25 mg total) by mouth daily. 90 tablet 1   latanoprost  (XALATAN ) 0.005 % ophthalmic solution Place 1 drop into both eyes at bedtime.     metoprolol  succinate (TOPROL -XL) 25 MG 24 hr tablet TAKE 1 TABLET BY MOUTH TWICE A DAY 180 tablet 2   nitroGLYCERIN  (NITROSTAT ) 0.4 MG SL tablet Place 1 tablet (0.4 mg total) under the tongue every 5 (five) minutes as needed for chest pain. 25 tablet 3   Oxycodone  HCl 10 MG TABS 1 tablet taken 3 or 4 times daily as needed for pain caution drowsiness 120 tablet 0   Oxycodone  HCl 10 MG TABS 1 taken 4 times daily as needed pain 120  tablet 0   Oxycodone  HCl 10 MG TABS 1 taken 4 times daily as needed for pain 120 tablet 0   pantoprazole  (PROTONIX ) 40 MG tablet TAKE 1 TABLET BY MOUTH EVERY DAY BEFORE BREAKFAST 90 tablet 1   pravastatin  (PRAVACHOL ) 80 MG tablet Take 1 tablet (80 mg total) by mouth daily. 90 tablet 1   psyllium (METAMUCIL SMOOTH TEXTURE) 58.6 % powder Take 1 packet by mouth at bedtime.     ramipril  (ALTACE ) 10 MG capsule Take 10 mg by mouth 2 (two) times daily.     tamsulosin  (FLOMAX ) 0.4 MG CAPS capsule TAKE 1 CAPSULE BY MOUTH 2 TIMES DAILY. 180 capsule 3   XDEMVY  0.25 % SOLN Place 1 drop into both eyes daily as needed  (irritation/inflammation).     torsemide  (DEMADEX ) 10 MG tablet Take 1 tablet (10 mg total) by mouth daily. (Patient not taking: Reported on 01/03/2024) 30 tablet 5   No current facility-administered medications for this encounter.    Physical Exam: BP (!) 136/90   Pulse 76   Ht 5' 6 (1.676 m)   Wt 84.1 kg   BMI 29.92 kg/m   GEN: Well nourished, well developed in no acute distress CARDIAC: Irregularly irregular rate and rhythm, no murmurs, rubs, gallops RESPIRATORY:  Clear to auscultation without rales, wheezing or rhonchi  ABDOMEN: Soft, non-tender, non-distended EXTREMITIES:  No edema; No deformity   Wt Readings from Last 3 Encounters:  01/03/24 84.1 kg  12/15/23 83.9 kg  11/24/23 84.7 kg     EKG today demonstrates  Afib Vent. rate 76 BPM PR interval * ms QRS duration 92 ms QT/QTcB 404/454 ms   Echo 06/11/23 demonstrated   1. Left ventricular ejection fraction, by estimation, is 60 to 65%. The  left ventricle has normal function. The left ventricle has no regional  wall motion abnormalities. There is moderate left ventricular hypertrophy.  Left ventricular diastolic function could not be evaluated.   2. Right ventricular systolic function is low normal. The right  ventricular size is mildly enlarged. Tricuspid regurgitation signal is  inadequate for assessing PA pressure.   3. Left atrial size was severely dilated.   4. Right atrial size was severely dilated.   5. The mitral valve is normal in structure. Mild mitral valve  regurgitation. No evidence of mitral stenosis.   6. The aortic valve has an indeterminant number of cusps. There is  moderate calcification of the aortic valve. Aortic valve regurgitation is  trivial. Mild aortic valve stenosis. Aortic valve area, by VTI measures  1.96 cm. Aortic valve mean gradient  measures 11.0 mmHg. Aortic valve Vmax measures 2.05 m/s.   7. The inferior vena cava is normal in size with greater than 50%  respiratory  variability, suggesting right atrial pressure of 3 mmHg.     CHA2DS2-VASc Score = 3  The patient's score is based upon: CHF History: 0 HTN History: 1 Diabetes History: 0 Stroke History: 0 Vascular Disease History: 0 Age Score: 2 Gender Score: 0       ASSESSMENT AND PLAN: Persistent Atrial Fibrillation (ICD10:  I48.19) The patient's CHA2DS2-VASc score is 3, indicating a 3.2% annual risk of stroke.   S/p ablation 2011 Patient presents for dofetilide  admission Continue Eliquis  5 mg BID, states no missed doses in the last 3 weeks. No recent benadryl  use PharmD has screened medications for QT prolonging agents. He has discontinued indapamide .  QTc in SR 430 ms Labs pending Continue Toprol  25 mg BID  Secondary Hypercoagulable State (  ICD10:  D68.69) The patient is at significant risk for stroke/thromboembolism based upon his CHA2DS2-VASc Score of 3.  Continue Apixaban  (Eliquis ).   CAD No anginal symptoms Followed by Dr Mallipeddi  HTN Stable on current regimen   To be admitted later today once a bed becomes available.      [facility] [address] [place], [place] [address] [phone]

## 2024-01-04 ENCOUNTER — Telehealth (HOSPITAL_COMMUNITY): Payer: Self-pay | Admitting: Pharmacy Technician

## 2024-01-04 ENCOUNTER — Other Ambulatory Visit (HOSPITAL_COMMUNITY): Payer: Self-pay

## 2024-01-04 ENCOUNTER — Other Ambulatory Visit: Payer: Self-pay

## 2024-01-04 DIAGNOSIS — I4819 Other persistent atrial fibrillation: Secondary | ICD-10-CM | POA: Diagnosis not present

## 2024-01-04 LAB — BASIC METABOLIC PANEL WITH GFR
Anion gap: 12 (ref 5–15)
BUN: 19 mg/dL (ref 8–23)
CO2: 23 mmol/L (ref 22–32)
Calcium: 9.3 mg/dL (ref 8.9–10.3)
Chloride: 105 mmol/L (ref 98–111)
Creatinine, Ser: 1.06 mg/dL (ref 0.61–1.24)
GFR, Estimated: >60 mL/min (ref >60)
Glucose, Bld: 131 mg/dL — ABNORMAL HIGH (ref 70–99)
Potassium: 4 mmol/L (ref 3.5–5.1)
Sodium: 140 mmol/L (ref 135–145)

## 2024-01-04 LAB — MAGNESIUM: Magnesium: 2 mg/dL (ref 1.7–2.4)

## 2024-01-04 LAB — MRSA NEXT GEN BY PCR, NASAL: MRSA by PCR Next Gen: NOT DETECTED

## 2024-01-04 MED ORDER — MAGNESIUM SULFATE 2 GM/50ML IV SOLN
2.0000 g | Freq: Once | INTRAVENOUS | Status: AC
  Administered 2024-01-04: 2 g via INTRAVENOUS
  Filled 2024-01-04: qty 50

## 2024-01-04 NOTE — Progress Notes (Addendum)
 Post dose EKG is reviewed Remains in AFib 83bpm, QTc 486 Continue Tikosyn  load NPO after MN for poss DCCV if needed  Charlies Arthur, PA-C

## 2024-01-04 NOTE — Progress Notes (Signed)
 Mobility Specialist Progress Note;   01/04/24 1048  Mobility  Activity Ambulated with assistance  Level of Assistance Standby assist, set-up cues, supervision of patient - no hands on  Assistive Device Cane  Distance Ambulated (ft) 200 ft  Activity Response Tolerated well  Mobility Referral Yes  Mobility visit 1 Mobility  Mobility Specialist Start Time (ACUTE ONLY) 1048  Mobility Specialist Stop Time (ACUTE ONLY) 1055  Mobility Specialist Time Calculation (min) (ACUTE ONLY) 7 min   Pt agreeable to mobility. Required no physical assistance during ambulation, SV for safety. HR up to 107 bpm w/ activity. No c/o SOB when asked. Pt returned safely back to chair and left with all needs met, call bell in reach.   Lauraine Erm Mobility Specialist Please contact via SecureChat or [employer] [phone]

## 2024-01-04 NOTE — Progress Notes (Signed)
 Pharmacy: Dofetilide  (Tikosyn ) - Follow Up Assessment and Electrolyte Replacement  Pharmacy consulted to assist in monitoring and replacing electrolytes in this 83 y.o. male admitted on 01/03/2024 undergoing dofetilide  initiation.  Labs:    Component Value Date/Time   K 4.0 01/04/2024 0433   MG 2.0 01/04/2024 0433     Plan: Potassium: K >/= 4: No additional supplementation needed  Magnesium : Mg 1.8-2: Give Mg 2 gm IV x1    Thank you for allowing pharmacy to participate in this patient's care   Prentice Poisson, PharmD Clinical Pharmacist **Pharmacist phone directory can now be found on [email or web address] ([email or web address]).  Listed under [facility].

## 2024-01-04 NOTE — Telephone Encounter (Signed)
 Patient Product/process development scientist completed.    The patient is insured through [employer]/ [employer]. Patient has Medicare and is not eligible for a copay card, but may be able to apply for patient assistance or Medicare RX Payment Plan (Patient Must reach out to their plan, if eligible for payment plan), if available.    Ran test claim for dofetilide (Tikosyn) 250 mcg and the current 30 day co-pay is $65.68.   This test claim was processed through [facility]- copay amounts may vary at other pharmacies due to pharmacy/plan contracts, or as the patient moves through the different stages of their insurance plan.     Roland Earl, CPHT Pharmacy Technician III Certified Patient Advocate [facility] Patient Advocate Team Direct Number: [phone]  Fax: [phone]

## 2024-01-04 NOTE — Progress Notes (Addendum)
 Rounding Note   Patient Name: [name] Date of Encounter: 01/04/2024  [facility] Cardiologist: Diannah SHAUNNA Maywood, MD   Subjective  Up through the night w/frequent urination, no symptoms otherwise  Scheduled Meds:  amLODipine   10 mg Oral Daily   apixaban   5 mg Oral BID   clopidogrel   75 mg Oral Daily   dofetilide   250 mcg Oral BID   latanoprost   1 drop Both Eyes QHS   metoprolol  succinate  25 mg Oral BID   pantoprazole   40 mg Oral QAC breakfast   pravastatin   80 mg Oral Daily   psyllium  1 packet Oral QHS   ramipril   10 mg Oral BID   sodium chloride  flush  3 mL Intravenous Q12H   tamsulosin   0.4 mg Oral BID   Continuous Infusions:  sodium chloride      PRN Meds: sodium chloride , nitroGLYCERIN , oxyCODONE , sodium chloride  flush   Vital Signs  Vitals:   01/03/24 1506 01/03/24 2013 01/04/24 0300 01/04/24 0448  BP:  121/70  (!) 155/78  Pulse:  70  81  Resp:  18  18  Temp:  98.1 F (36.7 C)  (!) 97.5 F (36.4 C)  TempSrc:  Oral  Oral  SpO2:  97%  98%  Weight:   84.1 kg   Height: 5' 4.5 (1.638 m)       Intake/Output Summary (Last 24 hours) at 01/04/2024 0741 Last data filed at 01/04/2024 0452 Gross per 24 hour  Intake 720 ml  Output --  Net 720 ml      01/04/2024    3:00 AM 01/03/2024    8:28 AM 12/15/2023   10:05 AM  Last 3 Weights  Weight (lbs) 185 lb 6.5 oz 185 lb 6.4 oz 185 lb  Weight (kg) 84.1 kg 84.097 kg 83.915 kg      Telemetry AFib 60's-80's - Personally Reviewed  ECG  AFib 68bpm, QTc - Personally Reviewed  Physical Exam  GEN: No acute distress.   Neck: No JVD Cardiac: irreg-irreg, no murmurs, rubs, or gallops.  Respiratory: Clear to auscultation bilaterally. GI: Soft, nontender, non-distended  MS: No edema; No deformity. Neuro:  Nonfocal  Psych: Normal affect   Labs High Sensitivity Troponin:  No results for input(s): TROPONINIHS in the last 720 hours.   Chemistry Recent Labs  Lab 01/03/24 0830  01/04/24 0433  NA 141 140  K 4.5 4.0  CL 106 105  CO2 26 23  GLUCOSE 163* 131*  BUN 21 19  CREATININE 0.98 1.06  CALCIUM 9.1 9.3  MG 2.1 2.0  GFRNONAA  --  >60  ANIONGAP  --  12    Lipids No results for input(s): CHOL, TRIG, HDL, LABVLDL, LDLCALC, CHOLHDL in the last 168 hours.  HematologyNo results for input(s): WBC, RBC, HGB, HCT, MCV, MCH, MCHC, RDW, PLT in the last 168 hours. Thyroid  No results for input(s): TSH, FREET4 in the last 168 hours.  BNPNo results for input(s): BNP, PROBNP in the last 168 hours.  DDimer No results for input(s): DDIMER in the last 168 hours.   Radiology  No results found.  Cardiac Studies  Echo 06/11/23  1. Left ventricular ejection fraction, by estimation, is 60 to 65%. The  left ventricle has normal function. The left ventricle has no regional  wall motion abnormalities. There is moderate left ventricular hypertrophy.  Left ventricular diastolic function could not be evaluated.   2. Right ventricular systolic function is low normal. The right  ventricular  size is mildly enlarged. Tricuspid regurgitation signal is  inadequate for assessing PA pressure.   3. Left atrial size was severely dilated.   4. Right atrial size was severely dilated.   5. The mitral valve is normal in structure. Mild mitral valve  regurgitation. No evidence of mitral stenosis.   6. The aortic valve has an indeterminant number of cusps. There is  moderate calcification of the aortic valve. Aortic valve regurgitation is  trivial. Mild aortic valve stenosis. Aortic valve area, by VTI measures  1.96 cm. Aortic valve mean gradient  measures 11.0 mmHg. Aortic valve Vmax measures 2.05 m/s.   7. The inferior vena cava is normal in size with greater than 50%  respiratory variability, suggesting right atrial pressure of 3 mmHg.  Patient Profile   83 y.o. male w/PMHx of  CAD (PCI to OM 08/18/23) DVT/PE, HTN, HLD AFib (hx of remote PVI  ablation)  Admitted for Tikosyn  initiation  Assessment & Plan   Persistent AFib CHA2DS2Vasc is 6, on Eliquis , appropriately dosed Tikosyn  load is in progress K+ 4.0 Mag 2.0 Creat 1.06 (better then baseline), discussed with Dr. Waddell > will continue 250mcg DCCV in the morning if not in SR Pt aware and agreeable  Informed Consent   Shared Decision Making/Informed Consent The risks (stroke, cardiac arrhythmias rarely resulting in the need for a temporary or permanent pacemaker, skin irritation or burns and complications associated with conscious sedation including aspiration, arrhythmia, respiratory failure and death), benefits (restoration of normal sinus rhythm) and alternatives of a direct current cardioversion were explained in detail to Mr. Redd and he agrees to proceed.       CAD No symptoms Home meds  HTN Home meds  For questions or updates, please contact [facility] Please consult [email or web address] for contact info under     Signed, Charlies Macario Arthur, PA-C  01/04/2024, 7:41 AM    EP Attending  Patient seen and examined. Agree with above. The patient remains in afib with a controlled VR. He will continue dofetilide . QT is acceptable. DCCV tomorrow if he has not reverted back to NSR.   Danelle Jalayiah Bibian,MD

## 2024-01-05 ENCOUNTER — Encounter (HOSPITAL_COMMUNITY): Payer: Self-pay

## 2024-01-05 ENCOUNTER — Encounter (HOSPITAL_COMMUNITY)
Admission: AD | Disposition: A | Discharge status: Home / Self Care | Payer: Self-pay | Source: Home / Self Care | Attending: Internal Medicine

## 2024-01-05 DIAGNOSIS — I4819 Other persistent atrial fibrillation: Secondary | ICD-10-CM | POA: Diagnosis not present

## 2024-01-05 LAB — BASIC METABOLIC PANEL WITH GFR
Anion gap: 9 (ref 5–15)
BUN: 21 mg/dL (ref 8–23)
CO2: 24 mmol/L (ref 22–32)
Calcium: 9 mg/dL (ref 8.9–10.3)
Chloride: 108 mmol/L (ref 98–111)
Creatinine, Ser: 1.09 mg/dL (ref 0.61–1.24)
GFR, Estimated: >60 mL/min (ref >60)
Glucose, Bld: 133 mg/dL — ABNORMAL HIGH (ref 70–99)
Potassium: 4.1 mmol/L (ref 3.5–5.1)
Sodium: 141 mmol/L (ref 135–145)

## 2024-01-05 LAB — MAGNESIUM: Magnesium: 2.3 mg/dL (ref 1.7–2.4)

## 2024-01-05 SURGERY — CARDIOVERSION (CATH LAB)
Anesthesia: General

## 2024-01-05 NOTE — Progress Notes (Addendum)
 Electrophysiology Rounding Note  Patient Name: [name] Date of Encounter: 01/05/2024  Primary Cardiologist: Vishnu P Mallipeddi, MD  Electrophysiologist: Danelle Birmingham, MD    Subjective   Pt converted to sinus rhythm on Tikosyn  250 mcg BID overnight 8/19-8/20.  QTc from EKG last pm shows stable QTc at ([place]).  The patient is doing well today.  At this time, the patient denies chest pain, shortness of breath, or any new concerns.  Inpatient Medications    Scheduled Meds:  amLODipine   10 mg Oral Daily   apixaban   5 mg Oral BID   clopidogrel   75 mg Oral Daily   dofetilide   250 mcg Oral BID   latanoprost   1 drop Both Eyes QHS   metoprolol  succinate  25 mg Oral BID   pantoprazole   40 mg Oral QAC breakfast   pravastatin   80 mg Oral Daily   psyllium  1 packet Oral QHS   ramipril   10 mg Oral BID   sodium chloride  flush  3 mL Intravenous Q12H   tamsulosin   0.4 mg Oral BID   Continuous Infusions:  PRN Meds: nitroGLYCERIN , oxyCODONE , sodium chloride  flush   Vital Signs    Vitals:   01/04/24 1152 01/04/24 1614 01/04/24 2026 01/05/24 0055  BP: 126/73 114/74 105/72 114/61  Pulse: 81 76 85 71  Resp: 18 17 17 16   Temp: 97.7 F (36.5 C) 98.2 F (36.8 C) 98.5 F (36.9 C) 97.7 F (36.5 C)  TempSrc: Oral Oral Oral Oral  SpO2: 96%  95% 96%  Weight:      Height:        Intake/Output Summary (Last 24 hours) at 01/05/2024 0727 Last data filed at 01/04/2024 2045 Gross per 24 hour  Intake 530.03 ml  Output --  Net 530.03 ml   Filed Weights   01/04/24 0300  Weight: 84.1 kg    Physical Exam    GEN- NAD, A&O x 3. Normal affect.  Lungs- CTAB, Normal effort.  Heart- Regular rate and rhythm. No M/G/R GI- Soft, NT, ND Extremities- No clubbing, cyanosis, or edema Skin- no rash or lesion  Labs    CBC No results for input(s): WBC, NEUTROABS, HGB, HCT, MCV, PLT in the last 72 hours. Basic Metabolic Panel Recent Labs    91/80/74 0433  01/05/24 0417  NA 140 141  K 4.0 4.1  CL 105 108  CO2 23 24  GLUCOSE 131* 133*  BUN 19 21  CREATININE 1.06 1.09  CALCIUM 9.3 9.0  MG 2.0 2.3    Telemetry    Sinus rhythm with first degree AVB with PACs post conversion. Last conversion appears to have been around 2219. Rates 50s-70s (personally reviewed)  Patient Profile     [name] is a 83 y.o. male with a past medical history significant for persistent atrial fibrillation.  They were admitted for tikosyn  load.   Assessment & Plan    Persistent atrial fibrillation Pt remains in NSR on Tikosyn  250 mcg BID  Continue Eliquis  Creatinine, ser  1.09 (08/20 0417) Magnesium   2.3 (08/20 0417) Potassium4.1 (08/20 0417) No electrolyte supplementation needed  Plan for home Thursday if QTc remains stable.   For questions or updates, please contact [facility] Please consult [email or web address] for contact info under Cardiology/STEMI.  Signed, Artist Pouch, PA-C  01/05/2024, 7:27 AM    EP Attending  Patient seen and examined. Lungs are clear and CV with a RRR and tele now with NSR. ECG shows NSR and QTC  of around 490. If the QT stays the same I would let him go home but if it goes out further would cut dose back to 125 bid.   Danelle Jerimey Burridge,MD

## 2024-01-05 NOTE — Progress Notes (Signed)
 Pharmacy: Dofetilide  (Tikosyn ) - Follow Up Assessment and Electrolyte Replacement  Pharmacy consulted to assist in monitoring and replacing electrolytes in this 83 y.o. male admitted on 01/03/2024 undergoing dofetilide  initiation.   Labs:    Component Value Date/Time   K 4.1 01/05/2024 0417   MG 2.3 01/05/2024 0417     Plan: Potassium: K >/= 4: No additional supplementation needed  Magnesium : Mg > 2: No additional supplementation needed   No potassium supplementation recommended at discharge.  Thank you for allowing pharmacy to participate in this patient's care   Prentice Poisson, PharmD Clinical Pharmacist **Pharmacist phone directory can now be found on [email or web address] ([email or web address]).  Listed under [facility].

## 2024-01-05 NOTE — Progress Notes (Addendum)
 Morning EKG reviewed     Shows remains in NSR with borderline QTc at ~490 ms.  Continue  Tikosyn  250 mcg BID for now. Will review with MD.  Potassium4.1 (08/20 0417) Magnesium   2.3 (08/20 0417) Creatinine, ser  1.09 (08/20 0417)  Plan for home Thursday if QTc remains stable   Artist Pouch, PA-C  01/05/2024 11:07 AM   11:30 AM Addendum: Reviewed AM EKG with Dr. Waddell. Will continue with Tikosyn  this evening and closely monitor QTc.

## 2024-01-06 ENCOUNTER — Other Ambulatory Visit (HOSPITAL_COMMUNITY): Payer: Self-pay

## 2024-01-06 DIAGNOSIS — I4819 Other persistent atrial fibrillation: Secondary | ICD-10-CM | POA: Diagnosis not present

## 2024-01-06 LAB — BASIC METABOLIC PANEL WITH GFR
Anion gap: 9 (ref 5–15)
BUN: 26 mg/dL — ABNORMAL HIGH (ref 8–23)
CO2: 26 mmol/L (ref 22–32)
Calcium: 9.2 mg/dL (ref 8.9–10.3)
Chloride: 104 mmol/L (ref 98–111)
Creatinine, Ser: 1.12 mg/dL (ref 0.61–1.24)
GFR, Estimated: >60 mL/min (ref >60)
Glucose, Bld: 133 mg/dL — ABNORMAL HIGH (ref 70–99)
Potassium: 4.2 mmol/L (ref 3.5–5.1)
Sodium: 139 mmol/L (ref 135–145)

## 2024-01-06 LAB — MAGNESIUM: Magnesium: 2 mg/dL (ref 1.7–2.4)

## 2024-01-06 MED ORDER — DOFETILIDE 250 MCG PO CAPS
250.0000 ug | ORAL_CAPSULE | Freq: Two times a day (BID) | ORAL | 6 refills | Status: DC
  Filled 2024-01-06: qty 60, 30d supply, fill #0

## 2024-01-06 MED ORDER — MAGNESIUM SULFATE 2 GM/50ML IV SOLN
2.0000 g | Freq: Once | INTRAVENOUS | Status: AC
  Administered 2024-01-06: 2 g via INTRAVENOUS
  Filled 2024-01-06: qty 50

## 2024-01-06 MED ORDER — MAGNESIUM OXIDE -MG SUPPLEMENT 400 (240 MG) MG PO TABS
400.0000 mg | ORAL_TABLET | Freq: Once | ORAL | Status: AC
  Administered 2024-01-06: 400 mg via ORAL
  Filled 2024-01-06: qty 1

## 2024-01-06 NOTE — Progress Notes (Signed)
 Pharmacy: Dofetilide  (Tikosyn ) - Follow Up Assessment and Electrolyte Replacement  Pharmacy consulted to assist in monitoring and replacing electrolytes in this 83 y.o. male admitted on 01/03/2024 undergoing dofetilide  initiation.   Labs:    Component Value Date/Time   K 4.2 01/06/2024 0444   MG 2.0 01/06/2024 0444     Plan: Potassium: K >/= 4: No additional supplementation needed  Magnesium : -Magnesium  oxide 400mg  x1 (IV Magnesium  was causing burning)   As patient has required no potassium replacement this admission and no supplementation recommended at discharge  Thank you for allowing pharmacy to participate in this patient's care    Prentice Poisson, PharmD Clinical Pharmacist **Pharmacist phone directory can now be found on [email or web address] ([email or web address]).  Listed under [facility].

## 2024-01-06 NOTE — Progress Notes (Addendum)
 EKG from yesterday evening 01/05/2024 reviewed     Shows remains in NSR with stable QTc at ~460-470 ms.  Continue  Tikosyn  250 mcg BID.   Potassium4.2 (08/21 0444) Magnesium   2.0 (08/21 0444) - Supp 2g Creatinine, ser  1.12 (08/21 0444)  Plan for home this afternoon if QTc remains stable.   Ozell Prentice Passey, PA-C  01/06/2024 7:14 AM

## 2024-01-06 NOTE — Discharge Summary (Signed)
 ELECTROPHYSIOLOGY DISCHARGE SUMMARY    Patient ID: [name],  MRN: [id number], DOB/AGE: 1940-11-29 83 y.o.  Admit date: 01/03/2024 Discharge date: 01/06/2024  Primary Care Physician: Alphonsa Glendia LABOR, MD  Primary Cardiologist: Vishnu P Mallipeddi, MD  Electrophysiologist: Dr. Waddell   Primary Discharge Diagnosis:  Persistent atrial fibrillation status post Tikosyn  loading this admission  Allergies  Allergen Reactions   Sectral [Acebutolol Hcl] Other (See Comments)    Unknown reaction   Sulfonamide Derivatives Other (See Comments)    Runs blood pressure up   Zocor [Simvastatin] Other (See Comments)    Joint pain     Procedures This Admission:  1.  Tikosyn  loading    Brief HPI: [name] is a 83 y.o. male with a past medical history as noted above.  They were referred to EP for treatment options of atrial fibrillation.  Risks, benefits, and alternatives to Tikosyn  were reviewed with the patient who wished to proceed with admission for loading.  Hospital Course:  The patient was admitted and Tikosyn  was initiated.  Renal function and electrolytes were followed during the hospitalization.  Their QTc remained stable. The patient converted chemically and did not require cardioversion. The patients QTc remained stable. They were monitored on telemetry up to discharge. On the day of discharge, they were examined by Dr. Cindie  who considered them stable for discharge to home.  Follow-up has been arranged with the Atrial Fibrillation clinic in approximately 1 week.   QT ~ 460 when measured manually. Some leads measured longer with artifact confounding.   Physical Exam: Vitals:   01/05/24 2020 01/05/24 2307 01/06/24 0800 01/06/24 1044  BP: (!) 116/58 120/63 (!) 142/75 (!) 148/92  Pulse: 75 81 (!) 58 64  Resp: 19 (!) 22 18   Temp: 97.6 F (36.4 C) 98.9 F (37.2 C) (!) 97.4 F (36.3 C)   TempSrc: Oral Oral Oral   SpO2: 98% 94% 97%   Weight:      Height:         GEN- NAD, A&O x 3. Normal affect.  Lungs- CTAB, Normal effort.  Heart- Regular rate and rhythm. No M/G/R GI- Soft, NT, ND Extremities- No clubbing, cyanosis, or edema Skin- no rash or lesion  Labs:   Lab Results  Component Value Date   WBC 4.8 08/12/2023   HGB 12.9 (L) 08/18/2023   HCT 38.0 (L) 08/18/2023   MCV 93.3 08/12/2023   PLT 208 08/12/2023    Recent Labs  Lab 01/06/24 0444  NA 139  K 4.2  CL 104  CO2 26  BUN 26*  CREATININE 1.12  CALCIUM 9.2  GLUCOSE 133*    Discharge Medications:  Allergies as of 01/06/2024       Reactions   Sectral [acebutolol Hcl] Other (See Comments)   Unknown reaction   Sulfonamide Derivatives Other (See Comments)   Runs blood pressure up   Zocor [simvastatin] Other (See Comments)   Joint pain        Medication List     TAKE these medications    amLODipine  10 MG tablet Commonly known as: NORVASC  Take 1 tablet (10 mg total) by mouth daily. What changed: when to take this   clopidogrel  75 MG tablet Commonly known as: Plavix  Take 1 tablet (75 mg total) by mouth daily.   dofetilide  250 MCG capsule Commonly known as: TIKOSYN  Take 1 capsule (250 mcg total) by mouth 2 (two) times daily.   Eliquis  5 MG Tabs tablet Generic  drug: apixaban  TAKE 1 TABLET BY MOUTH TWICE A DAY. STOP PRADAXA  What changed: See the new instructions.   latanoprost  0.005 % ophthalmic solution Commonly known as: XALATAN  Place 1 drop into both eyes at bedtime.   Metamucil Smooth Texture 58.6 % powder Generic drug: psyllium Take 1 packet by mouth at bedtime.   metoprolol  succinate 25 MG 24 hr tablet Commonly known as: TOPROL -XL TAKE 1 TABLET BY MOUTH TWICE A DAY   nitroGLYCERIN  0.4 MG SL tablet Commonly known as: Nitrostat  Place 1 tablet (0.4 mg total) under the tongue every 5 (five) minutes as needed for chest pain.   Oxycodone  HCl 10 MG Tabs 1 tablet taken 3 or 4 times daily as needed for pain caution drowsiness   pantoprazole  40 MG  tablet Commonly known as: PROTONIX  TAKE 1 TABLET BY MOUTH EVERY DAY BEFORE BREAKFAST What changed: See the new instructions.   polyethylene glycol 17 g packet Commonly known as: MIRALAX  / GLYCOLAX  Take 17 g by mouth at bedtime.   pravastatin  80 MG tablet Commonly known as: PRAVACHOL  Take 1 tablet (80 mg total) by mouth daily. What changed: when to take this   ramipril  10 MG capsule Commonly known as: ALTACE  Take 10 mg by mouth 2 (two) times daily.   tamsulosin  0.4 MG Caps capsule Commonly known as: FLOMAX  TAKE 1 CAPSULE BY MOUTH 2 TIMES DAILY.   Xdemvy  0.25 % Soln Generic drug: Lotilaner  Place 1 drop into both eyes daily as needed (irritation/inflammation).        Disposition:  Home with follow up in AF clinic in 1 week as in AVS.   Duration of Discharge Encounter:  APP time: 22 minutes  Signed, Ozell Prentice Passey, PA-C  01/06/2024 11:17 AM

## 2024-01-06 NOTE — Discharge Instructions (Signed)
 Dofetilide  Capsules What is this medication? DOFETILIDE  (doe FET il ide) treats a fast or irregular heartbeat (arrhythmia). It works by slowing down overactive electric signals in the heart, which stabilizes your heart rhythm. It belongs to a group of medications called antiarrhythmics. This medicine may be used for other purposes; ask your health care provider or pharmacist if you have questions. COMMON BRAND NAME(S): Tikosyn  What should I tell my care team before I take this medication? They need to know if you have any of these conditions: Heart disease History of irregular heartbeat History of low levels of potassium or magnesium  in the blood Kidney disease Liver disease An unusual or allergic reaction to dofetilide , other medications, foods, dyes, or preservatives Pregnant or trying to get pregnant Breast-feeding How should I use this medication? Take this medication by mouth with a glass of water. Follow the directions on the prescription label. Do not take with grapefruit juice. You can take it with or without food. If it upsets your stomach, take it with food. Take your medication at regular intervals. Do not take it more often than directed. Do not stop taking except on your care team's advice. A special MedGuide will be given to you by the pharmacist with each prescription and refill. Be sure to read this information carefully each time. Talk to your care team about the use of this medication in children. Special care may be needed. Overdosage: If you think you have taken too much of this medicine contact a poison control center or emergency room at once. NOTE: This medicine is only for you. Do not share this medicine with others. What if I miss a dose? If you miss a dose, skip it. Take your next dose at the normal time. Do not take extra or 2 doses at the same time to make up for the missed dose. What may interact with this medication? Do not take this medication with any of  the following: Benadryl (Diphenhydramine) Cimetidine (Tagamet) Cisapride Dolutegravir Dronedarone Erdafitinib Hydrochlorothiazide Immodium Ketoconazole Megestrol Pimozide Prochlorperazine Thioridazine Trimethoprim Verapamil This medication may also interact with the following: Amiloride Cannabinoids Certain antibiotics like erythromycin or clarithromycin  Certain antiviral medications for HIV or hepatitis Certain medications for depression, anxiety, or psychotic disorders Digoxin  Diltiazem  Grapefruit juice Metformin  Nefazodone Other medications that prolong the QT interval (an abnormal heart rhythm) Quinine Triamterene Zafirlukast Ziprasidone This list may not describe all possible interactions. Give your health care provider a list of all the medicines, herbs, non-prescription drugs, or dietary supplements you use. Also tell them if you smoke, drink alcohol, or use illegal drugs. Some items may interact with your medicine. What should I watch for while using this medication? Your condition will be monitored carefully while you are receiving this medication. What side effects may I notice from receiving this medication? Side effects that you should report to your care team as soon as possible: Allergic reactions--skin rash, itching, hives, swelling of the face, lips, tongue, or throat Chest pain Heart rhythm changes--fast or irregular heartbeat, dizziness, feeling faint or lightheaded, chest pain, trouble breathing Side effects that usually do not require medical attention (report to your care team if they continue or are bothersome): Dizziness Headache Nausea Stomach pain Trouble sleeping This list may not describe all possible side effects. Call your doctor for medical advice about side effects. You may report side effects to FDA at 1-800-FDA-1088. Where should I keep my medication? Keep out of the reach of children. Store at room temperature between 15  and 30  degrees C (59 and 86 degrees F). Throw away any unused medication after the expiration date. NOTE: This sheet is a summary. It may not cover all possible information. If you have questions about this medicine, talk to your doctor, pharmacist, or health care provider.  2024 Elsevier/Gold Standard (2021-04-04 00:00:00)

## 2024-01-06 NOTE — Plan of Care (Signed)

## 2024-01-06 NOTE — Care Management Important Message (Signed)
 Important Message  Patient Details  Name: [name] MRN: [id number] Date of Birth: 10/04/1940   Important Message Given:  Yes - Medicare IM     [name] Sharps 01/06/2024, 10:22 AM

## 2024-01-06 NOTE — Progress Notes (Signed)
 Mobility Specialist Progress Note:   01/06/24 0932  Mobility  Activity Ambulated with assistance  Level of Assistance Standby assist, set-up cues, supervision of patient - no hands on  Assistive Device Cane  Distance Ambulated (ft) 30 ft  Activity Response Tolerated well  Mobility Referral Yes  Mobility visit 1 Mobility  Mobility Specialist Start Time (ACUTE ONLY) 0932  Mobility Specialist Stop Time (ACUTE ONLY) [id number]  Mobility Specialist Time Calculation (min) (ACUTE ONLY) 10 min   Pt politely declining hallway ambulation at this time d/t fatigue and R shoulder pain. Able to ambulate short room distance with cane and supervision. Pt back in chair with all needs met.   Therisa Rana Mobility Specialist Please contact via SecureChat or  Rehab office at [phone]

## 2024-01-12 ENCOUNTER — Encounter: Payer: Self-pay | Admitting: Internal Medicine

## 2024-01-12 ENCOUNTER — Ambulatory Visit: Attending: Internal Medicine | Admitting: Internal Medicine

## 2024-01-12 VITALS — BP 144/70 | HR 58 | Ht 64.0 in | Wt 186.4 lb

## 2024-01-12 DIAGNOSIS — R0609 Other forms of dyspnea: Secondary | ICD-10-CM

## 2024-01-12 DIAGNOSIS — I2510 Atherosclerotic heart disease of native coronary artery without angina pectoris: Secondary | ICD-10-CM

## 2024-01-12 DIAGNOSIS — I10 Essential (primary) hypertension: Secondary | ICD-10-CM | POA: Diagnosis not present

## 2024-01-12 DIAGNOSIS — I480 Paroxysmal atrial fibrillation: Secondary | ICD-10-CM | POA: Diagnosis not present

## 2024-01-12 DIAGNOSIS — I350 Nonrheumatic aortic (valve) stenosis: Secondary | ICD-10-CM | POA: Diagnosis not present

## 2024-01-12 DIAGNOSIS — Z7901 Long term (current) use of anticoagulants: Secondary | ICD-10-CM | POA: Diagnosis not present

## 2024-01-12 NOTE — Progress Notes (Signed)
 Cardiology Office Note  Date: 01/12/2024   ID: Valon, Glasscock Nov 09, 1940, MRN [id number]  PCP:  Alphonsa Glendia LABOR, MD  Cardiologist:  Diannah SHAUNNA Maywood, MD Electrophysiologist:  Danelle Birmingham, MD   Reason for Office Visit: Follow-up of CAD, A-fib, HLD   History of Present Illness: CALUB TARNOW is a 83 y.o. male known to have paroxysmal A-fib s/p ablation in 2011 with recurrence of A-fib currently on Tikosyn , history of DVT/PE on Eliquis , HTN, HLD, mild aortic valve stenosis, chronic back pain (had multiple back surgeries) presented to cardiology clinic for follow-up.  CCTA cardiac in 2025 showed coronary calcium score of 3221, 89th percentile for age and sex matched control.  Total plaque volume is severe. CT cardiac showed multivessel CAD, flow-limiting lesions in the distal RCA and mid circumflex. LM assessment was limited on FFR. Distal part of left atrial appendage was not well-visualized, unable to rule out a left atrial appendage thrombus completely.  He underwent LHC, s/p OM PCI in April 2025.  Patient also underwent ablation for his A-fib in 2011 and has been in normal sinus rhythm until recently when he was found to be in A-fib in December 2024.  Due to symptoms of DOE, he was started on Tikosyn  by EP.  Subsequent EKG showed normal sinus rhythm with frequent PACs.  He is here today for follow-up visit.  Continues to have DOE but significantly improved after coronary stent was placed and was started on Tikosyn .  No angina.  No dizziness, palpitations, leg swelling or syncope.  Past Medical History:  Diagnosis Date   Arthritis    Cancer (HCC)    skin   Carpal tunnel syndrome    Cataract    left eye   Chronic back pain    multiple back surgeries   Complication of anesthesia    pt states woke up during hemorroid surgery and stopped breathing during his back surgery   DVT (deep venous thrombosis) (HCC) 03/2007   left leg    Dysrhythmia    GERD (gastroesophageal reflux  disease)    takes Protonix  daily   Glaucoma    History of colon polyps    History of hyperglycemia    History of MRSA infection 1992   HTN (hypertension)    takes  Ramipril  daily   Hyperlipidemia    takes Pravastatin  3 times a week   Nocturia    Paroxysmal atrial fibrillation (HCC)    takes Pradaxa  daily, Chads2vasc score 2(07/11/14)   Pneumonia    many yrs ago   Pulmonary embolism (HCC) 2008    Past Surgical History:  Procedure Laterality Date   APPLICATION OF ROBOTIC ASSISTANCE FOR SPINAL PROCEDURE  05/06/2021   Procedure: APPLICATION OF ROBOTIC ASSISTANCE FOR SPINAL PROCEDURE;  Surgeon: Colon Shove, MD;  [facility];  Service: Neurosurgery;;   BACK SURGERY     BIOPSY  04/01/2018   Procedure: BIOPSY;  Surgeon: Golda Claudis PENNER, MD;  [facility];  Service: Endoscopy;;  gastric   BIOPSY  05/14/2023   Procedure: BIOPSY;  Surgeon: Cinderella Deatrice FALCON, MD;  [facility];  Service: Endoscopy;;   CARDIAC CATHETERIZATION  05/18/2006   CARPAL TUNNEL RELEASE Right    cataract surgery Right    cervical and lumbar surgery     COLONOSCOPY  04/24/2011   Procedure: COLONOSCOPY;  Surgeon: Claudis PENNER Golda, MD;  [facility];  Service: Endoscopy;  Laterality: N/A;  1200   COLONOSCOPY N/A 08/13/2016  Procedure: COLONOSCOPY;  Surgeon: Claudis RAYMOND Rivet, MD;  [facility];  Service: Endoscopy;  Laterality: N/A;  1200   COLONOSCOPY WITH PROPOFOL  N/A 12/22/2022   Procedure: COLONOSCOPY WITH PROPOFOL ;  Surgeon: Eartha Angelia Sieving, MD;  [facility];  Service: Gastroenterology;  Laterality: N/A;  11:15am;asa 3   CORONARY STENT INTERVENTION N/A 08/18/2023   Procedure: CORONARY STENT INTERVENTION;  Surgeon: Verlin Lonni BIRCH, MD;  [facility];  Service: Cardiovascular;  Laterality: N/A;   ESOPHAGOGASTRODUODENOSCOPY  03/10/2012   Procedure: ESOPHAGOGASTRODUODENOSCOPY (EGD);  Surgeon: Claudis RAYMOND Rivet, MD;  [facility];  Service: Endoscopy;  Laterality: N/A;  325   ESOPHAGOGASTRODUODENOSCOPY (EGD) WITH PROPOFOL  N/A 04/01/2018   Procedure: ESOPHAGOGASTRODUODENOSCOPY (EGD) WITH PROPOFOL ;  Surgeon: Rivet Claudis RAYMOND, MD;  [facility];  Service: Endoscopy;  Laterality: N/A;   ESOPHAGOGASTRODUODENOSCOPY (EGD) WITH PROPOFOL  N/A 05/14/2023   Procedure: ESOPHAGOGASTRODUODENOSCOPY (EGD) WITH PROPOFOL ;  Surgeon: Cinderella Deatrice FALCON, MD;  [facility];  Service: Endoscopy;  Laterality: N/A;  8:00am;asa 3   EYE SURGERY     HARDWARE REMOVAL N/A 04/24/2014   Procedure: REMOVAL OF SACRAL INSTRUMENTATION WITH METREX;  Surgeon: Victory Gens, MD;  [facility];  Service: Neurosurgery;  Laterality: N/A;   HEMORRHOIDECTOMY WITH HEMORRHOID BANDING     KNEE ARTHROSCOPY Right    KNEE ARTHROSCOPY WITH MEDIAL MENISECTOMY Right 08/03/2018   Procedure: RIGHT KNEE ARTHROSCOPY WITH PARTIAL MEDIAL MENISCECTOMY;  Surgeon: Jerri Kay HERO, MD;  Location: [facility];  Service: Orthopedics;  Laterality: Right;   KNEE SURGERY     left   LASIK     POLYPECTOMY  08/13/2016   Procedure: POLYPECTOMY;  Surgeon: Claudis RAYMOND Rivet, MD;  [facility];  Service: Endoscopy;;  colon   POLYPECTOMY  12/22/2022   Procedure: POLYPECTOMY;  Surgeon: Eartha Angelia Sieving, MD;  [facility];  Service: Gastroenterology;;   POLYPECTOMY  05/14/2023   Procedure: POLYPECTOMY;  Surgeon: Cinderella Deatrice FALCON, MD;  [facility];  Service: Endoscopy;;   PVI  01/24/2010   afib ablation   RIGHT/LEFT HEART CATH AND CORONARY ANGIOGRAPHY N/A 08/18/2023   Procedure: RIGHT/LEFT HEART CATH AND CORONARY ANGIOGRAPHY;  Surgeon: Verlin Lonni BIRCH, MD;  [facility];  Service: Cardiovascular;  Laterality: N/A;   TRANSFORAMINAL LUMBAR INTERBODY FUSION (TLIF) WITH PEDICLE SCREW FIXATION 1 LEVEL N/A 05/06/2021   Procedure: Transforaminal Lumbar Interbody Fusion  - Thoracic  twelve-Lumbar one fixation Thoracic nine to Lumbar one with methacrylate augmentation with mazor;  Surgeon: Gens Victory, MD;  Location: [facility];  Service: Neurosurgery;  Laterality: N/A;    Current Outpatient Medications  Medication Sig Dispense Refill   amLODipine  (NORVASC ) 10 MG tablet Take 1 tablet (10 mg total) by mouth daily. 90 tablet 1   clopidogrel  (PLAVIX ) 75 MG tablet Take 1 tablet (75 mg total) by mouth daily. 90 tablet 3   dofetilide  (TIKOSYN ) 250 MCG capsule Take 1 capsule (250 mcg total) by mouth 2 (two) times daily. 60 capsule 6   ELIQUIS  5 MG TABS tablet TAKE 1 TABLET BY MOUTH TWICE A DAY. STOP PRADAXA  60 tablet 5   latanoprost  (XALATAN ) 0.005 % ophthalmic solution Place 1 drop into both eyes at bedtime.     metoprolol  succinate (TOPROL -XL) 25 MG 24 hr tablet TAKE 1 TABLET BY MOUTH TWICE A DAY 180 tablet 2   nitroGLYCERIN  (NITROSTAT ) 0.4 MG SL tablet Place 1 tablet (0.4 mg total) under  the tongue every 5 (five) minutes as needed for chest pain. 25 tablet 3   Oxycodone  HCl 10 MG TABS 1 tablet taken 3 or 4 times daily as needed for pain caution drowsiness 120 tablet 0   pantoprazole  (PROTONIX ) 40 MG tablet TAKE 1 TABLET BY MOUTH EVERY DAY BEFORE BREAKFAST 90 tablet 1   polyethylene glycol (MIRALAX  / GLYCOLAX ) 17 g packet Take 17 g by mouth at bedtime.     pravastatin  (PRAVACHOL ) 80 MG tablet Take 1 tablet (80 mg total) by mouth daily. 90 tablet 1   psyllium (METAMUCIL SMOOTH TEXTURE) 58.6 % powder Take 1 packet by mouth at bedtime.     ramipril  (ALTACE ) 10 MG capsule Take 10 mg by mouth 2 (two) times daily.     tamsulosin  (FLOMAX ) 0.4 MG CAPS capsule TAKE 1 CAPSULE BY MOUTH 2 TIMES DAILY. 180 capsule 3   XDEMVY  0.25 % SOLN Place 1 drop into both eyes daily as needed (irritation/inflammation).     No current facility-administered medications for this visit.   Allergies:  Sectral [acebutolol hcl], Sulfonamide derivatives, and Zocor [simvastatin]   Social History: The patient   reports that he quit smoking about 54 years ago. His smoking use included cigarettes. He started smoking about 57 years ago. He has a 0.8 pack-year smoking history. He has been exposed to tobacco smoke. He has never used smokeless tobacco. He reports that he does not currently use alcohol  after a past usage of about 14.0 standard drinks of alcohol  per week. He reports that he does not use drugs.   Family History: The patient's family history includes Colon cancer in his sister; Diabetes in his brother; Healthy in his daughter; Heart disease in his father and another family member; Hypertension in his mother and another family member; Transient ischemic attack in his mother.   ROS:  Please see the history of present illness. Otherwise, complete review of systems is positive for none.  All other systems are reviewed and negative.   Physical Exam: VS:  BP (!) 144/70 (BP Location: Left Arm, Cuff Size: Normal)   Pulse (!) 58   Ht 5' 4 (1.626 m)   Wt 186 lb 6.4 oz (84.6 kg)   SpO2 95%   BMI 32.00 kg/m , BMI Body mass index is 32 kg/m.  Wt Readings from Last 3 Encounters:  01/12/24 186 lb 6.4 oz (84.6 kg)  01/04/24 185 lb 6.5 oz (84.1 kg)  01/03/24 185 lb 6.4 oz (84.1 kg)    General: Patient appears comfortable at rest. HEENT: Conjunctiva and lids normal, oropharynx clear with moist mucosa. Neck: Supple, no elevated JVP or carotid bruits, no thyromegaly. Lungs: Clear to auscultation, nonlabored breathing at rest. Cardiac: Regular rate and rhythm, no S3 or significant systolic murmur, no pericardial rub. Abdomen: Soft, nontender, no hepatomegaly, bowel sounds present, no guarding or rebound. Extremities: No pitting edema, distal pulses 2+. Skin: Warm and dry. Musculoskeletal: No kyphosis. Neuropsychiatric: Alert and oriented x3, affect grossly appropriate.  ECG: NSR  Recent Labwork: 04/29/2023: BNP 321.6 07/14/2023: ALT 34; AST 33 08/12/2023: Platelets 208 08/18/2023: Hemoglobin  12.9 01/06/2024: BUN 26; Creatinine, Ser 1.12; Magnesium  2.0; Potassium 4.2; Sodium 139     Component Value Date/Time   CHOL 141 03/26/2023 0946   TRIG 113 03/26/2023 0946   HDL 40 03/26/2023 0946   CHOLHDL 3.5 03/26/2023 0946   CHOLHDL 2.9 03/16/2014 1219   VLDL 12 03/16/2014 1219   LDLCALC 80 03/26/2023 0946      Assessment and  Plan:  DOE: Continues to have DOE that significantly improved after PCI and initiation of Tikosyn .  Imaging reviewed, it showed peripheral interstitial lung disease.  PFTs from 2023 reviewed, he has mild diffusion defect.  I will defer this to his PCP.  CAD manifested by SIHD s/p OM PCI in April 2025 with residual mild to moderate nonobstructive disease in the RCA: No interval angina.  DOE significant improved after PCI but still has some DOE.  Not on aspirin  due to Eliquis  use, continue Plavix  75 mg once daily.  Continue pravastatin  80 mg nightly.  Persistent A-fib: EKG a few days ago showed normal sinus rhythm with frequent PACs.  He underwent PVI in 2011 with no recurrence until recently and was symptomatic with SOB.  He underwent Tikosyn  loading, recently discharged from the hospital a few days ago.  Continue metoprolol  succinate 25 mg twice daily, Tikosyn  to 50 mcg twice daily, Eliquis  5 mg twice daily.  Mild aortic valve stenosis: Echocardiogram from 2023 and 2025 showed mild aortic valve stenosis.  Repeat echocardiogram in 3 years.  HTN, controlled: Continue current antihypertensives.  Continue amlodipine  10 mg once daily, metoprolol  succinate 25 mg twice daily, ramipril  10 mg twice daily.  HLD, partially at goal: LDL 80 in 2024.  Continue atorvastatin 80 mg nightly.  Goal LDL less than 55 due to severe total plaque volume.  Will need to recheck lipids.   30 minutes spent in review the prior records/imaging/notes, more than 3 labs, discussion of CAD, A-fib, mild aortic valve stenosis, HTN with the patient and documentation.   Medication  Adjustments/Labs and Tests Ordered: Current medicines are reviewed at length with the patient today.  Concerns regarding medicines are outlined above.   Tests Ordered: No orders of the defined types were placed in this encounter.   Medication Changes: No orders of the defined types were placed in this encounter.   Disposition:  Follow up 6 months  Signed, Kaisley Stiverson Arleta Maywood, MD, 01/12/2024 1:52 PM    [facility] [address] [place], [place] [address]

## 2024-01-12 NOTE — Patient Instructions (Signed)
 Medication Instructions:  Your physician recommends that you continue on your current medications as directed. Please refer to the Current Medication list given to you today.   Labwork: None today  Testing/Procedures: None today  Follow-Up: 6 months  Any Other Special Instructions Will Be Listed Below (If Applicable).  If you need a refill on your cardiac medications before your next appointment, please call your pharmacy.

## 2024-01-14 ENCOUNTER — Encounter (HOSPITAL_COMMUNITY): Payer: Self-pay | Admitting: Internal Medicine

## 2024-01-14 ENCOUNTER — Ambulatory Visit (HOSPITAL_COMMUNITY)
Admit: 2024-01-14 | Discharge: 2024-01-14 | Disposition: A | Discharge status: Home / Self Care | Attending: Internal Medicine | Admitting: Internal Medicine

## 2024-01-14 VITALS — BP 146/70 | HR 57 | Ht 64.0 in | Wt 186.0 lb

## 2024-01-14 DIAGNOSIS — Z5181 Encounter for therapeutic drug level monitoring: Secondary | ICD-10-CM | POA: Diagnosis not present

## 2024-01-14 DIAGNOSIS — Z79899 Other long term (current) drug therapy: Secondary | ICD-10-CM | POA: Diagnosis not present

## 2024-01-14 DIAGNOSIS — D6869 Other thrombophilia: Secondary | ICD-10-CM | POA: Diagnosis not present

## 2024-01-14 DIAGNOSIS — I4819 Other persistent atrial fibrillation: Secondary | ICD-10-CM | POA: Diagnosis not present

## 2024-01-14 MED ORDER — DOFETILIDE 250 MCG PO CAPS: 250.0000 ug | ORAL_CAPSULE | Freq: Two times a day (BID) | ORAL | 3 refills | Status: AC

## 2024-01-14 NOTE — Progress Notes (Signed)
 Primary Care Physician: Alphonsa Glendia LABOR, MD Primary Cardiologist: Diannah SHAUNNA Maywood, MD Electrophysiologist: Danelle Birmingham, MD  Referring Physician: Dr Birmingham Ellender [name] is a 83 y.o. male with a history of CAD, DVT/PE, HTN, HLD, atrial fibrillation who presents for follow up in the [facility].  The patient is s/p afib ablation with Dr Kelsie in 2011. He was seen by Dr Birmingham for recurrent afib and dofetilide  was recommended. Patient is on Eliquis  for stroke prevention.    On follow up 01/14/24, patient is here for 1 week Tikosyn  surveillance. S/p Tikosyn  load 8/18-21. Patient converted to NSR chemically and did not require cardioversion. Qtc remained stable and discharged on 250 mcg BID.   Today, he denies symptoms of palpitations, chest pain, shortness of breath, orthopnea, PND, lower extremity edema, dizziness, presyncope, syncope, snoring, daytime somnolence, bleeding, or neurologic sequela. The patient is tolerating medications without difficulties and is otherwise without complaint today.    Atrial Fibrillation Risk Factors:  he does not have symptoms or diagnosis of sleep apnea. he does not have a history of rheumatic fever.   Atrial Fibrillation Management history:  Previous antiarrhythmic drugs: tikosyn  Previous cardioversions: none Previous ablations: 01/24/10 Anticoagulation history: warfarin, Eliquis   ROS- All systems are reviewed and negative except as per the HPI above.  Past Medical History:  Diagnosis Date   Arthritis    Cancer (HCC)    skin   Carpal tunnel syndrome    Cataract    left eye   Chronic back pain    multiple back surgeries   Complication of anesthesia    pt states woke up during hemorroid surgery and stopped breathing during his back surgery   DVT (deep venous thrombosis) (HCC) 03/2007   left leg    Dysrhythmia    GERD (gastroesophageal reflux disease)    takes Protonix  daily   Glaucoma    History of colon  polyps    History of hyperglycemia    History of MRSA infection 1992   HTN (hypertension)    takes  Ramipril  daily   Hyperlipidemia    takes Pravastatin  3 times a week   Nocturia    Paroxysmal atrial fibrillation (HCC)    takes Pradaxa  daily, Chads2vasc score 2(07/11/14)   Pneumonia    many yrs ago   Pulmonary embolism (HCC) 2008    Current Outpatient Medications  Medication Sig Dispense Refill   amLODipine  (NORVASC ) 10 MG tablet Take 1 tablet (10 mg total) by mouth daily. 90 tablet 1   clopidogrel  (PLAVIX ) 75 MG tablet Take 1 tablet (75 mg total) by mouth daily. 90 tablet 3   ELIQUIS  5 MG TABS tablet TAKE 1 TABLET BY MOUTH TWICE A DAY. STOP PRADAXA  60 tablet 5   latanoprost  (XALATAN ) 0.005 % ophthalmic solution Place 1 drop into both eyes at bedtime.     metoprolol  succinate (TOPROL -XL) 25 MG 24 hr tablet TAKE 1 TABLET BY MOUTH TWICE A DAY 180 tablet 2   nitroGLYCERIN  (NITROSTAT ) 0.4 MG SL tablet Place 1 tablet (0.4 mg total) under the tongue every 5 (five) minutes as needed for chest pain. 25 tablet 3   Oxycodone  HCl 10 MG TABS 1 tablet taken 3 or 4 times daily as needed for pain caution drowsiness 120 tablet 0   pantoprazole  (PROTONIX ) 40 MG tablet TAKE 1 TABLET BY MOUTH EVERY DAY BEFORE BREAKFAST 90 tablet 1   polyethylene glycol (MIRALAX  / GLYCOLAX ) 17 g packet Take 17 g by  mouth at bedtime.     pravastatin  (PRAVACHOL ) 80 MG tablet Take 1 tablet (80 mg total) by mouth daily. 90 tablet 1   psyllium (METAMUCIL SMOOTH TEXTURE) 58.6 % powder Take 1 packet by mouth at bedtime.     ramipril  (ALTACE ) 10 MG capsule Take 10 mg by mouth 2 (two) times daily.     tamsulosin  (FLOMAX ) 0.4 MG CAPS capsule TAKE 1 CAPSULE BY MOUTH 2 TIMES DAILY. 180 capsule 3   XDEMVY  0.25 % SOLN Place 1 drop into both eyes daily as needed (irritation/inflammation).     dofetilide  (TIKOSYN ) 250 MCG capsule Take 1 capsule (250 mcg total) by mouth 2 (two) times daily. 180 capsule 3   No current  facility-administered medications for this encounter.    Physical Exam: BP (!) 146/70   Pulse (!) 57   Ht 5' 4 (1.626 m)   Wt 84.4 kg   BMI 31.93 kg/m   GEN- The patient is well appearing, alert and oriented x 3 today.   Neck - no JVD or carotid bruit noted Lungs- Clear to ausculation bilaterally, normal work of breathing Heart- Regular rate and rhythm, no murmurs, rubs or gallops, PMI not laterally displaced Extremities- no clubbing, cyanosis, or edema Skin - no rash or ecchymosis noted   Wt Readings from Last 3 Encounters:  01/14/24 84.4 kg  01/12/24 84.6 kg  01/04/24 84.1 kg     EKG today demonstrates  Vent. rate 57 BPM PR interval 258 ms QRS duration 90 ms QT/QTcB 486/473 ms P-R-T axes 43 18 -27 Sinus bradycardia with 1st degree A-V block with Premature atrial complexes Septal infarct , age undetermined T wave abnormality, consider inferior ischemia Abnormal ECG When compared with ECG of 06-Jan-2024 10:12, No significant change was found   Echo 06/11/23 demonstrated   1. Left ventricular ejection fraction, by estimation, is 60 to 65%. The  left ventricle has normal function. The left ventricle has no regional  wall motion abnormalities. There is moderate left ventricular hypertrophy.  Left ventricular diastolic function could not be evaluated.   2. Right ventricular systolic function is low normal. The right  ventricular size is mildly enlarged. Tricuspid regurgitation signal is  inadequate for assessing PA pressure.   3. Left atrial size was severely dilated.   4. Right atrial size was severely dilated.   5. The mitral valve is normal in structure. Mild mitral valve  regurgitation. No evidence of mitral stenosis.   6. The aortic valve has an indeterminant number of cusps. There is  moderate calcification of the aortic valve. Aortic valve regurgitation is  trivial. Mild aortic valve stenosis. Aortic valve area, by VTI measures  1.96 cm. Aortic valve mean  gradient  measures 11.0 mmHg. Aortic valve Vmax measures 2.05 m/s.   7. The inferior vena cava is normal in size with greater than 50%  respiratory variability, suggesting right atrial pressure of 3 mmHg.     CHA2DS2-VASc Score = 3  The patient's score is based upon: CHF History: 0 HTN History: 1 Diabetes History: 0 Stroke History: 0 Vascular Disease History: 0 Age Score: 2 Gender Score: 0       ASSESSMENT AND PLAN: Persistent Atrial Fibrillation (ICD10:  I48.19) The patient's CHA2DS2-VASc score is 3, indicating a 3.2% annual risk of stroke.   S/p Tikosyn  admission 8/18-21/2025.  Patient appears to be maintaining normal rhythm.   High risk medication monitoring (ICD10: Z79.899) Patient requires ongoing monitoring for anti-arrhythmic medication which has the potential to cause life  threatening arrhythmias or AV block. Qtc stable. Continue Tikosyn  250 mcg BID. Bmet and mag drawn today.   Secondary Hypercoagulable State (ICD10:  D68.69) The patient is at significant risk for stroke/thromboembolism based upon his CHA2DS2-VASc Score of 3.  Continue Apixaban  (Eliquis ).  Continue OAC.  CAD No anginal symptoms.  Followed by Dr Stacia  HTN Stable today.    Follow up in 1 month for Tikosyn  surveillance.     Dorn Heinrich, [facility] [address] [place], [place] [address] [phone]

## 2024-01-15 LAB — BASIC METABOLIC PANEL WITH GFR
BUN/Creatinine Ratio: 14 (ref 10–24)
BUN: 15 mg/dL (ref 8–27)
CO2: 22 mmol/L (ref 20–29)
Calcium: 9.3 mg/dL (ref 8.6–10.2)
Chloride: 105 mmol/L (ref 96–106)
Creatinine, Ser: 1.07 mg/dL (ref 0.76–1.27)
Glucose: 108 mg/dL — ABNORMAL HIGH (ref 70–99)
Potassium: 4.5 mmol/L (ref 3.5–5.2)
Sodium: 142 mmol/L (ref 134–144)
eGFR: 69 mL/min/1.73 (ref >59)

## 2024-01-15 LAB — MAGNESIUM: Magnesium: 2 mg/dL (ref 1.6–2.3)

## 2024-01-18 ENCOUNTER — Ambulatory Visit (HOSPITAL_COMMUNITY): Payer: Self-pay | Admitting: Internal Medicine

## 2024-01-18 ENCOUNTER — Encounter (HOSPITAL_COMMUNITY): Admitting: Physician Assistant

## 2024-01-18 ENCOUNTER — Ambulatory Visit: Payer: Self-pay

## 2024-01-18 NOTE — Telephone Encounter (Signed)
 FYI Only or Action Required?: FYI only for provider.  Patient is followed in Pulmonology for DOE, last seen on 08/16/2023 by [name] Ozell NOVAK, MD.  Called Nurse Triage reporting Shortness of Breath, Hospitalization Follow-up, and denies worsening of symptoms since hospital.  Symptoms began several weeks ago.  Interventions attempted: Other: hospital admission 8/18-8/21, a-fib meds to try to improve SOB.  Symptoms are: unchanged.  Triage Disposition: See PCP When Office is Open (Within 3 Days)  Patient/caregiver understands and will follow disposition?: Yes       Copied from CRM #[id number]. Topic: Clinical - Red Word Triage >> Jan 18, 2024 11:51 AM [name] wrote: [facility] that prompted transfer to Nurse Triage: Patient is not able to catch his breath at night. Reason for Disposition  [1] MODERATE longstanding difficulty breathing (e.g., speaks in phrases, SOB even at rest, pulse 100-120) AND [2] SAME as normal  Answer Assessment - Initial Assessment Questions Pt daughter [name] calling for pt, not with pt, limited info  E2C2 Pulmonary Triage - Initial Assessment Questions Chief Complaint (e.g., cough, sob, wheezing, fever, chills, sweat or additional symptoms) *Go to specific symptom protocol after initial questions. Not worse, still SOB only at night when laying down, will wake up with SOB and sit in chair then can catch breath If up and moving during day then loses breath and energy real quick Had thought a-fib med would help No energy No chest pain, dizziness, not weaker than usual Don't think coughing at all Don't think trouble talking  How long have symptoms been present? Ongoing for past 3 weeks, hospital 8/18-8/21, not worsened since discharge  Have you used your inhalers/maintenance medication? No If yes, What medications? Not that I know of, not using albuterol  inhaler or nebulizer  OXYGEN: Do you wear supplemental oxygen? No  Do you monitor your  oxygen levels? Yes If yes, What is your reading (oxygen level) today? 90-something  What is your usual oxygen saturation reading?  (Note: Pulmonary O2 sats should be 90% or greater) Don't know  6. CARDIAC HISTORY: Do you have any history of heart disease? (e.g., heart attack, angina, bypass surgery, angioplasty)      A-fib hospital admission 8/18-8/21 7. LUNG HISTORY: Do you have any history of lung disease?  (e.g., pulmonary embolus, asthma, emphysema)     Hx blood clot in legs or lungs, takes blood thinners 9. OTHER SYMPTOMS: Do you have any other symptoms? (e.g., chest pain, cough, dizziness, fever, runny nose)     No fever, runny nose, etc.   Not life or death, have to do another day this week    Advised pt be examined in next 4 hours since pt still acute after hospital admission though not worsened, pt daughter refusing appt today, scheduled first available appt at preferred pulm location. Advised call back or seek immediate care if worsening or new symptoms, pt daughter stated intent to do so as needed.  Protocols used: Breathing Difficulty-A-AH

## 2024-01-20 ENCOUNTER — Emergency Department (HOSPITAL_COMMUNITY)
Admission: EM | Admit: 2024-01-20 | Discharge: 2024-01-20 | Disposition: A | Discharge status: Home / Self Care | Attending: Emergency Medicine | Admitting: Emergency Medicine

## 2024-01-20 ENCOUNTER — Encounter (HOSPITAL_COMMUNITY): Payer: Self-pay

## 2024-01-20 ENCOUNTER — Other Ambulatory Visit: Payer: Self-pay

## 2024-01-20 ENCOUNTER — Ambulatory Visit: Payer: Self-pay

## 2024-01-20 ENCOUNTER — Emergency Department (HOSPITAL_COMMUNITY)

## 2024-01-20 DIAGNOSIS — R0602 Shortness of breath: Secondary | ICD-10-CM | POA: Diagnosis not present

## 2024-01-20 DIAGNOSIS — Z7901 Long term (current) use of anticoagulants: Secondary | ICD-10-CM | POA: Insufficient documentation

## 2024-01-20 DIAGNOSIS — I2510 Atherosclerotic heart disease of native coronary artery without angina pectoris: Secondary | ICD-10-CM | POA: Insufficient documentation

## 2024-01-20 DIAGNOSIS — Z86711 Personal history of pulmonary embolism: Secondary | ICD-10-CM | POA: Diagnosis not present

## 2024-01-20 DIAGNOSIS — I4891 Unspecified atrial fibrillation: Secondary | ICD-10-CM | POA: Diagnosis not present

## 2024-01-20 DIAGNOSIS — R0600 Dyspnea, unspecified: Secondary | ICD-10-CM | POA: Insufficient documentation

## 2024-01-20 DIAGNOSIS — R9389 Abnormal findings on diagnostic imaging of other specified body structures: Secondary | ICD-10-CM | POA: Diagnosis not present

## 2024-01-20 DIAGNOSIS — R918 Other nonspecific abnormal finding of lung field: Secondary | ICD-10-CM | POA: Diagnosis not present

## 2024-01-20 LAB — COMPREHENSIVE METABOLIC PANEL WITH GFR
ALT: 28 U/L (ref 0–44)
AST: 28 U/L (ref 15–41)
Albumin: 4.3 g/dL (ref 3.5–5.0)
Alkaline Phosphatase: 91 U/L (ref 38–126)
Anion gap: 13 (ref 5–15)
BUN: 19 mg/dL (ref 8–23)
CO2: 20 mmol/L — ABNORMAL LOW (ref 22–32)
Calcium: 9.3 mg/dL (ref 8.9–10.3)
Chloride: 107 mmol/L (ref 98–111)
Creatinine, Ser: 0.96 mg/dL (ref 0.61–1.24)
GFR, Estimated: >60 mL/min (ref >60)
Glucose, Bld: 116 mg/dL — ABNORMAL HIGH (ref 70–99)
Potassium: 4 mmol/L (ref 3.5–5.1)
Sodium: 140 mmol/L (ref 135–145)
Total Bilirubin: 1 mg/dL (ref 0.0–1.2)
Total Protein: 7.3 g/dL (ref 6.5–8.1)

## 2024-01-20 LAB — CBC WITH DIFFERENTIAL/PLATELET
Abs Immature Granulocytes: 0.02 K/uL (ref 0.00–0.07)
Basophils Absolute: 0.1 K/uL (ref 0.0–0.1)
Basophils Relative: 1 %
Eosinophils Absolute: 0.1 K/uL (ref 0.0–0.5)
Eosinophils Relative: 2 %
HCT: 38.8 % — ABNORMAL LOW (ref 39.0–52.0)
Hemoglobin: 13.2 g/dL (ref 13.0–17.0)
Immature Granulocytes: 0 %
Lymphocytes Relative: 11 %
Lymphs Abs: 0.6 K/uL — ABNORMAL LOW (ref 0.7–4.0)
MCH: 31.3 pg (ref 26.0–34.0)
MCHC: 34 g/dL (ref 30.0–36.0)
MCV: 91.9 fL (ref 80.0–100.0)
Monocytes Absolute: 0.6 K/uL (ref 0.1–1.0)
Monocytes Relative: 10 %
Neutro Abs: 4.3 K/uL (ref 1.7–7.7)
Neutrophils Relative %: 76 %
Platelets: 231 K/uL (ref 150–400)
RBC: 4.22 MIL/uL (ref 4.22–5.81)
RDW: 13.7 % (ref 11.5–15.5)
WBC: 5.7 K/uL (ref 4.0–10.5)
nRBC: 0 % (ref 0.0–0.2)

## 2024-01-20 LAB — POC OCCULT BLOOD, ED: Fecal Occult Blood: NEGATIVE

## 2024-01-20 LAB — BRAIN NATRIURETIC PEPTIDE: B Natriuretic Peptide: 480 pg/mL — ABNORMAL HIGH (ref 0.0–100.0)

## 2024-01-20 NOTE — ED Triage Notes (Signed)
 Pt arrived via POV c/o on-going SOB for past 6 months and reports yesterday he began spitting and coughing up blood. Pt reports this continued today making him want to be evaluated.

## 2024-01-20 NOTE — ED Provider Notes (Signed)
 [place] EMERGENCY DEPARTMENT AT [facility] Provider Note   CSN: [id number] Arrival date & time: 01/20/24  1235     Patient presents with: Shortness of Breath   [name] is a 83 y.o. male.  Presents to the ER complaining of spitting some blood up in the sink once yesterday and once today.  He says it was very dark looking today was more bright red color but had occurred just after eating a tomato sandwich..  He does take Eliquis  for A-fib and is on Plavix  status post stent.  Denies blood in the stool, states he was not coughing before this started, just blocked by the sink and spit in it.  Black or bloody stool. He has chronic shortness of breath has been ongoing for the past 6 months and was improved recently after his real change in his medicine.  He denies any lower extremity swelling, no pleurisy.  He is compliant with his Eliquis .  He denies chest pain or pleurisy.  He does not smoke.  Due to show he has been diagnosed with mild interstitial lung disease as well    Shortness of Breath      Prior to Admission medications   Medication Sig Start Date End Date Taking? Authorizing Provider  amLODipine  (NORVASC ) 10 MG tablet Take 1 tablet (10 mg total) by mouth daily. 09/08/23  Yes Alphonsa Glendia LABOR, MD  clopidogrel  (PLAVIX ) 75 MG tablet Take 1 tablet (75 mg total) by mouth daily. 09/15/23  Yes Strader, [place] M, PA-C  dofetilide  (TIKOSYN ) 250 MCG capsule Take 1 capsule (250 mcg total) by mouth 2 (two) times daily. 01/14/24  Yes Terra Fairy PARAS, PA-C  ELIQUIS  5 MG TABS tablet TAKE 1 TABLET BY MOUTH TWICE A DAY. STOP PRADAXA  10/18/23  Yes Luking, Glendia LABOR, MD  latanoprost  (XALATAN ) 0.005 % ophthalmic solution Place 1 drop into both eyes at bedtime. 07/11/22  Yes [provider]  metoprolol  succinate (TOPROL -XL) 25 MG 24 hr tablet TAKE 1 TABLET BY MOUTH TWICE A DAY Patient taking differently: Take 25 mg by mouth daily. 07/05/23  Yes Mallipeddi, Vishnu P, MD   nitroGLYCERIN  (NITROSTAT ) 0.4 MG SL tablet Place 1 tablet (0.4 mg total) under the tongue every 5 (five) minutes as needed for chest pain. 08/18/23 08/17/24 Yes Duke, Jon Garre, PA  Oxycodone  HCl 10 MG TABS 1 tablet taken 3 or 4 times daily as needed for pain caution drowsiness Patient taking differently: Take 1 tablet by mouth in the morning, at noon, in the evening, and at bedtime. 1 tablet taken 3 or 4 times daily as needed for pain caution drowsiness 12/15/23  Yes Luking, Scott A, MD  pantoprazole  (PROTONIX ) 40 MG tablet TAKE 1 TABLET BY MOUTH EVERY DAY BEFORE BREAKFAST 12/22/23  Yes Luking, Scott A, MD  polyethylene glycol (MIRALAX  / GLYCOLAX ) 17 g packet Take 17 g by mouth at bedtime.   Yes [provider]  pravastatin  (PRAVACHOL ) 80 MG tablet Take 1 tablet (80 mg total) by mouth daily. 09/08/23  Yes Luking, Scott A, MD  psyllium (METAMUCIL SMOOTH TEXTURE) 58.6 % powder Take 1 packet by mouth at bedtime. 01/14/21  Yes Rehman, Claudis PENNER, MD  ramipril  (ALTACE ) 10 MG capsule Take 10 mg by mouth 2 (two) times daily.   Yes [provider]  tamsulosin  (FLOMAX ) 0.4 MG CAPS capsule TAKE 1 CAPSULE BY MOUTH 2 TIMES DAILY. Patient taking differently: Take 0.4 mg by mouth daily. 12/05/23  Yes Watt Rush, MD  Allergies: Sectral [acebutolol hcl], Sulfonamide derivatives, and Zocor [simvastatin]    Review of Systems  Respiratory:  Positive for shortness of breath.     Updated Vital Signs BP (!) 160/82   Pulse (!) 56   Temp 97.7 F (36.5 C) (Oral)   Resp (!) 23   Ht 5' 4 (1.626 m)   Wt 84.3 kg   SpO2 92%   BMI 31.90 kg/m   Physical Exam Vitals and nursing note reviewed.  Constitutional:      General: He is not in acute distress.    Appearance: He is well-developed.  HENT:     Head: Normocephalic and atraumatic.  Eyes:     Extraocular Movements: Extraocular movements intact.     Conjunctiva/sclera: Conjunctivae normal.     Pupils: Pupils are equal, round, and reactive to  light.  Cardiovascular:     Rate and Rhythm: Normal rate and regular rhythm.     Heart sounds: No murmur heard. Pulmonary:     Effort: Pulmonary effort is normal. No tachypnea or respiratory distress.     Breath sounds: Normal breath sounds.  Abdominal:     Palpations: Abdomen is soft.     Tenderness: There is no abdominal tenderness.     Comments: Exam chaperoned by PA student Addison Folta, rectum is normal, stool is brown, Hemoccult negative  Musculoskeletal:        General: No swelling. Normal range of motion.     Cervical back: Neck supple.     Right lower leg: No tenderness. No edema.     Left lower leg: No tenderness. No edema.  Skin:    General: Skin is warm and dry.     Capillary Refill: Capillary refill takes less than 2 seconds.  Neurological:     General: No focal deficit present.     Mental Status: He is alert and oriented to person, place, and time.  Psychiatric:        Mood and Affect: Mood normal.     (all labs ordered are listed, but only abnormal results are displayed) Labs Reviewed  COMPREHENSIVE METABOLIC PANEL WITH GFR - Abnormal; Notable for the following components:      Result Value   CO2 20 (*)    Glucose, Bld 116 (*)    All other components within normal limits  CBC WITH DIFFERENTIAL/PLATELET - Abnormal; Notable for the following components:   HCT 38.8 (*)    Lymphs Abs 0.6 (*)    All other components within normal limits  BRAIN NATRIURETIC PEPTIDE - Abnormal; Notable for the following components:   B Natriuretic Peptide 480.0 (*)    All other components within normal limits  POC OCCULT BLOOD, ED    EKG: EKG Interpretation Date/Time:  Thursday January 20 2024 13:27:59 EDT Ventricular Rate:  61 PR Interval:  249 QRS Duration:  100 QT Interval:  485 QTC Calculation: 477 R Axis:   44  Text Interpretation: Sinus rhythm Atrial premature complex Prolonged PR interval Low voltage, extremity and precordial leads Anteroseptal infarct, old  Nonspecific T abnormalities, inferior leads Confirmed by Garrick Charleston [phone]) on 01/20/2024 1:36:19 PM  Radiology: [facility] Chest Portable 1 View Result Date: 01/20/2024 CLINICAL DATA:  Shortness of breath, possible hemoptysis. EXAM: PORTABLE CHEST 1 VIEW COMPARISON:  July 14, 2023. FINDINGS: Stable cardiomediastinal silhouette. Elevated right hemidiaphragm is noted with minimal right basilar subsegmental atelectasis or scarring. Left lung is clear. Postsurgical changes are seen involving lower thoracic spine. IMPRESSION: Minimal right basilar subsegmental atelectasis  or scarring. Electronically Signed   By: Lynwood Landy Raddle M.D.   On: 01/20/2024 14:06     Procedures   Medications Ordered in the ED - No data to display                                  Medical Decision Making This patient presents to the ED for concern of possible hemoptysis once yesterday and once today, this involves an extensive number of treatment options, and is a complaint that carries with it a high risk of complications and morbidity.  The differential diagnosis includes PE, pneumonia, GI bleeding, intraoral bleeding, other   Co morbidities that complicate the patient evaluation :   A-fib, pulmonary fibrosis, PE on Eliquis , CAD Plavix  post stent   Additional history obtained:  Additional history obtained from EMR External records from outside source obtained and reviewed including prior notes and labs   Lab Tests:  I Ordered, and personally interpreted labs.  The pertinent results include: BNP is 480, CMP normal, CBC with no leukocytosis or anemia   Imaging Studies ordered:  I ordered imaging studies including x-ray which shows more right basilar subsegmental atelectasis or scarring, no infiltrate or pulmonary edema I independently visualized and interpreted imaging within scope of identifying emergent findings  I agree with the radiologist interpretation   Cardiac Monitoring: / EKG:  The patient was  maintained on a cardiac monitor.  I personally viewed and interpreted the cardiac monitored which showed an underlying rhythm of: Sinus rhythm, nonspecific inferior T wave changes   Consultations Obtained:  Patient with also evaluated by ED attending Dr. Garrick in the emergency department   Problem List / ED Course / Critical interventions / Medication management  Patient thought he spit out some blood yesterday and today, he has not been having cough or increase in his baseline shortness of breath.  No signs of volume overload, he is not sure if it was bloody or not but states it was dark yesterday and today was slightly more reddish color but he had just eaten a tomato sandwich, he is on blood thinners, no obvious bleeding in his mouth, he was unsure if it would have come from his lungs or his stomach or in his mouth.  He is not coughing prior to this. Considered PE but patient is compliant on blood thinners, not having increased shortness of breath, no chest pain or pleurisy.  Given his blood thinner use I feel this is very unlikely, chest x-ray negative for infiltrate.  As a consider GI bleeding and did Hemoccult test which was negative for blood and patient denies any black or bloody stool.  Stool is light brown. Patient is not anemic, given his very stable symptoms with the use stable for discharge to follow-up closely with his PCP and follow-up outpatient with cardiology.    Is not clear if patient had spit up blood or just sputum that had been collected by food.  Patient agreeable with plan of care and discharge.  I have reviewed the patients home medicines and have made adjustments as needed      Amount and/or Complexity of Data Reviewed Labs: ordered. Radiology: ordered.        Final diagnoses:  Dyspnea, unspecified type    ED Discharge Orders     None          Suellen Sherran LABOR, PA-C 01/21/24 0009    Garrick,  Lamar, MD 01/22/24 1655

## 2024-01-20 NOTE — Discharge Instructions (Signed)
 To ER for possibly spitting up some blood today.  We did not find an obvious cause of bleeding, your workup was reassuring.  Follow-up as scheduled with your cardiology for your chronic shortness of breath and follow-up with your PCP.  If you develop any new or worsening symptoms or have any other abnormal bleeding, cough up frank blood please come back to the ER right away.

## 2024-01-20 NOTE — Telephone Encounter (Signed)
 FYI Only or Action Required?: FYI only for provider.  Patient was last seen in primary care on 12/15/2023 by Alphonsa Glendia LABOR, MD.  Called Nurse Triage reporting Chills and Hemoptysis.  Symptoms began yesterday.  Interventions attempted: Prescription medications: Tikosyn , Eliquis , plavix .  Symptoms are: coughed up big wad of black tarry blood, SOB at night when lying flat or during the day with exertion unchanged.  Triage Disposition: Go to ED Now (Notify PCP)  Patient/caregiver understands and will follow disposition?: Unsure           Copied from CRM [id number]. Topic: Clinical - Red Word Triage >> Jan 20, 2024  9:51 AM [name] wrote: Red Word that prompted transfer to Nurse Triage: patient started taking dofetilide  (TIKOSYN ) 250 MCG capsule [[id number]] for about two weeks. Yesterday and today he was throwing up blood. His temp is cold. Reason for Disposition  History of prior blood clot in leg or lungs (i.e., deep vein thrombosis, pulmonary embolism)  Answer Assessment - Initial Assessment Questions 1. APPEARANCE of BLOOD: What does the blood look like? (e.g., pink, red blood, coffee-grounds)     Black tar.  2. AMOUNT: How much blood was lost? (e.g., few streaks or strands, tablespoon, cup)     Big wad, more than just a little bit. She states it is not really vomiting, more like coughing.  3. VOMITING BLOOD: How many times did it happen? or How many times in the past 24 hours?     Twice.  4. VOMITING WITHOUT BLOOD: How many times in the past 24 hours?      No.  5. ONSET: When did vomiting of blood begin?     Yesterday.  6. CAUSE: What do you think is causing the vomiting of blood?     Patient states Tikosyn  2 weeks ago, patient has not been feeling well since starting it. He feels cold/chills since starting it.  7. BLOOD THINNERS: Do you take any blood thinners? (e.g., Coumadin /warfarin, Pradaxa /dabigatran , aspirin )     Plavix , Eliquis .  8.  DEHYDRATION: Are there any signs of dehydration? When was the last time you urinated? Do you feel dizzy?     She states he is eating and drinking okay.  9. ABDOMEN PAIN: Are you having any abdomen pain? If Yes, ask: What does it feel like? (e.g., crampy, dull, intermittent, constant)      No.  10. DIARRHEA: Is there any diarrhea? If Yes, ask: How many times today?        No.  11. OTHER SYMPTOMS: Do you have any other symptoms? (e.g., fever, blood in stool)       Denies chest pain. She states he has SOB at night time which has been ongoing since before he started Tikosyn .  12. PREGNANCY: Is there any chance you are pregnant? When was your last menstrual period?       N/A.  Answer Assessment - Initial Assessment Questions 1. ONSET: When did the cough begin?      Yesterday.  2. SEVERITY: How bad is the cough today? Did the blood appear after a coughing spell?      She states he was complaining he had something in his throat he was trying to clear and after coughing/clearing it the big wad of  black tarry blood appeared.  3. SPUTUM: Describe the color of your sputum (e.g., none, dry cough; clear, white, yellow, green)     Black and tarry.  4. HEMOPTYSIS: How much blood? (e.g., flecks, streaks, tablespoons)  Big wad.  5. DIFFICULTY BREATHING: Are you having difficulty breathing? If Yes, ask: How bad is it? (e.g., mild, moderate, severe)      SOB at night when lying down, has to sit up (been ongoing more than a couple of weeks). He told family he only feels SOB during the day if he is exerting himself like lifting something heavy.  6. FEVER: Do you have a fever? If Yes, ask: What is your temperature, how was it measured, and when did it start?     No fever, patient is running cold.  7. CARDIAC HISTORY: Do you have any history of heart disease? (e.g., heart attack, congestive heart failure)      A fib, cardiac stents.  8. LUNG HISTORY: Do you  have any history of lung disease?  (e.g., pulmonary embolus, asthma, emphysema)     Unsure.  9. PE RISK FACTORS: Do you have a history of blood clots? Note: Other risk factors include recent major surgery, recent prolonged travel, being bedridden.     Yes, DVT in his leg.  10. OTHER SYMPTOMS: Do you have any other symptoms? (e.g., runny nose, wheezing, chest pain)       No chest pain or abdominal pain.  11. PREGNANCY: Is there any chance you are pregnant? When was your last menstrual period?       N/a.  12. TRAVEL: Have you traveled out of the country in the last month? (e.g., travel history, exposures)       N/A.  Protocols used: Vomiting Blood-A-AH, Coughing Up Blood-A-AH

## 2024-01-23 NOTE — Progress Notes (Unsigned)
 TRAXTON KOLENDA, male    DOB: 1941/01/11    MRN: [id number]   Brief patient profile:  44  yowm  quit smoking 1970  referred to pulmonary clinic in [place]  08/16/2023 by Dayna Dunn for  doe onset around 2023 - PFTs wnl   02/18/22      History of Present Illness  08/16/2023  Pulmonary/ 1st office eval/ Trebor Galdamez / [facility]  Chief Complaint  Patient presents with   Establish Care   Shortness of Breath  Dyspnea:  100 ft slt uphill s classic angina  Cough: none  Sleep: now sleeps raised up with 2 pillows a wedge where use to sleep flat / prefers L side down/ but still ends up in recliner   SABA use: none  02: none  Needing lozenges in church and having problem with allergies first time in my life this spring  Rec If you don't breathe better after your procedure, I need to see you back here to consider substituting Valsartan  in place of altace  (or your cardiologist can do this)   Pulmonary follow up is as needed    01/24/2024  f/u ov/[facility]/Dunia Pringle re: doe  maint on no resp cc   Chief Complaint  Patient presents with   Shortness of Breath    Hos. F/u   Dyspnea:  walked across parking lot and into office s difficulty  House to shop is 150 ft stops half way flat surface Cough: none still clearing throat some  Sleeping: flat   2 pillows  s  resp cc and and can't breath due to wheezing ? hb SABA use: not helpful  02: none    No obvious day to day or daytime variability or assoc excess/ purulent sputum or mucus plugs or hemoptysis or cp or chest tightness,  or overt sinus  symptoms.    Also denies any obvious fluctuation of symptoms with weather or environmental changes or other aggravating or alleviating factors except as outlined above   No unusual exposure hx or h/o childhood pna/ asthma or knowledge of premature birth.  Current Allergies, Complete Past Medical History, Past Surgical History, Family History, and Social History were reviewed in [facility] record.  ROS  The following are not active complaints unless bolded Hoarseness, sore throat, dysphagia, dental problems, itching, sneezing,  nasal congestion or discharge of excess mucus or purulent secretions, ear ache,   fever, chills, sweats, unintended wt loss or wt gain, classically pleuritic or exertional cp,  orthopnea pnd or arm/hand swelling  or leg swelling, presyncope, palpitations, abdominal pain, anorexia, nausea, vomiting, diarrhea  or change in bowel habits or change in bladder habits, change in stools or change in urine, dysuria, hematuria,  rash, arthralgias, visual complaints, headache, numbness, weakness or ataxia or problems with walking uses cane at relatively fast pace  or coordination,  change in mood or  memory.        Current Meds  Medication Sig   amLODipine  (NORVASC ) 10 MG tablet Take 1 tablet (10 mg total) by mouth daily.   clopidogrel  (PLAVIX ) 75 MG tablet Take 1 tablet (75 mg total) by mouth daily.   dofetilide  (TIKOSYN ) 250 MCG capsule Take 1 capsule (250 mcg total) by mouth 2 (two) times daily.   ELIQUIS  5 MG TABS tablet TAKE 1 TABLET BY MOUTH TWICE A DAY. STOP PRADAXA    latanoprost  (XALATAN ) 0.005 % ophthalmic solution Place 1 drop into both eyes at bedtime.   metoprolol  succinate (TOPROL -XL) 25  MG 24 hr tablet TAKE 1 TABLET BY MOUTH TWICE A DAY (Patient taking differently: Take 25 mg by mouth daily.)   nitroGLYCERIN  (NITROSTAT ) 0.4 MG SL tablet Place 1 tablet (0.4 mg total) under the tongue every 5 (five) minutes as needed for chest pain.   Oxycodone  HCl 10 MG TABS 1 tablet taken 3 or 4 times daily as needed for pain caution drowsiness (Patient taking differently: Take 1 tablet by mouth in the morning, at noon, in the evening, and at bedtime. 1 tablet taken 3 or 4 times daily as needed for pain caution drowsiness)   pantoprazole  (PROTONIX ) 40 MG tablet TAKE 1 TABLET BY MOUTH EVERY DAY BEFORE BREAKFAST   polyethylene glycol (MIRALAX  / GLYCOLAX ) 17  g packet Take 17 g by mouth at bedtime.   pravastatin  (PRAVACHOL ) 80 MG tablet Take 1 tablet (80 mg total) by mouth daily.   psyllium (METAMUCIL SMOOTH TEXTURE) 58.6 % powder Take 1 packet by mouth at bedtime.   ramipril  (ALTACE ) 10 MG capsule Take 10 mg by mouth 2 (two) times daily.   tamsulosin  (FLOMAX ) 0.4 MG CAPS capsule TAKE 1 CAPSULE BY MOUTH 2 TIMES DAILY. (Patient taking differently: Take 0.4 mg by mouth daily.)            Past Medical History:  Diagnosis Date   Arthritis    Cancer (HCC)    skin   Carpal tunnel syndrome    Cataract    left eye   Chronic back pain    multiple back surgeries   Complication of anesthesia    pt states woke up during hemorroid surgery and stopped breathing during his back surgery   DVT (deep venous thrombosis) (HCC) 03/2007   left leg    Dysrhythmia    GERD (gastroesophageal reflux disease)    takes Protonix  daily   Glaucoma    History of colon polyps    History of hyperglycemia    History of MRSA infection 1992   HTN (hypertension)    takes  Ramipril  daily   Hyperlipidemia    takes Pravastatin  3 times a week   Nocturia    Paroxysmal atrial fibrillation (HCC)    takes Pradaxa  daily, Chads2vasc score 2(07/11/14)   Pneumonia    many yrs ago   Pulmonary embolism (HCC) 2008      Objective:      Wt Readings from Last 3 Encounters:  01/24/24 184 lb (83.5 kg)  01/20/24 185 lb 13.6 oz (84.3 kg)  01/14/24 186 lb (84.4 kg)      Vital signs reviewed  01/24/2024  - Note at rest 02 sats  98% on RA   General appearance:    amb somber wm / nobody can find anything wrong with me    HEENT : Oropharynx  clear      Nasal turbinates nl    NECK :  without  apparent JVD/ palpable Nodes/TM    LUNGS: no acc muscle use,  Nl contour chest which is clear to A and P bilaterally without cough on insp or exp maneuvers   CV:  RRR  no s3 or murmur or increase in P2, and no edema   ABD:  soft and nontender   MS:  Gait nl   ext warm without  deformities Or obvious joint restrictions  calf tenderness, cyanosis or clubbing    SKIN: warm and dry without lesions    NEURO:  alert, approp, nl sensorium with  no motor or cerebellar deficits apparent.  I personally reviewed images and agree with radiology impression as follows:  CXR:   portable 01/20/24  Minimal right basilar subsegmental atelectasis or scarring.  My review: low lung volumes with mild / mod CM s chf      Assessment    Assessment & Plan DOE (dyspnea on exertion) Onset 2022-2023 ? P drank coffee too hot from [employer]  - PFTs wnl   02/18/22  - Echo 06/11/23   1. Left ventricular ejection fraction, by estimation, is 60 to 65%. The  left ventricle has normal function. The left ventricle has no regional  wall motion abnormalities. There is moderate left ventricular hypertrophy.   2. Right ventricular systolic function is low normal. The right  ventricular size is mildly enlarged. Tricuspid regurgitation signal is  inadequate for assessing PA pressure.   3. Left atrial size was severely dilated.   4. Right atrial size was severely dilated.   5. The mitral valve is normal in structure. Mild mitral valve  regurgitation. No evidence of mitral stenosis.   6. The aortic valve has an indeterminant number of cusps. There is  moderate calcification of the aortic valve. Aortic valve regurgitation is  trivial. Mild aortic valve stenosis.   7. The inferior vena cava is normal in size with greater than 50%  respiratory variability, suggesting right atrial pressure of 3 mmHg.  - 08/16/2023   Walked on RA  x  2  lap(s) =  approx 300  ft  @ slow/ cane pace, stopped due to fatigue > sob  with lowest 02 sats 96%   - Coronary arterial dz by non-invasive studies/ for heart cath 08/18/23 > Successful PTCA/DES x 1 obtuse marginal branch / nl R and L ht pressures - 01/24/2024   Walked on RA  x  3  lap(s) =  approx 450  ft  @ nl to fast cane  pace, stopped due to end of study  with lowest  02 sats 94% with mild sob    Symptoms are markedly disproportionate to objective findings and not clear to what extent this is actually a pulmonary  problem but pt does appear to have difficult to sort out respiratory symptoms of unknown origin for which  DDX  = almost all start with A and  include Adherence, Ace Inhibitors, Acid Reflux, Active Sinus Disease, Alpha 1 Antitripsin deficiency, Anxiety/depression deconditioning  masquerading as Airways dz,  ABPA,  Allergy(esp in young), Aspiration (esp in elderly), Adverse effects of meds,  Active smoking or Vaping, A bunch of PE's/clot burden (a few small clots can't cause this syndrome unless there is already severe underlying pulm or vascular dz with poor reserve),  Anemia or thyroid  disorder, plus two Bs  = Bronchiectasis and Beta blocker use..and one C= CHF    BOLDED dx most likely so try again off acei (see hbp) and max gerd rx and f/u in 6 weeks, sooner if needed, with all meds in hand using a trust but verify approach to confirm accurate Medication  Reconciliation The principal here is that until we are certain that the  patients are doing what we've asked, it makes no sense to ask them to do more.   HYPERTENSION, BENIGN D/c altace  01/24/2024 > 2nd attempt  Not clear he followed the first set of recs but he has multiple issues in his hx that never improve ever since an incident that set off upper airways inflammation in [employer] sev years ago and I can't really continue to work thru  the ddx of unexplained sob and cough in a pt on active ACEi.  I've offered to own the problem of bp management for next 6 weeks or let him see another provider here or in [place] for another opinion.      Total time for H and P, chart review, counseling,  directly observing portions of ambulatory 02 saturation study/ and generating customized AVS unique to this office visit / same day charting = 45 min   for multiple  refractory respiratory  symptoms of uncertain etiology                                  AVS  Patient Instructions  Stop altace  and start olmesartan  20 mg  twice daily - if not satisfied with results you can  call me if problems  Continue Pantoprazole  (protonix ) 40 mg   Take  30-60 min before first meal of the day and ADD Pepcid  (famotidine )  20 mg after supper until return to office - this is the best way to tell whether stomach acid is contributing to your problem.    SABRAGERD (REFLUX)  is an extremely common cause of respiratory symptoms just like yours , many times with no obvious heartburn at all.    It can be treated with medication, but also with lifestyle changes including elevation of the head of your bed (ideally with 6 -8inch blocks under the headboard of your bed),  Smoking cessation, avoidance of late meals, excessive alcohol , and avoid fatty foods, chocolate, peppermint, colas, red wine, and acidic juices such as orange juice.  NO MINT OR MENTHOL  PRODUCTS SO NO COUGH DROPS  USE SUGARLESS CANDY INSTEAD (Jolley ranchers or Stover's or Life Savers) or even ice chips will also do - the key is to swallow to prevent all throat clearing. NO OIL BASED VITAMINS - use powdered substitutes.  Avoid fish oil when coughing.    Please schedule a follow up office visit in 6 weeks, call sooner if needed with all medications /inhalers/ solutions in hand so we can verify exactly what you are taking. This includes all medications from all doctors and over the counters    Ozell America, MD 01/24/2024

## 2024-01-24 ENCOUNTER — Encounter: Payer: Self-pay | Admitting: Internal Medicine

## 2024-01-24 ENCOUNTER — Ambulatory Visit (INDEPENDENT_AMBULATORY_CARE_PROVIDER_SITE_OTHER): Admitting: Internal Medicine

## 2024-01-24 VITALS — BP 155/66 | HR 61 | Ht 64.0 in | Wt 184.0 lb

## 2024-01-24 DIAGNOSIS — F17210 Nicotine dependence, cigarettes, uncomplicated: Secondary | ICD-10-CM

## 2024-01-24 DIAGNOSIS — I10 Essential (primary) hypertension: Secondary | ICD-10-CM

## 2024-01-24 DIAGNOSIS — R0609 Other forms of dyspnea: Secondary | ICD-10-CM | POA: Diagnosis not present

## 2024-01-24 MED ORDER — FAMOTIDINE 20 MG PO TABS: ORAL_TABLET | ORAL | 11 refills | Status: AC

## 2024-01-24 MED ORDER — OLMESARTAN MEDOXOMIL 20 MG PO TABS: 20.0000 mg | ORAL_TABLET | Freq: Every day | ORAL | 11 refills | Status: DC

## 2024-01-24 NOTE — Assessment & Plan Note (Addendum)
 D/c altace  01/24/2024 > 2nd attempt  Not clear he followed the first set of recs but he has multiple issues in his hx that never improve ever since an incident that set off upper airways inflammation in [employer] sev years ago and I can't really continue to work thru the ddx of unexplained sob and cough in a pt on active ACEi.  I've offered to own the problem of bp management for next 6 weeks or let him see another provider here or in [place] for another opinion.      Total time for H and P, chart review, counseling,  directly observing portions of ambulatory 02 saturation study/ and generating customized AVS unique to this office visit / same day charting = 45 min   for multiple  refractory respiratory  symptoms of uncertain etiology

## 2024-01-24 NOTE — Assessment & Plan Note (Addendum)
 Onset 2022-2023 ? P drank coffee too hot from [employer]  - PFTs wnl   02/18/22  - Echo 06/11/23   1. Left ventricular ejection fraction, by estimation, is 60 to 65%. The  left ventricle has normal function. The left ventricle has no regional  wall motion abnormalities. There is moderate left ventricular hypertrophy.   2. Right ventricular systolic function is low normal. The right  ventricular size is mildly enlarged. Tricuspid regurgitation signal is  inadequate for assessing PA pressure.   3. Left atrial size was severely dilated.   4. Right atrial size was severely dilated.   5. The mitral valve is normal in structure. Mild mitral valve  regurgitation. No evidence of mitral stenosis.   6. The aortic valve has an indeterminant number of cusps. There is  moderate calcification of the aortic valve. Aortic valve regurgitation is  trivial. Mild aortic valve stenosis.   7. The inferior vena cava is normal in size with greater than 50%  respiratory variability, suggesting right atrial pressure of 3 mmHg.  - 08/16/2023   Walked on RA  x  2  lap(s) =  approx 300  ft  @ slow/ cane pace, stopped due to fatigue > sob  with lowest 02 sats 96%   - Coronary arterial dz by non-invasive studies/ for heart cath 08/18/23 > Successful PTCA/DES x 1 obtuse marginal branch / nl R and L ht pressures - 01/24/2024   Walked on RA  x  3  lap(s) =  approx 450  ft  @ nl to fast cane  pace, stopped due to end of study  with lowest 02 sats 94% with mild sob    Symptoms are markedly disproportionate to objective findings and not clear to what extent this is actually a pulmonary  problem but pt does appear to have difficult to sort out respiratory symptoms of unknown origin for which  DDX  = almost all start with A and  include Adherence, Ace Inhibitors, Acid Reflux, Active Sinus Disease, Alpha 1 Antitripsin deficiency, Anxiety/depression deconditioning  masquerading as Airways dz,  ABPA,  Allergy(esp in young), Aspiration (esp in  elderly), Adverse effects of meds,  Active smoking or Vaping, A bunch of PE's/clot burden (a few small clots can't cause this syndrome unless there is already severe underlying pulm or vascular dz with poor reserve),  Anemia or thyroid  disorder, plus two Bs  = Bronchiectasis and Beta blocker use..and one C= CHF    BOLDED dx most likely so try again off acei (see hbp) and max gerd rx and f/u in 6 weeks, sooner if needed, with all meds in hand using a trust but verify approach to confirm accurate Medication  Reconciliation The principal here is that until we are certain that the  patients are doing what we've asked, it makes no sense to ask them to do more.

## 2024-01-24 NOTE — Patient Instructions (Addendum)
 Stop altace  and start olmesartan  20 mg  twice daily - if not satisfied with results you can  call me if problems  Continue Pantoprazole  (protonix ) 40 mg   Take  30-60 min before first meal of the day and ADD Pepcid  (famotidine )  20 mg after supper until return to office - this is the best way to tell whether stomach acid is contributing to your problem.    SABRAGERD (REFLUX)  is an extremely common cause of respiratory symptoms just like yours , many times with no obvious heartburn at all.    It can be treated with medication, but also with lifestyle changes including elevation of the head of your bed (ideally with 6 -8inch blocks under the headboard of your bed),  Smoking cessation, avoidance of late meals, excessive alcohol , and avoid fatty foods, chocolate, peppermint, colas, red wine, and acidic juices such as orange juice.  NO MINT OR MENTHOL  PRODUCTS SO NO COUGH DROPS  USE SUGARLESS CANDY INSTEAD (Jolley ranchers or Stover's or Life Savers) or even ice chips will also do - the key is to swallow to prevent all throat clearing. NO OIL BASED VITAMINS - use powdered substitutes.  Avoid fish oil when coughing.    Please schedule a follow up office visit in 6 weeks, call sooner if needed with all medications /inhalers/ solutions in hand so we can verify exactly what you are taking. This includes all medications from all doctors and over the counters

## 2024-02-14 ENCOUNTER — Ambulatory Visit (HOSPITAL_COMMUNITY)
Admission: RE | Admit: 2024-02-14 | Discharge: 2024-02-14 | Disposition: A | Discharge status: Home / Self Care | Source: Ambulatory Visit | Attending: Physician Assistant | Admitting: Physician Assistant

## 2024-02-14 VITALS — BP 150/68 | HR 58 | Ht 64.0 in | Wt 183.6 lb

## 2024-02-14 DIAGNOSIS — Z79899 Other long term (current) drug therapy: Secondary | ICD-10-CM

## 2024-02-14 DIAGNOSIS — I4891 Unspecified atrial fibrillation: Secondary | ICD-10-CM

## 2024-02-14 DIAGNOSIS — Z5181 Encounter for therapeutic drug level monitoring: Secondary | ICD-10-CM | POA: Diagnosis not present

## 2024-02-14 DIAGNOSIS — D6869 Other thrombophilia: Secondary | ICD-10-CM

## 2024-02-14 DIAGNOSIS — I4819 Other persistent atrial fibrillation: Secondary | ICD-10-CM | POA: Diagnosis not present

## 2024-02-14 NOTE — Progress Notes (Signed)
 Primary Care Physician: Alphonsa Glendia LABOR, MD Primary Cardiologist: Diannah SHAUNNA Maywood, MD Electrophysiologist: Danelle Birmingham, MD  Referring Physician: Dr Birmingham Ellender [name] is a 83 y.o. male with a history of CAD, DVT/PE, HTN, HLD, atrial fibrillation who presents for follow up in the [name].  The patient is s/p afib ablation with Dr Kelsie in 2011. He was seen by Dr Birmingham for recurrent afib and dofetilide  was recommended. Patient is on Eliquis  for stroke prevention. S/p Tikosyn  load 8/18-21/25. Patient converted to NSR chemically and did not require cardioversion.  Patient returns for follow up for atrial fibrillation and dofetilide  monitoring. He remains in SR today and feels well. He states that his SOB has improved. No bleeding issues on anticoagulation.   Today, he  denies symptoms of palpitations, chest pain, shortness of breath, orthopnea, PND, lower extremity edema, dizziness, presyncope, syncope, snoring, daytime somnolence, bleeding, or neurologic sequela. The patient is tolerating medications without difficulties and is otherwise without complaint today.    Atrial Fibrillation Risk Factors:  he does not have symptoms or diagnosis of sleep apnea. he does not have a history of rheumatic fever.   Atrial Fibrillation Management history:  Previous antiarrhythmic drugs: dofetilide   Previous cardioversions: none Previous ablations: 01/24/10 Anticoagulation history: warfarin, Eliquis   ROS- All systems are reviewed and negative except as per the HPI above.  Past Medical History:  Diagnosis Date   Arthritis    Cancer (HCC)    skin   Carpal tunnel syndrome    Cataract    left eye   Chronic back pain    multiple back surgeries   Complication of anesthesia    pt states woke up during hemorroid surgery and stopped breathing during his back surgery   DVT (deep venous thrombosis) (HCC) 03/2007   left leg    Dysrhythmia    GERD  (gastroesophageal reflux disease)    takes Protonix  daily   Glaucoma    History of colon polyps    History of hyperglycemia    History of MRSA infection 1992   HTN (hypertension)    takes  Ramipril  daily   Hyperlipidemia    takes Pravastatin  3 times a week   Nocturia    Paroxysmal atrial fibrillation (HCC)    takes Pradaxa  daily, Chads2vasc score 2(07/11/14)   Pneumonia    many yrs ago   Pulmonary embolism (HCC) 2008    Current Outpatient Medications  Medication Sig Dispense Refill   amLODipine  (NORVASC ) 10 MG tablet Take 1 tablet (10 mg total) by mouth daily. 90 tablet 1   clopidogrel  (PLAVIX ) 75 MG tablet Take 1 tablet (75 mg total) by mouth daily. 90 tablet 3   dofetilide  (TIKOSYN ) 250 MCG capsule Take 1 capsule (250 mcg total) by mouth 2 (two) times daily. 180 capsule 3   ELIQUIS  5 MG TABS tablet TAKE 1 TABLET BY MOUTH TWICE A DAY. STOP PRADAXA  60 tablet 5   famotidine  (PEPCID ) 20 MG tablet One after supper 30 tablet 11   latanoprost  (XALATAN ) 0.005 % ophthalmic solution Place 1 drop into both eyes at bedtime.     metoprolol  succinate (TOPROL -XL) 25 MG 24 hr tablet TAKE 1 TABLET BY MOUTH TWICE A DAY 180 tablet 2   nitroGLYCERIN  (NITROSTAT ) 0.4 MG SL tablet Place 1 tablet (0.4 mg total) under the tongue every 5 (five) minutes as needed for chest pain. 25 tablet 3   olmesartan  (BENICAR ) 20 MG tablet Take 1 tablet (20 mg  total) by mouth daily. 30 tablet 11   Oxycodone  HCl 10 MG TABS 1 tablet taken 3 or 4 times daily as needed for pain caution drowsiness (Patient taking differently: Take 1 tablet by mouth in the morning, at noon, in the evening, and at bedtime. 1 tablet taken 3 or 4 times daily as needed for pain caution drowsiness) 120 tablet 0   pantoprazole  (PROTONIX ) 40 MG tablet TAKE 1 TABLET BY MOUTH EVERY DAY BEFORE BREAKFAST 90 tablet 1   polyethylene glycol (MIRALAX  / GLYCOLAX ) 17 g packet Take 17 g by mouth at bedtime.     pravastatin  (PRAVACHOL ) 80 MG tablet Take 1 tablet  (80 mg total) by mouth daily. 90 tablet 1   psyllium (METAMUCIL SMOOTH TEXTURE) 58.6 % powder Take 1 packet by mouth at bedtime.     tamsulosin  (FLOMAX ) 0.4 MG CAPS capsule TAKE 1 CAPSULE BY MOUTH 2 TIMES DAILY. 180 capsule 3   No current facility-administered medications for this encounter.    Physical Exam: BP (!) 150/68   Pulse (!) 58   Ht 5' 4 (1.626 m)   Wt 84.3 kg   BMI 31.89 kg/m   GEN: Well nourished, well developed in no acute distress CARDIAC: Regular rate and rhythm, no murmurs, rubs, gallops RESPIRATORY:  Clear to auscultation without rales, wheezing or rhonchi  ABDOMEN: Soft, non-tender, non-distended EXTREMITIES:  No edema; No deformity    Wt Readings from Last 3 Encounters:  02/14/24 84.3 kg  01/24/24 83.5 kg  01/20/24 84.3 kg     EKG today demonstrates  SB, 1st degree AV block, PAC Vent. rate 58 BPM PR interval 250 ms QRS duration 92 ms QT/QTcB 472/463 ms   Echo 06/11/23 demonstrated   1. Left ventricular ejection fraction, by estimation, is 60 to 65%. The  left ventricle has normal function. The left ventricle has no regional  wall motion abnormalities. There is moderate left ventricular hypertrophy.  Left ventricular diastolic function could not be evaluated.   2. Right ventricular systolic function is low normal. The right  ventricular size is mildly enlarged. Tricuspid regurgitation signal is  inadequate for assessing PA pressure.   3. Left atrial size was severely dilated.   4. Right atrial size was severely dilated.   5. The mitral valve is normal in structure. Mild mitral valve  regurgitation. No evidence of mitral stenosis.   6. The aortic valve has an indeterminant number of cusps. There is  moderate calcification of the aortic valve. Aortic valve regurgitation is  trivial. Mild aortic valve stenosis. Aortic valve area, by VTI measures  1.96 cm. Aortic valve mean gradient  measures 11.0 mmHg. Aortic valve Vmax measures 2.05 m/s.   7. The  inferior vena cava is normal in size with greater than 50%  respiratory variability, suggesting right atrial pressure of 3 mmHg.    CHA2DS2-VASc Score = 3  The patient's score is based upon: CHF History: 0 HTN History: 1 Diabetes History: 0 Stroke History: 0 Vascular Disease History: 0 Age Score: 2 Gender Score: 0       ASSESSMENT AND PLAN: Persistent Atrial Fibrillation (ICD10:  I48.19) The patient's CHA2DS2-VASc score is 3, indicating a 3.2% annual risk of stroke.   S/p dofetilide  loading 12/2023 Patient appears to be maintaining SR Continue dofetilide  250 mcg BID Continue Eliquis  5 mg BID Continue Toprol  25 mg daily  Secondary Hypercoagulable State (ICD10:  D68.69) The patient is at significant risk for stroke/thromboembolism based upon his CHA2DS2-VASc Score of 3.  Continue  Apixaban  (Eliquis ). No bleeding issues.   High Risk Medication Monitoring (ICD 10: J342684) Patient requires ongoing monitoring for anti-arrhythmic medication which has the potential to cause life threatening arrhythmias. QT interval on ECG acceptable for dofetilide  monitoring. Check bmet/mag today.     CAD No anginal symptoms Followed by Dr Stacia  HTN Mildly elevated today but readings better at home. No changes today.    Follow up in the AF clinic in 3 months.     [name] [name] [address] [place], [place] [address] [phone]

## 2024-02-15 DIAGNOSIS — I4891 Unspecified atrial fibrillation: Secondary | ICD-10-CM | POA: Diagnosis not present

## 2024-02-15 DIAGNOSIS — E7849 Other hyperlipidemia: Secondary | ICD-10-CM | POA: Diagnosis not present

## 2024-02-15 DIAGNOSIS — Z79899 Other long term (current) drug therapy: Secondary | ICD-10-CM | POA: Diagnosis not present

## 2024-02-16 ENCOUNTER — Ambulatory Visit: Payer: Self-pay | Admitting: Family Medicine

## 2024-02-16 ENCOUNTER — Ambulatory Visit (HOSPITAL_COMMUNITY): Payer: Self-pay | Admitting: Physician Assistant

## 2024-02-16 LAB — LIPID PANEL
Chol/HDL Ratio: 3.7 ratio (ref 0.0–5.0)
Cholesterol, Total: 143 mg/dL (ref 100–199)
HDL: 39 mg/dL — ABNORMAL LOW (ref >39)
LDL Chol Calc (NIH): 91 mg/dL (ref 0–99)
Triglycerides: 65 mg/dL (ref 0–149)
VLDL Cholesterol Cal: 13 mg/dL (ref 5–40)

## 2024-02-16 LAB — BASIC METABOLIC PANEL WITH GFR
BUN/Creatinine Ratio: 18 (ref 10–24)
BUN/Creatinine Ratio: 18 (ref 10–24)
BUN: 18 mg/dL (ref 8–27)
BUN: 19 mg/dL (ref 8–27)
CO2: 22 mmol/L (ref 20–29)
CO2: 24 mmol/L (ref 20–29)
Calcium: 9.3 mg/dL (ref 8.6–10.2)
Calcium: 9.5 mg/dL (ref 8.6–10.2)
Chloride: 103 mmol/L (ref 96–106)
Chloride: 105 mmol/L (ref 96–106)
Creatinine, Ser: 1.02 mg/dL (ref 0.76–1.27)
Creatinine, Ser: 1.03 mg/dL (ref 0.76–1.27)
Glucose: 131 mg/dL — ABNORMAL HIGH (ref 70–99)
Glucose: 131 mg/dL — ABNORMAL HIGH (ref 70–99)
Potassium: 4.7 mmol/L (ref 3.5–5.2)
Potassium: 4.8 mmol/L (ref 3.5–5.2)
Sodium: 140 mmol/L (ref 134–144)
Sodium: 142 mmol/L (ref 134–144)
eGFR: 72 mL/min/1.73 (ref >59)
eGFR: 73 mL/min/1.73 (ref >59)

## 2024-02-16 LAB — HEPATIC FUNCTION PANEL
ALT: 27 IU/L (ref 0–44)
AST: 30 IU/L (ref 0–40)
Albumin: 4.7 g/dL (ref 3.7–4.7)
Alkaline Phosphatase: 106 IU/L (ref 48–129)
Bilirubin Total: 0.7 mg/dL (ref 0.0–1.2)
Bilirubin, Direct: 0.26 mg/dL (ref 0.00–0.40)
Total Protein: 6.8 g/dL (ref 6.0–8.5)

## 2024-02-16 LAB — MAGNESIUM: Magnesium: 1.9 mg/dL (ref 1.6–2.3)

## 2024-03-01 ENCOUNTER — Encounter (INDEPENDENT_AMBULATORY_CARE_PROVIDER_SITE_OTHER): Payer: Self-pay | Admitting: Gastroenterology

## 2024-03-06 ENCOUNTER — Ambulatory Visit: Admitting: Internal Medicine

## 2024-03-06 ENCOUNTER — Encounter: Payer: Self-pay | Admitting: Internal Medicine

## 2024-03-06 VITALS — BP 157/76 | HR 70 | Ht 64.0 in | Wt 182.6 lb

## 2024-03-06 DIAGNOSIS — I10 Essential (primary) hypertension: Secondary | ICD-10-CM

## 2024-03-06 DIAGNOSIS — R0609 Other forms of dyspnea: Secondary | ICD-10-CM

## 2024-03-06 DIAGNOSIS — R682 Dry mouth, unspecified: Secondary | ICD-10-CM | POA: Diagnosis not present

## 2024-03-06 MED ORDER — OLMESARTAN MEDOXOMIL 40 MG PO TABS: 40.0000 mg | ORAL_TABLET | Freq: Every day | ORAL | 11 refills | Status: AC

## 2024-03-06 NOTE — Progress Notes (Signed)
 [name], male    DOB: 03-Nov-1940    MRN: [id number]   Brief patient profile:  58  yowm  quit smoking 1970  referred to pulmonary clinic in [place]  08/16/2023 by Dayna Dunn for  doe onset around 2023 - PFTs wnl   02/18/22      History of Present Illness  08/16/2023  Pulmonary/ 1st office eval/ [name] / [place] [facility]  Chief Complaint  Patient presents with   Establish Care   Shortness of Breath  Dyspnea:  100 ft slt uphill s classic angina  Cough: none  Sleep: now sleeps raised up with 2 pillows a wedge where use to sleep flat / prefers L side down/ but still ends up in recliner   SABA use: none  02: none  Needing lozenges in church and having problem with allergies first time in my life this spring  Rec If you don't breathe better after your procedure, I need to see you back here to consider substituting Valsartan  in place of altace  (or your cardiologist can do this)   Pulmonary follow up is as needed    01/24/2024  f/u ov/[place] office/[name] re: doe  maint on no resp cc   Chief Complaint  Patient presents with   Shortness of Breath    Hos. F/u   Dyspnea:  walked across parking lot and into office s difficulty  House to shop is 150 ft stops half way flat surface Cough: none still clearing throat some  Sleeping: flat   2 pillows  s  resp cc and and can't breath due to wheezing ? hb SABA use: not helpful  02: none  Patient Instructions  Stop altace  and start olmesartan  20 mg  twice daily - if not satisfied with results you can  call me if problems Continue Pantoprazole  (protonix ) 40 mg   Take  30-60 min before first meal of the day and ADD Pepcid  (famotidine )  20 mg after supper until return to office - this is the best way to tell whether stomach acid is contributing to your problem.   GERD diet reviewed, bed blocks rec    03/06/2024  f/u ov/[place] office/[name] re: doe/? Ace[facility] case  maint on benicar  20   Chief Complaint  Patient presents with   Follow-up   Dyspnea:  better  Cough: none - just cc sensation of dry mouth x one year ? Related to burned mouth from coffee served too hot at [employer] per pet Sleeping: mostly in recliner  60 degrees x years  02: none      No obvious day to day or daytime variability or assoc excess/ purulent sputum or mucus plugs or hemoptysis or cp or chest tightness, subjective wheeze or overt sinus or hb symptoms.    Also denies any obvious fluctuation of symptoms with weather or environmental changes or other aggravating or alleviating factors except as outlined above   No unusual exposure hx or h/o childhood pna/ asthma or knowledge of premature birth.  Current Allergies, Complete Past Medical History, Past Surgical History, Family History, and Social History were reviewed in [employer] record.  ROS  The following are not active complaints unless bolded Hoarseness, sore throat, dysphagia, dental problems, itching, sneezing,  nasal congestion or discharge of excess mucus or purulent secretions, ear ache,   fever, chills, sweats, unintended wt loss or wt gain, classically pleuritic or exertional cp,  orthopnea pnd or arm/hand swelling  or leg swelling, presyncope, palpitations, abdominal pain,  anorexia, nausea, vomiting, diarrhea  or change in bowel habits or change in bladder habits, change in stools or change in urine, dysuria, hematuria,  rash, arthralgias, visual complaints, headache, numbness, weakness or ataxia or problems with walking or coordination,  change in mood or  memory.        Current Meds  Medication Sig   amLODipine  (NORVASC ) 10 MG tablet Take 1 tablet (10 mg total) by mouth daily.   clopidogrel  (PLAVIX ) 75 MG tablet Take 1 tablet (75 mg total) by mouth daily.   dofetilide  (TIKOSYN ) 250 MCG capsule Take 1 capsule (250 mcg total) by mouth 2 (two) times daily.   ELIQUIS  5 MG TABS tablet TAKE 1 TABLET BY MOUTH TWICE A DAY. STOP PRADAXA    famotidine  (PEPCID ) 20 MG tablet One  after supper   latanoprost  (XALATAN ) 0.005 % ophthalmic solution Place 1 drop into both eyes at bedtime.   metoprolol  succinate (TOPROL -XL) 25 MG 24 hr tablet TAKE 1 TABLET BY MOUTH TWICE A DAY   nitroGLYCERIN  (NITROSTAT ) 0.4 MG SL tablet Place 1 tablet (0.4 mg total) under the tongue every 5 (five) minutes as needed for chest pain.   olmesartan  (BENICAR ) 20 MG tablet Take 1 tablet (20 mg total) by mouth daily.   Oxycodone  HCl 10 MG TABS 1 tablet taken 3 or 4 times daily as needed for pain caution drowsiness (Patient taking differently: Take 1 tablet by mouth in the morning, at noon, in the evening, and at bedtime. 1 tablet taken 3 or 4 times daily as needed for pain caution drowsiness)   pantoprazole  (PROTONIX ) 40 MG tablet TAKE 1 TABLET BY MOUTH EVERY DAY BEFORE BREAKFAST   polyethylene glycol (MIRALAX  / GLYCOLAX ) 17 g packet Take 17 g by mouth at bedtime.   pravastatin  (PRAVACHOL ) 80 MG tablet Take 1 tablet (80 mg total) by mouth daily.   psyllium (METAMUCIL SMOOTH TEXTURE) 58.6 % powder Take 1 packet by mouth at bedtime.   tamsulosin  (FLOMAX ) 0.4 MG CAPS capsule TAKE 1 CAPSULE BY MOUTH 2 TIMES DAILY.             Past Medical History:  Diagnosis Date   Arthritis    Cancer (HCC)    skin   Carpal tunnel syndrome    Cataract    left eye   Chronic back pain    multiple back surgeries   Complication of anesthesia    pt states woke up during hemorroid surgery and stopped breathing during his back surgery   DVT (deep venous thrombosis) (HCC) 03/2007   left leg    Dysrhythmia    GERD (gastroesophageal reflux disease)    takes Protonix  daily   Glaucoma    History of colon polyps    History of hyperglycemia    History of MRSA infection 1992   HTN (hypertension)    takes  Ramipril  daily   Hyperlipidemia    takes Pravastatin  3 times a week   Nocturia    Paroxysmal atrial fibrillation (HCC)    takes Pradaxa  daily, Chads2vasc score 2(07/11/14)   Pneumonia    many yrs ago   Pulmonary  embolism (HCC) 2008      Objective:      03/06/2024     182   01/24/24 184 lb (83.5 kg)  01/20/24 185 lb 13.6 oz (84.3 kg)  01/14/24 186 lb (84.4 kg)     Vital signs reviewed  03/06/2024  - Note at rest 02 sats  94% on RA   General appearance:  amb wm / walks with cane and requires assistance to get on exam table   HEENT : Oropharynx  clear/ does not appear to be too dry      Nasal turbinates mild edema not speicific    NECK :  without  apparent JVD/ palpable Nodes/TM    LUNGS: no acc muscle use,  Nl contour chest which is clear to A and P bilaterally without cough on insp or exp maneuvers   CV:  RRR  no s3 or murmur or increase in P2, and no edema   ABD:  soft and nontender   MS:  Gait slow pace with cane, somewhat bent over at waist   ext warm without deformities Or obvious joint restrictions  calf tenderness, cyanosis or clubbing    SKIN: warm and dry without lesions    NEURO:  alert, approp, nl sensorium with  no motor or cerebellar deficits apparent.      Assessment    Assessment & Plan DOE (dyspnea on exertion) Onset 2022-2023 ? P drank coffee too hot from [employer]  - PFTs wnl   02/18/22  - Echo 06/11/23   1. Left ventricular ejection fraction, by estimation, is 60 to 65%. The  left ventricle has normal function. The left ventricle has no regional  wall motion abnormalities. There is moderate left ventricular hypertrophy.   2. Right ventricular systolic function is low normal. The right  ventricular size is mildly enlarged. Tricuspid regurgitation signal is  inadequate for assessing PA pressure.   3. Left atrial size was severely dilated.   4. Right atrial size was severely dilated.   5. The mitral valve is normal in structure. Mild mitral valve  regurgitation. No evidence of mitral stenosis.   6. The aortic valve has an indeterminant number of cusps. There is  moderate calcification of the aortic valve. Aortic valve regurgitation is  trivial. Mild  aortic valve stenosis.   7. The inferior vena cava is normal in size with greater than 50%  respiratory variability, suggesting right atrial pressure of 3 mmHg.  - 08/16/2023   Walked on RA  x  2  lap(s) =  approx 300  ft  @ slow/ cane pace, stopped due to fatigue > sob  with lowest 02 sats 96%   - Coronary arterial dz by non-invasive studies/ for heart cath 08/18/23 > Successful PTCA/DES x 1 obtuse marginal branch / nl R and L ht pressures - 01/24/2024   Walked on RA  x  3  lap(s) =  approx 450  ft  @ nl to fast cane  pace, stopped due to end of study  with lowest 02 sats 94% with mild sob    Improved to his satisfaction off ACEi > no f/u needed in pulmonary clinic at this point Dry mouth Onset ? When burned mouth and throat with hot coffee> better with hard rock candy   >>> ENT referral    Essential hypertension, benign D/c altace  01/24/2024 >  changed to benicar  but still too high 03/06/2024 on 20 mg daily  >>> rec benicar  20 mg bid until uses these up then change to benicar  40 mg one daily          Each maintenance medication was reviewed in detail including emphasizing most importantly the difference between maintenance and prns and under what circumstances the prns are to be triggered using an action plan format where appropriate.  Total time for H and P, chart review, counseling,   and  generating customized AVS unique to this office visit / same day charting = 32 min summary final f/u ov          AVS  Patient Instructions  Increase benicar  20 mg (olmesartan  is the generic)  to one twice daily and if BP is ok on this then use the 40 mg dose just once daily  when the 20 mg dose runs out     check back with Dr Alphonsa in next month as planned so he can take over the [facility]   My office will be contacting you by phone for referral to ENT re dry mouth  - if you don't hear back from my office within one week please call us  back or notify us  thru MyChart and we'll address it right  away.    If you are satisfied with your treatment plan,  let your doctor know and he/she can either refill your medications or you can return here when your prescription runs out.     If in any way you are not 100% satisfied,  please tell us .  If 100% better, tell your friends!  Pulmonary follow up is as needed             [name] America, MD 03/06/2024

## 2024-03-06 NOTE — Assessment & Plan Note (Addendum)
 Onset ? When burned mouth and throat with hot coffee> better with hard rock candy   >>> ENT referral

## 2024-03-06 NOTE — Patient Instructions (Addendum)
 Increase benicar  20 mg (olmesartan  is the generic)  to one twice daily and if BP is ok on this then use the 40 mg dose just once daily  when the 20 mg dose runs out     check back with Dr Alphonsa in next month as planned so he can take over the [facility]   My office will be contacting you by phone for referral to ENT re dry mouth  - if you don't hear back from my office within one week please call us  back or notify us  thru MyChart and we'll address it right away.    If you are satisfied with your treatment plan,  let your doctor know and he/she can either refill your medications or you can return here when your prescription runs out.     If in any way you are not 100% satisfied,  please tell us .  If 100% better, tell your friends!  Pulmonary follow up is as needed

## 2024-03-06 NOTE — Assessment & Plan Note (Addendum)
 Onset 2022-2023 ? P drank coffee too hot from [employer]  - PFTs wnl   02/18/22  - Echo 06/11/23   1. Left ventricular ejection fraction, by estimation, is 60 to 65%. The  left ventricle has normal function. The left ventricle has no regional  wall motion abnormalities. There is moderate left ventricular hypertrophy.   2. Right ventricular systolic function is low normal. The right  ventricular size is mildly enlarged. Tricuspid regurgitation signal is  inadequate for assessing PA pressure.   3. Left atrial size was severely dilated.   4. Right atrial size was severely dilated.   5. The mitral valve is normal in structure. Mild mitral valve  regurgitation. No evidence of mitral stenosis.   6. The aortic valve has an indeterminant number of cusps. There is  moderate calcification of the aortic valve. Aortic valve regurgitation is  trivial. Mild aortic valve stenosis.   7. The inferior vena cava is normal in size with greater than 50%  respiratory variability, suggesting right atrial pressure of 3 mmHg.  - 08/16/2023   Walked on RA  x  2  lap(s) =  approx 300  ft  @ slow/ cane pace, stopped due to fatigue > sob  with lowest 02 sats 96%   - Coronary arterial dz by non-invasive studies/ for heart cath 08/18/23 > Successful PTCA/DES x 1 obtuse marginal branch / nl R and L ht pressures - 01/24/2024   Walked on RA  x  3  lap(s) =  approx 450  ft  @ nl to fast cane  pace, stopped due to end of study  with lowest 02 sats 94% with mild sob    Improved to his satisfaction off ACEi > no f/u needed in pulmonary clinic at this point

## 2024-03-06 NOTE — Assessment & Plan Note (Addendum)
 D/c altace  01/24/2024 >  changed to benicar  but still too high 03/06/2024 on 20 mg daily  >>> rec benicar  20 mg bid until uses these up then change to benicar  40 mg one daily          Each maintenance medication was reviewed in detail including emphasizing most importantly the difference between maintenance and prns and under what circumstances the prns are to be triggered using an action plan format where appropriate.  Total time for H and P, chart review, counseling,   and generating customized AVS unique to this office visit / same day charting = 32 min summary final f/u ov

## 2024-03-07 ENCOUNTER — Encounter (INDEPENDENT_AMBULATORY_CARE_PROVIDER_SITE_OTHER): Payer: Self-pay

## 2024-03-16 ENCOUNTER — Ambulatory Visit: Admitting: Family Medicine

## 2024-03-16 VITALS — BP 148/74 | HR 55 | Temp 97.9°F | Ht 64.0 in | Wt 169.8 lb

## 2024-03-16 DIAGNOSIS — E538 Deficiency of other specified B group vitamins: Secondary | ICD-10-CM

## 2024-03-16 DIAGNOSIS — R739 Hyperglycemia, unspecified: Secondary | ICD-10-CM

## 2024-03-16 DIAGNOSIS — Z79899 Other long term (current) drug therapy: Secondary | ICD-10-CM | POA: Diagnosis not present

## 2024-03-16 DIAGNOSIS — R410 Disorientation, unspecified: Secondary | ICD-10-CM | POA: Diagnosis not present

## 2024-03-16 DIAGNOSIS — R413 Other amnesia: Secondary | ICD-10-CM

## 2024-03-16 DIAGNOSIS — Z23 Encounter for immunization: Secondary | ICD-10-CM | POA: Diagnosis not present

## 2024-03-16 MED ORDER — PRAVASTATIN SODIUM 80 MG PO TABS: 80.0000 mg | ORAL_TABLET | Freq: Every day | ORAL | 1 refills | Status: AC

## 2024-03-16 MED ORDER — OXYCODONE-ACETAMINOPHEN 10-325 MG PO TABS: ORAL_TABLET | ORAL | 0 refills | Status: DC

## 2024-03-16 MED ORDER — AMLODIPINE BESYLATE 10 MG PO TABS: 10.0000 mg | ORAL_TABLET | Freq: Every day | ORAL | 1 refills | Status: AC

## 2024-03-16 MED ORDER — APIXABAN 5 MG PO TABS: 5.0000 mg | ORAL_TABLET | Freq: Two times a day (BID) | ORAL | 5 refills | Status: AC

## 2024-03-16 NOTE — Progress Notes (Signed)
 Subjective:    Patient ID: [name], male    DOB: 1940/10/12, 83 y.o.   MRN: [id number]  HPI Patient is in room 5. This patient was seen today for chronic pain  The medication list was reviewed and updated.   Location of Pain for which the patient has been treated with regarding narcotics: Back pain  Onset of this pain: Present years   -Compliance with medication: Good compliance  - Number patient states they take daily: 4 tablets daily  -Reason for ongoing use of opioids throbbing aching without medication cannot take NSAIDs  What other measures have been tried outside of opioids NSAIDs Tylenol   In the ongoing specialists regarding this condition back specialist  -when was the last dose patient took?  Past 24 hours  The patient was advised the importance of maintaining medication and not using illegal substances with these.  Here for refills and follow up  The patient was educated that we can provide 3 monthly scripts for their medication, it is their responsibility to follow the instructions.  Side effects or complications from medications: Denies side effects  Patient is aware that pain medications are meant to minimize the severity of the pain to allow their pain levels to improve to allow for better function. They are aware of that pain medications cannot totally remove their pain.  Due for UDT ( at least once per year) (pain management contract is also completed at the time of the UDT): Yearly  Scale of 1 to 10 ( 1 is least 10 is most) Your pain level without the medicine: 9-10 Your pain level with medication 3-4  Scale 1 to 10 ( 1-helps very little, 10 helps very well) How well does your pain medication reduce your pain so you can function better through out the day?  7-8  Quality of the pain: Throbbing aching  Persistence of the pain: Present all time  Modifying factors: Worse with activity  Patient also having a hard time with his memory having a  lot of short-term memory issues forgetting things repeating things this is frustrating the patient daughter is also noticing it and she relates her concerns    Patient is here for pain management follow up.   Patient did not mention any concerns. Over all seemed pretty good   Review of Systems     Objective:   Physical Exam General-in no acute distress Eyes-no discharge Lungs-respiratory rate normal, CTA CV-no murmurs,RRR Extremities skin warm dry no edema Neuro grossly normal Behavior normal, alert        Assessment & Plan:  1. Poor short term memory Lab work ordered await results - TSH + free T4  2. High risk medication use May continue pain medicine The patient was seen in followup for chronic pain. A review over at their current pain status was discussed. Drug registry was checked. Prescriptions were given.  Regular follow-up recommended. Discussion was held regarding the importance of compliance with medication as well as pain medication contract.  Patient was informed that medication may cause drowsiness and should not be combined  with other medications/alcohol  or street drugs. If the patient feels medication is causing altered alertness then do not drive or operate dangerous equipment.  Should be noted that the patient appears to be meeting appropriate use of opioids and response.  Evidenced by improved function and decent pain control without significant side effects and no evidence of overt aberrancy issues.  Upon discussion with the patient today they understand  that opioid therapy is optional and they feel that the pain has been refractory to reasonable conservative measures and is significant and affecting quality of life enough to warrant ongoing therapy and wishes to continue opioids.  Refills were provided.  [place]  medical Board guidelines regarding the pain medicine has been reviewed.  CDC guidelines most updated 2022 has been reviewed by the  prescriber.  PDMP is checked on a regular basis yearly urine drug screen and pain management contract  Treatment plan for this patient includes #1-gentle stretching exercises as shown daily basis 2.  Mild strength exercises 3 times per week #3 continue pain medications #4 notify us  if any digression   3. Hyperglycemia (Primary) Check A1c - HgB A1c  4. B12 deficiency Check B12 to see if this may be affecting his memory aspect - Vitamin B12  5. Immunization due Vaccine ordered - Flu vaccine HIGH DOSE PF(Fluzone  Trivalent)  Patient would benefit from MRI of the brain to rule out the possibility of mini strokes because of short-term memory dysfunction and intermittent forgetfulness in addition to this patient was encouraged to do daily try daytime driving only

## 2024-03-17 LAB — TSH+FREE T4
Free T4: 1.46 ng/dL (ref 0.82–1.77)
TSH: 1.09 u[IU]/mL (ref 0.450–4.500)

## 2024-03-17 LAB — HEMOGLOBIN A1C
Est. average glucose Bld gHb Est-mCnc: 143 mg/dL
Hgb A1c MFr Bld: 6.6 % — ABNORMAL HIGH (ref 4.8–5.6)

## 2024-03-17 LAB — VITAMIN B12: Vitamin B-12: 359 pg/mL (ref 232–1245)

## 2024-03-19 ENCOUNTER — Ambulatory Visit: Payer: Self-pay | Admitting: Family Medicine

## 2024-03-21 ENCOUNTER — Other Ambulatory Visit: Payer: Self-pay | Admitting: Family Medicine

## 2024-03-21 ENCOUNTER — Telehealth: Payer: Self-pay | Admitting: Family Medicine

## 2024-03-21 MED ORDER — OXYCODONE HCL 10 MG PO TABS: ORAL_TABLET | ORAL | 0 refills | Status: AC

## 2024-03-21 NOTE — Telephone Encounter (Unsigned)
 Copied from CRM [id number]. Topic: Clinical - Prescription Issue >> Mar 21, 2024  5:49 PM Donee H wrote: Reason for CRM: Beverley a pharmacist from [facility] calling regarding medication Oxycodone  HCl 10 MG TABS. He states he just filled a prescriptionoxyCODONE-acetaminophen  (PERCOCET) 10-325 MG tablet   a couple of days ago for patient. Patient has not came to pick up medication as of yet so he is wanting to know if patient suppose to have both. Please follow back up with him at [phone]

## 2024-03-21 NOTE — Telephone Encounter (Signed)
 Please contact pharmacy to confirm most recent prescription of Oxycodone   Copied from CRM [id number]. Topic: Clinical - Prescription Issue >> Mar 21, 2024  5:49 PM Donee H wrote: Reason for CRM: [name] a pharmacist from [facility] calling regarding medication Oxycodone  HCl 10 MG TABS. He states he just filled a prescriptionoxyCODONE-acetaminophen  (PERCOCET) 10-325 MG tablet   a couple of days ago for patient. Patient has not came to pick up medication as of yet so he is wanting to know if patient suppose to have both. Please follow back up with him at [phone]

## 2024-03-22 ENCOUNTER — Other Ambulatory Visit: Payer: Self-pay | Admitting: Internal Medicine

## 2024-03-22 NOTE — Telephone Encounter (Signed)
 I called [facility] and let them know to discontinue percocet 10mg -325

## 2024-03-22 NOTE — Telephone Encounter (Signed)
 The patient prefers oxycodone  without the Tylenol  Please discontinue the other prescription-in other words discontinue Percocet 10 mg/325 The prescriptions I sent in last night of the plain oxycodone  are the correct prescriptions

## 2024-03-23 ENCOUNTER — Other Ambulatory Visit (HOSPITAL_COMMUNITY)

## 2024-03-27 ENCOUNTER — Other Ambulatory Visit: Payer: Self-pay | Admitting: Nurse Practitioner

## 2024-03-27 ENCOUNTER — Telehealth: Payer: Self-pay | Admitting: *Deleted

## 2024-03-27 DIAGNOSIS — K117 Disturbances of salivary secretion: Secondary | ICD-10-CM

## 2024-03-27 NOTE — Telephone Encounter (Signed)
 Referral reordered in EPIC- originally ordered by pulmonology

## 2024-03-27 NOTE — Telephone Encounter (Signed)
 Copied from CRM #[id number]. Topic: Referral - Request for Referral >> Mar 24, 2024  2:25 PM Hadassah PARAS wrote: Did the patient discuss referral with their provider in the last year? No (If No - schedule appointment) (If Yes - send message)  Appointment offered? No  Type of order/referral and detailed reason for visit: Pt's daughter is calling in to request to see a ENT as advised from pulmonary Dr. Darlean.  Preference of office, provider, location: none  If referral order, have you been seen by this specialty before? No (If Yes, this issue or another issue? When? Where?  Can we respond through MyChart? Yes

## 2024-04-03 DIAGNOSIS — L570 Actinic keratosis: Secondary | ICD-10-CM | POA: Diagnosis not present

## 2024-04-03 DIAGNOSIS — X32XXXD Exposure to sunlight, subsequent encounter: Secondary | ICD-10-CM | POA: Diagnosis not present

## 2024-04-03 DIAGNOSIS — C44519 Basal cell carcinoma of skin of other part of trunk: Secondary | ICD-10-CM | POA: Diagnosis not present

## 2024-04-03 DIAGNOSIS — C44319 Basal cell carcinoma of skin of other parts of face: Secondary | ICD-10-CM | POA: Diagnosis not present

## 2024-04-03 DIAGNOSIS — L821 Other seborrheic keratosis: Secondary | ICD-10-CM | POA: Diagnosis not present

## 2024-04-03 DIAGNOSIS — D225 Melanocytic nevi of trunk: Secondary | ICD-10-CM | POA: Diagnosis not present

## 2024-04-12 ENCOUNTER — Encounter (INDEPENDENT_AMBULATORY_CARE_PROVIDER_SITE_OTHER): Payer: Self-pay

## 2024-04-27 ENCOUNTER — Ambulatory Visit: Admitting: Family Medicine

## 2024-05-16 ENCOUNTER — Encounter (INDEPENDENT_AMBULATORY_CARE_PROVIDER_SITE_OTHER): Payer: Self-pay | Admitting: Physician Assistant

## 2024-05-16 ENCOUNTER — Ambulatory Visit (INDEPENDENT_AMBULATORY_CARE_PROVIDER_SITE_OTHER): Admitting: Physician Assistant

## 2024-05-16 VITALS — BP 154/75 | HR 54 | Temp 97.5°F | Ht 63.0 in | Wt 180.0 lb

## 2024-05-16 DIAGNOSIS — K117 Disturbances of salivary secretion: Secondary | ICD-10-CM | POA: Diagnosis not present
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
## 2024-05-16 NOTE — Progress Notes (Signed)
 Patient having BP managed. Patient took BP medication this morning at 8 am and takes medication at night.

## 2024-05-23 ENCOUNTER — Ambulatory Visit (HOSPITAL_COMMUNITY)
Admission: RE | Admit: 2024-05-23 | Discharge: 2024-05-23 | Disposition: A | Discharge status: Home / Self Care | Source: Ambulatory Visit | Attending: Physician Assistant | Admitting: Physician Assistant

## 2024-05-23 VITALS — BP 132/60 | HR 64 | Ht 63.0 in | Wt 183.8 lb

## 2024-05-23 DIAGNOSIS — Z5181 Encounter for therapeutic drug level monitoring: Secondary | ICD-10-CM

## 2024-05-23 DIAGNOSIS — D6869 Other thrombophilia: Secondary | ICD-10-CM

## 2024-05-23 DIAGNOSIS — I4891 Unspecified atrial fibrillation: Secondary | ICD-10-CM

## 2024-05-23 DIAGNOSIS — Z79899 Other long term (current) drug therapy: Secondary | ICD-10-CM | POA: Diagnosis not present

## 2024-05-23 DIAGNOSIS — I4819 Other persistent atrial fibrillation: Secondary | ICD-10-CM | POA: Diagnosis not present

## 2024-05-23 NOTE — Progress Notes (Signed)
 "   Primary Care Physician: Alphonsa Glendia LABOR, MD Primary Cardiologist: Diannah SHAUNNA Maywood, MD Electrophysiologist: Danelle Birmingham, MD  Referring Physician: Dr Birmingham Ellender [name] is a 84 y.o. male with a history of CAD, DVT/PE, HTN, HLD, atrial fibrillation who presents for follow up in the [facility].  The patient is s/p afib ablation with Dr Kelsie in 2011. He was seen by Dr Birmingham for recurrent afib and dofetilide  was recommended. Patient is on Eliquis  for stroke prevention. S/p Tikosyn  load 8/18-21/25. Patient converted to NSR chemically and did not require cardioversion.  Patient returns for follow up for atrial fibrillation and dofetilide  monitoring. He remains in SR and feels well from a cardiac standpoint. He denies any interim symptoms of afib. His primary concern is regarding chronic dry mouth. He has seen his PCP, pulmonology, and ENT for this. No bleeding issues on anticoagulation.   Today, he  denies symptoms of palpitations, chest pain, shortness of breath, orthopnea, PND, lower extremity edema, dizziness, presyncope, syncope, snoring, daytime somnolence, bleeding, or neurologic sequela. The patient is tolerating medications without difficulties and is otherwise without complaint today.    Atrial Fibrillation Risk Factors:  he does not have symptoms or diagnosis of sleep apnea. he does not have a history of rheumatic fever.   Atrial Fibrillation Management history:  Previous antiarrhythmic drugs: dofetilide   Previous cardioversions: none Previous ablations: 01/24/10 Anticoagulation history: warfarin, Eliquis   ROS- All systems are reviewed and negative except as per the HPI above.  Past Medical History:  Diagnosis Date   Arthritis    Cancer (HCC)    skin   Carpal tunnel syndrome    Cataract    left eye   Chronic back pain    multiple back surgeries   Complication of anesthesia    pt states woke up during hemorroid surgery and stopped  breathing during his back surgery   DVT (deep venous thrombosis) (HCC) 03/2007   left leg    Dysrhythmia    GERD (gastroesophageal reflux disease)    takes Protonix  daily   Glaucoma    History of colon polyps    History of hyperglycemia    History of MRSA infection 1992   HTN (hypertension)    takes  Ramipril  daily   Hyperlipidemia    takes Pravastatin  3 times a week   Nocturia    Paroxysmal atrial fibrillation (HCC)    takes Pradaxa  daily, Chads2vasc score 2(07/11/14)   Pneumonia    many yrs ago   Pulmonary embolism (HCC) 2008    Current Outpatient Medications  Medication Sig Dispense Refill   amLODipine  (NORVASC ) 10 MG tablet Take 1 tablet (10 mg total) by mouth daily. 90 tablet 1   apixaban  (ELIQUIS ) 5 MG TABS tablet Take 1 tablet (5 mg total) by mouth 2 (two) times daily. 60 tablet 5   clopidogrel  (PLAVIX ) 75 MG tablet Take 1 tablet (75 mg total) by mouth daily. 90 tablet 3   dofetilide  (TIKOSYN ) 250 MCG capsule Take 1 capsule (250 mcg total) by mouth 2 (two) times daily. 180 capsule 3   famotidine  (PEPCID ) 20 MG tablet One after supper (Patient taking differently: Take 20 mg by mouth at bedtime.) 30 tablet 11   latanoprost  (XALATAN ) 0.005 % ophthalmic solution Place 1 drop into both eyes at bedtime.     metoprolol  succinate (TOPROL -XL) 25 MG 24 hr tablet TAKE 1 TABLET BY MOUTH TWICE A DAY 180 tablet 3   nitroGLYCERIN  (NITROSTAT ) 0.4 MG  SL tablet Place 1 tablet (0.4 mg total) under the tongue every 5 (five) minutes as needed for chest pain. 25 tablet 3   olmesartan  (BENICAR ) 40 MG tablet Take 1 tablet (40 mg total) by mouth daily. 30 tablet 11   Oxycodone  HCl 10 MG TABS 1 tablet taken 3 or 4 times daily as needed for pain caution drowsiness 120 tablet 0   Oxycodone  HCl 10 MG TABS One qid prn pain 120 tablet 0   Oxycodone  HCl 10 MG TABS One qid prn pain 120 tablet 0   pantoprazole  (PROTONIX ) 40 MG tablet TAKE 1 TABLET BY MOUTH EVERY DAY BEFORE BREAKFAST 90 tablet 1    polyethylene glycol (MIRALAX  / GLYCOLAX ) 17 g packet Take 17 g by mouth at bedtime.     pravastatin  (PRAVACHOL ) 80 MG tablet Take 1 tablet (80 mg total) by mouth daily. 90 tablet 1   psyllium (METAMUCIL SMOOTH TEXTURE) 58.6 % powder Take 1 packet by mouth at bedtime.     tamsulosin  (FLOMAX ) 0.4 MG CAPS capsule TAKE 1 CAPSULE BY MOUTH 2 TIMES DAILY. 180 capsule 3   No current facility-administered medications for this encounter.    Physical Exam: BP 132/60   Pulse 64   Ht 5' 3 (1.6 m)   Wt 83.4 kg   BMI 32.56 kg/m   GEN: Well nourished, well developed in no acute distress CARDIAC: Regular rate and rhythm, no murmurs, rubs, gallops RESPIRATORY:  Clear to auscultation without rales, wheezing or rhonchi  ABDOMEN: Soft, non-tender, non-distended EXTREMITIES:  No edema; No deformity    Wt Readings from Last 3 Encounters:  05/23/24 83.4 kg  05/16/24 81.6 kg  03/16/24 77 kg     EKG Interpretation Date/Time:  Tuesday May 23 2024 13:43:09 EST Ventricular Rate:  64 PR Interval:  238 QRS Duration:  94 QT Interval:  464 QTC Calculation: 478 R Axis:   29  Text Interpretation: Sinus rhythm with 1st degree A-V block with Premature atrial complexes Septal infarct (cited on or before 14-Feb-2024) T wave abnormality, consider inferior ischemia Abnormal ECG When compared with ECG of 14-Feb-2024 13:59, No significant change was found Confirmed by Elanora Quin ([id number]) on 05/23/2024 1:52:13 PM    Echo 06/11/23 demonstrated   1. Left ventricular ejection fraction, by estimation, is 60 to 65%. The  left ventricle has normal function. The left ventricle has no regional  wall motion abnormalities. There is moderate left ventricular hypertrophy.  Left ventricular diastolic function could not be evaluated.   2. Right ventricular systolic function is low normal. The right  ventricular size is mildly enlarged. Tricuspid regurgitation signal is  inadequate for assessing PA pressure.   3. Left  atrial size was severely dilated.   4. Right atrial size was severely dilated.   5. The mitral valve is normal in structure. Mild mitral valve  regurgitation. No evidence of mitral stenosis.   6. The aortic valve has an indeterminant number of cusps. There is  moderate calcification of the aortic valve. Aortic valve regurgitation is  trivial. Mild aortic valve stenosis. Aortic valve area, by VTI measures  1.96 cm. Aortic valve mean gradient  measures 11.0 mmHg. Aortic valve Vmax measures 2.05 m/s.   7. The inferior vena cava is normal in size with greater than 50%  respiratory variability, suggesting right atrial pressure of 3 mmHg.    CHA2DS2-VASc Score = 3  The patient's score is based upon: CHF History: 0 HTN History: 1 Diabetes History: 0 Stroke History: 0 Vascular  Disease History: 0 Age Score: 2 Gender Score: 0       ASSESSMENT AND PLAN: Persistent Atrial Fibrillation (ICD10:  I48.19) The patient's CHA2DS2-VASc score is 3, indicating a 3.2% annual risk of stroke.   S/p dofetilide  loading 12/2023 Patient appears to be maintaining SR Continue dofetilide  250 mcg BID Continue Eliquis  5 mg BID Continue Toprol  25 mg daily  Secondary Hypercoagulable State (ICD10:  D68.69) The patient is at significant risk for stroke/thromboembolism based upon his CHA2DS2-VASc Score of 3.  Continue Apixaban  (Eliquis ). No bleeding issues.   High Risk Medication Monitoring (ICD 10: [id number]) Patient requires ongoing monitoring for anti-arrhythmic medication which has the potential to cause life threatening arrhythmias. QT interval on ECG acceptable for dofetilide  monitoring. Check bmet/mag today.     CAD No anginal symptoms Followed by Dr Stacia  HTN Stable on current regimen   Follow up with Dr Almetta as scheduled. AF clinic in 6 months.    [facility] [address] [place], [place] [address] [phone] "

## 2024-05-24 ENCOUNTER — Ambulatory Visit (HOSPITAL_COMMUNITY): Payer: Self-pay | Admitting: Physician Assistant

## 2024-05-24 LAB — BASIC METABOLIC PANEL WITH GFR
BUN/Creatinine Ratio: 12 (ref 10–24)
BUN: 14 mg/dL (ref 8–27)
CO2: 24 mmol/L (ref 20–29)
Calcium: 8.9 mg/dL (ref 8.6–10.2)
Chloride: 103 mmol/L (ref 96–106)
Creatinine, Ser: 1.13 mg/dL (ref 0.76–1.27)
Glucose: 122 mg/dL — ABNORMAL HIGH (ref 70–99)
Potassium: 4.5 mmol/L (ref 3.5–5.2)
Sodium: 140 mmol/L (ref 134–144)
eGFR: 64 mL/min/1.73

## 2024-05-24 LAB — MAGNESIUM: Magnesium: 1.9 mg/dL (ref 1.6–2.3)

## 2024-06-14 ENCOUNTER — Ambulatory Visit: Admitting: Family Medicine

## 2024-06-21 ENCOUNTER — Other Ambulatory Visit: Payer: Self-pay | Admitting: Family Medicine

## 2024-06-21 DIAGNOSIS — K219 Gastro-esophageal reflux disease without esophagitis: Secondary | ICD-10-CM

## 2024-06-27 ENCOUNTER — Ambulatory Visit: Admitting: Family Medicine

## 2024-07-14 ENCOUNTER — Ambulatory Visit: Admitting: Student in an Organized Health Care Education/Training Program

## 2024-11-21 ENCOUNTER — Ambulatory Visit (HOSPITAL_COMMUNITY): Admitting: Physician Assistant
